# Patient Record
Sex: Male | Born: 1960 | Race: White | Hispanic: No | Marital: Married | Smoking: Current every day smoker
Health system: Southern US, Community
[De-identification: ages and names within clinical notes are randomized; demographics above are authoritative.]

## PROBLEM LIST (undated history)

## (undated) DIAGNOSIS — R51 Headache: Secondary | ICD-10-CM

## (undated) DIAGNOSIS — M720 Palmar fascial fibromatosis [Dupuytren]: Secondary | ICD-10-CM

## (undated) DIAGNOSIS — M47812 Spondylosis without myelopathy or radiculopathy, cervical region: Secondary | ICD-10-CM

## (undated) DIAGNOSIS — I739 Peripheral vascular disease, unspecified: Secondary | ICD-10-CM

## (undated) DIAGNOSIS — E1169 Type 2 diabetes mellitus with other specified complication: Secondary | ICD-10-CM

## (undated) DIAGNOSIS — E440 Moderate protein-calorie malnutrition: Secondary | ICD-10-CM

## (undated) DIAGNOSIS — I6529 Occlusion and stenosis of unspecified carotid artery: Secondary | ICD-10-CM

## (undated) DIAGNOSIS — M797 Fibromyalgia: Secondary | ICD-10-CM

## (undated) DIAGNOSIS — M1811 Unilateral primary osteoarthritis of first carpometacarpal joint, right hand: Secondary | ICD-10-CM

## (undated) DIAGNOSIS — F4310 Post-traumatic stress disorder, unspecified: Secondary | ICD-10-CM

## (undated) DIAGNOSIS — G894 Chronic pain syndrome: Secondary | ICD-10-CM

## (undated) DIAGNOSIS — R413 Other amnesia: Secondary | ICD-10-CM

## (undated) DIAGNOSIS — Z7189 Other specified counseling: Secondary | ICD-10-CM

## (undated) DIAGNOSIS — J189 Pneumonia, unspecified organism: Secondary | ICD-10-CM

## (undated) DIAGNOSIS — S069X9A Unspecified intracranial injury with loss of consciousness of unspecified duration, initial encounter: Secondary | ICD-10-CM

## (undated) DIAGNOSIS — H269 Unspecified cataract: Secondary | ICD-10-CM

## (undated) DIAGNOSIS — C3490 Malignant neoplasm of unspecified part of unspecified bronchus or lung: Secondary | ICD-10-CM

## (undated) DIAGNOSIS — F323 Major depressive disorder, single episode, severe with psychotic features: Secondary | ICD-10-CM

## (undated) DIAGNOSIS — C171 Malignant neoplasm of jejunum: Secondary | ICD-10-CM

## (undated) DIAGNOSIS — Z5111 Encounter for antineoplastic chemotherapy: Secondary | ICD-10-CM

## (undated) DIAGNOSIS — F09 Unspecified mental disorder due to known physiological condition: Secondary | ICD-10-CM

## (undated) DIAGNOSIS — E1159 Type 2 diabetes mellitus with other circulatory complications: Secondary | ICD-10-CM

## (undated) DIAGNOSIS — K2270 Barrett's esophagus without dysplasia: Secondary | ICD-10-CM

## (undated) DIAGNOSIS — E785 Hyperlipidemia, unspecified: Secondary | ICD-10-CM

## (undated) DIAGNOSIS — Z720 Tobacco use: Secondary | ICD-10-CM

## (undated) DIAGNOSIS — N521 Erectile dysfunction due to diseases classified elsewhere: Secondary | ICD-10-CM

## (undated) DIAGNOSIS — R066 Hiccough: Secondary | ICD-10-CM

## (undated) DIAGNOSIS — R519 Headache, unspecified: Secondary | ICD-10-CM

## (undated) DIAGNOSIS — Z5112 Encounter for antineoplastic immunotherapy: Secondary | ICD-10-CM

## (undated) DIAGNOSIS — Z9289 Personal history of other medical treatment: Secondary | ICD-10-CM

## (undated) HISTORY — PX: FEMORAL ARTERY STENT: SHX1583

## (undated) HISTORY — PX: FRACTURE SURGERY: SHX138

## (undated) HISTORY — DX: Chronic pain syndrome: G89.4

## (undated) HISTORY — DX: Moderate protein-calorie malnutrition: E44.0

## (undated) HISTORY — DX: Type 2 diabetes mellitus with other circulatory complications: E11.59

## (undated) HISTORY — DX: Malignant neoplasm of unspecified part of unspecified bronchus or lung: C34.90

## (undated) HISTORY — DX: Unspecified intracranial injury with loss of consciousness of unspecified duration, initial encounter: S06.9X9A

## (undated) HISTORY — DX: Other specified counseling: Z71.89

## (undated) HISTORY — DX: Fibromyalgia: M79.7

## (undated) HISTORY — DX: Post-traumatic stress disorder, unspecified: F43.10

## (undated) HISTORY — DX: Malignant neoplasm of jejunum: C17.1

## (undated) HISTORY — DX: Unspecified cataract: H26.9

## (undated) HISTORY — DX: Type 2 diabetes mellitus with other specified complication: E11.69

## (undated) HISTORY — DX: Hiccough: R06.6

## (undated) HISTORY — DX: Spondylosis without myelopathy or radiculopathy, cervical region: M47.812

## (undated) HISTORY — DX: Palmar fascial fibromatosis (dupuytren): M72.0

## (undated) HISTORY — DX: Unilateral primary osteoarthritis of first carpometacarpal joint, right hand: M18.11

## (undated) HISTORY — DX: Unspecified mental disorder due to known physiological condition: F09

## (undated) HISTORY — DX: Occlusion and stenosis of unspecified carotid artery: I65.29

## (undated) HISTORY — PX: CAROTID STENT: SHX1301

## (undated) HISTORY — DX: Erectile dysfunction due to diseases classified elsewhere: N52.1

## (undated) HISTORY — DX: Encounter for antineoplastic chemotherapy: Z51.11

## (undated) HISTORY — DX: Peripheral vascular disease, unspecified: I73.9

## (undated) HISTORY — DX: Tobacco use: Z72.0

## (undated) HISTORY — DX: Encounter for antineoplastic immunotherapy: Z51.12

## (undated) HISTORY — DX: Hyperlipidemia, unspecified: E78.5

## (undated) HISTORY — DX: Major depressive disorder, single episode, severe with psychotic features: F32.3

## (undated) SURGERY — BRONCHOSCOPY, WITH FLUOROSCOPY
Anesthesia: Moderate Sedation

---

## 1994-01-21 HISTORY — PX: HERNIA REPAIR: SHX51

## 2004-11-20 ENCOUNTER — Encounter: Admission: RE | Admit: 2004-11-20 | Discharge: 2004-11-20 | Payer: Self-pay | Admitting: Rheumatology

## 2010-07-22 DIAGNOSIS — S069X9A Unspecified intracranial injury with loss of consciousness of unspecified duration, initial encounter: Secondary | ICD-10-CM

## 2010-07-22 DIAGNOSIS — S069X1A Unspecified intracranial injury with loss of consciousness of 30 minutes or less, initial encounter: Secondary | ICD-10-CM

## 2010-07-22 HISTORY — DX: Unspecified intracranial injury with loss of consciousness of unspecified duration, initial encounter: S06.9X9A

## 2010-07-22 HISTORY — DX: Unspecified intracranial injury with loss of consciousness of 30 minutes or less, initial encounter: S06.9X1A

## 2010-08-10 DIAGNOSIS — S069X9A Unspecified intracranial injury with loss of consciousness of unspecified duration, initial encounter: Secondary | ICD-10-CM | POA: Insufficient documentation

## 2010-08-15 HISTORY — PX: ORIF ORBITAL FRACTURE: SHX5312

## 2011-04-15 DIAGNOSIS — F09 Unspecified mental disorder due to known physiological condition: Secondary | ICD-10-CM

## 2011-04-15 DIAGNOSIS — F323 Major depressive disorder, single episode, severe with psychotic features: Secondary | ICD-10-CM

## 2011-04-15 HISTORY — DX: Major depressive disorder, single episode, severe with psychotic features: F32.3

## 2011-04-15 HISTORY — DX: Unspecified mental disorder due to known physiological condition: F09

## 2011-07-05 ENCOUNTER — Other Ambulatory Visit: Payer: Self-pay | Admitting: Neurology

## 2011-07-05 DIAGNOSIS — S0990XA Unspecified injury of head, initial encounter: Secondary | ICD-10-CM

## 2011-07-05 DIAGNOSIS — R51 Headache: Secondary | ICD-10-CM

## 2011-10-10 ENCOUNTER — Other Ambulatory Visit: Payer: Self-pay | Admitting: Neurology

## 2011-10-10 DIAGNOSIS — M542 Cervicalgia: Secondary | ICD-10-CM

## 2011-10-10 DIAGNOSIS — IMO0002 Reserved for concepts with insufficient information to code with codable children: Secondary | ICD-10-CM

## 2011-10-15 ENCOUNTER — Ambulatory Visit
Admission: RE | Admit: 2011-10-15 | Discharge: 2011-10-15 | Disposition: A | Discharge status: Home / Self Care | Payer: Worker's Compensation | Source: Ambulatory Visit | Attending: Neurology | Admitting: Neurology

## 2011-10-15 DIAGNOSIS — M542 Cervicalgia: Secondary | ICD-10-CM

## 2011-10-15 DIAGNOSIS — IMO0002 Reserved for concepts with insufficient information to code with codable children: Secondary | ICD-10-CM

## 2011-10-15 MED ORDER — IOHEXOL 300 MG/ML  SOLN
1.0000 mL | Freq: Once | INTRAMUSCULAR | Status: AC | PRN
  Administered 2011-10-15: 1 mL via EPIDURAL

## 2011-10-15 MED ORDER — TRIAMCINOLONE ACETONIDE 40 MG/ML IJ SUSP (RADIOLOGY)
60.0000 mg | Freq: Once | INTRAMUSCULAR | Status: AC
  Administered 2011-10-15: 60 mg via EPIDURAL

## 2011-11-15 HISTORY — PX: HARDWARE REMOVAL: SHX979

## 2013-05-26 ENCOUNTER — Emergency Department (HOSPITAL_COMMUNITY)
Admission: EM | Admit: 2013-05-26 | Discharge: 2013-05-26 | Disposition: A | Discharge status: Home / Self Care | Payer: Medicaid Other | Attending: Emergency Medicine | Admitting: Emergency Medicine

## 2013-05-26 ENCOUNTER — Encounter (HOSPITAL_COMMUNITY): Payer: Self-pay | Admitting: Emergency Medicine

## 2013-05-26 DIAGNOSIS — M545 Low back pain, unspecified: Secondary | ICD-10-CM | POA: Insufficient documentation

## 2013-05-26 DIAGNOSIS — Z8679 Personal history of other diseases of the circulatory system: Secondary | ICD-10-CM | POA: Insufficient documentation

## 2013-05-26 DIAGNOSIS — M79609 Pain in unspecified limb: Secondary | ICD-10-CM | POA: Insufficient documentation

## 2013-05-26 DIAGNOSIS — M549 Dorsalgia, unspecified: Secondary | ICD-10-CM

## 2013-05-26 DIAGNOSIS — Z8719 Personal history of other diseases of the digestive system: Secondary | ICD-10-CM | POA: Insufficient documentation

## 2013-05-26 DIAGNOSIS — F17200 Nicotine dependence, unspecified, uncomplicated: Secondary | ICD-10-CM | POA: Insufficient documentation

## 2013-05-26 DIAGNOSIS — Z8739 Personal history of other diseases of the musculoskeletal system and connective tissue: Secondary | ICD-10-CM | POA: Insufficient documentation

## 2013-05-26 DIAGNOSIS — G8929 Other chronic pain: Secondary | ICD-10-CM | POA: Insufficient documentation

## 2013-05-26 HISTORY — DX: Barrett's esophagus without dysplasia: K22.70

## 2013-05-26 LAB — CBC
HCT: 41.8 % (ref 39.0–52.0)
Hemoglobin: 15 g/dL (ref 13.0–17.0)
MCH: 31.8 pg (ref 26.0–34.0)
MCHC: 35.9 g/dL (ref 30.0–36.0)
MCV: 88.7 fL (ref 78.0–100.0)
Platelets: 256 10*3/uL (ref 150–400)
RBC: 4.71 MIL/uL (ref 4.22–5.81)
RDW: 12.5 % (ref 11.5–15.5)
WBC: 6.7 10*3/uL (ref 4.0–10.5)

## 2013-05-26 LAB — SEDIMENTATION RATE: Sed Rate: 52 mm/hr — ABNORMAL HIGH (ref 0–16)

## 2013-05-26 LAB — CK: Total CK: 40 U/L (ref 7–232)

## 2013-05-26 LAB — I-STAT CG4 LACTIC ACID, ED: Lactic Acid, Venous: 1.36 mmol/L (ref 0.5–2.2)

## 2013-05-26 LAB — COMPREHENSIVE METABOLIC PANEL
ALT: 16 U/L (ref 0–53)
AST: 15 U/L (ref 0–37)
Albumin: 3.7 g/dL (ref 3.5–5.2)
Alkaline Phosphatase: 115 U/L (ref 39–117)
BUN: 20 mg/dL (ref 6–23)
CO2: 23 mEq/L (ref 19–32)
Calcium: 9.4 mg/dL (ref 8.4–10.5)
Chloride: 99 mEq/L (ref 96–112)
Creatinine, Ser: 0.6 mg/dL (ref 0.50–1.35)
GFR calc Af Amer: >90 mL/min (ref >90)
GFR calc non Af Amer: >90 mL/min (ref >90)
Glucose, Bld: 304 mg/dL — ABNORMAL HIGH (ref 70–99)
Potassium: 4.3 mEq/L (ref 3.7–5.3)
Sodium: 135 mEq/L — ABNORMAL LOW (ref 137–147)
Total Bilirubin: 0.5 mg/dL (ref 0.3–1.2)
Total Protein: 7.6 g/dL (ref 6.0–8.3)

## 2013-05-26 MED ORDER — MORPHINE SULFATE 4 MG/ML IJ SOLN
INTRAMUSCULAR | Status: AC
  Filled 2013-05-26: qty 1

## 2013-05-26 MED ORDER — MORPHINE SULFATE 4 MG/ML IJ SOLN
4.0000 mg | Freq: Once | INTRAMUSCULAR | Status: AC
  Administered 2013-05-26: 4 mg via INTRAVENOUS
  Filled 2013-05-26: qty 1

## 2013-05-26 MED ORDER — HYDROCODONE-ACETAMINOPHEN 5-325 MG PO TABS: 1.0000 | ORAL_TABLET | Freq: Four times a day (QID) | ORAL | Status: DC | PRN

## 2013-05-26 NOTE — ED Provider Notes (Signed)
CSN: [id number]     Arrival date & time 05/26/13  1039 History   First MD Initiated Contact with Patient 05/26/13 1130     Chief Complaint  Patient presents with  . Back Pain  . Leg Pain     (Consider location/radiation/quality/duration/timing/severity/associated sxs/prior Treatment) Patient is a 53 y.o. male presenting with back pain. The history is provided by the patient.  Back Pain Location:  Lumbar spine Quality:  Aching and stabbing Radiates to:  L posterior upper leg, R posterior upper leg, L knee, R knee, R thigh and L thigh Pain severity:  Moderate Pain is:  Same all the time Onset quality:  Gradual Timing:  Constant Progression:  Worsening Chronicity:  Chronic Context: not falling and not recent injury   Relieved by:  Nothing Worsened by:  Nothing tried Associated symptoms: no abdominal pain and no fever     Past Medical History  Diagnosis Date  . Barrett's esophagus   . PAD (peripheral artery disease)   . Back pain, chronic   . Arthritis    Past Surgical History  Procedure Laterality Date  . Hernia repair     History reviewed. No pertinent family history. History  Substance Use Topics  . Smoking status: Current Every Day Smoker -- 0.80 packs/day for 35 years    Types: Cigarettes  . Smokeless tobacco: Never Used  . Alcohol Use: No    Review of Systems  Constitutional: Negative for fever.  Respiratory: Negative for cough and shortness of breath.   Gastrointestinal: Negative for vomiting, abdominal pain and diarrhea.  Musculoskeletal: Positive for back pain and myalgias.  All other systems reviewed and are negative.     Allergies  Review of patient's allergies indicates no known allergies.  Home Medications   Prior to Admission medications   Not on File   BP 146/113  Pulse 91  Temp(Src) 97.9 F (36.6 C) (Oral)  Resp 20  SpO2 92% Physical Exam  Nursing note and vitals reviewed. Constitutional: He is oriented to person, place, and time.  He appears well-developed and well-nourished. No distress.  HENT:  Head: Normocephalic and atraumatic.  Mouth/Throat: No oropharyngeal exudate.  Eyes: EOM are normal. Pupils are equal, round, and reactive to light.  Neck: Normal range of motion. Neck supple.  Cardiovascular: Normal rate and regular rhythm.  Exam reveals no friction rub.   No murmur heard. Pulmonary/Chest: Effort normal and breath sounds normal. No respiratory distress. He has no wheezes. He has no rales.  Abdominal: He exhibits no distension. There is no tenderness. There is no rebound.  Musculoskeletal: Normal range of motion. He exhibits tenderness. He exhibits no edema (mild muscular tenderness in muscles of arms, legs).  Neurological: He is alert and oriented to person, place, and time. No cranial nerve deficit. He exhibits normal muscle tone. Coordination normal.  Strength intact  Skin: He is not diaphoretic.    ED Course  Procedures (including critical care time) Labs Review Labs Reviewed  CBC  COMPREHENSIVE METABOLIC PANEL  CK  SEDIMENTATION RATE  I-STAT CG4 LACTIC ACID, ED    Imaging Review No results found.   EKG Interpretation None      MDM   Final diagnoses:  None    53 year old male with chronic back pain presents with worsening back pain. Also presenting with myalgias. Is not on any narcotics at this time. Wife is wanting to know what is causing the back pain, she is not interested in pain meds. The pain has been worsening  over the past 2-3 weeks with inability to move around and get around like he used to. He has seen multiple pain specialists he also has seen providers for claudication in his lower extremities. He had an old C-spine injury that did not require surgery. Here vitals are stable. He is denying any fevers, chest pain or shortness breath, vomiting, diarrhea. He also is denying any urinary incontinence or retention, fecal incontinence retention, perianal numbness or tingling. He does  have some tingling in his bilateral lower extremities, left worse than right. Also some weakness in bilateral lower charities, left worse than right. On exam today he has normal perianal sensation. He has no reflexes in either lower extremity. He has strength but his strength is limited due to pain in bilateral lower extremity.  Sensation is intact. He has decreased strength in bilateral upper extremity. No sensory deficits in any of his extremities whatsoever. He has mild mid lower back pain on exam without any bony deformities. Of note, he also has muscular pain throughout his entire body. He is on a statin. Concern for possible myalgias due to statin use, other systemic problem. Will give her morphine to see if this helps his chronic pain, however his multiple muscular sites of pain and diffuse weakness or concerning for process other than chronic pain. We'll check labs including CK, sedimentation rate Sed rate elevated, otherwise labs ok, CK ok, patient eating, feeling much better. Patient instructed to f/u with his PCP and with the pain clinic. Stable for discharge, given pain medications.  Osvaldo Shipper, MD 05/26/13 [phone]

## 2013-05-26 NOTE — Discharge Instructions (Signed)
Chronic Back Pain   When back pain lasts longer than 3 months, it is called chronic back pain.People with chronic back pain often go through certain periods that are more intense (flare-ups).   CAUSES  Chronic back pain can be caused by wear and tear (degeneration) on different structures in your back. These structures include:   The bones of your spine (vertebrae) and the joints surrounding your spinal cord and nerve roots (facets).   The strong, fibrous tissues that connect your vertebrae (ligaments).  Degeneration of these structures may result in pressure on your nerves. This can lead to constant pain.  HOME CARE INSTRUCTIONS   Avoid bending, heavy lifting, prolonged sitting, and activities which make the problem worse.   Take brief periods of rest throughout the day to reduce your pain. Lying down or standing usually is better than sitting while you are resting.   Take over-the-counter or prescription medicines only as directed by your caregiver.  SEEK IMMEDIATE MEDICAL CARE IF:    You have weakness or numbness in one of your legs or feet.   You have trouble controlling your bladder or bowels.   You have nausea, vomiting, abdominal pain, shortness of breath, or fainting.  Document Released: 02/15/2004 Document Revised: 04/01/2011 Document Reviewed: 12/22/2010  [employer] Patient Information 2014 [employer].

## 2013-05-26 NOTE — ED Notes (Addendum)
Per pt and family pt having back pain since last week and trouble standing. sts legs are weak and painful. And arms and hands hurt. Pt has been started on and given multiple meds from PCP without relief.

## 2013-07-01 ENCOUNTER — Ambulatory Visit (INDEPENDENT_AMBULATORY_CARE_PROVIDER_SITE_OTHER): Payer: Medicaid Other | Admitting: Internal Medicine

## 2013-07-01 ENCOUNTER — Encounter: Payer: Self-pay | Admitting: Internal Medicine

## 2013-07-01 VITALS — BP 128/78 | HR 73 | Temp 97.9°F | Ht 67.0 in | Wt 135.0 lb

## 2013-07-01 DIAGNOSIS — F4310 Post-traumatic stress disorder, unspecified: Secondary | ICD-10-CM

## 2013-07-01 DIAGNOSIS — I739 Peripheral vascular disease, unspecified: Secondary | ICD-10-CM

## 2013-07-01 DIAGNOSIS — E785 Hyperlipidemia, unspecified: Secondary | ICD-10-CM

## 2013-07-01 DIAGNOSIS — N521 Erectile dysfunction due to diseases classified elsewhere: Secondary | ICD-10-CM | POA: Insufficient documentation

## 2013-07-01 DIAGNOSIS — IMO0001 Reserved for inherently not codable concepts without codable children: Secondary | ICD-10-CM

## 2013-07-01 DIAGNOSIS — I6521 Occlusion and stenosis of right carotid artery: Secondary | ICD-10-CM | POA: Insufficient documentation

## 2013-07-01 DIAGNOSIS — M797 Fibromyalgia: Secondary | ICD-10-CM | POA: Insufficient documentation

## 2013-07-01 DIAGNOSIS — I999 Unspecified disorder of circulatory system: Secondary | ICD-10-CM

## 2013-07-01 DIAGNOSIS — F17200 Nicotine dependence, unspecified, uncomplicated: Secondary | ICD-10-CM

## 2013-07-01 DIAGNOSIS — N529 Male erectile dysfunction, unspecified: Secondary | ICD-10-CM

## 2013-07-01 DIAGNOSIS — M47812 Spondylosis without myelopathy or radiculopathy, cervical region: Secondary | ICD-10-CM

## 2013-07-01 DIAGNOSIS — K2270 Barrett's esophagus without dysplasia: Secondary | ICD-10-CM

## 2013-07-01 DIAGNOSIS — I6529 Occlusion and stenosis of unspecified carotid artery: Secondary | ICD-10-CM

## 2013-07-01 DIAGNOSIS — E1169 Type 2 diabetes mellitus with other specified complication: Secondary | ICD-10-CM | POA: Insufficient documentation

## 2013-07-01 DIAGNOSIS — I798 Other disorders of arteries, arterioles and capillaries in diseases classified elsewhere: Secondary | ICD-10-CM

## 2013-07-01 DIAGNOSIS — S069X1A Unspecified intracranial injury with loss of consciousness of 30 minutes or less, initial encounter: Secondary | ICD-10-CM

## 2013-07-01 DIAGNOSIS — Z720 Tobacco use: Secondary | ICD-10-CM

## 2013-07-01 DIAGNOSIS — M542 Cervicalgia: Secondary | ICD-10-CM | POA: Insufficient documentation

## 2013-07-01 DIAGNOSIS — G894 Chronic pain syndrome: Secondary | ICD-10-CM

## 2013-07-01 DIAGNOSIS — IMO0002 Reserved for concepts with insufficient information to code with codable children: Secondary | ICD-10-CM

## 2013-07-01 DIAGNOSIS — E1159 Type 2 diabetes mellitus with other circulatory complications: Secondary | ICD-10-CM | POA: Insufficient documentation

## 2013-07-01 DIAGNOSIS — M792 Neuralgia and neuritis, unspecified: Secondary | ICD-10-CM

## 2013-07-01 DIAGNOSIS — F323 Major depressive disorder, single episode, severe with psychotic features: Secondary | ICD-10-CM

## 2013-07-01 DIAGNOSIS — S069X9A Unspecified intracranial injury with loss of consciousness of unspecified duration, initial encounter: Secondary | ICD-10-CM

## 2013-07-01 HISTORY — DX: Post-traumatic stress disorder, unspecified: F43.10

## 2013-07-01 HISTORY — DX: Type 2 diabetes mellitus with other specified complication: E11.69

## 2013-07-01 HISTORY — DX: Hyperlipidemia, unspecified: E78.5

## 2013-07-01 HISTORY — DX: Occlusion and stenosis of unspecified carotid artery: I65.29

## 2013-07-01 HISTORY — DX: Barrett's esophagus without dysplasia: K22.70

## 2013-07-01 HISTORY — DX: Fibromyalgia: M79.7

## 2013-07-01 HISTORY — DX: Chronic pain syndrome: G89.4

## 2013-07-01 HISTORY — DX: Peripheral vascular disease, unspecified: I73.9

## 2013-07-01 HISTORY — DX: Tobacco use: Z72.0

## 2013-07-01 HISTORY — DX: Type 2 diabetes mellitus with other circulatory complications: E11.59

## 2013-07-01 HISTORY — DX: Erectile dysfunction due to diseases classified elsewhere: N52.1

## 2013-07-01 HISTORY — DX: Spondylosis without myelopathy or radiculopathy, cervical region: M47.812

## 2013-07-01 LAB — LIPID PANEL
Cholesterol: 226 mg/dL — ABNORMAL HIGH (ref 0–200)
HDL: 42 mg/dL (ref >39)
LDL Cholesterol: 129 mg/dL — ABNORMAL HIGH (ref 0–99)
Total CHOL/HDL Ratio: 5.4 Ratio
Triglycerides: 273 mg/dL — ABNORMAL HIGH (ref <150)
VLDL: 55 mg/dL — ABNORMAL HIGH (ref 0–40)

## 2013-07-01 LAB — VITAMIN B12: Vitamin B-12: 318 pg/mL (ref 211–911)

## 2013-07-01 LAB — POCT GLYCOSYLATED HEMOGLOBIN (HGB A1C): Hemoglobin A1C: 11.3

## 2013-07-01 LAB — TSH: TSH: 1.989 u[IU]/mL (ref 0.350–4.500)

## 2013-07-01 MED ORDER — METFORMIN HCL 500 MG PO TABS: 500.0000 mg | ORAL_TABLET | Freq: Two times a day (BID) | ORAL | Status: DC

## 2013-07-01 MED ORDER — CILOSTAZOL 50 MG PO TABS: 50.0000 mg | ORAL_TABLET | Freq: Two times a day (BID) | ORAL | Status: DC

## 2013-07-01 MED ORDER — AMITRIPTYLINE HCL 25 MG PO TABS: 25.0000 mg | ORAL_TABLET | Freq: Every day | ORAL | Status: DC

## 2013-07-01 MED ORDER — GLIPIZIDE 10 MG PO TABS: 10.0000 mg | ORAL_TABLET | Freq: Two times a day (BID) | ORAL | Status: DC

## 2013-07-01 NOTE — Assessment & Plan Note (Signed)
We both decided, given all of the other issues that are more pressing at this time, we will defer his erectile dysfunction management until the return visit. I encourage smoking cessation and control of his lipids. A TSH was obtained today.

## 2013-07-01 NOTE — Assessment & Plan Note (Signed)
His hemoglobin A1c today was 11.3 which is above the target of 7.0. He is on metformin 500 mg by mouth daily and glipizide 5 mg by mouth twice daily. He apparently has been tried on insulin in the past but this resulted in hypoglycemia. I increased his metformin to 500 mg by mouth twice daily and glipizide to 10 mg by mouth twice daily. I also referred him to a diabetic educator. A lipid panel was checked today. A urine albumin was also checked today and is pending at the time of this dictation. We will perform a formal diabetic foot exam at the next visit and inquire if he is up-to-date on his diabetic eye exam. We will check the control of his diabetes with another hemoglobin A1c in 3 months with the above changes.

## 2013-07-01 NOTE — Assessment & Plan Note (Signed)
He is tolerating the simvastatin 40 mg by mouth well. It should be noted that his myalgias and other chronic pain predated the start of simvastatin. Therefore, these symptoms are unlikely to be related to the statin therapy. A lipid panel was checked today and is pending at the time of this dictation. We will make appropriate changes in his statin therapy pending the results of this test.

## 2013-07-01 NOTE — Assessment & Plan Note (Signed)
We will try to obtain the records of the Barrett's esophagus diagnosis. In the meantime, we will continue the omeprazole.

## 2013-07-01 NOTE — Patient Instructions (Addendum)
It was nice to meet you.  I look forward to trying to put your health together.  1) Stop the meloxicam.  2) Start tylenol 1000 mg by mouth 4 times a day.  Take this everyday for 1 month.  If no help, call the clinic and I will try another medication.  3) I started amitriptyline 25 mg by mouth every night.  This is for nerve pain and may make you sleepy.  That is why you should take it at night.  4) Increase the glipizide to 10 mg by mouth twice a day for your diabetes.  5) Increase your metformin to 500 mg by mouth twice a day for your diabetes.  6) I will set up an appointment with the nutritionist to teach you about a diabetic diet.  7) I will refer you for a nerve conduction study to try to sort out your pain.  8) Please get me the records you have from the physicians you have seen or the x-rays/scans you have had done elsewhere.   9) I took some blood and urine tests today.  I will call you if there are any concerning results.  10) Keep taking your other medications as you have.  11) Consider stopping smoking as this is something that you can control that would really help your health.  12) I sent a referral for a diabetic educator to discuss a diabetic diet with you.  I will see you back at my next available appointment but you can call in and be seen by my partners earlier if needed.

## 2013-07-01 NOTE — Assessment & Plan Note (Addendum)
It seems that most of his medical problems began at the point of his closed head injury. His difficulty with concentration, sleep, PTSD, depression, mood swings, loss of smell and taste, and chronic pain syndromes (post traumatic headaches) all can be traced to this event. His wife will try to get some more information about all of the medications, tests, diagnoses and therapies that he received soon after his traumatic brain injury. This should help elucidate what further evaluation is necessary and what therapeutic measures have been tried, or are yet to be tried. Once this information is available for review I will have a clear plan as to what direction in which to go.  What is seen in [place] is that he has been tried on Seroquel, sumatriptan, tizanidine, Topamax, prednisone, Depakote, Remeron, mirtazapine, nuedexta, L-methylfolate, Midrin, frova, and Botox cervical injections as well as the pain medications listed below.

## 2013-07-01 NOTE — Assessment & Plan Note (Signed)
He continues to have what sounds like left lower extremity chronic pain that is not typical of claudication. This pain has not responded to the stenting, for which he remains on the Plavix and 3 weeks of platel. We will continue the platel, Plavix and aspirin. A lipid panel was pending at the time of this dictation, but we will continue the simvastatin 40 mg by mouth daily and adjust as appropriate. Smoking cessation was encouraged. Better diabetic control is imperative and the plan is outlined as above. He has an appointment with his vascular surgeon to check the status of the stents within the next week.

## 2013-07-01 NOTE — Assessment & Plan Note (Addendum)
He qualifies for the diagnosis of fibromyalgia given the number of trigger points that elicited pain. We are starting amitriptyline 25 mg by mouth at night since he has previously failed gabapentin and Lyrica. If this is ineffective we will review other possible therapies for that fibromyalgia although he has already failed the following: Seroquel, sumatriptan, tizanidine, Topamax, prednisone, Depakote, mirtazapine, and nuedexta.

## 2013-07-01 NOTE — Progress Notes (Signed)
Subjective:    Patient ID: Brett [name], male    DOB: 03/25/1960, 53 y.o.   MRN: [id number]  HPI  Mr. Sitter is a 53 year old man who presents as a new patient. 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 This resulted in orbital fractures on the right side of the skull. He subsequently has suffered from PTSD, depression, mood swings, sleep disturbance, cognitive disorder, and generalized pain. He notes pain throughout the body but it is particularly bad in the left leg in a stocking distribution and in the bilateral upper extremities with radiation in the ulnar nerve distribution. He has difficulty characterizing the pain, but notes a throbbing characteristic to it. He has had an extensive workup and post injury medical care, but those records are not available to me at this time. For the pain he has failed ibuprofen, meloxicam, tramadol, gabapentin, Lyrica, 5 mg hydrocodone, and 5 mg oxycodone. He has not tried Tylenol, Naprosyn, salsalate, sulindac, or amitriptyline.  Other aspects of his past medical history include diabetes which is poorly controlled, Barrett's esophagus, peripheral vascular occlusive disease requiring 2 arterial stents above the left knee, carotid artery stenting on the right, and erectile dysfunction.  He has been on multiple medications for his depression and anxiety but these are not currently available. He essentially is looking for a second opinion on his pain issues as well as a general internist to manage all of his medical problems. He is without acute complaints at this time.  Review of Systems  Constitutional: Positive for activity change and fatigue. Negative for fever, chills, diaphoresis, appetite change and unexpected weight change.  Respiratory: Negative for cough, choking, chest tightness, shortness of breath, wheezing and stridor.     Cardiovascular: Negative for chest pain, palpitations and leg swelling.  Gastrointestinal: Positive for constipation. Negative for nausea, vomiting, abdominal pain, diarrhea and abdominal distention.  Musculoskeletal: Positive for arthralgias, back pain, gait problem, myalgias, neck pain and neck stiffness. Negative for joint swelling.  Skin: Negative for color change, pallor, rash and wound.  Allergic/Immunologic: Negative for environmental allergies, food allergies and immunocompromised state.  Neurological: Positive for facial asymmetry and headaches. Negative for dizziness, tremors, seizures, syncope and speech difficulty.  Psychiatric/Behavioral: Positive for confusion, sleep disturbance, dysphoric mood, decreased concentration and agitation. The patient is nervous/anxious.       Objective:   Physical Exam  Nursing note and vitals reviewed. Constitutional: He is oriented to person, place, and time. He appears well-developed and well-nourished. No distress.  HENT:  Head: Normocephalic and atraumatic.  Eyes: Conjunctivae are normal. Right eye exhibits no discharge. Left eye exhibits no discharge. No scleral icterus.  Neck: Normal range of motion. Neck supple.  Cardiovascular: Normal rate, regular rhythm, normal heart sounds and intact distal pulses.  Exam reveals no gallop and no friction rub.   No murmur heard. Pulmonary/Chest: Effort normal and breath sounds normal. No respiratory distress. He has no wheezes. He has no rales. He exhibits no tenderness.  Abdominal: Soft. Bowel sounds are normal. He exhibits no distension. There is no tenderness. There is no rebound and no guarding.  Musculoskeletal: Normal range of motion. He exhibits tenderness. He exhibits no edema.  Neurological: He is alert and oriented to person, place, and time. He exhibits normal muscle tone.  Skin: Skin is warm and dry. No rash noted. He is not diaphoretic. No erythema. No pallor.  Psychiatric: His speech is  normal. Judgment and thought content normal. His mood appears anxious. His affect is blunt. His affect is not angry, not labile and not inappropriate. He is slowed. Cognition and memory are impaired. He exhibits a depressed mood. He exhibits abnormal recent memory and abnormal remote memory.      Assessment & Plan:   Please see problem oriented charting.

## 2013-07-01 NOTE — Assessment & Plan Note (Addendum)
His bilateral upper extremity pain seems neuropathic in nature, specifically in the ulnar nerve. I'm concerned the lesion is central in the cervical spine. His wife will try to get me the imaging records of the cervical spine. In the meantime, we will try amitriptyline 25 mg by mouth at night for this neuropathic pain. His left lower extremity pain is stocking in distribution and suggests it may be secondary to his diabetes, although the fact that it is unilateral is extremely unusual. We will also be using amitriptyline for this process. He was instructed to take Tylenol 1000 mg by mouth 4 times a day, making sure to take no other Tylenol products. If after one month of this therapy his pain is not improved he is to call the clinic for a change in therapy. We will also obtain nerve conduction studies of the left lower extremity and bilateral upper extremities to more specifically address the possibility of a neuropathy. Finally, we will check a TSH and vitamin B12 level to make sure that we do not have an endocrinopathy or vitamin deficiency as the cause of his symptoms.  For the record, he has failed ibuprofen, sulindac, meloxicam, tramadol, gabapentin, Lyrica, oxycodone 5 mg, and hydrocodone 10 mg. He has also been tried on numerous other medications as listed in the closed head injury section above.  Other medications that he has not tried include Naprosyn and salsalate. It is from this list that we will change the Tylenol to should he be unresponsive.

## 2013-07-01 NOTE — Assessment & Plan Note (Signed)
His multiple peripheral vascular processes were linked to smoking. He was encouraged to quit smoking and was offered pharmacologic therapy to assist. He is not quite ready from a mental standpoint at this time. This will therefore be addressed at each followup visit.

## 2013-07-01 NOTE — Assessment & Plan Note (Addendum)
His wife will get me a list of the medications that he has tried in the past. Some of the medications he has used in the past include Topamax, Depakote, mirtazapine, and nuedexta. I suspect this is further exacerbating his chronic pain syndrome.

## 2013-07-02 ENCOUNTER — Encounter: Payer: Self-pay | Admitting: Internal Medicine

## 2013-07-02 DIAGNOSIS — F4310 Post-traumatic stress disorder, unspecified: Secondary | ICD-10-CM | POA: Insufficient documentation

## 2013-07-02 LAB — MICROALBUMIN / CREATININE URINE RATIO
Creatinine, Urine: 83.5 mg/dL
Microalb Creat Ratio: 11.9 mg/g (ref 0.0–30.0)
Microalb, Ur: 0.99 mg/dL (ref 0.00–1.89)

## 2013-07-02 NOTE — Assessment & Plan Note (Signed)
Will start high dose tylenol.  He has failed sumatriptan, tizanidine, Topamax, prednisone, Botox cervical injections as well as the pain medications listed below.

## 2013-07-15 ENCOUNTER — Telehealth: Payer: Self-pay | Admitting: *Deleted

## 2013-07-15 MED ORDER — NAPROXEN 500 MG PO TABS: 500.0000 mg | ORAL_TABLET | Freq: Two times a day (BID) | ORAL | Status: DC

## 2013-07-15 NOTE — Addendum Note (Signed)
Addended by: [name] D on: 07/15/2013 11:11 AM   Modules accepted: Orders

## 2013-07-15 NOTE — Telephone Encounter (Signed)
Wife of pt called after talking to Dr Eppie Gibson  - wanted to check on two things. On the simvastatin pt is taking 20mg  not 40 mg. The second   question - pt sees no change with taking Pletal twice a day. Please call wife on Friday 07/16/13 in AM if possible - she wants to be at home when you call. Hilda Blades Roi Jafari RN 07/15/13 2PM

## 2013-07-15 NOTE — Progress Notes (Addendum)
I received faxes of the MRI results.  They will be scanned into our system, but here are brief summaries:  C-Spine MRI (07/05/11): Mild DJD of C-spine  L-Spine MRI (02/10/12): Mild DJD of L-spine  Brain MRI (07/05/11): Mild left maxillary sinus disease, otherwise unremarkable  Brain MRI (08/16/11): Resolved left maxillary disease, otherwise unremarkable  Microalbumin 0.99 Creatinine 83.5 Microalbumin/Creatinine ratio 11.9  Total cholesterol 226 Triglycerides 273 HDL 42 LDL 129  LDL not at target.  He admits to some inconsistent compliance with the simvastatin.  We will therefore stress the importance of compliance with the simvastatin 40 mg daily and recheck the lipid panel at the follow-up visit.  TSH 1.989  Vitamin B12 318  The tylenol has been ineffective so we will try naprosyn 500 mg twice daily for 2 weeks, to be taken everyday.  If it is ineffective, we will give salsalate a try.  I informed him those are the only medications left in my armamentarium for his pain.  The amitriptyline 25 mg at night made him too sedated.  He is willing to try 12.5 mg at night to see if this helps with the chronic pain, post-traumatic headaches, and insomnia.

## 2013-07-16 NOTE — Telephone Encounter (Signed)
Returned call.  Wife clarified that he was in fact taking 20 mg of simvastatin daily (1/2 tab).  We will continue with this dose and check a lipid panel at the follow-up.  He has received no relief on the pletal after 1-2 months.  I do not believe he has claudication at this time and I was comfortable with this medication being discontinued since he received no symptomatic relief.  Finally, he is nervous about the NCS that was ordered.  That referral was placed before I had access to his MRI results.  With the previously unremarkable LS spine MRI I do not believe the NCS is necessary and I asked that they call to cancel the appointment.  They said that they would.

## 2013-07-28 ENCOUNTER — Encounter: Payer: Self-pay | Admitting: Neurology

## 2013-07-28 ENCOUNTER — Ambulatory Visit: Payer: Self-pay | Admitting: Dietician

## 2013-07-28 NOTE — Addendum Note (Signed)
Addended by: Hulan Fray on: 07/28/2013 06:45 PM   Modules accepted: Orders

## 2013-08-09 ENCOUNTER — Other Ambulatory Visit: Payer: Self-pay | Admitting: Internal Medicine

## 2013-08-09 DIAGNOSIS — E1159 Type 2 diabetes mellitus with other circulatory complications: Secondary | ICD-10-CM

## 2013-10-08 ENCOUNTER — Ambulatory Visit (INDEPENDENT_AMBULATORY_CARE_PROVIDER_SITE_OTHER): Payer: Medicare Other | Admitting: Internal Medicine

## 2013-10-08 ENCOUNTER — Encounter: Payer: Self-pay | Admitting: Internal Medicine

## 2013-10-08 VITALS — BP 117/75 | HR 65 | Temp 98.0°F | Wt 138.4 lb

## 2013-10-08 DIAGNOSIS — K2270 Barrett's esophagus without dysplasia: Secondary | ICD-10-CM

## 2013-10-08 DIAGNOSIS — E785 Hyperlipidemia, unspecified: Secondary | ICD-10-CM | POA: Diagnosis not present

## 2013-10-08 DIAGNOSIS — F17200 Nicotine dependence, unspecified, uncomplicated: Secondary | ICD-10-CM | POA: Diagnosis not present

## 2013-10-08 DIAGNOSIS — M797 Fibromyalgia: Secondary | ICD-10-CM

## 2013-10-08 DIAGNOSIS — I798 Other disorders of arteries, arterioles and capillaries in diseases classified elsewhere: Secondary | ICD-10-CM

## 2013-10-08 DIAGNOSIS — G894 Chronic pain syndrome: Secondary | ICD-10-CM

## 2013-10-08 DIAGNOSIS — S069X1A Unspecified intracranial injury with loss of consciousness of 30 minutes or less, initial encounter: Secondary | ICD-10-CM

## 2013-10-08 DIAGNOSIS — Z720 Tobacco use: Secondary | ICD-10-CM

## 2013-10-08 DIAGNOSIS — I6521 Occlusion and stenosis of right carotid artery: Secondary | ICD-10-CM

## 2013-10-08 DIAGNOSIS — Z0000 Encounter for general adult medical examination without abnormal findings: Secondary | ICD-10-CM

## 2013-10-08 DIAGNOSIS — E1159 Type 2 diabetes mellitus with other circulatory complications: Secondary | ICD-10-CM

## 2013-10-08 DIAGNOSIS — I6529 Occlusion and stenosis of unspecified carotid artery: Secondary | ICD-10-CM

## 2013-10-08 DIAGNOSIS — S069X9A Unspecified intracranial injury with loss of consciousness of unspecified duration, initial encounter: Secondary | ICD-10-CM

## 2013-10-08 DIAGNOSIS — Z23 Encounter for immunization: Secondary | ICD-10-CM

## 2013-10-08 DIAGNOSIS — F333 Major depressive disorder, recurrent, severe with psychotic symptoms: Secondary | ICD-10-CM

## 2013-10-08 DIAGNOSIS — IMO0001 Reserved for inherently not codable concepts without codable children: Secondary | ICD-10-CM

## 2013-10-08 DIAGNOSIS — I739 Peripheral vascular disease, unspecified: Secondary | ICD-10-CM

## 2013-10-08 LAB — POCT GLYCOSYLATED HEMOGLOBIN (HGB A1C): Hemoglobin A1C: 8.3

## 2013-10-08 LAB — GLUCOSE, CAPILLARY: Glucose-Capillary: 159 mg/dL — ABNORMAL HIGH (ref 70–99)

## 2013-10-08 MED ORDER — ATORVASTATIN CALCIUM 40 MG PO TABS: 40.0000 mg | ORAL_TABLET | Freq: Every day | ORAL | Status: DC

## 2013-10-08 MED ORDER — METFORMIN HCL 1000 MG PO TABS: 1000.0000 mg | ORAL_TABLET | Freq: Two times a day (BID) | ORAL | Status: DC

## 2013-10-08 MED ORDER — HYDROCODONE-ACETAMINOPHEN 10-325 MG PO TABS: 1.0000 | ORAL_TABLET | Freq: Four times a day (QID) | ORAL | Status: DC | PRN

## 2013-10-08 NOTE — Assessment & Plan Note (Signed)
He continues to have chronic headaches as well as diffuse chronic pain likely related to his closed head injury. He's been seen by specialists in the [facility] medical system and they've tried numerous medications and therapies. All of these have been unsuccessful in controlling his symptoms. We discussed the fact that all current available therapies have been tried and have been unsuccessful and no other therapies are currently available to help treat the symptoms related to his post injury. He and his wife understand this. We will continue to monitor symptomatically.

## 2013-10-08 NOTE — Assessment & Plan Note (Signed)
He was given a flu vaccination and Tdap today. At the followup visit he will be due for a Pneumovax. We also placed a referral for a screening colonoscopy since this has yet to be done and is due.

## 2013-10-08 NOTE — Assessment & Plan Note (Signed)
He states his depression is slightly improved and would like to hold off on any additional medication at this time. This will be reassessed at the followup visit. If he is interested in any therapy at that time we could consider the addition of an SSRI.

## 2013-10-08 NOTE — Assessment & Plan Note (Signed)
He has had no symptoms suggestive of a right hemispheric stroke. He was switched to a high potency statin today given his cardiovascular risk factors. He is to continue the aspirin 81 mg daily and Plavix daily. We will followup on any symptoms at the return visit.

## 2013-10-08 NOTE — Assessment & Plan Note (Signed)
When his 10 year cardiovascular event risk was calculated using the [facility] population calculator he was found to be 21%. This was reviewed with him and puts him in the category requiring a high potency statin. He was willing to change his simvastatin to atorvastatin given this information. His simvastatin was therefore discontinued and the atorvastatin was started at 40 mg by mouth daily.

## 2013-10-08 NOTE — Assessment & Plan Note (Signed)
The cause of his continuous tearing chest pain is unclear at this time. I believe it is more related to his chronic pain syndrome than to Barrett's esophagus or any complication thereof. His wife will try to get me the EGD records when Barrett's esophagus was diagnosed so that we can arrange appropriate followup. Appropriate release of information forms were provided to the patient's wife and patient. In the meantime, we will continue the omeprazole at 40 mg by mouth daily.

## 2013-10-08 NOTE — Patient Instructions (Signed)
Great to see you again.  You have done a wonderful job with your diabetes!  1) Keep taking the medications as you are with the following changes:  2) Stop the simvastatin.  We are starting atorvastatin 40 mg by mouth daily.  3)  We started the Vicodin 10-325 mg 1 tablet by mouth up to every 8 hours as needed for severe pain.  4) We gave you the tetanus booster and flu shots today.  At the next visit we will consider the Pneumovax.  5) I made a referral for a colonoscopy.  6) Please try to get me the records from the endoscopy that you had for Barrett's esophagus.  7) I drew a blood test to see if the vitamin B12 may actually be low for you.  I will call you if we need to make any changes in your regimen.  8) We made a referral for an eye examination given your diabetes.  9) At the next visit we will talk about stopping smoking again.  I will see you back in 3 months, sooner if necessary.

## 2013-10-08 NOTE — Assessment & Plan Note (Signed)
He continues to have numbness in the left leg. Although this may seem neuropathic in nature this is the leg that he has the peripheral vascular occlusive disease. On exam today his dorsalis pedis pulse was nonpalpable although he did have a palpable posterior tibialis pulse. He was encouraged to quit smoking although is not mentally ready at this time. We have also changed his cholesterol therapy to a high potency statin. His diabetes is much better controlled. We will followup on the control of his vascular risk factors at the return visit.

## 2013-10-08 NOTE — Assessment & Plan Note (Signed)
His hemoglobin A1c today is much improved at 8.3 down from 11.3 3 months ago. This is with an increase in his metformin to 500 mg twice daily and glipizide to 10 mg twice daily. He's had no hypoglycemic episodes although he states he can feel somewhat fatigued when his sugars are in the 110-120 range. I assured him this would improve with time as his body got used to a more normal blood sugar. We will continue the glipizide at 10 mg by mouth twice daily and the metformin will be increased to 1000 mg twice daily. A referral was placed for a diabetic eye exam. A diabetic foot exam was completed today and his microalbuminuria is up-to-date based on last visit's values. He is otherwise up-to-date on his diabetic health care maintenance. A repeat Hgb A1C will be measured at the follow-up visit.

## 2013-10-08 NOTE — Assessment & Plan Note (Signed)
Given his vascular disease his continued tobacco use is concerning. The risks of continued smoking were discussed with him. He is not yet ready to quit mentally. He will continue to think about this over the next 3 months and it will be readdressed at the return visit. We calculated his 10 year cardiovascular event risk with and without smoking and there is a 9% difference. He did find this to be quite enlightening and I hope this helps put his tobacco abuse in perspective with regards to his future health.

## 2013-10-08 NOTE — Assessment & Plan Note (Signed)
There was not enough time in the clinic visit to address this issue with his other active medical problems. We will discuss the erectile dysfunction at the followup visit.

## 2013-10-08 NOTE — Assessment & Plan Note (Signed)
He states the amitriptyline was not effective for his insomnia or fibromyalgia. He therefore self discontinued this medication. With the concern that some of his symptoms may be related to a neuropathy and a borderline low vitamin B12 level at the last visit we decided to obtain an MMA level today to see if, in fact, he did have a B12 deficiency for him that may help explain some of his symptoms and give [place] a path for therapy. We will followup on this result when available and make any additional adjustments in his regimen as appropriate.

## 2013-10-08 NOTE — Assessment & Plan Note (Signed)
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 He's been tried on numerous medications without any relief. The only therapy he has yet to try is salsalate. He did mention that for his tearing chest pain that he had been experiencing he tried hydrocodone and it improved his pain somewhat. Rather than giving salsalate a try, we decided to retry hydrocodone 10 mg-325 mg one tablet every 8 hours as needed for pain, dispense #90 per month. If this is effective in improving his pain we will continue the hydrocodone therapy and obtain a narcotic pain contract at the followup visit.

## 2013-10-08 NOTE — Progress Notes (Signed)
   Subjective:    Patient ID: Brett [name], male    DOB: 17-Jan-1961, 53 y.o.   MRN: [id number]  HPI  Please see the A&P for the status of the pt's chronic medical problems.  Review of Systems  Constitutional: Negative for activity change, appetite change and unexpected weight change.  Respiratory: Negative for cough, chest tightness, shortness of breath, wheezing and stridor.   Cardiovascular: Positive for chest pain. Negative for palpitations and leg swelling.       Chest pain is continuous and tearing in sensation.  It is present when he goes to sleep and still present when he awakes in the morning.  This symptom has been continuing for 2 weeks.  Gastrointestinal: Negative for abdominal pain, diarrhea, constipation and abdominal distention.  Musculoskeletal: Positive for arthralgias, back pain and myalgias. Negative for gait problem and joint swelling.  Skin: Negative for color change, pallor, rash and wound.  Neurological: Positive for headaches.  Psychiatric/Behavioral: Positive for sleep disturbance, dysphoric mood and decreased concentration.       Insomnia is improved.      Objective:   Physical Exam  Nursing note and vitals reviewed. Constitutional: He is oriented to person, place, and time. He appears well-developed and well-nourished. No distress.  HENT:  Head: Normocephalic and atraumatic.  Eyes: Conjunctivae are normal. Right eye exhibits no discharge. Left eye exhibits no discharge. No scleral icterus.  Musculoskeletal: Normal range of motion. He exhibits no edema and no tenderness.  Neurological: He is alert and oriented to person, place, and time. He exhibits normal muscle tone.  Skin: Skin is warm and dry. No rash noted. He is not diaphoretic. No erythema. No pallor.  Psychiatric: His behavior is normal. Judgment and thought content normal. His affect is blunt. His speech is delayed. He exhibits abnormal recent memory.      Assessment & Plan:   Please see problem  oriented charting.

## 2013-10-10 LAB — METHYLMALONIC ACID, SERUM: Methylmalonic Acid, Quant: 103 nmol/L (ref 87–318)

## 2013-10-11 NOTE — Progress Notes (Signed)
MMA level is normal, suggesting the low normal B12 level does not represent a vitamin B 12 deficiency.  I called the patient with the good news.  We will continue care as outlined in the note.

## 2013-11-04 ENCOUNTER — Encounter: Payer: Self-pay | Admitting: Internal Medicine

## 2013-11-12 ENCOUNTER — Encounter: Payer: Self-pay | Admitting: Internal Medicine

## 2013-11-15 ENCOUNTER — Other Ambulatory Visit: Payer: Self-pay | Admitting: *Deleted

## 2013-11-15 DIAGNOSIS — G894 Chronic pain syndrome: Secondary | ICD-10-CM

## 2013-11-15 MED ORDER — HYDROCODONE-ACETAMINOPHEN 10-325 MG PO TABS: 1.0000 | ORAL_TABLET | Freq: Four times a day (QID) | ORAL | Status: DC | PRN

## 2013-11-22 ENCOUNTER — Other Ambulatory Visit: Payer: Self-pay | Admitting: Internal Medicine

## 2013-11-22 DIAGNOSIS — E1159 Type 2 diabetes mellitus with other circulatory complications: Secondary | ICD-10-CM

## 2014-01-07 ENCOUNTER — Ambulatory Visit (INDEPENDENT_AMBULATORY_CARE_PROVIDER_SITE_OTHER): Payer: Medicare Other | Admitting: Internal Medicine

## 2014-01-07 ENCOUNTER — Encounter: Payer: Self-pay | Admitting: Internal Medicine

## 2014-01-07 VITALS — BP 119/76 | HR 59 | Temp 97.7°F | Wt 141.5 lb

## 2014-01-07 DIAGNOSIS — E785 Hyperlipidemia, unspecified: Secondary | ICD-10-CM

## 2014-01-07 DIAGNOSIS — Z23 Encounter for immunization: Secondary | ICD-10-CM

## 2014-01-07 DIAGNOSIS — F333 Major depressive disorder, recurrent, severe with psychotic symptoms: Secondary | ICD-10-CM

## 2014-01-07 DIAGNOSIS — G894 Chronic pain syndrome: Secondary | ICD-10-CM

## 2014-01-07 DIAGNOSIS — E1169 Type 2 diabetes mellitus with other specified complication: Secondary | ICD-10-CM

## 2014-01-07 DIAGNOSIS — N521 Erectile dysfunction due to diseases classified elsewhere: Secondary | ICD-10-CM

## 2014-01-07 DIAGNOSIS — Z720 Tobacco use: Secondary | ICD-10-CM

## 2014-01-07 DIAGNOSIS — E1159 Type 2 diabetes mellitus with other circulatory complications: Secondary | ICD-10-CM

## 2014-01-07 LAB — POCT GLYCOSYLATED HEMOGLOBIN (HGB A1C): Hemoglobin A1C: 7.7

## 2014-01-07 LAB — GLUCOSE, CAPILLARY: Glucose-Capillary: 213 mg/dL — ABNORMAL HIGH (ref 70–99)

## 2014-01-07 MED ORDER — TADALAFIL 20 MG PO TABS: 20.0000 mg | ORAL_TABLET | Freq: Every day | ORAL | Status: DC | PRN

## 2014-01-07 MED ORDER — SALSALATE 750 MG PO TABS: 1500.0000 mg | ORAL_TABLET | Freq: Two times a day (BID) | ORAL | Status: DC

## 2014-01-07 NOTE — Assessment & Plan Note (Signed)
We again discussed the importance of tobacco cessation given his underlying peripheral vascular occlusive disease. He states he is not mentally ready to quit at this time as he enjoys smoking. I offered him pharmacotherapy to assist was tobacco cessation in the future when he is medically ready to quit. We will reassess his readiness at the follow-up visit.

## 2014-01-07 NOTE — Patient Instructions (Addendum)
It was good to see you again.  Good job with your diabetes, it is improving.  1) Today we gave you the pneumonia shot.  2) Keep taking your medications as you are.  3) We will make any changes to your medications depending upon your Hgb A1C test we get this AM.  4) We started Cialis 20 mg daily.  5) We started salsalate 1500 mg twice daily as needed.  6) Think about if you want me to adjust up your diabetes medications and give me a call, otherwise we will discuss this in 3 months.  I will see you back in 3 months, sooner if necessary.

## 2014-01-07 NOTE — Assessment & Plan Note (Signed)
He continues to note feelings of depression. He was again offered an SSRI to pharmacologically address this issue and continues to want to defer pharmacotherapy at this point. We will reassess his depression at the follow-up visit and again offer SSRI therapy if needed.

## 2014-01-07 NOTE — Progress Notes (Signed)
   Subjective:    Patient ID: Brett [name], male    DOB: September 15, 1960, 53 y.o.   MRN: [id number]  HPI  Please see the A&P for the status of the pt's chronic medical problems.  Review of Systems  Constitutional: Negative for activity change, appetite change, fatigue and unexpected weight change.  Respiratory: Negative for shortness of breath and wheezing.   Cardiovascular: Negative for chest pain, palpitations and leg swelling.  Gastrointestinal: Positive for nausea. Negative for vomiting, abdominal pain, diarrhea and constipation.  Musculoskeletal: Positive for myalgias, back pain, arthralgias, neck pain and neck stiffness. Negative for joint swelling and gait problem.  Skin: Negative for color change, rash and wound.  Neurological: Positive for weakness, numbness and headaches. Negative for seizures.       Headaches worsened after taking an alalgesic.  Numbness and weakness of right thumb area.  Psychiatric/Behavioral: Positive for dysphoric mood.      Objective:   Physical Exam  Constitutional: He is oriented to person, place, and time. He appears well-developed and well-nourished. No distress.  HENT:  Head: Normocephalic and atraumatic.  Eyes: Conjunctivae are normal. Right eye exhibits no discharge. Left eye exhibits no discharge. No scleral icterus.  Cardiovascular: Normal rate, regular rhythm and normal heart sounds.  Exam reveals no gallop and no friction rub.   No murmur heard. Pulmonary/Chest: Effort normal and breath sounds normal. No respiratory distress. He has no wheezes. He has no rales.  Abdominal: Soft. Bowel sounds are normal. He exhibits no distension. There is no tenderness. There is no rebound and no guarding.  Musculoskeletal: Normal range of motion. He exhibits tenderness. He exhibits no edema.  Neurological: He is alert and oriented to person, place, and time. He exhibits normal muscle tone.  Skin: Skin is warm and dry. No rash noted. He is not diaphoretic. No  erythema.  Psychiatric: He has a normal mood and affect. His behavior is normal. Judgment and thought content normal.  Nursing note and vitals reviewed.     Assessment & Plan:   Please see problem oriented charting.

## 2014-01-07 NOTE — Assessment & Plan Note (Signed)
We discussed his erectile dysfunction at this visit. In the past he is responded well to Cialis pharmacotherapy. We therefore decided to restart the Cialis at 20 mg by mouth as needed. We will reassess his response to this therapy at the follow-up visit.

## 2014-01-07 NOTE — Assessment & Plan Note (Signed)
His hemoglobin A1c today was 7.7. This is an improvement over 8.3 at the last visit when the metformin was increased to 1000 mg twice daily. He currently is on metformin 1000 mg twice daily and glipizide 10 mg by mouth twice daily. He is tolerating this regimen well although does note some mild stomach upset since increasing the dose of the metformin. We discussed the importance of a little bit better control of his diabetes and I recommended increasing the glipizide to 15 mg by mouth twice daily while maintaining the metformin dose at 1000 mg by mouth twice daily. He would like to think about it and if he decides to increase the glipizide to 15 mg by mouth twice daily he will let me know via the phone. Otherwise will we will reassess his diabetic control with a repeat hemoglobin A1c at the return visit in 3 months. He missed his recent eye examination and will reschedule this. I performed a diabetic foot exam today. Otherwise he is up-to-date on his diabetic health care maintenance.

## 2014-01-07 NOTE — Assessment & Plan Note (Signed)
He will schedule a follow-up colonoscopy with his gastroenterologist, Dr. Alice Reichert. He also received the Prevnar 13 pneumococcal vaccine today. He is otherwise up-to-date on his health care maintenance.

## 2014-01-07 NOTE — Assessment & Plan Note (Signed)
He is tolerating the atorvastatin 40 mg by mouth daily well. Given his cardiovascular risk he requires high intensity therapy and this will be continued given his tolerance.

## 2014-01-07 NOTE — Assessment & Plan Note (Addendum)
The hydrocodone has been somewhat helpful for his pain although has resulted in some analgesic headaches. He therefore takes it sparingly. He will be continued. The only other medication we have yet to try for his pain is salsalate. He was willing to start this today and was given a prescription for 1500 mg by mouth twice daily as needed. We'll reassess his symptomatic response to this addition at the follow-up visit.

## 2014-01-31 ENCOUNTER — Telehealth: Payer: Self-pay | Admitting: *Deleted

## 2014-01-31 NOTE — Telephone Encounter (Signed)
PA request from pt's pharmacy-salsalate 750mg  tabs- request submitted to [facility].  Received the following fax "Medication denied because requested medication is not a Part D eligible medication as defined by the Medicare Part D benefit and is not covered under pt's Part D prescription drug plan"  ([id number])   Request for cialis was also submitted and request was sent for "clinical review"-may take up to 48 hours for a decision.  ([id number])     Member ID# [id number] [phone]  .[name], [name]1/11/20169:22 AM

## 2014-02-01 NOTE — Telephone Encounter (Signed)
Received fax stating cialis was also denied.  Will inform pcp of denial.  Pt has a visit with pcp in March.Despina Hidden Cassady1/12/201611:51 AM

## 2014-02-08 ENCOUNTER — Other Ambulatory Visit: Payer: Self-pay | Admitting: Internal Medicine

## 2014-02-08 DIAGNOSIS — G894 Chronic pain syndrome: Secondary | ICD-10-CM

## 2014-02-08 MED ORDER — HYDROCODONE-ACETAMINOPHEN 10-325 MG PO TABS: 1.0000 | ORAL_TABLET | Freq: Four times a day (QID) | ORAL | Status: DC | PRN

## 2014-03-15 NOTE — Addendum Note (Signed)
Addended by: Hulan Fray on: 03/15/2014 07:37 AM   Modules accepted: Orders

## 2014-04-01 DIAGNOSIS — I6522 Occlusion and stenosis of left carotid artery: Secondary | ICD-10-CM | POA: Diagnosis not present

## 2014-04-01 DIAGNOSIS — I70203 Unspecified atherosclerosis of native arteries of extremities, bilateral legs: Secondary | ICD-10-CM | POA: Diagnosis not present

## 2014-04-01 DIAGNOSIS — Z95820 Peripheral vascular angioplasty status with implants and grafts: Secondary | ICD-10-CM | POA: Diagnosis not present

## 2014-04-07 ENCOUNTER — Telehealth: Payer: Self-pay | Admitting: Internal Medicine

## 2014-04-07 NOTE — Telephone Encounter (Signed)
Call to patient to confirm appointment for 04/08/14 at 10:45 lmtcb

## 2014-04-08 ENCOUNTER — Encounter: Payer: Self-pay | Admitting: Internal Medicine

## 2014-04-08 ENCOUNTER — Ambulatory Visit (INDEPENDENT_AMBULATORY_CARE_PROVIDER_SITE_OTHER): Payer: Medicare Other | Admitting: Internal Medicine

## 2014-04-08 VITALS — BP 117/87 | HR 65 | Temp 97.8°F | Wt 143.0 lb

## 2014-04-08 DIAGNOSIS — E1169 Type 2 diabetes mellitus with other specified complication: Secondary | ICD-10-CM

## 2014-04-08 DIAGNOSIS — M720 Palmar fascial fibromatosis [Dupuytren]: Secondary | ICD-10-CM

## 2014-04-08 DIAGNOSIS — Z0000 Encounter for general adult medical examination without abnormal findings: Secondary | ICD-10-CM

## 2014-04-08 DIAGNOSIS — K2270 Barrett's esophagus without dysplasia: Secondary | ICD-10-CM

## 2014-04-08 DIAGNOSIS — N521 Erectile dysfunction due to diseases classified elsewhere: Secondary | ICD-10-CM

## 2014-04-08 DIAGNOSIS — M47812 Spondylosis without myelopathy or radiculopathy, cervical region: Secondary | ICD-10-CM

## 2014-04-08 DIAGNOSIS — F333 Major depressive disorder, recurrent, severe with psychotic symptoms: Secondary | ICD-10-CM

## 2014-04-08 DIAGNOSIS — E1159 Type 2 diabetes mellitus with other circulatory complications: Secondary | ICD-10-CM

## 2014-04-08 DIAGNOSIS — S069X9A Unspecified intracranial injury with loss of consciousness of unspecified duration, initial encounter: Secondary | ICD-10-CM | POA: Diagnosis not present

## 2014-04-08 DIAGNOSIS — S069X1A Unspecified intracranial injury with loss of consciousness of 30 minutes or less, initial encounter: Secondary | ICD-10-CM

## 2014-04-08 DIAGNOSIS — E1151 Type 2 diabetes mellitus with diabetic peripheral angiopathy without gangrene: Secondary | ICD-10-CM | POA: Diagnosis not present

## 2014-04-08 DIAGNOSIS — Z720 Tobacco use: Secondary | ICD-10-CM

## 2014-04-08 DIAGNOSIS — E785 Hyperlipidemia, unspecified: Secondary | ICD-10-CM

## 2014-04-08 HISTORY — DX: Palmar fascial fibromatosis (dupuytren): M72.0

## 2014-04-08 LAB — GLUCOSE, CAPILLARY: Glucose-Capillary: 197 mg/dL — ABNORMAL HIGH (ref 70–99)

## 2014-04-08 LAB — POCT GLYCOSYLATED HEMOGLOBIN (HGB A1C): Hemoglobin A1C: 8.1

## 2014-04-08 MED ORDER — GLIPIZIDE 10 MG PO TABS: 20.0000 mg | ORAL_TABLET | Freq: Two times a day (BID) | ORAL | Status: DC

## 2014-04-08 MED ORDER — L-METHYLFOLATE-B6-B12 3-35-2 MG PO TABS: 1.0000 | ORAL_TABLET | Freq: Every day | ORAL | Status: DC

## 2014-04-08 NOTE — Assessment & Plan Note (Signed)
He continues to have chronic headaches and pain after his closed head injury. He has been tried on multiple losses of medications without any relief. We will therefore continue to follow symptomatically.

## 2014-04-08 NOTE — Patient Instructions (Addendum)
It was great to see you again.  I am sorry you are not feeling better.  1) L methylfolate-B6-B12 take tablet daily.  2) Increase the glipizide to 20 mg (2 tablets) twice daily.  3) Stop the metformin to see if it is causing your stomach upset.  4) Consider getting the penil pump like we discussed.  5) Keep the other medications the same.  6) Please schedule the eye appointment and colonoscopy.  I will see you back in 3 months, sooner if necessary.

## 2014-04-08 NOTE — Progress Notes (Signed)
   Subjective:    Patient ID: Brett [name], male    DOB: 1960/05/02, 54 y.o.   MRN: [id number]  HPI  Please see the A&P for the status of the pt's chronic medical problems.  Review of Systems  Constitutional: Negative for activity change, appetite change and unexpected weight change.  Respiratory: Negative for chest tightness, shortness of breath and wheezing.   Cardiovascular: Negative for chest pain, palpitations and leg swelling.  Gastrointestinal: Positive for abdominal pain and diarrhea.       Exacerbated with increase of metformin dose from 500 mg twice daily to 1000 mg twice daily.  Has not improved over the last 3 months at this dose.  Musculoskeletal: Positive for back pain and arthralgias. Negative for joint swelling.  Skin: Negative for color change, pallor, rash and wound.  Neurological: Positive for headaches.  Psychiatric/Behavioral: Positive for dysphoric mood and decreased concentration.      Objective:   Physical Exam  Constitutional: He is oriented to person, place, and time. He appears well-developed and well-nourished. No distress.  HENT:  Head: Normocephalic and atraumatic.  Eyes: Conjunctivae are normal. Right eye exhibits no discharge. Left eye exhibits no discharge. No scleral icterus.  Musculoskeletal: Normal range of motion. He exhibits no edema.  Neurological: He is alert and oriented to person, place, and time. He exhibits normal muscle tone.  Skin: He is not diaphoretic.  Psychiatric: He has a normal mood and affect. His behavior is normal. Judgment and thought content normal.  Nursing note and vitals reviewed.     Assessment & Plan:   Please see Problem Oriented Charting.

## 2014-04-08 NOTE — Assessment & Plan Note (Signed)
He is tolerating the atorvastatin 40 mg by mouth daily well without myalgias. Given his vascular disease we will continue with this high intensity statin therapy.

## 2014-04-08 NOTE — Assessment & Plan Note (Signed)
He has not been taking the omeprazole as prescribed. He was encouraged to restart the omeprazole as an acid suppressant given his history of Barrett's esophagus.

## 2014-04-08 NOTE — Assessment & Plan Note (Signed)
He notes bilateral Dupuytren's contractures of the ring fingers. We reviewed available treatment which included a steroid injection and surgery. Given the asymptomatic nature of this we decided to defer such invasive procedures at this time. We will continue to follow his Duputren's contractures longitudinally.

## 2014-04-08 NOTE — Assessment & Plan Note (Signed)
His hemoglobin A1c was 8.1 today. This is up from 7.7 three months ago. He brought his glucose meter in with 14 test results. The average sugar was 175 with the highest being 274 and the lowest being 144. 30% of the readings were above range, 70% were within range, and none were below range. When his metformin dose was increased to 1000 mg twice daily he noted an increase in diarrhea and abdominal discomfort that included bloating. This has continued despite being on the 1000 milligrams of metformin twice daily for more than 3 months. We decided to increase the glipizide from 10 mg twice daily to 20 mg twice daily. At the same time we have discontinued the metformin so we may assess if his diarrhea and abdominal bloating are secondary to this medication. At the follow-up visit we will recheck a hemoglobin A1c to see if it is improved on the increased glipizide dose. We will also determine if the metformin can be restarted. If his symptoms resolve, he has previously tolerated the 500 mg twice daily dose and this will be restarted if necessary. If his symptoms do not resolve despite stopping the metformin than the metformin 1000 mg twice daily can be used in the future in order to help assist glycemic control. He missed his eye examination secondary to the weather recently and will reschedule it. He is otherwise up-to-date on his diabetic health care maintenance.

## 2014-04-08 NOTE — Assessment & Plan Note (Signed)
He was unable to afford the Cialis. He is therefore considering a penile pump. We got online together and found that a model was available through mail order via the [facility] website for $15 + $6 shipping. He and his wife are considering this therapy. If unsuccessful there may be options in the near future for Viagra now that it has recently been approved for generic manufacture.

## 2014-04-08 NOTE — Assessment & Plan Note (Signed)
He continues to smoke and is not interested in quitting at this time. I reminded him that I would be more than happy to provide him with pharmacologic assistance when he is ready. This will be reassessed at the follow-up visit.

## 2014-04-08 NOTE — Assessment & Plan Note (Addendum)
We did not plan on drawing any blood today. Therefore we did not offer the HIV screen. We will offer the HIV screening when he is due for other blood to be drawn. He also did not schedule the colonoscopy with Dr. Alice Reichert. He was encouraged to do so. He is otherwise up-to-date on his preventative health care maintenance.

## 2014-04-08 NOTE — Assessment & Plan Note (Signed)
He has some relief with the Percocet 10-325 mg 1 tablet every 6 hours as needed for pain. We will continue with this therapy.

## 2014-04-08 NOTE — Assessment & Plan Note (Signed)
He continues to have depression but is hesitant to start SSRI therapy. In the past he was on L-methylfolate adjuvant therapy and felt this may have been helpful. He is therefore agreeable to starting the L methylfolate-B6-B12 1 tablet by mouth daily. We will reassess if this has had any effect on his depression at the follow-up visit.

## 2014-04-14 DIAGNOSIS — I739 Peripheral vascular disease, unspecified: Secondary | ICD-10-CM | POA: Diagnosis not present

## 2014-04-14 DIAGNOSIS — I6523 Occlusion and stenosis of bilateral carotid arteries: Secondary | ICD-10-CM | POA: Diagnosis not present

## 2014-05-13 ENCOUNTER — Other Ambulatory Visit: Payer: Self-pay | Admitting: Internal Medicine

## 2014-05-13 DIAGNOSIS — G894 Chronic pain syndrome: Secondary | ICD-10-CM

## 2014-05-13 MED ORDER — HYDROCODONE-ACETAMINOPHEN 10-325 MG PO TABS: 1.0000 | ORAL_TABLET | Freq: Four times a day (QID) | ORAL | Status: DC | PRN

## 2014-07-14 ENCOUNTER — Telehealth: Payer: Self-pay | Admitting: Internal Medicine

## 2014-07-14 NOTE — Telephone Encounter (Signed)
Call to patient to confirm appointment for 07/15/14 at 10:45 lmtcb

## 2014-07-15 ENCOUNTER — Ambulatory Visit (INDEPENDENT_AMBULATORY_CARE_PROVIDER_SITE_OTHER): Payer: Medicare Other | Admitting: Internal Medicine

## 2014-07-15 ENCOUNTER — Encounter: Payer: Self-pay | Admitting: Internal Medicine

## 2014-07-15 VITALS — BP 115/72 | HR 58 | Temp 97.7°F | Ht 67.0 in | Wt 136.3 lb

## 2014-07-15 DIAGNOSIS — E1151 Type 2 diabetes mellitus with diabetic peripheral angiopathy without gangrene: Secondary | ICD-10-CM

## 2014-07-15 DIAGNOSIS — E785 Hyperlipidemia, unspecified: Secondary | ICD-10-CM

## 2014-07-15 DIAGNOSIS — E1159 Type 2 diabetes mellitus with other circulatory complications: Secondary | ICD-10-CM

## 2014-07-15 DIAGNOSIS — N521 Erectile dysfunction due to diseases classified elsewhere: Secondary | ICD-10-CM | POA: Diagnosis not present

## 2014-07-15 DIAGNOSIS — E1169 Type 2 diabetes mellitus with other specified complication: Secondary | ICD-10-CM | POA: Diagnosis not present

## 2014-07-15 DIAGNOSIS — G894 Chronic pain syndrome: Secondary | ICD-10-CM

## 2014-07-15 DIAGNOSIS — F323 Major depressive disorder, single episode, severe with psychotic features: Secondary | ICD-10-CM

## 2014-07-15 DIAGNOSIS — Z0000 Encounter for general adult medical examination without abnormal findings: Secondary | ICD-10-CM

## 2014-07-15 DIAGNOSIS — F333 Major depressive disorder, recurrent, severe with psychotic symptoms: Secondary | ICD-10-CM

## 2014-07-15 DIAGNOSIS — E1165 Type 2 diabetes mellitus with hyperglycemia: Secondary | ICD-10-CM | POA: Diagnosis not present

## 2014-07-15 DIAGNOSIS — K2270 Barrett's esophagus without dysplasia: Secondary | ICD-10-CM

## 2014-07-15 DIAGNOSIS — F17200 Nicotine dependence, unspecified, uncomplicated: Secondary | ICD-10-CM

## 2014-07-15 DIAGNOSIS — Z720 Tobacco use: Secondary | ICD-10-CM

## 2014-07-15 LAB — POCT GLYCOSYLATED HEMOGLOBIN (HGB A1C): Hemoglobin A1C: >14

## 2014-07-15 LAB — GLUCOSE, CAPILLARY: Glucose-Capillary: 390 mg/dL — ABNORMAL HIGH (ref 65–99)

## 2014-07-15 MED ORDER — METFORMIN HCL 500 MG PO TABS: 500.0000 mg | ORAL_TABLET | Freq: Two times a day (BID) | ORAL | Status: DC

## 2014-07-15 MED ORDER — HYDROCODONE-ACETAMINOPHEN 10-325 MG PO TABS: 1.0000 | ORAL_TABLET | Freq: Four times a day (QID) | ORAL | Status: DC | PRN

## 2014-07-15 NOTE — Progress Notes (Signed)
   Subjective:    Patient ID: Brett [name], male    DOB: [birth year], 54 y.o.   MRN: [id number]  HPI  Brett [name] is here for follow-up of diabetes, chronic pain syndrome, and major depression. Please see the A&P for the status of the pt's chronic medical problems.  Review of Systems  Constitutional: Positive for fatigue and unexpected weight change. Negative for activity change and appetite change.  Eyes: Positive for visual disturbance.       Blurry vision.  Respiratory: Negative for cough, chest tightness, shortness of breath and wheezing.   Cardiovascular: Negative for chest pain, palpitations and leg swelling.  Gastrointestinal: Negative for abdominal pain and abdominal distention.  Endocrine: Positive for polydipsia and polyuria.  Genitourinary: Positive for frequency. Negative for hematuria.  Musculoskeletal: Positive for myalgias, back pain and arthralgias. Negative for joint swelling.  Skin: Negative for color change, rash and wound.  Neurological: Positive for headaches. Negative for light-headedness.      Objective:   Physical Exam  Constitutional: He is oriented to person, place, and time. He appears well-developed and well-nourished. No distress.  HENT:  Head: Normocephalic and atraumatic.  Eyes: Conjunctivae are normal. Right eye exhibits no discharge. Left eye exhibits no discharge. No scleral icterus.  Musculoskeletal: Normal range of motion. He exhibits no edema.  Neurological: He is alert and oriented to person, place, and time.  Skin: Skin is warm and dry. No rash noted. He is not diaphoretic. No erythema.  Psychiatric: He has a normal mood and affect. His behavior is normal. Judgment and thought content normal.  Nursing note and vitals reviewed.     Assessment & Plan:   Please see problem oriented charting.

## 2014-07-15 NOTE — Assessment & Plan Note (Signed)
At the last visit we had to stop the metformin because of bloating and loose stools when we did increase the dose to 1000 mg twice daily from 500 mg twice daily. Since stopping the metformin his abdominal bloating and loose stools have resolved. Unfortunately, his blood sugars have been elevated despite increasing the glipizide to 20 mg by mouth twice daily. His hemoglobin A1c in clinic today was greater than 14. He also has had polyuria, polydipsia, blurry vision, and unexplained weight loss likely related to the poorly controlled diabetes. I'm surprised by the marked increase in his hemoglobin A1c with the discontinuance of the metformin, especially in light of the doubling of the glipizide dose at that time. Although I do not expect his hemoglobin A1c to achieve a goal of less than 7 with the reinstitution of metformin I am impressed with his higher response and it is worthy a try to see where his hemoglobin A1c levels out. We will therefore continue the glipizide at 20 mg by mouth twice daily and restart the metformin at the dose he previously tolerated which was 500 mg by mouth twice daily. We will reassess his hemoglobin A1c at the follow-up visit. Depending upon the level we may add an oral agent such as Januvia, or need to switch to insulin therapy. We will also consider diabetic education, in particular diet given his normal BMI, in hopes of getting better control of his diabetes. We will also try to find an eye physician for a diabetic eye examination closer to his home town of [place]. A urine for microalbumin was obtained today. He is otherwise up-to-date on his diabetic health care maintenance.

## 2014-07-15 NOTE — Assessment & Plan Note (Signed)
In the past he has been unable to afford medication such as Viagra or Cialis. At the last clinic visit we discussed purchasing a penile pump through [facility] and found a model that was within his price range. I printed out this information, but they had not gotten around to ordering the penile pump. I encouraged him to consider this option as it was within their budget. We will reassess whether or not they obtain the penile pump at the follow-up visit. In the meantime, we will try to obtain better control of his diabetes.

## 2014-07-15 NOTE — Assessment & Plan Note (Signed)
He continues to smoke and is currently not interested in any help with smoking cessation. We will again discuss the importance of smoking cessation given his peripheral vascular occlusive disease at the follow-up visit and continue to encourage smoking cessation with offers for pharmacologic help.

## 2014-07-15 NOTE — Assessment & Plan Note (Signed)
He is tolerating the atorvastatin 40 mg by mouth daily without any change in his chronic myalgias. We will therefore continue this high intensity statin given his underlying diabetes and peripheral vascular occlusive disease in the setting of continued tobacco abuse.

## 2014-07-15 NOTE — Patient Instructions (Signed)
It was good to see you again.  1) Keep taking the glipizide as you are and add back the metformin at 500 mg twice daily.  2) Keep taking the other medications as you are doing.  3) We gave you some stool cards for screening.  4) Take your Prilosec with your other medications, this may help you remember taking more often.  I will see you in 3 months, sooner if necessary.

## 2014-07-15 NOTE — Assessment & Plan Note (Signed)
He has been forgetting to take his omeprazole on a daily basis as the instructions suggest he should take it 30 minutes before food. This is where he forgets to take it. Rather than strictly meeting this criteria I encouraged him to take it with other medication so at least I could be assured he was getting the PPI therapy in the setting of his underlying Barrett's esophagus. We will reassess his compliance at the follow-up visit.

## 2014-07-15 NOTE — Assessment & Plan Note (Signed)
Although the health maintenance button suggests he is due for a pneumococcal vaccination he received the PCV 13 within the last few months. At the next visit we will offer her the pneumococcal 23 vaccine to complete the series until he is 9. Colonoscopy has not been scheduled and we decided to provide stool cards to assess for blood instead. He and his wife state he will complete this. HIV screening will take place the next time he requires a blood draw. He is otherwise up-to-date on his health care maintenance short of his diabetic eye exam.

## 2014-07-15 NOTE — Assessment & Plan Note (Signed)
He takes the Narco on an as-needed basis with some relief in his pain and an improvement in his function. We therefore will continue the hydrocodone acetaminophen 10-325 mg 1 tablet every 6 hours as needed dispense #90 per month. A urine drug screen was obtained during this visit as he has last taken the hydrocodone last evening. He was given 2 printed prescriptions as it is difficult for him to travel here to pick them up on a monthly basis.

## 2014-07-15 NOTE — Assessment & Plan Note (Signed)
His PHQ-2 today was 0 which is the best it's ever been. He was unable to obtain the L-methylfolate as his insurance company would not pay for it. I encouraged he and his wife to contact the insurance company, and if they wanted to send me paperwork to fill out I be happy to do so, especially since he's not interested in SSRI therapy at this time.

## 2014-07-16 LAB — PRESCRIPTION ABUSE MONITORING 15P, URINE
Amphetamine/Meth: NEGATIVE ng/mL
Barbiturate Screen, Urine: NEGATIVE ng/mL
Benzodiazepine Screen, Urine: NEGATIVE ng/mL
Buprenorphine, Urine: NEGATIVE ng/mL
Cannabinoid Scrn, Ur: NEGATIVE ng/mL
Carisoprodol, Urine: NEGATIVE ng/mL
Cocaine Metabolites: NEGATIVE ng/mL
Creatinine, Urine: 54.75 mg/dL (ref >20.0)
Fentanyl, Ur: NEGATIVE ng/mL
Meperidine, Ur: NEGATIVE ng/mL
Methadone Screen, Urine: NEGATIVE ng/mL
Opiate Screen, Urine: NEGATIVE ng/mL
Oxycodone Screen, Ur: NEGATIVE ng/mL
Propoxyphene: NEGATIVE ng/mL
Tramadol Scrn, Ur: NEGATIVE ng/mL
Zolpidem, Urine: NEGATIVE ng/mL

## 2014-07-16 LAB — MICROALBUMIN / CREATININE URINE RATIO
Creatinine, Urine: 53.3 mg/dL
Microalb Creat Ratio: 11.3 mg/g (ref 0.0–30.0)
Microalb, Ur: 0.6 mg/dL (ref <2.0)

## 2014-07-19 NOTE — Progress Notes (Signed)
Urine microalbumin: 11.3  UDS negative for opiates despite reporting last dose was about 12 hours prior to urine sample collection.  Will reassess UDS at follow-up visit in context of last percocet dose.

## 2014-08-13 ENCOUNTER — Other Ambulatory Visit: Payer: Self-pay | Admitting: Internal Medicine

## 2014-08-13 DIAGNOSIS — E1159 Type 2 diabetes mellitus with other circulatory complications: Secondary | ICD-10-CM

## 2014-10-21 ENCOUNTER — Encounter: Payer: Self-pay | Admitting: Internal Medicine

## 2014-10-21 ENCOUNTER — Ambulatory Visit (HOSPITAL_COMMUNITY)
Admission: RE | Admit: 2014-10-21 | Discharge: 2014-10-21 | Disposition: A | Discharge status: Home / Self Care | Payer: Medicare Other | Source: Ambulatory Visit | Attending: Internal Medicine | Admitting: Internal Medicine

## 2014-10-21 ENCOUNTER — Ambulatory Visit (INDEPENDENT_AMBULATORY_CARE_PROVIDER_SITE_OTHER): Payer: Medicare Other | Admitting: Internal Medicine

## 2014-10-21 VITALS — BP 122/71 | HR 61 | Temp 97.1°F | Wt 139.3 lb

## 2014-10-21 DIAGNOSIS — M1811 Unilateral primary osteoarthritis of first carpometacarpal joint, right hand: Secondary | ICD-10-CM

## 2014-10-21 DIAGNOSIS — M19041 Primary osteoarthritis, right hand: Secondary | ICD-10-CM

## 2014-10-21 DIAGNOSIS — G894 Chronic pain syndrome: Secondary | ICD-10-CM

## 2014-10-21 DIAGNOSIS — E1151 Type 2 diabetes mellitus with diabetic peripheral angiopathy without gangrene: Secondary | ICD-10-CM | POA: Diagnosis not present

## 2014-10-21 DIAGNOSIS — M19042 Primary osteoarthritis, left hand: Secondary | ICD-10-CM | POA: Diagnosis not present

## 2014-10-21 DIAGNOSIS — M47812 Spondylosis without myelopathy or radiculopathy, cervical region: Secondary | ICD-10-CM

## 2014-10-21 DIAGNOSIS — Z720 Tobacco use: Secondary | ICD-10-CM

## 2014-10-21 DIAGNOSIS — Z0000 Encounter for general adult medical examination without abnormal findings: Secondary | ICD-10-CM | POA: Diagnosis not present

## 2014-10-21 DIAGNOSIS — F09 Unspecified mental disorder due to known physiological condition: Secondary | ICD-10-CM

## 2014-10-21 DIAGNOSIS — E1159 Type 2 diabetes mellitus with other circulatory complications: Secondary | ICD-10-CM

## 2014-10-21 DIAGNOSIS — F333 Major depressive disorder, recurrent, severe with psychotic symptoms: Secondary | ICD-10-CM

## 2014-10-21 DIAGNOSIS — Z79899 Other long term (current) drug therapy: Secondary | ICD-10-CM

## 2014-10-21 DIAGNOSIS — K2270 Barrett's esophagus without dysplasia: Secondary | ICD-10-CM

## 2014-10-21 DIAGNOSIS — E785 Hyperlipidemia, unspecified: Secondary | ICD-10-CM

## 2014-10-21 DIAGNOSIS — N521 Erectile dysfunction due to diseases classified elsewhere: Secondary | ICD-10-CM

## 2014-10-21 DIAGNOSIS — E1169 Type 2 diabetes mellitus with other specified complication: Secondary | ICD-10-CM | POA: Diagnosis not present

## 2014-10-21 DIAGNOSIS — M79645 Pain in left finger(s): Secondary | ICD-10-CM | POA: Insufficient documentation

## 2014-10-21 DIAGNOSIS — F17200 Nicotine dependence, unspecified, uncomplicated: Secondary | ICD-10-CM

## 2014-10-21 DIAGNOSIS — I739 Peripheral vascular disease, unspecified: Secondary | ICD-10-CM

## 2014-10-21 DIAGNOSIS — Z7982 Long term (current) use of aspirin: Secondary | ICD-10-CM

## 2014-10-21 HISTORY — DX: Unilateral primary osteoarthritis of first carpometacarpal joint, right hand: M18.11

## 2014-10-21 LAB — HM DIABETES EYE EXAM

## 2014-10-21 LAB — GLUCOSE, CAPILLARY: Glucose-Capillary: 227 mg/dL — ABNORMAL HIGH (ref 65–99)

## 2014-10-21 LAB — POCT GLYCOSYLATED HEMOGLOBIN (HGB A1C): Hemoglobin A1C: 9.5

## 2014-10-21 MED ORDER — HYDROCODONE-ACETAMINOPHEN 10-325 MG PO TABS: 1.0000 | ORAL_TABLET | Freq: Four times a day (QID) | ORAL | Status: DC | PRN

## 2014-10-21 MED ORDER — METFORMIN HCL 500 MG PO TABS: ORAL_TABLET | ORAL | Status: DC

## 2014-10-21 MED ORDER — SILDENAFIL CITRATE 100 MG PO TABS: 100.0000 mg | ORAL_TABLET | ORAL | Status: DC | PRN

## 2014-10-21 MED ORDER — ATORVASTATIN CALCIUM 40 MG PO TABS: 40.0000 mg | ORAL_TABLET | Freq: Every day | ORAL | Status: DC

## 2014-10-21 NOTE — Assessment & Plan Note (Signed)
As noted above the importance of tobacco cessation was again discussed. He is not mentally ready to stop at this time but realizes he can contact me should he change his mind for any pharmacologic aids we have available. We spent greater than 3 minutes counseling him on the importance of tobacco cessation given his underlying chronic medical problems.

## 2014-10-21 NOTE — Assessment & Plan Note (Signed)
He is tolerating the atorvastatin 40 mg by mouth daily without myalgias. We will continue with this therapy. Given recent guidelines that do not to necessitate periodic lipid panels this was not drawn today.

## 2014-10-21 NOTE — Assessment & Plan Note (Signed)
He was unable to send in the stool cards at the last visit. Repeat cards were given to him today. He was also given the annual flu vaccination today. Hepatitis C and HIV were screen for today. Finally, he was scheduled to undergo a diabetic retinal photographic examination today to screen for evidence of a diabetic retinopathy. The results are pending at the time of this dictation.

## 2014-10-21 NOTE — Assessment & Plan Note (Signed)
Mr. Brostrom was much more interactive and engaged at the visit than I've ever seen before. He actually asked questions, made requests outside of his wife's requests, and smiled and joked. At this point I believe his depression is improving and he does not require pharmacologic therapy. We will continue to address his underlying pain which I believe may have contributed to some of his depression symptoms.

## 2014-10-21 NOTE — Assessment & Plan Note (Signed)
He has not been taking his PPI therapy despite [place] giving him permission to take it at any time rather than 30 minutes before meals in hopes of maintaining compliance. Time during this visit was limited and the amount of counseling that could be done on this front was minimal. He was encouraged to take his PPI therapy. This will be readdressed at the follow-up visit.

## 2014-10-21 NOTE — Assessment & Plan Note (Signed)
He noted some point tenderness at the base of his left thumb. There was minimal swelling. 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 He will continue to take the Narco and we will reassess his pain control at the follow-up visit.

## 2014-10-21 NOTE — Assessment & Plan Note (Signed)
His claudication symptoms are unchanged on the aspirin 81 mg by mouth daily and atorvastatin 40 mg by mouth daily. He was once again encouraged to quit smoking given the adverse effects it has on his vascular disease. He is not ready to quit at this time. I asked him to let me know if he were to change his mind as we may be able to treat him pharmacologically to assist in tobacco cessation. Of note, he is quit at least twice before cold [place] but unfortunately restarted.

## 2014-10-21 NOTE — Assessment & Plan Note (Signed)
His chronic pain related to his previous occupational injury is well controlled on the hydrocodone-acetaminophen 10-325 mg 1 tablet every 6 hours as needed for pain, dispense #120 per month. This therapy controls his pain most of the time although occasionally he will have breakthrough. On this therapy he is able to move about the house and address his daily chores which he was unable to do without the Narco. We will therefore continue with his therapy. His last dose was yesterday evening and a UDS is pending at this time. We will reassess his pain control and functionality on this medication at this dose at the follow-up visit.

## 2014-10-21 NOTE — Assessment & Plan Note (Signed)
His diabetic control was much improved today compared to the last visit 3 months ago. His hemoglobin A1c was 9.5 and his meter download suggested an average blood sugar of 190 with the highest being 246 and the lowest being 152. He spent 53% of the readings within the target range. This was while taking glipizide 20 mg by mouth twice daily and metformin 500 mg by mouth twice daily. He's had no further episodes of bloating or loose stool since restarting the metformin. Of note, it was stopping the metformin for the symptoms that resulted in the marked decrease in control of his diabetes. Given his sensitivity to the metformin we decided to increase it to 500 mg by mouth in the morning and 1000 mg by mouth in the evening. We will continue the glipizide 20 mg by mouth twice daily and recheck the hemoglobin A1c at the follow-up visit. If he is tolerating the increased dose of metformin at the follow-up visit we will maximize the dose once again in hopes of getting close to a hemoglobin A1c of 8 which is where we were before. If after maximizing the metformin and the glipizide he remains above target of 7.0 we will consider adding additional agents such as Januvia or insulin.  From a vascular standpoint his symptoms are unchanged with regards to his chronic claudication and pain. We will continue to follow these symptomatically. He will be maintained on lisinopril 5 mg by mouth daily, atorvastatin 40 mg by mouth daily, and aspirin 81 mg by mouth daily. He was counseled regarding smoking cessation.

## 2014-10-21 NOTE — Assessment & Plan Note (Signed)
He was interested in trying Viagra 100 mg for his erectile dysfunction. He understands the costs but felt he could afford 2 tablets to see if this would be effective. We will reassess his response to the high-dose Viagra at the follow-up.

## 2014-10-21 NOTE — Patient Instructions (Addendum)
It was good to see you again.  You are doing better with your diabetes.  Keep up the good work.  1) Increase the metformin to 1 tablet in the morning and 2 tablets in the evening.  2) Keep taking the other medications as you are.  3) We gave you the flu shot today.  4) I will call you next week if there are any concerns with your blood or urine studies.  5) We checked your hand X-ray today to look for arthritis.  I will also call you if it shows anything other than arthritis.  6) We gave you stool cards today.  7) We did your eye photography today.  8) We started viagra 100 mg as needed  I will see you back in 3 months, sooner if necessary.

## 2014-10-21 NOTE — Progress Notes (Signed)
   Subjective:    Patient ID: Brett [name], male    DOB: 1960-05-14, 54 y.o.   MRN: [id number]  HPI  Brett [name] is here for follow-up of his diabetic control and management of his chronic pain. Please see the A&P for the status of the pt's chronic medical problems.  Review of Systems  Constitutional: Negative for activity change and unexpected weight change.  Cardiovascular: Negative for chest pain, palpitations and leg swelling.  Gastrointestinal: Negative for nausea, vomiting and abdominal pain.  Musculoskeletal: Positive for myalgias, back pain, arthralgias and neck pain. Negative for joint swelling.       Diffuse arthralgias.  New left 1st MCP pain at base of thumb.  Diffuse myalgias likely associated more with fibromyalgia.  + neck pain after occupational accident.  Skin: Negative for color change, rash and wound.  Neurological: Positive for numbness and headaches. Negative for seizures, syncope and weakness.       Chronic headaches unchanged after accident.  Left hand numbness is somewhat vague when pressed for details.  Psychiatric/Behavioral: Positive for confusion, dysphoric mood and decreased concentration.       S/p closed head injury with acquired cognitive impairment.  Concentration remains difficult.      Objective:   Physical Exam  Constitutional: He is oriented to person, place, and time. He appears well-developed and well-nourished. No distress.  HENT:  Head: Normocephalic and atraumatic.  Eyes: Conjunctivae are normal. Right eye exhibits no discharge. Left eye exhibits no discharge. No scleral icterus.  Musculoskeletal: Normal range of motion. He exhibits tenderness. He exhibits no edema.  Tenderness to palpation at the base of the left thumb  Neurological: He is alert and oriented to person, place, and time. He exhibits normal muscle tone.  Skin: Skin is warm and dry. No rash noted. He is not diaphoretic. No erythema.  Psychiatric: He has a normal mood and affect.  His behavior is normal. Judgment and thought content normal.  Appeared happier and was more engaged and interactive than I had ever seen him before.  Nursing note and vitals reviewed.     Assessment & Plan:   Please see problem oriented charting.

## 2014-10-22 LAB — BMP8+ANION GAP
Anion Gap: 17 mmol/L (ref 10.0–18.0)
BUN/Creatinine Ratio: 17 (ref 9–20)
BUN: 11 mg/dL (ref 6–24)
CO2: 24 mmol/L (ref 18–29)
Calcium: 8.4 mg/dL — ABNORMAL LOW (ref 8.7–10.2)
Chloride: 99 mmol/L (ref 97–108)
Creatinine, Ser: 0.65 mg/dL — ABNORMAL LOW (ref 0.76–1.27)
GFR calc Af Amer: 126 mL/min/{1.73_m2} (ref >59)
GFR calc non Af Amer: 109 mL/min/{1.73_m2} (ref >59)
Glucose: 207 mg/dL — ABNORMAL HIGH (ref 65–99)
Potassium: 4.4 mmol/L (ref 3.5–5.2)
Sodium: 140 mmol/L (ref 134–144)

## 2014-10-22 LAB — HIV ANTIBODY (ROUTINE TESTING W REFLEX): HIV Screen 4th Generation wRfx: NONREACTIVE

## 2014-10-22 LAB — HCV COMMENT:

## 2014-10-22 LAB — HEPATITIS C ANTIBODY (REFLEX): HCV Ab: <0.1 s/co ratio (ref 0.0–0.9)

## 2014-10-23 LAB — PRESCRIPTION ABUSE MONITORING 17P, URINE
6-Acetylmorphine, Urine: NEGATIVE ng/mL
Amphetamine Scrn, Ur: NEGATIVE ng/mL
BARBITURATE SCREEN URINE: NEGATIVE ng/mL
BENZODIAZEPINE SCREEN, URINE: NEGATIVE ng/mL
Buprenorphine, Urine: NEGATIVE ng/mL
CANNABINOIDS UR QL SCN: NEGATIVE ng/mL
Carisoprodol/Meprobamate, Ur: NEGATIVE ng/mL
Cocaine (Metab) Scrn, Ur: NEGATIVE ng/mL
Creatinine(Crt), U: 53.3 mg/dL (ref 20.0–300.0)
EDDP, Urine: NEGATIVE ng/mL
Fentanyl, Urine: NEGATIVE pg/mL
MDMA Screen, Urine: NEGATIVE ng/mL
Meperidine Screen, Urine: NEGATIVE ng/mL
Methadone Screen, Urine: NEGATIVE ng/mL
Nitrite Urine, Quantitative: NEGATIVE ug/mL
OXYCODONE+OXYMORPHONE UR QL SCN: NEGATIVE ng/mL
Opiate Scrn, Ur: NEGATIVE ng/mL
Ph of Urine: 6.5 (ref 4.5–8.9)
Phencyclidine Qn, Ur: NEGATIVE ng/mL
Propoxyphene Scrn, Ur: NEGATIVE ng/mL
SPECIFIC GRAVITY: 1.008
Tapentadol, Urine: NEGATIVE ng/mL
Tramadol Screen, Urine: NEGATIVE ng/mL

## 2014-10-25 NOTE — Progress Notes (Signed)
UDS was negative, although last dose was the night prior to the sample being provided.  This is the second time this happened.  I will discuss this issue with Brett [name] at the follow-up visit.  Please note that the progress note from the day of the visit lists the #120/month for the hydrocodone.  This is an error, it is #90/month and has always been so.

## 2014-10-28 ENCOUNTER — Encounter: Payer: Self-pay | Admitting: Dietician

## 2014-11-03 NOTE — Addendum Note (Signed)
Addended by: Truddie Crumble on: 11/03/2014 04:17 PM   Modules accepted: Orders

## 2014-12-21 ENCOUNTER — Other Ambulatory Visit: Payer: Self-pay | Admitting: Internal Medicine

## 2014-12-21 DIAGNOSIS — E1159 Type 2 diabetes mellitus with other circulatory complications: Secondary | ICD-10-CM

## 2015-01-06 ENCOUNTER — Ambulatory Visit (INDEPENDENT_AMBULATORY_CARE_PROVIDER_SITE_OTHER): Payer: Medicare Other | Admitting: Internal Medicine

## 2015-01-06 ENCOUNTER — Encounter: Payer: Self-pay | Admitting: Internal Medicine

## 2015-01-06 VITALS — BP 121/90 | HR 63 | Temp 98.1°F | Wt 142.9 lb

## 2015-01-06 DIAGNOSIS — H6990 Unspecified Eustachian tube disorder, unspecified ear: Secondary | ICD-10-CM | POA: Insufficient documentation

## 2015-01-06 DIAGNOSIS — F333 Major depressive disorder, recurrent, severe with psychotic symptoms: Secondary | ICD-10-CM

## 2015-01-06 DIAGNOSIS — Z720 Tobacco use: Secondary | ICD-10-CM

## 2015-01-06 DIAGNOSIS — Z7984 Long term (current) use of oral hypoglycemic drugs: Secondary | ICD-10-CM | POA: Diagnosis not present

## 2015-01-06 DIAGNOSIS — H6980 Other specified disorders of Eustachian tube, unspecified ear: Secondary | ICD-10-CM | POA: Insufficient documentation

## 2015-01-06 DIAGNOSIS — Z0000 Encounter for general adult medical examination without abnormal findings: Secondary | ICD-10-CM

## 2015-01-06 DIAGNOSIS — H6982 Other specified disorders of Eustachian tube, left ear: Secondary | ICD-10-CM

## 2015-01-06 DIAGNOSIS — E1151 Type 2 diabetes mellitus with diabetic peripheral angiopathy without gangrene: Secondary | ICD-10-CM | POA: Diagnosis not present

## 2015-01-06 DIAGNOSIS — E1169 Type 2 diabetes mellitus with other specified complication: Secondary | ICD-10-CM | POA: Diagnosis not present

## 2015-01-06 DIAGNOSIS — F323 Major depressive disorder, single episode, severe with psychotic features: Secondary | ICD-10-CM

## 2015-01-06 DIAGNOSIS — E785 Hyperlipidemia, unspecified: Secondary | ICD-10-CM

## 2015-01-06 DIAGNOSIS — E1159 Type 2 diabetes mellitus with other circulatory complications: Secondary | ICD-10-CM

## 2015-01-06 DIAGNOSIS — K2270 Barrett's esophagus without dysplasia: Secondary | ICD-10-CM

## 2015-01-06 DIAGNOSIS — F17200 Nicotine dependence, unspecified, uncomplicated: Secondary | ICD-10-CM

## 2015-01-06 DIAGNOSIS — H9202 Otalgia, left ear: Secondary | ICD-10-CM

## 2015-01-06 DIAGNOSIS — Z79899 Other long term (current) drug therapy: Secondary | ICD-10-CM

## 2015-01-06 DIAGNOSIS — N521 Erectile dysfunction due to diseases classified elsewhere: Secondary | ICD-10-CM

## 2015-01-06 DIAGNOSIS — I739 Peripheral vascular disease, unspecified: Secondary | ICD-10-CM

## 2015-01-06 DIAGNOSIS — G894 Chronic pain syndrome: Secondary | ICD-10-CM

## 2015-01-06 LAB — POCT GLYCOSYLATED HEMOGLOBIN (HGB A1C): Hemoglobin A1C: 9.6

## 2015-01-06 LAB — GLUCOSE, CAPILLARY: Glucose-Capillary: 228 mg/dL — ABNORMAL HIGH (ref 65–99)

## 2015-01-06 MED ORDER — PANTOPRAZOLE SODIUM 40 MG PO TBEC: 40.0000 mg | DELAYED_RELEASE_TABLET | Freq: Every day | ORAL | Status: DC

## 2015-01-06 MED ORDER — SITAGLIPTIN PHOSPHATE 100 MG PO TABS
100.0000 mg | ORAL_TABLET | Freq: Every day | ORAL | Status: DC
Start: 2015-01-06 — End: 2016-03-12

## 2015-01-06 MED ORDER — HYDROCODONE-ACETAMINOPHEN 10-325 MG PO TABS: 1.0000 | ORAL_TABLET | Freq: Four times a day (QID) | ORAL | Status: DC | PRN

## 2015-01-06 MED ORDER — SILDENAFIL CITRATE 20 MG PO TABS: 100.0000 mg | ORAL_TABLET | Freq: Every day | ORAL | Status: DC | PRN

## 2015-01-06 MED ORDER — METFORMIN HCL 500 MG PO TABS: 500.0000 mg | ORAL_TABLET | Freq: Two times a day (BID) | ORAL | Status: DC

## 2015-01-06 NOTE — Assessment & Plan Note (Signed)
Assessment  Hemoglobin A1c today is 9.6 which is above target and essentially unchanged from 3 months ago. This is despite glipizide 20 mg by mouth twice daily and metformin 1000 mg in the morning and 500 mg in the evening. At the last clinic visit the metformin was increased from 500 twice a day to an extra 500 mg in the morning. With this change Brett [name] developed an upset stomach which Brett [name] had had with higher doses of metformin in the past. It therefore appears that Brett [name] is unable to tolerate more than 500 mg twice daily of the metformin as Brett [name] is failed escalation from this dose twice now. Thus, on maximal glipizide and maximally tolerated metformin Brett [name] is a candidate for an additional medication. Although Brett [name] likely will eventually need insulin Brett [name] has had difficulties with lows in the past when on insulin. Therefore, it is likely Brett [name]'ll be safer with another oral agent.  His peripheral vascular occlusive disease symptoms are stable with occasional exertional claudication and pain.  Plan  We will continue the metformin at 500 mg by mouth twice daily, glipizide 20 mg by mouth twice daily, and add Januvia 100 mg by mouth daily. The importance of compliance was stressed. We will reassess the control of his diabetes on this escalated oral regimen at the follow-up visit.  For his vascular disease we will continue with the aspirin 81 mg by mouth daily, atorvastatin 40 mg by mouth daily and continued aggressive risk factor modification of both his diabetic control as well as encouraging continued contemplation about tobacco cessation.

## 2015-01-06 NOTE — Patient Instructions (Signed)
It was good to see you again.  1) Decrease the metformin to 500 mg twice daily.  2) Start Januvia 100 mg daily for the diabetes.  3) I stopped the omeprazole and started pantoprazole 40 mg daily for your Barrett's esophagus.  4) I prescribed revatio for the erectile dysfunction.  Give the medication a try.  Start with three tablets.  At once.  If that does not work, the next time try 4.  If that does not work the next time try 5.  Do not take more than 5 in a day.  5) We gave you some more stool cards.  Send these in when you can.  6) Let me know if you want any medication help with quitting smoking.  I will see you back in 3 months, sooner if necessary.

## 2015-01-06 NOTE — Progress Notes (Signed)
   Subjective:    Patient ID: Brett [name], male    DOB: [birth year], 54 y.o.   MRN: [id number]  HPI  Brett [name] is here for follow-up of diabetes, depression, and chronic pain. Please see the A&P for the status of the pt's chronic medical problems.  Review of Systems  Constitutional: Negative for activity change, appetite change and unexpected weight change.  HENT: Positive for ear pain and sinus pressure. Negative for hearing loss.        Left ear pain on and off for 2-3 weeks.  No drainage or fevers.  No trauma.  Respiratory: Negative for chest tightness, shortness of breath and wheezing.   Cardiovascular: Negative for chest pain, palpitations and leg swelling.  Gastrointestinal: Negative for nausea, vomiting, abdominal pain, diarrhea and constipation.  Genitourinary:       Erectile dysfunction continues  Musculoskeletal: Positive for myalgias, back pain and arthralgias. Negative for joint swelling.  Skin: Negative for rash and wound.  Neurological: Negative for syncope and weakness.  Psychiatric/Behavioral: Positive for dysphoric mood and decreased concentration. Negative for suicidal ideas and self-injury.      Objective:   Physical Exam  Constitutional: He is oriented to person, place, and time. He appears well-developed and well-nourished. No distress.  HENT:  Head: Normocephalic and atraumatic.  Left Ear: Tympanic membrane, external ear and ear canal normal. No lacerations. No drainage, swelling or tenderness. No foreign bodies. No mastoid tenderness. Tympanic membrane is not injected, not scarred, not perforated, not erythematous, not retracted and not bulging.  No middle ear effusion. No hemotympanum.  Eyes: Conjunctivae are normal. Right eye exhibits no discharge. Left eye exhibits no discharge. No scleral icterus.  Cardiovascular: Normal rate, regular rhythm and normal heart sounds.  Exam reveals no gallop and no friction rub.   No murmur heard. Pulmonary/Chest: Effort  normal and breath sounds normal. No respiratory distress. He has no wheezes. He has no rales.  Abdominal: Soft. Bowel sounds are normal. He exhibits no distension. There is no tenderness. There is no rebound and no guarding.  Musculoskeletal: Normal range of motion. He exhibits no edema or tenderness.  Neurological: He is alert and oriented to person, place, and time. He exhibits normal muscle tone.  Skin: Skin is warm and dry. No rash noted. He is not diaphoretic. No erythema. No pallor.  Psychiatric: Judgment and thought content normal. His affect is blunt. His speech is delayed. He is slowed. Cognition and memory are impaired.  Nursing note and vitals reviewed.     Assessment & Plan:   Please see problem oriented charting.

## 2015-01-06 NOTE — Assessment & Plan Note (Signed)
Assessment  His Barrett's esophagus remains asymptomatic. Unfortunately he has not been taking his PPI therapy. We discussed the importance of PPI therapy and Barrett's esophagus to help prevent progression of the disease to dysplasia or even malignancy.  Plan  We switched the omeprazole to pantoprazole 40 mg by mouth daily. The reason for this change was the fact that he is also on clopidigrel. We will reassess his compliance with this therapy at the follow-up visit.

## 2015-01-06 NOTE — Assessment & Plan Note (Signed)
Assessment  His recent left ear pain is likely related to eustachian tube dysfunction. It is intermittent at this point and I do not believe therapy is required.  Plan  We will reassess for left ear pain and eustachian tube dysfunction at the follow-up visit.

## 2015-01-06 NOTE — Assessment & Plan Note (Signed)
He was unable to provide the fecal occult blood test cards providedat  the last visit. Another set were provided today and he was asked to return them for colon cancer screening. We will consider the second shot in the Pneumovax series once I researched the issue a bit more between now and the next appointment. We will also try to get the hepatitis C test that was obtained at the last visit to be captured in the health maintenance package of [employer].

## 2015-01-06 NOTE — Assessment & Plan Note (Signed)
Assessment  His erectile dysfunction secondary to his diabetes continues. He was unable to afford to Viagra to 100 mg and this we are unable to assess its efficacy.  Plan  We will prescribe sildenafil 100 mg by mouth daily as needed using the revatio brand and which has a coupon available. We will assess the response to this therapy at the follow-up visit.

## 2015-01-06 NOTE — Assessment & Plan Note (Signed)
Assessment  He remains in the pre-contemplative stage and is not interested in smoking cessation at this time.  Plan  We spent more than 3 minutes discussing the benefits of smoking cessation given his underlying diseases. I encouraged him to contact me should he change his mind about smoking cessation and wanted to have pharmacologic assistance. We will reassess where he is with regards to tobacco cessation at the follow-up visit.

## 2015-01-06 NOTE — Assessment & Plan Note (Signed)
Assessment  His chronic pain syndrome remains stable. The hydrocodone is effective in allowing him to participate in some of the daily activities required.  Plan  We will not check a UDS today as his last dose of the hydrocodone was more than 24 hours ago. We wrote 3 prescriptions for the hydrocodone #90 per month and provided them to him to spare him the long drive into the clinic to pick them up on a monthly basis. It is possible that at the next visit we will consider obtaining a UDS. We will reassess his functional status and response to the hydrocodone at the follow-up visit.

## 2015-01-06 NOTE — Assessment & Plan Note (Signed)
Assessment  He continues to have severe depression with a PHQ 9 score of 18 and is not interested in pharmacologic therapy. He also is not interested in seeing a behavioral health specialist. He denies suicidal ideation at this time.  Plan  We will continue to follow clinically. I stressed to him the importance of considering pharmacologic therapy or seeing a behavioral health specialists. I asked he and his wife to contact me should they change their mind and wish to have assessment and therapy for his depression.

## 2015-01-06 NOTE — Assessment & Plan Note (Signed)
Assessment  He is tolerating the atorvastatin well without myalgias.  Plan  We will continue the atorvastatin 40 mg by mouth daily and reassess for intolerances at the follow-up visit.

## 2015-01-06 NOTE — Assessment & Plan Note (Signed)
Assessment  Stable symptomatic claudication with chronic lower extremity pain.  Plan  Continue the aspirin 81 mg by mouth daily, atorvastatin 40 mg by mouth daily, aggressive diabetic control, and encourage smoking cessation. We will reassess his symptomatic peripheral arterial disease at the follow-up visit.

## 2015-04-27 ENCOUNTER — Telehealth: Payer: Self-pay | Admitting: Internal Medicine

## 2015-04-27 NOTE — Telephone Encounter (Signed)
APPT. REMINDER CALL, LMTCB °

## 2015-04-28 ENCOUNTER — Encounter: Payer: Self-pay | Admitting: Internal Medicine

## 2015-04-28 ENCOUNTER — Ambulatory Visit (INDEPENDENT_AMBULATORY_CARE_PROVIDER_SITE_OTHER): Payer: Medicare Other | Admitting: Internal Medicine

## 2015-04-28 VITALS — BP 127/60 | HR 77 | Temp 98.5°F | Wt 140.9 lb

## 2015-04-28 DIAGNOSIS — Z7984 Long term (current) use of oral hypoglycemic drugs: Secondary | ICD-10-CM | POA: Diagnosis not present

## 2015-04-28 DIAGNOSIS — E1169 Type 2 diabetes mellitus with other specified complication: Secondary | ICD-10-CM | POA: Diagnosis not present

## 2015-04-28 DIAGNOSIS — N521 Erectile dysfunction due to diseases classified elsewhere: Secondary | ICD-10-CM

## 2015-04-28 DIAGNOSIS — E1151 Type 2 diabetes mellitus with diabetic peripheral angiopathy without gangrene: Secondary | ICD-10-CM

## 2015-04-28 DIAGNOSIS — Z720 Tobacco use: Secondary | ICD-10-CM

## 2015-04-28 DIAGNOSIS — F17200 Nicotine dependence, unspecified, uncomplicated: Secondary | ICD-10-CM

## 2015-04-28 DIAGNOSIS — I739 Peripheral vascular disease, unspecified: Secondary | ICD-10-CM

## 2015-04-28 DIAGNOSIS — G44329 Chronic post-traumatic headache, not intractable: Secondary | ICD-10-CM

## 2015-04-28 DIAGNOSIS — F323 Major depressive disorder, single episode, severe with psychotic features: Secondary | ICD-10-CM

## 2015-04-28 DIAGNOSIS — G894 Chronic pain syndrome: Secondary | ICD-10-CM

## 2015-04-28 DIAGNOSIS — Z79899 Other long term (current) drug therapy: Secondary | ICD-10-CM

## 2015-04-28 DIAGNOSIS — E1165 Type 2 diabetes mellitus with hyperglycemia: Secondary | ICD-10-CM | POA: Diagnosis not present

## 2015-04-28 DIAGNOSIS — G8929 Other chronic pain: Secondary | ICD-10-CM

## 2015-04-28 DIAGNOSIS — E785 Hyperlipidemia, unspecified: Secondary | ICD-10-CM

## 2015-04-28 DIAGNOSIS — E1159 Type 2 diabetes mellitus with other circulatory complications: Secondary | ICD-10-CM

## 2015-04-28 LAB — GLUCOSE, CAPILLARY: Glucose-Capillary: 163 mg/dL — ABNORMAL HIGH (ref 65–99)

## 2015-04-28 LAB — POCT GLYCOSYLATED HEMOGLOBIN (HGB A1C): Hemoglobin A1C: 8.5

## 2015-04-28 MED ORDER — HYDROCODONE-ACETAMINOPHEN 10-325 MG PO TABS: 1.0000 | ORAL_TABLET | Freq: Four times a day (QID) | ORAL | Status: DC | PRN

## 2015-04-28 NOTE — Patient Instructions (Signed)
It was good to see you today.  I am sorry you are having a very bad headache today.  1) Keep taking your medications as you are.  2) Try to get out and stay active now that the weather is better.  3) Consider cutting back on the [facility].  This is probably one of the bigger problems for your diabetes.  I will see you back in 3 months, sooner if necessary.

## 2015-04-30 NOTE — Assessment & Plan Note (Signed)
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  Plan  A urine tox screen was sent since he took a hydrocodone on the morning of the visit.  It is pending at the time of this dictation.  He was given 3 written prescriptions for the next three months so that he does not need to make the long drive to pick up the written prescriptions on a monthly basis.  If the urine tox screen remains appropriate we will continue with this therapy (#90/month) and reassess his pain and functionality at the follow-up visit.

## 2015-04-30 NOTE — Progress Notes (Signed)
   Subjective:    Patient ID: Brett [name], male    DOB: 1960-08-11, 55 y.o.   MRN: [id number]  HPI  Brett [name] is here for follow-up of his diabetic control, chronic pain, chronic headaches, tobacco abuse, and depression. Please see the A&P for the status of the pt's chronic medical problems.  Review of Systems  Constitutional: Negative for activity change, appetite change and unexpected weight change.  Cardiovascular: Negative for leg swelling.  Genitourinary:       Chronic erectile dysfunction continues.  Musculoskeletal: Positive for myalgias, back pain and arthralgias. Negative for joint swelling and gait problem.  Skin: Negative for color change, rash and wound.  Neurological: Positive for headaches. Negative for dizziness, tremors, seizures, syncope, facial asymmetry, speech difficulty, weakness, light-headedness and numbness.  Psychiatric/Behavioral: Positive for confusion, dysphoric mood and decreased concentration.       Chronic depressed mood and difficulty concentration.      Objective:   Physical Exam  Constitutional: He is oriented to person, place, and time. He appears well-developed and well-nourished. No distress.  HENT:  Head: Normocephalic and atraumatic.  Eyes: Conjunctivae are normal. Right eye exhibits no discharge. Left eye exhibits no discharge. No scleral icterus.  Musculoskeletal: Normal range of motion. He exhibits no edema or tenderness.  Neurological: He is alert and oriented to person, place, and time. He exhibits normal muscle tone. Coordination normal.  Skin: Skin is warm and dry. No rash noted. He is not diaphoretic. No erythema. No pallor.  Psychiatric: He has a normal mood and affect. His behavior is normal. Judgment and thought content normal.  Nursing note and vitals reviewed.     Assessment & Plan:   Please see problem oriented charting.

## 2015-04-30 NOTE — Assessment & Plan Note (Signed)
At the follow-up visit we will offer the pneumovax given he is a diabetic and again discuss the option of stool cards as a method of screening for colon cancer as he has yet to turn in the stool cards provided to him.  The hepatitis C screen has been completed, EPIC simply is not picking this up and a message was sent to the appropriate individual to get this glitch corrected so the inappropriate alert does not continue to appear.

## 2015-04-30 NOTE — Assessment & Plan Note (Signed)
Assessment  He is tolerating the high intensity statin without myalgias.  Plan  We will continue the atorvastatin at 40 mg daily and reassess for intolerances at the follow-up visit.

## 2015-04-30 NOTE — Assessment & Plan Note (Signed)
Assessment  He remains with evidence of depression on the PHQ-2 and also remains uninterested in pharmacologic therapy for his depression.  Plan  He was asked to contact me if he changes his mind about pharmacologic intervention for his depression.  It is my opinion he would benefit from an SSRI.  We will reassess for depression at the follow-up visit.

## 2015-04-30 NOTE — Assessment & Plan Note (Signed)
Assessment  Diabetic control is not yet at target.  He is tolerating the metformin 500 mg twice daily, glipizide 20 mg twice daily, and Januvia 100 mg daily but his Hgb A1C is 8.5, down from 9.6 four months ago.  He is more active with the improvement of the weather but continues to eat a lot of starches (potatoes and rice) and drink a lot of [facility].  His wife is aware of the issue with the starches and we may need to ask her to decrease making potatoes and rice if he is unable to improve his Hgb A1C and control his portion sizes.  His associated vascular disease is symptomatically stable on his current regimen.  Plan  Continue the glipizide, metformin, and Januvia at the current doses.  He was counseled on the importance of cutting back on the [facility] as this one step can have a considerable impact on the control of his diabetes.  He was resistant to water, flavored water, and diet mountain dew so I am concerned this change will be difficult and likely not pursued.  With regards to his vascular disease we will continue the ASA,. Plavix, atorvastatin 40 mg and work on risk factor modification and assess for progression of claudication at the follow-up visit.

## 2015-04-30 NOTE — Assessment & Plan Note (Signed)
Assessment  Erectile dysfunction continues and the revatio was ineffective.  He remains uninterested in alternatives to include alprostadil penile suppositories secondary to cost or penile pumps.  Plan  Encouraged him to consider a penile pump if the issue remains important to he and his wife.  We will reassess if he tried this option at the follow-up visit.

## 2015-04-30 NOTE — Assessment & Plan Note (Signed)
Assessment  Stable on his current regimen of ASA, Plavix, and atorvastatin 40 mg daily.  Diabetic control remains an issue as does the continued tobacco abuse.  Plan  Continue the ASA, Plavix, high intensity statin and attempts at risk factor modification.  We will reassess symptoms at the follow-up.

## 2015-04-30 NOTE — Assessment & Plan Note (Signed)
Assessment  He continues to smoke and remains in the pre contemplative phase of tobacco cessation.    Plan  More than 3 minutes was spent counseling him on the benefits of tobacco cessation given his underlying medical problems.  He was asked to contact me if he changes his mind and would like pharmacologic therapy to assist with attempts at tobacco cessation.

## 2015-05-01 DIAGNOSIS — I6523 Occlusion and stenosis of bilateral carotid arteries: Secondary | ICD-10-CM | POA: Diagnosis not present

## 2015-05-01 DIAGNOSIS — I70203 Unspecified atherosclerosis of native arteries of extremities, bilateral legs: Secondary | ICD-10-CM | POA: Diagnosis not present

## 2015-05-01 DIAGNOSIS — Z95820 Peripheral vascular angioplasty status with implants and grafts: Secondary | ICD-10-CM | POA: Diagnosis not present

## 2015-05-04 LAB — TOXASSURE SELECT,+ANTIDEPR,UR: PDF: 0

## 2015-05-15 NOTE — Progress Notes (Signed)
UDS is positive for the appropriate opiates.  No red flags at this time.  Will continue periodic random UDS to assure they remain appropriate.

## 2015-06-16 ENCOUNTER — Other Ambulatory Visit: Payer: Self-pay | Admitting: Internal Medicine

## 2015-06-20 ENCOUNTER — Other Ambulatory Visit: Payer: Self-pay | Admitting: Internal Medicine

## 2015-06-20 DIAGNOSIS — E1159 Type 2 diabetes mellitus with other circulatory complications: Secondary | ICD-10-CM

## 2015-06-27 DIAGNOSIS — M5432 Sciatica, left side: Secondary | ICD-10-CM | POA: Diagnosis not present

## 2015-06-27 DIAGNOSIS — I6523 Occlusion and stenosis of bilateral carotid arteries: Secondary | ICD-10-CM | POA: Diagnosis not present

## 2015-06-27 DIAGNOSIS — I739 Peripheral vascular disease, unspecified: Secondary | ICD-10-CM | POA: Diagnosis not present

## 2015-06-27 DIAGNOSIS — E1142 Type 2 diabetes mellitus with diabetic polyneuropathy: Secondary | ICD-10-CM | POA: Diagnosis not present

## 2015-07-19 ENCOUNTER — Other Ambulatory Visit: Payer: Self-pay | Admitting: *Deleted

## 2015-07-19 DIAGNOSIS — G894 Chronic pain syndrome: Secondary | ICD-10-CM

## 2015-07-20 MED ORDER — HYDROCODONE-ACETAMINOPHEN 10-325 MG PO TABS: 1.0000 | ORAL_TABLET | Freq: Four times a day (QID) | ORAL | Status: DC | PRN

## 2015-08-04 DIAGNOSIS — I700 Atherosclerosis of aorta: Secondary | ICD-10-CM | POA: Diagnosis not present

## 2015-08-15 ENCOUNTER — Other Ambulatory Visit: Payer: Self-pay | Admitting: Internal Medicine

## 2015-08-15 DIAGNOSIS — E1159 Type 2 diabetes mellitus with other circulatory complications: Secondary | ICD-10-CM

## 2015-08-25 ENCOUNTER — Encounter: Payer: Self-pay | Admitting: Internal Medicine

## 2015-08-25 ENCOUNTER — Other Ambulatory Visit: Payer: Self-pay | Admitting: Dietician

## 2015-08-25 ENCOUNTER — Ambulatory Visit (INDEPENDENT_AMBULATORY_CARE_PROVIDER_SITE_OTHER): Payer: Medicare Other | Admitting: Internal Medicine

## 2015-08-25 ENCOUNTER — Encounter (INDEPENDENT_AMBULATORY_CARE_PROVIDER_SITE_OTHER): Payer: Self-pay

## 2015-08-25 ENCOUNTER — Ambulatory Visit (HOSPITAL_COMMUNITY)
Admission: RE | Admit: 2015-08-25 | Discharge: 2015-08-25 | Disposition: A | Discharge status: Home / Self Care | Payer: Medicare Other | Source: Ambulatory Visit | Attending: Internal Medicine | Admitting: Internal Medicine

## 2015-08-25 VITALS — BP 120/86 | HR 63 | Temp 98.2°F | Wt 137.4 lb

## 2015-08-25 DIAGNOSIS — Z720 Tobacco use: Secondary | ICD-10-CM

## 2015-08-25 DIAGNOSIS — E1151 Type 2 diabetes mellitus with diabetic peripheral angiopathy without gangrene: Secondary | ICD-10-CM | POA: Diagnosis not present

## 2015-08-25 DIAGNOSIS — M50322 Other cervical disc degeneration at C5-C6 level: Secondary | ICD-10-CM | POA: Diagnosis not present

## 2015-08-25 DIAGNOSIS — R918 Other nonspecific abnormal finding of lung field: Secondary | ICD-10-CM | POA: Diagnosis not present

## 2015-08-25 DIAGNOSIS — R911 Solitary pulmonary nodule: Secondary | ICD-10-CM | POA: Diagnosis not present

## 2015-08-25 DIAGNOSIS — E1159 Type 2 diabetes mellitus with other circulatory complications: Secondary | ICD-10-CM

## 2015-08-25 DIAGNOSIS — M79602 Pain in left arm: Secondary | ICD-10-CM | POA: Insufficient documentation

## 2015-08-25 DIAGNOSIS — F17210 Nicotine dependence, cigarettes, uncomplicated: Secondary | ICD-10-CM | POA: Diagnosis not present

## 2015-08-25 DIAGNOSIS — G894 Chronic pain syndrome: Secondary | ICD-10-CM | POA: Insufficient documentation

## 2015-08-25 DIAGNOSIS — M47812 Spondylosis without myelopathy or radiculopathy, cervical region: Secondary | ICD-10-CM | POA: Diagnosis not present

## 2015-08-25 DIAGNOSIS — Z7984 Long term (current) use of oral hypoglycemic drugs: Secondary | ICD-10-CM | POA: Diagnosis not present

## 2015-08-25 DIAGNOSIS — Z87820 Personal history of traumatic brain injury: Secondary | ICD-10-CM

## 2015-08-25 DIAGNOSIS — Z79891 Long term (current) use of opiate analgesic: Secondary | ICD-10-CM

## 2015-08-25 DIAGNOSIS — C3491 Malignant neoplasm of unspecified part of right bronchus or lung: Secondary | ICD-10-CM | POA: Insufficient documentation

## 2015-08-25 LAB — GLUCOSE, CAPILLARY: Glucose-Capillary: 251 mg/dL — ABNORMAL HIGH (ref 65–99)

## 2015-08-25 LAB — POCT URINALYSIS DIPSTICK
Blood, UA: NEGATIVE
Leukocytes, UA: NEGATIVE
Nitrite, UA: NEGATIVE
Protein, UA: NEGATIVE
Spec Grav, UA: 1.025
Urobilinogen, UA: 1
pH, UA: 5

## 2015-08-25 LAB — POCT GLYCOSYLATED HEMOGLOBIN (HGB A1C): Hemoglobin A1C: 7.9

## 2015-08-25 MED ORDER — PREGABALIN 50 MG PO CAPS: 50.0000 mg | ORAL_CAPSULE | Freq: Three times a day (TID) | ORAL | 11 refills | Status: DC

## 2015-08-25 MED ORDER — HYDROCODONE-ACETAMINOPHEN 10-325 MG PO TABS: 1.0000 | ORAL_TABLET | Freq: Four times a day (QID) | ORAL | 0 refills | Status: DC | PRN

## 2015-08-25 NOTE — Assessment & Plan Note (Signed)
Assessment  As noted above Brett [name] came in today complaining of new onset left shoulder pain. Physical examination was unremarkable although the symptoms were concerning for some neuropathic pain. A chest x-ray was obtained given his tobacco abuse but no left apical mass was found. Instead, he was found to have a right upper lobe nodule. This requires further assessment with a CT scan to get a better idea of the nature of the nodule, the border characteristics, and any associated parenchymal or mediastinal abnormalities.  Plan  A CT scan of the chest with contrast has been ordered. We are awaiting pre-authorization from his insurance company to workup this right upper lobe nodule seen on chest x-ray. He has been informed of this finding and is aware that he will receive a phone call to schedule an appointment for the CT scan of the chest once his insurance company approves it. The results of this study will be followed up and decisions for further evaluation or therapy will be decided once reviewed.

## 2015-08-25 NOTE — Assessment & Plan Note (Signed)
Assessment  Brett [name] remains in the pre-contemplative stage and is not interested in any medications for smoking cessation. He states when he is ready he will quit cold [place]. The health benefits of smoking cessation, particularly given his diabetes and perform vascular occlusive disease were again reviewed.  Plan  At the follow-up visit we will see if he is mentally ready for attempt at smoking cessation and interested in any pharmacotherapy help. Now that he has a new right upper lobe nodule he may have further motivation to consider making a run at smoking cessation.

## 2015-08-25 NOTE — Telephone Encounter (Signed)
Called patient's pharmacy as part of project trying to help patient's with a1C between 8-9% lower their blood sugars to target.  Per the [facility] pharmacist, he has not gotten consistent refills of his diabetes medicines (metformin,glipizide or Januvia) in the past year, picking up a 90 day refill about every 4-6 months.

## 2015-08-25 NOTE — Assessment & Plan Note (Signed)
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 In the interim, he is developed the acute onset of left anterior chest pain shoulder pain arm pain and neck pain. The description his neuropathic in etiology. He has had suicidal ideations with gabapentin but is willing to give Lyrica a try. X-rays of the shoulder and cervical spine were unremarkable. He has underlying cervical spine osteoarthritis and degenerative disc disease that this was unchanged from the prior study. A chest x-ray failed to reveal a left apical mass but did reveal a right upper lobe nodule that is being addressed as outlined above.  Plan  We will start Lyrica at 50 mg by mouth daily. He will increase it by 50 mg a day per week until he reaches 50 mg 3 times a day. If he does not tolerate a particular level he was asked to decrease back to the level of Lyrica which he did tolerate. We will reassess the efficacy of this therapy on his neuropathic symptoms at the return visit.

## 2015-08-25 NOTE — Assessment & Plan Note (Signed)
Assessment  Although less than ideal his hemoglobin A1c suggests his diabetes is under better control. It was 7.9 today and prior to that it was 8.5. Before that it was 9.6. The pharmacy was called and it appears that he is getting 3 month supplies every 5 months and this suggests that he's missing nearly half of the doses of his medication. 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 The importance of getting a pillbox and having family help him was stressed. I discussed this with his wife as well. I fully believe that he can get excellent control of his diabetes if he were compliant with his medications as prescribed.  Plan  His family will try to assist him with filling out a pillbox to help him remember when he needs to take the afternoon doses of his oral anti-hyperglycemics. We will reassess his diabetic control with a repeat hemoglobin A1c at the follow-up visit. His current oral hyperglycemics include glipizide 20 mg by mouth twice daily, metformin 500 mg by mouth twice daily, and Januvia 100 mg by mouth daily.

## 2015-08-25 NOTE — Progress Notes (Signed)
   Subjective:    Patient ID: Brett [name], male    DOB: April 29, 1960, 55 y.o.   MRN: [id number]  HPI  Brett [name] is here for follow-up of his diabetes, chronic pain, and tobacco abuse. Please see the A&P for the status of the pt's chronic medical problems.  He presents today with a new complaint of left chest shoulder and arm pain. The pain began approximately 1 month ago and there was no noticeable precipitating event. It was a sharp pain that started just under the anterior aspect of the left axilla extended up to the left shoulder and down the left arm to the elbow. Occasionally the pain would shoot a sharp stinging numbing sensation down the left arm to the fingertips. There was no particular activity or position that would precipitate this pain. The pain is described as constant. It is not relieved by his narcotic pain medication or Flexeril that he takes from his wife. He denies any visual changes or lack of sweating on one side of the face, particularly the left side. There is no weakness and no other skin changes.  Review of Systems  Constitutional: Negative for activity change, appetite change, fever and unexpected weight change.  HENT: Negative for trouble swallowing.   Respiratory: Negative for chest tightness, shortness of breath, wheezing and stridor.   Cardiovascular: Positive for chest pain. Negative for palpitations and leg swelling.  Gastrointestinal: Negative for abdominal distention and abdominal pain.  Genitourinary: Positive for difficulty urinating, frequency and urgency. Negative for enuresis, flank pain and hematuria.  Musculoskeletal: Positive for arthralgias, back pain, myalgias, neck pain and neck stiffness. Negative for gait problem and joint swelling.  Skin: Negative for color change, pallor, rash and wound.  Neurological: Positive for numbness and headaches. Negative for tremors, seizures, syncope, facial asymmetry, speech difficulty and weakness.  Hematological:  Negative for adenopathy. Does not bruise/bleed easily.  Psychiatric/Behavioral: Positive for confusion, decreased concentration and dysphoric mood.      Objective:   Physical Exam  Constitutional: He is oriented to person, place, and time. He appears well-developed and well-nourished. No distress.  HENT:  Head: Normocephalic and atraumatic.  Eyes: Conjunctivae are normal. Pupils are equal, round, and reactive to light. Right eye exhibits no discharge. Left eye exhibits no discharge. No scleral icterus.  Neck: Normal range of motion. Neck supple.  Cardiovascular: Normal rate, regular rhythm and normal heart sounds.  Exam reveals no gallop and no friction rub.   No murmur heard. Pulmonary/Chest: Effort normal and breath sounds normal. No stridor. No respiratory distress. He has no wheezes. He has no rales. He exhibits no tenderness.  Abdominal: Soft. Bowel sounds are normal. He exhibits no distension. There is no tenderness. There is no rebound and no guarding.  Musculoskeletal: Normal range of motion. He exhibits no edema, tenderness or deformity.  Lymphadenopathy:    He has no cervical adenopathy.  Neurological: He is alert and oriented to person, place, and time. He exhibits normal muscle tone.  Skin: Skin is warm and dry. No rash noted. He is not diaphoretic. No erythema.  Psychiatric: He has a normal mood and affect. His behavior is normal. Judgment and thought content normal.  Nursing note and vitals reviewed.     Assessment & Plan:   Please see problem based charting.

## 2015-08-25 NOTE — Patient Instructions (Signed)
It was great to see you again.  Your diabetes is better controlled!  1) Try to quit smoking.  2) I will let you know about the results of the X-rays.  3) We will start the Lyrica for the nerve pain down the left arm.  We will start very low doses.  Take it as follows:  Lyrica 50 mg by mouth once daily  Lyrica 50 mg by mouth twice daily  Lyrica 50 mg by mouth three times daily  4) Try to take your medications as prescribed.  I think your diabetes will be better controlled with this.  I will see you in 3 months, sooner if necessary.

## 2015-09-01 ENCOUNTER — Ambulatory Visit (HOSPITAL_COMMUNITY)
Admission: RE | Admit: 2015-09-01 | Discharge: 2015-09-01 | Disposition: A | Discharge status: Home / Self Care | Payer: Medicare Other | Source: Ambulatory Visit | Attending: Internal Medicine | Admitting: Internal Medicine

## 2015-09-01 ENCOUNTER — Encounter (HOSPITAL_COMMUNITY): Payer: Self-pay

## 2015-09-01 ENCOUNTER — Telehealth: Payer: Self-pay | Admitting: Internal Medicine

## 2015-09-01 DIAGNOSIS — R918 Other nonspecific abnormal finding of lung field: Secondary | ICD-10-CM | POA: Diagnosis not present

## 2015-09-01 DIAGNOSIS — R911 Solitary pulmonary nodule: Secondary | ICD-10-CM | POA: Diagnosis not present

## 2015-09-01 LAB — POCT I-STAT CREATININE: Creatinine, Ser: 0.7 mg/dL (ref 0.61–1.24)

## 2015-09-01 MED ORDER — IOPAMIDOL (ISOVUE-300) INJECTION 61%
INTRAVENOUS | Status: AC
  Administered 2015-09-01: 75 mL
  Filled 2015-09-01: qty 75

## 2015-09-01 NOTE — Telephone Encounter (Signed)
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 Also there is right hilar lymph node suspicious for metastatic nodal disease. Recommend thoracic surgery consultation and PET-CT.   Will forward the result to Dr. Eppie Gibson.

## 2015-09-05 NOTE — Telephone Encounter (Signed)
I was able to reach Brett [name] and I spoke with them about the results.  He states he had a pneumonia in about 2005 but is unsure what side.  I was able to pull a film from 11/20/2004 which did not reveal the RUL lesion at that time.  They are interested in referral to pulmonary for consideration of a bronchoscopy with TBBx. This referral will be placed.

## 2015-09-05 NOTE — Telephone Encounter (Signed)
I have called Brett [name] multiple times over the last 2 days and received his wife's voice mail.  I have left several messages to call the clinic when they get this message so I could speak with Brett [name] about the results of his CT scan.  I will continue to try to reach him.  I personally reviewed the CT scan.  He has an unusually shaped lesion in the posterior subsegment of the RUL that distally has some spiculation.  It is not superficial and would require passage of a needle through normal lung should a transthoracic needle biopsy be attempted.  It looks like the RUL posterior subsegmental bronchus leads right to the lesion and this may be an option for biopsy of the lesion.  I will therefore likely refer Brett [name] to Pulmonary for consideration of a fiberoptic bronchoscopy with TBBx and brushing of the RUL posterior subsegmental lesion after I discuss this with him, assuming he agrees with the plan.

## 2015-09-05 NOTE — Addendum Note (Signed)
Addended by: [name] D on: 09/05/2015 02:41 PM   Modules accepted: Orders

## 2015-09-12 ENCOUNTER — Telehealth: Payer: Self-pay | Admitting: Emergency Medicine

## 2015-09-12 NOTE — Telephone Encounter (Signed)
RB the hospital placed this order for this pt and wanted to see if he would be a candidate for bronchoscopy due to the results of ct scan.  You have no openings until Oct.  Please advise. thanks

## 2015-09-12 NOTE — Telephone Encounter (Signed)
I can see on 8/24

## 2015-09-12 NOTE — Telephone Encounter (Signed)
Dr. Lake Bells, you have the soonest opening (15 min slot) on 8/24 can we work patient in on your schedule for this?

## 2015-09-12 NOTE — Telephone Encounter (Signed)
54 y.o. Smoker with incidentally found RUL lesion on CXR for another indication.  CT scan showed an elongated lesion in the RUL posterior subsegment with what appears to be a RUL posterior subsegmental branch leading to the lesion.  There is a 0.9 cm right hilar node.  At the distal end of the lesion are spiculations.  No other lesions/adenopathy are seen.  Is this patient a candidate for a bronchoscopy for TBBx and brushing to assess for possible malignancy?  Thank You.

## 2015-09-12 NOTE — Telephone Encounter (Signed)
Please set the patient up with any of the providers who does bronchoscopy (not CY or SN) to see the patient sooner if possible.

## 2015-09-13 NOTE — Telephone Encounter (Signed)
lmtcb x1 for pt. I have held this appointment date on BQ's schedule for this pt.

## 2015-09-13 NOTE — Telephone Encounter (Signed)
Pt called back and Chantel made appt for patient. Nothing more needed at this time.

## 2015-09-14 ENCOUNTER — Encounter: Payer: Self-pay | Admitting: Pulmonary Disease

## 2015-09-14 ENCOUNTER — Ambulatory Visit (INDEPENDENT_AMBULATORY_CARE_PROVIDER_SITE_OTHER): Payer: Medicare Other | Admitting: Pulmonary Disease

## 2015-09-14 VITALS — BP 112/60 | HR 76 | Ht 67.0 in | Wt 136.0 lb

## 2015-09-14 DIAGNOSIS — Z720 Tobacco use: Secondary | ICD-10-CM | POA: Diagnosis not present

## 2015-09-14 DIAGNOSIS — R911 Solitary pulmonary nodule: Secondary | ICD-10-CM

## 2015-09-14 NOTE — Patient Instructions (Signed)
We will arrange for a bronchoscopy on Thursday, 09/21/2015 at [facility]. Do not take aspirin or Plavix until the procedure Do not eat after midnight on 09/20/2015 We will see you back in 3-4 weeks

## 2015-09-14 NOTE — Progress Notes (Signed)
Subjective:    Patient ID: Brett [name], male    DOB: 1960-02-25, 55 y.o.   MRN: [id number]    HPI Chief Complaint  Patient presents with  . Advice Only    pt has RUL lesion, possible bronch per RB.     Brett [name] was refferred by Dr. Eppie Gibson in the settting of an abnormal CXR>  He said that he had been having some arm pain (left arm) for a few months so he had a CXR which showed an abnormal pulmonary nodule.  He has been having soreness in his shoulder, armpit on the left side after moving a heavy object in May, this has improved.  He notes mild dyspnea lately, but not much. He denies cough.  He notes that he has been a "little lazier" in the last few months, which he describes as malaise. He denies trouble sleeping, muscle weakness, or excessive sleepness.  His wife tells me that at baseline he had a significant head injury in 2012 and since then he has had a lot of neck, arm and back pain and some cognitive changes as well.  He has a history of cerebral vascular disease and diabetes.  He is on disability for the cognitive changes.  He has daily headaches, anxiety and depression.   He is a regular cigarette smoker.  He smokes less than 1 pack per day and has smoked since age 66.  He has quit off an on for a total of 2 years he thinks.  He is currently smoking.  No personal history of cancer.  He has a history of barrett's esophagus.   He had pneumonia in 2006 or 2007.   Past Medical History:  Diagnosis Date  . Barrett's esophagus 07/01/2013   Without dysplasia on biopsy 09/03/2012. Repeat EGD recommended 08/2015  . Carotid artery stenosis 07/01/2013   Requiring right sided stent   . Chronic pain syndrome 07/01/2013  . Closed head injury with brief loss of consciousness ([place]) 07/22/2010   Head trapped in a hydraulic device at work.  Fracture of orbital bones on right and brief loss of consciousness per report.  . Cognitive disorder 04/15/2011   Neuropsychological evaluation (03/2010):   Identified a number of problem areas including cognitive and psychiatric symptoms following a TBI in July 2012. There was likely a strong psycho-social overlay in regard to the cognitive deficits in the form of mood disorder with psychotic features and mixed anxiety symptomatology. His primary tested cognitive deficits are in the areas of attention, executi  . Degenerative joint disease of cervical spine 07/01/2013  . Dupuytren's contracture of both hands 04/08/2014  . Erectile dysfunction associated with type 2 diabetes mellitus ([place]) 07/01/2013  . Fibromyalgia 07/01/2013  . Hyperlipidemia LDL goal < 100 07/01/2013  . Osteoarthritis of right thumb 10/21/2014  . Peripheral vascular occlusive disease ([place]) 07/01/2013   Requiring 2 arterial stents above the left knee per report  . Post traumatic stress disorder 07/01/2013  . Severe major depression with psychotic features ([place]) 04/15/2011  . Tobacco abuse 07/01/2013  . Type 2 diabetes mellitus with vascular disease ([place]) 07/01/2013   Left lower extremity and right carotid stenting     Family History  Problem Relation Age of Onset  . Heart disease Mother   . Diabetes Mother   . Diabetes Father   . Heart attack Father   . Diabetes Sister   . Coronary artery disease Sister     s/p CABG  . Diabetes Brother   .  Healthy Daughter   . Healthy Son   . Diabetes Sister   . Healthy Sister   . Healthy Sister   . Diabetes Brother   . Coronary artery disease Brother     s/p CABG  . Diabetes Brother   . Healthy Brother   . Healthy Brother   . Healthy Daughter      Social History   Social History  . Marital status: Married    Spouse name: N/A  . Number of children: N/A  . Years of education: N/A   Occupational History  . Not on file.   Social History Main Topics  . Smoking status: Current Every Day Smoker    Packs/day: 1.00    Years: 35.00    Types: Cigarettes  . Smokeless tobacco: Never Used  . Alcohol use No  . Drug use: No  . Sexual  activity: Yes    Birth control/ protection: None     Comment: Wife had tubes tied   Other Topics Concern  . 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  Currently unemployed.  Finished 9th grade.  Lives with wife, 2 daughters, and mother-in-law in [place].     Allergies  Allergen Reactions  . Celebrex [Celecoxib]     Nightmares  . Gabapentin Other (See Comments)    suicidal thoughts     Outpatient Medications Prior to Visit  Medication Sig Dispense Refill  . aspirin EC 81 MG tablet Take 81 mg by mouth daily.    Marland Kitchen atorvastatin (LIPITOR) 40 MG tablet Take 1 tablet (40 mg total) by mouth daily. 90 tablet 3  . clopidogrel (PLAVIX) 75 MG tablet Take 75 mg by mouth daily with breakfast.    . glipiZIDE (GLUCOTROL) 10 MG tablet Take 2 tablets (20 mg total) by mouth 2 (two) times daily before a meal. 360 tablet 3  . glucose blood (ACCU-CHEK AVIVA PLUS) test strip 1 each by Other route as needed for other (twice daily). 100 each 5  . HYDROcodone-acetaminophen (NORCO) 10-325 MG tablet Take 1 tablet by mouth every 6 (six) hours as needed for severe pain. 90 tablet 0  . lisinopril (PRINIVIL,ZESTRIL) 5 MG tablet Take 1 tablet (5 mg total) by mouth daily. 90 tablet 3  . metFORMIN (GLUCOPHAGE) 500 MG tablet Take 1 tablet (500 mg total) by mouth 2 (two) times daily with a meal. 180 tablet 3  . pantoprazole (PROTONIX) 40 MG tablet Take 1 tablet (40 mg total) by mouth daily. 90 tablet 3  . pregabalin (LYRICA) 50 MG capsule Take 1 capsule (50 mg total) by mouth 3 (three) times daily. 90 capsule 11  . sitaGLIPtin (JANUVIA) 100 MG tablet Take 1 tablet (100 mg total) by mouth daily. 90 tablet 3   No facility-administered medications prior to visit.       Review of Systems  Constitutional: Negative for fever and unexpected weight change.  HENT: Negative for  congestion, dental problem, ear pain, nosebleeds, postnasal drip, rhinorrhea, sinus pressure, sneezing, sore throat and trouble swallowing.   Eyes: Negative for redness and itching.  Respiratory: Positive for shortness of breath. Negative for cough, chest tightness and wheezing.   Cardiovascular: Negative for palpitations and leg swelling.  Gastrointestinal: Negative for nausea and vomiting.  Genitourinary: Negative for dysuria.  Musculoskeletal: Negative for joint swelling.  Skin: Negative for rash.  Neurological:  Negative for headaches.  Hematological: Does not bruise/bleed easily.  Psychiatric/Behavioral: Negative for dysphoric mood. The patient is not nervous/anxious.        Objective:   Physical Exam Vitals:   09/14/15 1606  BP: 112/60  Pulse: 76  SpO2: 98%  Weight: 136 lb (61.7 kg)  Height: '5\' 7"'$  (1.702 m)   RA  Gen: well appearing, no acute distress HENT: NCAT, OP clear, neck supple without masses Eyes: PERRL, EOMi Lymph: no cervical lymphadenopathy PULM: CTA B CV: RRR, no mgr, no JVD GI: BS+, soft, nontender, no hsm Derm: no rash or skin breakdown MSK: normal bulk and tone Neuro: A&Ox4, CN II-XII intact, strength 5/5 in all 4 extremities Psyche: normal mood and affect   CT chest images personally reviewed showing nodular opacities in the posterior segment of the right upper lobe and a small hilar lymph node on that side  Records from primary care physician reviewed where he was evaluated for some left-sided shoulder pain and then had a chest x-ray done.     Assessment & Plan:  Mass of lung parenchyma I have personally reviewed the images from his CT chest from a week ago which shows multiple nodules in the right upper lobe posterior segment. Considering his smoking history this is very worrisome for malignancy. He also has a small hilar lymph node on that side but no mediastinal or pretracheal lymphadenopathy can appreciate.  Overall, very worried that this  represents primary bronchogenic carcinoma. It appears to be accessible from standard bronchoscopy based on the imaging findings.  Because he is on aspirin and Plavix will need to hold this for a few days prior to performing a bronchoscopy.  Plan: CBC Hold aspirin and Plavix Plan bronchoscopy 09/21/2015 at [facility] at 7:30 in the morning Follow-up after bronchoscopy  Tobacco abuse Counseled at length to quit smoking today.    Current Outpatient Prescriptions:  .  aspirin EC 81 MG tablet, Take 81 mg by mouth daily., Disp: , Rfl:  .  atorvastatin (LIPITOR) 40 MG tablet, Take 1 tablet (40 mg total) by mouth daily., Disp: 90 tablet, Rfl: 3 .  clopidogrel (PLAVIX) 75 MG tablet, Take 75 mg by mouth daily with breakfast., Disp: , Rfl:  .  glipiZIDE (GLUCOTROL) 10 MG tablet, Take 2 tablets (20 mg total) by mouth 2 (two) times daily before a meal., Disp: 360 tablet, Rfl: 3 .  glucose blood (ACCU-CHEK AVIVA PLUS) test strip, 1 each by Other route as needed for other (twice daily)., Disp: 100 each, Rfl: 5 .  HYDROcodone-acetaminophen (NORCO) 10-325 MG tablet, Take 1 tablet by mouth every 6 (six) hours as needed for severe pain., Disp: 90 tablet, Rfl: 0 .  lisinopril (PRINIVIL,ZESTRIL) 5 MG tablet, Take 1 tablet (5 mg total) by mouth daily., Disp: 90 tablet, Rfl: 3 .  metFORMIN (GLUCOPHAGE) 500 MG tablet, Take 1 tablet (500 mg total) by mouth 2 (two) times daily with a meal., Disp: 180 tablet, Rfl: 3 .  pantoprazole (PROTONIX) 40 MG tablet, Take 1 tablet (40 mg total) by mouth daily., Disp: 90 tablet, Rfl: 3 .  pregabalin (LYRICA) 50 MG capsule, Take 1 capsule (50 mg total) by mouth 3 (three) times daily., Disp: 90 capsule, Rfl: 11 .  sitaGLIPtin (JANUVIA) 100 MG tablet, Take 1 tablet (100 mg total) by mouth daily., Disp: 90 tablet, Rfl: 3

## 2015-09-14 NOTE — Assessment & Plan Note (Signed)
Counseled at length to quit smoking today. 

## 2015-09-14 NOTE — Assessment & Plan Note (Signed)
I have personally reviewed the images from his CT chest from a week ago which shows multiple nodules in the right upper lobe posterior segment. Considering his smoking history this is very worrisome for malignancy. He also has a small hilar lymph node on that side but no mediastinal or pretracheal lymphadenopathy can appreciate.  Overall, very worried that this represents primary bronchogenic carcinoma. It appears to be accessible from standard bronchoscopy based on the imaging findings.  Because he is on aspirin and Plavix will need to hold this for a few days prior to performing a bronchoscopy.  Plan: CBC Hold aspirin and Plavix Plan bronchoscopy 09/21/2015 at [facility] hospital at 7:30 in the morning Follow-up after bronchoscopy

## 2015-09-15 ENCOUNTER — Telehealth: Payer: Self-pay

## 2015-09-15 NOTE — Telephone Encounter (Signed)
[phone] Lora-Wife calling Caryl Pina Back

## 2015-09-15 NOTE — Telephone Encounter (Signed)
Bronch moved to 7:30.   Forwarding to BQ as FYI.

## 2015-09-15 NOTE — Telephone Encounter (Signed)
Pt and wife aware of appt time change.

## 2015-09-15 NOTE — Telephone Encounter (Signed)
Can we do it at 7:30 on the same day?

## 2015-09-15 NOTE — Telephone Encounter (Signed)
Called and spoke with Baxter Flattery at respiratory to schedule bronch for pt as discussed in office visit yesterday.  This patient was seen after bronch suite had closed and could not be scheduled at office visit yesterday.   Pt is scheduled for bronch at [facility] on 09/21/2015 at 8:00 a.m.  Pt was made aware of date yesterday at [place] but not time.  lmtcb X1 for pt to make aware of bronch time.  Pt only has 1 number on file.  Wcb.    Also forwarding to BQ as FYI.

## 2015-09-15 NOTE — Telephone Encounter (Signed)
Called spoke with pt's wife. Informed her of bronch date and time. She voiced understanding and had no further questions.

## 2015-09-21 ENCOUNTER — Ambulatory Visit (HOSPITAL_COMMUNITY)
Admission: RE | Admit: 2015-09-21 | Discharge: 2015-09-21 | Disposition: A | Discharge status: Home / Self Care | Payer: Medicare Other | Source: Ambulatory Visit | Attending: Pulmonary Disease | Admitting: Pulmonary Disease

## 2015-09-21 ENCOUNTER — Encounter (HOSPITAL_COMMUNITY): Payer: Self-pay | Admitting: Respiratory Therapy

## 2015-09-21 ENCOUNTER — Ambulatory Visit (HOSPITAL_COMMUNITY): Payer: Medicare Other

## 2015-09-21 ENCOUNTER — Encounter (HOSPITAL_COMMUNITY)
Admission: RE | Disposition: A | Discharge status: Home / Self Care | Payer: Self-pay | Source: Ambulatory Visit | Attending: Pulmonary Disease

## 2015-09-21 DIAGNOSIS — Z7984 Long term (current) use of oral hypoglycemic drugs: Secondary | ICD-10-CM | POA: Diagnosis not present

## 2015-09-21 DIAGNOSIS — Z79899 Other long term (current) drug therapy: Secondary | ICD-10-CM | POA: Insufficient documentation

## 2015-09-21 DIAGNOSIS — Z9889 Other specified postprocedural states: Secondary | ICD-10-CM

## 2015-09-21 DIAGNOSIS — C3411 Malignant neoplasm of upper lobe, right bronchus or lung: Secondary | ICD-10-CM | POA: Diagnosis not present

## 2015-09-21 DIAGNOSIS — G894 Chronic pain syndrome: Secondary | ICD-10-CM | POA: Insufficient documentation

## 2015-09-21 DIAGNOSIS — R918 Other nonspecific abnormal finding of lung field: Secondary | ICD-10-CM

## 2015-09-21 DIAGNOSIS — E1151 Type 2 diabetes mellitus with diabetic peripheral angiopathy without gangrene: Secondary | ICD-10-CM | POA: Insufficient documentation

## 2015-09-21 DIAGNOSIS — Z7982 Long term (current) use of aspirin: Secondary | ICD-10-CM | POA: Diagnosis not present

## 2015-09-21 DIAGNOSIS — E785 Hyperlipidemia, unspecified: Secondary | ICD-10-CM | POA: Insufficient documentation

## 2015-09-21 DIAGNOSIS — F17210 Nicotine dependence, cigarettes, uncomplicated: Secondary | ICD-10-CM | POA: Insufficient documentation

## 2015-09-21 DIAGNOSIS — M797 Fibromyalgia: Secondary | ICD-10-CM | POA: Insufficient documentation

## 2015-09-21 DIAGNOSIS — R846 Abnormal cytological findings in specimens from respiratory organs and thorax: Secondary | ICD-10-CM | POA: Diagnosis not present

## 2015-09-21 DIAGNOSIS — Z7902 Long term (current) use of antithrombotics/antiplatelets: Secondary | ICD-10-CM | POA: Insufficient documentation

## 2015-09-21 HISTORY — PX: VIDEO BRONCHOSCOPY: SHX5072

## 2015-09-21 LAB — GLUCOSE, CAPILLARY: Glucose-Capillary: 172 mg/dL — ABNORMAL HIGH (ref 65–99)

## 2015-09-21 SURGERY — BRONCHOSCOPY, WITH FLUOROSCOPY
Anesthesia: Moderate Sedation | Laterality: Bilateral

## 2015-09-21 MED ORDER — LIDOCAINE HCL 1 % IJ SOLN
INTRAMUSCULAR | Status: DC | PRN
  Administered 2015-09-21: 6 mL via RESPIRATORY_TRACT

## 2015-09-21 MED ORDER — FENTANYL CITRATE (PF) 100 MCG/2ML IJ SOLN
INTRAMUSCULAR | Status: AC
  Filled 2015-09-21: qty 4

## 2015-09-21 MED ORDER — PHENYLEPHRINE HCL 0.25 % NA SOLN
NASAL | Status: DC | PRN
  Administered 2015-09-21: 2 via NASAL

## 2015-09-21 MED ORDER — FENTANYL CITRATE (PF) 100 MCG/2ML IJ SOLN
INTRAMUSCULAR | Status: DC | PRN
Start: 2015-09-21 — End: 2015-09-21
  Administered 2015-09-21: 25 ug via INTRAVENOUS
  Administered 2015-09-21: 50 ug via INTRAVENOUS

## 2015-09-21 MED ORDER — SODIUM CHLORIDE 0.9 % IV SOLN
INTRAVENOUS | Status: DC
  Administered 2015-09-21: 07:00:00 via INTRAVENOUS

## 2015-09-21 MED ORDER — MIDAZOLAM HCL 10 MG/2ML IJ SOLN
INTRAMUSCULAR | Status: DC | PRN
  Administered 2015-09-21: 2 mg via INTRAVENOUS
  Administered 2015-09-21: 1 mg via INTRAVENOUS

## 2015-09-21 MED ORDER — PHENYLEPHRINE HCL 0.25 % NA SOLN: 1.0000 | Freq: Four times a day (QID) | NASAL | Status: DC | PRN

## 2015-09-21 MED ORDER — LIDOCAINE HCL 2 % EX GEL
CUTANEOUS | Status: DC | PRN
  Administered 2015-09-21: 1

## 2015-09-21 MED ORDER — LIDOCAINE HCL 2 % EX GEL: 1.0000 "application " | Freq: Once | CUTANEOUS | Status: DC

## 2015-09-21 MED ORDER — MIDAZOLAM HCL 5 MG/ML IJ SOLN
INTRAMUSCULAR | Status: AC
  Filled 2015-09-21: qty 2

## 2015-09-21 MED ORDER — BUTAMBEN-TETRACAINE-BENZOCAINE 2-2-14 % EX AERO: 1.0000 | INHALATION_SPRAY | Freq: Once | CUTANEOUS | Status: DC

## 2015-09-21 NOTE — Op Note (Signed)
[facility]iop[facility] Patient Name: Brett [name] Procedure Date: 09/21/2015 MRN: [id number] Attending MD: Juanito Doom , MD Date of Birth: 06-16-1960 CSN: [id number] Age: 55 Admit Type: Outpatient Ethnicity: Not Hispanic or Latino Procedure:            Bronchoscopy Indications:          Right upper lobe mass Providers:            Nathaneil Canary B. Lake Bells, MD, Andre Lefort RRT,RCP,                        Ashley Mariner RRT,RCP Referring MD:          Medicines:            Midazolam 3 mg mg IV, Fentanyl 75 mcg IV, Lidocaine                        applied to nares and subglottic space, Lidocaine 1%                        applied to cords 3 mL, Lidocaine 1% applied to the                        tracheobronchial tree 6 mL Complications:        No immediate complications Estimated Blood Loss: Estimated blood loss was minimal. Procedure:      Pre-Anesthesia Assessment:      - A History and Physical has been performed. Patient meds and allergies       have been reviewed. The risks and benefits of the procedure and the       sedation options and risks were discussed with the patient. All       questions were answered and informed consent was obtained. Patient       identification and proposed procedure were verified prior to the       procedure by the physician and the technician in the procedure room.       Mental Status Examination: alert and oriented. Airway Examination:       normal oropharyngeal airway. Respiratory Examination: clear to       auscultation. CV Examination: RRR, no murmurs, no S3 or S4. ASA Grade       Assessment: II - A patient with mild systemic disease. After reviewing       the risks and benefits, the patient was deemed in satisfactory condition       to undergo the procedure. The anesthesia plan was to use moderate       sedation / analgesia (conscious sedation). Immediately prior to       administration of medications, the patient was  re-assessed for adequacy       to receive sedatives. The heart rate, respiratory rate, oxygen       saturations, blood pressure, adequacy of pulmonary ventilation, and       response to care were monitored throughout the procedure. The physical       status of the patient was re-assessed after the procedure.      After obtaining informed consent, the bronchoscope was passed under       direct vision. Throughout the procedure, the patient's blood pressure,       pulse, and oxygen saturations were monitored continuously. the [id number]       [id number] scope was introduced through the  right nostril and advanced to       the tracheobronchial tree. The procedure was accomplished without       difficulty. The patient tolerated the procedure well. The total duration       of the procedure was 16 minutes. Findings:      The nasopharynx/oropharynx appears normal. The larynx appears normal.       The vocal cords appear normal. The subglottic space is normal. The       trachea is of normal caliber. The carina is sharp. The tracheobronchial       tree was examined to at least the first subsegmental level. Bronchial       mucosa and anatomy are normal; there are no endobronchial lesions, and       no secretions.      Transbronchial biopsies of a mass were performed in the posterior       segment of the right upper lobe using forceps and sent for       histopathology examination. The procedure was guided by fluoroscopy.       Transbronchial biopsy technique was selected because the sampling site       was not visible endoscopically. Ten biopsy passes were performed. Nine       biopsy samples were obtained.      Transbronchial brushings of a mass were obtained in the posterior       segment of the right upper lobe with a cytology brush and sent for       routine cytology. Four samples were obtained.      Bronchoalveolar lavage was performed in the RUL posterior segment (B2)       of the lung and sent for  routine cytology. 90 mL of fluid were       instilled. 10 mL were returned. The return was blood-tinged. There were       no mucoid plugs in the return fluid. Impression:      - Right upper lobe mass      - The examination was normal.      - Transbronchial lung biopsies were performed.      - Transbronchial brushings were obtained.      - Bronchoalveolar lavage was performed. Moderate Sedation:      Moderate (conscious) sedation was personally administered by the       endoscopist. The following parameters were monitored: oxygen saturation,       heart rate, blood pressure, and response to care. Total physician       intraservice time was 22 minutes. Recommendation:      - Await BAL, biopsy, brushing and cytology results. Procedure Code(s):      --- Professional ---      [phone], Bronchoscopy, rigid or flexible, including fluoroscopic guidance,       when performed; with transbronchial lung biopsy(s), single lobe      31497, Bronchoscopy, rigid or flexible, including fluoroscopic guidance,       when performed; with bronchial alveolar lavage      [phone], Bronchoscopy, rigid or flexible, including fluoroscopic guidance,       when performed; with brushing or protected brushings      99152, Moderate sedation services provided by the same physician or       other qualified health care professional performing the diagnostic or       therapeutic service that the sedation supports, requiring the presence       of an independent trained  observer to assist in the monitoring of the       patient's level of consciousness and physiological status; initial 15       minutes of intraservice time, patient age 36 years or older Diagnosis Code(s):      --- Professional ---      R91.8, Other nonspecific abnormal finding of lung field CPT copyright 2016 American Medical Association. All rights reserved. The codes documented in this report are preliminary and upon coder review may  be revised to meet  current compliance requirements. Norlene Campbell, MD Juanito Doom, MD 09/21/2015 8:27:25 AM This report has been signed electronically. Number of Addenda: 0 Scope In: 7:56:40 AM Scope Out: 8:12:52 AM

## 2015-09-21 NOTE — Progress Notes (Signed)
Video Bronchoscopy done  Intervention Bronchial washing done Intervention Bronchial biopsy done Intervention Bronchial brushing done   

## 2015-09-21 NOTE — Discharge Instructions (Signed)
Flexible Bronchoscopy, Care After Refer to this sheet in the next few weeks. These instructions provide you with information on caring for yourself after your procedure. Your health care provider may also give you more specific instructions. Your treatment has been planned according to current medical practices, but problems sometimes occur. Call your health care provider if you have any problems or questions after your procedure.  WHAT TO EXPECT AFTER THE PROCEDURE It is normal to have the following symptoms for 24-48 hours after the procedure:   Increased cough.  Low-grade fever.  Sore throat or hoarse voice.  Small streaks of blood in your thick spit (sputum) if tissue samples were taken (biopsy). HOME CARE INSTRUCTIONS   Do not eat or drink anything for 2 hours after your procedure. Your nose and throat were numbed by medicine. If you try to eat or drink before the medicine wears off, food or drink could go into your lungs or you could burn yourself. After the numbness is gone and your cough and gag reflexes have returned, you may eat soft food and drink liquids slowly.   The day after the procedure, you can go back to your normal diet.   You may resume normal activities.   Keep all follow-up visits as directed by your health care provider. It is important to keep all your appointments, especially if tissue samples were taken for testing (biopsy). SEEK IMMEDIATE MEDICAL CARE IF:   You have increasing shortness of breath.   You become light-headed or faint.   You have chest pain.   You have any new concerning symptoms.  You cough up more than a small amount of blood.  The amount of blood you cough up increases. MAKE SURE YOU:  Understand these instructions.  Will watch your condition.  Will get help right away if you are not doing well or get worse.   This information is not intended to replace advice given to you by your health care provider. Make sure you discuss  any questions you have with your health care provider.  Nothing to eat or drink until   10:15 am today  09/21/2015   Document Released: 07/27/2004 Document Revised: 01/28/2014 Document Reviewed: 09/11/2012 Elsevier Interactive Patient Education 2016 [employer].

## 2015-09-21 NOTE — H&P (Signed)
Brett [name] is an 55 y.o. male.   Chief Complaint: Lung nodules HPI: Brett [name] presented to the [facility] after a CXR and CT scan showed pulmonary nodules.  He is a smoker, asymptomatic in terms of respiratory complaints.  Past Medical History:  Diagnosis Date  . Barrett's esophagus 07/01/2013   Without dysplasia on biopsy 09/03/2012. Repeat EGD recommended 08/2015  . Carotid artery stenosis 07/01/2013   Requiring right sided stent   . Chronic pain syndrome 07/01/2013  . Closed head injury with brief loss of consciousness ([place]) 07/22/2010   Head trapped in a hydraulic device at work.  Fracture of orbital bones on right and brief loss of consciousness per report.  . 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 There was likely a strong psycho-social overlay in regard to the cognitive deficits in the form of mood disorder with psychotic features and mixed anxiety symptomatology. His primary tested cognitive deficits are in the areas of attention, executi  . Degenerative joint disease of cervical spine 07/01/2013  . Dupuytren's contracture of both hands 04/08/2014  . Erectile dysfunction associated with type 2 diabetes mellitus ([place]) 07/01/2013  . Fibromyalgia 07/01/2013  . Hyperlipidemia LDL goal < 100 07/01/2013  . Osteoarthritis of right thumb 10/21/2014  . Peripheral vascular occlusive disease ([place]) 07/01/2013   Requiring 2 arterial stents above the left knee per report  . Post traumatic stress disorder 07/01/2013  . Severe major depression with psychotic features ([place]) 04/15/2011  . Tobacco abuse 07/01/2013  . Type 2 diabetes mellitus with vascular disease ([place]) 07/01/2013   Left lower extremity and right carotid stenting    Past Surgical History:  Procedure Laterality Date  . FRACTURE SURGERY Right July 2012   Right orbital bones  . HERNIA  REPAIR  [id number]   Umbilical    Family History  Problem Relation Age of Onset  . Heart disease Mother   . Diabetes Mother   . Diabetes Father   . Heart attack Father   . Diabetes Sister   . Coronary artery disease Sister     s/p CABG  . Diabetes Brother   . Healthy Daughter   . Healthy Son   . Diabetes Sister   . Healthy Sister   . Healthy Sister   . Diabetes Brother   . Coronary artery disease Brother     s/p CABG  . Diabetes Brother   . Healthy Brother   . Healthy Brother   . Healthy Daughter    Social History:  reports that he has been smoking Cigarettes.  He has a 35.00 pack-year smoking history. He has never used smokeless tobacco. He reports that he does not drink alcohol or use drugs.  Allergies:  Allergies  Allergen Reactions  . Celebrex [Celecoxib]     Nightmares  . Gabapentin Other (See Comments)    suicidal thoughts    Medications Prior to Admission  Medication Sig Dispense Refill  . atorvastatin (LIPITOR) 40 MG tablet Take 1 tablet (40 mg total) by mouth daily. 90 tablet 3  . clopidogrel (PLAVIX) 75 MG tablet Take 75 mg by mouth daily with breakfast.    . glipiZIDE (GLUCOTROL) 10 MG tablet Take 2 tablets (20 mg total) by mouth 2 (two) times daily before a meal. 360 tablet 3  . glucose blood (ACCU-CHEK AVIVA PLUS) test strip 1 each by Other route as needed for  other (twice daily). 100 each 5  . HYDROcodone-acetaminophen (NORCO) 10-325 MG tablet Take 1 tablet by mouth every 6 (six) hours as needed for severe pain. 90 tablet 0  . lisinopril (PRINIVIL,ZESTRIL) 5 MG tablet Take 1 tablet (5 mg total) by mouth daily. 90 tablet 3  . metFORMIN (GLUCOPHAGE) 500 MG tablet Take 1 tablet (500 mg total) by mouth 2 (two) times daily with a meal. 180 tablet 3  . pantoprazole (PROTONIX) 40 MG tablet Take 1 tablet (40 mg total) by mouth daily. 90 tablet 3  . pregabalin (LYRICA) 50 MG capsule Take 1 capsule (50 mg total) by mouth 3 (three) times daily. 90 capsule 11  .  sitaGLIPtin (JANUVIA) 100 MG tablet Take 1 tablet (100 mg total) by mouth daily. 90 tablet 3  . aspirin EC 81 MG tablet Take 81 mg by mouth daily.      Results for orders placed or performed during the hospital encounter of 09/21/15 (from the past 48 hour(s))  Glucose, capillary     Status: Abnormal   Collection Time: 09/21/15  7:10 AM  Result Value Ref Range   Glucose-Capillary 172 (H) 65 - 99 mg/dL   No results found.  ROS  Blood pressure (!) 149/72, temperature 97.5 F (36.4 C), temperature source Oral, resp. rate (!) 23, SpO2 99 %. Physical Exam   Gen: well appearing HENT: OP clear, TM's clear, neck supple PULM: CTA B, normal percussion CV: RRR, no mgr, trace edema GI: BS+, soft, nontender Derm: no cyanosis or rash Psyche: normal mood and affect   CBC    Component Value Date/Time   WBC 6.7 05/26/2013 1216   RBC 4.71 05/26/2013 1216   HGB 15.0 05/26/2013 1216   HCT 41.8 05/26/2013 1216   PLT 256 05/26/2013 1216   MCV 88.7 05/26/2013 1216   MCH 31.8 05/26/2013 1216   MCHC 35.9 05/26/2013 1216   RDW 12.5 05/26/2013 1216   08/2015 CT chest > RUL posterior segment pulmonary nodules, multiple, one hilar lymph node  Assessment/Plan 55 y/o smoker with multiple pulmonary nodules, worrisome for lung cancer.  Plan: Bronchoscopy with biopsy Moderate sedation  Simonne Maffucci, MD 09/21/2015, 7:26 AM

## 2015-09-22 ENCOUNTER — Telehealth: Payer: Self-pay | Admitting: Pulmonary Disease

## 2015-09-22 ENCOUNTER — Encounter (HOSPITAL_COMMUNITY): Payer: Self-pay | Admitting: Pulmonary Disease

## 2015-09-22 DIAGNOSIS — C3491 Malignant neoplasm of unspecified part of right bronchus or lung: Secondary | ICD-10-CM

## 2015-09-22 NOTE — Telephone Encounter (Signed)
I tried calling the Carsey family to go over the results of his biopsy but unfortunately I was unable to reach them.  I left a message stating that I would like to talk to them today.  Will cc triage so they can contact me if he calls back today.  As I am working until 9:00 tonight triage can text me when she calls back.

## 2015-09-22 NOTE — Telephone Encounter (Signed)
I was able to reach Brett [name]'s wife this evening to let them know that the biopsy showed Non-small-cell lung cancer, favor adenocarcinoma.    I explained that I will order a PET scan and a referral to medical oncology, orders placed.  His wife Brett [name] requests that she be called at: [phone]

## 2015-09-27 ENCOUNTER — Telehealth: Payer: Self-pay | Admitting: *Deleted

## 2015-09-27 DIAGNOSIS — R918 Other nonspecific abnormal finding of lung field: Secondary | ICD-10-CM

## 2015-09-27 NOTE — Telephone Encounter (Signed)
I called the wife per Dr. Anastasia Pall note.  She and patient answered the phone.  I updated them on appt for Rachel next week on 10/05/15 arrive at 2:30.

## 2015-09-27 NOTE — Telephone Encounter (Signed)
Oncology Nurse Navigator Documentation  Oncology Nurse Navigator Flowsheets 09/27/2015  Navigator Encounter Type Telephone/I received a referral on Mr. Cueto.  I called but was unable to reach.  I left my name and phone number to call.   Telephone Outgoing Call  Treatment Phase Pre-Tx/Tx Discussion  Barriers/Navigation Needs Coordination of Care  Interventions Coordination of Care  Coordination of Care Appts  Acuity Level 2  Acuity Level 2 Assistance expediting appointments  Time Spent with Patient 30

## 2015-09-29 ENCOUNTER — Encounter: Payer: Self-pay | Admitting: Pulmonary Disease

## 2015-09-29 ENCOUNTER — Ambulatory Visit (INDEPENDENT_AMBULATORY_CARE_PROVIDER_SITE_OTHER): Payer: Medicare Other | Admitting: Pulmonary Disease

## 2015-09-29 ENCOUNTER — Telehealth: Payer: Self-pay | Admitting: *Deleted

## 2015-09-29 VITALS — BP 108/60 | HR 83 | Ht 67.0 in | Wt 135.6 lb

## 2015-09-29 DIAGNOSIS — R0600 Dyspnea, unspecified: Secondary | ICD-10-CM | POA: Diagnosis not present

## 2015-09-29 DIAGNOSIS — Z23 Encounter for immunization: Secondary | ICD-10-CM | POA: Diagnosis not present

## 2015-09-29 DIAGNOSIS — C3491 Malignant neoplasm of unspecified part of right bronchus or lung: Secondary | ICD-10-CM

## 2015-09-29 NOTE — Assessment & Plan Note (Addendum)
  Non-Small Cell carcinoma Plan: We will do spirometry today to evaluate your lung function. Lung function did not indicate COPD. Flu Shot today  PET Scan as scheduled 10/03/2015 [facility] as scheduled 10/05/2015 Try to stop smoking , it is the single most powerful action you can take to decrease present and future health problems. Follow Up as needed with Dr. Lake Bells. Please contact office for sooner follow up if symptoms do not improve or worsen or seek emergency care

## 2015-09-29 NOTE — Telephone Encounter (Signed)
Mailed clinic letter to pt.  

## 2015-09-29 NOTE — Patient Instructions (Addendum)
It is nice to meet you today. We will do spirometry today to evaluate your lung function. Flu Shot today  PET Scan as scheduled 10/03/2015 [facility] as scheduled 10/05/2015 Try to stop smoking , it is the single most powerful action you can take to decrease present and future health problems. Follow Up as needed with Dr. Lake Bells. Please contact office for sooner follow up if symptoms do not improve or worsen or seek emergency care

## 2015-09-29 NOTE — Progress Notes (Signed)
History of Present Illness Brett [name] is a 55 y.o. male current smoker with new diagnosis of lung cancer followed by Dr. Lake Bells.Additional history includes head injury with chronic headaches, and DM.   09/29/2015 Follow Up Office Visit for Bronchoscopy on 09/21/2015 Pt. States he has been doing well since the bronchoscopy. He denies and bloody secretions. He states he has some dyspnea on exertion, but states it is only a little.He has new swelling to his groin area.He is scheduled for PET scan on 10/03/2015. He is scheduled for the [facility] 10/05/2015. He states his cough is more frequent since bronchoscopy/ biopsy. He states he has clear secretions. He denies chest pain, fever, orthopnea or hemoptysis.He gives history of weight loss of about  7- 10  pounds over the last 2 years.    Tests  CT Chest with contrast 09/01/2015  1. Spiculated and somewhat elongated right upper lobe lesion (2-4 cm) corresponding to the recent radiographic finding should be considered Bronchogenic Carcinoma unless proven otherwise. 2. Asymmetric right hilar lymph node is suspicious for metastatic nodal disease.  Bronch and Lung Biopsy 09/21/2015 Non-Small Cell Carcinona  Spirometry>> 09/29/2015 Mild Restriction FVC: 3.21 ( 73%) FEV1: 2.53 ( 76%) F/F/ Ratio: 79% VC: 4.37  Past medical hx Past Medical History:  Diagnosis Date  . Barrett's esophagus 07/01/2013   Without dysplasia on biopsy 09/03/2012. Repeat EGD recommended 08/2015  . Carotid artery stenosis 07/01/2013   Requiring right sided stent   . Chronic pain syndrome 07/01/2013  . Closed head injury with brief loss of consciousness ([place]) 07/22/2010   Head trapped in a hydraulic device at work.  Fracture of orbital bones on right and brief loss of consciousness per report.  . Cognitive disorder 04/15/2011   Neuropsychological evaluation (03/2010):  Identified a number of problem areas including cognitive and psychiatric symptoms following a TBI in July  2012. There was likely a strong psycho-social overlay in regard to the cognitive deficits in the form of mood disorder with psychotic features and mixed anxiety symptomatology. His primary tested cognitive deficits are in the areas of attention, executi  . Degenerative joint disease of cervical spine 07/01/2013  . Dupuytren's contracture of both hands 04/08/2014  . Erectile dysfunction associated with type 2 diabetes mellitus ([place]) 07/01/2013  . Fibromyalgia 07/01/2013  . Hyperlipidemia LDL goal < 100 07/01/2013  . Osteoarthritis of right thumb 10/21/2014  . Peripheral vascular occlusive disease ([place]) 07/01/2013   Requiring 2 arterial stents above the left knee per report  . Post traumatic stress disorder 07/01/2013  . Severe major depression with psychotic features ([place]) 04/15/2011  . Tobacco abuse 07/01/2013  . Type 2 diabetes mellitus with vascular disease ([place]) 07/01/2013   Left lower extremity and right carotid stenting     Past surgical hx, Family hx, Social hx all reviewed.  Current Outpatient Prescriptions on File Prior to Visit  Medication Sig  . aspirin EC 81 MG tablet Take 81 mg by mouth daily.  Marland Kitchen atorvastatin (LIPITOR) 40 MG tablet Take 1 tablet (40 mg total) by mouth daily.  . clopidogrel (PLAVIX) 75 MG tablet Take 75 mg by mouth daily with breakfast.  . glipiZIDE (GLUCOTROL) 10 MG tablet Take 2 tablets (20 mg total) by mouth 2 (two) times daily before a meal.  . glucose blood (ACCU-CHEK AVIVA PLUS) test strip 1 each by Other route as needed for other (twice daily).  Marland Kitchen HYDROcodone-acetaminophen (NORCO) 10-325 MG tablet Take 1 tablet by mouth every 6 (six) hours as needed  for severe pain.  Marland Kitchen lisinopril (PRINIVIL,ZESTRIL) 5 MG tablet Take 1 tablet (5 mg total) by mouth daily.  . metFORMIN (GLUCOPHAGE) 500 MG tablet Take 1 tablet (500 mg total) by mouth 2 (two) times daily with a meal.  . pantoprazole (PROTONIX) 40 MG tablet Take 1 tablet (40 mg total) by mouth daily.  . pregabalin  (LYRICA) 50 MG capsule Take 1 capsule (50 mg total) by mouth 3 (three) times daily.  . sitaGLIPtin (JANUVIA) 100 MG tablet Take 1 tablet (100 mg total) by mouth daily.   No current facility-administered medications on file prior to visit.      Allergies  Allergen Reactions  . Celebrex [Celecoxib]     Nightmares  . Gabapentin Other (See Comments)    suicidal thoughts    Review Of Systems:  Constitutional:   No  weight loss, night sweats,  Fevers, chills, fatigue, or  lassitude.  HEENT:   + headaches,  Difficulty swallowing,  Tooth/dental problems, or  Sore throat,                No sneezing, itching, ear ache, nasal congestion, post nasal drip,   CV:  No chest pain,  Orthopnea, PND, swelling in lower extremities, anasarca, dizziness, palpitations, syncope.   GI  No heartburn, indigestion, abdominal pain, nausea, vomiting, diarrhea, change in bowel habits, loss of appetite, bloody stools.   Resp: + shortness of breath with exertion not  at rest.  No excess mucus, no productive cough,  No non-productive cough,  No coughing up of blood.  No change in color of mucus.  No wheezing.  No chest wall deformity  Skin: no rash or lesions., some swelling noted in groin area.  GU: no dysuria, change in color of urine, no urgency or frequency.  No flank pain, no hematuria   MS:  No joint pain or swelling.  No decreased range of motion.  No back pain.  Psych:  No change in mood or affect. No depression or anxiety.  No memory loss.   Vital Signs BP 108/60 (BP Location: Right Arm, Cuff Size: Normal)   Pulse 83   Ht '5\' 7"'$  (1.702 m)   Wt 135 lb 9.6 oz (61.5 kg)   SpO2 97%   BMI 21.24 kg/m    Physical Exam:  General- No distress,  A&Ox3, anxious, thin male ENT: No sinus tenderness, TM clear, pale nasal mucosa, no oral exudate,no post nasal drip, no LAN Cardiac: S1, S2, regular rate and rhythm, no murmur Chest: No wheeze/ rales/ dullness; no accessory muscle use, no nasal flaring, no  sternal retractions Abd.: Soft Non-tender Ext: No clubbing cyanosis, edema Neuro:  normal strength Skin: No rashes, warm and dry Psych: anxious  mood and behavior   Assessment/Plan  Lung mass  Non-Small Cell carcinoma Plan: We will do spirometry today to evaluate your lung function. Lung function did not indicate COPD. Flu Shot today  PET Scan as scheduled 10/03/2015 [facility] as scheduled 10/05/2015 Try to stop smoking , it is the single most powerful action you can take to decrease present and future health problems. Follow Up as needed with Dr. Lake Bells. Please contact office for sooner follow up if symptoms do not improve or worsen or seek emergency care       Magdalen Spatz, NP 09/29/2015  5:00 PM

## 2015-09-29 NOTE — Progress Notes (Signed)
Attending:  I have seen and examined the patient with Eric Form and I agree with the findings from her note  Some cough after bronchoscopy, no pain, has noted some mild groin swelling  On exam: Lungs clear to auscultation normal effort Cardiovascular regular rate and rhythm no murmurs gallops rubs  Supple spirometry today: Completely normal, no evidence of COPD  Pathologic diagnosis from bronchoscopy last week showed adenocarcinoma  Adenocarcinoma of the lung: Hopefully this will just be limited to the right upper lobe but we need to have a PET scan, he will follow-up with oncology afterwards  Tobacco abuse: Advised at length to quit smoking today  I'm encouraged by the fact that today spirometry showed no evidence of COPD  Greater than 50% of today's 35 minute visit spent face-to-face with the patient and his wife  Follow-up with [place] on a when necessary basis  Roselie Awkward, MD [facility] Pager: [phone] Cell: [phone] After 3pm or if no response, call [phone]

## 2015-10-02 ENCOUNTER — Telehealth: Payer: Self-pay | Admitting: *Deleted

## 2015-10-02 NOTE — Telephone Encounter (Signed)
Oncology Nurse Navigator Documentation  Oncology Nurse Navigator Flowsheets 10/02/2015  Navigator Encounter Type Telephone/called left vm message to call with my name and phone number   Telephone Outgoing Call  Treatment Phase Pre-Tx/Tx Discussion  Barriers/Navigation Needs Coordination of Care  Interventions Coordination of Care  Coordination of Care Appts  Acuity Level 2  Acuity Level 2 Assistance expediting appointments  Time Spent with Patient 15

## 2015-10-03 ENCOUNTER — Ambulatory Visit (HOSPITAL_COMMUNITY)
Admission: RE | Admit: 2015-10-03 | Discharge: 2015-10-03 | Disposition: A | Discharge status: Home / Self Care | Payer: Medicare Other | Source: Ambulatory Visit | Attending: Pulmonary Disease | Admitting: Pulmonary Disease

## 2015-10-03 DIAGNOSIS — I2510 Atherosclerotic heart disease of native coronary artery without angina pectoris: Secondary | ICD-10-CM | POA: Diagnosis not present

## 2015-10-03 DIAGNOSIS — N400 Enlarged prostate without lower urinary tract symptoms: Secondary | ICD-10-CM | POA: Diagnosis not present

## 2015-10-03 DIAGNOSIS — C3491 Malignant neoplasm of unspecified part of right bronchus or lung: Secondary | ICD-10-CM | POA: Diagnosis present

## 2015-10-03 DIAGNOSIS — C771 Secondary and unspecified malignant neoplasm of intrathoracic lymph nodes: Secondary | ICD-10-CM | POA: Diagnosis not present

## 2015-10-03 DIAGNOSIS — C3411 Malignant neoplasm of upper lobe, right bronchus or lung: Secondary | ICD-10-CM | POA: Diagnosis not present

## 2015-10-03 DIAGNOSIS — I700 Atherosclerosis of aorta: Secondary | ICD-10-CM | POA: Insufficient documentation

## 2015-10-03 DIAGNOSIS — R590 Localized enlarged lymph nodes: Secondary | ICD-10-CM | POA: Diagnosis not present

## 2015-10-03 LAB — GLUCOSE, CAPILLARY: Glucose-Capillary: 159 mg/dL — ABNORMAL HIGH (ref 65–99)

## 2015-10-03 MED ORDER — FLUDEOXYGLUCOSE F - 18 (FDG) INJECTION
6.7000 | Freq: Once | INTRAVENOUS | Status: AC | PRN
  Administered 2015-10-03: 6.7 via INTRAVENOUS

## 2015-10-05 ENCOUNTER — Ambulatory Visit: Payer: Medicare Other | Attending: Internal Medicine | Admitting: Physical Therapy

## 2015-10-05 ENCOUNTER — Encounter: Payer: Self-pay | Admitting: *Deleted

## 2015-10-05 ENCOUNTER — Other Ambulatory Visit (HOSPITAL_BASED_OUTPATIENT_CLINIC_OR_DEPARTMENT_OTHER): Payer: Medicare Other

## 2015-10-05 ENCOUNTER — Institutional Professional Consult (permissible substitution) (INDEPENDENT_AMBULATORY_CARE_PROVIDER_SITE_OTHER): Payer: Medicare Other | Admitting: Thoracic Surgery (Cardiothoracic Vascular Surgery)

## 2015-10-05 ENCOUNTER — Ambulatory Visit
Admission: RE | Admit: 2015-10-05 | Discharge: 2015-10-05 | Disposition: A | Discharge status: Home / Self Care | Payer: Medicare Other | Source: Ambulatory Visit | Attending: Radiation Oncology | Admitting: Radiation Oncology

## 2015-10-05 ENCOUNTER — Encounter: Payer: Self-pay | Admitting: Thoracic Surgery (Cardiothoracic Vascular Surgery)

## 2015-10-05 ENCOUNTER — Ambulatory Visit (HOSPITAL_BASED_OUTPATIENT_CLINIC_OR_DEPARTMENT_OTHER): Payer: Medicare Other | Admitting: Internal Medicine

## 2015-10-05 ENCOUNTER — Other Ambulatory Visit: Payer: Self-pay

## 2015-10-05 ENCOUNTER — Encounter: Payer: Self-pay | Admitting: Internal Medicine

## 2015-10-05 ENCOUNTER — Telehealth: Payer: Self-pay | Admitting: *Deleted

## 2015-10-05 VITALS — BP 120/66 | HR 68 | Temp 98.1°F | Resp 17 | Ht 67.0 in | Wt 137.7 lb

## 2015-10-05 DIAGNOSIS — Z5111 Encounter for antineoplastic chemotherapy: Secondary | ICD-10-CM | POA: Insufficient documentation

## 2015-10-05 DIAGNOSIS — R2681 Unsteadiness on feet: Secondary | ICD-10-CM | POA: Insufficient documentation

## 2015-10-05 DIAGNOSIS — C3491 Malignant neoplasm of unspecified part of right bronchus or lung: Secondary | ICD-10-CM

## 2015-10-05 DIAGNOSIS — R51 Headache: Secondary | ICD-10-CM | POA: Diagnosis not present

## 2015-10-05 DIAGNOSIS — E119 Type 2 diabetes mellitus without complications: Secondary | ICD-10-CM | POA: Diagnosis not present

## 2015-10-05 DIAGNOSIS — R918 Other nonspecific abnormal finding of lung field: Secondary | ICD-10-CM

## 2015-10-05 DIAGNOSIS — Z720 Tobacco use: Secondary | ICD-10-CM

## 2015-10-05 DIAGNOSIS — M79605 Pain in left leg: Secondary | ICD-10-CM | POA: Diagnosis not present

## 2015-10-05 DIAGNOSIS — C3411 Malignant neoplasm of upper lobe, right bronchus or lung: Secondary | ICD-10-CM | POA: Diagnosis not present

## 2015-10-05 DIAGNOSIS — G622 Polyneuropathy due to other toxic agents: Secondary | ICD-10-CM | POA: Diagnosis not present

## 2015-10-05 HISTORY — DX: Encounter for antineoplastic chemotherapy: Z51.11

## 2015-10-05 LAB — COMPREHENSIVE METABOLIC PANEL
ALT: 9 U/L (ref 0–55)
AST: 9 U/L (ref 5–34)
Albumin: 3.2 g/dL — ABNORMAL LOW (ref 3.5–5.0)
Alkaline Phosphatase: 112 U/L (ref 40–150)
Anion Gap: 7 mEq/L (ref 3–11)
BUN: 8.8 mg/dL (ref 7.0–26.0)
CO2: 28 mEq/L (ref 22–29)
Calcium: 9.1 mg/dL (ref 8.4–10.4)
Chloride: 105 mEq/L (ref 98–109)
Creatinine: 0.8 mg/dL (ref 0.7–1.3)
EGFR: >90 mL/min/{1.73_m2} (ref >90)
Glucose: 256 mg/dl — ABNORMAL HIGH (ref 70–140)
Potassium: 3.9 mEq/L (ref 3.5–5.1)
Sodium: 140 mEq/L (ref 136–145)
Total Bilirubin: <0.3 mg/dL (ref 0.20–1.20)
Total Protein: 6.9 g/dL (ref 6.4–8.3)

## 2015-10-05 LAB — CBC WITH DIFFERENTIAL/PLATELET
BASO%: 0.5 % (ref 0.0–2.0)
Basophils Absolute: 0 10*3/uL (ref 0.0–0.1)
EOS%: 2.8 % (ref 0.0–7.0)
Eosinophils Absolute: 0.2 10*3/uL (ref 0.0–0.5)
HCT: 37.5 % — ABNORMAL LOW (ref 38.4–49.9)
HGB: 12.5 g/dL — ABNORMAL LOW (ref 13.0–17.1)
LYMPH%: 27.4 % (ref 14.0–49.0)
MCH: 30.6 pg (ref 27.2–33.4)
MCHC: 33.3 g/dL (ref 32.0–36.0)
MCV: 91.7 fL (ref 79.3–98.0)
MONO#: 0.6 10*3/uL (ref 0.1–0.9)
MONO%: 7.3 % (ref 0.0–14.0)
NEUT#: 4.7 10*3/uL (ref 1.5–6.5)
NEUT%: 62 % (ref 39.0–75.0)
Platelets: 292 10*3/uL (ref 140–400)
RBC: 4.09 10*6/uL — ABNORMAL LOW (ref 4.20–5.82)
RDW: 13.1 % (ref 11.0–14.6)
WBC: 7.5 10*3/uL (ref 4.0–10.3)
lymph#: 2.1 10*3/uL (ref 0.9–3.3)

## 2015-10-05 MED ORDER — PROCHLORPERAZINE MALEATE 10 MG PO TABS: 10.0000 mg | ORAL_TABLET | Freq: Four times a day (QID) | ORAL | 0 refills | Status: DC | PRN

## 2015-10-05 NOTE — Progress Notes (Signed)
PCP is [name], MD Referring Provider is Curt Bears, MD  No chief complaint on file.   HPI: 55 yo man sent to [facility] for evaluation of right upper lobe carcinoma.  Brett [name] is a 55 yo man with a multitude of medical issues including tobacco abuse, Barrett's esophagus, PAD (carotid and femoral stents), history of CHI with cognitive impairment, fibromaylgia, chronic pain, major depression and type II diabetes without complication.  He recently injured his left shoulder trying to move something. Xrays were done which showed an abnormality in the right lung. A CT showed a right upper lobe mass. PET showed the mass was hypermetabolic. There was also a mildly hypermetabolic hilar node. Dr. Lake Bells did a bronchoscopy and biopsies showed a non-small cell carcinoma.  He is still smoking, now down to less than 1/2 ppd. He gets short of breath with heavy exertion but not routine activities. He has not had significant weight loss. No recent change in HA or vision. He has a frequent cough but denies hemoptysis. Denies wheezing.  Zubrod Score: At the time of surgery this patient's most appropriate activity status/level should be described as: '[]'$     0    Normal activity, no symptoms '[x]'$     1    Restricted in physical strenuous activity but ambulatory, able to do out light work '[]'$     2    Ambulatory and capable of self care, unable to do work activities, up and about >50 % of waking hours                              '[]'$     3    Only limited self care, in bed greater than 50% of waking hours '[]'$     4    Completely disabled, no self care, confined to bed or chair '[]'$     5    Moribund  Past Medical History:  Diagnosis Date  . Barrett's esophagus 07/01/2013   Without dysplasia on biopsy 09/03/2012. Repeat EGD recommended 08/2015  . Carotid artery stenosis 07/01/2013   Requiring right sided stent   . Chronic pain syndrome 07/01/2013  . Closed head injury with brief loss of consciousness ([place])  07/22/2010   Head trapped in a hydraulic device at work.  Fracture of orbital bones on right and brief loss of consciousness per report.  . 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 There was likely a strong psycho-social overlay in regard to the cognitive deficits in the form of mood disorder with psychotic features and mixed anxiety symptomatology. His primary tested cognitive deficits are in the areas of attention, executi  . Degenerative joint disease of cervical spine 07/01/2013  . Dupuytren's contracture of both hands 04/08/2014  . Encounter for antineoplastic chemotherapy 10/05/2015  . Erectile dysfunction associated with type 2 diabetes mellitus ([place]) 07/01/2013  . Fibromyalgia 07/01/2013  . Hyperlipidemia LDL goal < 100 07/01/2013  . Osteoarthritis of right thumb 10/21/2014  . Peripheral vascular occlusive disease ([place]) 07/01/2013   Requiring 2 arterial stents above the left knee per report  . Post traumatic stress disorder 07/01/2013  . Primary lung adenocarcinoma ([place])   . Severe major depression with psychotic features ([place]) 04/15/2011  . Tobacco abuse 07/01/2013  . Type 2 diabetes mellitus with vascular disease ([place]) 07/01/2013   Left lower extremity and right  carotid stenting    Past Surgical History:  Procedure Laterality Date  . FRACTURE SURGERY Right July 2012   Right orbital bones  . HERNIA REPAIR  [id number]   Umbilical  . VIDEO BRONCHOSCOPY Bilateral 09/21/2015   Procedure: VIDEO BRONCHOSCOPY WITH FLUORO;  Surgeon: Juanito Doom, MD;  Location: [facility];  Service: Cardiopulmonary;  Laterality: Bilateral;    Family History  Problem Relation Age of Onset  . Heart disease Mother   . Diabetes Mother   . Diabetes Father   . Heart attack Father   . Diabetes Sister   . Coronary artery disease Sister     s/p CABG  . Diabetes Brother   .  Healthy Daughter   . Healthy Son   . Diabetes Sister   . Healthy Sister   . Healthy Sister   . Diabetes Brother   . Coronary artery disease Brother     s/p CABG  . Diabetes Brother   . Healthy Brother   . Healthy Brother   . Healthy Daughter     Social History Social History  Substance Use Topics  . Smoking status: Current Every Day Smoker    Packs/day: 1.00    Years: 35.00    Types: Cigarettes  . Smokeless tobacco: Never Used     Comment: down to .5ppd 09/29/15  . Alcohol use No    Current Outpatient Prescriptions  Medication Sig Dispense Refill  . aspirin EC 81 MG tablet Take 81 mg by mouth daily.    Marland Kitchen atorvastatin (LIPITOR) 40 MG tablet Take 1 tablet (40 mg total) by mouth daily. 90 tablet 3  . clopidogrel (PLAVIX) 75 MG tablet Take 75 mg by mouth daily with breakfast.    . glipiZIDE (GLUCOTROL) 10 MG tablet Take 2 tablets (20 mg total) by mouth 2 (two) times daily before a meal. 360 tablet 3  . glucose blood (ACCU-CHEK AVIVA PLUS) test strip 1 each by Other route as needed for other (twice daily). 100 each 5  . HYDROcodone-acetaminophen (NORCO) 10-325 MG tablet Take 1 tablet by mouth every 6 (six) hours as needed for severe pain. 90 tablet 0  . lisinopril (PRINIVIL,ZESTRIL) 5 MG tablet Take 1 tablet (5 mg total) by mouth daily. 90 tablet 3  . metFORMIN (GLUCOPHAGE) 500 MG tablet Take 1 tablet (500 mg total) by mouth 2 (two) times daily with a meal. 180 tablet 3  . pantoprazole (PROTONIX) 40 MG tablet Take 1 tablet (40 mg total) by mouth daily. 90 tablet 3  . pregabalin (LYRICA) 50 MG capsule Take 1 capsule (50 mg total) by mouth 3 (three) times daily. 90 capsule 11  . prochlorperazine (COMPAZINE) 10 MG tablet Take 1 tablet (10 mg total) by mouth every 6 (six) hours as needed for nausea or vomiting. 30 tablet 0  . sitaGLIPtin (JANUVIA) 100 MG tablet Take 1 tablet (100 mg total) by mouth daily. 90 tablet 3   No current facility-administered medications for this visit.      Allergies  Allergen Reactions  . Celebrex [Celecoxib]     Nightmares  . Gabapentin Other (See Comments)    suicidal thoughts    Review of Systems  Constitutional: Negative for activity change, appetite change and unexpected weight change.  HENT: Negative for trouble swallowing and voice change.   Respiratory: Positive for cough. Negative for shortness of breath and wheezing.   Cardiovascular: Negative for chest pain, palpitations and leg swelling.  Gastrointestinal: Negative for abdominal pain and blood in stool.  Endocrine: Negative for polydipsia and polyphagia.  Genitourinary: Negative for difficulty urinating and dysuria.  Musculoskeletal: Positive for arthralgias (left shoulder), back pain, neck pain and neck stiffness. Negative for joint swelling.       Chronic pain, left leg  Neurological: Positive for headaches.  Hematological: Negative for adenopathy. Does not bruise/bleed easily.  Psychiatric/Behavioral:       PTSD    There were no vitals taken for this visit. Physical Exam  Constitutional: He is oriented to person, place, and time. He appears well-developed and well-nourished. No distress.  HENT:  Head: Normocephalic and atraumatic.  Mouth/Throat: No oropharyngeal exudate.  Eyes: Conjunctivae and EOM are normal. No scleral icterus.  Neck: Neck supple. No tracheal deviation present.  Cardiovascular: Normal rate and regular rhythm.  Exam reveals no gallop and no friction rub.   No murmur heard. Pulmonary/Chest: Effort normal and breath sounds normal. No respiratory distress. He has no wheezes. He has no rales.  Abdominal: Soft. He exhibits no distension. There is no tenderness.  Musculoskeletal: He exhibits no edema.  Lymphadenopathy:    He has no cervical adenopathy.  Neurological: He is alert and oriented to person, place, and time. A cranial nerve deficit is present.  No motor deficit  Skin: Skin is warm and dry.  Psychiatric: He has a normal mood and  affect.     Diagnostic Tests: CT CHEST WITH CONTRAST  TECHNIQUE: Multidetector CT imaging of the chest was performed during intravenous contrast administration.  CONTRAST:  74m ISOVUE-300 IOPAMIDOL (ISOVUE-300) INJECTION 61%  COMPARISON:  Chest radiographs 08/25/2015. [facility] CT Abdomen and Pelvis report 08/25/2012 (no images available).  FINDINGS: Spiculated peri-bronchovascular lesion in the right upper lobe corresponding to the recent plain radiographic finding by largest dimension measures nearly 4 cm (as seen on series 205, image 51), with the dominant complement of the lesion measuring 2 cm (coronal image 71). Although somewhat linearly arranged, the lesion does not appear to correspond to an opacified airway. Right hilar lymph node tissue appears asymmetrically increased, up to 9 mm short axis (series 21, image 58).  Major airways are patent bilaterally. No pleural effusion. No pericardial effusion. Minor scarring in the left lateral costophrenic angle.  Negative thoracic inlet. No axillary lymphadenopathy. Soft atherosclerotic plaque of the thoracic aorta. Calcified coronary artery atherosclerosis. Mediastinal and left hilar lymph nodes remain normal other major mediastinal vascular structures are within normal limits.  Negative visible liver, gallbladder, spleen, pancreas, adrenal glands, renal upper poles, and bowel in the upper abdomen.  No acute or suspicious osseous lesion identified.  IMPRESSION: 1. Spiculated and somewhat elongated right upper lobe lesion (2-4 cm) corresponding to the recent radiographic finding should be considered Bronchogenic Carcinoma unless proven otherwise. 2. Asymmetric right hilar lymph node is suspicious for metastatic nodal disease. 3. Thoracic Surgery consultation and/or PET-CT would be most appropriate at this time.  Electronically Signed: By: HGenevie AnnM.D. On: 09/01/2015 16:50  NUCLEAR MEDICINE  PET SKULL BASE TO THIGH  TECHNIQUE: 6.7 mCi F-18 FDG was injected intravenously. Full-ring PET imaging was performed from the skull base to thigh after the radiotracer. CT data was obtained and used for attenuation correction and anatomic localization.  FASTING BLOOD GLUCOSE:  Value: 159 mg/dl  COMPARISON:  09/01/2015 chest CT.  09/21/2004 CT abdomen/ pelvis.  FINDINGS: NECK  No hypermetabolic lymph nodes in the neck. Right carotid artery stent is noted.  CHEST  Hypermetabolic irregular 2.3 x 1.3 cm posterior right upper lobe pulmonary nodule with max SUV 8.7 (  series 8/image 27).  No acute consolidative airspace disease or additional significant pulmonary nodules.  Mildly enlarged hypermetabolic 1.1 cm right hilar node with max SUV 4.1 (series 4/image 73).  There are a few clustered nonenlarged hypermetabolic right axillary nodes, for example a 0.7 cm right axillary node with max SUV 4.3 (series 4/image 53).  No hypermetabolic mediastinal, left hilar or left axillary nodes.  Left anterior descending, left circumflex and right coronary atherosclerosis. Atherosclerotic nonaneurysmal thoracic aorta. No pleural effusions.  ABDOMEN/PELVIS  No abnormal hypermetabolic activity within the liver, pancreas, adrenal glands, or spleen. No hypermetabolic lymph nodes in the abdomen or pelvis. Mildly enlarged prostate with coarse nonspecific internal prostatic calcifications. Atherosclerotic nonaneurysmal abdominal aorta.  SKELETON  No focal hypermetabolic activity to suggest skeletal metastasis.  IMPRESSION: 1. Hypermetabolic irregular 2.3 cm posterior right upper lobe pulmonary nodule, consistent with primary bronchogenic carcinoma. 2. Hypermetabolic right hilar nodal metastasis. 3. Clustered non-enlarged hypermetabolic right axillary lymph nodes, nonspecific, favor benign injection related uptake (radiotracer was injected into a right antecubital vein).  Consider targeted ultrasound evaluation of the right axillary nodes. 4. No extrathoracic hypermetabolic metastatic disease. 5. Additional findings include aortic atherosclerosis, three-vessel coronary atherosclerosis and mildly enlarged prostate.   Electronically Signed   By: Ilona Sorrel M.D.   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 This is either stage I or IIA based on PET. There is a right hilar node but it may just be reactive. Regardless of whether the node is positive or not, I believe the disease is resectable with a right upper lobectomy. Whether adjuvant therapy is needed will depend on the final pathology.  I recommended we proceed with Right VATS for right upper lobectomy. I discussed the general nature of the procedure, the need for general anesthesia, and the incisions to be used with the patient and his family. We discussed the expected hospital stay, overall recovery and short and long term outcomes. I informed them of the indications, risks, benefits, and alternatives. They understand the risks include, but are not limited to death, stroke, MI, DVT/PE, bleeding, possible need for transfusion, infections, prolonged air leaks, cardiac arrhythmias, as well as the possibility of other unforeseeable complications.  He accepts the risks and agrees to proceed.  Tobacco abuse- Smoking cessation instruction/counseling given:  counseled patient on the dangers of tobacco use, advised patient to stop smoking, and reviewed strategies to maximize success  PVD with carotid and femoral stents- on plavix. He will need to hold plavix for 5 days prior to surgery.  CAD- calcification noted on CT and PET. No symptoms, no need for additional workup  preop, increased risk but not prohibitive  Plan:  Right VATS, right upper lobectomy when schedule allows, likely week of 9/25 DC plavix 5 days preop   Melrose Nakayama, MD [facility] [phone]

## 2015-10-05 NOTE — Telephone Encounter (Signed)
Oncology Nurse Navigator Documentation  Oncology Nurse Navigator Flowsheets 10/05/2015  Navigator Encounter Type Telephone/I called to update patient regarding cancer conference discussion.  I was unable to reach.   Telephone Outgoing Call  Treatment Phase Pre-Tx/Tx Discussion  Barriers/Navigation Needs Education  Education Other  Interventions Education Method  Acuity Level 1  Time Spent with Patient 15

## 2015-10-05 NOTE — Therapy (Signed)
[place], [place], [address] Phone: [phone]   Fax:  [phone]  Physical Therapy Evaluation  Patient Details  Name: Brett [name] MRN: [id number] Date of Birth: 1960/04/20 Referring Provider: Dr. Curt Bears  Encounter Date: 10/05/2015      PT End of Session - 10/05/15 1733    Visit Number 1   Number of Visits 1   PT Start Time 1701   PT Stop Time 1721   PT Time Calculation (min) 20 min   Activity Tolerance Patient tolerated treatment well   Behavior During Therapy [facility] for tasks assessed/performed      Past Medical History:  Diagnosis Date  . Barrett's esophagus 07/01/2013   Without dysplasia on biopsy 09/03/2012. Repeat EGD recommended 08/2015  . Carotid artery stenosis 07/01/2013   Requiring right sided stent   . Chronic pain syndrome 07/01/2013  . Closed head injury with brief loss of consciousness ([place]) 07/22/2010   Head trapped in a hydraulic device at work.  Fracture of orbital bones on right and brief loss of consciousness per report.  . 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 There was likely a strong psycho-social overlay in regard to the cognitive deficits in the form of mood disorder with psychotic features and mixed anxiety symptomatology. His primary tested cognitive deficits are in the areas of attention, executi  . Degenerative joint disease of cervical spine 07/01/2013  . Dupuytren's contracture of both hands 04/08/2014  . Encounter for antineoplastic chemotherapy 10/05/2015  . Erectile dysfunction associated with type 2 diabetes mellitus ([place]) 07/01/2013  . Fibromyalgia 07/01/2013  . Hyperlipidemia LDL goal < 100 07/01/2013  . Osteoarthritis of right thumb 10/21/2014  . Peripheral vascular occlusive disease ([place]) 07/01/2013   Requiring 2  arterial stents above the left knee per report  . Post traumatic stress disorder 07/01/2013  . Primary lung adenocarcinoma ([place])   . Severe major depression with psychotic features ([place]) 04/15/2011  . Tobacco abuse 07/01/2013  . Type 2 diabetes mellitus with vascular disease ([place]) 07/01/2013   Left lower extremity and right carotid stenting    Past Surgical History:  Procedure Laterality Date  . FRACTURE SURGERY Right July 2012   Right orbital bones  . HERNIA REPAIR  7371   Umbilical  . VIDEO BRONCHOSCOPY Bilateral 09/21/2015   Procedure: VIDEO BRONCHOSCOPY WITH FLUORO;  Surgeon: Juanito Doom, MD;  Location: [facility];  Service: Cardiopulmonary;  Laterality: Bilateral;    There were no vitals filed for this visit.       Subjective Assessment - 10/05/15 1722    Subjective Patient unable to say much.   Patient is accompained by: Family member  wife and 2 daughters   Pertinent History Patient diagnosed with 4 cm right upper lobe non-small cell carcinoma nodule with reactive right anterior hilar node. Current plan is resection. Patient is a smoker with history of closed head injury in 2012 with cognitive and psychiatric impairments, carotid artery stent, DJD of cervical spine, fibromyalgia, hyperlipidemia, OA, PTSD, diabetes type II, and peripheral vascual occlusive disease.    Patient Stated Goals to obtain information from lung multidisciplinary clinic providers   Currently in Pain? Yes   Pain Score 7    Pain Location Head   Pain Descriptors / Indicators Constant   Pain Type Chronic pain   Pain Onset More than a month ago  Pain Frequency Constant   Aggravating Factors  nothing   Pain Relieving Factors medicine   Multiple Pain Sites Yes   Pain Score 7   Pain Location Leg   Pain Orientation Left   Pain Descriptors / Indicators Constant   Pain Type Chronic pain   Pain Onset More than a month ago   Pain Frequency Constant   Aggravating Factors  nothing   Pain Relieving  Factors medicine            [facility] PT Assessment - 10/05/15 0001      Assessment   Medical Diagnosis RUL non small cell carcinoma   Referring Provider Dr. Curt Bears     Precautions   Precaution Comments cancer precautions     Restrictions   Weight Bearing Restrictions No     Balance Screen   Has the patient fallen in the past 6 months No   Has the patient had a decrease in activity level because of a fear of falling?  No   Is the patient reluctant to leave their home because of a fear of falling?  No     Home Social worker Private residence   Living Arrangements Spouse/significant other;Children   Available Help at Discharge Family   Type of Box to enter   Entrance Stairs-Number of Steps 3   [place] One level     Prior Function   Level of Independence Independent   Leisure no regular exercise     Cognition   Overall Cognitive Status History of cognitive impairments - at baseline     Functional Tests   Functional tests Sit to Stand     Sit to Stand   Comments able to perform 7 repetitions during 30 second sit to stand, below average for age     Posture/Postural Control   Posture Comments mildly rounded shoulders     ROM / Strength   AROM / PROM / Strength AROM     AROM   Overall AROM Comments standing trunk AROM WFL for flexion, extension, and bilateral rotation, 25% limited for bilateral sidebending     Balance   Balance Assessed Yes     Dynamic Standing Balance   Dynamic Standing - Comments able to reach 10.5 inches during standing Functional Reach, below average for age                           PT Education - 10/05/15 1732    Education provided Yes   Education Details energy conservation, walking program, Cure article, posture, deep breathing, cough splinting, physical therapy information   Person(s) Educated Patient;Spouse   Methods Explanation;Demonstration;Handout    Comprehension Verbalized understanding               Lung Clinic Goals - 10/05/15 1742      Patient will be able to verbalize understanding of the benefit of exercise to decrease fatigue.   Status Achieved     Patient will be able to verbalize the importance of posture.   Status Achieved     Patient will be able to demonstrate diaphragmatic breathing for improved lung function.   Status Achieved     Patient will be able to verbalize understanding of the role of physical therapy to prevent functional decline and who to contact if physical therapy is needed.   Status Achieved  Plan - 10/05/15 1734    Clinical Impression Statement Patient is a 55 year old male diagnosed with non small cell carcinoma of the right upper lobe with a reactive right anterior hilar node. Current plan is resection. Patient has a history of a closed head injury with remaining cognitive and psychiatric impairments. Patient's wife was present and able to answer many questions for the patient. Patient demonstrated decreased trunk sidebending AROM, decreased standing balance with standing Functional Reach, and fewer than average repetitions during 30 second sit to stand. He reports he is independent with all mobility. Patient and his wife were instructed to inform his doctor should he have any needs for physical therapy.   PT Treatment/Interventions ADLs/Self Care Home Management;Patient/family education   PT Next Visit Plan Patient has no additional needs from therapy at this time   Consulted and Agree with Plan of Care Patient;Family member/caregiver   Family Member Consulted patient's wife      Patient will benefit from skilled therapeutic intervention in order to improve the following deficits and impairments:  Decreased balance, Decreased cognition, Decreased range of motion, Pain  Visit Diagnosis: Unsteadiness on feet  Pain In Left Leg     Problem List Patient Active Problem List    Diagnosis Date Noted  . Encounter for antineoplastic chemotherapy 10/05/2015  . Primary lung adenocarcinoma ([place]) 08/25/2015  . Osteoarthritis of right thumb 10/21/2014  . Dupuytren's contracture of both hands 04/08/2014  . Healthcare maintenance 10/08/2013  . Post traumatic stress disorder 07/02/2013  . Type 2 diabetes mellitus with vascular disease ([place]) 07/01/2013  . Peripheral vascular occlusive disease ([place]) 07/01/2013  . 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 Erectile dysfunction associated with type 2 diabetes mellitus ([place]) 07/01/2013  . Hyperlipidemia 07/01/2013  . Tobacco abuse 07/01/2013  . Fibromyalgia 07/01/2013  . Degenerative joint disease of cervical spine 07/01/2013  . Barrett's esophagus 07/01/2013  . Chronic pain syndrome 07/01/2013  . Severe major depression with psychotic features ([place]) 04/15/2011  . Cognitive disorder 04/15/2011  . Closed head injury with brief loss of consciousness [facility]) 08/10/2010    Mellody Life, SPT 10/05/2015, 5:43 PM  [place], [place], [address] Phone: [phone]   Fax:  [phone]   Name: Brett [name] MRN: [id number] Date of Birth: [birth year]  This entire session was guided, instructed, and directly supervised by Serafina Royals, PT.  Read, reviewed, edited and agree with student's findings and recommendations.   Serafina Royals, PT 10/05/15 5:53 PM
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
## 2015-10-05 NOTE — Progress Notes (Signed)
[place] Clinical Social Work  Clinical Social Work met with patient/family at [employer] appointment to offer support and assess for psychosocial needs.  Patient was accompanied by his spouse and two daughters.  Mr. Lipford shared he is unsure what his concerns are at this time.  Patient's spouse shared her main concern was "holding the family together".  Patient's spouse shared she feels the patient needs to quit smoking, but knows it is the only thing that relieves his anxiety.  CSW explored other coping skills to deal with anxiety.  CSW and patient/family briefly discussed financial resources during treatment.  ONCBCN DISTRESS SCREENING 10/05/2015  Screening Type Initial Screening  Distress experienced in past week (1-10) 7  Emotional problem type Depression;Nervousness/Anxiety;Adjusting to illness;Feeling hopeless;Boredom  Physical Problem type Pain;Sleep/insomnia;Loss of appetitie  Referral to clinical social work Yes    Clinical Social Work briefly discussed Clinical Social Work role and [employer] support programs/services.  Clinical Social Work encouraged patient to call with any additional questions or concerns.   Polo Riley, MSW, LCSW, OSW-C Clinical Social Worker [facility] [phone]

## 2015-10-05 NOTE — Progress Notes (Signed)
Radiation Oncology         [phone] ________________________________  Initial Outpatient Consultation  Name: Brett [name] MRN: [id number]  Date: 10/05/2015  DOB: [birth year]  IE:PPIRJ, Damita Lack, MD  [name], MD   REFERRING PHYSICIAN: [name], MD  DIAGNOSIS: The encounter diagnosis was Primary lung adenocarcinoma, right [facility]).   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 male who presented to his PCP on 08/25/15 regarding left chest, shoulder, and arm pain. Chest X-ray at that time revealed a density in the right upper lobe and the left lung was clear. CT scan of the chest on 09/01/15 revealed a 2-4 cm spiculated and somewhat elongated RUL lesion and an asymmetric right hilar lymph node measuring 0.9 cm suspicious for metastatic nodal disease.  The patient was subsequently referred to Dr. Lake Bells on 09/14/15 who recommended a bronchoscopy. Biopsy of the RUL on 09/21/15 reveal poorly differentiated non-small cell carcinoma and adenocarcinoma was favored. A RUL brushing at the time revealed atypical cells and bronchial lavage of the RUL revealed no malignant cells.  PET scan on 10/03/15 showed an hypermetabolic nodule in the RUL measuring 2.3 x 1.3 cm with SV max of 8.7, a hypermetabolic right hilar node measuring 1.1 cm with SUV max 4.1, and a few clustered nonenlarged hypermetabolic right axillary nodes.  The patient, his wife, and two children present today in multidisciplinary thoracic clinic to discuss treatment options for the management of his disease.  PREVIOUS RADIATION THERAPY: No  PAST MEDICAL HISTORY:  has a past medical history of Barrett's esophagus (07/01/2013); Carotid artery stenosis (07/01/2013); Chronic pain syndrome (07/01/2013); Closed head injury with brief loss of consciousness ([place]) (07/22/2010); Cognitive disorder (04/15/2011); Degenerative joint disease of cervical spine  (07/01/2013); Dupuytren's contracture of both hands (04/08/2014); Encounter for antineoplastic chemotherapy (10/05/2015); Erectile dysfunction associated with type 2 diabetes mellitus ([place]) (07/01/2013); Fibromyalgia (07/01/2013); Hyperlipidemia LDL goal < 100 (07/01/2013); Osteoarthritis of right thumb (10/21/2014); Peripheral vascular occlusive disease ([place]) (07/01/2013); Post traumatic stress disorder (07/01/2013); Primary lung adenocarcinoma ([place]); Severe major depression with psychotic features ([place]) (04/15/2011); Tobacco abuse (07/01/2013); and Type 2 diabetes mellitus with vascular disease ([place]) (07/01/2013).    PAST SURGICAL HISTORY: Past Surgical History:  Procedure Laterality Date  . FRACTURE SURGERY Right July 2012   Right orbital bones  . HERNIA REPAIR  1884   Umbilical  . VIDEO BRONCHOSCOPY Bilateral 09/21/2015   Procedure: VIDEO BRONCHOSCOPY WITH FLUORO;  Surgeon: Juanito Doom, MD;  Location: [facility];  Service: Cardiopulmonary;  Laterality: Bilateral;    FAMILY HISTORY: family history includes Coronary artery disease in his brother and sister; Diabetes in his brother, brother, brother, father, mother, sister, and sister; Healthy in his brother, brother, daughter, daughter, sister, sister, and son; Heart attack in his father; Heart disease in his mother.  SOCIAL HISTORY:  reports that he has been smoking Cigarettes.  He has a 35.00 pack-year smoking history. He has never used smokeless tobacco. He reports that he does not drink alcohol or use drugs.  ALLERGIES: Celebrex [celecoxib] and Gabapentin  MEDICATIONS:  Current Outpatient Prescriptions  Medication Sig Dispense Refill  . aspirin EC 81 MG tablet Take 81 mg by mouth daily.    Marland Kitchen atorvastatin (LIPITOR) 40 MG tablet Take 1 tablet (40 mg total) by mouth daily. 90 tablet 3  . clopidogrel (PLAVIX) 75 MG tablet Take 75 mg by mouth daily with breakfast.    . glipiZIDE (  GLUCOTROL) 10 MG tablet Take 2 tablets (20 mg total) by mouth 2  (two) times daily before a meal. 360 tablet 3  . glucose blood (ACCU-CHEK AVIVA PLUS) test strip 1 each by Other route as needed for other (twice daily). 100 each 5  . HYDROcodone-acetaminophen (NORCO) 10-325 MG tablet Take 1 tablet by mouth every 6 (six) hours as needed for severe pain. 90 tablet 0  . lisinopril (PRINIVIL,ZESTRIL) 5 MG tablet Take 1 tablet (5 mg total) by mouth daily. 90 tablet 3  . metFORMIN (GLUCOPHAGE) 500 MG tablet Take 1 tablet (500 mg total) by mouth 2 (two) times daily with a meal. 180 tablet 3  . pantoprazole (PROTONIX) 40 MG tablet Take 1 tablet (40 mg total) by mouth daily. 90 tablet 3  . pregabalin (LYRICA) 50 MG capsule Take 1 capsule (50 mg total) by mouth 3 (three) times daily. 90 capsule 11  . prochlorperazine (COMPAZINE) 10 MG tablet Take 1 tablet (10 mg total) by mouth every 6 (six) hours as needed for nausea or vomiting. 30 tablet 0  . sitaGLIPtin (JANUVIA) 100 MG tablet Take 1 tablet (100 mg total) by mouth daily. 90 tablet 3   No current facility-administered medications for this encounter.     REVIEW OF SYSTEMS:  A 15 point review of systems is documented in the electronic medical record. This was obtained by the nursing staff. However, I reviewed this with the patient to discuss relevant findings and make appropriate changes.  Pertinent items noted in HPI and remainder of comprehensive ROS otherwise negative.   The patient denies hemoptysis.   PHYSICAL EXAM:  Vitals with BMI 10/05/2015  Height '5\' 7"'$   Weight 137 lbs 11 oz  BMI 98.9  Systolic 211  Diastolic 66  [name] 68  Respirations 17  General: Alert and oriented, in no acute distress HEENT: Head is normocephalic. Extraocular movements are intact. Oropharynx is clear. The patient has top dentures, but does not wear any on the bottom. Neck: Neck is supple, no palpable cervical or supraclavicular lymphadenopathy. Heart: Regular in rate and rhythm with no murmurs, rubs, or gallops. Chest: Clear to  auscultation bilaterally, with no rhonchi, wheezes, or rales. Abdomen: Soft, nontender, nondistended, with no rigidity or guarding. Extremities: No cyanosis or edema. Lymphatics: see Neck Exam Skin: No concerning lesions. Musculoskeletal: symmetric strength and muscle tone throughout. Neurologic: Cranial nerves II through XII are grossly intact. No obvious focalities. Speech is fluent. Coordination is intact. Psychiatric: Judgment and insight are intact. Affect is appropriate.  ECOG = 1  LABORATORY DATA:  Lab Results  Component Value Date   WBC 7.5 10/05/2015   HGB 12.5 (L) 10/05/2015   HCT 37.5 (L) 10/05/2015   MCV 91.7 10/05/2015   PLT 292 10/05/2015   NEUTROABS 4.7 10/05/2015   Lab Results  Component Value Date   NA 140 10/05/2015   K 3.9 10/05/2015   CL 99 10/21/2014   CO2 28 10/05/2015   GLUCOSE 256 (H) 10/05/2015   CREATININE 0.8 10/05/2015   CALCIUM 9.1 10/05/2015      RADIOGRAPHY: Nm Pet Image Initial (pi) Skull Base To Thigh  Result Date: 10/03/2015 CLINICAL DATA:  Initial treatment strategy for newly diagnosed lung adenocarcinoma of the right upper lobe. EXAM: NUCLEAR MEDICINE PET SKULL BASE TO THIGH TECHNIQUE: 6.7 mCi F-18 FDG was injected intravenously. Full-ring PET imaging was performed from the skull base to thigh after the radiotracer. CT data was obtained and used for attenuation correction and anatomic localization. FASTING BLOOD GLUCOSE:  Value: 159 mg/dl COMPARISON:  09/01/2015 chest CT.  09/21/2004 CT abdomen/ pelvis. FINDINGS: NECK No hypermetabolic lymph nodes in the neck. Right carotid artery stent is noted. CHEST Hypermetabolic irregular 2.3 x 1.3 cm posterior right upper lobe pulmonary nodule with max SUV 8.7 (series 8/image 27). No acute consolidative airspace disease or additional significant pulmonary nodules. Mildly enlarged hypermetabolic 1.1 cm right hilar node with max SUV 4.1 (series 4/image 73). There are a few clustered nonenlarged hypermetabolic  right axillary nodes, for example a 0.7 cm right axillary node with max SUV 4.3 (series 4/image 53). No hypermetabolic mediastinal, left hilar or left axillary nodes. Left anterior descending, left circumflex and right coronary atherosclerosis. Atherosclerotic nonaneurysmal thoracic aorta. No pleural effusions. ABDOMEN/PELVIS No abnormal hypermetabolic activity within the liver, pancreas, adrenal glands, or spleen. No hypermetabolic lymph nodes in the abdomen or pelvis. Mildly enlarged prostate with coarse nonspecific internal prostatic calcifications. Atherosclerotic nonaneurysmal abdominal aorta. SKELETON No focal hypermetabolic activity to suggest skeletal metastasis. IMPRESSION: 1. Hypermetabolic irregular 2.3 cm posterior right upper lobe pulmonary nodule, consistent with primary bronchogenic carcinoma. 2. Hypermetabolic right hilar nodal metastasis. 3. Clustered non-enlarged hypermetabolic right axillary lymph nodes, nonspecific, favor benign injection related uptake (radiotracer was injected into a right antecubital vein). Consider targeted ultrasound evaluation of the right axillary nodes. 4. No extrathoracic hypermetabolic metastatic disease. 5. Additional findings include aortic atherosclerosis, three-vessel coronary atherosclerosis and mildly enlarged prostate. Electronically Signed   By: Ilona Sorrel M.D.   On: 10/03/2015 10:27   Dg Chest Port 1 View  Result Date: 09/21/2015 CLINICAL DATA:  Status post bronchoscopy with right upper lobe biopsy EXAM: PORTABLE CHEST 1 VIEW COMPARISON:  Chest radiograph August 25, 2015 and chest CT September 01, 2015 FINDINGS: No pneumothorax. The nodular opacity in the right upper lobe is again noted without appreciable change in size and contour. No new opacity evident. Heart size and pulmonary vascularity are normal. No adenopathy. There is focal calcification in the aortic arch region. IMPRESSION: No pneumothorax. Persistent nodular opacity right upper lobe. No new  opacity. Stable cardiac silhouette. There is aortic atherosclerosis. Electronically Signed   By: Lowella Grip III M.D.   On: 09/21/2015 08:39   Dg C-arm Bronchoscopy  Result Date: 09/21/2015 CLINICAL DATA:  C-ARM BRONCHOSCOPY Fluoroscopy was utilized by the requesting physician.  No radiographic interpretation.      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 We discussed pre-op chemoradiation in the management of his disease. We discussed 5 weeks of treatment as an outpatient. We discussed the process of CT simulation and the placement of tattoos. We discussed dysphagia, weight loss and fatigue as the acute side effects of radiation. We discussed damage to critical normal structures in the chest as well as the spinal cord as possible but improbably.  PLAN: The patient signed a consent form and this was placed in his medical chart. CT simulation is scheduled 10/10/15 at 10AM. The patient will be scheduled for a brain MRI in the future for staging purposes.   ------------------------------------------------  Blair Promise, PhD, MD  This document serves as a record of services personally performed by Gery Pray, MD. It was created on his behalf by Darcus Austin, a trained medical scribe. The creation of this record is based on the scribe's personal observations and the provider's statements to them. This document has been checked and approved by the attending provider.  ADDENDUM: After seeing the patient and reviewing his  images again, Dr. Roxan Hockey determined the patient would be a good candidate for surgery without pre-operative treatment. Therefore, we will cancel his CT simulation.

## 2015-10-06 ENCOUNTER — Telehealth: Payer: Self-pay | Admitting: *Deleted

## 2015-10-06 NOTE — Progress Notes (Signed)
DISCONTINUE ON PATHWAY REGIMEN - Non-Small Cell Lung  ERX540: Carboplatin AUC=2 + Paclitaxel 45 mg/m2 Weekly During Radiation   Administer weekly:     Paclitaxel (Taxol(R)) 45 mg/m2 in 250 mL NS IV over 1 hour followed by Dose Mod: None     Carboplatin (Paraplatin(R)) AUC=2 in 100 mL NS IV over 30 minutes Dose Mod: None Additional Orders: * All AUC calculations intended to be used in [employer] formula  **Always confirm dose/schedule in your pharmacy ordering system**    REASON: Other Reason PRIOR TREATMENT: [id number]: Carboplatin AUC=2 + Paclitaxel 45 mg/m2 Weekly During Radiation TREATMENT RESPONSE: Unable to Evaluate  Non-Small Cell Lung - No Medical Intervention - Off Treatment.  Patient Characteristics: Stage II - Unresectable AJCC M Stage: 0 AJCC N Stage: 1 AJCC T Stage: 1a Current Disease Status: No Distant Metastases or Local Recurrence AJCC Stage Grouping: IIA

## 2015-10-06 NOTE — Telephone Encounter (Signed)
Oncology Nurse Navigator Documentation  Oncology Nurse Navigator Flowsheets 10/06/2015  Navigator Encounter Type Telephone/I followed up with precert regarding Brett [name] MRI Brain.  It was pre aurthorized.  I called central scheduling.  I then called Brett [name] to give her the appt and location of MRI Brain.  She verbalized understanding of appt time and place.   Telephone Outgoing Call  Treatment Phase Pre-Tx/Tx Discussion  Barriers/Navigation Needs Coordination of Care  Interventions Coordination of Care  Coordination of Care Radiology  Acuity Level 2  Acuity Level 2 Assistance expediting appointments  Time Spent with Patient 30

## 2015-10-10 ENCOUNTER — Other Ambulatory Visit: Payer: Self-pay | Admitting: *Deleted

## 2015-10-10 ENCOUNTER — Ambulatory Visit
Admission: RE | Admit: 2015-10-10 | Discharge: 2015-10-10 | Disposition: A | Discharge status: Home / Self Care | Payer: Medicare Other | Source: Ambulatory Visit | Attending: Radiation Oncology | Admitting: Radiation Oncology

## 2015-10-10 DIAGNOSIS — C3411 Malignant neoplasm of upper lobe, right bronchus or lung: Secondary | ICD-10-CM

## 2015-10-13 ENCOUNTER — Ambulatory Visit (HOSPITAL_COMMUNITY): Admission: RE | Admit: 2015-10-13 | Payer: Medicare Other | Source: Ambulatory Visit

## 2015-10-16 NOTE — Pre-Procedure Instructions (Signed)
Brett [name]  10/16/2015      [facility] - [place], [address] [place] AT [facility] [address] [place] [address] Phone: [phone] Fax: [phone]    Your procedure is scheduled on Wednesday September 27.  Report to [facility] Admitting at 11:30 A.M.  Call this number if you have problems the morning of surgery:  [phone]   Remember:  Do not eat food or drink liquids after midnight.  Take these medicines the morning of surgery with A SIP OF WATER: hydrocodone (Norco) if needed, pantoprazole (protonix), pregabalin (Lyrica)  7 days prior to surgery STOP taking any Aspirin, Aleve, Naproxen, Ibuprofen, Motrin, Advil, Goody's, BC's, all herbal medications, fish oil, and all vitamins  WHAT DO I DO ABOUT MY DIABETES MEDICATION?   Marland Kitchen Do not take oral diabetes medicines (pills) the morning of surgery. DO NOT TAKE metformin (Glucophage), sitagliptin (Januvia), or glipizide (Glucotrol) the day of surgery.   . The day before surgery: Only take morning and/or lunch dose of Glipizide (Glucotrol)    How to Manage Your Diabetes Before and After Surgery  Why is it important to control my blood sugar before and after surgery? . Improving blood sugar levels before and after surgery helps healing and can limit problems. . A way of improving blood sugar control is eating a healthy diet by: o  Eating less sugar and carbohydrates o  Increasing activity/exercise o  Talking with your doctor about reaching your blood sugar goals . High blood sugars (greater than 180 mg/dL) can raise your risk of infections and slow your recovery, so you will need to focus on controlling your diabetes during the weeks before surgery. . Make sure that the doctor who takes care of your diabetes knows about your planned surgery including the date and location.  How do I manage my blood sugar before surgery? . Check your blood sugar at least 4  times a day, starting 2 days before surgery, to make sure that the level is not too high or low. o Check your blood sugar the morning of your surgery when you wake up and every 2 hours until you get to the Short Stay unit. . If your blood sugar is less than 70 mg/dL, you will need to treat for low blood sugar: o Do not take insulin. o Treat a low blood sugar (less than 70 mg/dL) with  cup of clear juice (cranberry or apple), 4 glucose tablets, OR glucose gel. o Recheck blood sugar in 15 minutes after treatment (to make sure it is greater than 70 mg/dL). If your blood sugar is not greater than 70 mg/dL on recheck, call [phone] for further instructions. . Report your blood sugar to the short stay nurse when you get to Short Stay.  . If you are admitted to the hospital after surgery: o Your blood sugar will be checked by the staff and you will probably be given insulin after surgery (instead of oral diabetes medicines) to make sure you have good blood sugar levels. o The goal for blood sugar control after surgery is 80-180 mg/dL.              Do not wear jewelry  Do not wear lotions, powders, or perfumes, or deoderant.  Men may shave face and neck.  Do not bring valuables to the hospital.  [facility] is not responsible for any belongings or valuables.  Contacts, dentures or bridgework may  not be worn into surgery.  Leave your suitcase in the car.  After surgery it may be brought to your room.  For patients admitted to the hospital, discharge time will be determined by your treatment team.  Patients discharged the day of surgery will not be allowed to drive home.    Special instructions:     [place]- Preparing For Surgery  Before surgery, you can play an important role. Because skin is not sterile, your skin needs to be as free of germs as possible. You can reduce the number of germs on your skin by washing with CHG (chlorahexidine gluconate) Soap before surgery.  CHG  is an antiseptic cleaner which kills germs and bonds with the skin to continue killing germs even after washing.  Please do not use if you have an allergy to CHG or antibacterial soaps. If your skin becomes reddened/irritated stop using the CHG.  Do not shave (including legs and underarms) for at least 48 hours prior to first CHG shower. It is OK to shave your face.  Please follow these instructions carefully.   1. Shower the NIGHT BEFORE SURGERY and the MORNING OF SURGERY with CHG.   2. If you chose to wash your hair, wash your hair first as usual with your normal shampoo.  3. After you shampoo, rinse your hair and body thoroughly to remove the shampoo.  4. Use CHG as you would any other liquid soap. You can apply CHG directly to the skin and wash gently with a scrungie or a clean washcloth.   5. Apply the CHG Soap to your body ONLY FROM THE NECK DOWN.  Do not use on open wounds or open sores. Avoid contact with your eyes, ears, mouth and genitals (private parts). Wash genitals (private parts) with your normal soap.  6. Wash thoroughly, paying special attention to the area where your surgery will be performed.  7. Thoroughly rinse your body with warm water from the neck down.  8. DO NOT shower/wash with your normal soap after using and rinsing off the CHG Soap.  9. Pat yourself dry with a CLEAN TOWEL.   10. Wear CLEAN PAJAMAS   11. Place CLEAN SHEETS on your bed the night of your first shower and DO NOT SLEEP WITH PETS.    Day of Surgery: Do not apply any deodorants/lotions. Please wear clean clothes to the hospital/surgery center.      Please read over the following fact sheets that you were given. MRSA Information

## 2015-10-17 ENCOUNTER — Encounter (HOSPITAL_COMMUNITY)
Admission: RE | Admit: 2015-10-17 | Discharge: 2015-10-17 | Disposition: A | Discharge status: Home / Self Care | Payer: Medicare Other | Source: Ambulatory Visit | Attending: Thoracic Surgery (Cardiothoracic Vascular Surgery) | Admitting: Thoracic Surgery (Cardiothoracic Vascular Surgery)

## 2015-10-17 ENCOUNTER — Other Ambulatory Visit: Payer: Self-pay | Admitting: *Deleted

## 2015-10-17 ENCOUNTER — Encounter (HOSPITAL_COMMUNITY): Payer: Self-pay

## 2015-10-17 ENCOUNTER — Other Ambulatory Visit: Payer: Self-pay

## 2015-10-17 ENCOUNTER — Ambulatory Visit (HOSPITAL_COMMUNITY)
Admission: RE | Admit: 2015-10-17 | Discharge: 2015-10-17 | Disposition: A | Discharge status: Home / Self Care | Payer: Medicare Other | Source: Ambulatory Visit | Attending: Internal Medicine | Admitting: Internal Medicine

## 2015-10-17 ENCOUNTER — Ambulatory Visit (HOSPITAL_COMMUNITY)
Admission: RE | Admit: 2015-10-17 | Discharge: 2015-10-17 | Disposition: A | Discharge status: Home / Self Care | Payer: Medicare Other | Source: Ambulatory Visit | Attending: Thoracic Surgery (Cardiothoracic Vascular Surgery) | Admitting: Thoracic Surgery (Cardiothoracic Vascular Surgery)

## 2015-10-17 DIAGNOSIS — M797 Fibromyalgia: Secondary | ICD-10-CM | POA: Diagnosis not present

## 2015-10-17 DIAGNOSIS — Z7984 Long term (current) use of oral hypoglycemic drugs: Secondary | ICD-10-CM | POA: Diagnosis not present

## 2015-10-17 DIAGNOSIS — Z7902 Long term (current) use of antithrombotics/antiplatelets: Secondary | ICD-10-CM | POA: Diagnosis not present

## 2015-10-17 DIAGNOSIS — J9382 Other air leak: Secondary | ICD-10-CM | POA: Diagnosis not present

## 2015-10-17 DIAGNOSIS — E1165 Type 2 diabetes mellitus with hyperglycemia: Secondary | ICD-10-CM | POA: Diagnosis not present

## 2015-10-17 DIAGNOSIS — K2270 Barrett's esophagus without dysplasia: Secondary | ICD-10-CM | POA: Diagnosis not present

## 2015-10-17 DIAGNOSIS — G894 Chronic pain syndrome: Secondary | ICD-10-CM | POA: Diagnosis not present

## 2015-10-17 DIAGNOSIS — Z7982 Long term (current) use of aspirin: Secondary | ICD-10-CM | POA: Diagnosis not present

## 2015-10-17 DIAGNOSIS — Z01818 Encounter for other preprocedural examination: Secondary | ICD-10-CM

## 2015-10-17 DIAGNOSIS — R918 Other nonspecific abnormal finding of lung field: Secondary | ICD-10-CM | POA: Diagnosis not present

## 2015-10-17 DIAGNOSIS — E785 Hyperlipidemia, unspecified: Secondary | ICD-10-CM | POA: Diagnosis not present

## 2015-10-17 DIAGNOSIS — C3490 Malignant neoplasm of unspecified part of unspecified bronchus or lung: Secondary | ICD-10-CM | POA: Diagnosis not present

## 2015-10-17 DIAGNOSIS — Z79899 Other long term (current) drug therapy: Secondary | ICD-10-CM | POA: Diagnosis not present

## 2015-10-17 DIAGNOSIS — C3411 Malignant neoplasm of upper lobe, right bronchus or lung: Secondary | ICD-10-CM

## 2015-10-17 DIAGNOSIS — J939 Pneumothorax, unspecified: Secondary | ICD-10-CM | POA: Diagnosis not present

## 2015-10-17 DIAGNOSIS — J449 Chronic obstructive pulmonary disease, unspecified: Secondary | ICD-10-CM | POA: Diagnosis not present

## 2015-10-17 DIAGNOSIS — C3491 Malignant neoplasm of unspecified part of right bronchus or lung: Secondary | ICD-10-CM

## 2015-10-17 DIAGNOSIS — E1151 Type 2 diabetes mellitus with diabetic peripheral angiopathy without gangrene: Secondary | ICD-10-CM | POA: Diagnosis not present

## 2015-10-17 HISTORY — DX: Other amnesia: R41.3

## 2015-10-17 LAB — BLOOD GAS, ARTERIAL
Acid-Base Excess: 0.1 mmol/L (ref 0.0–2.0)
Bicarbonate: 24.2 mmol/L (ref 20.0–28.0)
Drawn by: 206361
FIO2: 21
O2 Saturation: 97.8 %
Patient temperature: 98.6
pCO2 arterial: 39.1 mmHg (ref 32.0–48.0)
pH, Arterial: 7.408 (ref 7.350–7.450)
pO2, Arterial: 99.4 mmHg (ref 83.0–108.0)

## 2015-10-17 LAB — URINALYSIS, ROUTINE W REFLEX MICROSCOPIC
Bilirubin Urine: NEGATIVE
Glucose, UA: >1000 mg/dL — AB
Hgb urine dipstick: NEGATIVE
Ketones, ur: NEGATIVE mg/dL
Leukocytes, UA: NEGATIVE
Nitrite: NEGATIVE
Protein, ur: NEGATIVE mg/dL
Specific Gravity, Urine: 1.014 (ref 1.005–1.030)
pH: 5.5 (ref 5.0–8.0)

## 2015-10-17 LAB — COMPREHENSIVE METABOLIC PANEL
ALT: 14 U/L — ABNORMAL LOW (ref 17–63)
AST: 13 U/L — ABNORMAL LOW (ref 15–41)
Albumin: 3.7 g/dL (ref 3.5–5.0)
Alkaline Phosphatase: 96 U/L (ref 38–126)
Anion gap: 7 (ref 5–15)
BUN: 8 mg/dL (ref 6–20)
CO2: 22 mmol/L (ref 22–32)
Calcium: 9.3 mg/dL (ref 8.9–10.3)
Chloride: 106 mmol/L (ref 101–111)
Creatinine, Ser: 0.62 mg/dL (ref 0.61–1.24)
GFR calc Af Amer: >60 mL/min (ref >60)
GFR calc non Af Amer: >60 mL/min (ref >60)
Glucose, Bld: 224 mg/dL — ABNORMAL HIGH (ref 65–99)
Potassium: 4 mmol/L (ref 3.5–5.1)
Sodium: 135 mmol/L (ref 135–145)
Total Bilirubin: 0.3 mg/dL (ref 0.3–1.2)
Total Protein: 7 g/dL (ref 6.5–8.1)

## 2015-10-17 LAB — TYPE AND SCREEN
ABO/RH(D): A POS
Antibody Screen: NEGATIVE

## 2015-10-17 LAB — PROTIME-INR
INR: 0.95
Prothrombin Time: 12.6 seconds (ref 11.4–15.2)

## 2015-10-17 LAB — APTT: aPTT: 29 seconds (ref 24–36)

## 2015-10-17 LAB — URINE MICROSCOPIC-ADD ON: RBC / HPF: NONE SEEN RBC/hpf (ref 0–5)

## 2015-10-17 LAB — CBC
HCT: 36.6 % — ABNORMAL LOW (ref 39.0–52.0)
Hemoglobin: 12 g/dL — ABNORMAL LOW (ref 13.0–17.0)
MCH: 30 pg (ref 26.0–34.0)
MCHC: 32.8 g/dL (ref 30.0–36.0)
MCV: 91.5 fL (ref 78.0–100.0)
Platelets: 329 10*3/uL (ref 150–400)
RBC: 4 MIL/uL — ABNORMAL LOW (ref 4.22–5.81)
RDW: 13 % (ref 11.5–15.5)
WBC: 8.3 10*3/uL (ref 4.0–10.5)

## 2015-10-17 LAB — GLUCOSE, CAPILLARY: Glucose-Capillary: 282 mg/dL — ABNORMAL HIGH (ref 65–99)

## 2015-10-17 LAB — ABO/RH: ABO/RH(D): A POS

## 2015-10-17 LAB — SURGICAL PCR SCREEN
MRSA, PCR: NEGATIVE
Staphylococcus aureus: POSITIVE — AB

## 2015-10-17 MED ORDER — GADOBENATE DIMEGLUMINE 529 MG/ML IV SOLN
10.0000 mL | Freq: Once | INTRAVENOUS | Status: AC | PRN
  Administered 2015-10-17: 10 mL via INTRAVENOUS

## 2015-10-17 NOTE — Progress Notes (Addendum)
PCP: [name] No cardiologist or cardiac workup per pt.   EKG: 10/17/15 CXR: 10/17/15  No complaints of chest pain, shortness of breath or cough. Pt with some memory issues per wife, pt forgettful due to head injury.   Pt has not been able to check blood sugars due to broken CBG machine. Last A1c 7.9, CBG today 282Wife to get new machine tonight if possible.   Pt last dose of plavix and aspirin on Friday 10/13/15.

## 2015-10-17 NOTE — Telephone Encounter (Signed)
Call from pt's wife-states pt is scheduled to have surgery tomorrow, but during his preop visit today his glucose was 282.  The office has asked that pt checked is blood glucose a couple more times tonight, but pt's lancing device is broken. New rx for lancing device and lancets called into pharmacy.Despina Hidden Cassady9/26/20173:47 PM

## 2015-10-17 NOTE — Progress Notes (Signed)
Patient called and notified of his results of the PCR, prescription called in at [facility] [phone]

## 2015-10-18 ENCOUNTER — Inpatient Hospital Stay (HOSPITAL_COMMUNITY): Payer: Medicare Other | Admitting: Emergency Medicine

## 2015-10-18 ENCOUNTER — Encounter (HOSPITAL_COMMUNITY): Payer: Self-pay | Admitting: Surgery

## 2015-10-18 ENCOUNTER — Inpatient Hospital Stay (HOSPITAL_COMMUNITY)
Admission: RE | Admit: 2015-10-18 | Discharge: 2015-10-31 | DRG: 164 | Disposition: A | Discharge status: Home / Self Care | Payer: Medicare Other | Source: Ambulatory Visit | Attending: Thoracic Surgery (Cardiothoracic Vascular Surgery) | Admitting: Thoracic Surgery (Cardiothoracic Vascular Surgery)

## 2015-10-18 ENCOUNTER — Inpatient Hospital Stay (HOSPITAL_COMMUNITY): Payer: Medicare Other | Admitting: Anesthesiology

## 2015-10-18 ENCOUNTER — Inpatient Hospital Stay (HOSPITAL_COMMUNITY): Payer: Medicare Other

## 2015-10-18 ENCOUNTER — Encounter (HOSPITAL_COMMUNITY)
Admission: RE | Disposition: A | Discharge status: Home / Self Care | Payer: Self-pay | Source: Ambulatory Visit | Attending: Thoracic Surgery (Cardiothoracic Vascular Surgery)

## 2015-10-18 DIAGNOSIS — J449 Chronic obstructive pulmonary disease, unspecified: Secondary | ICD-10-CM | POA: Diagnosis not present

## 2015-10-18 DIAGNOSIS — Z7982 Long term (current) use of aspirin: Secondary | ICD-10-CM

## 2015-10-18 DIAGNOSIS — J939 Pneumothorax, unspecified: Secondary | ICD-10-CM | POA: Diagnosis not present

## 2015-10-18 DIAGNOSIS — J9382 Other air leak: Secondary | ICD-10-CM | POA: Diagnosis not present

## 2015-10-18 DIAGNOSIS — R918 Other nonspecific abnormal finding of lung field: Secondary | ICD-10-CM | POA: Diagnosis not present

## 2015-10-18 DIAGNOSIS — F17210 Nicotine dependence, cigarettes, uncomplicated: Secondary | ICD-10-CM | POA: Diagnosis present

## 2015-10-18 DIAGNOSIS — Z79899 Other long term (current) drug therapy: Secondary | ICD-10-CM

## 2015-10-18 DIAGNOSIS — E1151 Type 2 diabetes mellitus with diabetic peripheral angiopathy without gangrene: Secondary | ICD-10-CM | POA: Diagnosis not present

## 2015-10-18 DIAGNOSIS — E785 Hyperlipidemia, unspecified: Secondary | ICD-10-CM | POA: Diagnosis present

## 2015-10-18 DIAGNOSIS — Z9689 Presence of other specified functional implants: Secondary | ICD-10-CM

## 2015-10-18 DIAGNOSIS — R0600 Dyspnea, unspecified: Secondary | ICD-10-CM

## 2015-10-18 DIAGNOSIS — M797 Fibromyalgia: Secondary | ICD-10-CM | POA: Diagnosis present

## 2015-10-18 DIAGNOSIS — Z09 Encounter for follow-up examination after completed treatment for conditions other than malignant neoplasm: Secondary | ICD-10-CM

## 2015-10-18 DIAGNOSIS — K2270 Barrett's esophagus without dysplasia: Secondary | ICD-10-CM | POA: Diagnosis present

## 2015-10-18 DIAGNOSIS — Z4682 Encounter for fitting and adjustment of non-vascular catheter: Secondary | ICD-10-CM

## 2015-10-18 DIAGNOSIS — Z7902 Long term (current) use of antithrombotics/antiplatelets: Secondary | ICD-10-CM | POA: Diagnosis not present

## 2015-10-18 DIAGNOSIS — E1165 Type 2 diabetes mellitus with hyperglycemia: Secondary | ICD-10-CM | POA: Diagnosis present

## 2015-10-18 DIAGNOSIS — F329 Major depressive disorder, single episode, unspecified: Secondary | ICD-10-CM | POA: Diagnosis present

## 2015-10-18 DIAGNOSIS — F4310 Post-traumatic stress disorder, unspecified: Secondary | ICD-10-CM | POA: Diagnosis present

## 2015-10-18 DIAGNOSIS — Z7984 Long term (current) use of oral hypoglycemic drugs: Secondary | ICD-10-CM

## 2015-10-18 DIAGNOSIS — Z902 Acquired absence of lung [part of]: Secondary | ICD-10-CM

## 2015-10-18 DIAGNOSIS — J948 Other specified pleural conditions: Secondary | ICD-10-CM | POA: Diagnosis not present

## 2015-10-18 DIAGNOSIS — I2510 Atherosclerotic heart disease of native coronary artery without angina pectoris: Secondary | ICD-10-CM | POA: Diagnosis not present

## 2015-10-18 DIAGNOSIS — J9811 Atelectasis: Secondary | ICD-10-CM | POA: Diagnosis not present

## 2015-10-18 DIAGNOSIS — G894 Chronic pain syndrome: Secondary | ICD-10-CM | POA: Diagnosis present

## 2015-10-18 DIAGNOSIS — C3411 Malignant neoplasm of upper lobe, right bronchus or lung: Secondary | ICD-10-CM

## 2015-10-18 HISTORY — PX: VIDEO ASSISTED THORACOSCOPY (VATS)/ LOBECTOMY: SHX6169

## 2015-10-18 LAB — GLUCOSE, CAPILLARY
Glucose-Capillary: 109 mg/dL — ABNORMAL HIGH (ref 65–99)
Glucose-Capillary: 132 mg/dL — ABNORMAL HIGH (ref 65–99)
Glucose-Capillary: 207 mg/dL — ABNORMAL HIGH (ref 65–99)
Glucose-Capillary: 255 mg/dL — ABNORMAL HIGH (ref 65–99)

## 2015-10-18 SURGERY — VIDEO ASSISTED THORACOSCOPY (VATS)/ LOBECTOMY
Anesthesia: General | Site: Chest | Laterality: Right

## 2015-10-18 MED ORDER — PROPOFOL 10 MG/ML IV BOLUS
INTRAVENOUS | Status: AC
  Filled 2015-10-18: qty 40

## 2015-10-18 MED ORDER — CEFUROXIME SODIUM 1.5 G IJ SOLR
INTRAMUSCULAR | Status: AC
  Filled 2015-10-18: qty 1.5

## 2015-10-18 MED ORDER — FENTANYL 40 MCG/ML IV SOLN
INTRAVENOUS | Status: DC
  Administered 2015-10-18: 15 ug via INTRAVENOUS
  Administered 2015-10-18: 90 ug via INTRAVENOUS
  Administered 2015-10-19: 75 ug via INTRAVENOUS
  Administered 2015-10-19: 30 ug via INTRAVENOUS
  Administered 2015-10-19: 150 ug via INTRAVENOUS
  Administered 2015-10-19: 105 ug via INTRAVENOUS
  Administered 2015-10-19: 150 ug via INTRAVENOUS
  Administered 2015-10-19: 30 ug via INTRAVENOUS
  Administered 2015-10-20: 95 ug via INTRAVENOUS
  Administered 2015-10-20 (×2): 30 ug via INTRAVENOUS
  Administered 2015-10-20: 45 ug via INTRAVENOUS
  Administered 2015-10-20: 10 ug via INTRAVENOUS
  Administered 2015-10-20: 15 ug via INTRAVENOUS
  Administered 2015-10-20: 40 ug via INTRAVENOUS
  Administered 2015-10-21: 75 ug via INTRAVENOUS
  Administered 2015-10-21: 15 ug via INTRAVENOUS
  Administered 2015-10-21: 30 ug via INTRAVENOUS
  Administered 2015-10-21: 60 ug via INTRAVENOUS
  Administered 2015-10-21: 75 ug via INTRAVENOUS
  Administered 2015-10-22: 60 ug via INTRAVENOUS
  Administered 2015-10-22: 30 ug via INTRAVENOUS
  Administered 2015-10-22: 90 ug via INTRAVENOUS
  Administered 2015-10-22 – 2015-10-23 (×2): 30 ug via INTRAVENOUS
  Administered 2015-10-23: 0 via INTRAVENOUS
  Administered 2015-10-23 (×3): 30 ug via INTRAVENOUS
  Administered 2015-10-24: 0 ug via INTRAVENOUS
  Filled 2015-10-18: qty 25

## 2015-10-18 MED ORDER — FENTANYL CITRATE (PF) 100 MCG/2ML IJ SOLN
INTRAMUSCULAR | Status: DC | PRN
  Administered 2015-10-18 (×6): 50 ug via INTRAVENOUS
  Administered 2015-10-18: 100 ug via INTRAVENOUS
  Administered 2015-10-18 (×2): 50 ug via INTRAVENOUS

## 2015-10-18 MED ORDER — FENTANYL CITRATE (PF) 250 MCG/5ML IJ SOLN
INTRAMUSCULAR | Status: AC
  Filled 2015-10-18: qty 5

## 2015-10-18 MED ORDER — NEOSTIGMINE METHYLSULFATE 5 MG/5ML IV SOSY
PREFILLED_SYRINGE | INTRAVENOUS | Status: AC
  Filled 2015-10-18: qty 5

## 2015-10-18 MED ORDER — BUPIVACAINE 0.5 % ON-Q PUMP SINGLE CATH 400 ML
INJECTION | Status: DC | PRN
  Administered 2015-10-18: 400 mL

## 2015-10-18 MED ORDER — LIDOCAINE 2% (20 MG/ML) 5 ML SYRINGE
INTRAMUSCULAR | Status: AC
  Filled 2015-10-18: qty 5

## 2015-10-18 MED ORDER — BISACODYL 5 MG PO TBEC
10.0000 mg | DELAYED_RELEASE_TABLET | Freq: Every day | ORAL | Status: DC
  Administered 2015-10-18 – 2015-10-25 (×7): 10 mg via ORAL
  Filled 2015-10-18 (×12): qty 2

## 2015-10-18 MED ORDER — PHENYLEPHRINE HCL 10 MG/ML IJ SOLN
INTRAVENOUS | Status: DC | PRN
  Administered 2015-10-18: 20 ug/min via INTRAVENOUS

## 2015-10-18 MED ORDER — DEXAMETHASONE SODIUM PHOSPHATE 10 MG/ML IJ SOLN
INTRAMUSCULAR | Status: AC
  Filled 2015-10-18: qty 1

## 2015-10-18 MED ORDER — SODIUM CHLORIDE 0.9% FLUSH: 9.0000 mL | INTRAVENOUS | Status: DC | PRN

## 2015-10-18 MED ORDER — ONDANSETRON HCL 4 MG/2ML IJ SOLN: 4.0000 mg | Freq: Four times a day (QID) | INTRAMUSCULAR | Status: DC | PRN

## 2015-10-18 MED ORDER — INSULIN ASPART 100 UNIT/ML ~~LOC~~ SOLN
0.0000 [IU] | SUBCUTANEOUS | Status: DC
Start: 2015-10-18 — End: 2015-10-19
  Administered 2015-10-18: 8 [IU] via SUBCUTANEOUS
  Administered 2015-10-19: 4 [IU] via SUBCUTANEOUS
  Administered 2015-10-19: 12 [IU] via SUBCUTANEOUS

## 2015-10-18 MED ORDER — KETOROLAC TROMETHAMINE 30 MG/ML IJ SOLN
30.0000 mg | Freq: Four times a day (QID) | INTRAMUSCULAR | Status: AC | PRN
  Administered 2015-10-18 – 2015-10-19 (×3): 30 mg via INTRAVENOUS
  Filled 2015-10-18 (×2): qty 1

## 2015-10-18 MED ORDER — NEOSTIGMINE METHYLSULFATE 10 MG/10ML IV SOLN
INTRAVENOUS | Status: DC | PRN
  Administered 2015-10-18: 3 mg via INTRAVENOUS

## 2015-10-18 MED ORDER — DEXTROSE 5 % IV SOLN
1.5000 g | Freq: Two times a day (BID) | INTRAVENOUS | Status: AC
  Administered 2015-10-19 (×2): 1.5 g via INTRAVENOUS
  Filled 2015-10-18 (×2): qty 1.5

## 2015-10-18 MED ORDER — HYDROMORPHONE HCL 1 MG/ML IJ SOLN
INTRAMUSCULAR | Status: AC
  Filled 2015-10-18: qty 1

## 2015-10-18 MED ORDER — HYDROCODONE-ACETAMINOPHEN 10-325 MG PO TABS
1.0000 | ORAL_TABLET | Freq: Four times a day (QID) | ORAL | Status: DC | PRN
  Administered 2015-10-19 – 2015-10-29 (×14): 1 via ORAL
  Filled 2015-10-18 (×14): qty 1

## 2015-10-18 MED ORDER — POTASSIUM CHLORIDE 10 MEQ/50ML IV SOLN: 10.0000 meq | Freq: Every day | INTRAVENOUS | Status: DC | PRN

## 2015-10-18 MED ORDER — ONDANSETRON HCL 4 MG/2ML IJ SOLN
INTRAMUSCULAR | Status: AC
  Filled 2015-10-18: qty 2

## 2015-10-18 MED ORDER — GLYCOPYRROLATE 0.2 MG/ML IV SOSY
PREFILLED_SYRINGE | INTRAVENOUS | Status: AC
  Filled 2015-10-18: qty 3

## 2015-10-18 MED ORDER — HYDROMORPHONE HCL 1 MG/ML IJ SOLN
0.2500 mg | INTRAMUSCULAR | Status: DC | PRN
  Administered 2015-10-18 (×2): 0.5 mg via INTRAVENOUS

## 2015-10-18 MED ORDER — BUPIVACAINE HCL (PF) 0.5 % IJ SOLN
INTRAMUSCULAR | Status: AC
  Filled 2015-10-18: qty 30

## 2015-10-18 MED ORDER — ONDANSETRON HCL 4 MG/2ML IJ SOLN
4.0000 mg | Freq: Four times a day (QID) | INTRAMUSCULAR | Status: DC | PRN
  Filled 2015-10-18: qty 2

## 2015-10-18 MED ORDER — BUPIVACAINE ON-Q PAIN PUMP (FOR ORDER SET NO CHG)
INJECTION | Status: AC
  Filled 2015-10-18: qty 1

## 2015-10-18 MED ORDER — DIPHENHYDRAMINE HCL 50 MG/ML IJ SOLN
12.5000 mg | Freq: Four times a day (QID) | INTRAMUSCULAR | Status: DC | PRN
  Filled 2015-10-18: qty 0.25

## 2015-10-18 MED ORDER — GLYCOPYRROLATE 0.2 MG/ML IJ SOLN
INTRAMUSCULAR | Status: DC | PRN
  Administered 2015-10-18: 0.4 mg via INTRAVENOUS

## 2015-10-18 MED ORDER — LACTATED RINGERS IV SOLN
INTRAVENOUS | Status: DC
  Administered 2015-10-18 (×3): via INTRAVENOUS

## 2015-10-18 MED ORDER — ACETAMINOPHEN 500 MG PO TABS
1000.0000 mg | ORAL_TABLET | Freq: Four times a day (QID) | ORAL | Status: AC
  Administered 2015-10-18 – 2015-10-23 (×15): 1000 mg via ORAL
  Filled 2015-10-18 (×17): qty 2

## 2015-10-18 MED ORDER — PHENYLEPHRINE HCL 10 MG/ML IJ SOLN
INTRAMUSCULAR | Status: DC | PRN
  Administered 2015-10-18: 80 ug via INTRAVENOUS
  Administered 2015-10-18: 120 ug via INTRAVENOUS

## 2015-10-18 MED ORDER — DEXAMETHASONE SODIUM PHOSPHATE 4 MG/ML IJ SOLN
INTRAMUSCULAR | Status: DC | PRN
  Administered 2015-10-18: 4 mg via INTRAVENOUS

## 2015-10-18 MED ORDER — PROMETHAZINE HCL 25 MG/ML IJ SOLN: 6.2500 mg | INTRAMUSCULAR | Status: DC | PRN

## 2015-10-18 MED ORDER — KETOROLAC TROMETHAMINE 30 MG/ML IJ SOLN
INTRAMUSCULAR | Status: AC
  Filled 2015-10-18: qty 1

## 2015-10-18 MED ORDER — LEVALBUTEROL HCL 0.63 MG/3ML IN NEBU
0.6300 mg | INHALATION_SOLUTION | Freq: Four times a day (QID) | RESPIRATORY_TRACT | Status: DC
  Administered 2015-10-18 – 2015-10-20 (×7): 0.63 mg via RESPIRATORY_TRACT
  Filled 2015-10-18 (×7): qty 3

## 2015-10-18 MED ORDER — SENNOSIDES-DOCUSATE SODIUM 8.6-50 MG PO TABS
1.0000 | ORAL_TABLET | Freq: Every day | ORAL | Status: DC
  Administered 2015-10-18 – 2015-10-26 (×5): 1 via ORAL
  Filled 2015-10-18 (×10): qty 1

## 2015-10-18 MED ORDER — ROCURONIUM BROMIDE 10 MG/ML (PF) SYRINGE
PREFILLED_SYRINGE | INTRAVENOUS | Status: AC
  Filled 2015-10-18: qty 10

## 2015-10-18 MED ORDER — METOCLOPRAMIDE HCL 5 MG/ML IJ SOLN
10.0000 mg | Freq: Four times a day (QID) | INTRAMUSCULAR | Status: AC
  Administered 2015-10-18 – 2015-10-23 (×20): 10 mg via INTRAVENOUS
  Filled 2015-10-18 (×19): qty 2

## 2015-10-18 MED ORDER — FENTANYL 40 MCG/ML IV SOLN
INTRAVENOUS | Status: AC
  Filled 2015-10-18: qty 25

## 2015-10-18 MED ORDER — PROPOFOL 10 MG/ML IV BOLUS
INTRAVENOUS | Status: DC | PRN
  Administered 2015-10-18: 160 mg via INTRAVENOUS

## 2015-10-18 MED ORDER — DIPHENHYDRAMINE HCL 12.5 MG/5ML PO ELIX
12.5000 mg | ORAL_SOLUTION | Freq: Four times a day (QID) | ORAL | Status: DC | PRN
  Filled 2015-10-18: qty 5

## 2015-10-18 MED ORDER — ARTIFICIAL TEARS OP OINT
TOPICAL_OINTMENT | OPHTHALMIC | Status: AC
  Filled 2015-10-18: qty 3.5

## 2015-10-18 MED ORDER — ATORVASTATIN CALCIUM 40 MG PO TABS
40.0000 mg | ORAL_TABLET | Freq: Every day | ORAL | Status: DC
  Administered 2015-10-18 – 2015-10-30 (×13): 40 mg via ORAL
  Filled 2015-10-18 (×13): qty 1

## 2015-10-18 MED ORDER — LIDOCAINE HCL (CARDIAC) 20 MG/ML IV SOLN
INTRAVENOUS | Status: DC | PRN
  Administered 2015-10-18: 40 mg via INTRAVENOUS

## 2015-10-18 MED ORDER — SODIUM CHLORIDE 0.9 % IV SOLN
INTRAVENOUS | Status: DC
  Administered 2015-10-18: 100 mL/h via INTRAVENOUS
  Administered 2015-10-19: 15:00:00 via INTRAVENOUS

## 2015-10-18 MED ORDER — DEXTROSE 5 % IV SOLN
1.5000 g | INTRAVENOUS | Status: AC
  Administered 2015-10-18: 1.5 g via INTRAVENOUS

## 2015-10-18 MED ORDER — LACTATED RINGERS IV SOLN
INTRAVENOUS | Status: DC | PRN
  Administered 2015-10-18: 13:00:00 via INTRAVENOUS

## 2015-10-18 MED ORDER — ONDANSETRON HCL 4 MG/2ML IJ SOLN
INTRAMUSCULAR | Status: AC
  Filled 2015-10-18: qty 4

## 2015-10-18 MED ORDER — LISINOPRIL 5 MG PO TABS
5.0000 mg | ORAL_TABLET | Freq: Every day | ORAL | Status: DC
  Administered 2015-10-20 – 2015-10-30 (×11): 5 mg via ORAL
  Filled 2015-10-18 (×11): qty 1

## 2015-10-18 MED ORDER — BUPIVACAINE HCL (PF) 0.5 % IJ SOLN
INTRAMUSCULAR | Status: DC | PRN
  Administered 2015-10-18: 5 mL

## 2015-10-18 MED ORDER — MIDAZOLAM HCL 5 MG/5ML IJ SOLN
INTRAMUSCULAR | Status: DC | PRN
  Administered 2015-10-18: 2 mg via INTRAVENOUS

## 2015-10-18 MED ORDER — SUCCINYLCHOLINE CHLORIDE 200 MG/10ML IV SOSY
PREFILLED_SYRINGE | INTRAVENOUS | Status: AC
  Filled 2015-10-18: qty 10

## 2015-10-18 MED ORDER — ASPIRIN EC 81 MG PO TBEC
81.0000 mg | DELAYED_RELEASE_TABLET | Freq: Every day | ORAL | Status: DC
  Administered 2015-10-19 – 2015-10-30 (×12): 81 mg via ORAL
  Filled 2015-10-18 (×12): qty 1

## 2015-10-18 MED ORDER — PREGABALIN 50 MG PO CAPS
50.0000 mg | ORAL_CAPSULE | Freq: Two times a day (BID) | ORAL | Status: DC
  Administered 2015-10-18 – 2015-10-30 (×25): 50 mg via ORAL
  Filled 2015-10-18 (×25): qty 1

## 2015-10-18 MED ORDER — ONDANSETRON HCL 4 MG/2ML IJ SOLN
INTRAMUSCULAR | Status: DC | PRN
  Administered 2015-10-18: 4 mg via INTRAVENOUS

## 2015-10-18 MED ORDER — ACETAMINOPHEN 160 MG/5ML PO SOLN
1000.0000 mg | Freq: Four times a day (QID) | ORAL | Status: AC
  Filled 2015-10-18: qty 40.6

## 2015-10-18 MED ORDER — BUPIVACAINE 0.5 % ON-Q PUMP SINGLE CATH 400 ML
400.0000 mL | INJECTION | Status: DC
  Filled 2015-10-18: qty 400

## 2015-10-18 MED ORDER — ROCURONIUM BROMIDE 100 MG/10ML IV SOLN
INTRAVENOUS | Status: DC | PRN
  Administered 2015-10-18: 50 mg via INTRAVENOUS
  Administered 2015-10-18: 30 mg via INTRAVENOUS

## 2015-10-18 MED ORDER — PROCHLORPERAZINE MALEATE 10 MG PO TABS
10.0000 mg | ORAL_TABLET | Freq: Four times a day (QID) | ORAL | Status: DC | PRN
  Filled 2015-10-18: qty 1

## 2015-10-18 MED ORDER — MIDAZOLAM HCL 2 MG/2ML IJ SOLN
INTRAMUSCULAR | Status: AC
  Filled 2015-10-18: qty 2

## 2015-10-18 MED ORDER — PANTOPRAZOLE SODIUM 40 MG PO TBEC
40.0000 mg | DELAYED_RELEASE_TABLET | Freq: Every day | ORAL | Status: DC
  Administered 2015-10-18 – 2015-10-30 (×13): 40 mg via ORAL
  Filled 2015-10-18 (×13): qty 1

## 2015-10-18 MED ORDER — NALOXONE HCL 0.4 MG/ML IJ SOLN
0.4000 mg | INTRAMUSCULAR | Status: DC | PRN
  Filled 2015-10-18: qty 1

## 2015-10-18 SURGICAL SUPPLY — 97 items
ADH SKN CLS APL DERMABOND .7 (GAUZE/BANDAGES/DRESSINGS) ×1
APPLIER CLIP ROT 10 11.4 M/L (STAPLE)
APR CLP MED LRG 11.4X10 (STAPLE)
BAG SPEC RTRVL LRG 6X4 10 (ENDOMECHANICALS)
CANISTER SUCTION 2500CC (MISCELLANEOUS) ×2 IMPLANT
CATH KIT ON Q 5IN SLV (PAIN MANAGEMENT) ×1 IMPLANT
CATH THORACIC 28FR (CATHETERS) ×1 IMPLANT
CATH THORACIC 28FR RT ANG (CATHETERS) ×1 IMPLANT
CATH THORACIC 36FR (CATHETERS) IMPLANT
CATH THORACIC 36FR RT ANG (CATHETERS) IMPLANT
CLIP APPLIE ROT 10 11.4 M/L (STAPLE) IMPLANT
CLIP TI MEDIUM 6 (CLIP) ×2 IMPLANT
CONN ST 1/4X3/8  BEN (MISCELLANEOUS)
CONN ST 1/4X3/8 BEN (MISCELLANEOUS) IMPLANT
CONN Y 3/8X3/8X3/8  BEN (MISCELLANEOUS)
CONN Y 3/8X3/8X3/8 BEN (MISCELLANEOUS) IMPLANT
CONT SPEC 4OZ CLIKSEAL STRL BL (MISCELLANEOUS) ×4 IMPLANT
COVER SURGICAL LIGHT HANDLE (MISCELLANEOUS) IMPLANT
DERMABOND ADVANCED (GAUZE/BANDAGES/DRESSINGS) ×1
DERMABOND ADVANCED .7 DNX12 (GAUZE/BANDAGES/DRESSINGS) IMPLANT
DRAIN CHANNEL 28F RND 3/8 FF (WOUND CARE) ×1 IMPLANT
DRAIN CHANNEL 32F RND 10.7 FF (WOUND CARE) IMPLANT
DRAPE LAPAROSCOPIC ABDOMINAL (DRAPES) ×2 IMPLANT
DRAPE WARM FLUID 44X44 (DRAPE) ×2 IMPLANT
ELECT BLADE 6.5 EXT (BLADE) ×2 IMPLANT
ELECT REM PT RETURN 9FT ADLT (ELECTROSURGICAL) ×2
ELECTRODE REM PT RTRN 9FT ADLT (ELECTROSURGICAL) ×1 IMPLANT
GAUZE SPONGE 4X4 12PLY STRL (GAUZE/BANDAGES/DRESSINGS) ×2 IMPLANT
GLOVE SURG SIGNA 7.5 PF LTX (GLOVE) ×4 IMPLANT
GOWN STRL REUS W/ TWL LRG LVL3 (GOWN DISPOSABLE) ×2 IMPLANT
GOWN STRL REUS W/ TWL XL LVL3 (GOWN DISPOSABLE) ×1 IMPLANT
GOWN STRL REUS W/TWL LRG LVL3 (GOWN DISPOSABLE) ×4
GOWN STRL REUS W/TWL XL LVL3 (GOWN DISPOSABLE) ×2
HEMOSTAT SURGICEL 2X14 (HEMOSTASIS) IMPLANT
KIT BASIN OR (CUSTOM PROCEDURE TRAY) ×2 IMPLANT
KIT ROOM TURNOVER OR (KITS) ×2 IMPLANT
KIT SUCTION CATH 14FR (SUCTIONS) IMPLANT
NS IRRIG 1000ML POUR BTL (IV SOLUTION) ×6 IMPLANT
PACK CHEST (CUSTOM PROCEDURE TRAY) ×2 IMPLANT
PAD ARMBOARD 7.5X6 YLW CONV (MISCELLANEOUS) ×4 IMPLANT
POUCH ENDO CATCH II 15MM (MISCELLANEOUS) ×1 IMPLANT
POUCH SPECIMEN RETRIEVAL 10MM (ENDOMECHANICALS) IMPLANT
RELOAD 45 THICK GREEN (ENDOMECHANICALS) ×2 IMPLANT
RELOAD STAPLE 35X2.5 WHT THIN (STAPLE) IMPLANT
RELOAD STAPLE 45 GRN THCK ETS (ENDOMECHANICALS) IMPLANT
RELOAD STAPLE 60 3.8 GOLD REG (STAPLE) IMPLANT
RELOAD STAPLE 60 4.1 GRN THCK (STAPLE) IMPLANT
RELOAD STAPLER GOLD 60MM (STAPLE) ×3 IMPLANT
RELOAD STAPLER GREEN 60MM (STAPLE) ×1 IMPLANT
SCISSORS ENDO CVD 5DCS (MISCELLANEOUS) IMPLANT
SEALANT PROGEL (MISCELLANEOUS) IMPLANT
SEALANT SURG COSEAL 4ML (VASCULAR PRODUCTS) IMPLANT
SEALANT SURG COSEAL 8ML (VASCULAR PRODUCTS) IMPLANT
SHEARS HARMONIC HDI 20CM (ELECTROSURGICAL) ×1 IMPLANT
SOLUTION ANTI FOG 6CC (MISCELLANEOUS) ×2 IMPLANT
SPECIMEN JAR MEDIUM (MISCELLANEOUS) IMPLANT
SPONGE GAUZE 4X4 12PLY STER LF (GAUZE/BANDAGES/DRESSINGS) ×1 IMPLANT
SPONGE INTESTINAL PEANUT (DISPOSABLE) ×4 IMPLANT
SPONGE TONSIL 1 RF SGL (DISPOSABLE) ×2 IMPLANT
STAPLE ECHEON FLEX 60 POW ENDO (STAPLE) ×1 IMPLANT
STAPLE RELOAD 2.5MM WHITE (STAPLE) ×6 IMPLANT
STAPLER ENDO NO KNIFE (STAPLE) ×1 IMPLANT
STAPLER RELOAD GOLD 60MM (STAPLE) ×6
STAPLER RELOAD GREEN 60MM (STAPLE) ×2
STAPLER VASCULAR ECHELON 35 (CUTTER) ×1 IMPLANT
SUT PROLENE 4 0 RB 1 (SUTURE)
SUT PROLENE 4-0 RB1 .5 CRCL 36 (SUTURE) IMPLANT
SUT SILK  1 MH (SUTURE) ×2
SUT SILK 1 MH (SUTURE) ×2 IMPLANT
SUT SILK 1 TIES 10X30 (SUTURE) ×2 IMPLANT
SUT SILK 2 0 SH (SUTURE) IMPLANT
SUT SILK 2 0SH CR/8 30 (SUTURE) IMPLANT
SUT SILK 3 0 SH 30 (SUTURE) IMPLANT
SUT SILK 3 0SH CR/8 30 (SUTURE) ×1 IMPLANT
SUT VIC AB 1 CTX 36 (SUTURE) ×2
SUT VIC AB 1 CTX36XBRD ANBCTR (SUTURE) ×1 IMPLANT
SUT VIC AB 2-0 CTX 36 (SUTURE) ×2 IMPLANT
SUT VIC AB 2-0 UR6 27 (SUTURE) IMPLANT
SUT VIC AB 3-0 MH 27 (SUTURE) IMPLANT
SUT VIC AB 3-0 SH 27 (SUTURE) ×2
SUT VIC AB 3-0 SH 27X BRD (SUTURE) IMPLANT
SUT VIC AB 3-0 X1 27 (SUTURE) ×2 IMPLANT
SUT VICRYL 2 TP 1 (SUTURE) IMPLANT
SWAB COLLECTION DEVICE MRSA (MISCELLANEOUS) IMPLANT
SYRINGE 10CC LL (SYRINGE) ×2 IMPLANT
SYSTEM SAHARA CHEST DRAIN ATS (WOUND CARE) ×2 IMPLANT
TAPE CLOTH 4X10 WHT NS (GAUZE/BANDAGES/DRESSINGS) ×2 IMPLANT
TAPE CLOTH SURG 4X10 WHT LF (GAUZE/BANDAGES/DRESSINGS) ×1 IMPLANT
TIP APPLICATOR SPRAY EXTEND 16 (VASCULAR PRODUCTS) IMPLANT
TOWEL OR 17X24 6PK STRL BLUE (TOWEL DISPOSABLE) ×2 IMPLANT
TOWEL OR 17X26 10 PK STRL BLUE (TOWEL DISPOSABLE) ×2 IMPLANT
TRAP SPECIMEN MUCOUS 40CC (MISCELLANEOUS) IMPLANT
TRAY FOLEY CATH 16FRSI W/METER (SET/KITS/TRAYS/PACK) ×2 IMPLANT
TROCAR XCEL BLADELESS 5X75MML (TROCAR) ×2 IMPLANT
TUBE ANAEROBIC SPECIMEN COL (MISCELLANEOUS) IMPLANT
TUNNELER SHEATH ON-Q 11GX8 DSP (PAIN MANAGEMENT) IMPLANT
WATER STERILE IRR 1000ML POUR (IV SOLUTION) ×2 IMPLANT

## 2015-10-18 NOTE — Anesthesia Preprocedure Evaluation (Addendum)
Anesthesia Evaluation  Patient identified by MRN, date of birth, ID band Patient awake    Reviewed: Allergy & Precautions, NPO status , Patient's Chart, lab work & pertinent test results  Airway Mallampati: II  TM Distance: >3 FB Neck ROM: Full    Dental  (+) Edentulous Upper, Edentulous Lower   Pulmonary COPD, Current Smoker,  Lung ca   breath sounds clear to auscultation       Cardiovascular + Peripheral Vascular Disease   Rhythm:Regular Rate:Normal     Neuro/Psych  Headaches, Brain injury  Neuromuscular disease    GI/Hepatic   Endo/Other  diabetes  Renal/GU      Musculoskeletal  (+) Arthritis , Fibromyalgia -  Abdominal   Peds  Hematology   Anesthesia Other Findings   Reproductive/Obstetrics                           Anesthesia Physical Anesthesia Plan  ASA: III  Anesthesia Plan: General   Post-op Pain Management:    Induction: Intravenous  Airway Management Planned: Double Lumen EBT  Additional Equipment: Arterial line and CVP  Intra-op Plan:   Post-operative Plan: Extubation in OR and Possible Post-op intubation/ventilation  Informed Consent: I have reviewed the patients History and Physical, chart, labs and discussed the procedure including the risks, benefits and alternatives for the proposed anesthesia with the patient or authorized representative who has indicated his/her understanding and acceptance.   Dental advisory given  Plan Discussed with:   Anesthesia Plan Comments:         Anesthesia Quick Evaluation

## 2015-10-18 NOTE — H&P (View-Only) (Signed)
PCP is [name], MD Referring Provider is Curt Bears, MD  No chief complaint on file.   HPI: 55 yo man sent to [facility] for evaluation of right upper lobe carcinoma.  [name] [name] is a 55 yo man with a multitude of medical issues including tobacco abuse, Barrett's esophagus, PAD (carotid and femoral stents), history of CHI with cognitive impairment, fibromaylgia, chronic pain, major depression and type II diabetes without complication.  He recently injured his left shoulder trying to move something. Xrays were done which showed an abnormality in the right lung. A CT showed a right upper lobe mass. PET showed the mass was hypermetabolic. There was also a mildly hypermetabolic hilar node. Dr. Lake Bells did a bronchoscopy and biopsies showed a non-small cell carcinoma.  He is still smoking, now down to less than 1/2 ppd. He gets short of breath with heavy exertion but not routine activities. He has not had significant weight loss. No recent change in HA or vision. He has a frequent cough but denies hemoptysis. Denies wheezing.  Zubrod Score: At the time of surgery this patient's most appropriate activity status/level should be described as: '[]'$     0    Normal activity, no symptoms '[x]'$     1    Restricted in physical strenuous activity but ambulatory, able to do out light work '[]'$     2    Ambulatory and capable of self care, unable to do work activities, up and about >50 % of waking hours                              '[]'$     3    Only limited self care, in bed greater than 50% of waking hours '[]'$     4    Completely disabled, no self care, confined to bed or chair '[]'$     5    Moribund  Past Medical History:  Diagnosis Date  . Barrett's esophagus 07/01/2013   Without dysplasia on biopsy 09/03/2012. Repeat EGD recommended 08/2015  . Carotid artery stenosis 07/01/2013   Requiring right sided stent   . Chronic pain syndrome 07/01/2013  . Closed head injury with brief loss of consciousness (Brett [name])  07/22/2010   Head trapped in a hydraulic device at work.  Fracture of orbital bones on right and brief loss of consciousness per report.  . 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 There was likely a strong psycho-social overlay in regard to the cognitive deficits in the form of mood disorder with psychotic features and mixed anxiety symptomatology. His primary tested cognitive deficits are in the areas of attention, executi  . Degenerative joint disease of cervical spine 07/01/2013  . Dupuytren's contracture of both hands 04/08/2014  . Encounter for antineoplastic chemotherapy 10/05/2015  . Erectile dysfunction associated with type 2 diabetes mellitus (Brett [name]) 07/01/2013  . Fibromyalgia 07/01/2013  . Hyperlipidemia LDL goal < 100 07/01/2013  . Osteoarthritis of right thumb 10/21/2014  . Peripheral vascular occlusive disease (Brett [name]) 07/01/2013   Requiring 2 arterial stents above the left knee per report  . Post traumatic stress disorder 07/01/2013  . Primary lung adenocarcinoma (Brett [name])   . Severe major depression with psychotic features (Brett [name]) 04/15/2011  . Tobacco abuse 07/01/2013  . Type 2 diabetes mellitus with vascular disease (Brett [name]) 07/01/2013   Left lower extremity and right  carotid stenting    Past Surgical History:  Procedure Laterality Date  . FRACTURE SURGERY Right July 2012   Right orbital bones  . HERNIA REPAIR  [id number]   Umbilical  . VIDEO BRONCHOSCOPY Bilateral 09/21/2015   Procedure: VIDEO BRONCHOSCOPY WITH FLUORO;  Surgeon: Juanito Doom, MD;  Location: [facility];  Service: Cardiopulmonary;  Laterality: Bilateral;    Family History  Problem Relation Age of Onset  . Heart disease Mother   . Diabetes Mother   . Diabetes Father   . Heart attack Father   . Diabetes Sister   . Coronary artery disease Sister     s/p CABG  . Diabetes Brother   .  Healthy Daughter   . Healthy Son   . Diabetes Sister   . Healthy Sister   . Healthy Sister   . Diabetes Brother   . Coronary artery disease Brother     s/p CABG  . Diabetes Brother   . Healthy Brother   . Healthy Brother   . Healthy Daughter     Social History Social History  Substance Use Topics  . Smoking status: Current Every Day Smoker    Packs/day: 1.00    Years: 35.00    Types: Cigarettes  . Smokeless tobacco: Never Used     Comment: down to .5ppd 09/29/15  . Alcohol use No    Current Outpatient Prescriptions  Medication Sig Dispense Refill  . aspirin EC 81 MG tablet Take 81 mg by mouth daily.    Marland Kitchen atorvastatin (LIPITOR) 40 MG tablet Take 1 tablet (40 mg total) by mouth daily. 90 tablet 3  . clopidogrel (PLAVIX) 75 MG tablet Take 75 mg by mouth daily with breakfast.    . glipiZIDE (GLUCOTROL) 10 MG tablet Take 2 tablets (20 mg total) by mouth 2 (two) times daily before a meal. 360 tablet 3  . glucose blood (ACCU-CHEK AVIVA PLUS) test strip 1 each by Other route as needed for other (twice daily). 100 each 5  . HYDROcodone-acetaminophen (NORCO) 10-325 MG tablet Take 1 tablet by mouth every 6 (six) hours as needed for severe pain. 90 tablet 0  . lisinopril (PRINIVIL,ZESTRIL) 5 MG tablet Take 1 tablet (5 mg total) by mouth daily. 90 tablet 3  . metFORMIN (GLUCOPHAGE) 500 MG tablet Take 1 tablet (500 mg total) by mouth 2 (two) times daily with a meal. 180 tablet 3  . pantoprazole (PROTONIX) 40 MG tablet Take 1 tablet (40 mg total) by mouth daily. 90 tablet 3  . pregabalin (LYRICA) 50 MG capsule Take 1 capsule (50 mg total) by mouth 3 (three) times daily. 90 capsule 11  . prochlorperazine (COMPAZINE) 10 MG tablet Take 1 tablet (10 mg total) by mouth every 6 (six) hours as needed for nausea or vomiting. 30 tablet 0  . sitaGLIPtin (JANUVIA) 100 MG tablet Take 1 tablet (100 mg total) by mouth daily. 90 tablet 3   No current facility-administered medications for this visit.      Allergies  Allergen Reactions  . Celebrex [Celecoxib]     Nightmares  . Gabapentin Other (See Comments)    suicidal thoughts    Review of Systems  Constitutional: Negative for activity change, appetite change and unexpected weight change.  HENT: Negative for trouble swallowing and voice change.   Respiratory: Positive for cough. Negative for shortness of breath and wheezing.   Cardiovascular: Negative for chest pain, palpitations and leg swelling.  Gastrointestinal: Negative for abdominal pain and blood in stool.  Endocrine: Negative for polydipsia and polyphagia.  Genitourinary: Negative for difficulty urinating and dysuria.  Musculoskeletal: Positive for arthralgias (left shoulder), back pain, neck pain and neck stiffness. Negative for joint swelling.       Chronic pain, left leg  Neurological: Positive for headaches.  Hematological: Negative for adenopathy. Does not bruise/bleed easily.  Psychiatric/Behavioral:       PTSD    There were no vitals taken for this visit. Physical Exam  Constitutional: He is oriented to person, place, and time. He appears well-developed and well-nourished. No distress.  HENT:  Head: Normocephalic and atraumatic.  Mouth/Throat: No oropharyngeal exudate.  Eyes: Conjunctivae and EOM are normal. No scleral icterus.  Neck: Neck supple. No tracheal deviation present.  Cardiovascular: Normal rate and regular rhythm.  Exam reveals no gallop and no friction rub.   No murmur heard. Pulmonary/Chest: Effort normal and breath sounds normal. No respiratory distress. He has no wheezes. He has no rales.  Abdominal: Soft. He exhibits no distension. There is no tenderness.  Musculoskeletal: He exhibits no edema.  Lymphadenopathy:    He has no cervical adenopathy.  Neurological: He is alert and oriented to person, place, and time. A cranial nerve deficit is present.  No motor deficit  Skin: Skin is warm and dry.  Psychiatric: He has a normal mood and  affect.     Diagnostic Tests: CT CHEST WITH CONTRAST  TECHNIQUE: Multidetector CT imaging of the chest was performed during intravenous contrast administration.  CONTRAST:  32m ISOVUE-300 IOPAMIDOL (ISOVUE-300) INJECTION 61%  COMPARISON:  Chest radiographs 08/25/2015. [facility] CT Abdomen and Pelvis report 08/25/2012 (no images available).  FINDINGS: Spiculated peri-bronchovascular lesion in the right upper lobe corresponding to the recent plain radiographic finding by largest dimension measures nearly 4 cm (as seen on series 205, image 51), with the dominant complement of the lesion measuring 2 cm (coronal image 71). Although somewhat linearly arranged, the lesion does not appear to correspond to an opacified airway. Right hilar lymph node tissue appears asymmetrically increased, up to 9 mm short axis (series 21, image 58).  Major airways are patent bilaterally. No pleural effusion. No pericardial effusion. Minor scarring in the left lateral costophrenic angle.  Negative thoracic inlet. No axillary lymphadenopathy. Soft atherosclerotic plaque of the thoracic aorta. Calcified coronary artery atherosclerosis. Mediastinal and left hilar lymph nodes remain normal other major mediastinal vascular structures are within normal limits.  Negative visible liver, gallbladder, spleen, pancreas, adrenal glands, renal upper poles, and bowel in the upper abdomen.  No acute or suspicious osseous lesion identified.  IMPRESSION: 1. Spiculated and somewhat elongated right upper lobe lesion (2-4 cm) corresponding to the recent radiographic finding should be considered Bronchogenic Carcinoma unless proven otherwise. 2. Asymmetric right hilar lymph node is suspicious for metastatic nodal disease. 3. Thoracic Surgery consultation and/or PET-CT would be most appropriate at this time.  Electronically Signed: By: HGenevie AnnM.D. On: 09/01/2015 16:50  NUCLEAR MEDICINE  PET SKULL BASE TO THIGH  TECHNIQUE: 6.7 mCi F-18 FDG was injected intravenously. Full-ring PET imaging was performed from the skull base to thigh after the radiotracer. CT data was obtained and used for attenuation correction and anatomic localization.  FASTING BLOOD GLUCOSE:  Value: 159 mg/dl  COMPARISON:  09/01/2015 chest CT.  09/21/2004 CT abdomen/ pelvis.  FINDINGS: NECK  No hypermetabolic lymph nodes in the neck. Right carotid artery stent is noted.  CHEST  Hypermetabolic irregular 2.3 x 1.3 cm posterior right upper lobe pulmonary nodule with max SUV 8.7 (  series 8/image 27).  No acute consolidative airspace disease or additional significant pulmonary nodules.  Mildly enlarged hypermetabolic 1.1 cm right hilar node with max SUV 4.1 (series 4/image 73).  There are a few clustered nonenlarged hypermetabolic right axillary nodes, for example a 0.7 cm right axillary node with max SUV 4.3 (series 4/image 53).  No hypermetabolic mediastinal, left hilar or left axillary nodes.  Left anterior descending, left circumflex and right coronary atherosclerosis. Atherosclerotic nonaneurysmal thoracic aorta. No pleural effusions.  ABDOMEN/PELVIS  No abnormal hypermetabolic activity within the liver, pancreas, adrenal glands, or spleen. No hypermetabolic lymph nodes in the abdomen or pelvis. Mildly enlarged prostate with coarse nonspecific internal prostatic calcifications. Atherosclerotic nonaneurysmal abdominal aorta.  SKELETON  No focal hypermetabolic activity to suggest skeletal metastasis.  IMPRESSION: 1. Hypermetabolic irregular 2.3 cm posterior right upper lobe pulmonary nodule, consistent with primary bronchogenic carcinoma. 2. Hypermetabolic right hilar nodal metastasis. 3. Clustered non-enlarged hypermetabolic right axillary lymph nodes, nonspecific, favor benign injection related uptake (radiotracer was injected into a right antecubital vein).  Consider targeted ultrasound evaluation of the right axillary nodes. 4. No extrathoracic hypermetabolic metastatic disease. 5. Additional findings include aortic atherosclerosis, three-vessel coronary atherosclerosis and mildly enlarged prostate.   Electronically Signed   By: Ilona Sorrel M.D.   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 This is either stage I or IIA based on PET. There is a right hilar node but it may just be reactive. Regardless of whether the node is positive or not, I believe the disease is resectable with a right upper lobectomy. Whether adjuvant therapy is needed will depend on the final pathology.  I recommended we proceed with Right VATS for right upper lobectomy. I discussed the general nature of the procedure, the need for general anesthesia, and the incisions to be used with the patient and his family. We discussed the expected hospital stay, overall recovery and short and long term outcomes. I informed them of the indications, risks, benefits, and alternatives. They understand the risks include, but are not limited to death, stroke, MI, DVT/PE, bleeding, possible need for transfusion, infections, prolonged air leaks, cardiac arrhythmias, as well as the possibility of other unforeseeable complications.  He accepts the risks and agrees to proceed.  Tobacco abuse- Smoking cessation instruction/counseling given:  counseled patient on the dangers of tobacco use, advised patient to stop smoking, and reviewed strategies to maximize success  PVD with carotid and femoral stents- on plavix. He will need to hold plavix for 5 days prior to surgery.  CAD- calcification noted on CT and PET. No symptoms, no need for additional workup  preop, increased risk but not prohibitive  Plan:  Right VATS, right upper lobectomy when schedule allows, likely week of 9/25 DC plavix 5 days preop   Melrose Nakayama, MD [facility] [phone]

## 2015-10-18 NOTE — Transfer of Care (Signed)
Immediate Anesthesia Transfer of Care Note  Patient: Brett [name]  Procedure(s) Performed: Procedure(s): VIDEO ASSISTED THORACOSCOPY (VATS)/ LOBECTOMY (Right)  Patient Location: PACU  Anesthesia Type:General  Level of Consciousness: awake, alert , oriented and patient cooperative  Airway & Oxygen Therapy: Patient Spontanous Breathing and Patient connected to nasal cannula oxygen  Post-op Assessment: Report given to RN and Post -op Vital signs reviewed and stable  Post vital signs: Reviewed and stable  Last Vitals:  Vitals:   10/18/15 1153 10/18/15 1716  BP: 117/68   Pulse: 75   Resp: 18   Temp: 36.7 C 36.2 C    Last Pain:  Vitals:   10/18/15 1220  TempSrc:   PainSc: 7       Patients Stated Pain Goal: 6 (50/41/36 4383)  Complications: No apparent anesthesia complications

## 2015-10-18 NOTE — Interval H&P Note (Signed)
History and Physical Interval Note:  10/18/2015 11:43 AM  Brett [name]  has presented today for surgery, with the diagnosis of RIGHT UPPER LOBE LUNG CA  The various methods of treatment have been discussed with the patient and family. After consideration of risks, benefits and other options for treatment, the patient has consented to  Procedure(s): VIDEO ASSISTED THORACOSCOPY (VATS)/ LOBECTOMY (Right) as a surgical intervention .  The patient's history has been reviewed, patient examined, no change in status, stable for surgery.  I have reviewed the patient's chart and labs.  Questions were answered to the patient's satisfaction.     Melrose Nakayama

## 2015-10-18 NOTE — Brief Op Note (Addendum)
10/18/2015  4:47 PM  PATIENT:  Johnella Moloney Hocutt  55 y.o. male  PRE-OPERATIVE DIAGNOSIS:  RIGHT UPPER LOBE LUNG CA  POST-OPERATIVE DIAGNOSIS:  RIGHT UPPER LOBE LUNG CA  PROCEDURE:  Procedure(s):  RIGHT VIDEO ASSISTED THORACOSCOPY (VATS) -Right Upper Lobectomy -Mediastinal Lymph Node Dissection -Insertion of On-Q Catheter  SURGEON:  Surgeon(s) and Role:    * Melrose Nakayama, MD - Primary  PHYSICIAN ASSISTANT: Ellwood Handler PA-C  ANESTHESIA:   general  EBL:  Total I/O In: 900 [I.V.:900] Out: 180 [Urine:130; Blood:50]  BLOOD ADMINISTERED:none  DRAINS: 28 Blake, 28 Straight Chest Tube Right Chest   LOCAL MEDICATIONS USED:  MARCAINE     SPECIMEN:  Source of Specimen:  Right Upper Lobe, Lymph Node Dissection  DISPOSITION OF SPECIMEN:  PATHOLOGY  COUNTS:  YES  PLAN OF CARE: Admit to inpatient   PATIENT DISPOSITION:  PACU - hemodynamically stable.   Delay start of Pharmacological VTE agent (>24hrs) due to surgical blood loss or risk of bleeding: yes  Margins negative for tumor

## 2015-10-19 ENCOUNTER — Inpatient Hospital Stay (HOSPITAL_COMMUNITY): Payer: Medicare Other

## 2015-10-19 ENCOUNTER — Telehealth: Payer: Self-pay | Admitting: Internal Medicine

## 2015-10-19 ENCOUNTER — Encounter (HOSPITAL_COMMUNITY): Payer: Self-pay | Admitting: Thoracic Surgery (Cardiothoracic Vascular Surgery)

## 2015-10-19 LAB — GLUCOSE, CAPILLARY
Glucose-Capillary: 187 mg/dL — ABNORMAL HIGH (ref 65–99)
Glucose-Capillary: 107 mg/dL — ABNORMAL HIGH (ref 65–99)
Glucose-Capillary: 193 mg/dL — ABNORMAL HIGH (ref 65–99)
Glucose-Capillary: 174 mg/dL — ABNORMAL HIGH (ref 65–99)
Glucose-Capillary: 126 mg/dL — ABNORMAL HIGH (ref 65–99)

## 2015-10-19 LAB — CBC
HCT: 32.4 % — ABNORMAL LOW (ref 39.0–52.0)
Hemoglobin: 10.4 g/dL — ABNORMAL LOW (ref 13.0–17.0)
MCH: 29.3 pg (ref 26.0–34.0)
MCHC: 32.1 g/dL (ref 30.0–36.0)
MCV: 91.3 fL (ref 78.0–100.0)
Platelets: 279 10*3/uL (ref 150–400)
RBC: 3.55 MIL/uL — ABNORMAL LOW (ref 4.22–5.81)
RDW: 13 % (ref 11.5–15.5)
WBC: 11.8 10*3/uL — ABNORMAL HIGH (ref 4.0–10.5)

## 2015-10-19 LAB — BASIC METABOLIC PANEL
Anion gap: 4 — ABNORMAL LOW (ref 5–15)
BUN: 10 mg/dL (ref 6–20)
CO2: 25 mmol/L (ref 22–32)
Calcium: 8.1 mg/dL — ABNORMAL LOW (ref 8.9–10.3)
Chloride: 107 mmol/L (ref 101–111)
Creatinine, Ser: 0.64 mg/dL (ref 0.61–1.24)
GFR calc Af Amer: >60 mL/min (ref >60)
GFR calc non Af Amer: >60 mL/min (ref >60)
Glucose, Bld: 105 mg/dL — ABNORMAL HIGH (ref 65–99)
Potassium: 3.9 mmol/L (ref 3.5–5.1)
Sodium: 136 mmol/L (ref 135–145)

## 2015-10-19 LAB — BLOOD GAS, ARTERIAL
Acid-base deficit: 0.9 mmol/L (ref 0.0–2.0)
Bicarbonate: 23.7 mmol/L (ref 20.0–28.0)
Drawn by: 460981
FIO2: 21
O2 Saturation: 97.6 %
Patient temperature: 98.6
pCO2 arterial: 42.8 mmHg (ref 32.0–48.0)
pH, Arterial: 7.362 (ref 7.350–7.450)
pO2, Arterial: 105 mmHg (ref 83.0–108.0)

## 2015-10-19 MED ORDER — LINAGLIPTIN 5 MG PO TABS
5.0000 mg | ORAL_TABLET | Freq: Every day | ORAL | Status: DC
  Administered 2015-10-19 – 2015-10-30 (×12): 5 mg via ORAL
  Filled 2015-10-19 (×12): qty 1

## 2015-10-19 MED ORDER — INSULIN ASPART 100 UNIT/ML ~~LOC~~ SOLN
0.0000 [IU] | Freq: Three times a day (TID) | SUBCUTANEOUS | Status: DC
  Administered 2015-10-19 (×2): 3 [IU] via SUBCUTANEOUS
  Administered 2015-10-20 (×2): 2 [IU] via SUBCUTANEOUS
  Administered 2015-10-21: 8 [IU] via SUBCUTANEOUS
  Administered 2015-10-23: 2 [IU] via SUBCUTANEOUS
  Administered 2015-10-23 – 2015-10-24 (×2): 3 [IU] via SUBCUTANEOUS
  Administered 2015-10-24: 2 [IU] via SUBCUTANEOUS
  Administered 2015-10-25: 3 [IU] via SUBCUTANEOUS
  Administered 2015-10-25: 2 [IU] via SUBCUTANEOUS
  Administered 2015-10-25 – 2015-10-26 (×2): 3 [IU] via SUBCUTANEOUS
  Administered 2015-10-26 – 2015-10-27 (×2): 2 [IU] via SUBCUTANEOUS
  Administered 2015-10-27 – 2015-10-28 (×2): 3 [IU] via SUBCUTANEOUS
  Administered 2015-10-28: 2 [IU] via SUBCUTANEOUS
  Administered 2015-10-29: 3 [IU] via SUBCUTANEOUS
  Administered 2015-10-30: 2 [IU] via SUBCUTANEOUS
  Administered 2015-10-31: 3 [IU] via SUBCUTANEOUS

## 2015-10-19 MED ORDER — CLOPIDOGREL BISULFATE 75 MG PO TABS
75.0000 mg | ORAL_TABLET | Freq: Every day | ORAL | Status: DC
  Administered 2015-10-20 – 2015-10-31 (×12): 75 mg via ORAL
  Filled 2015-10-19 (×13): qty 1

## 2015-10-19 MED ORDER — METFORMIN HCL 500 MG PO TABS
500.0000 mg | ORAL_TABLET | Freq: Two times a day (BID) | ORAL | Status: DC
  Administered 2015-10-19 – 2015-10-31 (×25): 500 mg via ORAL
  Filled 2015-10-19 (×26): qty 1

## 2015-10-19 MED ORDER — ENOXAPARIN SODIUM 40 MG/0.4ML ~~LOC~~ SOLN
40.0000 mg | SUBCUTANEOUS | Status: DC
  Administered 2015-10-19 – 2015-10-31 (×13): 40 mg via SUBCUTANEOUS
  Filled 2015-10-19 (×13): qty 0.4

## 2015-10-19 MED ORDER — INSULIN ASPART 100 UNIT/ML ~~LOC~~ SOLN: 0.0000 [IU] | Freq: Every day | SUBCUTANEOUS | Status: DC

## 2015-10-19 NOTE — Anesthesia Postprocedure Evaluation (Signed)
Anesthesia Post Note  Patient: Brett [name]  Procedure(s) Performed: Procedure(s) (LRB): VIDEO ASSISTED THORACOSCOPY (VATS)/ LOBECTOMY (Right)  Patient location during evaluation: PACU Anesthesia Type: General Level of consciousness: awake, sedated and oriented Pain management: pain level controlled Vital Signs Assessment: post-procedure vital signs reviewed and stable Respiratory status: spontaneous breathing, nonlabored ventilation, respiratory function stable and patient connected to nasal cannula oxygen Cardiovascular status: blood pressure returned to baseline and stable Postop Assessment: no signs of nausea or vomiting Anesthetic complications: no    Last Vitals:  Vitals:   10/19/15 0400 10/19/15 0800  BP: (!) 96/59 93/62  Pulse: 76 76  Resp: 14 20  Temp:  36.7 C    Last Pain:  Vitals:   10/19/15 0800  TempSrc: Oral  PainSc: 2                  Ly Wass,JAMES TERRILL

## 2015-10-19 NOTE — Care Management Note (Signed)
Case Management Note  Patient Details  Name: Brett [name] MRN: [id number] Date of Birth: [birth year]  Subjective/Objective:   S/P Vats , lobectomy, has chest tubes x 2, air leak, pca, He lives with wife at home, pta indep.  He has medication coverage, he has a PCP, and he has transportation at dc.  NCM will cont to follow for dc needs.                  Action/Plan:   Expected Discharge Date:                  Expected Discharge Plan:  Home/Self Care  In-House Referral:     Discharge planning Services  CM Consult  Post Acute Care Choice:    Choice offered to:     DME Arranged:    DME Agency:     HH Arranged:    HH Agency:     Status of Service:  In process, will continue to follow  If discussed at Long Length of Stay Meetings, dates discussed:    Additional Comments:  [name], RN 10/19/2015, 12:13 PM

## 2015-10-19 NOTE — Progress Notes (Signed)
1 Day Post-Op Procedure(s) (LRB): VIDEO ASSISTED THORACOSCOPY (VATS)/ LOBECTOMY (Right) Subjective: Some incisional pain- "not too much" Denies nausea  Objective: Vital signs in last 24 hours: Temp:  [97.1 F (36.2 C)-98.6 F (37 C)] 98.1 F (36.7 C) (09/28 0800) Pulse Rate:  [75-93] 76 (09/28 0800) Cardiac Rhythm: Normal sinus rhythm (09/28 0800) Resp:  [12-22] 20 (09/28 0800) BP: (93-131)/(59-81) 93/62 (09/28 0800) SpO2:  [96 %-100 %] 100 % (09/28 0800) Arterial Line BP: (111-143)/(58-67) 111/58 (09/28 0800) Weight:  [135 lb 7 oz (61.4 kg)-138 lb 0.1 oz (62.6 kg)] 138 lb 0.1 oz (62.6 kg) (09/27 1910)  Hemodynamic parameters for last 24 hours:    Intake/Output from previous day: 09/27 0701 - 09/28 0700 In: 3180 [P.O.:480; I.V.:2600; IV Piggyback:100] Out: 790 [Urine:590; Blood:50; Chest Tube:150] Intake/Output this shift: Total I/O In: 540 [P.O.:240; I.V.:300] Out: 975 [Urine:775; Chest Tube:200]  General appearance: alert, cooperative and no distress Neurologic: no change from baseline Heart: regular rate and rhythm Lungs: diminished breath sounds right base Abdomen: mildly distended, good BS + air leak  Lab Results:  Recent Labs  10/17/15 1304 10/19/15 0552  WBC 8.3 11.8*  HGB 12.0* 10.4*  HCT 36.6* 32.4*  PLT 329 279   BMET:  Recent Labs  10/17/15 1304 10/19/15 0552  NA 135 136  K 4.0 3.9  CL 106 107  CO2 22 25  GLUCOSE 224* 105*  BUN 8 10  CREATININE 0.62 0.64  CALCIUM 9.3 8.1*    PT/INR:  Recent Labs  10/17/15 1304  LABPROT 12.6  INR 0.95   ABG    Component Value Date/Time   PHART 7.362 10/19/2015 0430   HCO3 23.7 10/19/2015 0430   ACIDBASEDEF 0.9 10/19/2015 0430   O2SAT 97.6 10/19/2015 0430   CBG (last 3)   Recent Labs  10/18/15 2336 10/19/15 0305 10/19/15 0757  GLUCAP 255* 187* 107*    Assessment/Plan: S/P Procedure(s) (LRB): VIDEO ASSISTED THORACOSCOPY (VATS)/ LOBECTOMY (Right) -POD # 1 CV- in SR, BP a little on low  side, follow  RESP_ air leak- keep CT to suction today  IS, nebs  RENAL- creatinine and lytes OK  ENDO- CBG highly variable- restart metformin and januvia today. Resume glipizide tomorrow  CBG and SSI QAC and  HS  Pain control- on home Lyrica dose  PCA + On-Q + toradol for 24 hours  SCD + enoxaparin for DVT prophylaxis  Ambulate   LOS: 1 day    Melrose Nakayama 10/19/2015

## 2015-10-19 NOTE — Op Note (Signed)
Brett [name], Brett [name] NO.:  [id number]  MEDICAL RECORD NO.:  [id number]  LOCATION:  3S06C                        FACILITY:  [facility]  PHYSICIAN:  Revonda Standard. Roxan Hockey, M.D.DATE OF BIRTH:  1960-10-17  DATE OF PROCEDURE:  10/18/2015 DATE OF DISCHARGE:                              OPERATIVE REPORT   PREOPERATIVE DIAGNOSIS:  Non-small cell carcinoma right upper lobe.  POSTOPERATIVE DIAGNOSIS:  Non-small cell carcinoma right upper lobe.  PROCEDURE:   Right video-assisted thoracoscopy, Thoracoscopic right upper lobectomy, Mediastinal lymph node dissection, Insertion of On-Q local anesthetic catheter.  SURGEON: Revonda Standard. Roxan Hockey, MD  ASSISTANT:  Ellwood Handler, PA-C.  ANESTHESIA:  General.  FINDINGS:   Severe inflammatory reaction Multiple normal-appearing lymph nodes Bronchial margin negative for tumor.  CLINICAL NOTE:  Brett [name] is a 55 year old man with a history of tobacco abuse who recently was diagnosed with a non-small cell lung cancer.  He was referred to the Multidisciplinary [facility]cology Clinic for consultation.  It was felt that he was a candidate for surgical resection.  The indications, risks, benefits, and alternatives were discussed in detail with the patient.  He understood and accepted the risks and agreed to proceed.  OPERATIVE NOTE:  Brett [name] was brought to the preoperative holding area on October 18, 2015.  Anesthesia placed a central line and an arterial blood pressure monitoring line.  He was taken to the operating room, anesthetized, and intubated.  A Foley catheter was placed.  Intravenous antibiotics were administered.  Sequential compression devices were placed on the calves for DVT prophylaxis.  He was placed in a left lateral decubitus position, and the right chest was prepped and draped in the usual sterile fashion.  Single lung ventilation of the left lung was initiated and was tolerated well throughout the  procedure.  An incision made in the seventh intercostal space in the midaxillary line.  A 5 mm port was inserted into the chest.  A thoracoscope was advanced into the chest.  There was good isolation of the right lung. The fissures were well-developed.  There was no pleural effusion or abnormality of the parietal pleura.  There was some invagination of the inferior aspect of the visceral pleura on the right upper lobe.  There were no adhesions to the lower lobe.  A working incision was made anterolaterally in the fourth interspace.  The inferior ligament was divided with the Harmonic Scalpel.  A small level 9 lymph node was removed.  All lymph nodes that were encountered during the dissection were sent as separate specimens for permanent pathology.  The pleural reflection was divided at the hilum anteriorly and posteriorly.  The superior pulmonary vein was identified.  The middle lobe branch was identified.  The upper lobe branches were identified and dissected out, encircled and divided with the endoscopic vascular stapler.  The main pulmonary artery was identified.  There was significant thickened tissue overlying the upper lobe branches.  This was carefully dissected off. This was tedious and took a long time.  There was no significant lymph node tissue encountered.  The dissection was carried more superiorly around the apical branch of the PA, and a node was encountered  consistent with the node seen on CT.  There were actually 2 nodes in this area.  They were sent in to pathology as nodes 10R and 10R #2.  The anterior and apical branches then were encircled and they were likewise divided with the endoscopic vascular stapler.  The major fissure was relatively complete.  The minor fissure was completed with sequential firings of an endoscopic stapler using gold cartridges.  The dissection was carried into the major fissure along the pulmonary artery.  There was a small vein across the  posterior ascending branch, which was divided with the Harmonic Scalpel.  The posterior ascending branch then was encircled and divided with the endoscopic vascular stapler.  A stapler with a green cartridge then was placed across the right upper lobe bronchus and closed.  A test inflation showed good aeration of the lower and middle lobes.  The stapler was fired transecting the bronchus.  The specimen was placed into an endoscopic retrieval bag and sent for frozen section on the bronchial margin.  The middle lobe was tacked to the lower lobe with a noncutting stapler after ensuring correct anatomic orientation.  The subcarinal space was opened, but no significant nodal tissue was encountered. There were more enlarged nodes in the right paratracheal region.  These were dissected out with the Harmonic Scalpel and sent as level 4R lymph nodes.  The chest was copiously irrigated with warm saline.  A test inflation to 30 cm of water revealed no leakage from the bronchial stump.  An On-Q local anesthetic catheter was placed.  The patient was very thin, and a subpleural position could not be obtained due to repeated tears in the pleura; therefore, the catheter was placed in an extrapleural plane outside the rib cage.  A 28-F Blake drain and a 3F chest tube were placed through the original port incision and a second port incision.  The right lung was reinflated.  There was good aeration of the lower and middle lobe.  The working incision was closed with a running #1 Vicryl fascial suture.  The subcutaneous tissue and skin were closed in standard fashion.  The chest tubes were placed to suction. The patient was placed back in a supine position.  He was extubated in the operating room and taken to the postanesthetic care unit in good condition.     Revonda Standard Roxan Hockey, M.D.     SCH/MEDQ  D:  10/18/2015  T:  10/19/2015  Job:  [id number]

## 2015-10-19 NOTE — Telephone Encounter (Signed)
Left message re 11/3. Schedule mailed

## 2015-10-20 ENCOUNTER — Inpatient Hospital Stay (HOSPITAL_COMMUNITY): Payer: Medicare Other

## 2015-10-20 DIAGNOSIS — Z902 Acquired absence of lung [part of]: Secondary | ICD-10-CM

## 2015-10-20 LAB — GLUCOSE, CAPILLARY
Glucose-Capillary: 134 mg/dL — ABNORMAL HIGH (ref 65–99)
Glucose-Capillary: 136 mg/dL — ABNORMAL HIGH (ref 65–99)
Glucose-Capillary: 157 mg/dL — ABNORMAL HIGH (ref 65–99)
Glucose-Capillary: 173 mg/dL — ABNORMAL HIGH (ref 65–99)

## 2015-10-20 LAB — CBC
HCT: 32.8 % — ABNORMAL LOW (ref 39.0–52.0)
Hemoglobin: 10.3 g/dL — ABNORMAL LOW (ref 13.0–17.0)
MCH: 29.3 pg (ref 26.0–34.0)
MCHC: 31.4 g/dL (ref 30.0–36.0)
MCV: 93.4 fL (ref 78.0–100.0)
Platelets: 267 10*3/uL (ref 150–400)
RBC: 3.51 MIL/uL — ABNORMAL LOW (ref 4.22–5.81)
RDW: 13.4 % (ref 11.5–15.5)
WBC: 9.9 10*3/uL (ref 4.0–10.5)

## 2015-10-20 LAB — COMPREHENSIVE METABOLIC PANEL
ALT: 12 U/L — ABNORMAL LOW (ref 17–63)
AST: 17 U/L (ref 15–41)
Albumin: 2.9 g/dL — ABNORMAL LOW (ref 3.5–5.0)
Alkaline Phosphatase: 74 U/L (ref 38–126)
Anion gap: 4 — ABNORMAL LOW (ref 5–15)
BUN: 11 mg/dL (ref 6–20)
CO2: 25 mmol/L (ref 22–32)
Calcium: 8.2 mg/dL — ABNORMAL LOW (ref 8.9–10.3)
Chloride: 107 mmol/L (ref 101–111)
Creatinine, Ser: 0.75 mg/dL (ref 0.61–1.24)
GFR calc Af Amer: >60 mL/min (ref >60)
GFR calc non Af Amer: >60 mL/min (ref >60)
Glucose, Bld: 134 mg/dL — ABNORMAL HIGH (ref 65–99)
Potassium: 3.8 mmol/L (ref 3.5–5.1)
Sodium: 136 mmol/L (ref 135–145)
Total Bilirubin: 0.6 mg/dL (ref 0.3–1.2)
Total Protein: 5.9 g/dL — ABNORMAL LOW (ref 6.5–8.1)

## 2015-10-20 MED ORDER — LEVALBUTEROL HCL 0.63 MG/3ML IN NEBU
0.6300 mg | INHALATION_SOLUTION | Freq: Two times a day (BID) | RESPIRATORY_TRACT | Status: DC
  Administered 2015-10-20: 0.63 mg via RESPIRATORY_TRACT
  Filled 2015-10-20: qty 3

## 2015-10-20 NOTE — Progress Notes (Signed)
2 Days Post-Op Procedure(s) (LRB): VIDEO ASSISTED THORACOSCOPY (VATS)/ LOBECTOMY (Right) Subjective: Some incisional pain, denies nausea  Objective: Vital signs in last 24 hours: Temp:  [97.8 F (36.6 C)-99.3 F (37.4 C)] 98.5 F (36.9 C) (09/29 0700) Pulse Rate:  [78-91] 90 (09/29 0700) Cardiac Rhythm: Normal sinus rhythm (09/29 0400) Resp:  [15-28] 18 (09/29 0700) BP: (95-116)/(67-83) 116/83 (09/29 0700) SpO2:  [94 %-98 %] 97 % (09/29 0700)  Hemodynamic parameters for last 24 hours:    Intake/Output from previous day: 09/28 0701 - 09/29 0700 In: 2592.5 [P.O.:720; I.V.:1522.5] Out: 2475 [Urine:2035; Chest Tube:440] Intake/Output this shift: No intake/output data recorded.  General appearance: alert, cooperative and no distress Neurologic: intact Heart: regular rate and rhythm Lungs: diminished breath sounds right base Abdomen: +BS, tympanitic, nontender Wound: clean and dry + air leak  Lab Results:  Recent Labs  10/19/15 0552 10/20/15 0456  WBC 11.8* 9.9  HGB 10.4* 10.3*  HCT 32.4* 32.8*  PLT 279 267   BMET:  Recent Labs  10/19/15 0552 10/20/15 0456  NA 136 136  K 3.9 3.8  CL 107 107  CO2 25 25  GLUCOSE 105* 134*  BUN 10 11  CREATININE 0.64 0.75  CALCIUM 8.1* 8.2*    PT/INR:  Recent Labs  10/17/15 1304  LABPROT 12.6  INR 0.95   ABG    Component Value Date/Time   PHART 7.362 10/19/2015 0430   HCO3 23.7 10/19/2015 0430   ACIDBASEDEF 0.9 10/19/2015 0430   O2SAT 97.6 10/19/2015 0430   CBG (last 3)   Recent Labs  10/19/15 1214 10/19/15 1628 10/19/15 2106  GLUCAP 193* 174* 126*    Assessment/Plan: S/P Procedure(s) (LRB): VIDEO ASSISTED THORACOSCOPY (VATS)/ LOBECTOMY (Right) -  Doing well overall Minimal drainage from CT overnight- dc posterior CT Still has an air leak- keep anterior CT- try on water seal Tolerating PO SCD + enoxaparin for DVT prophylaxis Continue ambulation   LOS: 2 days    Melrose Nakayama 10/20/2015

## 2015-10-20 NOTE — Progress Notes (Signed)
[facility]  Came by for courtesy visit.  He is doing well.  Appreciate TCTS excellent care.  Will f/u pathology report.  Please let us know if pulmonary can be of assistance.  Roselie Awkward, MD [place] [facility] Pager: [phone] Cell: [phone] After 3pm or if no response, call [phone]

## 2015-10-20 NOTE — Care Management Note (Signed)
Case Management Note  Patient Details  Name: Brett [name] MRN: [id number] Date of Birth: 1960-05-01  Subjective/Objective:   S/P Vats , lobectomy, has chest tubes x 2, air leak, pca, He lives with wife at home, pta indep.  He has medication coverage, he has a PCP, and he has transportation at dc.  NCM will cont to follow for dc needs.                  Action/Plan:   Expected Discharge Date:                  Expected Discharge Plan:  Home/Self Care  In-House Referral:     Discharge planning Services  CM Consult  Post Acute Care Choice:    Choice offered to:     DME Arranged:    DME Agency:     HH Arranged:    HH Agency:     Status of Service:  In process, will continue to follow  If discussed at Long Length of Stay Meetings, dates discussed:    Additional Comments:  [name], RN 10/20/2015, 2:26 PM

## 2015-10-21 ENCOUNTER — Inpatient Hospital Stay (HOSPITAL_COMMUNITY): Payer: Medicare Other

## 2015-10-21 LAB — GLUCOSE, CAPILLARY
Glucose-Capillary: 284 mg/dL — ABNORMAL HIGH (ref 65–99)
Glucose-Capillary: 94 mg/dL (ref 65–99)
Glucose-Capillary: 118 mg/dL — ABNORMAL HIGH (ref 65–99)
Glucose-Capillary: 60 mg/dL — ABNORMAL LOW (ref 65–99)
Glucose-Capillary: 74 mg/dL (ref 65–99)

## 2015-10-21 LAB — CBC
HCT: 35.3 % — ABNORMAL LOW (ref 39.0–52.0)
Hemoglobin: 11.2 g/dL — ABNORMAL LOW (ref 13.0–17.0)
MCH: 29.3 pg (ref 26.0–34.0)
MCHC: 31.7 g/dL (ref 30.0–36.0)
MCV: 92.4 fL (ref 78.0–100.0)
Platelets: 280 10*3/uL (ref 150–400)
RBC: 3.82 MIL/uL — ABNORMAL LOW (ref 4.22–5.81)
RDW: 13.2 % (ref 11.5–15.5)
WBC: 8.8 10*3/uL (ref 4.0–10.5)

## 2015-10-21 LAB — BASIC METABOLIC PANEL
Anion gap: 7 (ref 5–15)
BUN: 7 mg/dL (ref 6–20)
CO2: 26 mmol/L (ref 22–32)
Calcium: 8.7 mg/dL — ABNORMAL LOW (ref 8.9–10.3)
Chloride: 105 mmol/L (ref 101–111)
Creatinine, Ser: 0.64 mg/dL (ref 0.61–1.24)
GFR calc Af Amer: >60 mL/min (ref >60)
GFR calc non Af Amer: >60 mL/min (ref >60)
Glucose, Bld: 144 mg/dL — ABNORMAL HIGH (ref 65–99)
Potassium: 3.8 mmol/L (ref 3.5–5.1)
Sodium: 138 mmol/L (ref 135–145)

## 2015-10-21 MED ORDER — POLYETHYLENE GLYCOL 3350 17 G PO PACK
17.0000 g | PACK | Freq: Every day | ORAL | Status: DC | PRN
  Administered 2015-10-21: 17 g via ORAL
  Filled 2015-10-21: qty 1

## 2015-10-21 MED ORDER — GLUCOSE 40 % PO GEL
ORAL | Status: AC
  Administered 2015-10-21: 22:00:00
  Filled 2015-10-21: qty 1

## 2015-10-21 MED ORDER — LEVALBUTEROL HCL 0.63 MG/3ML IN NEBU: 0.6300 mg | INHALATION_SOLUTION | Freq: Four times a day (QID) | RESPIRATORY_TRACT | Status: DC | PRN

## 2015-10-21 MED ORDER — BISACODYL 10 MG RE SUPP
10.0000 mg | Freq: Once | RECTAL | Status: AC
  Administered 2015-10-21: 10 mg via RECTAL
  Filled 2015-10-21: qty 1

## 2015-10-21 MED ORDER — GLIPIZIDE 5 MG PO TABS
20.0000 mg | ORAL_TABLET | Freq: Two times a day (BID) | ORAL | Status: DC
  Administered 2015-10-21 – 2015-10-31 (×20): 20 mg via ORAL
  Filled 2015-10-21 (×20): qty 4

## 2015-10-21 NOTE — Progress Notes (Signed)
3 Days Post-Op Procedure(s) (LRB): VIDEO ASSISTED THORACOSCOPY (VATS)/ LOBECTOMY (Right) Subjective: C/o incisional pain  Objective: Vital signs in last 24 hours: Temp:  [98.2 F (36.8 C)-98.6 F (37 C)] 98.5 F (36.9 C) (09/30 0700) Pulse Rate:  [79-91] 79 (09/30 0700) Cardiac Rhythm: Normal sinus rhythm (09/30 0700) Resp:  [17-24] 17 (09/30 0700) BP: (102-147)/(70-100) 147/100 (09/30 0700) SpO2:  [98 %-100 %] 100 % (09/30 0700)  Hemodynamic parameters for last 24 hours:    Intake/Output from previous day: 09/29 0701 - 09/30 0700 In: 1057 [P.O.:240; I.V.:817] Out: 1865 [SQZYT:4621; Chest Tube:90] Intake/Output this shift: No intake/output data recorded.  General appearance: alert, cooperative and mild distress Neurologic: intact Heart: regular rate and rhythm Lungs: diminished breath sounds right base Wound: clean and dry + air leak with cough  Lab Results:  Recent Labs  10/20/15 0456 10/21/15 0221  WBC 9.9 8.8  HGB 10.3* 11.2*  HCT 32.8* 35.3*  PLT 267 280   BMET:  Recent Labs  10/20/15 0456 10/21/15 0221  NA 136 138  K 3.8 3.8  CL 107 105  CO2 25 26  GLUCOSE 134* 144*  BUN 11 7  CREATININE 0.75 0.64  CALCIUM 8.2* 8.7*    PT/INR: No results for input(s): LABPROT, INR in the last 72 hours. ABG    Component Value Date/Time   PHART 7.362 10/19/2015 0430   HCO3 23.7 10/19/2015 0430   ACIDBASEDEF 0.9 10/19/2015 0430   O2SAT 97.6 10/19/2015 0430   CBG (last 3)   Recent Labs  10/20/15 1724 10/20/15 2121 10/21/15 1006  GLUCAP 157* 173* 284*    Assessment/Plan: S/P Procedure(s) (LRB): VIDEO ASSISTED THORACOSCOPY (VATS)/ LOBECTOMY (Right) -  POD # 3 lobectomy Path still pending CBG elevated this AM- restart glucotrol, continue metformin, tradjenta, SSI Air leak- keep CT in place. CXR pending ambulation   LOS: 3 days    Melrose Nakayama 10/21/2015

## 2015-10-22 ENCOUNTER — Inpatient Hospital Stay (HOSPITAL_COMMUNITY): Payer: Medicare Other

## 2015-10-22 LAB — GLUCOSE, CAPILLARY
Glucose-Capillary: 103 mg/dL — ABNORMAL HIGH (ref 65–99)
Glucose-Capillary: 117 mg/dL — ABNORMAL HIGH (ref 65–99)
Glucose-Capillary: 72 mg/dL (ref 65–99)
Glucose-Capillary: 94 mg/dL (ref 65–99)
Glucose-Capillary: 97 mg/dL (ref 65–99)

## 2015-10-22 NOTE — Progress Notes (Signed)
4 Days Post-Op Procedure(s) (LRB): VIDEO ASSISTED THORACOSCOPY (VATS)/ LOBECTOMY (Right) Subjective: + flatus. Pain much better   Objective: Vital signs in last 24 hours: Temp:  [97.6 F (36.4 C)-98.4 F (36.9 C)] 98.4 F (36.9 C) (10/01 0650) Pulse Rate:  [68-101] 71 (10/01 0701) Cardiac Rhythm: Normal sinus rhythm (10/01 0738) Resp:  [14-27] 18 (10/01 0701) BP: (116-125)/(78-92) 118/86 (10/01 0701) SpO2:  [97 %-100 %] 98 % (10/01 0701)  Hemodynamic parameters for last 24 hours:    Intake/Output from previous day: 09/30 0701 - 10/01 0700 In: 1140 [P.O.:840; I.V.:300] Out: 1465 [Urine:1425; Chest Tube:40] Intake/Output this shift: Total I/O In: 250.2 [P.O.:240; I.V.:10.2] Out: 0   General appearance: alert, cooperative and no distress Neurologic: intact Heart: regular rate and rhythm Lungs: diminished breath sounds right base Abdomen: normal findings: soft, non-tender no air leak with respirations, tiny leak with cough  Lab Results:  Recent Labs  10/20/15 0456 10/21/15 0221  WBC 9.9 8.8  HGB 10.3* 11.2*  HCT 32.8* 35.3*  PLT 267 280   BMET:  Recent Labs  10/20/15 0456 10/21/15 0221  NA 136 138  K 3.8 3.8  CL 107 105  CO2 25 26  GLUCOSE 134* 144*  BUN 11 7  CREATININE 0.75 0.64  CALCIUM 8.2* 8.7*    PT/INR: No results for input(s): LABPROT, INR in the last 72 hours. ABG    Component Value Date/Time   PHART 7.362 10/19/2015 0430   HCO3 23.7 10/19/2015 0430   ACIDBASEDEF 0.9 10/19/2015 0430   O2SAT 97.6 10/19/2015 0430   CBG (last 3)   Recent Labs  10/21/15 2222 10/22/15 0012 10/22/15 0717  GLUCAP 74 97 94    Assessment/Plan: S/P Procedure(s) (LRB): VIDEO ASSISTED THORACOSCOPY (VATS)/ LOBECTOMY (Right)  POD # 4 Doing well Will leave tube on water seal today Hopefully can dc tube in AM Increase ambulation   LOS: 4 days    Melrose Nakayama 10/22/2015

## 2015-10-23 ENCOUNTER — Inpatient Hospital Stay (HOSPITAL_COMMUNITY): Payer: Medicare Other

## 2015-10-23 LAB — GLUCOSE, CAPILLARY
Glucose-Capillary: 80 mg/dL (ref 65–99)
Glucose-Capillary: 138 mg/dL — ABNORMAL HIGH (ref 65–99)
Glucose-Capillary: 128 mg/dL — ABNORMAL HIGH (ref 65–99)
Glucose-Capillary: 113 mg/dL — ABNORMAL HIGH (ref 65–99)

## 2015-10-23 NOTE — Care Management Important Message (Signed)
Important Message  Patient Details  Name: Brett [name] MRN: [id number] Date of Birth: 1960-04-22   Medicare Important Message Given:  Yes    Nathen May 10/23/2015, 12:48 PM

## 2015-10-23 NOTE — Progress Notes (Signed)
5 Days Post-Op Procedure(s) (LRB): VIDEO ASSISTED THORACOSCOPY (VATS)/ LOBECTOMY (Right) Subjective: Feel better today. Wants to go home + BM  Objective: Vital signs in last 24 hours: Temp:  [97.6 F (36.4 C)-98.6 F (37 C)] 97.9 F (36.6 C) (10/02 0326) Pulse Rate:  [72-87] 72 (10/02 0326) Cardiac Rhythm: Normal sinus rhythm (10/02 0326) Resp:  [18-27] 27 (10/02 0326) BP: (112-127)/(76-80) 127/78 (10/02 0326) SpO2:  [98 %-100 %] 100 % (10/02 0326)  Hemodynamic parameters for last 24 hours:    Intake/Output from previous day: 10/01 0701 - 10/02 0700 In: 1860 [P.O.:1200; I.V.:660] Out: 1240 [Urine:1200; Chest Tube:40] Intake/Output this shift: No intake/output data recorded.  General appearance: alert, cooperative and no distress Neurologic: intact Heart: regular rate and rhythm Lungs: diminished breath sounds right base Abdomen: normal findings: soft, non-tender Wound: clean and dry extreme tidal variation but no air leak from CT  Lab Results:  Recent Labs  10/21/15 0221  WBC 8.8  HGB 11.2*  HCT 35.3*  PLT 280   BMET:  Recent Labs  10/21/15 0221  NA 138  K 3.8  CL 105  CO2 26  GLUCOSE 144*  BUN 7  CREATININE 0.64  CALCIUM 8.7*    PT/INR: No results for input(s): LABPROT, INR in the last 72 hours. ABG    Component Value Date/Time   PHART 7.362 10/19/2015 0430   HCO3 23.7 10/19/2015 0430   ACIDBASEDEF 0.9 10/19/2015 0430   O2SAT 97.6 10/19/2015 0430   CBG (last 3)   Recent Labs  10/22/15 1129 10/22/15 1636 10/22/15 2108  GLUCAP 117* 72 103*    Assessment/Plan: S/P Procedure(s) (LRB): VIDEO ASSISTED THORACOSCOPY (VATS)/ LOBECTOMY (Right) -Doing well No air leak- dc CT Increase ambulation Possibly home tomorrow Path still pending   LOS: 5 days    Melrose Nakayama 10/23/2015

## 2015-10-23 NOTE — Discharge Summary (Signed)
Physician Discharge Summary  Patient ID: Brett [name] MRN: [id number] DOB/AGE: 06/21/1960 55 y.o.  Admit date: 10/18/2015 Discharge date: 10/31/2015  Admission Diagnoses:  Patient Active Problem List   Diagnosis Date Noted  . Encounter for antineoplastic chemotherapy 10/05/2015  . Primary lung adenocarcinoma ([place]) 08/25/2015  . Osteoarthritis of right thumb 10/21/2014  . Dupuytren's contracture of both hands 04/08/2014  . Healthcare maintenance 10/08/2013  . Post traumatic stress disorder 07/02/2013  . Type 2 diabetes mellitus with vascular disease ([place]) 07/01/2013  . Peripheral vascular occlusive disease ([place]) 07/01/2013  . 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 Erectile dysfunction associated with type 2 diabetes mellitus ([place]) 07/01/2013  . Hyperlipidemia 07/01/2013  . Tobacco abuse 07/01/2013  . Fibromyalgia 07/01/2013  . Degenerative joint disease of cervical spine 07/01/2013  . Barrett's esophagus 07/01/2013  . Chronic pain syndrome 07/01/2013  . Severe major depression with psychotic features ([place]) 04/15/2011  . Cognitive disorder 04/15/2011  . Closed head injury with brief loss of consciousness ([place]) 08/10/2010   Discharge Diagnoses:   Patient Active Problem List   Diagnosis Date Noted  . S/P lobectomy of lung 10/18/2015  . Encounter for antineoplastic chemotherapy 10/05/2015  . Primary lung adenocarcinoma ([place]) 08/25/2015  . Osteoarthritis of right thumb 10/21/2014  . Dupuytren's contracture of both hands 04/08/2014  . Healthcare maintenance 10/08/2013  . Post traumatic stress disorder 07/02/2013  . Type 2 diabetes mellitus with vascular disease ([place]) 07/01/2013  . Peripheral vascular occlusive disease ([place]) 07/01/2013  . 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 Erectile dysfunction associated with type 2 diabetes mellitus ([place]) 07/01/2013  . Hyperlipidemia 07/01/2013  . Tobacco abuse  07/01/2013  . Fibromyalgia 07/01/2013  . Degenerative joint disease of cervical spine 07/01/2013  . Barrett's esophagus 07/01/2013  . Chronic pain syndrome 07/01/2013  . Severe major depression with psychotic features ([place]) 04/15/2011  . Cognitive disorder 04/15/2011  . Closed head injury with brief loss of consciousness ([place]) 08/10/2010   Discharged Condition: good  History of Present Illness:  Brett [name] is a 55 yo male with known medical history of tobacco abuse, Barrett's esophagus, PAD (carotid and femoral stents), history of CHI with cognitive impairment, fibromaylgia, chronic pain, major depression and type II diabetes without complication.  He recently injured his left shoulder trying to move something. Xrays were done which showed an abnormality in the right lung. A CT showed a right upper lobe mass. PET showed the mass was hypermetabolic. There was also a mildly hypermetabolic hilar node. Dr. [place] Bells did a bronchoscopy and biopsies showed a non-small cell carcinoma. He was referred to Dr. Roxan Hockey for surgical evaluation.  He continued to smoke, but is down to less than 1/2 ppd. He gets short of breath with heavy exertion but not routine activities. He has not had significant weight loss. No recent change in HA or vision. He has a frequent cough but denies hemoptysis and wheezing.  It was recommended the patient undergo lobectomy with a VATS procedure.  The risks and benefits of the procedure were explained to the patient and he was agreeable to proceed.  Hospital Course:   Brett [name] presented to [facility].  He was taken to the operating room and underwent Right VATS, right upper lobectomy, mediastinal lymph node dissection, and insertion of ON- Q pain catheter.  He tolerated the procedure without difficulty, was extubated, and taken to the PACU in stable condition.  The patients post operative  course progressed without much difficulty.  The patients chest tube  had an air leak initially and was kept on suction.  He has diabetes, his sugars were elevated during hospitalization and was restarted on home medications as sugars allow.  The patient's chest tube output remained low.  His posterior chest tube was removed on POD #2.  His chest tube continued to have an air leak, but his CXR was stable.  Therefore, he was transitioned to water seal until air leak resolved.  This occurred on POD #5 and his final chest tube was removed. Follow up chest x ray showed a larger pneumothorax. Dr. Roxan Hockey recommended a pigtail catheter be placed, but the patient did not want to do this. Ultimately, he required a chest tube and this was placed on 10/03 by Dr. Roxan Hockey. Follow up chest x ray remained stable and there was no pneumothorax. Chest tube did not have an air leak. It was placed to water seal on 10/04.  Chest x ray later that afternoon showed a small right pneumatothorax. Chest tube remained to water seal for a few days. He developed a small air leak. This did resolve and the chest tube was removed on 10/08. Follow up chest x ray showed re-accumulation of a 15-20% right-sided pneumothorax following removal of the small caliber chest tube. Chest x ray obtained 10/31/2015 showed stable appearance of pneumothorax. He was ambulating independently.  He was tolerating a diet with good blood sugar control on home medications.  His pain was well controlled.  He is felt medically stable for discharge home today.          Significant Diagnostic Studies:  EXAM: CHEST  2 VIEW  COMPARISON:  PA and lateral chest x-ray of October 29 2015.  FINDINGS: There has been interval reaccumulation of a 15-20% right-sided pneumothorax. The pleural line extends laterally and inferiorly and lies adjacent to suture material visible over the right lateral hemithorax. There is no significant pleural effusion. The left lung is clear. The heart and pulmonary vascularity are normal. There  is calcification in the wall of the aortic arch. The observed bony thorax exhibits no acute abnormality.  IMPRESSION: Re-accumulation of a 15-20% right-sided pneumothorax following removal of the small caliber chest tube. The remainder of the examination is stable. These results were called by telephone at the time of interpretation on 10/30/2015 at 7:51 am to Drema Dallas, RN,, who verbally acknowledged these results.  Aortic atherosclerosis.   Electronically Signed   By: David  Martinique M.D.   On: 10/30/2015 07:54  Treatments: surgery:    Right video-assisted thoracoscopy, thoracoscopic right upper lobectomy, mediastinal lymph node dissection, insertion of On-Q local anesthetic catheter.  Pathology: 1. Lung, resection (segmental or lobe), Right upper lobe - ADENOCARCINOMA, 2.1 CM. - MARGINS NOT INVOLVED. - ONE BENIGN LYMPH NODE (0/1). 2. Lymph node, biopsy, L 9 - ONE BENIGN LYMPH NODE (0/1). 3. Lymph node, biopsy, 10 R - ONE BENIGN LYMPH NODE (0/1). 4. Lymph node, biopsy, 10 R #2 - ONE BENIGN LYMPH NODE (0/1) 5. Lymph node, biopsy, 4 R - ONE BENIGN LYMPH NODE (0/1).  Disposition: 01-Home or Self Care   Discharge Medications:       Medication List    TAKE these medications   aspirin EC 81 MG tablet Take 81 mg by mouth daily.   atorvastatin 40 MG tablet Commonly known as:  LIPITOR Take 1 tablet (40 mg total) by mouth daily.   clopidogrel 75 MG tablet Commonly known as:  PLAVIX  Take 75 mg by mouth daily with breakfast.   glipiZIDE 10 MG tablet Commonly known as:  GLUCOTROL Take 2 tablets (20 mg total) by mouth 2 (two) times daily before a meal.   HYDROcodone-acetaminophen 10-325 MG tablet Commonly known as:  NORCO Take 1 tablet by mouth every 6 (six) hours as needed for severe pain.   lisinopril 5 MG tablet Commonly known as:  PRINIVIL,ZESTRIL Take 1 tablet (5 mg total) by mouth daily.   metFORMIN 500 MG tablet Commonly known as:   GLUCOPHAGE Take 1 tablet (500 mg total) by mouth 2 (two) times daily with a meal.   pantoprazole 40 MG tablet Commonly known as:  PROTONIX Take 1 tablet (40 mg total) by mouth daily.   pregabalin 50 MG capsule Commonly known as:  LYRICA Take 1 capsule (50 mg total) by mouth 3 (three) times daily. What changed:  when to take this   prochlorperazine 10 MG tablet Commonly known as:  COMPAZINE Take 1 tablet (10 mg total) by mouth every 6 (six) hours as needed for nausea or vomiting.   sitaGLIPtin 100 MG tablet Commonly known as:  JANUVIA Take 1 tablet (100 mg total) by mouth daily.      Follow-up Information    Melrose Nakayama, MD Follow up on 11/07/2015.   Specialty:  Cardiothoracic Surgery Why:  Appointment is at 1:00 pm. Please get CXR at 12:30 at [facility] located on first floor of our office building Contact information: [address] [place] [address] [phone]           Signed: Caryn Bee 10/31/2015, 8:32 AM

## 2015-10-23 NOTE — Discharge Instructions (Signed)
Thoracoscopic Surgery, Care After Refer to this sheet in the next few weeks. These instructions provide you with information on caring for yourself after your procedure. Your caregiver may also give you more specific instructions. Your procedure has been planned according to current medical practices, but problems sometimes occur. Call your caregiver if you have any problems or questions after your procedure. HOME CARE INSTRUCTIONS   Only take over-the-counter or prescription medications as directed.  Only take pain medications (narcotics) as directed.  Do not drive until your caregiver approves. Driving while taking narcotics or soon after surgery can be dangerous, so discuss the specific timing with your caregiver.  Avoid activities that use your chest muscles, such as lifting heavy objects, for at least 3-4 weeks.   Take deep breaths to expand the lungs and to protect against pneumonia.  Do breathing exercises as directed by your caregiver. If you were given an incentive spirometer to help with breathing, use it as directed.  You may resume a normal diet and activities when you feel you are able to or as directed.  Do not take a bath until your caregiver says it is OK. Use the shower instead.   Keep the bandage (dressing) covering the area where the chest tube was inserted (incision site) dry for 48 hours. After 48 hours, remove the dressing unless there is new drainage.  Remove dressings as directed by your caregiver.  Change dressings if necessary or as directed.  Keep all follow-up appointments. It is important for you to see your caregiver after surgery to discuss appropriate follow-up care and surveillance, if it is necessary. SEEK MEDICAL CARE:  You feel excessive or increasing pain at an incision site.  You notice bleeding, skin irritation, drainage, swelling, or redness at an incision site.  There is a bad smell coming from an incision or dressing.  It feels like your  heart is fluttering or beating rapidly.  Your pain medication does not relieve your pain. SEEK IMMEDIATE MEDICAL CARE IF:   You have a fever.   You have chest pain.  You have a rash.  You have shortness of breath.  You have trouble breathing.   You feel weak, lightheaded, dizzy, or faint.  MAKE SURE YOU:   Understand these instructions.   Will watch your condition.   Will get help right away if you are not doing well or get worse.   This information is not intended to replace advice given to you by your health care provider. Make sure you discuss any questions you have with your health care provider.   Document Released: 05/04/2012 Document Revised: 01/28/2014 Document Reviewed: 05/04/2012 Elsevier Interactive Patient Education [employer].

## 2015-10-23 NOTE — Progress Notes (Signed)
      [address]       [place] [address]             [phone]      CXR afeter CT removed shows a larger pneumo/ space He is asymptomatic and has good sats on RA Will observe and recheck a CXR in AM  Path - T1, N0- patient informed  Revonda Standard. Roxan Hockey, MD [facility] [phone]

## 2015-10-24 ENCOUNTER — Encounter (HOSPITAL_COMMUNITY): Payer: Self-pay | Admitting: Radiology

## 2015-10-24 ENCOUNTER — Inpatient Hospital Stay (HOSPITAL_COMMUNITY): Payer: Medicare Other

## 2015-10-24 LAB — GLUCOSE, CAPILLARY
Glucose-Capillary: 183 mg/dL — ABNORMAL HIGH (ref 65–99)
Glucose-Capillary: 84 mg/dL (ref 65–99)
Glucose-Capillary: 141 mg/dL — ABNORMAL HIGH (ref 65–99)
Glucose-Capillary: 77 mg/dL (ref 65–99)

## 2015-10-24 MED ORDER — LIDOCAINE HCL (PF) 1 % IJ SOLN
30.0000 mL | Freq: Once | INTRAMUSCULAR | Status: DC
  Filled 2015-10-24: qty 30

## 2015-10-24 NOTE — Progress Notes (Signed)
10/24/15  0735  Paged Dr Roxan Hockey at [phone]. Left message for MD regarding message received from Dr Martinique of patient increase pneumothorax.  Waiting for response.

## 2015-10-24 NOTE — Progress Notes (Signed)
6 Days Post-Op Procedure(s) (LRB): VIDEO ASSISTED THORACOSCOPY (VATS)/ LOBECTOMY (Right) Subjective: No complaints, denies SOB  Objective: Vital signs in last 24 hours: Temp:  [97.8 F (36.6 C)-98.5 F (36.9 C)] 97.8 F (36.6 C) (10/03 0315) Pulse Rate:  [54-93] 79 (10/03 0315) Cardiac Rhythm: Normal sinus rhythm (10/03 0700) Resp:  [16-24] 20 (10/03 0400) BP: (107-120)/(70-89) 111/76 (10/03 0315) SpO2:  [96 %-100 %] 98 % (10/03 0400)  Hemodynamic parameters for last 24 hours:    Intake/Output from previous day: 10/02 0701 - 10/03 0700 In: 960 [P.O.:960] Out: 400 [Urine:400] Intake/Output this shift: No intake/output data recorded.  General appearance: alert, cooperative and no distress Neurologic: intact Heart: regular rate and rhythm Lungs: diminished breath sounds on right Wound: clean and dry  Lab Results: No results for input(s): WBC, HGB, HCT, PLT in the last 72 hours. BMET: No results for input(s): NA, K, CL, CO2, GLUCOSE, BUN, CREATININE, CALCIUM in the last 72 hours.  PT/INR: No results for input(s): LABPROT, INR in the last 72 hours. ABG    Component Value Date/Time   PHART 7.362 10/19/2015 0430   HCO3 23.7 10/19/2015 0430   ACIDBASEDEF 0.9 10/19/2015 0430   O2SAT 97.6 10/19/2015 0430   CBG (last 3)   Recent Labs  10/23/15 1144 10/23/15 1627 10/23/15 2122  GLUCAP 138* 128* 113*    Assessment/Plan: S/P Procedure(s) (LRB): VIDEO ASSISTED THORACOSCOPY (VATS)/ LOBECTOMY (Right) -  S/p Right upper lobectomy CXR this AM shows a larger right pneumothorax. He is not symptomatic. I recommended placement of a pigtail catheter but he does not want to do that right now. He understands there is risk associated with the pneumo enlarging.  Will repeat a CXR this afternoon and place tube if any larger.   Re: Nursing note- I was not paged. The office was called and notified the MD on call  LOS: 6 days    Brett [name] 10/24/2015

## 2015-10-24 NOTE — Progress Notes (Signed)
      [address]       [place] [address]             [phone]      Mr. Hun still feels well.  BP (!) 105/92 (BP Location: Right Arm)   Pulse 76   Temp 97.3 F (36.3 C) (Oral)   Resp 18   Ht '5\' 7"'$  (1.702 m)   Wt 138 lb 0.1 oz (62.6 kg)   SpO2 100%   BMI 21.62 kg/m   sats are good on room air  I reviewed his CXR from this afternoon and in my opinion the pneumothorax is larger. He is not in any trouble now but is at risk if this progresses. I think the safest option is to go ahead and place a pigtail catheter. I informed him of the indications, risks, benefits, and alternatives.  He accepts the risks and agrees to proceed.  Revonda Standard Roxan Hockey, MD [facility] [phone]

## 2015-10-24 NOTE — Procedures (Signed)
Informed consent obtained Correct patient, procedure and side (right) confirmed Sterile technique Local anesthetic with 10 ml 1% lidocaine 14 F pigtail catheter placed right pleural space over a wire Some discomfort with wire in chest, otherwise tolerated well + air leak initially but minimal leak once placed to suction  Remo Lipps C. Roxan Hockey, MD [facility] [phone]

## 2015-10-24 NOTE — Progress Notes (Signed)
Called to pt room, pt stated "I'm bleeding", pt OBS to have frank red blood under CT dsg, on bed and skin, dsg reinforced, pt VSS without any distress, Rapid Response Nurse at [facility] to assess pt, stat portable CXR obtained  MD/Owen called and updated, instructed to hold pressure for 30 mis and cont to monitor pt  SWOT RN assisted with holding pressure for 30 mins  Nursing will cont to monitor

## 2015-10-25 ENCOUNTER — Inpatient Hospital Stay (HOSPITAL_COMMUNITY): Payer: Medicare Other

## 2015-10-25 LAB — GLUCOSE, CAPILLARY
Glucose-Capillary: 155 mg/dL — ABNORMAL HIGH (ref 65–99)
Glucose-Capillary: 123 mg/dL — ABNORMAL HIGH (ref 65–99)
Glucose-Capillary: 165 mg/dL — ABNORMAL HIGH (ref 65–99)
Glucose-Capillary: 198 mg/dL — ABNORMAL HIGH (ref 65–99)

## 2015-10-25 NOTE — Progress Notes (Signed)
Contacted via nursing that patient complained of sudden onset right sided shoulder pain and shortness of breath.  He was also tachycardic.  STAT was ordered and showed a re-developed 10% apical pneumothorax.  On Exam:  Patient is in no apparent distress CV- RRR, rate down in the 80s Resp; CTA  CXR: reviewed, minimal apical pneumothorax  A/P:  1. New 10% pneumothorax on right which is different than film from 12.  Patient is in no apparent distress... Will leave chest tube to water seal... Repeat CXR in AM

## 2015-10-25 NOTE — Progress Notes (Signed)
Pt called out for RN. RN found patient to have grimace, ST 9100-110's, and c/o SOB and pain at site of apical CT, O2 on RA 96%, RR 17. Pt noted to have a pleural rub and difficulty taking deep breaths. Cough showed no air leak at this time. Notified Erin, [place], new orders given.   Will continue to monitor for changes.

## 2015-10-25 NOTE — Progress Notes (Signed)
During rounds RN requested me to lok at pt due to new onset of frank red blood coming from chest tube site. RN had enforced area with a pack of 4x4 gauze, ABD pad, and cloth tape. Dressing intact, ABD clean and dry, blood noted to gauze. Pt alert and oriented, no signs of distress, VSS. CXR ordered by bedside RN.

## 2015-10-25 NOTE — Care Management Important Message (Signed)
Important Message  Patient Details  Name: Brett [name] MRN: [id number] Date of Birth: 23-Aug-1960   Medicare Important Message Given:  Yes    [name] 10/25/2015, 10:59 AM

## 2015-10-25 NOTE — Progress Notes (Signed)
      [address]       [place] [address]             [phone]      7 Days Post-Op Procedure(s) (LRB): VIDEO ASSISTED THORACOSCOPY (VATS)/ LOBECTOMY (Right)   Subjective:  Pain at chest tube site.  Objective: Vital signs in last 24 hours: Temp:  [97.3 F (36.3 C)-98.3 F (36.8 C)] 98.1 F (36.7 C) (10/04 0700) Pulse Rate:  [71-97] 97 (10/04 0716) Cardiac Rhythm: Normal sinus rhythm (10/04 0735) Resp:  [9-19] 9 (10/04 0716) BP: (103-128)/(74-92) 103/78 (10/04 0716) SpO2:  [96 %-100 %] 97 % (10/04 0716)  Intake/Output from previous day: 10/03 0701 - 10/04 0700 In: 480 [P.O.:480] Out: 900 [Urine:900]  General appearance: alert, cooperative and no distress Heart: regular rate and rhythm Lungs: clear to auscultation bilaterally Abdomen: soft, non-tender; bowel sounds normal; no masses,  no organomegaly Extremities: extremities normal, atraumatic, no cyanosis or edema Wound: clean and dry  Lab Results: No results for input(s): WBC, HGB, HCT, PLT in the last 72 hours. BMET: No results for input(s): NA, K, CL, CO2, GLUCOSE, BUN, CREATININE, CALCIUM in the last 72 hours.  PT/INR: No results for input(s): LABPROT, INR in the last 72 hours. ABG    Component Value Date/Time   PHART 7.362 10/19/2015 0430   HCO3 23.7 10/19/2015 0430   ACIDBASEDEF 0.9 10/19/2015 0430   O2SAT 97.6 10/19/2015 0430   CBG (last 3)   Recent Labs  10/24/15 1620 10/24/15 2108 10/25/15 0758  GLUCAP 141* 77 155*    Assessment/Plan: S/P Procedure(s) (LRB): VIDEO ASSISTED THORACOSCOPY (VATS)/ LOBECTOMY (Right)  1. S/P Pigtail placement yesterday for Right sided pneumothorax- CXR is free from pneumothorax this morning... No air leak.... Will place to water seal and repeat CXR at 12:00 2. Dispo- trial chest tube on water seal, repeat CXR at 12   LOS: 7 days    BARRETT, ERIN 10/25/2015

## 2015-10-26 ENCOUNTER — Inpatient Hospital Stay (HOSPITAL_COMMUNITY): Payer: Medicare Other

## 2015-10-26 LAB — GLUCOSE, CAPILLARY
Glucose-Capillary: 178 mg/dL — ABNORMAL HIGH (ref 65–99)
Glucose-Capillary: 140 mg/dL — ABNORMAL HIGH (ref 65–99)
Glucose-Capillary: 97 mg/dL (ref 65–99)
Glucose-Capillary: 196 mg/dL — ABNORMAL HIGH (ref 65–99)

## 2015-10-26 NOTE — Progress Notes (Addendum)
      [address]       [place] [address]             [phone]      8 Days Post-Op Procedure(s) (LRB): VIDEO ASSISTED THORACOSCOPY (VATS)/ LOBECTOMY (Right)   Subjective:  Mr. Nichols has no new complaints.  He wants to go home.    Objective: Vital signs in last 24 hours: Temp:  [97.5 F (36.4 C)-98.7 F (37.1 C)] 98 F (36.7 C) (10/05 0627) Pulse Rate:  [70-117] 70 (10/05 0627) Cardiac Rhythm: Normal sinus rhythm (10/05 0051) Resp:  [14-24] 14 (10/05 0627) BP: (94-105)/(65-73) 101/65 (10/05 0627) SpO2:  [96 %-100 %] 96 % (10/05 0627)  Intake/Output from previous day: 10/04 0701 - 10/05 0700 In: 1320 [P.O.:1320] Out: 1675 [Urine:1625; Chest Tube:50]  General appearance: alert, cooperative and no distress Heart: regular rate and rhythm Lungs: clear to auscultation bilaterally Wound: clean and dry  Lab Results: No results for input(s): WBC, HGB, HCT, PLT in the last 72 hours. BMET: No results for input(s): NA, K, CL, CO2, GLUCOSE, BUN, CREATININE, CALCIUM in the last 72 hours.  PT/INR: No results for input(s): LABPROT, INR in the last 72 hours. ABG    Component Value Date/Time   PHART 7.362 10/19/2015 0430   HCO3 23.7 10/19/2015 0430   ACIDBASEDEF 0.9 10/19/2015 0430   O2SAT 97.6 10/19/2015 0430   CBG (last 3)   Recent Labs  10/25/15 1211 10/25/15 1711 10/25/15 2128  GLUCAP 123* 165* 198*    Assessment/Plan: S/P Procedure(s) (LRB): VIDEO ASSISTED THORACOSCOPY (VATS)/ LOBECTOMY (Right)  1. Chest tube- on water seal... CXR with worsening pneumothorax at 10-20%. 2. Dispo- will plan to repeat CXR in AM... Initially was going to leave chest tube to water seal... However while typing note patient had strong cough, when I went into check on patient and air leak was present and I placed chest tube to suction.   LOS: 8 days    BARRETT, ERIN 10/26/2015 Patient seen and examined, agree with above He had a slightly larger pneumo on his AM  CXR. Had an air leak. After tube placed back on suction air leak resolved. He does not have one at present Will leave on suction overnight  Remo Lipps C. Roxan Hockey, MD [facility] [phone]

## 2015-10-27 ENCOUNTER — Inpatient Hospital Stay (HOSPITAL_COMMUNITY): Payer: Medicare Other

## 2015-10-27 LAB — GLUCOSE, CAPILLARY
Glucose-Capillary: 166 mg/dL — ABNORMAL HIGH (ref 65–99)
Glucose-Capillary: 98 mg/dL (ref 65–99)
Glucose-Capillary: 145 mg/dL — ABNORMAL HIGH (ref 65–99)
Glucose-Capillary: 105 mg/dL — ABNORMAL HIGH (ref 65–99)

## 2015-10-27 NOTE — Care Management Important Message (Signed)
Important Message  Patient Details  Name: Brett [name] MRN: [id number] Date of Birth: [birth year]   Medicare Important Message Given:  Yes    Merrilee Ancona Abena 10/27/2015, 1:13 PM

## 2015-10-27 NOTE — Progress Notes (Signed)
9 Days Post-Op Procedure(s) (LRB): VIDEO ASSISTED THORACOSCOPY (VATS)/ LOBECTOMY (Right) Subjective: Anxious to go home  Objective: Vital signs in last 24 hours: Temp:  [97.5 F (36.4 C)-98.1 F (36.7 C)] 97.7 F (36.5 C) (10/06 0700) Pulse Rate:  [67-90] 77 (10/06 0700) Cardiac Rhythm: Normal sinus rhythm (10/06 0729) Resp:  [13-28] 16 (10/06 0700) BP: (99-112)/(68-77) 99/70 (10/06 0700) SpO2:  [95 %-100 %] 95 % (10/06 0700)  Hemodynamic parameters for last 24 hours:    Intake/Output from previous day: 10/05 0701 - 10/06 0700 In: 2040 [P.O.:2040] Out: 2664 [Urine:2600; Chest Tube:64] Intake/Output this shift: Total I/O In: -  Out: 20 [Chest Tube:20]  General appearance: alert, cooperative and no distress Neurologic: intact Heart: regular rate and rhythm Lungs: clear to auscultation bilaterally Wound: clean and dry tiny air leak with about every other cough  Lab Results: No results for input(s): WBC, HGB, HCT, PLT in the last 72 hours. BMET: No results for input(s): NA, K, CL, CO2, GLUCOSE, BUN, CREATININE, CALCIUM in the last 72 hours.  PT/INR: No results for input(s): LABPROT, INR in the last 72 hours. ABG    Component Value Date/Time   PHART 7.362 10/19/2015 0430   HCO3 23.7 10/19/2015 0430   ACIDBASEDEF 0.9 10/19/2015 0430   O2SAT 97.6 10/19/2015 0430   CBG (last 3)   Recent Labs  10/26/15 1642 10/26/15 2112 10/27/15 0735  GLUCAP 97 196* 166*    Assessment/Plan: S/P Procedure(s) (LRB): VIDEO ASSISTED THORACOSCOPY (VATS)/ LOBECTOMY (Right) -Doing well overall Tiny air leak- will keep CT to suction today Continue ambulation   LOS: 9 days    Melrose Nakayama 10/27/2015

## 2015-10-28 LAB — GLUCOSE, CAPILLARY
Glucose-Capillary: 150 mg/dL — ABNORMAL HIGH (ref 65–99)
Glucose-Capillary: 88 mg/dL (ref 65–99)
Glucose-Capillary: 169 mg/dL — ABNORMAL HIGH (ref 65–99)
Glucose-Capillary: 100 mg/dL — ABNORMAL HIGH (ref 65–99)

## 2015-10-28 NOTE — Progress Notes (Signed)
      [address]       [place] [address]             [phone]      10 Days Post-Op Procedure(s) (LRB): VIDEO ASSISTED THORACOSCOPY (VATS)/ LOBECTOMY (Right) Subjective: Feels good and asking to go home.   Objective: Vital signs in last 24 hours: Temp:  [97.4 F (36.3 C)-98.5 F (36.9 C)] 97.4 F (36.3 C) (10/07 1223) Pulse Rate:  [71-91] 83 (10/07 1223) Cardiac Rhythm: Normal sinus rhythm (10/07 0820) Resp:  [15-21] 21 (10/07 1223) BP: (101-114)/(60-84) 102/74 (10/07 1223) SpO2:  [93 %-100 %] 100 % (10/07 1223)     Intake/Output from previous day: 10/06 0701 - 10/07 0700 In: 1040 [P.O.:1040] Out: 2465 [Urine:2425; Chest Tube:40] Intake/Output this shift: Total I/O In: 360 [P.O.:360] Out: 750 [Urine:750]  General appearance: alert, cooperative and no distress Heart: regular rate and rhythm, S1, S2 normal, no murmur, click, rub or gallop Lungs: clear to auscultation bilaterally, crackles in RLL.  Abdomen: soft, non-tender; bowel sounds normal; no masses,  no organomegaly Extremities: extremities normal, atraumatic, no cyanosis or edema Wound: clean and dry  Lab Results: No results for input(s): WBC, HGB, HCT, PLT in the last 72 hours. BMET: No results for input(s): NA, K, CL, CO2, GLUCOSE, BUN, CREATININE, CALCIUM in the last 72 hours.  PT/INR: No results for input(s): LABPROT, INR in the last 72 hours. ABG    Component Value Date/Time   PHART 7.362 10/19/2015 0430   HCO3 23.7 10/19/2015 0430   ACIDBASEDEF 0.9 10/19/2015 0430   O2SAT 97.6 10/19/2015 0430   CBG (last 3)   Recent Labs  10/27/15 2145 10/28/15 0815 10/28/15 1221  GLUCAP 105* 150* 88    Assessment/Plan: S/P Procedure(s) (LRB): VIDEO ASSISTED THORACOSCOPY (VATS)/ LOBECTOMY (Right)  Last CXR reviewed with no pneumo. No air leak today. Can change to waterseal and will obtain a chest xray. Continue ambulation. Encouraged IS hourly use.    LOS: 10 days    Elgie Collard 10/28/2015   No air leak today , ct to water seal  I have seen and examined Lynnae Prude and agree with the above assessment  and plan.  Grace Isaac MD Beeper [phone] Office [phone] 10/28/2015 2:21 PM

## 2015-10-29 ENCOUNTER — Inpatient Hospital Stay (HOSPITAL_COMMUNITY): Payer: Medicare Other

## 2015-10-29 LAB — GLUCOSE, CAPILLARY
Glucose-Capillary: 110 mg/dL — ABNORMAL HIGH (ref 65–99)
Glucose-Capillary: 116 mg/dL — ABNORMAL HIGH (ref 65–99)
Glucose-Capillary: 153 mg/dL — ABNORMAL HIGH (ref 65–99)
Glucose-Capillary: 94 mg/dL (ref 65–99)

## 2015-10-29 NOTE — Progress Notes (Signed)
      [address]       [place] [address]             [phone]      11 Days Post-Op Procedure(s) (LRB): VIDEO ASSISTED THORACOSCOPY (VATS)/ LOBECTOMY (Right) Subjective: Feels good and asking to go home  Objective: Vital signs in last 24 hours: Temp:  [97.4 F (36.3 C)-98 F (36.7 C)] 97.5 F (36.4 C) (10/08 0715) Pulse Rate:  [82-101] 83 (10/08 0715) Cardiac Rhythm: Normal sinus rhythm (10/08 0954) Resp:  [16-25] 16 (10/08 0715) BP: (97-111)/(69-74) 111/72 (10/08 0715) SpO2:  [94 %-100 %] 100 % (10/08 0715)     Intake/Output from previous day: 10/07 0701 - 10/08 0700 In: 1890 [P.O.:480; I.V.:1410] Out: 2427 [Urine:2426; Stool:1] Intake/Output this shift: No intake/output data recorded.  General appearance: alert, cooperative and no distress Heart: regular rate and rhythm, S1, S2 normal, no murmur, click, rub or gallop Lungs: clear to auscultation bilaterally, crackles in RLL Abdomen: soft, non-tender; bowel sounds normal; no masses,  no organomegaly Extremities: extremities normal, atraumatic, no cyanosis or edema Wound: clean and dry  Lab Results: No results for input(s): WBC, HGB, HCT, PLT in the last 72 hours. BMET: No results for input(s): NA, K, CL, CO2, GLUCOSE, BUN, CREATININE, CALCIUM in the last 72 hours.  PT/INR: No results for input(s): LABPROT, INR in the last 72 hours. ABG    Component Value Date/Time   PHART 7.362 10/19/2015 0430   HCO3 23.7 10/19/2015 0430   ACIDBASEDEF 0.9 10/19/2015 0430   O2SAT 97.6 10/19/2015 0430   CBG (last 3)   Recent Labs  10/28/15 1648 10/28/15 2208 10/29/15 0815  GLUCAP 169* 100* 110*    Assessment/Plan: S/P Procedure(s) (LRB): VIDEO ASSISTED THORACOSCOPY (VATS)/ LOBECTOMY (Right)  This mornings CXR with no pneumothorax with chest tube on waterseal.  Positive fluid wave but no air leak. Will discontinue chest tube today if okay with attending. Continue ambulation. Encouraged IS hourly  use.    LOS: 11 days    Brett [name] 10/29/2015   Chest tube out today , follow up xray in am I have seen and examined Brett [name] and agree with the above assessment  and plan.  Grace Isaac MD Beeper [phone] Office [phone] 10/29/2015 12:32 PM

## 2015-10-30 ENCOUNTER — Inpatient Hospital Stay (HOSPITAL_COMMUNITY): Payer: Medicare Other

## 2015-10-30 LAB — GLUCOSE, CAPILLARY
Glucose-Capillary: 151 mg/dL — ABNORMAL HIGH (ref 65–99)
Glucose-Capillary: 135 mg/dL — ABNORMAL HIGH (ref 65–99)
Glucose-Capillary: 150 mg/dL — ABNORMAL HIGH (ref 65–99)
Glucose-Capillary: 128 mg/dL — ABNORMAL HIGH (ref 65–99)

## 2015-10-30 NOTE — Progress Notes (Addendum)
      [address]       [place] [address]             [phone]       12 Days Post-Op Procedure(s) (LRB): VIDEO ASSISTED THORACOSCOPY (VATS)/ LOBECTOMY (Right)  Subjective: Patient states no change in his breathing. He was hoping to go home today.  Objective: Vital signs in last 24 hours: Temp:  [97.9 F (36.6 C)-98.6 F (37 C)] 98.6 F (37 C) (10/09 0428) Pulse Rate:  [74-99] 76 (10/09 0428) Cardiac Rhythm: Normal sinus rhythm (10/08 2001) Resp:  [12-21] 18 (10/09 0428) BP: (83-108)/(65-71) 98/70 (10/09 0428) SpO2:  [97 %-100 %] 98 % (10/09 0428)      Intake/Output from previous day: 10/08 0701 - 10/09 0700 In: -  Out: 1450 [Urine:1450]   Physical Exam:  Cardiovascular: RRR Pulmonary: Clear to auscultation on left and diminished apex on right Extremities: No lower extremity edema. Wounds: Clean and dry.  No erythema or signs of infection.  Lab Results: CBC:No results for input(s): WBC, HGB, HCT, PLT in the last 72 hours. BMET: No results for input(s): NA, K, CL, CO2, GLUCOSE, BUN, CREATININE, CALCIUM in the last 72 hours.  PT/INR: No results for input(s): LABPROT, INR in the last 72 hours. ABG:  INR: Will add last result for INR, ABG once components are confirmed Will add last 4 CBG results once components are confirmed  Assessment/Plan:  1. CV - SR in the 70-80's. 2.  Pulmonary - On room air. CXR this am shows re accumulation of right pneumothorax 15-20%. Is clinically stable at this time. As discussed with surgeon, no chest tube required at this time. Will monitor for now. Check CXR in am.   ZIMMERMAN,DONIELLE MPA-C 10/30/2015,8:01 AM  Chart reviewed, patient examined, agree with above. His CXR shows a small recurrent ptx but I would not do anything about it at this time. He is asymptomatic and breath sounds are good. Will repeat CXR in the am and if unchanged he can go home.

## 2015-10-31 ENCOUNTER — Inpatient Hospital Stay (HOSPITAL_COMMUNITY): Payer: Medicare Other

## 2015-10-31 LAB — GLUCOSE, CAPILLARY: Glucose-Capillary: 165 mg/dL — ABNORMAL HIGH (ref 65–99)

## 2015-10-31 NOTE — Care Management Important Message (Signed)
Important Message  Patient Details  Name: Brett [name] MRN: [id number] Date of Birth: 11-20-1960   Medicare Important Message Given:  Yes    [name] 10/31/2015, 9:05 AM

## 2015-10-31 NOTE — Care Management Note (Signed)
Case Management Note  Patient Details  Name: Brett [name] MRN: [id number] Date of Birth: 16-Aug-1960  Subjective/Objective:      S/P Vats , lobectomy, has chest tubes x 2, air leak, pca, He lives with wife at home, pta indep. He has medication coverage, he has a PCP, and he has transportation at dc. Patient is for dc today.              Action/Plan:   Expected Discharge Date:                  Expected Discharge Plan:  Home/Self Care  In-House Referral:     Discharge planning Services  CM Consult  Post Acute Care Choice:    Choice offered to:     DME Arranged:    DME Agency:     HH Arranged:    HH Agency:     Status of Service:  Completed, signed off  If discussed at [employer] of Stay Meetings, dates discussed:    Additional Comments:  [name], RN 10/31/2015, 9:13 AM

## 2015-10-31 NOTE — Progress Notes (Addendum)
      [address]       [place] [address]             [phone]      13 Days Post-Op Procedure(s) (LRB): VIDEO ASSISTED THORACOSCOPY (VATS)/ LOBECTOMY (Right)   Subjective:  Mr. Nickless has no complaints.  He feels pretty good and wants to go home.  Objective: Vital signs in last 24 hours: Temp:  [98 F (36.7 C)-98.7 F (37.1 C)] 98 F (36.7 C) (10/10 0324) Pulse Rate:  [80-113] 80 (10/10 0324) Cardiac Rhythm: Normal sinus rhythm (10/10 0701) Resp:  [13-22] 19 (10/10 0324) BP: (91-102)/(65-69) 101/65 (10/10 0324) SpO2:  [97 %-100 %] 99 % (10/10 0324)  Intake/Output from previous day: 10/09 0701 - 10/10 0700 In: 360 [P.O.:360] Out: 650 [Urine:650]  General appearance: alert, cooperative and no distress Heart: regular rate and rhythm Lungs: clear to auscultation bilaterally Abdomen: soft, non-tender; bowel sounds normal; no masses,  no organomegaly Wound: clean and dry  Lab Results: No results for input(s): WBC, HGB, HCT, PLT in the last 72 hours. BMET: No results for input(s): NA, K, CL, CO2, GLUCOSE, BUN, CREATININE, CALCIUM in the last 72 hours.  PT/INR: No results for input(s): LABPROT, INR in the last 72 hours. ABG    Component Value Date/Time   PHART 7.362 10/19/2015 0430   HCO3 23.7 10/19/2015 0430   ACIDBASEDEF 0.9 10/19/2015 0430   O2SAT 97.6 10/19/2015 0430   CBG (last 3)   Recent Labs  10/30/15 1713 10/30/15 2116 10/31/15 0820  GLUCAP 150* 128* 165*    Assessment/Plan: S/P Procedure(s) (LRB): VIDEO ASSISTED THORACOSCOPY (VATS)/ LOBECTOMY (Right)  1. Pneumothorax- 15-20% on CXR yesterday- repeat film appears unchanged to maybe a little improved.  No chest tube indicated at this time 2. Dispo- patient stable, will plan to discharge home today if okay with Dr. Roxan Hockey   LOS: 13 days    Ellwood Handler 10/31/2015 Patient seen and examined, agree with above CXR is stable to slightly better  Dc home today  Remo Lipps C.  Roxan Hockey, MD [facility] [phone]

## 2015-10-31 NOTE — Progress Notes (Signed)
Received call from Radiology with chest x-ray results.  Called results to Dr. Roxan Hockey who said ok to discharge patient.

## 2015-10-31 NOTE — Care Management Note (Signed)
Case Management Note  Patient Details  Name: Brett [name] MRN: [id number] Date of Birth: 1960/04/03  Subjective/Objective:          S/P Vats , lobectomy, has chest tubes x 2, air leak, pca, He lives with wife at home, pta indep. He has medication coverage, he has a PCP, and he has transportation at dc, Patient is for dc today. He has no needs.          Action/Plan:   Expected Discharge Date:                  Expected Discharge Plan:  Home/Self Care  In-House Referral:     Discharge planning Services  CM Consult  Post Acute Care Choice:    Choice offered to:     DME Arranged:    DME Agency:     HH Arranged:    HH Agency:     Status of Service:  Completed, signed off  If discussed at [employer] of Stay Meetings, dates discussed:    Additional Comments:  [name], RN 10/31/2015, 10:21 AM

## 2015-11-03 ENCOUNTER — Encounter: Payer: Self-pay | Admitting: *Deleted

## 2015-11-06 ENCOUNTER — Other Ambulatory Visit: Payer: Self-pay | Admitting: Thoracic Surgery (Cardiothoracic Vascular Surgery)

## 2015-11-06 DIAGNOSIS — C3490 Malignant neoplasm of unspecified part of unspecified bronchus or lung: Secondary | ICD-10-CM

## 2015-11-07 ENCOUNTER — Encounter: Payer: Self-pay | Admitting: Thoracic Surgery (Cardiothoracic Vascular Surgery)

## 2015-11-07 ENCOUNTER — Ambulatory Visit (INDEPENDENT_AMBULATORY_CARE_PROVIDER_SITE_OTHER): Payer: Self-pay | Admitting: Thoracic Surgery (Cardiothoracic Vascular Surgery)

## 2015-11-07 ENCOUNTER — Ambulatory Visit
Admission: RE | Admit: 2015-11-07 | Discharge: 2015-11-07 | Disposition: A | Discharge status: Home / Self Care | Payer: Medicare Other | Source: Ambulatory Visit | Attending: Thoracic Surgery (Cardiothoracic Vascular Surgery) | Admitting: Thoracic Surgery (Cardiothoracic Vascular Surgery)

## 2015-11-07 VITALS — BP 104/74 | HR 96 | Resp 16 | Ht 67.0 in | Wt 133.0 lb

## 2015-11-07 DIAGNOSIS — R0602 Shortness of breath: Secondary | ICD-10-CM | POA: Diagnosis not present

## 2015-11-07 DIAGNOSIS — C3411 Malignant neoplasm of upper lobe, right bronchus or lung: Secondary | ICD-10-CM

## 2015-11-07 DIAGNOSIS — C3490 Malignant neoplasm of unspecified part of unspecified bronchus or lung: Secondary | ICD-10-CM

## 2015-11-07 DIAGNOSIS — Z902 Acquired absence of lung [part of]: Secondary | ICD-10-CM

## 2015-11-07 NOTE — Progress Notes (Signed)
[facility] [address]       [name],Brett [name]             [phone]       HPI: Brett [name] returns today for scheduled follow-up visit.  He is a 55 year old man with history of tobacco abuse and COPD who had a thoracoscopic right upper lobectomy for a T1b, N0, stage IA non-small cell carcinoma on 10/18/2015. Postoperative course was complicated by a tiny bit persistent air leak. He finally went home on postop day 13.  Since discharge she's been doing well. He says that he'll have occasional episodes refill short of breath for a couple of seconds and then or resolve spontaneously. He denies any wheezing. He does have some incisional pain. He is on hydrocodone chronically and has been using that a couple of times a day.  Past Medical History:  Diagnosis Date  . Barrett's esophagus 07/01/2013   Without dysplasia on biopsy 09/03/2012. Repeat EGD recommended 08/2015  . Carotid artery stenosis 07/01/2013   Requiring right sided stent   . Chronic pain syndrome 07/01/2013  . Closed head injury with brief loss of consciousness ([place]) 07/22/2010   Head trapped in a hydraulic device at work.  Fracture of orbital bones on right and brief loss of consciousness per report.  . 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 There was likely a strong psycho-social overlay in regard to the cognitive deficits in the form of mood disorder with psychotic features and mixed anxiety symptomatology. His primary tested cognitive deficits are in the areas of attention, executi  . Degenerative joint disease of cervical spine 07/01/2013  . Dupuytren's contracture of both hands 04/08/2014  . Encounter for antineoplastic chemotherapy 10/05/2015  . Erectile dysfunction associated with type 2 diabetes mellitus ([place]) 07/01/2013  . Fibromyalgia 07/01/2013  . Headache    constantly    . Hyperlipidemia LDL goal < 100 07/01/2013  . Memory changes    "memory issues" from head injury  . Osteoarthritis of right thumb 10/21/2014  . Peripheral vascular occlusive disease ([place]) 07/01/2013   Requiring 2 arterial stents above the left knee per report  . Post traumatic stress disorder 07/01/2013  . Primary lung adenocarcinoma ([place])   . Severe major depression with psychotic features ([place]) 04/15/2011  . Tobacco abuse 07/01/2013  . Tobacco abuse   . Type 2 diabetes mellitus with vascular disease ([place]) 07/01/2013   Left lower extremity and right carotid stenting     Current Outpatient Prescriptions  Medication Sig Dispense Refill  . aspirin EC 81 MG tablet Take 81 mg by mouth daily.    Marland Kitchen atorvastatin (LIPITOR) 40 MG tablet Take 1 tablet (40 mg total) by mouth daily. 90 tablet 3  . clopidogrel (PLAVIX) 75 MG tablet Take 75 mg by mouth daily with breakfast.    . glipiZIDE (GLUCOTROL) 10 MG tablet Take 2 tablets (20 mg total) by mouth 2 (two) times daily before a meal. 360 tablet 3  . HYDROcodone-acetaminophen (NORCO) 10-325 MG tablet Take 1 tablet by mouth every 6 (six) hours as needed for severe pain. 90 tablet 0  . lisinopril (PRINIVIL,ZESTRIL) 5 MG tablet Take 1 tablet (5 mg total) by mouth daily. 90 tablet 3  . metFORMIN (GLUCOPHAGE) 500 MG tablet Take 1 tablet (500 mg total) by mouth 2 (two) times daily with a meal. 180 tablet 3  .  pantoprazole (PROTONIX) 40 MG tablet Take 1 tablet (40 mg total) by mouth daily. 90 tablet 3  . pregabalin (LYRICA) 50 MG capsule Take 1 capsule (50 mg total) by mouth 3 (three) times daily. (Patient taking differently: Take 50 mg by mouth 2 (two) times daily. ) 90 capsule 11  . sitaGLIPtin (JANUVIA) 100 MG tablet Take 1 tablet (100 mg total) by mouth daily. 90 tablet 3  . prochlorperazine (COMPAZINE) 10 MG tablet Take 1 tablet (10 mg total) by mouth every 6 (six) hours as needed for nausea or vomiting. (Patient not taking: Reported on 11/07/2015) 30 tablet  0   No current facility-administered medications for this visit.     Physical Exam BP 104/74   Pulse 96   Resp 16   Ht '5\' 7"'$  (1.702 m)   Wt 133 lb (60.3 kg)   SpO2 98% Comment: ON RA  BMI 20.50 kg/m  55 year old man in no acute distress, but anxious Alert and oriented 3 with no focal deficits Lungs with diminished breath sounds right base, otherwise clear Incisions healing well Cardiac regular rate and rhythm No peripheral edema  Diagnostic Tests: CHEST  2 VIEW  COMPARISON:  October 31, 2015  FINDINGS: Small right apical pneumothorax is again noted without apparent change. No tension component. There is postoperative change on the right, stable. No edema or consolidation is evident. No well-defined mass seen. No adenopathy. No bone lesions.  IMPRESSION: Stable small pneumothorax on the right. Postoperative change on the right with areas of mild volume loss. No edema or consolidation. Stable cardiac silhouette. No adenopathy or mass demonstrable.   Electronically Signed   By: Lowella Grip III M.D.   On: 11/07/2015 12:52 I personally reviewed the chest x-ray and concur with the findings noted above  Impression: 55 year old man is now about 3 weeks out from a thoracoscopic right upper lobectomy. He turned out to have stage IA non-small cell carcinoma (pathologic downstage from clinical stage IIA based on PET). He had a prolonged air leak postoperatively but eventually was discharged and has been doing well since then.  He has an appointment to see Dr. Julien [name] in November. He should not need any adjuvant treatment. He will need continued follow-up.  Tobacco abuse- he says he is no longer smoking and his wife concurs. I emphasized the importance of continued abstinence from tobacco for both short and long-term health reasons.  Plan: Follow-up is scheduled with Dr. Julien [name]  I will see him back in 2 months with a chest x-ray to check on his progress  Brett Nakayama, MD [facility] [phone]

## 2015-11-14 ENCOUNTER — Encounter (HOSPITAL_COMMUNITY): Payer: Self-pay

## 2015-11-23 ENCOUNTER — Telehealth: Payer: Self-pay | Admitting: Internal Medicine

## 2015-11-23 NOTE — Telephone Encounter (Signed)
APT. REMINDER CALL, LMTCB °

## 2015-11-24 ENCOUNTER — Ambulatory Visit (INDEPENDENT_AMBULATORY_CARE_PROVIDER_SITE_OTHER): Payer: Medicare Other | Admitting: Internal Medicine

## 2015-11-24 ENCOUNTER — Ambulatory Visit (HOSPITAL_COMMUNITY)
Admission: RE | Admit: 2015-11-24 | Discharge: 2015-11-24 | Disposition: A | Discharge status: Home / Self Care | Payer: Medicare Other | Source: Ambulatory Visit | Attending: Internal Medicine | Admitting: Internal Medicine

## 2015-11-24 ENCOUNTER — Encounter: Payer: Self-pay | Admitting: Internal Medicine

## 2015-11-24 ENCOUNTER — Encounter (HOSPITAL_COMMUNITY): Payer: Self-pay

## 2015-11-24 VITALS — BP 94/68 | HR 104 | Temp 97.7°F | Wt 132.3 lb

## 2015-11-24 DIAGNOSIS — C3491 Malignant neoplasm of unspecified part of right bronchus or lung: Secondary | ICD-10-CM

## 2015-11-24 DIAGNOSIS — C3411 Malignant neoplasm of upper lobe, right bronchus or lung: Secondary | ICD-10-CM

## 2015-11-24 DIAGNOSIS — F410 Panic disorder [episodic paroxysmal anxiety] without agoraphobia: Secondary | ICD-10-CM

## 2015-11-24 DIAGNOSIS — R110 Nausea: Secondary | ICD-10-CM | POA: Diagnosis not present

## 2015-11-24 DIAGNOSIS — E1159 Type 2 diabetes mellitus with other circulatory complications: Secondary | ICD-10-CM

## 2015-11-24 DIAGNOSIS — G894 Chronic pain syndrome: Secondary | ICD-10-CM

## 2015-11-24 DIAGNOSIS — Z7984 Long term (current) use of oral hypoglycemic drugs: Secondary | ICD-10-CM | POA: Diagnosis not present

## 2015-11-24 DIAGNOSIS — R05 Cough: Secondary | ICD-10-CM | POA: Diagnosis not present

## 2015-11-24 DIAGNOSIS — R0602 Shortness of breath: Secondary | ICD-10-CM | POA: Diagnosis not present

## 2015-11-24 DIAGNOSIS — Z902 Acquired absence of lung [part of]: Secondary | ICD-10-CM

## 2015-11-24 DIAGNOSIS — Z79891 Long term (current) use of opiate analgesic: Secondary | ICD-10-CM

## 2015-11-24 DIAGNOSIS — J90 Pleural effusion, not elsewhere classified: Secondary | ICD-10-CM | POA: Insufficient documentation

## 2015-11-24 DIAGNOSIS — F17211 Nicotine dependence, cigarettes, in remission: Secondary | ICD-10-CM

## 2015-11-24 DIAGNOSIS — Z0000 Encounter for general adult medical examination without abnormal findings: Secondary | ICD-10-CM

## 2015-11-24 DIAGNOSIS — Z7982 Long term (current) use of aspirin: Secondary | ICD-10-CM

## 2015-11-24 DIAGNOSIS — Z79899 Other long term (current) drug therapy: Secondary | ICD-10-CM

## 2015-11-24 LAB — BASIC METABOLIC PANEL
Anion gap: 10 (ref 5–15)
BUN: 13 mg/dL (ref 6–20)
CO2: 23 mmol/L (ref 22–32)
Calcium: 9.2 mg/dL (ref 8.9–10.3)
Chloride: 102 mmol/L (ref 101–111)
Creatinine, Ser: 0.83 mg/dL (ref 0.61–1.24)
GFR calc Af Amer: >60 mL/min (ref >60)
GFR calc non Af Amer: >60 mL/min (ref >60)
Glucose, Bld: 220 mg/dL — ABNORMAL HIGH (ref 65–99)
Potassium: 4.7 mmol/L (ref 3.5–5.1)
Sodium: 135 mmol/L (ref 135–145)

## 2015-11-24 LAB — POCT GLYCOSYLATED HEMOGLOBIN (HGB A1C): Hemoglobin A1C: 6.4

## 2015-11-24 LAB — GLUCOSE, CAPILLARY: Glucose-Capillary: 179 mg/dL — ABNORMAL HIGH (ref 65–99)

## 2015-11-24 MED ORDER — ATORVASTATIN CALCIUM 40 MG PO TABS: 40.0000 mg | ORAL_TABLET | Freq: Every day | ORAL | 3 refills | Status: DC

## 2015-11-24 MED ORDER — HYDROCODONE-ACETAMINOPHEN 10-325 MG PO TABS: 1.0000 | ORAL_TABLET | Freq: Four times a day (QID) | ORAL | 0 refills | Status: DC | PRN

## 2015-11-24 MED ORDER — IOPAMIDOL (ISOVUE-370) INJECTION 76%
INTRAVENOUS | Status: AC
  Administered 2015-11-24: 100 mL
  Filled 2015-11-24: qty 100

## 2015-11-24 MED ORDER — SERTRALINE HCL 25 MG PO TABS: 25.0000 mg | ORAL_TABLET | Freq: Every day | ORAL | 1 refills | Status: DC

## 2015-11-24 NOTE — Assessment & Plan Note (Signed)
Assessment  He has continued with tobacco cessation since his right upper lobe lobectomy for an adenocarcinoma. He was praised for remaining off of cigarettes and encouraged to continue so lifelong given his recent cancer diagnosis.  Plan  We will reassess his ability to remain off of the cigarettes at the follow-up visit.

## 2015-11-24 NOTE — Assessment & Plan Note (Signed)
Assessment  His chronic pain syndrome that includes headaches, back pain, and leg pain are relatively unchanged from baseline. He did not notice any improvement in his left lower leg pain with the Lyrica. He therefore stop this abruptly. He does get some relief with the hydrocodone-acetaminophen 10-325 mg 1 tablet every 6 hours as needed for pain dispense #90.  Plan  We will continue with the hydrocodone-acetaminophen 10-325 mg 1 tablet every 6 hours as needed for pain dispense #90 and reassess his pain control and functional status at the follow-up visit.

## 2015-11-24 NOTE — Assessment & Plan Note (Addendum)
Assessment  Mr. Norenberg presented with a one and a half week history of progressive and worsening episodes of dyspnea there were acute in onset and offset and lasted only minutes. These began after stopping his Lyrica abruptly. Abrupt Lyrica withdrawal can cause anxiety. That being said, given his recent surgery for lung cancer, his prolonged hospitalization and decreased mobility, acute dyspnea, and tachycardia on initial presentation there were several items in the differential that required immediate ruling out. A stat PA and lateral chest x-ray revealed no evidence of infiltrate or right-sided pneumothorax. In fact, the mild pneumothorax that was seen at follow-up in thoracic surgery had resolved. Given his high pretest probability for a pulmonary embolism he underwent a CT angiogram that was negative for evidence of pulmonary thromboembolism or other explanation for his acute dyspnea. History was not consistent with a cardiac cause such as a tachycardia, bradycardia, or acute ischemia. He was on no new medications that could cause shortness of breath. While getting his blood drawn he had an acute episode of dyspnea, diaphoresis, and became acutely pale. He was assured that everything was okay and within 2-3 minutes the symptoms resolved and he was calmly sitting in a recliner. Given that the serious items on the differential diagnosis have been ruled out and his history as well as the demonstrated episode in the phlebotomy lab were reviewed, these episodes are consistent with a panic disorder that may have been exacerbated by abrupt Lyrica withdrawal. Note, that this is a diagnosis of exclusion in the subacute operative setting that was complicated by a persistent air leak after a right upper lobectomy for adenocarcinoma.  Plan  I discussed my concerns about the etiology of these symptoms with the patient and his wife. We all agreed to give sertraline 25 mg by mouth every morning a try in hopes of blunting  these symptoms. He generally is not interested in taking medications, but I reassured him that this would be a short course to get him over these acute symptoms. The dose of the sertraline is very low and I suspect if he were to suddenly stop it he would not experience withdrawal symptoms but I instructed him how to taper the sertraline were he to decide to come off of it. Specifically, I asked him to take 25 mg every other day for 1 week before stopping. He was asked to call the clinic for a return appointment should the symptoms continue so he could be reevaluated. Otherwise, he will be followed up on 03/15/2016 with me and we can begin the sertraline taper at that time if these attacks have resolved.

## 2015-11-24 NOTE — Assessment & Plan Note (Signed)
Once he recovers from his acute postoperative status we will offer him a Prevnar 13 to begin the pneumococcal sequence given his underlying diabetes.

## 2015-11-24 NOTE — Assessment & Plan Note (Signed)
Assessment  Despite recently undergoing a right upper lobe lobectomy with a complicated perioperative course his diabetic control was the best ever today with a hemoglobin A1c of 6.4. This is on glipizide 20 mg by mouth twice daily, metformin 500 mg by mouth twice daily, and Januvia 100 mg by mouth daily. He has had no signs or symptoms worrisome for acute vascular compromise while on the high intensity statin, aspirin 81 mg by mouth daily, and lisinopril 5 mg by mouth daily.  Plan  He was praised for his outstanding glucose control. This demonstrates that his diabetes can be controlled if he is compliant with his medication. I am sure his inpatient stay helped assure compliance that he generally is unable to achieve at home. We will reassess his diabetic control at the follow-up visit and if it is still excellent will consider tapering the glipizide dose. At the follow-up visit we will discuss the need for a repeat ophthalmologic examination.

## 2015-11-24 NOTE — Progress Notes (Signed)
   Subjective:    Patient ID: Brett [name], male    DOB: [birth year], 55 y.o.   MRN: [id number]  HPI  [name] is here for acute dyspnea. Please see the A&P for the status of the pt's chronic medical problems.  Mr. Stingley recently underwent a right upper lobe lobectomy for a 2.1 cm adenocarcinoma. The pathologic staging was T1b N0 M0 for a stage IA lung adenocarcinoma. His postoperative course was complicated by prolonged airleak that eventually resolved. He was discharged home and over the last 1-1/2 weeks he's had progressive episodes of acute dyspnea that would last only a few minutes and then resolve spontaneously. In the interim of these episodes he felt near his postoperative baseline. His wife noted a fever to 100.4 yesterday, but he has had no fever since or before. He's had a nonproductive cough but denies significant chest pain other than around the incision sites on his right chest. He denies any palpitations. Of note, he abruptly stopped Lyrica approximately 1-1/2 weeks ago after which these symptoms began. This morning, the episodes of dyspnea were the worst yet. Fortunately he had made an appointment to be seen in the [place]. He admits to being nervous about his long given the prolonged postoperative airleak. Otherwise, he is without acute complaints.  Review of Systems  Constitutional: Positive for fever. Negative for activity change, appetite change and unexpected weight change.  HENT: Positive for congestion and rhinorrhea.   Respiratory: Positive for cough, chest tightness and shortness of breath. Negative for wheezing and stridor.   Cardiovascular: Positive for chest pain. Negative for palpitations and leg swelling.  Gastrointestinal: Positive for nausea. Negative for abdominal pain and vomiting.  Endocrine: Positive for cold intolerance.  Musculoskeletal: Positive for arthralgias, back pain, myalgias and neck pain. Negative for joint swelling.   Musculoskeletal symptoms are chronic.  Skin: Positive for pallor. Negative for color change, rash and wound.  Psychiatric/Behavioral: Positive for dysphoric mood and sleep disturbance. The patient is nervous/anxious.       Objective:   Physical Exam  Constitutional: He is oriented to person, place, and time. He appears well-developed and well-nourished.  HENT:  Head: Normocephalic and atraumatic.  Eyes: Conjunctivae are normal. Right eye exhibits no discharge. Left eye exhibits no discharge. No scleral icterus.  Neck: Normal range of motion. Neck supple. No JVD present. No tracheal deviation present.  Cardiovascular: Regular rhythm and normal heart sounds.  Exam reveals no gallop and no friction rub.   No murmur heard. Pulmonary/Chest: Breath sounds normal. No stridor. He is in respiratory distress. He has no wheezes. He has no rales. He exhibits no tenderness.  Abdominal: Soft. Bowel sounds are normal. He exhibits no distension. There is no tenderness. There is no rebound and no guarding.  Musculoskeletal: Normal range of motion. He exhibits no edema, tenderness or deformity.  Neurological: He is alert and oriented to person, place, and time. He exhibits normal muscle tone.  Skin: Skin is warm and dry. No rash noted. No erythema. There is pallor.  Became acutely pale, diaphoretic, and dyspneic while getting his blood drawn  Nursing note and vitals reviewed.     Assessment & Plan:   Please see problem oriented charting.

## 2015-11-24 NOTE — Assessment & Plan Note (Signed)
Assessment  He recently underwent a right upper lobe lobectomy for a stage IA TIb N0 M0 adenocarcinoma of the lung. He is to follow-up with Dr. Earlie Server in the [facility] and this is scheduled for November 13. At that appointment adjuvant chemotherapy will likely be discussed. Given the stage IA on pathology it is likely he will not require adjuvant therapy.  Plan  He was encouraged to follow-up with Dr. Earlie Server on November 13 as scheduled.

## 2015-11-24 NOTE — Patient Instructions (Signed)
It was good to see you again.  You have done a great job getting through the last month.  1) Keep taking the medications as you are.  2) Start sertraline 25 mg daily for the episodes of shortness of breath.  If you decide to stop this medication take 1 tablet every other day for 1 week and then stop.  3) Follow-up with Dr. Inda Merlin and Roxan Hockey.  4) I will see you on March 15, 2016, sooner if necessary.

## 2015-11-27 NOTE — Progress Notes (Signed)
Patient ID: Brett [name], male   DOB: 1960/04/17, 55 y.o.   MRN: [id number]   Received a call from Ms. Linderman.  Mr. Aleman had a few more episodes this weekend.  He now apparently is having nausea more than on Friday and therefore has taken compazine.  I asked that he take an over the counter antinausea medication, stop his glipizide in case this is hypoglycemia, take his sugars every day off of the glipizide, check a sugar if he has another spell, think really hard about his symptoms to give [place] some potential direction (? heart), and we will make an appointment for him in the clinic [facility]) to be re-evaluated.  He may require an Echo if he is dyspneic at all times.  With his previous closed head injury a good history has been challenging to obtain so we have been doing the best that we can with the information we can get.

## 2015-11-28 ENCOUNTER — Other Ambulatory Visit: Payer: Self-pay

## 2015-11-28 ENCOUNTER — Telehealth: Payer: Self-pay | Admitting: Internal Medicine

## 2015-11-28 ENCOUNTER — Ambulatory Visit (INDEPENDENT_AMBULATORY_CARE_PROVIDER_SITE_OTHER): Payer: Medicare Other | Admitting: Internal Medicine

## 2015-11-28 ENCOUNTER — Encounter (HOSPITAL_COMMUNITY): Payer: Self-pay | Admitting: Internal Medicine

## 2015-11-28 ENCOUNTER — Encounter: Payer: Self-pay | Admitting: Internal Medicine

## 2015-11-28 ENCOUNTER — Observation Stay (HOSPITAL_COMMUNITY)
Admission: AD | Admit: 2015-11-28 | Discharge: 2015-11-30 | Disposition: A | Discharge status: Home / Self Care | Payer: Medicare Other | Source: Ambulatory Visit | Attending: Infectious Diseases | Admitting: Infectious Diseases

## 2015-11-28 ENCOUNTER — Ambulatory Visit (HOSPITAL_COMMUNITY)
Admission: RE | Admit: 2015-11-28 | Discharge: 2015-11-28 | Disposition: A | Discharge status: Home / Self Care | Payer: Medicare Other | Source: Ambulatory Visit | Attending: Internal Medicine | Admitting: Internal Medicine

## 2015-11-28 VITALS — BP 93/64 | HR 107 | Temp 97.5°F | Wt 131.1 lb

## 2015-11-28 DIAGNOSIS — D509 Iron deficiency anemia, unspecified: Principal | ICD-10-CM | POA: Insufficient documentation

## 2015-11-28 DIAGNOSIS — E1151 Type 2 diabetes mellitus with diabetic peripheral angiopathy without gangrene: Secondary | ICD-10-CM | POA: Insufficient documentation

## 2015-11-28 DIAGNOSIS — I739 Peripheral vascular disease, unspecified: Secondary | ICD-10-CM | POA: Insufficient documentation

## 2015-11-28 DIAGNOSIS — Z902 Acquired absence of lung [part of]: Secondary | ICD-10-CM | POA: Diagnosis not present

## 2015-11-28 DIAGNOSIS — Z79899 Other long term (current) drug therapy: Secondary | ICD-10-CM | POA: Diagnosis not present

## 2015-11-28 DIAGNOSIS — Z7902 Long term (current) use of antithrombotics/antiplatelets: Secondary | ICD-10-CM | POA: Insufficient documentation

## 2015-11-28 DIAGNOSIS — Z9289 Personal history of other medical treatment: Secondary | ICD-10-CM

## 2015-11-28 DIAGNOSIS — Z87891 Personal history of nicotine dependence: Secondary | ICD-10-CM | POA: Diagnosis not present

## 2015-11-28 DIAGNOSIS — Z9221 Personal history of antineoplastic chemotherapy: Secondary | ICD-10-CM | POA: Insufficient documentation

## 2015-11-28 DIAGNOSIS — E1159 Type 2 diabetes mellitus with other circulatory complications: Secondary | ICD-10-CM | POA: Diagnosis present

## 2015-11-28 DIAGNOSIS — G894 Chronic pain syndrome: Secondary | ICD-10-CM | POA: Diagnosis present

## 2015-11-28 DIAGNOSIS — C3491 Malignant neoplasm of unspecified part of right bronchus or lung: Secondary | ICD-10-CM | POA: Diagnosis present

## 2015-11-28 DIAGNOSIS — R0602 Shortness of breath: Secondary | ICD-10-CM

## 2015-11-28 DIAGNOSIS — D62 Acute posthemorrhagic anemia: Secondary | ICD-10-CM | POA: Diagnosis not present

## 2015-11-28 DIAGNOSIS — Z85118 Personal history of other malignant neoplasm of bronchus and lung: Secondary | ICD-10-CM | POA: Insufficient documentation

## 2015-11-28 DIAGNOSIS — E785 Hyperlipidemia, unspecified: Secondary | ICD-10-CM | POA: Insufficient documentation

## 2015-11-28 DIAGNOSIS — C3411 Malignant neoplasm of upper lobe, right bronchus or lung: Secondary | ICD-10-CM

## 2015-11-28 DIAGNOSIS — F17211 Nicotine dependence, cigarettes, in remission: Secondary | ICD-10-CM | POA: Diagnosis not present

## 2015-11-28 DIAGNOSIS — Z95820 Peripheral vascular angioplasty status with implants and grafts: Secondary | ICD-10-CM | POA: Diagnosis not present

## 2015-11-28 DIAGNOSIS — Z888 Allergy status to other drugs, medicaments and biological substances status: Secondary | ICD-10-CM

## 2015-11-28 DIAGNOSIS — F418 Other specified anxiety disorders: Secondary | ICD-10-CM | POA: Diagnosis not present

## 2015-11-28 DIAGNOSIS — Z8719 Personal history of other diseases of the digestive system: Secondary | ICD-10-CM

## 2015-11-28 DIAGNOSIS — Z7984 Long term (current) use of oral hypoglycemic drugs: Secondary | ICD-10-CM | POA: Insufficient documentation

## 2015-11-28 DIAGNOSIS — Z87820 Personal history of traumatic brain injury: Secondary | ICD-10-CM | POA: Insufficient documentation

## 2015-11-28 DIAGNOSIS — J90 Pleural effusion, not elsewhere classified: Secondary | ICD-10-CM | POA: Diagnosis not present

## 2015-11-28 DIAGNOSIS — Z955 Presence of coronary angioplasty implant and graft: Secondary | ICD-10-CM

## 2015-11-28 DIAGNOSIS — Z833 Family history of diabetes mellitus: Secondary | ICD-10-CM

## 2015-11-28 DIAGNOSIS — D649 Anemia, unspecified: Secondary | ICD-10-CM

## 2015-11-28 DIAGNOSIS — E440 Moderate protein-calorie malnutrition: Secondary | ICD-10-CM | POA: Insufficient documentation

## 2015-11-28 HISTORY — DX: Personal history of other medical treatment: Z92.89

## 2015-11-28 HISTORY — DX: Headache, unspecified: R51.9

## 2015-11-28 HISTORY — DX: Headache: R51

## 2015-11-28 HISTORY — DX: Pneumonia, unspecified organism: J18.9

## 2015-11-28 LAB — CBC
HCT: 21 % — ABNORMAL LOW (ref 39.0–52.0)
Hemoglobin: 6.1 g/dL — CL (ref 13.0–17.0)
MCH: 23.1 pg — ABNORMAL LOW (ref 26.0–34.0)
MCHC: 29 g/dL — ABNORMAL LOW (ref 30.0–36.0)
MCV: 79.5 fL (ref 78.0–100.0)
Platelets: 561 10*3/uL — ABNORMAL HIGH (ref 150–400)
RBC: 2.64 MIL/uL — ABNORMAL LOW (ref 4.22–5.81)
RDW: 15.4 % (ref 11.5–15.5)
WBC: 11 10*3/uL — ABNORMAL HIGH (ref 4.0–10.5)

## 2015-11-28 LAB — TROPONIN I: Troponin I: <0.03 ng/mL (ref <0.03)

## 2015-11-28 LAB — GLUCOSE, CAPILLARY
Glucose-Capillary: 113 mg/dL — ABNORMAL HIGH (ref 65–99)
Glucose-Capillary: 146 mg/dL — ABNORMAL HIGH (ref 65–99)

## 2015-11-28 LAB — PREPARE RBC (CROSSMATCH)

## 2015-11-28 MED ORDER — ONDANSETRON HCL 4 MG PO TABS: 4.0000 mg | ORAL_TABLET | Freq: Four times a day (QID) | ORAL | Status: DC | PRN

## 2015-11-28 MED ORDER — SODIUM CHLORIDE 0.9 % IV BOLUS (SEPSIS)
1000.0000 mL | INTRAVENOUS | Status: AC
Start: 2015-11-28 — End: 2015-11-28
  Administered 2015-11-28: 1000 mL via INTRAVENOUS

## 2015-11-28 MED ORDER — SODIUM CHLORIDE 0.9 % IV SOLN
8.0000 mg/h | INTRAVENOUS | Status: DC
  Administered 2015-11-28 – 2015-11-30 (×4): 8 mg/h via INTRAVENOUS
  Filled 2015-11-28 (×9): qty 80

## 2015-11-28 MED ORDER — INSULIN ASPART 100 UNIT/ML ~~LOC~~ SOLN
0.0000 [IU] | SUBCUTANEOUS | Status: DC
  Administered 2015-11-29 (×2): 1 [IU] via SUBCUTANEOUS
  Administered 2015-11-29 (×2): 2 [IU] via SUBCUTANEOUS
  Administered 2015-11-30: 5 [IU] via SUBCUTANEOUS
  Administered 2015-11-30: 1 [IU] via SUBCUTANEOUS

## 2015-11-28 MED ORDER — SODIUM CHLORIDE 0.9 % IV SOLN
80.0000 mg | Freq: Once | INTRAVENOUS | Status: AC
  Administered 2015-11-28: 80 mg via INTRAVENOUS
  Filled 2015-11-28: qty 80

## 2015-11-28 MED ORDER — ONDANSETRON HCL 4 MG/2ML IJ SOLN: 4.0000 mg | Freq: Four times a day (QID) | INTRAMUSCULAR | Status: DC | PRN

## 2015-11-28 MED ORDER — SODIUM CHLORIDE 0.9 % IV SOLN: Freq: Once | INTRAVENOUS | Status: DC

## 2015-11-28 MED ORDER — SODIUM CHLORIDE 0.9 % IV SOLN
Freq: Once | INTRAVENOUS | Status: AC
  Administered 2015-11-29: via INTRAVENOUS

## 2015-11-28 MED ORDER — PANTOPRAZOLE SODIUM 40 MG IV SOLR: 40.0000 mg | Freq: Two times a day (BID) | INTRAVENOUS | Status: DC

## 2015-11-28 NOTE — Telephone Encounter (Signed)
[name] [name] was seen in the clinic today for shortness of breath. CBC was obtained and hemoglobin critical value of 6.1 was reported. I called the patient and spoke with the wife about the hemoglobin level. I advised her to bring her husband back to clinic for direct admit for work up of acute anemia and blood transfusion.  She expressed understanding and said she would return home as she was out running errands and would bring her husband back to clinic.

## 2015-11-28 NOTE — Patient Instructions (Signed)
Brett [name],  It was a pleasure meeting you and your wife today Someone will call you to make an appointment for an ECHO and to pick up your Cardiac monitor If your episodes of shortness of breath become more frequent please go to the Emergency Department

## 2015-11-28 NOTE — Progress Notes (Signed)
CC: shortness of breath  HPI:  [name] is a 55 y.o. gentleman with history noted below that presents to the internal medicine clinic for shortness of breath that started on 11/2 and has progressively gotten worse. He was recently seen in [facility] on 11/3 for episodes of shortness of breath.  He states that then he could not walk to the bathroom or kitchen without difficulty breathing. Today he states that he can have episodes of shortness of breath without exerting himself. Patient states he had a presyncopal episode over the weekend. He states he felt nauseous Sunday night and took a nausea pill before dinner. He was able to eat his meal but while at the dinner table had an episode of shortness of breath, diaphoresis and felt warm. He stated that this episode lasted a few minutes and passed. He denies any loss of consciousness during that episode. He denies chest pain, myalgias, or orthopnea.     Past Medical History:  Diagnosis Date  . Barrett's esophagus 07/01/2013   Without dysplasia on biopsy 09/03/2012. Repeat EGD recommended 08/2015  . Carotid artery stenosis 07/01/2013   Requiring right sided stent   . Chronic pain syndrome 07/01/2013  . Closed head injury with brief loss of consciousness ([place]) 07/22/2010   Head trapped in a hydraulic device at work.  Fracture of orbital bones on right and brief loss of consciousness per report.  . 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 There was likely a strong psycho-social overlay in regard to the cognitive deficits in the form of mood disorder with psychotic features and mixed anxiety symptomatology. His primary tested cognitive deficits are in the areas of attention, executi  . Degenerative joint disease of cervical spine 07/01/2013  . Dupuytren's contracture of both hands 04/08/2014  . Encounter for  antineoplastic chemotherapy 10/05/2015  . Erectile dysfunction associated with type 2 diabetes mellitus ([place]) 07/01/2013  . Fibromyalgia 07/01/2013  . Headache    constantly  . Hyperlipidemia LDL goal < 100 07/01/2013  . Memory changes    "memory issues" from head injury  . Osteoarthritis of right thumb 10/21/2014  . Peripheral vascular occlusive disease ([place]) 07/01/2013   Requiring 2 arterial stents above the left knee per report  . Post traumatic stress disorder 07/01/2013  . Primary lung adenocarcinoma ([place])   . Severe major depression with psychotic features ([place]) 04/15/2011  . Tobacco abuse 07/01/2013  . Tobacco abuse   . Type 2 diabetes mellitus with vascular disease ([place]) 07/01/2013   Left lower extremity and right carotid stenting    Review of Systems:  Review of Systems  Constitutional: Positive for malaise/fatigue. Negative for chills and fever.  Eyes: Negative for blurred vision.  Respiratory: Positive for shortness of breath. Negative for cough and wheezing.   Cardiovascular: Negative for chest pain and palpitations.  Gastrointestinal: Positive for abdominal pain and nausea.  Musculoskeletal: Negative for myalgias.  Neurological: Positive for headaches.     Physical Exam:  Vitals:   11/28/15 1100  BP: 93/64  Pulse: (!) 107  Temp: 97.5 F (36.4 C)  TempSrc: Oral  SpO2: 100%  Weight: 131 lb 1.6 oz (59.5 kg)   Physical Exam  Constitutional:  Frail in appearance with pallor  Cardiovascular: Normal rate, regular rhythm and normal heart sounds.  Exam reveals no gallop and no friction rub.   No murmur heard. Pulmonary/Chest: Effort normal.  No wheezes or rales noted. Rhonchi on the right side  Abdominal: Soft. He exhibits no distension.  Epigastric tenderness  Musculoskeletal: He exhibits no edema.    Assessment & Plan:   See encounters tab for problem based medical decision making.   Patient seen with Dr. Daryll Drown

## 2015-11-28 NOTE — H&P (Signed)
Date: 11/28/2015               Patient Name:  Brett [name] MRN: [id number]  DOB: Dec 01, 1960 Age / Sex: 55 y.o., male   PCP: [name], MD         Medical Service: Internal Medicine Teaching Service         Attending Physician: Dr. Campbell Riches, MD    First Contact: Dr. Reesa Chew Pager: [phone]  Second Contact: Dr. Benjamine Mola Pager: [phone]       After Hours (After 5p/  First Contact Pager: [phone]  weekends / holidays): Second Contact Pager: [phone]   Chief Complaint: Shortness of breath.  History of Present Illness: [name], [name], with past medical history significant for diabetes, adenocarcinoma of right upper lobe, had lobectomy on 10/18/2015, Barrett esophagus, traumatic brain injury causing cognitive impairment and peripheral vascular disease, requiring stent placement in left lower extremity and right carotid, came to internal medicine clinic today with complaint of worsening shortness of breath and found to have hemoglobin of 6.1, was advised to come to Hospital for further evaluation and management. Patient complaint of worsening shortness of breath, nausea and fatigued for about 2 weeks. He states that he has this shortness of breath since his surgery at the end of September, really get worse for the last few days to the point that he is unable to walk to bathroom without being dizzy and short of breath. He was recently seen on Friday because of shortness of breath, his CTA and chest x-ray was negative for any pulmonary embolism, only a small right pleural effusion was identified. He had a presyncopal episode on Sunday, during that time he is nauseating feeling dizzy and by described him as very pale. Patient complaint of mild epigastric discomfort and early satiety since his surgery. He do endorses having some dark-colored stools over the last week. Denies any hematochezia or hematuria. Denies any hematemesis. He denies any headache, nasal congestion, chest pain,  palpitations, orthopnea or PND. He did had a chronic cough, denies any recent change.    Meds:  No outpatient prescriptions have been marked as taking for the 11/28/15 encounter [facility] Encounter).     Allergies: Allergies as of 11/28/2015 - Review Complete 11/24/2015  Allergen Reaction Noted  . Celebrex [celecoxib]  07/01/2013  . Gabapentin Other (See Comments) 05/26/2013   Past Medical History:  Diagnosis Date  . Anemia 11/28/2015  . Barrett's esophagus 07/01/2013   Without dysplasia on biopsy 09/03/2012. Repeat EGD recommended 08/2015  . Carotid artery stenosis 07/01/2013   Requiring right sided stent   . Chronic pain syndrome 07/01/2013  . Closed head injury with brief loss of consciousness ([place]) 07/22/2010   Head trapped in a hydraulic device at work.  Fracture of orbital bones on right and brief loss of consciousness per report.  . 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 There was likely a strong psycho-social overlay in regard to the cognitive deficits in the form of mood disorder with psychotic features and mixed anxiety symptomatology. His primary tested cognitive deficits are in the areas of attention, executi  . Degenerative joint disease of cervical spine 07/01/2013  . Dupuytren's contracture of both hands 04/08/2014  . Encounter for antineoplastic chemotherapy 10/05/2015  . Erectile dysfunction associated with type 2 diabetes mellitus ([place]) 07/01/2013  . Fibromyalgia 07/01/2013  . Headache  constantly  . Hyperlipidemia LDL goal < 100 07/01/2013  . Memory changes    "memory issues" from head injury  . Osteoarthritis of right thumb 10/21/2014  . Peripheral vascular occlusive disease ([place]) 07/01/2013   Requiring 2 arterial stents above the left knee per report  . Post traumatic stress disorder 07/01/2013  . Primary lung adenocarcinoma  ([place])   . Severe major depression with psychotic features ([place]) 04/15/2011  . Tobacco abuse 07/01/2013  . Tobacco abuse   . Type 2 diabetes mellitus with vascular disease ([place]) 07/01/2013   Left lower extremity and right carotid stenting    Family History: Both parents had diabetes.  Social History: Recently quit smoking on 10/18/2015. Before that smoking 1 pack per day. Very occasionally drink alcohol. Denies any illicit drug use.  Review of Systems: A complete ROS was negative except as per HPI.   Physical Exam: Blood pressure (!) 92/57, pulse 82, temperature 98.1 F (36.7 C), temperature source Oral, resp. rate 18, SpO2 100 %.  Vitals:   11/28/15 1734 11/28/15 1800  BP: (!) 92/57   Pulse: 82   Resp: 18   Temp: 98.1 F (36.7 C)   TempSrc: Oral   SpO2: 100%   Weight:  127 lb 12.8 oz (58 kg)  Height:  '5\' 7"'$  (1.702 m)   General: Vital signs reviewed.  Patient is well-developed and lean man, in no acute distress and cooperative with exam.  Head: Normocephalic and atraumatic. Eyes: EOMI, conjunctival pallor, no scleral icterus.  Neck: Supple, trachea midline, normal ROM, no JVD, masses, thyromegaly, or carotid bruit present.  Cardiovascular: RRR, S1 normal, S2 normal, no murmurs, gallops, or rubs. Pulmonary/Chest: Clear to auscultation bilaterally, no wheezes, rales, or rhonchi. Abdominal: Soft, mild epigastric tenderness, non-distended, BS +, no masses, organomegaly, or guarding present.  Musculoskeletal: No joint deformities, erythema, or stiffness, ROM full and nontender. Extremities: No lower extremity edema bilaterally,  pulses symmetric and intact bilaterally. No cyanosis or clubbing. Neurological: A&O x3, Strength is normal and symmetric bilaterally, cranial nerve II-XII are grossly intact, no focal motor deficit, sensory intact to light touch bilaterally.  Skin: Warm, dry and intact. No rashes or erythema.  Labs. CBC Latest Ref Rng & Units 11/28/2015 10/21/2015 10/20/2015   WBC 4.0 - 10.5 K/uL 11.0(H) 8.8 9.9  Hemoglobin 13.0 - 17.0 g/dL 6.1(LL) 11.2(L) 10.3(L)  Hematocrit 39.0 - 52.0 % 21.0(L) 35.3(L) 32.8(L)  Platelets 150 - 400 K/uL 561(H) 280 267   BMP Latest Ref Rng & Units 11/24/2015 10/21/2015 10/20/2015  Glucose 65 - 99 mg/dL 220(H) 144(H) 134(H)  BUN 6 - 20 mg/dL '13 7 11  '$ Creatinine 0.61 - 1.24 mg/dL 0.83 0.64 0.75  BUN/Creat Ratio 9 - 20 - - -  Sodium 135 - 145 mmol/L 135 138 136  Potassium 3.5 - 5.1 mmol/L 4.7 3.8 3.8  Chloride 101 - 111 mmol/L 102 105 107  CO2 22 - 32 mmol/L '23 26 25  '$ Calcium 8.9 - 10.3 mg/dL 9.2 8.7(L) 8.2(L)   A1c. 6.4  EKG: Shows sinus tachycardia, heart rate 101.  CXR: FINDINGS: Heart size is normal. Changes of previous lobectomy on the right. No evidence of residual pleural air. Evidence of pulmonary resection in the lateral aspect of the remaining right lung. Left chest is clear. No infiltrate, collapse or effusion.  IMPRESSION: Good appearance following right lobectomy. No active disease presently. No residual pneumothorax.  CTA.  FINDINGS: Cardiovascular: Satisfactory opacification of the pulmonary arteries to the segmental level. No evidence of pulmonary embolism.  Normal heart size. No pericardial effusion.  Mediastinum/Nodes: No enlarged mediastinal, hilar, or axillary lymph nodes. Thyroid gland, trachea, and esophagus demonstrate no significant findings.  Lungs/Pleura: Small right pleural effusion. Postoperative change from right upper lobectomy identified. No complicating features identified.  Upper Abdomen: No acute abnormality.  Musculoskeletal: No chest wall abnormality. No acute or significant osseous findings.  Review of the MIP images confirms the above findings.  IMPRESSION: 1. No evidence for acute pulmonary embolus. 2. Postoperative appearance of the right lung compatible with recent right upper lobectomy. No complicating features identified. 3. Small right pleural  effusion.  Assessment & Plan by Problem:  Mr. Obrien, Huskins y.o man, with past medical history significant for diabetes, small cell lung carcinoma, had lobectomy on 10/18/2015, Barrett esophagus, traumatic brain injury causing cognitive impairment and peripheral vascular disease, requiring stent placement in left lower extremity and right carotid, came to internal medicine clinic today with complaint of worsening shortness of breath and found to have hemoglobin of 6.1, was advised to come to Hospital for further evaluation and management.  Symptomatic anemia. His hemoglobin today was 6.1 dropped from 11.2 on 10/21/15. With his recent history of surgery, Barrett esophagitis, epigastric discomfort, early satiety and dark color bowel movement, he might  have upper GI bleed. He was supposed to get his follow-up EGD in August, he never had his because of his diagnosis of lung cancer and its management. -FOBT -GI consult -Transfuse 2 unit of packed RBCs -Repeat CBC after transfusion. -Protonix 40 mg twice daily  Diabetes. He has well-controlled diabetes with recent A1c done on November 3 was 6.4. He is on glipizide 20 mg twice daily, metformin 500 mg twice daily and Januvia 100 mg daily at home. -SSI  Adenocarcinoma of the lung.S/P right upper lobe lobectomy on 10/18/2015. His recent chest x-ray and CTA done on November 3 was normal except mild right-sided pleural effusion. He has stage 1A adenocarcinoma of lung. He is following with oncology.  Code. Full Diet. NPO   Dispo: Admit patient to Observation with expected length of stay less than 2 midnights.  Signed: Lorella Nimrod, MD 11/28/2015, 6:18 PM  Pager: [phone]

## 2015-11-29 DIAGNOSIS — Z7984 Long term (current) use of oral hypoglycemic drugs: Secondary | ICD-10-CM | POA: Diagnosis not present

## 2015-11-29 DIAGNOSIS — E440 Moderate protein-calorie malnutrition: Secondary | ICD-10-CM

## 2015-11-29 DIAGNOSIS — I739 Peripheral vascular disease, unspecified: Secondary | ICD-10-CM | POA: Diagnosis not present

## 2015-11-29 DIAGNOSIS — Z79899 Other long term (current) drug therapy: Secondary | ICD-10-CM | POA: Diagnosis not present

## 2015-11-29 DIAGNOSIS — Z7902 Long term (current) use of antithrombotics/antiplatelets: Secondary | ICD-10-CM | POA: Diagnosis not present

## 2015-11-29 DIAGNOSIS — D509 Iron deficiency anemia, unspecified: Secondary | ICD-10-CM | POA: Diagnosis not present

## 2015-11-29 DIAGNOSIS — Z902 Acquired absence of lung [part of]: Secondary | ICD-10-CM | POA: Diagnosis not present

## 2015-11-29 DIAGNOSIS — Z9889 Other specified postprocedural states: Secondary | ICD-10-CM

## 2015-11-29 DIAGNOSIS — Z95820 Peripheral vascular angioplasty status with implants and grafts: Secondary | ICD-10-CM | POA: Diagnosis not present

## 2015-11-29 DIAGNOSIS — D62 Acute posthemorrhagic anemia: Secondary | ICD-10-CM | POA: Diagnosis not present

## 2015-11-29 DIAGNOSIS — J90 Pleural effusion, not elsewhere classified: Secondary | ICD-10-CM | POA: Diagnosis not present

## 2015-11-29 DIAGNOSIS — C3411 Malignant neoplasm of upper lobe, right bronchus or lung: Secondary | ICD-10-CM | POA: Diagnosis not present

## 2015-11-29 DIAGNOSIS — F17211 Nicotine dependence, cigarettes, in remission: Secondary | ICD-10-CM | POA: Diagnosis not present

## 2015-11-29 DIAGNOSIS — Z9221 Personal history of antineoplastic chemotherapy: Secondary | ICD-10-CM | POA: Diagnosis not present

## 2015-11-29 DIAGNOSIS — Z87820 Personal history of traumatic brain injury: Secondary | ICD-10-CM | POA: Diagnosis not present

## 2015-11-29 DIAGNOSIS — E785 Hyperlipidemia, unspecified: Secondary | ICD-10-CM | POA: Diagnosis not present

## 2015-11-29 DIAGNOSIS — D649 Anemia, unspecified: Secondary | ICD-10-CM | POA: Diagnosis present

## 2015-11-29 DIAGNOSIS — Z85118 Personal history of other malignant neoplasm of bronchus and lung: Secondary | ICD-10-CM | POA: Diagnosis not present

## 2015-11-29 DIAGNOSIS — Z87891 Personal history of nicotine dependence: Secondary | ICD-10-CM | POA: Diagnosis not present

## 2015-11-29 DIAGNOSIS — E1151 Type 2 diabetes mellitus with diabetic peripheral angiopathy without gangrene: Secondary | ICD-10-CM | POA: Diagnosis not present

## 2015-11-29 HISTORY — DX: Moderate protein-calorie malnutrition: E44.0

## 2015-11-29 LAB — GLUCOSE, CAPILLARY
Glucose-Capillary: 136 mg/dL — ABNORMAL HIGH (ref 65–99)
Glucose-Capillary: 137 mg/dL — ABNORMAL HIGH (ref 65–99)
Glucose-Capillary: 174 mg/dL — ABNORMAL HIGH (ref 65–99)

## 2015-11-29 LAB — BASIC METABOLIC PANEL
Anion gap: 8 (ref 5–15)
BUN: 10 mg/dL (ref 6–20)
CO2: 22 mmol/L (ref 22–32)
Calcium: 8.3 mg/dL — ABNORMAL LOW (ref 8.9–10.3)
Chloride: 109 mmol/L (ref 101–111)
Creatinine, Ser: 0.67 mg/dL (ref 0.61–1.24)
GFR calc Af Amer: >60 mL/min (ref >60)
GFR calc non Af Amer: >60 mL/min (ref >60)
Glucose, Bld: 120 mg/dL — ABNORMAL HIGH (ref 65–99)
Potassium: 3.8 mmol/L (ref 3.5–5.1)
Sodium: 139 mmol/L (ref 135–145)

## 2015-11-29 LAB — HEMOGLOBIN AND HEMATOCRIT, BLOOD
HCT: 26.9 % — ABNORMAL LOW (ref 39.0–52.0)
Hemoglobin: 8.4 g/dL — ABNORMAL LOW (ref 13.0–17.0)

## 2015-11-29 LAB — IRON AND TIBC
Iron Saturation: 3 % — CL (ref 15–55)
Iron: 12 ug/dL — ABNORMAL LOW (ref 38–169)
Total Iron Binding Capacity: 391 ug/dL (ref 250–450)
UIBC: 379 ug/dL — ABNORMAL HIGH (ref 111–343)

## 2015-11-29 LAB — CBC
HCT: 22.9 % — ABNORMAL LOW (ref 39.0–52.0)
Hemoglobin: 7 g/dL — ABNORMAL LOW (ref 13.0–17.0)
MCH: 24.6 pg — ABNORMAL LOW (ref 26.0–34.0)
MCHC: 30.6 g/dL (ref 30.0–36.0)
MCV: 80.4 fL (ref 78.0–100.0)
Platelets: 457 10*3/uL — ABNORMAL HIGH (ref 150–400)
RBC: 2.85 MIL/uL — ABNORMAL LOW (ref 4.22–5.81)
RDW: 15.7 % — ABNORMAL HIGH (ref 11.5–15.5)
WBC: 7.9 10*3/uL (ref 4.0–10.5)

## 2015-11-29 LAB — TROPONIN I
Troponin I: <0.03 ng/mL (ref <0.03)
Troponin I: <0.03 ng/mL (ref <0.03)

## 2015-11-29 LAB — FERRITIN: Ferritin: 15 ng/mL — ABNORMAL LOW (ref 30–400)

## 2015-11-29 MED ORDER — SODIUM CHLORIDE 0.9 % IV SOLN
INTRAVENOUS | Status: DC
  Administered 2015-11-29: 12:00:00 via INTRAVENOUS

## 2015-11-29 MED ORDER — PEG 3350-KCL-NA BICARB-NACL 420 G PO SOLR
4000.0000 mL | Freq: Once | ORAL | Status: AC
  Administered 2015-11-29: 4000 mL via ORAL
  Filled 2015-11-29: qty 4000

## 2015-11-29 NOTE — Care Management Note (Signed)
Case Management Note  Patient Details  Name: Brett [name] MRN: [id number] Date of Birth: 08/21/1960  Subjective/Objective:                 Patient from home with spouse. Has cane available for use at home, pt states he does not use it much. Pt drives, denies difficulties obtaining meds. Will have EGD 11/9, Hgb improving s/p transfusion. PCP Eppie Gibson  Action/Plan:  Anticipate DC to home w/o needs if EGD clear and symptoms resolved.   Expected Discharge Date:                  Expected Discharge Plan:  Home/Self Care  In-House Referral:  NA  Discharge planning Services  CM Consult  Post Acute Care Choice:    Choice offered to:     DME Arranged:    DME Agency:     HH Arranged:    HH Agency:     Status of Service:  In process, will continue to follow  If discussed at Long Length of Stay Meetings, dates discussed:    Additional Comments:  Carles Collet, RN 11/29/2015, 12:06 PM

## 2015-11-29 NOTE — Anesthesia Preprocedure Evaluation (Addendum)
Anesthesia Evaluation  Patient identified by MRN, date of birth, ID band Patient awake    Reviewed: Allergy & Precautions, H&P , NPO status , Patient's Chart, lab work & pertinent test results  Airway Mallampati: I  TM Distance: >3 FB Neck ROM: Full    Dental no notable dental hx. (+) Edentulous Upper, Edentulous Lower, Dental Advisory Given   Pulmonary neg pulmonary ROS, former smoker,    Pulmonary exam normal breath sounds clear to auscultation       Cardiovascular + Peripheral Vascular Disease  negative cardio ROS   Rhythm:Regular Rate:Normal     Neuro/Psych  Headaches, Anxiety Depression negative psych ROS   GI/Hepatic negative GI ROS, Neg liver ROS,   Endo/Other  diabetes, Type 2, Oral Hypoglycemic Agents  Renal/GU negative Renal ROS  negative genitourinary   Musculoskeletal  (+) Arthritis , Osteoarthritis,  Fibromyalgia -  Abdominal   Peds  Hematology negative hematology ROS (+) anemia ,   Anesthesia Other Findings   Reproductive/Obstetrics negative OB ROS                            Anesthesia Physical Anesthesia Plan  ASA: III  Anesthesia Plan: MAC   Post-op Pain Management:    Induction: Intravenous  Airway Management Planned: Nasal Cannula  Additional Equipment:   Intra-op Plan:   Post-operative Plan:   Informed Consent: I have reviewed the patients History and Physical, chart, labs and discussed the procedure including the risks, benefits and alternatives for the proposed anesthesia with the patient or authorized representative who has indicated his/her understanding and acceptance.   Dental advisory given  Plan Discussed with: CRNA  Anesthesia Plan Comments:         Anesthesia Quick Evaluation

## 2015-11-29 NOTE — Consult Note (Signed)
[facility] Consult Note  Referring Provider: No ref. provider found Primary Care Physician:  [name], MD Primary Gastroenterologist:  Dr.  Laurel Dimmer Complaint: Dyspnea HPI: AUBURN HERT is an 55 y.o. white male  who presents with worsening dyspnea on exertion and fatigue and had a hemoglobin of 6.1 down from 9.72 months ago. He noticed a couple of dark stools in the last week. He denies any abdominal pain. He has never had a colonoscopy. He had an EGD in 2014 which showed short segment Barrett's esophagus. He denies use of nonsteroidal anti-inflammatory drugs.  Past Medical History:  Diagnosis Date  . Anemia 11/28/2015  . Anxiety   . Arthritis    "hands, back" (11/28/2015)  . Barrett's esophagus 07/01/2013   Without dysplasia on biopsy 09/03/2012. Repeat EGD recommended 08/2015  . Carotid artery stenosis 07/01/2013   Requiring right sided stent   . Chronic pain syndrome 07/01/2013  . Closed head injury with brief loss of consciousness ([place]) 07/22/2010   Head trapped in a hydraulic device at work.  Fracture of orbital bones on right and brief loss of consciousness per report.  . 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 There was likely a strong psycho-social overlay in regard to the cognitive deficits in the form of mood disorder with psychotic features and mixed anxiety symptomatology. His primary tested cognitive deficits are in the areas of attention, executi  . Daily headache "since 07/2010"   constantly  . Degenerative joint disease of cervical spine 07/01/2013  . Dupuytren's contracture of both hands 04/08/2014  . Encounter for antineoplastic chemotherapy 10/05/2015  . Erectile dysfunction associated with type 2 diabetes mellitus ([place]) 07/01/2013  . Fibromyalgia 07/01/2013  . History of blood transfusion 11/28/2015   "suppose to  get his first today" (11/28/2015)  . Hyperlipidemia LDL goal < 100 07/01/2013  . Memory changes    "memory issues" from head injury  . Osteoarthritis of right thumb 10/21/2014  . Peripheral vascular occlusive disease ([place]) 07/01/2013   Requiring 2 arterial stents above the left knee per report  . Pneumonia ~ 2006/2007  . Post traumatic stress disorder 07/01/2013  . Primary lung adenocarcinoma ([place]) dx'd 08/2015   "right lung"  . Severe major depression with psychotic features ([place]) 04/15/2011  . Tobacco abuse 07/01/2013  . Tobacco abuse   . Type 2 diabetes mellitus with vascular disease ([place]) 07/01/2013   Left lower extremity and right carotid stenting    Past Surgical History:  Procedure Laterality Date  . CAROTID STENT Right ?2014  . FEMORAL ARTERY STENT Left 05/2012; ~ 2015   Archie Endo 06/04/2012; [facility]hel [place] report  . FRACTURE SURGERY    . HARDWARE REMOVAL Right 11/15/2011   Removal of deep frontozygomatic orbital hardware/notes 11/15/2011  . HERNIA REPAIR  4580   Umbilical  . ORIF ORBITAL FRACTURE Right 08/15/2010    caught in a hydraulic machine; open reduction internal fixation of orbital rim fracture and open reduction of zygomatic arch fracture  Archie Endo 10/13/2009  . VIDEO ASSISTED THORACOSCOPY (VATS)/ LOBECTOMY Right 10/18/2015   Procedure: VIDEO ASSISTED THORACOSCOPY (VATS)/ LOBECTOMY;  Surgeon: Melrose Nakayama, MD;  Location: [facility];  Service: Thoracic;  Laterality: Right;  Marland Kitchen VIDEO BRONCHOSCOPY Bilateral 09/21/2015   Procedure: VIDEO BRONCHOSCOPY WITH FLUORO;  Surgeon: Juanito Doom, MD;  Location: [facility];  Service: Cardiopulmonary;  Laterality: Bilateral;    Medications Prior to Admission  Medication Sig Dispense Refill  . aspirin EC 81 MG tablet Take 81 mg by mouth daily.    Marland Kitchen atorvastatin (LIPITOR) 40 MG tablet Take 1 tablet (40 mg total) by mouth daily. 90 tablet 3  . clopidogrel (PLAVIX) 75 MG tablet Take 75 mg by mouth daily with breakfast.    . glipiZIDE  (GLUCOTROL) 10 MG tablet Take 2 tablets (20 mg total) by mouth 2 (two) times daily before a meal. 360 tablet 3  . HYDROcodone-acetaminophen (NORCO) 10-325 MG tablet Take 1 tablet by mouth every 6 (six) hours as needed for severe pain. 90 tablet 0  . lisinopril (PRINIVIL,ZESTRIL) 5 MG tablet Take 1 tablet (5 mg total) by mouth daily. 90 tablet 3  . metFORMIN (GLUCOPHAGE) 500 MG tablet Take 1 tablet (500 mg total) by mouth 2 (two) times daily with a meal. 180 tablet 3  . pantoprazole (PROTONIX) 40 MG tablet Take 1 tablet (40 mg total) by mouth daily. 90 tablet 3  . prochlorperazine (COMPAZINE) 10 MG tablet Take 1 tablet (10 mg total) by mouth every 6 (six) hours as needed for nausea or vomiting. 30 tablet 0  . sertraline (ZOLOFT) 25 MG tablet Take 1 tablet (25 mg total) by mouth daily. 90 tablet 1  . sitaGLIPtin (JANUVIA) 100 MG tablet Take 1 tablet (100 mg total) by mouth daily. 90 tablet 3    Allergies:  Allergies  Allergen Reactions  . Celebrex [Celecoxib]     Nightmares  . Gabapentin Other (See Comments)    suicidal thoughts    Family History  Problem Relation Age of Onset  . Heart disease Mother   . Diabetes Mother   . Diabetes Father   . Heart attack Father   . Diabetes Sister   . Coronary artery disease Sister     s/p CABG  . Diabetes Brother   . Healthy Daughter   . Healthy Son   . Diabetes Sister   . Healthy Sister   . Healthy Sister   . Diabetes Brother   . Coronary artery disease Brother     s/p CABG  . Diabetes Brother   . Healthy Brother   . Healthy Brother   . Healthy Daughter     Social History:  reports that he quit smoking about 6 weeks ago. His smoking use included Cigarettes. He has a 35.00 pack-year smoking history. He has never used smokeless tobacco. He reports that he drinks alcohol. He reports that he does not use drugs.  Review of Systems: negative except As above   Blood pressure (!) 110/56, pulse 76, temperature 98.4 F (36.9 C), temperature  source Oral, resp. rate 18, height _0  (1.702 m), weight 58 kg (127 lb 12.8 oz), SpO2 100 %. Head: Normocephalic, without obvious abnormality, atraumatic Neck: no adenopathy, no carotid bruit, no JVD, supple, symmetrical, trachea midline and thyroid not enlarged, symmetric, no tenderness/mass/nodules Resp: clear to auscultation bilaterally Cardio: regular rate and rhythm, S1, S2 normal, no murmur, click, rub or gallop GI: Abdomen soft nondistended with normoactive bowel sounds. No hepatosplenomegaly mass or guarding. Extremities: extremities normal, atraumatic, no cyanosis or edema  Results for orders placed or performed during the hospital encounter of 11/28/15 (from the past 48 hour(s))  Type and screen [place]     Status: None (Preliminary result)   Collection Time: 11/28/15  7:10 PM  Result Value Ref Range   ABO/RH(D) A POS    Antibody Screen NEG    Sample Expiration 12/01/2015  Unit Number [id number]    Blood Component Type RED CELLS,LR    Unit division 00    Status of Unit ISSUED    Transfusion Status OK TO TRANSFUSE    Crossmatch Result Compatible    Unit Number [id number]    Blood Component Type RED CELLS,LR    Unit division 00    Status of Unit ISSUED    Transfusion Status OK TO TRANSFUSE    Crossmatch Result Compatible   Prepare RBC     Status: None   Collection Time: 11/28/15  7:10 PM  Result Value Ref Range   Order Confirmation ORDER PROCESSED BY BLOOD BANK   Troponin I (q 6hr x 3)     Status: None   Collection Time: 11/28/15  7:29 PM  Result Value Ref Range   Troponin I <0.03 <0.03 ng/mL  Glucose, capillary     Status: Abnormal   Collection Time: 11/28/15  7:49 PM  Result Value Ref Range   Glucose-Capillary 146 (H) 65 - 99 mg/dL  Glucose, capillary     Status: Abnormal   Collection Time: 11/29/15 12:02 AM  Result Value Ref Range   Glucose-Capillary 113 (H) 65 - 99 mg/dL  Basic metabolic panel     Status: Abnormal   Collection  Time: 11/29/15  2:07 AM  Result Value Ref Range   Sodium 139 135 - 145 mmol/L   Potassium 3.8 3.5 - 5.1 mmol/L   Chloride 109 101 - 111 mmol/L   CO2 22 22 - 32 mmol/L   Glucose, Bld 120 (H) 65 - 99 mg/dL   BUN 10 6 - 20 mg/dL   Creatinine, Ser 0.67 0.61 - 1.24 mg/dL   Calcium 8.3 (L) 8.9 - 10.3 mg/dL   GFR calc non Af Amer >60 >60 mL/min   GFR calc Af Amer >60 >60 mL/min    Comment: (NOTE) The eGFR has been calculated using the CKD EPI equation. This calculation has not been validated in all clinical situations. eGFR's persistently <60 mL/min signify possible Chronic Kidney Disease.    Anion gap 8 5 - 15  CBC     Status: Abnormal   Collection Time: 11/29/15  2:07 AM  Result Value Ref Range   WBC 7.9 4.0 - 10.5 K/uL   RBC 2.85 (L) 4.22 - 5.81 MIL/uL   Hemoglobin 7.0 (L) 13.0 - 17.0 g/dL   HCT 22.9 (L) 39.0 - 52.0 %   MCV 80.4 78.0 - 100.0 fL   MCH 24.6 (L) 26.0 - 34.0 pg   MCHC 30.6 30.0 - 36.0 g/dL   RDW 15.7 (H) 11.5 - 15.5 %   Platelets 457 (H) 150 - 400 K/uL  Troponin I (q 6hr x 3)     Status: None   Collection Time: 11/29/15  2:07 AM  Result Value Ref Range   Troponin I <0.03 <0.03 ng/mL  Glucose, capillary     Status: Abnormal   Collection Time: 11/29/15  4:49 AM  Result Value Ref Range   Glucose-Capillary 136 (H) 65 - 99 mg/dL   No results found.  Assessment: Anemia, unclear whether secondary to GI blood loss Plan:  Will proceed with EGD and colonoscopy tomorrow. Zylpha Poynor C 11/29/2015, 7:43 AM  Pager [phone] If no answer or after 5 PM call [phone]

## 2015-11-29 NOTE — Progress Notes (Signed)
   Subjective: Patient was feeling much better this morning. He had his 2 units of packed RBCs transfusion completed. Denies any chest pain or palpitations. He did not had any bowel movements since his admission. Objective:  Vital signs in last 24 hours: Vitals:   11/29/15 0130 11/29/15 0202 11/29/15 0319 11/29/15 0520  BP: 96/67 100/62 (!) 90/55 (!) 110/56  Pulse: 80 81 67 76  Resp: '18 18 18 18  '$ Temp: 97.6 F (36.4 C) 98.7 F (37.1 C) 98.5 F (36.9 C) 98.4 F (36.9 C)  TempSrc: Oral Oral Oral Oral  SpO2: 100% 100% 100% 100%  Weight:      Height:       Gen. well-developed, lean man, lying comfortably in bed, in no acute distress. Eyes. Conjunctival pallor improved as compared to yesterday after transfusion. Lungs. Clear bilaterally CV. Regular rate and rhythm. Abdomen. Soft, nontender, nondistended, bowel sounds positive. Extremities. No edema, no cyanosis, pulses 2+ bilaterally.  Labs. CBC Latest Ref Rng & Units 11/29/2015 11/29/2015 11/28/2015  WBC 4.0 - 10.5 K/uL - 7.9 11.0(H)  Hemoglobin 13.0 - 17.0 g/dL 8.4(L) 7.0(L) 6.1(LL)  Hematocrit 39.0 - 52.0 % 26.9(L) 22.9(L) 21.0(L)  Platelets 150 - 400 K/uL - 457(H) 561(H)   Iron/TIBC/Ferritin/ %Sat    Component Value Date/Time   IRON 12 (L) 11/28/2015 1206   TIBC 391 11/28/2015 1206   FERRITIN 15 (L) 11/28/2015 1206   IRONPCTSAT 3 (LL) 11/28/2015 1206   CBG. [phone]  Assessment/Plan:  [name], [name], with past medical history significant for diabetes, small cell lung carcinoma, had lobectomy on 10/18/2015, Barrett esophagus, traumatic brain injury causing cognitive impairment and peripheral vascular disease, requiring stent placement in left lower extremity and right carotid, came to internal medicine clinic today with complaint of worsening shortness of breath and found to have hemoglobin of 6.1, was advised to come to Hospital for further evaluation and management.  Symptomatic anemia. His hemoglobin improved to  8.4 after 2 units of packed RBCs. Patient was feeling better. His iron and ferritin studies shows iron deficiency anemia. Dr. Amedeo Plenty from GI saw him today, we will do EGD and colonoscopy tomorrow. -Monitor CBG. -Continue protonic 40 mg twice daily.  Diabetes.He has well-controlled diabetes with recent A1c done on November 3 was 6.4. His CBGs stays between 113- 146 over last 24 hours. -Continue sliding scale.  Adenocarcinoma of the lung.S/P right upper lobe lobectomy on 10/18/2015. His recent chest x-ray and CTA done on November 3 was normal except mild right-sided pleural effusion. He has stage 1A adenocarcinoma of lung. He is following with oncology.  Dispo: Anticipated discharge in approximately 1-2 day(s).   Lorella Nimrod, MD 11/29/2015, 9:59 AM Pager: [phone]

## 2015-11-29 NOTE — Care Management Obs Status (Signed)
[facility] NOTIFICATION   Patient Details  Name: Brett [name] MRN: [id number] Date of Birth: 09/24/1960   Medicare Observation Status Notification Given:  Yes    Carles Collet, RN 11/29/2015, 12:05 PM

## 2015-11-29 NOTE — Progress Notes (Signed)
Initial Nutrition Assessment  DOCUMENTATION CODES:   Severe malnutrition in context of acute illness/injury  INTERVENTION:   -Boost Breeze po TID, each supplement provides 250 kcal and 9 grams of protein  NUTRITION DIAGNOSIS:   Malnutrition related to acute illness as evidenced by percent weight loss, moderate depletion of body fat, moderate depletions of muscle mass.  GOAL:   Patient will meet greater than or equal to 90% of their needs  MONITOR:   PO intake, Supplement acceptance, Diet advancement, Labs, Weight trends, Skin, I & O's  REASON FOR ASSESSMENT:   Malnutrition Screening Tool    ASSESSMENT:   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  Pt admitted with symptomatic anemia.   Spoke with pt at bedside, who reports a general decline in health over the past month or so. He shares that he has been feeling much weaker and became very concerned yesterday when he needed wheelchair assistance.   Pt reports that he has had a very poor appetite over the past month. Pt shares that she typically eats 2 times per day and he has been eating significantly less. He reveals UBW is around 135#. Per wt hx, pt has experienced a 5.9% wt loss over the past month, which is significant for time frame.   Nutrition-Focused physical exam completed. Findings are mild to moderate fat depletion, mild to moderate muscle depletion, and no edema.   Pt is very concerned over his muscle loss. Discussed ways pt could incorporate additional calories  and protein in diet. Pt reports he has tried Ensure supplements intermittently in the past and is willing to try during hospitalization.   Labs reviewed: CBGS: 113-146.   Diet Order:  Diet NPO time specified Diet clear liquid Room service appropriate? Yes; Fluid consistency: Thin  Skin:  Reviewed, no issues  Last BM:  11/28/15  Height:   Ht Readings from Last 1 Encounters:  11/28/15 '5\' 7"'$  (1.702 m)    Weight:   Wt Readings from Last 1 Encounters:  11/28/15 127 lb 12.8 oz (58 kg)    Ideal Body Weight:  67.8 kg  BMI:  Body mass index is 20.02 kg/m.  Estimated Nutritional Needs:   Kcal:  1550-1750  Protein:  75-90 grams  Fluid:  1.5-1.7 L  EDUCATION NEEDS:   Education needs addressed  Brett Brett [name] A. Jimmye Norman, RD, LDN, CDE Pager: [phone] After hours Pager: [phone]

## 2015-11-29 NOTE — H&P (Signed)
  Date: 11/29/2015  Patient name: Brett [name]  Medical record number: [id number]  Date of birth: 21-Mar-1960   I have seen and evaluated Lynnae Prude and discussed their care with the Residency Team.   55 yo M with hx of adenoCa of lung, RUL lobectomy 10-18-15 as well as a hx of Barrett's Esophagitis.Marland Kitchen He returns to hospital with 2 weeks of worsening SOB. He had CTA prior to adm that did not show PE but did show a small R pleural effusion.  He also noted 1 week of dark stool pta. He was found to have BP 92/57 and Hgb 6.1.  He was admitted and has since received 2u PRBC and feels much better. His Hgb is now 8.4.   PMHx, Fam Hx, and/or Soc Hx : reviewed per H/O's note.   Vitals:   11/29/15 0319 11/29/15 0520  BP: (!) 90/55 (!) 110/56  Pulse: 67 76  Resp: 18 18  Temp: 98.5 F (36.9 C) 98.4 F (36.9 C)   No icterus Mildly pale Eyes: EOMI, PERRL Mouth: without lesions. Dentures CV: RRR Chest: CTA Abd: Bs+, soft, nontender.  Extr: no edema  labs reviewed.   Assessment and Plan: I have seen and evaluated the patient as outlined above. I agree with the formulated Assessment and Plan as detailed in the residents' note, with the following changes:   1. Suspected GI bleed He is to have upper and lower endoscopy on 11-9.  His Hgb has stabilized overnight with transfusions.  Await H pylori testing.  Will replete his iron stores.  Holding his previous anti-plt therapy for his PVD (with stents).    Campbell Riches, MD 11/8/201712:16 PM

## 2015-11-30 ENCOUNTER — Observation Stay (HOSPITAL_COMMUNITY): Payer: Medicare Other | Admitting: Anesthesiology

## 2015-11-30 ENCOUNTER — Encounter (HOSPITAL_COMMUNITY): Payer: Self-pay

## 2015-11-30 ENCOUNTER — Encounter (HOSPITAL_COMMUNITY)
Admission: AD | Disposition: A | Discharge status: Home / Self Care | Payer: Self-pay | Source: Ambulatory Visit | Attending: Infectious Diseases

## 2015-11-30 DIAGNOSIS — Z79899 Other long term (current) drug therapy: Secondary | ICD-10-CM | POA: Diagnosis not present

## 2015-11-30 DIAGNOSIS — Z902 Acquired absence of lung [part of]: Secondary | ICD-10-CM | POA: Diagnosis not present

## 2015-11-30 DIAGNOSIS — M797 Fibromyalgia: Secondary | ICD-10-CM | POA: Diagnosis not present

## 2015-11-30 DIAGNOSIS — Z9221 Personal history of antineoplastic chemotherapy: Secondary | ICD-10-CM | POA: Diagnosis not present

## 2015-11-30 DIAGNOSIS — I739 Peripheral vascular disease, unspecified: Secondary | ICD-10-CM | POA: Diagnosis not present

## 2015-11-30 DIAGNOSIS — D62 Acute posthemorrhagic anemia: Secondary | ICD-10-CM | POA: Diagnosis not present

## 2015-11-30 DIAGNOSIS — Z7984 Long term (current) use of oral hypoglycemic drugs: Secondary | ICD-10-CM | POA: Diagnosis not present

## 2015-11-30 DIAGNOSIS — Z87820 Personal history of traumatic brain injury: Secondary | ICD-10-CM | POA: Diagnosis not present

## 2015-11-30 DIAGNOSIS — D509 Iron deficiency anemia, unspecified: Secondary | ICD-10-CM | POA: Diagnosis not present

## 2015-11-30 DIAGNOSIS — Z7902 Long term (current) use of antithrombotics/antiplatelets: Secondary | ICD-10-CM | POA: Diagnosis not present

## 2015-11-30 DIAGNOSIS — E785 Hyperlipidemia, unspecified: Secondary | ICD-10-CM | POA: Diagnosis not present

## 2015-11-30 DIAGNOSIS — J90 Pleural effusion, not elsewhere classified: Secondary | ICD-10-CM | POA: Diagnosis not present

## 2015-11-30 DIAGNOSIS — Z85118 Personal history of other malignant neoplasm of bronchus and lung: Secondary | ICD-10-CM | POA: Diagnosis not present

## 2015-11-30 DIAGNOSIS — Z95820 Peripheral vascular angioplasty status with implants and grafts: Secondary | ICD-10-CM | POA: Diagnosis not present

## 2015-11-30 DIAGNOSIS — C3411 Malignant neoplasm of upper lobe, right bronchus or lung: Secondary | ICD-10-CM | POA: Diagnosis not present

## 2015-11-30 DIAGNOSIS — E1151 Type 2 diabetes mellitus with diabetic peripheral angiopathy without gangrene: Secondary | ICD-10-CM | POA: Diagnosis not present

## 2015-11-30 DIAGNOSIS — K2270 Barrett's esophagus without dysplasia: Secondary | ICD-10-CM | POA: Diagnosis not present

## 2015-11-30 DIAGNOSIS — Z87891 Personal history of nicotine dependence: Secondary | ICD-10-CM | POA: Diagnosis not present

## 2015-11-30 HISTORY — PX: ESOPHAGOGASTRODUODENOSCOPY: SHX5428

## 2015-11-30 HISTORY — PX: COLONOSCOPY: SHX5424

## 2015-11-30 LAB — CBC
HCT: 26.1 % — ABNORMAL LOW (ref 39.0–52.0)
Hemoglobin: 8 g/dL — ABNORMAL LOW (ref 13.0–17.0)
MCH: 24.7 pg — ABNORMAL LOW (ref 26.0–34.0)
MCHC: 30.7 g/dL (ref 30.0–36.0)
MCV: 80.6 fL (ref 78.0–100.0)
Platelets: 434 10*3/uL — ABNORMAL HIGH (ref 150–400)
RBC: 3.24 MIL/uL — ABNORMAL LOW (ref 4.22–5.81)
RDW: 15.5 % (ref 11.5–15.5)
WBC: 6.1 10*3/uL (ref 4.0–10.5)

## 2015-11-30 LAB — TYPE AND SCREEN
ABO/RH(D): A POS
Antibody Screen: NEGATIVE
Unit division: 0
Unit division: 0

## 2015-11-30 LAB — GLUCOSE, CAPILLARY
Glucose-Capillary: 134 mg/dL — ABNORMAL HIGH (ref 65–99)
Glucose-Capillary: 116 mg/dL — ABNORMAL HIGH (ref 65–99)
Glucose-Capillary: 129 mg/dL — ABNORMAL HIGH (ref 65–99)
Glucose-Capillary: 130 mg/dL — ABNORMAL HIGH (ref 65–99)
Glucose-Capillary: 178 mg/dL — ABNORMAL HIGH (ref 65–99)
Glucose-Capillary: 264 mg/dL — ABNORMAL HIGH (ref 65–99)

## 2015-11-30 SURGERY — COLONOSCOPY
Anesthesia: Monitor Anesthesia Care

## 2015-11-30 SURGERY — EGD (ESOPHAGOGASTRODUODENOSCOPY)
Anesthesia: Monitor Anesthesia Care

## 2015-11-30 MED ORDER — PANTOPRAZOLE SODIUM 40 MG PO TBEC
40.0000 mg | DELAYED_RELEASE_TABLET | Freq: Every day | ORAL | Status: DC
  Administered 2015-11-30: 40 mg via ORAL
  Filled 2015-11-30: qty 1

## 2015-11-30 MED ORDER — BUTAMBEN-TETRACAINE-BENZOCAINE 2-2-14 % EX AERO
INHALATION_SPRAY | CUTANEOUS | Status: DC | PRN
  Administered 2015-11-30: 2 via TOPICAL

## 2015-11-30 MED ORDER — PHENYLEPHRINE HCL 10 MG/ML IJ SOLN: INTRAMUSCULAR | Status: DC | PRN

## 2015-11-30 MED ORDER — PHENYLEPHRINE HCL 10 MG/ML IJ SOLN
INTRAMUSCULAR | Status: DC | PRN
  Administered 2015-11-30: 80 ug via INTRAVENOUS
  Administered 2015-11-30: 120 ug via INTRAVENOUS
  Administered 2015-11-30 (×2): 40 ug via INTRAVENOUS

## 2015-11-30 MED ORDER — MIDAZOLAM HCL 5 MG/5ML IJ SOLN
INTRAMUSCULAR | Status: DC | PRN
  Administered 2015-11-30: 2 mg via INTRAVENOUS

## 2015-11-30 MED ORDER — EPHEDRINE SULFATE-NACL 50-0.9 MG/10ML-% IV SOSY
PREFILLED_SYRINGE | INTRAVENOUS | Status: DC | PRN
  Administered 2015-11-30: 5 mg via INTRAVENOUS
  Administered 2015-11-30: 10 mg via INTRAVENOUS
  Administered 2015-11-30 (×2): 5 mg via INTRAVENOUS

## 2015-11-30 MED ORDER — FERROUS SULFATE 325 (65 FE) MG PO TABS: 325.0000 mg | ORAL_TABLET | Freq: Two times a day (BID) | ORAL | 3 refills | Status: DC

## 2015-11-30 MED ORDER — FERROUS SULFATE 325 (65 FE) MG PO TABS: 325.0000 mg | ORAL_TABLET | Freq: Two times a day (BID) | ORAL | Status: DC

## 2015-11-30 MED ORDER — LIDOCAINE HCL (CARDIAC) 20 MG/ML IV SOLN
INTRAVENOUS | Status: DC | PRN
  Administered 2015-11-30: 40 mg via INTRATRACHEAL

## 2015-11-30 MED ORDER — PROPOFOL 10 MG/ML IV BOLUS
INTRAVENOUS | Status: DC | PRN
  Administered 2015-11-30: 20 mg via INTRAVENOUS
  Administered 2015-11-30 (×2): 10 mg via INTRAVENOUS
  Administered 2015-11-30: 30 mg via INTRAVENOUS
  Administered 2015-11-30: 40 mg via INTRAVENOUS
  Administered 2015-11-30: 10 mg via INTRAVENOUS
  Administered 2015-11-30: 20 mg via INTRAVENOUS

## 2015-11-30 NOTE — Op Note (Signed)
[facility] Patient Name: Brett [name] Procedure Date : 11/30/2015 MRN: [id number] Attending MD: Missy Sabins , MD Date of Birth: 29-Sep-1960 CSN: [id number] Age: 55 Admit Type: Inpatient Procedure:                Colonoscopy Indications:              Iron deficiency anemia Providers:                Elyse Jarvis. Amedeo Plenty, MD, [name], RN Referring MD:              Medicines:                Propofol per Anesthesia Complications:            No immediate complications. Estimated Blood Loss:     Estimated blood loss: none. Procedure:                Pre-Anesthesia Assessment:                           - Prior to the procedure, a History and Physical                            was performed, and patient medications and                            allergies were reviewed. The patient's tolerance of                            previous anesthesia was also reviewed. The risks                            and benefits of the procedure and the sedation                            options and risks were discussed with the patient.                            All questions were answered, and informed consent                            was obtained. Prior Anticoagulants: The patient has                            taken no previous anticoagulant or antiplatelet                            agents. ASA Grade Assessment: II - A patient with                            mild systemic disease. After reviewing the risks                            and benefits, the patient was deemed in  satisfactory condition to undergo the procedure.                           After obtaining informed consent, the colonoscope                            was passed under direct vision. Throughout the                            procedure, the patient's blood pressure, pulse, and                            oxygen saturations were monitored continuously. The                            Colonoscope was  introduced through the anus and                            advanced to the the cecum, identified by                            appendiceal orifice and ileocecal valve. The                            colonoscopy was performed without difficulty. The                            patient tolerated the procedure well. The quality                            of the bowel preparation was fair. The ileocecal                            valve, appendiceal orifice, and rectum were                            photographed. Scope In: 9:16:34 AM Scope Out: 9:29:47 AM Scope Withdrawal Time: 0 hours 10 minutes 5 seconds  Total Procedure Duration: 0 hours 13 minutes 13 seconds  Findings:      The entire examined colon appeared normal on direct and retroflexion       views. Impression:               - Preparation of the colon was fair.                           - The entire examined colon is normal on direct and                            retroflexion views.                           - No specimens collected. Moderate Sedation:      no moderate sedation Recommendation:           - Patient has a contact number available  for                            emergencies. The signs and symptoms of potential                            delayed complications were discussed with the                            patient. Return to normal activities tomorrow.                            Written discharge instructions were provided to the                            patient.                           - Resume previous diet.                           - Continue present medications.                           - Repeat colonoscopy in 10 years for screening                            purposes. Procedure Code(s):        --- Professional ---                           [phone], Colonoscopy, flexible; diagnostic, including                            collection of specimen(s) by brushing or washing,                            when  performed (separate procedure) Diagnosis Code(s):        --- Professional ---                           D50.9, Iron deficiency anemia, unspecified CPT copyright 2016 American Medical Association. All rights reserved. The codes documented in this report are preliminary and upon coder review may  be revised to meet current compliance requirements. Missy Sabins, MD 11/30/2015 9:36:22 AM This report has been signed electronically. Number of Addenda: 0

## 2015-11-30 NOTE — Progress Notes (Signed)
Johnella Moloney Dubose to be D/C'd home per MD order.  Discussed with the patient and all questions fully answered.  VSS, Skin clean, dry and intact without evidence of skin break down, no evidence of skin tears noted. IV catheter discontinued intact. Site without signs and symptoms of complications. Dressing and pressure applied.  An After Visit Summary was printed and given to the patient. Patient received prescription.  D/c education completed with patient/family including follow up instructions, medication list, d/c activities limitations if indicated, with other d/c instructions as indicated by MD - patient able to verbalize understanding, all questions fully answered.   Patient instructed to return to ED, call 911, or call MD for any changes in condition.   Patient escorted via [employer], and D/C home via private auto.  Morley Kos Price 11/30/2015 2:44 PM

## 2015-11-30 NOTE — Progress Notes (Signed)
   Subjective: Patient was feeling much better this morning denies any shortness of breath,. Had his EGD and colonoscopy this morning which is normal. He tolerated the procedure very well.  Objective:  Vital signs in last 24 hours: Vitals:   11/30/15 0818 11/30/15 0936 11/30/15 0945 11/30/15 0958  BP: 113/79 106/63 (!) 113/58 (!) 111/53  Pulse: 68 80 75 76  Resp: '18 14 19 13  '$ Temp: 97.7 F (36.5 C)  97.7 F (36.5 C)   TempSrc: Oral  Oral   SpO2: 100% 100% 99% 99%  Weight:      Height:       Gen. well-developed, lean man, lying comfortably in bed, in no acute distress. Eyes. Conjunctival pallor improved as compared to yesterday after transfusion. Lungs. Clear bilaterally CV. Regular rate and rhythm. Abdomen. Soft, nontender, nondistended, bowel sounds positive. Extremities. No edema, no cyanosis, pulses 2+ bilaterally.  Labs. CBC Latest Ref Rng & Units 11/30/2015 11/29/2015 11/29/2015  WBC 4.0 - 10.5 K/uL 6.1 - 7.9  Hemoglobin 13.0 - 17.0 g/dL 8.0(L) 8.4(L) 7.0(L)  Hematocrit 39.0 - 52.0 % 26.1(L) 26.9(L) 22.9(L)  Platelets 150 - 400 K/uL 434(H) - 457(H)    Assessment/Plan:  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  Symptomatic anemia.he had his EGD and colonoscopy today, which were completely normal. GI will consider capsule endoscopy if needed in the future. Repeat CBC today have Hgb of 8.0. He will be discharged home on iron supplement, and F/U in clinic next week.  Diabetes.He has well-controlled diabetes with recent A1c  done on November 3 was 6.4. His CBGs stays between 113- 146 over last 24 hours. -Continue sliding scale.  Adenocarcinoma of the lung.S/P right upper lobe lobectomy on 10/18/2015.His recent chest x-ray and CTA done on November 3 was normal except mild right-sided pleural effusion. He has stage 1A adenocarcinoma of lung. He is following with oncology.  Dispo: Being discharged today.  Lorella Nimrod, MD 11/30/2015, 11:55 AM Pager: [phone]

## 2015-11-30 NOTE — Anesthesia Postprocedure Evaluation (Signed)
Anesthesia Post Note  Patient: Brett [name]  Procedure(s) Performed: Procedure(s) (LRB): ESOPHAGOGASTRODUODENOSCOPY (EGD) (N/A) COLONOSCOPY (N/A)  Patient location during evaluation: PACU Anesthesia Type: MAC Level of consciousness: awake and alert Pain management: pain level controlled Vital Signs Assessment: post-procedure vital signs reviewed and stable Respiratory status: spontaneous breathing, nonlabored ventilation and respiratory function stable Cardiovascular status: stable and blood pressure returned to baseline Anesthetic complications: no    Last Vitals:  Vitals:   11/30/15 0945 11/30/15 0958  BP: (!) 113/58 (!) 111/53  Pulse: 75 76  Resp: 19 13  Temp: 36.5 C     Last Pain:  Vitals:   11/30/15 0945  TempSrc: Oral  PainSc:                  Izic Stfort,W. EDMOND

## 2015-11-30 NOTE — Op Note (Signed)
[facility] Patient Name: Brett [name] Procedure Date : 11/30/2015 MRN: [id number] Attending MD: Missy Sabins , MD Date of Birth: 03-May-1960 CSN: [id number] Age: 55 Admit Type: Inpatient Procedure:                Upper GI endoscopy Indications:              Iron deficiency anemia Providers:                Elyse Jarvis. Amedeo Plenty, MD, [name], RN, Ralene Bathe,                            Technician, [place] Beckner, CRNA Referring MD:              Medicines:                Propofol per Anesthesia Complications:            No immediate complications. Estimated Blood Loss:     Estimated blood loss: none. Procedure:                Pre-Anesthesia Assessment:                           - Prior to the procedure, a History and Physical                            was performed, and patient medications and                            allergies were reviewed. The patient's tolerance of                            previous anesthesia was also reviewed. The risks                            and benefits of the procedure and the sedation                            options and risks were discussed with the patient.                            All questions were answered, and informed consent                            was obtained. Prior Anticoagulants: The patient has                            taken no previous anticoagulant or antiplatelet                            agents. ASA Grade Assessment: II - A patient with                            mild systemic disease. After reviewing the risks  and benefits, the patient was deemed in                            satisfactory condition to undergo the procedure.                           After obtaining informed consent, the endoscope was                            passed under direct vision. Throughout the                            procedure, the patient's blood pressure, pulse, and                            oxygen  saturations were monitored continuously. The                            [id number] ([id number]) scope was introduced through the                            mouth, and advanced to the second part of duodenum.                            The upper GI endoscopy was accomplished without                            difficulty. The patient tolerated the procedure                            well. Scope In: Scope Out: Findings:      The esophagus was normal.      The stomach was normal.      The examined duodenum was normal. Impression:               - Normal esophagus.                           - Normal stomach.                           - Normal examined duodenum.                           - No specimens collected. Moderate Sedation:      no moderate sedation Recommendation:           - Perform a colonoscopy today.                           - Resume previous diet.                           - Continue present medications. Procedure Code(s):        --- Professional ---  20813, Esophagogastroduodenoscopy, flexible,                            transoral; diagnostic, including collection of                            specimen(s) by brushing or washing, when performed                            (separate procedure) Diagnosis Code(s):        --- Professional ---                           D50.9, Iron deficiency anemia, unspecified CPT copyright 2016 American Medical Association. All rights reserved. The codes documented in this report are preliminary and upon coder review may  be revised to meet current compliance requirements. Missy Sabins, MD 11/30/2015 9:13:58 AM This report has been signed electronically. Number of Addenda: 0

## 2015-11-30 NOTE — Progress Notes (Addendum)
  Date: 11/30/2015  Patient name: Brett [name]  Medical record number: [id number]  Date of birth: 1960-03-28   This patient's plan of care was discussed with the house staff. Please see their note for complete details. I concur with their findings. Without complaints.  Vitals:   11/30/15 0945 11/30/15 0958  BP: (!) 113/58 (!) 111/53  Pulse: 75 76  Resp: 19 13  Temp: 97.7 F (36.5 C)    CV- rrr Chest- cta abd- BS+, soft, nontender.   Lab:  Hgb pending  A/P Acute Anemia His endoscopy is negative.  His hgb has been stable, will recheck today.  Start iron hopefully home soon.   Lung Ca Will f/u with Dr Earlie Server this month. Prev stage 1a (T1b, N0) Campbell Riches, MD 11/30/2015, 11:25 AM

## 2015-11-30 NOTE — Progress Notes (Signed)
[facility] Progress Note  Subjective: Tolerated bowel prep with no new complaints  Objective: Vital signs in last 24 hours: Temp:  [97.7 F (36.5 C)-98.4 F (36.9 C)] 97.7 F (36.5 C) (11/09 0818) Pulse Rate:  [59-72] 68 (11/09 0818) Resp:  [16-18] 18 (11/09 0818) BP: (107-118)/(62-79) 113/79 (11/09 0818) SpO2:  [100 %] 100 % (11/09 0818) Weight change:    PE: Unchanged  Lab Results: Results for orders placed or performed during the hospital encounter of 11/28/15 (from the past 24 hour(s))  Glucose, capillary     Status: Abnormal   Collection Time: 11/29/15 11:41 AM  Result Value Ref Range   Glucose-Capillary 174 (H) 65 - 99 mg/dL  Glucose, capillary     Status: Abnormal   Collection Time: 11/29/15  4:25 PM  Result Value Ref Range   Glucose-Capillary 130 (H) 65 - 99 mg/dL  Glucose, capillary     Status: Abnormal   Collection Time: 11/29/15  9:32 PM  Result Value Ref Range   Glucose-Capillary 178 (H) 65 - 99 mg/dL  Glucose, capillary     Status: Abnormal   Collection Time: 11/30/15 12:55 AM  Result Value Ref Range   Glucose-Capillary 116 (H) 65 - 99 mg/dL  Glucose, capillary     Status: Abnormal   Collection Time: 11/30/15  4:43 AM  Result Value Ref Range   Glucose-Capillary 129 (H) 65 - 99 mg/dL  Glucose, capillary     Status: Abnormal   Collection Time: 11/30/15  8:20 AM  Result Value Ref Range   Glucose-Capillary 134 (H) 65 - 99 mg/dL    Studies/Results: No results found.    Assessment: Anemia, heme positive stools. EGD and colonoscopy completely normal.  Plan: Advance diet, discharge when stable. Would reserve capsule endoscopy for ongoing future drop in hemoglobin associated with heme positive stool. A course of PPI to cover any missed peptic ulcer disease for 2 months would be appropriate. We'll sign off for now.    Braniya Farrugia C 11/30/2015, 9:39 AM  Pager [phone] If no answer or after 5 PM call [phone]

## 2015-11-30 NOTE — Assessment & Plan Note (Addendum)
Assessment:  Shortness of breath Patient had a clinic visit on 11/3 for shortness of breath.  A stat PA and lateral chest x-ray revealed no evidence of infiltrate or right-sided pneumothorax.  A CT angiogram was negative for evidence of PE or other causes for his acute dyspnea.    Today patient continues to have shortness of breath that continues to worsen.  We will get a CBC assessing for anemia.  We will also order an Echo and cardiac monitor to assess for cardiac causes for his episodes of shortness of breath and diaphoresis.  In office EKG was preformed and showed sinus tachycardia with no ST elevations or T wave depression.   Plan - CBC - ECHO - Cardiac monitor - EKG  Addendum Critical value returned with Hgb 6.1.  Patient's wife was contacted and given the results.  I let her know that her husband needed to be admitted for blood transfusion.  She stated understanding and said she would return with her husband to the hospital.   I added an iron study and ferritin to the previously collected blood.

## 2015-11-30 NOTE — Transfer of Care (Signed)
Immediate Anesthesia Transfer of Care Note  Patient: Brett [name]  Procedure(s) Performed: Procedure(s): ESOPHAGOGASTRODUODENOSCOPY (EGD) (N/A) COLONOSCOPY (N/A)  Patient Location: Endoscopy Unit  Anesthesia Type:MAC  Level of Consciousness: awake, alert  and oriented  Airway & Oxygen Therapy: Patient Spontanous Breathing and Patient connected to nasal cannula oxygen  Post-op Assessment: Report given to RN, Post -op Vital signs reviewed and stable and Patient moving all extremities X 4  Post vital signs: Reviewed and stable  Last Vitals:  Vitals:   11/30/15 0446 11/30/15 0818  BP: 107/64 113/79  Pulse: 70 68  Resp: 16 18  Temp: 36.7 C 36.5 C    Last Pain:  Vitals:   11/30/15 0818  TempSrc: Oral  PainSc: 3          Complications: No apparent anesthesia complications

## 2015-11-30 NOTE — Progress Notes (Signed)
CBG completed, but not transferring to Epic. CBG 178, 2 units of insulin given.  At 0055 CBG 116, no insulin given. At 0443 CBG 129, 1 unit of insulin given.

## 2015-11-30 NOTE — Anesthesia Procedure Notes (Signed)
Procedure Name: MAC Date/Time: 11/30/2015 8:59 AM Performed by: Mariea Clonts Pre-anesthesia Checklist: Patient identified, Emergency Drugs available, Suction available, Patient being monitored and Timeout performed Patient Re-evaluated:Patient Re-evaluated prior to inductionOxygen Delivery Method: Nasal cannula

## 2015-11-30 NOTE — Discharge Summary (Signed)
Name: BRAIDYN SCORSONE MRN: [id number] DOB: 02/09/1960 55 y.o. PCP: [name], MD  Date of Admission: 11/28/2015  4:35 PM Date of Discharge: 11/30/2015 Attending Physician: No att. providers found  Discharge Diagnosis: 1. Symptomatic anemia. 2. Type 2 diabetes. 3. Primary lung adenocarcinoma    Discharge Medications:   Medication List    TAKE these medications   aspirin EC 81 MG tablet Take 81 mg by mouth daily.   atorvastatin 40 MG tablet Commonly known as:  LIPITOR Take 1 tablet (40 mg total) by mouth daily.   clopidogrel 75 MG tablet Commonly known as:  PLAVIX Take 75 mg by mouth daily with breakfast.   ferrous sulfate 325 (65 FE) MG tablet Take 1 tablet (325 mg total) by mouth 2 (two) times daily with a meal.   glipiZIDE 10 MG tablet Commonly known as:  GLUCOTROL Take 2 tablets (20 mg total) by mouth 2 (two) times daily before a meal.   HYDROcodone-acetaminophen 10-325 MG tablet Commonly known as:  NORCO Take 1 tablet by mouth every 6 (six) hours as needed for severe pain.   lisinopril 5 MG tablet Commonly known as:  PRINIVIL,ZESTRIL Take 1 tablet (5 mg total) by mouth daily.   metFORMIN 500 MG tablet Commonly known as:  GLUCOPHAGE Take 1 tablet (500 mg total) by mouth 2 (two) times daily with a meal.   pantoprazole 40 MG tablet Commonly known as:  PROTONIX Take 1 tablet (40 mg total) by mouth daily.   prochlorperazine 10 MG tablet Commonly known as:  COMPAZINE Take 1 tablet (10 mg total) by mouth every 6 (six) hours as needed for nausea or vomiting.   sertraline 25 MG tablet Commonly known as:  ZOLOFT Take 1 tablet (25 mg total) by mouth daily.   sitaGLIPtin 100 MG tablet Commonly known as:  JANUVIA Take 1 tablet (100 mg total) by mouth daily.       Disposition and follow-up:   Mr.Jabir W Dault was discharged from [facility] in Good condition.  At the hospital follow up visit please address:  1.  His shortness of breath,  any reoccurrence of dark stool or hematochezia. If you continue to have a drop in his hemoglobin, he might need further evaluation by a  Hematologist.  2.  Labs / imaging needed at time of follow-up: CBC  3.  Pending labs/ test needing follow-up: None  Follow-up Appointments: Follow-up Information    [name], MD Follow up on 12/07/2015.   Specialty:  Internal Medicine Why:  At 9:45 AM Contact information: [address] [place] [address] [phone]           Hospital Course by problem list:  Mr. Burleigh, [age] y.o man,with past medical history significant for diabetes, small cell lung carcinoma,had lobectomy on 10/18/2015,Barrett esophagus,traumatic brain injury causing cognitive impairment and peripheralvascular disease,requiring stent placement in left lower extremity and right carotid,came to internal medicine clinic on tuesday with complaint of worsening shortness of breath and found to have hemoglobin of 6.1,was advised to come to Hospital for further evaluation and management.  Patient complaint of worsening shortness of breath, nausea and fatigued for about 2 weeks. He states that he has this shortness of breath since his surgery at the end of September, really get worse for the last few days to the point that he is unable to walk to bathroom without being dizzy and short of breath. He was recently seen on Friday because of shortness of breath, his  CTA and chest x-ray was negative for any pulmonary embolism, only a small right pleural effusion was identified.  Patient complaint of mild epigastric discomfort and early satiety since his surgery. He do endorses having some dark-colored stools over the last week. Denies any hematochezia or hematuria. Denies any hematemesis.  1. Symptomatic anemia. He was given 2 units of packed RBCs, which improved his hemoglobin to 8.4. His shortness of breath improved and he was feeling much better overall. On further  investigation was found to have iron deficiency anemia,  had his EGD and colonoscopy on October 9. Both were normal. GI commented that he might consider capsule endoscopy in the future if he ever had stools positive FOBT. He told us that he never had a big appetite, and occasionally eats vegetables and fruits. He was discharged home on iron supplement, advised to increase intake of green leafy vegetables, apples and beats.  2.Diabetes. He has well-controlled diabetes with recent A1c done on November 3 was 6.4. He is on glipizide 20 mg twice daily, metformin 500 mg twice daily and Januvia 100 mg daily at home. He was discharge on his home regimen.  3.Adenocarcinoma of the lung.S/P right upper lobe lobectomy on 10/18/2015. His recent chest x-ray and CTA done on November 3 was normal except mild right-sided pleural effusion. He has stage 1A adenocarcinoma of lung. He was advised to follow up with his oncologist.  Discharge Vitals:   BP (!) 111/53   Pulse 76   Temp 97.7 F (36.5 C) (Oral)   Resp 13   Ht '5\' 7"'$  (1.702 m)   Wt 127 lb 12.8 oz (58 kg)   SpO2 99%   BMI 20.02 kg/m   Gen. well-developed, lean man, lying comfortably in bed, in no acute distress. Eyes. Conjunctival pallor improved as compared to yesterday after transfusion. Lungs.Clear bilaterally CV. Regular rate and rhythm. Abdomen.Soft, nontender, nondistended, bowel sounds positive. Extremities.No edema, no cyanosis, pulses 2+ bilaterally.  Pertinent Labs, Studies, and Procedures:  CBC Latest Ref Rng & Units 11/30/2015 11/29/2015 11/29/2015  WBC 4.0 - 10.5 K/uL 6.1 - 7.9  Hemoglobin 13.0 - 17.0 g/dL 8.0(L) 8.4(L) 7.0(L)  Hematocrit 39.0 - 52.0 % 26.1(L) 26.9(L) 22.9(L)  Platelets 150 - 400 K/uL 434(H) - 457(H)   BMP Latest Ref Rng & Units 11/29/2015 11/24/2015 10/21/2015  Glucose 65 - 99 mg/dL 120(H) 220(H) 144(H)  BUN 6 - 20 mg/dL '10 13 7  '$ Creatinine 0.61 - 1.24 mg/dL 0.67 0.83 0.64  BUN/Creat Ratio 9 - 20 - - -  Sodium  135 - 145 mmol/L 139 135 138  Potassium 3.5 - 5.1 mmol/L 3.8 4.7 3.8  Chloride 101 - 111 mmol/L 109 102 105  CO2 22 - 32 mmol/L '22 23 26  '$ Calcium 8.9 - 10.3 mg/dL 8.3(L) 9.2 8.7(L)   Iron/TIBC/Ferritin/ %Sat    Component Value Date/Time   IRON 12 (L) 11/28/2015 1206   TIBC 391 11/28/2015 1206   FERRITIN 15 (L) 11/28/2015 1206   IRONPCTSAT 3 (LL) 11/28/2015 1206   Trop.  <0.03 X 3  A1c. 6.4  EKG: Shows sinus tachycardia, heart rate 101.  CXR: FINDINGS: Heart size is normal. Changes of previous lobectomy on the right. No evidence of residual pleural air. Evidence of pulmonary resection in the lateral aspect of the remaining right lung. Left chest is clear. No infiltrate, collapse or effusion.  IMPRESSION: Good appearance following right lobectomy. No active disease presently. No residual pneumothorax.  CTA.  FINDINGS: Cardiovascular: Satisfactory opacification of the pulmonary  arteries to the segmental level. No evidence of pulmonary embolism. Normal heart size. No pericardial effusion.  Mediastinum/Nodes: No enlarged mediastinal, hilar, or axillary lymph nodes. Thyroid gland, trachea, and esophagus demonstrate no significant findings.  Lungs/Pleura: Small right pleural effusion. Postoperative change from right upper lobectomy identified. No complicating features identified.  Upper Abdomen: No acute abnormality.  Musculoskeletal: No chest wall abnormality. No acute or significant osseous findings.  Review of the MIP images confirms the above findings.  IMPRESSION: 1. No evidence for acute pulmonary embolus. 2. Postoperative appearance of the right lung compatible with recent right upper lobectomy. No complicating features identified. 3. Small right pleural effusion.  EGD. Findings: The Esophagus was normal. The stomach was normal. The examined duodenum was normal. Impression: - Normal esophagus. - Normal stomach. - Normal examined duodenum. - No  specimens collected.  Colonoscopy. Impression: - Preparation of the colon was fair. - The entire examined colon is normal on direct and retroflexion views. - No specimens collected.  Discharge Instructions: Discharge Instructions    Diet - low sodium heart healthy    Complete by:  As directed    Discharge instructions    Complete by:  As directed    It was pleasure taking care of you. Please go to internal medicine Center at [facility] on November 16 at 9:45 AM for your follow-up. You need to have your hemoglobin checked at that time. Please take your iron supplement as directed, occasionally it can cause upset stomach or constipation, take it with food, you can try some Colace or MiraLAX if become constipated. If you ever noticed any dark stools seek immediate medical attention.  Try to add some green leafy vegetables for example spinach and broccoli, apple and beats in your regular diet, as these foods are rich in iron. Please follow-up with your oncologist as directed.   Increase activity slowly    Complete by:  As directed       Signed: Lorella Nimrod, MD 11/30/2015, 3:38 PM   Pager: [phone]

## 2015-12-01 DIAGNOSIS — D509 Iron deficiency anemia, unspecified: Secondary | ICD-10-CM | POA: Diagnosis not present

## 2015-12-01 NOTE — Progress Notes (Signed)
Internal Medicine Clinic Attending  I saw and evaluated the patient.  I personally confirmed the key portions of the history and exam documented by Dr. Hoffman and I reviewed pertinent patient test results.  The assessment, diagnosis, and plan were formulated together and I agree with the documentation in the resident's note.      

## 2015-12-04 ENCOUNTER — Ambulatory Visit: Payer: Self-pay | Admitting: Internal Medicine

## 2015-12-04 ENCOUNTER — Telehealth: Payer: Self-pay | Admitting: Internal Medicine

## 2015-12-04 ENCOUNTER — Telehealth: Payer: Self-pay | Admitting: *Deleted

## 2015-12-04 DIAGNOSIS — C3491 Malignant neoplasm of unspecified part of right bronchus or lung: Secondary | ICD-10-CM

## 2015-12-04 NOTE — Telephone Encounter (Addendum)
Does not feel good to come in - sent message to r/s to thursday

## 2015-12-04 NOTE — Telephone Encounter (Signed)
lvm to inform pt of r/s lab/ov appt to 11/16 per LOS. Gave new appt date/time

## 2015-12-04 NOTE — Telephone Encounter (Signed)
Call received from patient's wife stating that patient has a "cold" and is not feeling up to coming in for appt with Dr. Julien Nordmann this afternoon.  Patient's wife requests appointment this Thursday if possible around his current [place] appointment.  Message forwarded to Dr. Worthy Flank desk nurse.

## 2015-12-07 ENCOUNTER — Other Ambulatory Visit: Payer: Self-pay

## 2015-12-07 ENCOUNTER — Ambulatory Visit: Payer: Self-pay | Admitting: Internal Medicine

## 2015-12-07 ENCOUNTER — Encounter: Payer: Self-pay | Admitting: Internal Medicine

## 2015-12-07 ENCOUNTER — Other Ambulatory Visit: Payer: Self-pay | Admitting: Internal Medicine

## 2015-12-07 ENCOUNTER — Ambulatory Visit (INDEPENDENT_AMBULATORY_CARE_PROVIDER_SITE_OTHER): Payer: Medicare Other | Admitting: Internal Medicine

## 2015-12-07 ENCOUNTER — Ambulatory Visit (HOSPITAL_COMMUNITY)
Admission: RE | Admit: 2015-12-07 | Discharge: 2015-12-07 | Disposition: A | Discharge status: Home / Self Care | Payer: Medicare Other | Source: Ambulatory Visit | Attending: Internal Medicine | Admitting: Internal Medicine

## 2015-12-07 ENCOUNTER — Telehealth: Payer: Self-pay | Admitting: Internal Medicine

## 2015-12-07 ENCOUNTER — Observation Stay (HOSPITAL_COMMUNITY)
Admission: AD | Admit: 2015-12-07 | Discharge: 2015-12-08 | Disposition: A | Discharge status: Home / Self Care | Payer: Medicare Other | Source: Ambulatory Visit | Attending: Oncology | Admitting: Oncology

## 2015-12-07 ENCOUNTER — Encounter (HOSPITAL_COMMUNITY): Payer: Self-pay | Admitting: General Practice

## 2015-12-07 VITALS — BP 86/58 | HR 107 | Temp 97.5°F | Ht 67.0 in | Wt 130.2 lb

## 2015-12-07 DIAGNOSIS — Z87820 Personal history of traumatic brain injury: Secondary | ICD-10-CM | POA: Insufficient documentation

## 2015-12-07 DIAGNOSIS — E1151 Type 2 diabetes mellitus with diabetic peripheral angiopathy without gangrene: Secondary | ICD-10-CM | POA: Insufficient documentation

## 2015-12-07 DIAGNOSIS — R0602 Shortness of breath: Secondary | ICD-10-CM

## 2015-12-07 DIAGNOSIS — F329 Major depressive disorder, single episode, unspecified: Secondary | ICD-10-CM | POA: Insufficient documentation

## 2015-12-07 DIAGNOSIS — E785 Hyperlipidemia, unspecified: Secondary | ICD-10-CM | POA: Diagnosis not present

## 2015-12-07 DIAGNOSIS — D72829 Elevated white blood cell count, unspecified: Secondary | ICD-10-CM

## 2015-12-07 DIAGNOSIS — D500 Iron deficiency anemia secondary to blood loss (chronic): Principal | ICD-10-CM | POA: Insufficient documentation

## 2015-12-07 DIAGNOSIS — Z87891 Personal history of nicotine dependence: Secondary | ICD-10-CM | POA: Diagnosis not present

## 2015-12-07 DIAGNOSIS — Z7902 Long term (current) use of antithrombotics/antiplatelets: Secondary | ICD-10-CM | POA: Diagnosis not present

## 2015-12-07 DIAGNOSIS — Z95820 Peripheral vascular angioplasty status with implants and grafts: Secondary | ICD-10-CM | POA: Diagnosis not present

## 2015-12-07 DIAGNOSIS — Z79899 Other long term (current) drug therapy: Secondary | ICD-10-CM | POA: Insufficient documentation

## 2015-12-07 DIAGNOSIS — Z7984 Long term (current) use of oral hypoglycemic drugs: Secondary | ICD-10-CM | POA: Diagnosis not present

## 2015-12-07 DIAGNOSIS — Z902 Acquired absence of lung [part of]: Secondary | ICD-10-CM | POA: Insufficient documentation

## 2015-12-07 DIAGNOSIS — Z7982 Long term (current) use of aspirin: Secondary | ICD-10-CM | POA: Diagnosis not present

## 2015-12-07 DIAGNOSIS — J069 Acute upper respiratory infection, unspecified: Secondary | ICD-10-CM | POA: Diagnosis not present

## 2015-12-07 DIAGNOSIS — E1159 Type 2 diabetes mellitus with other circulatory complications: Secondary | ICD-10-CM

## 2015-12-07 DIAGNOSIS — M797 Fibromyalgia: Secondary | ICD-10-CM | POA: Insufficient documentation

## 2015-12-07 DIAGNOSIS — Z85118 Personal history of other malignant neoplasm of bronchus and lung: Secondary | ICD-10-CM | POA: Insufficient documentation

## 2015-12-07 DIAGNOSIS — I959 Hypotension, unspecified: Secondary | ICD-10-CM

## 2015-12-07 DIAGNOSIS — R000 Tachycardia, unspecified: Secondary | ICD-10-CM

## 2015-12-07 DIAGNOSIS — I951 Orthostatic hypotension: Secondary | ICD-10-CM | POA: Insufficient documentation

## 2015-12-07 DIAGNOSIS — R05 Cough: Secondary | ICD-10-CM | POA: Diagnosis not present

## 2015-12-07 LAB — COMPREHENSIVE METABOLIC PANEL
ALT: 11 U/L — ABNORMAL LOW (ref 17–63)
AST: 16 U/L (ref 15–41)
Albumin: 3.3 g/dL — ABNORMAL LOW (ref 3.5–5.0)
Alkaline Phosphatase: 79 U/L (ref 38–126)
Anion gap: 9 (ref 5–15)
BUN: 14 mg/dL (ref 6–20)
CO2: 23 mmol/L (ref 22–32)
Calcium: 9.1 mg/dL (ref 8.9–10.3)
Chloride: 101 mmol/L (ref 101–111)
Creatinine, Ser: 0.85 mg/dL (ref 0.61–1.24)
GFR calc Af Amer: >60 mL/min (ref >60)
GFR calc non Af Amer: >60 mL/min (ref >60)
Glucose, Bld: 275 mg/dL — ABNORMAL HIGH (ref 65–99)
Potassium: 4.6 mmol/L (ref 3.5–5.1)
Sodium: 133 mmol/L — ABNORMAL LOW (ref 135–145)
Total Bilirubin: 0.3 mg/dL (ref 0.3–1.2)
Total Protein: 6.6 g/dL (ref 6.5–8.1)

## 2015-12-07 LAB — CBC WITH DIFFERENTIAL/PLATELET
Basophils Absolute: 0 10*3/uL (ref 0.0–0.1)
Basophils Relative: 0 %
Eosinophils Absolute: 0.2 10*3/uL (ref 0.0–0.7)
Eosinophils Relative: 2 %
HCT: 26.8 % — ABNORMAL LOW (ref 39.0–52.0)
Hemoglobin: 7.9 g/dL — ABNORMAL LOW (ref 13.0–17.0)
Lymphocytes Relative: 11 %
Lymphs Abs: 1.3 10*3/uL (ref 0.7–4.0)
MCH: 23.5 pg — ABNORMAL LOW (ref 26.0–34.0)
MCHC: 29.5 g/dL — ABNORMAL LOW (ref 30.0–36.0)
MCV: 79.8 fL (ref 78.0–100.0)
Monocytes Absolute: 0.7 10*3/uL (ref 0.1–1.0)
Monocytes Relative: 6 %
Neutro Abs: 9.2 10*3/uL — ABNORMAL HIGH (ref 1.7–7.7)
Neutrophils Relative %: 81 %
Platelets: 574 10*3/uL — ABNORMAL HIGH (ref 150–400)
RBC: 3.36 MIL/uL — ABNORMAL LOW (ref 4.22–5.81)
RDW: 16.1 % — ABNORMAL HIGH (ref 11.5–15.5)
WBC: 11.4 10*3/uL — ABNORMAL HIGH (ref 4.0–10.5)

## 2015-12-07 LAB — TROPONIN I
Troponin I: <0.03 ng/mL (ref <0.03)
Troponin I: <0.03 ng/mL (ref <0.03)

## 2015-12-07 MED ORDER — SERTRALINE HCL 50 MG PO TABS
25.0000 mg | ORAL_TABLET | Freq: Every day | ORAL | Status: DC
  Administered 2015-12-07 – 2015-12-08 (×2): 25 mg via ORAL
  Filled 2015-12-07 (×2): qty 1

## 2015-12-07 MED ORDER — SODIUM CHLORIDE 0.9% FLUSH
3.0000 mL | Freq: Two times a day (BID) | INTRAVENOUS | Status: DC
  Administered 2015-12-07: 3 mL via INTRAVENOUS

## 2015-12-07 MED ORDER — CLOPIDOGREL BISULFATE 75 MG PO TABS
75.0000 mg | ORAL_TABLET | Freq: Every day | ORAL | Status: DC
  Administered 2015-12-08: 75 mg via ORAL
  Filled 2015-12-07: qty 1

## 2015-12-07 MED ORDER — SODIUM CHLORIDE 0.9 % IV BOLUS (SEPSIS)
1000.0000 mL | Freq: Once | INTRAVENOUS | Status: AC
  Administered 2015-12-07: 1000 mL via INTRAVENOUS

## 2015-12-07 MED ORDER — ENOXAPARIN SODIUM 40 MG/0.4ML ~~LOC~~ SOLN: 40.0000 mg | SUBCUTANEOUS | Status: DC

## 2015-12-07 MED ORDER — ONDANSETRON HCL 4 MG PO TABS: 4.0000 mg | ORAL_TABLET | Freq: Four times a day (QID) | ORAL | Status: DC | PRN

## 2015-12-07 MED ORDER — ONDANSETRON HCL 4 MG/2ML IJ SOLN: 4.0000 mg | Freq: Four times a day (QID) | INTRAMUSCULAR | Status: DC | PRN

## 2015-12-07 MED ORDER — ENSURE ENLIVE PO LIQD
237.0000 mL | Freq: Two times a day (BID) | ORAL | Status: DC
  Administered 2015-12-07 – 2015-12-08 (×3): 237 mL via ORAL

## 2015-12-07 MED ORDER — ACETAMINOPHEN 650 MG RE SUPP: 650.0000 mg | Freq: Four times a day (QID) | RECTAL | Status: DC | PRN

## 2015-12-07 MED ORDER — ATORVASTATIN CALCIUM 40 MG PO TABS
40.0000 mg | ORAL_TABLET | Freq: Every day | ORAL | Status: DC
  Administered 2015-12-07 – 2015-12-08 (×2): 40 mg via ORAL
  Filled 2015-12-07 (×2): qty 1

## 2015-12-07 MED ORDER — DEXTROSE-NACL 5-0.45 % IV SOLN
INTRAVENOUS | Status: AC
  Administered 2015-12-07: 18:00:00 via INTRAVENOUS

## 2015-12-07 MED ORDER — ACETAMINOPHEN 325 MG PO TABS: 650.0000 mg | ORAL_TABLET | Freq: Four times a day (QID) | ORAL | Status: DC | PRN

## 2015-12-07 MED ORDER — SENNA 8.6 MG PO TABS
1.0000 | ORAL_TABLET | Freq: Two times a day (BID) | ORAL | Status: DC
  Administered 2015-12-08: 8.6 mg via ORAL
  Filled 2015-12-07 (×2): qty 1

## 2015-12-07 MED ORDER — PANTOPRAZOLE SODIUM 40 MG PO TBEC
40.0000 mg | DELAYED_RELEASE_TABLET | Freq: Every day | ORAL | Status: DC
  Administered 2015-12-07 – 2015-12-08 (×2): 40 mg via ORAL
  Filled 2015-12-07 (×2): qty 1

## 2015-12-07 MED ORDER — ASPIRIN EC 81 MG PO TBEC
81.0000 mg | DELAYED_RELEASE_TABLET | Freq: Every day | ORAL | Status: DC
  Administered 2015-12-07 – 2015-12-08 (×2): 81 mg via ORAL
  Filled 2015-12-07 (×2): qty 1

## 2015-12-07 NOTE — Progress Notes (Signed)
CC: Hospital follow up for symptomatic anemia  HPI:  [name] is a 55 y.o. man with PMHx as noted below who presents today for hospital follow up of his symptomatic anemia.  He was initially seen in clinic on 11/3 when he complained of shortness of breath. CXR and CTA chest were performed at that time which were negative for pneumonia, pneumothorax, PE, or any other acute process. He was subsequently seen in clinic on 11/7 for worsening shortness of breath and a presyncopal episode. He was found to have a Hgb of 6.1 and then admitted to the hospital for symptomatic anemia. He received 2 units PRBCs. He underwent EGD and colonoscopy which were negative for any GI bleeding. Work up revealed iron deficiency anemia and he was started on oral iron therapy. His Hgb improved to 8.0 by time of discharge and his SOB had improved.   Today, he reports worsening shortness of breath that started a few days ago. He describes he took a shower this morning and felt exhausted. He is having difficulty ambulating due to shortness of breath and could not walk to the clinic appointment this morning. He notes a new dry cough and mild epigastric abdominal pain. Denies fevers, chills, chest pain, dysuria, hematuria, melena or hematochezia. He reports dizziness with standing. He has been taking his oral iron since hospital discharge.  Past Medical History:  Diagnosis Date  . Anemia 11/28/2015  . Anxiety   . Arthritis    "hands, back" (11/28/2015)  . Barrett's esophagus 07/01/2013   Without dysplasia on biopsy 09/03/2012. Repeat EGD recommended 08/2015  . Carotid artery stenosis 07/01/2013   Requiring right sided stent   . Chronic pain syndrome 07/01/2013  . Closed head injury with brief loss of consciousness ([place]) 07/22/2010   Head trapped in a hydraulic device at work.  Fracture of orbital bones on right and brief loss of consciousness per report.  . Cognitive disorder 04/15/2011   Neuropsychological evaluation  (03/2010):  Identified a number of problem areas including cognitive and psychiatric symptoms following a TBI in July 2012. There was likely a strong psycho-social overlay in regard to the cognitive deficits in the form of mood disorder with psychotic features and mixed anxiety symptomatology. His primary tested cognitive deficits are in the areas of attention, executi  . Daily headache "since 07/2010"   constantly  . Degenerative joint disease of cervical spine 07/01/2013  . Dupuytren's contracture of both hands 04/08/2014  . Encounter for antineoplastic chemotherapy 10/05/2015  . Erectile dysfunction associated with type 2 diabetes mellitus ([place]) 07/01/2013  . Fibromyalgia 07/01/2013  . History of blood transfusion 11/28/2015   "suppose to get his first today" (11/28/2015)  . Hyperlipidemia LDL goal < 100 07/01/2013  . Memory changes    "memory issues" from head injury  . Osteoarthritis of right thumb 10/21/2014  . Peripheral vascular occlusive disease ([place]) 07/01/2013   Requiring 2 arterial stents above the left knee per report  . Pneumonia ~ 2006/2007  . Post traumatic stress disorder 07/01/2013  . Primary lung adenocarcinoma ([place]) dx'd 08/2015   "right lung"  . Severe major depression with psychotic features ([place]) 04/15/2011  . Tobacco abuse 07/01/2013  . Tobacco abuse   . Type 2 diabetes mellitus with vascular disease ([place]) 07/01/2013   Left lower extremity and right carotid stenting    Review of Systems:  All negative except per HPI  Physical Exam:  Vitals:   12/07/15 1011  BP: (!) 87/60  Pulse: Marland Kitchen)  103  Temp: 97.5 F (36.4 C)  TempSrc: Oral  SpO2: 100%  Weight: 130 lb 3.2 oz (59.1 kg)  Height: '5\' 7"'$  (1.702 m)   General: thin man sitting up in chair, in mild respiratory distress HEENT: [place]/AT, EOMI, sclera anicteric, conjunctiva pale, mucus membranes moist CV: tachycardic in 100s, no m/g/r Pulm: CTA bilaterally, breaths mildly labored on room air Abd: BS+, soft, non-distended,  some tenderness in the epigastrium Ext: warm, no peripheral edema  Neuro: alert and oriented x 3  Assessment & Plan:   See Encounters Tab for problem based charting.  Patient discussed with Dr. Daryll Drown

## 2015-12-07 NOTE — Progress Notes (Signed)
I saw and evaluated the patient.  I personally confirmed the key portions of Dr. Shela Commons history and exam and reviewed pertinent patient test results.  The assessment, diagnosis, and plan were formulated together and I agree with the documentation in the resident's note.  I agree with admission to work up these acute events.  I suspect with the volume resuscitation he received in clinic he will meet transfusion criteria again (Hgb < 7.0).  I am unsure what the cause of his anemia is.  Iron deficiency suggests blood loss but it has not been clinically obvious, upper and lower endoscopies have been unrevealing, and I would doubt he had much blood loss associated with his VATS RUL lobectomy (EBL was recorded as 50 ml).  He may have been iron deficient before the surgery and this was exacerbated in the hospital with the surgery and phlebotomy, although the cause of the iron deficiency still remains unclear.  This than raises the possibility of hemolysis or inadequate RBC production.  The WBC count is slightly elevated today as is the plt count, the latter of which is expected in iron deficiency.  Finally, these episodes of hypotension and flushing are unusual and other etiologies would need to be considered if the cause of the anemia can not be determined.

## 2015-12-07 NOTE — H&P (Signed)
Date: 12/07/2015               Patient Name:  Brett [name] MRN: [id number]  DOB: [birth year] Age / Sex: 55 y.o., male   PCP: [name], MD         Medical Service: Internal Medicine Teaching Service         Attending Physician: Dr. Algis Greenhouse    First Contact: Dr. Asencion Partridge Pager: [phone]  Second Contact: Dr. Burgess Estelle Pager: [phone]       After Hours (After 5p/  First Contact Pager: [phone]  weekends / holidays): Second Contact Pager: [phone]   Chief Complaint: Shortness of breath  History of Present Illness: Brett [name] is a 55 y.o. gentleman with PMH T2DM, tobacco abuse, MDD, TBI, and lung adenocarcinoma (Stage IA T1bN0M0 s/p RULx in 09/2015) who presents from clinic with anemia, shortness of breath, and hypotension.  Brett [name] was initially seen in clinic on 11/3 when he complained of shortness of breath. CXR and CTA chest were performed at that time which were negative for pneumonia, pneumothorax, PE, or any other acute process. He was subsequently seen in clinic on 11/7 for worsening shortness of breath and a presyncopal episode, found to have a Hgb of 6.1 and then admitted to the hospital for symptomatic anemia, and transfused 2 units PRBCs. He underwent EGD and colonoscopy which were negative for any GI bleeding. Work up revealed iron deficiency anemia and he was started on oral iron therapy - which he has been taking since. His Hgb improved to 8.0 by time of discharge (11/9), SOB resolved. He remained asymptomatic for 2-3 days but then began to notice progressive shortness of breath on exertion at home. Walking across two rooms in his house leaves him winded and he needs to sit for 5 minutes to catch his breath. He reports extreme fatigue and dyspnea even standing to take a shower this morning. He also reports having a cold 4 days ago with congestion, myalgias, dry cough that has since improved except for residual dry cough. Denies fevers, chills, chest pain,  palpitations, anxiety, dysuria, hematuria, melena or hematochezia. He reports dizziness with standing.    In clinic he was tachypneic in mild distress, SpO2 100% on RA. Blood pressure 80s/60s with mild tachycardic. CBC showed stable Hb 7.9. CXR with no acute abnormalities. He received 2x 1L IVF boluses and reported symptom improvement, BP 90s/60s and ambulatory. IMTS contacted for admission for SOB, hypotension, and anemia.  Meds:  Current Facility-Administered Medications for the 12/07/15 encounter [facility] Encounter)  Medication  . sodium chloride 0.9 % bolus 1,000 mL   No outpatient prescriptions have been marked as taking for the 12/07/15 encounter [facility] Encounter).    Allergies: Allergies as of 12/07/2015 - Review Complete 12/07/2015  Allergen Reaction Noted  . Celebrex [celecoxib]  07/01/2013  . Gabapentin Other (See Comments) 05/26/2013   Past Medical History:  Diagnosis Date  . Anemia 11/28/2015  . Anxiety   . Arthritis    "hands, back" (11/28/2015)  . Barrett's esophagus 07/01/2013   Without dysplasia on biopsy 09/03/2012. Repeat EGD recommended 08/2015  . Carotid artery stenosis 07/01/2013   Requiring right sided stent   . Chronic pain syndrome 07/01/2013  . Closed head injury with brief loss of consciousness ([place]) 07/22/2010   Head trapped in a hydraulic device at work.  Fracture of orbital bones on right and brief loss of consciousness per report.  . Cognitive disorder 04/15/2011  Neuropsychological evaluation (03/2010):  Identified a number of problem areas including cognitive and psychiatric symptoms following a TBI in July 2012. There was likely a strong psycho-social overlay in regard to the cognitive deficits in the form of mood disorder with psychotic features and mixed anxiety symptomatology. His primary tested cognitive deficits are in the areas of attention, executi  . Daily headache "since 07/2010"   constantly  . Degenerative joint disease of cervical spine  07/01/2013  . Dupuytren's contracture of both hands 04/08/2014  . Encounter for antineoplastic chemotherapy 10/05/2015  . Erectile dysfunction associated with type 2 diabetes mellitus ([place]) 07/01/2013  . Fibromyalgia 07/01/2013  . History of blood transfusion 11/28/2015   "suppose to get his first today" (11/28/2015)  . Hyperlipidemia LDL goal < 100 07/01/2013  . Memory changes    "memory issues" from head injury  . Osteoarthritis of right thumb 10/21/2014  . Peripheral vascular occlusive disease ([place]) 07/01/2013   Requiring 2 arterial stents above the left knee per report  . Pneumonia ~ 2006/2007  . Post traumatic stress disorder 07/01/2013  . Primary lung adenocarcinoma ([place]) dx'd 08/2015   "right lung"  . Severe major depression with psychotic features ([place]) 04/15/2011  . Tobacco abuse 07/01/2013  . Tobacco abuse   . Type 2 diabetes mellitus with vascular disease ([place]) 07/01/2013   Left lower extremity and right carotid stenting    Family History:  Family History  Problem Relation Age of Onset  . Heart disease Mother   . Diabetes Mother   . Diabetes Father   . Heart attack Father   . Diabetes Sister   . Coronary artery disease Sister     s/p CABG  . Diabetes Brother   . Healthy Daughter   . Healthy Son   . Diabetes Sister   . Healthy Sister   . Healthy Sister   . Diabetes Brother   . Coronary artery disease Brother     s/p CABG  . Diabetes Brother   . Healthy Brother   . Healthy Brother   . Healthy Daughter    Social History:  Social History   Social History  . Marital status: Married    Spouse name: N/A  . Number of children: N/A  . Years of education: N/A   Occupational History  . Not on file.   Social History Main Topics  . Smoking status: Former Smoker    Packs/day: 1.00    Years: 35.00    Types: Cigarettes    Quit date: 10/18/2015  . Smokeless tobacco: Never Used     Comment: Quit at time of RUL lobectomy for adenocarcinoma  . Alcohol use 0.0 oz/week      Comment: 11/28/2015 "might drink 1, 6 pack of beer/year"  . Drug use: No  . Sexual activity: Not Currently    Birth control/ protection: None     Comment: Wife had tubes tied   Other Topics Concern  . 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  Currently unemployed.  Finished 9th grade.  Lives with wife, 2 daughters, and mother-in-law in [place].    Review of Systems: A complete ROS was negative except as per HPI.   Physical Exam: Blood pressure 99/67, pulse 87, temperature 98.1 F (36.7 C), temperature source Oral, resp. rate 19, weight 60.8 kg (134 lb 0.6 oz), SpO2 100 %.  General appearance: Thin, tired-appearing man, sitting in exam room chair, in no distress HENT: Normocephalic, atraumatic, moist mucous membranes, neck supple Eyes: PERRL, EOM inact, non-icteric Cardiovascular: Regular rate and rhythm, no murmurs, rubs, gallops Respiratory: Clear to auscultation bilaterally, normal work of breathing Abdomen: BS+, soft, non-tender, non-distended Extremities: Normal bulk and range of motion, no edema, 2+ peripheral pulses Skin: Warm, dry, intact Neuro: Alert and oriented Psych: Appropriate affect, halting speech, thoughts linear and goal-directed  CBC Latest Ref Rng & Units 12/07/2015 11/30/2015 11/29/2015  WBC 4.0 - 10.5 K/uL 11.4(H) 6.1 -  Hemoglobin 13.0 - 17.0 g/dL 7.9(L) 8.0(L) 8.4(L)  Hematocrit 39.0 - 52.0 % 26.8(L) 26.1(L) 26.9(L)  Platelets 150 - 400 K/uL 574(H) 434(H) -  MCV 79.8  Dg Chest 2 View  Result Date: 12/07/2015 CLINICAL DATA:  New cough and shortness of breath. Concern for sepsis. Lobectomy for lung cancer in 09/2015. EXAM: CHEST  2 VIEW COMPARISON:  11/24/2015 chest radiographs and CTA FINDINGS: The cardiomediastinal silhouette is unchanged. The heart is normal in size. Sequelae of right upper lobectomy are again  identified. No airspace consolidation, edema, pleural effusion, or pneumothorax is identified. No acute osseous abnormality is seen. IMPRESSION: No active cardiopulmonary disease. Electronically Signed   By: Logan Bores M.D.   On: 12/07/2015 12:47   Assessment & Plan by Problem:  Anemia, borderline microcytic MCV 79, iron-deficiency as Ferritin low at 15 this month. HgB baseline 12 in September. EGD and colonoscopy neg but could have small-bowel AVM or may be production defect.  - Consider GI consult for capsule endoscopy - Consider IV iron - HIV - ANA - Reticulocytes - Peripheral bood smear  Dyspnea, unclear etiology, SpO2 100% on room air, CXR clear, may be related to anemia or cardiac etiology - Trend troponins - EKG - TTE - Measure ambulatory SpO2  Hypotension, improved s/p 2L IVF - D5 1/2 NS mIVF - AM Cortisol Hypotension: improved after 2 L fluids. On D5  NS. Working up for adrenal insufficiency.   T2DM, takes Glipizide 20 BID, Metformin 500 BID, Januvia 100 QD - Follow AM CBGs - Likely add SSI tomorrow  Stage IA primary lung adeno, T1bN0M0 s/p lobectomy on 10/18/2015 - f/up with Dr. Earlie Server  Hx Depression - home Zoloft HLD - home Lipitor  Hx peripheral vascular occlusive disease, s/p carotid stent - continue home Asa, Plavix TBI w/ daily headaches (?) - prn Tylenol  FEN/GI: HHCM diet, replete electrolytes as needed  DVT ppx: Lovenox  Code status: Full Code  Dispo: Admit patient to Observation with expected length of stay less than 2 midnights.  Signed: Asencion Partridge, MD 12/07/2015, 2:20 PM  Pager: [phone]

## 2015-12-07 NOTE — Progress Notes (Incomplete)
I saw and evaluated the patient.  I personally confirmed the key portions of Dr. Shela Commons history and exam and reviewed pertinent patient test results.  The assessment, diagnosis, and plan were formulated together and I agree with the documentation in the resident's note.  I agree with admission to work up these acute events.  I suspect with the volume resuscitation he received in clinic he will meet transfusion criteria again.  I am unsure what the cause of his anemia is.  Iron deficiency suggests blood loss but it has not been clinically obvious, upper and lower endoscopies have been unrevealing, and I would doubt he had much blood loss associated with his VATS RUL lobectomy.  This than raises the possibility of hemolysis or inadequate RBC production.  The WBC count is slightly elevated today as is the plt count, the latter of which is expected in iron deficiency.  Finally, these episodes of hypotension and flushing are unusual and ot

## 2015-12-07 NOTE — Addendum Note (Signed)
Addended by: Burgess Estelle A on: 12/07/2015 03:22 PM   Modules accepted: Orders, SmartSet

## 2015-12-07 NOTE — Assessment & Plan Note (Addendum)
Patient recently admitted for symptomatic anemia and received 2 units PRBCs on 11/7 now returning for follow up with worsening shortness of breath again. He is tachypnic on exam and appears in mild distress, but satting 100% on room air. STAT CBC showed a stable Hgb of 7.9 (was 8.0 on hospital discharge), but his normal range is 11-12. He is hypotensive in the 07P systolic, has an elevated WBC count of 11.4, and mildly tachycardic. I am concerned for the possibility of sepsis vs a delayed transfusion reaction. Can also consider carcinoid syndrome or even adrenal insufficiency for his hypotension. STAT CXR was obtained and was negative for pneumonia or any acute process. For his continued anemia, since EGD and colonoscopy were unrevealing could consider evaluation for a bone marrow etiology. He could have bleeding in the small intestine (AVMs) but has not had any melena or hematochezia.  I have called the inpatient team for admission for further work up of his SOB, hypotension, and anemia.

## 2015-12-07 NOTE — Progress Notes (Signed)
Patient arrived on unit via wheel chair. No family at bedside.  Telemetry placed per MD order and CMT notified.

## 2015-12-07 NOTE — Progress Notes (Signed)
Report called to Nurse on [address].  patient was transported via Wheelchair to [facility].  Saline lock in right arm. Clean dry and intact. Patient remains alert and oriented. Sander Nephew, RN 12/07/2015 3:47 PM

## 2015-12-07 NOTE — Telephone Encounter (Signed)
Received a call from a nurse at Dr. Shela Commons office stating that the pt will not be making his appt w/Mohamed today due to shortness of breathe. States the pt will be admitted to the hospital. Pt's appt was cancelled.

## 2015-12-08 ENCOUNTER — Inpatient Hospital Stay (HOSPITAL_COMMUNITY): Payer: Medicare Other

## 2015-12-08 DIAGNOSIS — I951 Orthostatic hypotension: Secondary | ICD-10-CM | POA: Diagnosis not present

## 2015-12-08 DIAGNOSIS — E785 Hyperlipidemia, unspecified: Secondary | ICD-10-CM

## 2015-12-08 DIAGNOSIS — Z79899 Other long term (current) drug therapy: Secondary | ICD-10-CM | POA: Diagnosis not present

## 2015-12-08 DIAGNOSIS — I959 Hypotension, unspecified: Secondary | ICD-10-CM | POA: Diagnosis not present

## 2015-12-08 DIAGNOSIS — Z95828 Presence of other vascular implants and grafts: Secondary | ICD-10-CM

## 2015-12-08 DIAGNOSIS — R0600 Dyspnea, unspecified: Secondary | ICD-10-CM | POA: Diagnosis not present

## 2015-12-08 DIAGNOSIS — Z7984 Long term (current) use of oral hypoglycemic drugs: Secondary | ICD-10-CM | POA: Diagnosis not present

## 2015-12-08 DIAGNOSIS — Z87820 Personal history of traumatic brain injury: Secondary | ICD-10-CM

## 2015-12-08 DIAGNOSIS — Z7982 Long term (current) use of aspirin: Secondary | ICD-10-CM | POA: Diagnosis not present

## 2015-12-08 DIAGNOSIS — E1151 Type 2 diabetes mellitus with diabetic peripheral angiopathy without gangrene: Secondary | ICD-10-CM

## 2015-12-08 DIAGNOSIS — Z7902 Long term (current) use of antithrombotics/antiplatelets: Secondary | ICD-10-CM | POA: Diagnosis not present

## 2015-12-08 DIAGNOSIS — J069 Acute upper respiratory infection, unspecified: Secondary | ICD-10-CM | POA: Diagnosis not present

## 2015-12-08 DIAGNOSIS — Z902 Acquired absence of lung [part of]: Secondary | ICD-10-CM

## 2015-12-08 DIAGNOSIS — Z8249 Family history of ischemic heart disease and other diseases of the circulatory system: Secondary | ICD-10-CM

## 2015-12-08 DIAGNOSIS — Z85118 Personal history of other malignant neoplasm of bronchus and lung: Secondary | ICD-10-CM | POA: Diagnosis not present

## 2015-12-08 DIAGNOSIS — D509 Iron deficiency anemia, unspecified: Secondary | ICD-10-CM

## 2015-12-08 DIAGNOSIS — C3411 Malignant neoplasm of upper lobe, right bronchus or lung: Secondary | ICD-10-CM | POA: Diagnosis not present

## 2015-12-08 DIAGNOSIS — Z888 Allergy status to other drugs, medicaments and biological substances status: Secondary | ICD-10-CM

## 2015-12-08 DIAGNOSIS — R0609 Other forms of dyspnea: Secondary | ICD-10-CM | POA: Diagnosis not present

## 2015-12-08 DIAGNOSIS — M797 Fibromyalgia: Secondary | ICD-10-CM | POA: Diagnosis not present

## 2015-12-08 DIAGNOSIS — F17211 Nicotine dependence, cigarettes, in remission: Secondary | ICD-10-CM

## 2015-12-08 DIAGNOSIS — D500 Iron deficiency anemia secondary to blood loss (chronic): Secondary | ICD-10-CM | POA: Diagnosis not present

## 2015-12-08 DIAGNOSIS — Z87891 Personal history of nicotine dependence: Secondary | ICD-10-CM | POA: Diagnosis not present

## 2015-12-08 DIAGNOSIS — Z833 Family history of diabetes mellitus: Secondary | ICD-10-CM

## 2015-12-08 DIAGNOSIS — R0602 Shortness of breath: Secondary | ICD-10-CM | POA: Diagnosis not present

## 2015-12-08 DIAGNOSIS — Z95820 Peripheral vascular angioplasty status with implants and grafts: Secondary | ICD-10-CM | POA: Diagnosis not present

## 2015-12-08 DIAGNOSIS — R51 Headache: Secondary | ICD-10-CM

## 2015-12-08 LAB — URINALYSIS, ROUTINE W REFLEX MICROSCOPIC
Bilirubin Urine: NEGATIVE
Glucose, UA: 500 mg/dL — AB
Hgb urine dipstick: NEGATIVE
Ketones, ur: NEGATIVE mg/dL
Leukocytes, UA: NEGATIVE
Nitrite: NEGATIVE
Protein, ur: NEGATIVE mg/dL
Specific Gravity, Urine: 1.006 (ref 1.005–1.030)
pH: 6 (ref 5.0–8.0)

## 2015-12-08 LAB — COMPREHENSIVE METABOLIC PANEL
ALT: 9 U/L — ABNORMAL LOW (ref 17–63)
AST: 12 U/L — ABNORMAL LOW (ref 15–41)
Albumin: 2.7 g/dL — ABNORMAL LOW (ref 3.5–5.0)
Alkaline Phosphatase: 62 U/L (ref 38–126)
Anion gap: 6 (ref 5–15)
BUN: 10 mg/dL (ref 6–20)
CO2: 27 mmol/L (ref 22–32)
Calcium: 8.7 mg/dL — ABNORMAL LOW (ref 8.9–10.3)
Chloride: 104 mmol/L (ref 101–111)
Creatinine, Ser: 0.72 mg/dL (ref 0.61–1.24)
GFR calc Af Amer: >60 mL/min (ref >60)
GFR calc non Af Amer: >60 mL/min (ref >60)
Glucose, Bld: 262 mg/dL — ABNORMAL HIGH (ref 65–99)
Potassium: 4.4 mmol/L (ref 3.5–5.1)
Sodium: 137 mmol/L (ref 135–145)
Total Bilirubin: 0.4 mg/dL (ref 0.3–1.2)
Total Protein: 5.6 g/dL — ABNORMAL LOW (ref 6.5–8.1)

## 2015-12-08 LAB — SAVE SMEAR

## 2015-12-08 LAB — HEMOGLOBIN AND HEMATOCRIT, BLOOD
HCT: 28 % — ABNORMAL LOW (ref 39.0–52.0)
Hemoglobin: 8.4 g/dL — ABNORMAL LOW (ref 13.0–17.0)

## 2015-12-08 LAB — DIRECT ANTIGLOBULIN TEST (NOT AT ARMC)
DAT, IgG: NEGATIVE
DAT, complement: NEGATIVE

## 2015-12-08 LAB — ECHOCARDIOGRAM COMPLETE
Height: 67 in
Weight: 2144.63 oz

## 2015-12-08 LAB — HIV ANTIBODY (ROUTINE TESTING W REFLEX): HIV Screen 4th Generation wRfx: NONREACTIVE

## 2015-12-08 LAB — CBC
HCT: 21.7 % — ABNORMAL LOW (ref 39.0–52.0)
Hemoglobin: 6.6 g/dL — CL (ref 13.0–17.0)
MCH: 24.2 pg — ABNORMAL LOW (ref 26.0–34.0)
MCHC: 30.4 g/dL (ref 30.0–36.0)
MCV: 79.5 fL (ref 78.0–100.0)
Platelets: 427 10*3/uL — ABNORMAL HIGH (ref 150–400)
RBC: 2.73 MIL/uL — ABNORMAL LOW (ref 4.22–5.81)
RDW: 16.7 % — ABNORMAL HIGH (ref 11.5–15.5)
WBC: 6 10*3/uL (ref 4.0–10.5)

## 2015-12-08 LAB — LACTATE DEHYDROGENASE: LDH: 106 U/L (ref 98–192)

## 2015-12-08 LAB — CORTISOL: Cortisol, Plasma: 10.9 ug/dL

## 2015-12-08 LAB — RETICULOCYTES
RBC.: 2.73 MIL/uL — ABNORMAL LOW (ref 4.22–5.81)
Retic Count, Absolute: 117.4 10*3/uL (ref 19.0–186.0)
Retic Ct Pct: 4.3 % — ABNORMAL HIGH (ref 0.4–3.1)

## 2015-12-08 LAB — OCCULT BLOOD X 1 CARD TO LAB, STOOL: Fecal Occult Bld: POSITIVE — AB

## 2015-12-08 LAB — TROPONIN I: Troponin I: <0.03 ng/mL (ref <0.03)

## 2015-12-08 LAB — PREPARE RBC (CROSSMATCH)

## 2015-12-08 LAB — GLUCOSE, CAPILLARY: Glucose-Capillary: 252 mg/dL — ABNORMAL HIGH (ref 65–99)

## 2015-12-08 MED ORDER — LISINOPRIL 5 MG PO TABS: 2.5000 mg | ORAL_TABLET | Freq: Every day | ORAL | 3 refills | Status: DC

## 2015-12-08 MED ORDER — SODIUM CHLORIDE 0.9 % IV SOLN
510.0000 mg | Freq: Once | INTRAVENOUS | Status: AC
  Administered 2015-12-08: 510 mg via INTRAVENOUS
  Filled 2015-12-08: qty 17

## 2015-12-08 MED ORDER — SODIUM CHLORIDE 0.9 % IV SOLN: Freq: Once | INTRAVENOUS | Status: DC

## 2015-12-08 MED ORDER — INSULIN ASPART 100 UNIT/ML ~~LOC~~ SOLN
0.0000 [IU] | SUBCUTANEOUS | Status: DC
  Administered 2015-12-08: 5 [IU] via SUBCUTANEOUS
  Administered 2015-12-08: 8 [IU] via SUBCUTANEOUS

## 2015-12-08 NOTE — Progress Notes (Signed)
SATURATION QUALIFICATIONS: (This note is used to comply with regulatory documentation for home oxygen)  Patient Saturations on Room Air at Rest = 100%  Patient Saturations on Room Air while Ambulating = 100%  Patient Saturations on 0 Liters of oxygen while Ambulating = 100%  Please briefly explain why patient needs home oxygen: N/A

## 2015-12-08 NOTE — Progress Notes (Signed)
Inpatient Diabetes Program Recommendations  AACE/ADA: New Consensus Statement on Inpatient Glycemic Control (2015)  Target Ranges:  Prepandial:   less than 140 mg/dL      Peak postprandial:   less than 180 mg/dL (1-2 hours)      Critically ill patients:  140 - 180 mg/dL  Results for Brett, [name] (MRN [id number]) as of 12/08/2015 12:52  Ref. Range 12/07/2015 10:35 12/08/2015 04:07  Glucose Latest Ref Range: 65 - 99 mg/dL 275 (H) 262 (H)   Results for Brett, [name] (MRN [id number]) as of 12/08/2015 12:52  Ref. Range 12/08/2015 08:00  Glucose-Capillary Latest Ref Range: 65 - 99 mg/dL 252 (H)   Review of Glycemic Control  Diabetes history: DM2 Outpatient Diabetes medications: Glipizide 20 mg BID, Metformin 500 mg BID, Januvia 100 mg daily Current orders for Inpatient glycemic control: Novolog 0-15 units Q4H  Inpatient Diabetes Program Recommendations: Insulin - Basal: If glucose remains consistently elevated greater than 180 mg/dl with Novolog correction insulin, please consider ordering low dose Lantus. Would recommend starting on Lantus 6 units Q24H (based on 60 kg x 0.1 units).  Thanks, Barnie Alderman, RN, MSN, CDE Diabetes Coordinator Inpatient Diabetes Program [phone] (Team Pager from 8am to 5pm)

## 2015-12-08 NOTE — Care Management Note (Signed)
Case Management Note  Patient Details  Name: KEYSEAN SAVINO MRN: [id number] Date of Birth: 18-Oct-1960  Subjective/Objective:   Iron deficiency anemia                 Action/Plan: Discharge Planning: AVS reviewed: NCM spoke to pt and lives at home with wife. No NCM needs identified.   PCP Dr [name]  Expected Discharge Date:  11/172017           Expected Discharge Plan:  Home/Self Care  In-House Referral:  NA  Discharge planning Services  CM Consult  Post Acute Care Choice:  NA Choice offered to:  NA  DME Arranged:  N/A DME Agency:  NA  HH Arranged:  NA HH Agency:  NA  Status of Service:  Completed, signed off  If discussed at [place] of Stay Meetings, dates discussed:    Additional Comments:  Erenest Rasher, RN 12/08/2015, 4:24 PM

## 2015-12-08 NOTE — Discharge Summary (Signed)
Name: Brett [name] MRN: [id number] DOB: [birth year] 55 y.o. PCP: [name], MD  Date of Admission: 12/07/2015  3:58 PM Date of Discharge: 12/08/2015 Attending Physician: Annia Belt, MD  Discharge Diagnosis: 1. Iron deficiency anemia 2. Orthostatic hypotension 3. Viral URI  Active Problems:   Hypotension   Iron deficiency anemia due to chronic blood loss   Discharge Medications:   Medication List    STOP taking these medications   HYDROcodone-acetaminophen 10-325 MG tablet Commonly known as:  NORCO     TAKE these medications   aspirin EC 81 MG tablet Take 81 mg by mouth daily.   atorvastatin 40 MG tablet Commonly known as:  LIPITOR Take 1 tablet (40 mg total) by mouth daily.   clopidogrel 75 MG tablet Commonly known as:  PLAVIX Take 75 mg by mouth daily with breakfast.   ferrous sulfate 325 (65 FE) MG tablet Take 1 tablet (325 mg total) by mouth 2 (two) times daily with a meal.   glipiZIDE 10 MG tablet Commonly known as:  GLUCOTROL Take 2 tablets (20 mg total) by mouth 2 (two) times daily before a meal.   lisinopril 5 MG tablet Commonly known as:  PRINIVIL,ZESTRIL Take 0.5 tablets (2.5 mg total) by mouth daily. What changed:  how much to take   metFORMIN 500 MG tablet Commonly known as:  GLUCOPHAGE Take 1 tablet (500 mg total) by mouth 2 (two) times daily with a meal.   pantoprazole 40 MG tablet Commonly known as:  PROTONIX Take 1 tablet (40 mg total) by mouth daily.   prochlorperazine 10 MG tablet Commonly known as:  COMPAZINE Take 1 tablet (10 mg total) by mouth every 6 (six) hours as needed for nausea or vomiting.   sertraline 25 MG tablet Commonly known as:  ZOLOFT Take 1 tablet (25 mg total) by mouth daily.   sitaGLIPtin 100 MG tablet Commonly known as:  JANUVIA Take 1 tablet (100 mg total) by mouth daily.       Disposition and follow-up:   Brett [name] was discharged from [facility] in Stable  condition.  At the hospital follow up visit please address:  1.  Anemia - acute presentation in early 11/2015, recovering s/p blood transfusions, oral/IV iron and Hb stable/recovering. Occult GI bleeding source as FOBT+ and EGD/colonoscopy negative at previous admission. Assess for symptoms of anemia and melena/hematochezia  Orthostatic hypotension - assess for hypotension, po intake, and dizziness/syncope  Viral URI - recent illness with myalgias, congestion, cough - assess for persistent symptoms  T2DM, HbA1c 6.4 on 11/24/15, - assess glycemic control   2.  Labs / imaging needed at time of follow-up: CBC, CBG  3.  Pending labs/ test needing follow-up: None  Follow-up Appointments: Follow-up Information    [facility] on 12/21/2015.   Why:  At 10:45 for hosptial follow up appointment, please arrive 15 minutes early to check in. Contact information: [address] [place] [facility] Course by problem list: Active Problems:   Hypotension   Iron deficiency anemia due to chronic blood loss   1. Iron deficiency anemia Brett [name] is a 55 y.o. gentleman with PMH T2DM, tobacco abuse, MDD, TBI, and lung adenocarcinoma (Stage IA T1bN0M0 s/p RULx in 09/2015) who presented from clinic on 11/16 with anemia, shortness of breath, and hypotension. Hemoglobin on presentation was 7.9, compared to 8.0 on discharge from  previous hospitalization. He had been compliant with oral iron supplementation. Labs revealed no signs of hemolysis or delayed hemolytic transfusion reaction. Peripheral blood smear was reviewed and showed mixed population of hypochromic microcytic RBCs with spherocytes, no schistocytes, and increased platelet count.  Reticulocytes were appropriately elevated at 4.3%, DAT negative, LDH normal. Following IVF resuscitation his Hb dropped to 6.6 and he was transfused with 1 unit pRBCs to bring his Hb to 8.4 prior  to discharge. He also received a dose of IV iron. GI workup at previous hospital stay showed no obvious source of bleeding on EGD or colonoscopy but FOBT was positive this admission. Patient will require outpatient GI follow up to undergo capsule endoscopy to find occult source of GI bleeding.   2. Orthostatic hypotension - presented to clinic with blood pressures in 80s/60s with orthostatic symptoms, received 1L IV fluid boluses x2 with improvement in subjective symptoms. Unclear etiology, possibly poor po intake in setting of recent URI vs adrenal insufficiency. Received IVF overnight following admission and blood pressures improved to 100-110s/60-70s on 11/17. Patient was ambulatory in room and on hospital unit with no dizziness. Cardiac workup was completely negative with negative troponins, unchanged EKG, and unremarkable TTE on 11/17 showing LVEF 55-60%. AM cortisol was 10.9 (Ref 6.7-22.6) which may be considered low-normal in hospital setting. Considered ACTH stim test but did not feel was necessary to prolong hospital stay for this given his improvement in symptoms with IVF.   3. Viral URI - Ms. Kuehl reported persistent dry cough following 4 days of congestion, myalgia, and malaise prior to admission. He remained afebrile during his hospital stay and cough remained non-productive. CXR on 11/16 revealed no acute findings. This recent illness may have been responsible for his fatigue and shortness of breath on presentation, as Brett [name] has decreased pulmonary reserve at baseline (recent RUL lobectomy and mild COPD from 35 pack-year smoking history). Ambulatory SpO2 remained 100% on room air on 11/17 and the patient was deemed stable for discharge home with PCP follow up.  Discharge Vitals:   BP 106/60   Pulse 72   Temp 98.1 F (36.7 C) (Oral)   Resp 18   Wt 60.8 kg (134 lb 0.6 oz)   SpO2 100%   BMI 20.99 kg/m   Pertinent Labs, Studies, and Procedures:  CBC Latest Ref Rng & Units 12/08/2015  12/08/2015 12/07/2015  WBC 4.0 - 10.5 K/uL - 6.0 11.4(H)  Hemoglobin 13.0 - 17.0 g/dL 8.4(L) 6.6(LL) 7.9(L)  Hematocrit 39.0 - 52.0 % 28.0(L) 21.7(L) 26.8(L)  Platelets 150 - 400 K/uL - 427(H) 574(H)    Ref Range & Units 2d ago  Fecal Occult Bld NEGATIVE POSITIVE      2d ago  DAT, complement NEG   DAT, IgG NEG     Ref Range & Units 2d ago  Retic Ct Pct 0.4 - 3.1 % 4.3    RBC. 4.22 - 5.81 MIL/uL 2.73    Retic Count, Manual 19.0 - 186.0 K/uL 117.4     Ref Range & Units 2d ago  Cortisol, Plasma ug/dL 10.9   Comments: (NOTE)  AM  6.7 - 22.6 ug/dL  PM  <10.0    ug/dL    Dg Chest 2 View  Result Date: 12/07/2015 CLINICAL DATA:  New cough and shortness of breath. Concern for sepsis. Lobectomy for lung cancer in 09/2015. EXAM: CHEST  2 VIEW COMPARISON:  11/24/2015 chest radiographs and CTA FINDINGS: The cardiomediastinal silhouette is unchanged. The heart is normal in size.  Sequelae of right upper lobectomy are again identified. No airspace consolidation, edema, pleural effusion, or pneumothorax is identified. No acute osseous abnormality is seen. IMPRESSION: No active cardiopulmonary disease. Electronically Signed   By: Logan Bores M.D.   On: 12/07/2015 12:47   Transthoracic Echocardiography - 12/08/2015  Left ventricle:  The cavity size was normal. Wall thickness was normal. Systolic function was normal. The estimated ejection fraction was in the range of 55% to 60%. Wall motion was normal; there were no regional wall motion abnormalities. Left ventricular diastolic function parameters were normal for the patient&'s age.   ------------------------------------------------------------------- Aortic valve:   Mildly thickened leaflets.  Doppler:   There was no stenosis.   There was no significant regurgitation.  ------------------------------------------------------------------- Aorta:  Aortic root: The aortic root was normal in size. Ascending aorta: The ascending aorta was  normal in size.  ------------------------------------------------------------------- Mitral valve:   Structurally normal valve.   Leaflet separation was normal.  Doppler:  Transvalvular velocity was within the normal range. There was no evidence for stenosis. There was trivial regurgitation.    Peak gradient (D): 5 mm Hg.  ------------------------------------------------------------------- Left atrium:  The atrium was normal in size.  ------------------------------------------------------------------- Right ventricle:  The cavity size was normal. Systolic function was normal.  ------------------------------------------------------------------- Pulmonic valve:    The valve appears to be grossly normal. Doppler:  There was no significant regurgitation.  ------------------------------------------------------------------- Tricuspid valve:   The valve appears to be grossly normal. Doppler:  There was trivial regurgitation.  ------------------------------------------------------------------- Right atrium:  The atrium was normal in size.  ------------------------------------------------------------------- Pericardium:  There was no pericardial effusion.  Discharge Instructions: Discharge Instructions    Call MD for:  difficulty breathing, headache or visual disturbances    Complete by:  As directed    Call MD for:  extreme fatigue    Complete by:  As directed    Call MD for:  persistant dizziness or light-headedness    Complete by:  As directed    Call MD for:  persistant nausea and vomiting    Complete by:  As directed    Call MD for:  redness, tenderness, or signs of infection (pain, swelling, redness, odor or green/yellow discharge around incision site)    Complete by:  As directed    Call MD for:  temperature >100.4    Complete by:  As directed    Diet - low sodium heart healthy    Complete by:  As directed    Discharge instructions    Complete by:  As directed     Please continue to take your medications as prescribed and remain well hydrated. We have adjusted one of your medications (Lisinopril), please take a 1/2 tablet from now on. As this medication can make your blood pressure too low.   If you develop worsening symptoms of dizziness, shortness of breath, chest pain, fevers, or other concerning symptoms please call our clinic to schedule an acute visit, or visit the nearest ER.   Increase activity slowly    Complete by:  As directed       Signed: Asencion Partridge, MD 12/10/2015, 10:40 AM   Pager: [phone]

## 2015-12-08 NOTE — Progress Notes (Signed)
Initial Nutrition Assessment  DOCUMENTATION CODES:   Non-severe (moderate) malnutrition in context of chronic illness  INTERVENTION:  Continue Ensure Enlive po BID, each supplement provides 350 kcal and 20 grams of protein.  Encourage adequate PO intake.   NUTRITION DIAGNOSIS:   Malnutrition related to chronic illness as evidenced by moderate depletions of muscle mass, moderate depletion of body fat.  GOAL:   Patient will meet greater than or equal to 90% of their needs  MONITOR:   PO intake, Supplement acceptance, Labs, Weight trends, Skin, I & O's  REASON FOR ASSESSMENT:   Malnutrition Screening Tool    ASSESSMENT:   55 y.o. gentleman with PMH T2DM, tobacco abuse, MDD, TBI, and lung adenocarcinoma (Stage IA T1bN0M0 s/p RULx in 09/2015) who presents from clinic with anemia, shortness of breath, and hypotension.  Pt reports appetite has improved. No recent meal completion recorded, however pt reports consuming most of his food at meals. Pt reports usually eating well at home with consumption of at least 3 meals a day. Pt does report poor po intake the 2-3 days PTA. Weight has been stable. Pt currently has Ensure ordered and has been consuming them. RD to continue with current orders.   Nutrition-Focused physical exam completed. Findings are moderate fat depletion, moderate muscle depletion, and no edema.   Labs and mediations reviewed.   Diet Order:  Diet heart healthy/carb modified Room service appropriate? Yes; Fluid consistency: Thin Diet - low sodium heart healthy  Skin:  Reviewed, no issues  Last BM:  11/16  Height:   Ht Readings from Last 1 Encounters:  12/07/15 '5\' 7"'$  (1.702 m)    Weight:   Wt Readings from Last 1 Encounters:  12/07/15 134 lb 0.6 oz (60.8 kg)    Ideal Body Weight:  67.27 kg  BMI:  Body mass index is 20.99 kg/m.  Estimated Nutritional Needs:   Kcal:  1800-2000  Protein:  75-90 grams  Fluid:  1.8 -2 L/day  EDUCATION NEEDS:    No education needs identified at this time  Corrin Parker, MS, RD, LDN Pager # [phone] After hours/ weekend pager # [phone]

## 2015-12-08 NOTE — Care Management CC44 (Signed)
Condition Code 44 Documentation Completed  Patient Details  Name: CHAYANNE SPEIR MRN: [id number] Date of Birth: [birth year]   Condition Code 44 given:  Yes Patient signature on Condition Code 44 notice:  Yes Documentation of 2 MD's agreement:  Yes Code 44 added to claim:  Yes    Erenest Rasher, RN 12/08/2015, 4:20 PM

## 2015-12-08 NOTE — Progress Notes (Signed)
Brett [name] to be D/C'd Home per MD order.  Discussed prescriptions and follow up appointments with the patient. Prescriptions given to patient, medication list explained in detail. Pt verbalized understanding.    Medication List    STOP taking these medications   HYDROcodone-acetaminophen 10-325 MG tablet Commonly known as:  NORCO     TAKE these medications   aspirin EC 81 MG tablet Take 81 mg by mouth daily.   atorvastatin 40 MG tablet Commonly known as:  LIPITOR Take 1 tablet (40 mg total) by mouth daily.   clopidogrel 75 MG tablet Commonly known as:  PLAVIX Take 75 mg by mouth daily with breakfast.   ferrous sulfate 325 (65 FE) MG tablet Take 1 tablet (325 mg total) by mouth 2 (two) times daily with a meal.   glipiZIDE 10 MG tablet Commonly known as:  GLUCOTROL Take 2 tablets (20 mg total) by mouth 2 (two) times daily before a meal.   lisinopril 5 MG tablet Commonly known as:  PRINIVIL,ZESTRIL Take 0.5 tablets (2.5 mg total) by mouth daily. What changed:  how much to take   metFORMIN 500 MG tablet Commonly known as:  GLUCOPHAGE Take 1 tablet (500 mg total) by mouth 2 (two) times daily with a meal.   pantoprazole 40 MG tablet Commonly known as:  PROTONIX Take 1 tablet (40 mg total) by mouth daily.   prochlorperazine 10 MG tablet Commonly known as:  COMPAZINE Take 1 tablet (10 mg total) by mouth every 6 (six) hours as needed for nausea or vomiting.   sertraline 25 MG tablet Commonly known as:  ZOLOFT Take 1 tablet (25 mg total) by mouth daily.   sitaGLIPtin 100 MG tablet Commonly known as:  JANUVIA Take 1 tablet (100 mg total) by mouth daily.       Vitals:   12/08/15 1045 12/08/15 1241  BP: 114/71 106/60  Pulse: 74 72  Resp: 18 18  Temp: 98.1 F (36.7 C) 98.1 F (36.7 C)    Skin clean, dry and intact without evidence of skin break down, no evidence of skin tears noted. IV catheter discontinued intact. Site without signs and symptoms of  complications. Dressing and pressure applied. Pt denies pain at this time. No complaints noted.  An After Visit Summary was printed and given to the patient. Patient escorted via [place], and D/C home via private auto.  Retta Mac BSN, RN

## 2015-12-08 NOTE — Progress Notes (Signed)
  Echocardiogram 2D Echocardiogram has been performed.  Brett [name] 12/08/2015, 9:54 AM

## 2015-12-08 NOTE — Care Management Obs Status (Signed)
[place] NOTIFICATION   Patient Details  Name: Brett [name] MRN: [id number] Date of Birth: 07-22-1960   Medicare Observation Status Notification Given:       Erenest Rasher, RN 12/08/2015, 4:19 PM

## 2015-12-08 NOTE — Consult Note (Signed)
   [facility] CM Inpatient Consult   12/08/2015  Brett [name] 1960/01/22 [id number]    Patient screened for [place] Management services. Went to bedside to offer and explain [facility] Care Management program with patient. Patient declined [place] Management follow up. Wife states [name] has good follow up with his doctors. Accepted [facility] Care Management brochure with contact information to call in future if changes mind. Will make inpatient RNCM aware that patient declined [place] Management program services.  Marthenia Rolling, MSN-Ed, RN,BSN [facility] Liaison [phone]

## 2015-12-08 NOTE — Progress Notes (Signed)
Subjective: Asymptomatic today after receiving plenty of fluids overnight, ambulatory and breathing well. Denies any significant dizziness upon standing. No shortness of breath.   Objective: Vital signs in last 24 hours: Vitals:   12/07/15 2121 12/08/15 0510 12/08/15 0934 12/08/15 1045  BP: 99/67 (!) 106/59 114/64 114/71  Pulse: 87 81 76 74  Resp: '19 18 18 18  '$ Temp: 98.1 F (36.7 C) 97.7 F (36.5 C) 97.4 F (36.3 C) 98.1 F (36.7 C)  TempSrc: Oral Oral Oral Oral  SpO2: 100% 100% 100% 100%  Weight: 60.8 kg (134 lb 0.6 oz)       Intake/Output Summary (Last 24 hours) at 12/08/15 1154 Last data filed at 12/08/15 1045  Gross per 24 hour  Intake           934.25 ml  Output             4160 ml  Net         -3225.75 ml    Physical Exam General appearance: Thin, tired-appearing man, sitting in bed HENT: Normocephalic, moist mucous membranes Cardiovascular: Regular rate and rhythm, no murmurs, rubs, gallops Respiratory: Clear to auscultation bilaterally, normal work of breathing Abdomen: BS+, soft, non-tender, non-distended Extremities: Normal bulk and range of motion, no edema, 2+ peripheral pulses Skin: Warm, dry, intact Psych: Dazed affect, halting speech, thoughts linear and goal-directed  Labs / Imaging / Procedures: CBC Latest Ref Rng & Units 12/08/2015 12/07/2015 11/30/2015  WBC 4.0 - 10.5 K/uL 6.0 11.4(H) 6.1  Hemoglobin 13.0 - 17.0 g/dL 6.6(LL) 7.9(L) 8.0(L)  Hematocrit 39.0 - 52.0 % 21.7(L) 26.8(L) 26.1(L)  Platelets 150 - 400 K/uL 427(H) 574(H) 434(H)   BMP Latest Ref Rng & Units 12/08/2015 12/07/2015 11/29/2015  Glucose 65 - 99 mg/dL 262(H) 275(H) 120(H)  BUN 6 - 20 mg/dL '10 14 10  '$ Creatinine 0.61 - 1.24 mg/dL 0.72 0.85 0.67  BUN/Creat Ratio 9 - 20 - - -  Sodium 135 - 145 mmol/L 137 133(L) 139  Potassium 3.5 - 5.1 mmol/L 4.4 4.6 3.8  Chloride 101 - 111 mmol/L 104 101 109  CO2 22 - 32 mmol/L '27 23 22  '$ Calcium 8.9 - 10.3 mg/dL 8.7(L) 9.1 8.3(L)    06:40    DAT, complement NEG   DAT, IgG NEG     Ref Range & Units 08:43  LDH 98 - 192 U/L 106     Ref Range & Units 04:07  Retic Ct Pct 0.4 - 3.1 % 4.3    RBC. 4.22 - 5.81 MIL/uL 2.73    Retic Count, Manual 19.0 - 186.0 K/uL 117.4    HIV negative   Ref Range & Units 04:07  Cortisol, Plasma ug/dL 10.9   Comments: (NOTE)  AM  6.7 - 22.6 ug/dL  PM  <10.0    ug/dL    Transthoracic Echocardiogram - 12/08/15 Study Conclusions  - Left ventricle: The cavity size was normal. Wall thickness was   normal. Systolic function was normal. The estimated ejection   fraction was in the range of 55% to 60%. Wall motion was normal; there were no regional wall motion abnormalities. Left   ventricular diastolic function parameters were normal for the   patient&'s age.  Assessment/Plan: Brett [name] is a 55 y.o. gentleman with PMH T2DM, tobacco abuse, MDD, TBI, and lung adenocarcinoma (Stage IA T1bN0M0 s/p RULx in 09/2015) admitted for anemia, shortness of breath, and hypotension.  Anemia, borderline microcytic MCV 79, iron-deficiency as Ferritin low at 15 this month. HgB  baseline 12 in September. EGD and colonoscopy negative but may have some small bowel process, DAT negative. Hb 6.6 and received additional unit pRBCs today, post transfusion Hb 8.4. - GI outpatient follow up for capsule endoscopy - Feraheme 510 mg x1 today - Peripheral bood smear - some microcytic hypochromic RBC but increased retics and spherocytes present, overall consistent with recovering iron deficiency anemia  Dyspnea, unclear etiology, SpO2 100% on room air, CXR clear, likely secondary to COPD (significant smoking history), recent RUL lobectomy giving him decreased pulm reserve, and recent viral URI this past weakend - Trend troponins - negative - EKG - unremarkable - TTE normal - Ambulatory SpO2 - 100% on RA while walking today - Supportive care  Hypotension, improved s/p 2L IVF bolus and fluids overnight, pressures  consistently in 100-110s/60s today, ambulatory without orthostatic symptoms - D5 1/2 NS mIVF - Encourage hydration and po intake - AM Cortisol - 10, equivocal, consider ACTH stim test in the future before attempting steroids  T2DM, takes Glipizide 20 BID, Metformin 500 BID, Januvia 100 QD - Follow AM CBGs - SSI   Stage IA primary lung adeno, T1bN0M0 s/p lobectomy on 10/18/2015 - f/up with Dr. Earlie Server  Hx Depression - home Zoloft HLD - home Lipitor  Hx peripheral vascular occlusive disease, s/p carotid stent - continue home [place], Plavix TBI w/ daily headaches (?) - prn Tylenol  FEN/GI: HHCM diet, replete electrolytes as needed  DVT ppx: Lovenox  Dispo: Anticipated discharge today.  LOS: 1 day   Asencion Partridge, MD 12/08/2015, 11:54 AM Pager: [phone]

## 2015-12-09 LAB — TYPE AND SCREEN
ABO/RH(D): A POS
Antibody Screen: NEGATIVE
Unit division: 0

## 2015-12-11 LAB — ANTINUCLEAR ANTIBODIES, IFA: ANA Ab, IFA: NEGATIVE

## 2015-12-11 LAB — GLUCOSE, CAPILLARY: Glucose-Capillary: 240 mg/dL — ABNORMAL HIGH (ref 65–99)

## 2015-12-21 ENCOUNTER — Inpatient Hospital Stay (HOSPITAL_COMMUNITY)
Admission: AD | Admit: 2015-12-21 | Discharge: 2015-12-25 | DRG: 812 | Disposition: A | Discharge status: Home / Self Care | Payer: Medicare Other | Source: Ambulatory Visit | Attending: Internal Medicine | Admitting: Internal Medicine

## 2015-12-21 ENCOUNTER — Encounter (HOSPITAL_COMMUNITY): Payer: Self-pay | Admitting: General Practice

## 2015-12-21 ENCOUNTER — Ambulatory Visit (INDEPENDENT_AMBULATORY_CARE_PROVIDER_SITE_OTHER): Payer: Medicare Other | Admitting: Internal Medicine

## 2015-12-21 VITALS — BP 96/63 | HR 100 | Temp 97.7°F | Resp 24 | Ht 67.0 in | Wt 126.6 lb

## 2015-12-21 DIAGNOSIS — Z7902 Long term (current) use of antithrombotics/antiplatelets: Secondary | ICD-10-CM | POA: Diagnosis not present

## 2015-12-21 DIAGNOSIS — Z7982 Long term (current) use of aspirin: Secondary | ICD-10-CM

## 2015-12-21 DIAGNOSIS — D500 Iron deficiency anemia secondary to blood loss (chronic): Principal | ICD-10-CM | POA: Diagnosis present

## 2015-12-21 DIAGNOSIS — Z833 Family history of diabetes mellitus: Secondary | ICD-10-CM

## 2015-12-21 DIAGNOSIS — R1033 Periumbilical pain: Secondary | ICD-10-CM | POA: Diagnosis not present

## 2015-12-21 DIAGNOSIS — G894 Chronic pain syndrome: Secondary | ICD-10-CM | POA: Diagnosis not present

## 2015-12-21 DIAGNOSIS — Z902 Acquired absence of lung [part of]: Secondary | ICD-10-CM | POA: Diagnosis not present

## 2015-12-21 DIAGNOSIS — Z8701 Personal history of pneumonia (recurrent): Secondary | ICD-10-CM

## 2015-12-21 DIAGNOSIS — Z7984 Long term (current) use of oral hypoglycemic drugs: Secondary | ICD-10-CM

## 2015-12-21 DIAGNOSIS — D649 Anemia, unspecified: Secondary | ICD-10-CM

## 2015-12-21 DIAGNOSIS — R112 Nausea with vomiting, unspecified: Secondary | ICD-10-CM | POA: Diagnosis not present

## 2015-12-21 DIAGNOSIS — F329 Major depressive disorder, single episode, unspecified: Secondary | ICD-10-CM | POA: Diagnosis present

## 2015-12-21 DIAGNOSIS — E440 Moderate protein-calorie malnutrition: Secondary | ICD-10-CM | POA: Diagnosis present

## 2015-12-21 DIAGNOSIS — I951 Orthostatic hypotension: Secondary | ICD-10-CM | POA: Diagnosis not present

## 2015-12-21 DIAGNOSIS — M1990 Unspecified osteoarthritis, unspecified site: Secondary | ICD-10-CM | POA: Diagnosis not present

## 2015-12-21 DIAGNOSIS — Z85118 Personal history of other malignant neoplasm of bronchus and lung: Secondary | ICD-10-CM | POA: Diagnosis not present

## 2015-12-21 DIAGNOSIS — K921 Melena: Secondary | ICD-10-CM | POA: Diagnosis not present

## 2015-12-21 DIAGNOSIS — E785 Hyperlipidemia, unspecified: Secondary | ICD-10-CM | POA: Diagnosis present

## 2015-12-21 DIAGNOSIS — D62 Acute posthemorrhagic anemia: Secondary | ICD-10-CM | POA: Diagnosis not present

## 2015-12-21 DIAGNOSIS — Z888 Allergy status to other drugs, medicaments and biological substances status: Secondary | ICD-10-CM

## 2015-12-21 DIAGNOSIS — A084 Viral intestinal infection, unspecified: Secondary | ICD-10-CM | POA: Diagnosis not present

## 2015-12-21 DIAGNOSIS — E1151 Type 2 diabetes mellitus with diabetic peripheral angiopathy without gangrene: Secondary | ICD-10-CM | POA: Diagnosis not present

## 2015-12-21 DIAGNOSIS — Z9889 Other specified postprocedural states: Secondary | ICD-10-CM

## 2015-12-21 DIAGNOSIS — Z79899 Other long term (current) drug therapy: Secondary | ICD-10-CM | POA: Diagnosis not present

## 2015-12-21 DIAGNOSIS — Z681 Body mass index (BMI) 19 or less, adult: Secondary | ICD-10-CM | POA: Diagnosis not present

## 2015-12-21 DIAGNOSIS — D509 Iron deficiency anemia, unspecified: Secondary | ICD-10-CM | POA: Diagnosis not present

## 2015-12-21 DIAGNOSIS — Z87891 Personal history of nicotine dependence: Secondary | ICD-10-CM | POA: Diagnosis not present

## 2015-12-21 DIAGNOSIS — Z8249 Family history of ischemic heart disease and other diseases of the circulatory system: Secondary | ICD-10-CM

## 2015-12-21 DIAGNOSIS — Z87820 Personal history of traumatic brain injury: Secondary | ICD-10-CM

## 2015-12-21 DIAGNOSIS — C3411 Malignant neoplasm of upper lobe, right bronchus or lung: Secondary | ICD-10-CM | POA: Diagnosis not present

## 2015-12-21 DIAGNOSIS — R195 Other fecal abnormalities: Secondary | ICD-10-CM | POA: Diagnosis not present

## 2015-12-21 DIAGNOSIS — F410 Panic disorder [episodic paroxysmal anxiety] without agoraphobia: Secondary | ICD-10-CM

## 2015-12-21 LAB — COMPREHENSIVE METABOLIC PANEL
ALT: 11 U/L — ABNORMAL LOW (ref 17–63)
AST: 15 U/L (ref 15–41)
Albumin: 2.9 g/dL — ABNORMAL LOW (ref 3.5–5.0)
Alkaline Phosphatase: 69 U/L (ref 38–126)
Anion gap: 10 (ref 5–15)
BUN: 13 mg/dL (ref 6–20)
CO2: 18 mmol/L — ABNORMAL LOW (ref 22–32)
Calcium: 8.2 mg/dL — ABNORMAL LOW (ref 8.9–10.3)
Chloride: 106 mmol/L (ref 101–111)
Creatinine, Ser: 0.88 mg/dL (ref 0.61–1.24)
GFR calc Af Amer: >60 mL/min (ref >60)
GFR calc non Af Amer: >60 mL/min (ref >60)
Glucose, Bld: 222 mg/dL — ABNORMAL HIGH (ref 65–99)
Potassium: 3.8 mmol/L (ref 3.5–5.1)
Sodium: 134 mmol/L — ABNORMAL LOW (ref 135–145)
Total Bilirubin: 0.3 mg/dL (ref 0.3–1.2)
Total Protein: 5.4 g/dL — ABNORMAL LOW (ref 6.5–8.1)

## 2015-12-21 LAB — CBC
HCT: 19.9 % — ABNORMAL LOW (ref 39.0–52.0)
Hemoglobin: 5.9 g/dL — CL (ref 13.0–17.0)
MCH: 24.6 pg — ABNORMAL LOW (ref 26.0–34.0)
MCHC: 29.6 g/dL — ABNORMAL LOW (ref 30.0–36.0)
MCV: 82.9 fL (ref 78.0–100.0)
Platelets: 587 10*3/uL — ABNORMAL HIGH (ref 150–400)
RBC: 2.4 MIL/uL — ABNORMAL LOW (ref 4.22–5.81)
RDW: 19.3 % — ABNORMAL HIGH (ref 11.5–15.5)
WBC: 8.3 10*3/uL (ref 4.0–10.5)

## 2015-12-21 LAB — GLUCOSE, CAPILLARY
Glucose-Capillary: 162 mg/dL — ABNORMAL HIGH (ref 65–99)
Glucose-Capillary: 189 mg/dL — ABNORMAL HIGH (ref 65–99)
Glucose-Capillary: 179 mg/dL — ABNORMAL HIGH (ref 65–99)
Glucose-Capillary: 99 mg/dL (ref 65–99)

## 2015-12-21 LAB — PREPARE RBC (CROSSMATCH)

## 2015-12-21 MED ORDER — SODIUM CHLORIDE 0.9 % IV SOLN
INTRAVENOUS | Status: DC
  Administered 2015-12-21 – 2015-12-22 (×2): via INTRAVENOUS

## 2015-12-21 MED ORDER — ATORVASTATIN CALCIUM 40 MG PO TABS
40.0000 mg | ORAL_TABLET | Freq: Every day | ORAL | Status: DC
  Administered 2015-12-21 – 2015-12-25 (×4): 40 mg via ORAL
  Filled 2015-12-21 (×5): qty 1

## 2015-12-21 MED ORDER — PANTOPRAZOLE SODIUM 40 MG IV SOLR
40.0000 mg | Freq: Two times a day (BID) | INTRAVENOUS | Status: DC
  Administered 2015-12-22 – 2015-12-24 (×6): 40 mg via INTRAVENOUS
  Filled 2015-12-21 (×6): qty 40

## 2015-12-21 MED ORDER — SODIUM CHLORIDE 0.9% FLUSH
3.0000 mL | Freq: Two times a day (BID) | INTRAVENOUS | Status: DC
  Administered 2015-12-21 – 2015-12-24 (×5): 3 mL via INTRAVENOUS

## 2015-12-21 MED ORDER — SERTRALINE HCL 25 MG PO TABS
25.0000 mg | ORAL_TABLET | Freq: Every day | ORAL | Status: DC
  Administered 2015-12-21 – 2015-12-25 (×4): 25 mg via ORAL
  Filled 2015-12-21 (×6): qty 1

## 2015-12-21 MED ORDER — PROCHLORPERAZINE MALEATE 10 MG PO TABS
10.0000 mg | ORAL_TABLET | Freq: Four times a day (QID) | ORAL | Status: DC | PRN
  Administered 2015-12-23 – 2015-12-24 (×2): 10 mg via ORAL
  Filled 2015-12-21 (×3): qty 1

## 2015-12-21 MED ORDER — SODIUM CHLORIDE 0.9 % IV BOLUS (SEPSIS)
1000.0000 mL | Freq: Once | INTRAVENOUS | Status: AC
  Administered 2015-12-21: 1000 mL via INTRAVENOUS

## 2015-12-21 MED ORDER — SODIUM CHLORIDE 0.9 % IV SOLN
80.0000 mg | Freq: Once | INTRAVENOUS | Status: AC
  Administered 2015-12-21: 80 mg via INTRAVENOUS
  Filled 2015-12-21: qty 80

## 2015-12-21 MED ORDER — SODIUM CHLORIDE 0.9 % IV SOLN: Freq: Once | INTRAVENOUS | Status: DC

## 2015-12-21 MED ORDER — INSULIN ASPART 100 UNIT/ML ~~LOC~~ SOLN
0.0000 [IU] | Freq: Three times a day (TID) | SUBCUTANEOUS | Status: DC
  Administered 2015-12-21: 2 [IU] via SUBCUTANEOUS
  Administered 2015-12-22: 3 [IU] via SUBCUTANEOUS
  Administered 2015-12-22: 1 [IU] via SUBCUTANEOUS
  Administered 2015-12-23: 5 [IU] via SUBCUTANEOUS
  Administered 2015-12-23: 3 [IU] via SUBCUTANEOUS

## 2015-12-21 MED ORDER — ENSURE ENLIVE PO LIQD
237.0000 mL | Freq: Two times a day (BID) | ORAL | Status: DC
  Administered 2015-12-22 – 2015-12-25 (×2): 237 mL via ORAL

## 2015-12-21 MED ORDER — ACETAMINOPHEN 650 MG RE SUPP: 650.0000 mg | Freq: Four times a day (QID) | RECTAL | Status: DC | PRN

## 2015-12-21 MED ORDER — FERROUS SULFATE 325 (65 FE) MG PO TABS
325.0000 mg | ORAL_TABLET | Freq: Two times a day (BID) | ORAL | Status: DC
  Administered 2015-12-22 – 2015-12-25 (×4): 325 mg via ORAL
  Filled 2015-12-21 (×5): qty 1

## 2015-12-21 MED ORDER — ACETAMINOPHEN 325 MG PO TABS: 650.0000 mg | ORAL_TABLET | Freq: Four times a day (QID) | ORAL | Status: DC | PRN

## 2015-12-21 NOTE — Progress Notes (Signed)
Pt c/o shortness of breath and weakness still. O2 sats = 100%. CBG= 189 at 1:53P- B/P sitting 86/61 and pulse- 94- Dr. Magdalene River given V/S and orders received to start 1000 CC NS bolus (999cc/hr), which was done via his existing saline lock. Pt and wife verbalize understanding of need for fluids due to low b/p. Pt eating modified carb lunch of [place] and drinking water and coffee. Denies chest pain. Support measures provided, pt resting in lounge chair and prefers sitting rather than reclining due to his shortness of breath. Yvonna Alanis, RN,12/21/15, 2:27P

## 2015-12-21 NOTE — Progress Notes (Signed)
   CC: Shortness of breath  HPI:  [name] is a 55 y.o. gentleman with history noted below that presents to the Internal medicine clinic for continued shortness of breath.  Patient states that he was discharged from the hospital earlier this month and a few days after discharge he felt weak and short of breath that has progressively gotten worse for the past 10 days. Exertion makes his symptoms worse.  However, at rest he continues to have shortness of breath. He denies loss of consciousness, fever/chills nausea or vomiting.    Past Medical History:  Diagnosis Date  . Anemia 11/28/2015  . Anxiety   . Arthritis    "hands, back" (11/28/2015)  . Barrett's esophagus 07/01/2013   Without dysplasia on biopsy 09/03/2012. Repeat EGD recommended 08/2015  . Carotid artery stenosis 07/01/2013   Requiring right sided stent   . Chronic pain syndrome 07/01/2013  . Closed head injury with brief loss of consciousness ([place]) 07/22/2010   Head trapped in a hydraulic device at work.  Fracture of orbital bones on right and brief loss of consciousness per report.  . 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 There was likely a strong psycho-social overlay in regard to the cognitive deficits in the form of mood disorder with psychotic features and mixed anxiety symptomatology. His primary tested cognitive deficits are in the areas of attention, executi  . Daily headache "since 07/2010"   constantly  . Degenerative joint disease of cervical spine 07/01/2013  . Dupuytren's contracture of both hands 04/08/2014  . Encounter for antineoplastic chemotherapy 10/05/2015  . Erectile dysfunction associated with type 2 diabetes mellitus ([place]) 07/01/2013  . Fibromyalgia 07/01/2013  . History of blood transfusion 11/28/2015   "suppose to get his first today" (11/28/2015)  . Hyperlipidemia  LDL goal < 100 07/01/2013  . Memory changes    "memory issues" from head injury  . Osteoarthritis of right thumb 10/21/2014  . Peripheral vascular occlusive disease ([place]) 07/01/2013   Requiring 2 arterial stents above the left knee per report  . Pneumonia ~ 2006/2007  . Post traumatic stress disorder 07/01/2013  . Primary lung adenocarcinoma ([place]) dx'd 08/2015   "right lung"  . Severe major depression with psychotic features ([place]) 04/15/2011  . Tobacco abuse 07/01/2013  . Tobacco abuse   . Type 2 diabetes mellitus with vascular disease ([place]) 07/01/2013   Left lower extremity and right carotid stenting    Review of Systems:  As noted per HPI  Physical Exam:  Vitals:   12/21/15 1057  BP: 98/63  Pulse: 99  Temp: 97.7 F (36.5 C)  TempSrc: Oral  SpO2: 100%  Weight: 126 lb 9.6 oz (57.4 kg)  Height: '5\' 7"'$  (1.702 m)   Physical Exam  Constitutional:  Pallor appearing  Eyes:  Pale conjunctiva   Cardiovascular: Normal rate and regular rhythm.  Exam reveals no gallop and no friction rub.   No murmur heard. Pulmonary/Chest: He has no wheezes. He has no rales.  Tachypnea in mild distress   Abdominal:  Tender in epigastric region  Musculoskeletal: He exhibits no edema.    Assessment & Plan:   See encounters tab for problem based medical decision making.   Patient seen with Dr. Dareen Piano

## 2015-12-21 NOTE — Progress Notes (Signed)
Report called to Nurse on [address].  Patient transported via wheelchair .  Alert and oriented.  Iv continues in right arm.  Sander Nephew, RN 12/21/2015 3:15 PM

## 2015-12-21 NOTE — Progress Notes (Signed)
Brett [name] [id number] Admission Data: 12/21/2015 3:53 PM Attending Provider: Aldine Contes, MD  NTZ:GYFVC, Damita Lack, MD Consults/ Treatment Team:   Brett [name] is a 55 y.o. male patient admitted from ED awake, alert  & orientated  X 3,  Full Code, VSS - There were no vitals taken for this visit., O2   \[id number]\. Tele # 1 placed and pt is currently running:normal sinus rhythm.   IV site WDL:  forearm left, condition patent and no redness with a transparent dsg that's clean dry and intact.  Allergies:   Allergies  Allergen Reactions  . Gabapentin Other (See Comments)    suicidal thoughts  . Celebrex [Celecoxib] Other (See Comments)    Nightmares     Past Medical History:  Diagnosis Date  . Anemia 11/28/2015  . Anxiety   . Arthritis    "hands, back" (11/28/2015)  . Barrett's esophagus 07/01/2013   Without dysplasia on biopsy 09/03/2012. Repeat EGD recommended 08/2015  . Carotid artery stenosis 07/01/2013   Requiring right sided stent   . Chronic pain syndrome 07/01/2013  . Closed head injury with brief loss of consciousness ([place]) 07/22/2010   Head trapped in a hydraulic device at work.  Fracture of orbital bones on right and brief loss of consciousness per report.  . 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 There was likely a strong psycho-social overlay in regard to the cognitive deficits in the form of mood disorder with psychotic features and mixed anxiety symptomatology. His primary tested cognitive deficits are in the areas of attention, executi  . Daily headache "since 07/2010"   constantly  . Degenerative joint disease of cervical spine 07/01/2013  . Dupuytren's contracture of both hands 04/08/2014  . Encounter for antineoplastic chemotherapy 10/05/2015  . Erectile dysfunction associated with  type 2 diabetes mellitus ([place]) 07/01/2013  . Fibromyalgia 07/01/2013  . History of blood transfusion 11/28/2015   "suppose to get his first today" (11/28/2015)  . Hyperlipidemia LDL goal < 100 07/01/2013  . Memory changes    "memory issues" from head injury  . Osteoarthritis of right thumb 10/21/2014  . Peripheral vascular occlusive disease ([place]) 07/01/2013   Requiring 2 arterial stents above the left knee per report  . Pneumonia ~ 2006/2007  . Post traumatic stress disorder 07/01/2013  . Primary lung adenocarcinoma ([place]) dx'd 08/2015   "right lung"  . Severe major depression with psychotic features ([place]) 04/15/2011  . Tobacco abuse 07/01/2013  . Tobacco abuse   . Type 2 diabetes mellitus with vascular disease ([place]) 07/01/2013   Left lower extremity and right carotid stenting    History:  obtained from the patient. Tobacco/alcohol: Former smoker  Pt orientation to unit, room and routine. Information packet given to patient/family and safety video watched.  Admission INP armband ID verified with patient/family, and in place. SR up x 2, fall risk assessment complete with Patient and family verbalizing understanding of risks associated with falls. Pt verbalizes an understanding of how to use the call bell and to call for help before getting out of bed.  Skin, clean-dry- intact without evidence of bruising, or skin tears.   No evidence of skin break down noted on exam. no rashes, no ecchymoses    Will cont to monitor and assist as needed.  Brett Deleo Margaretha Sheffield, RN 12/21/2015 3:53 PM

## 2015-12-21 NOTE — H&P (Signed)
Date: 12/21/2015               Patient Name:  Brett [name] MRN: [id number]  DOB: [birth year] Age / Sex: 55 y.o., male   PCP: [name], MD         Medical Service: Internal Medicine Teaching Service         Attending Physician: Dr. Aldine Contes, MD    First Contact: Dr. Jari Favre Pager: [phone]  Second Contact: Dr. Charlynn Grimes Pager: [phone]       After Hours (After 5p/  First Contact Pager: [phone]  weekends / holidays): Second Contact Pager: [phone]   Chief Complaint: Shortness of breath and weakness  History of Present Illness: [name] [name] is a 55yo male with PMH of T2DM, MDD, TBI, lung adenocarcinoma stage 1A T1bN0M0 s/p RULx in 09/2015, and symptomatic anemia presenting to the clinic with increasing shortness of breath, weakness and dizziness since recent discharge 2 weeks prior.   Patient was recently discharge with symptomatic anemia on 11/09 and 11/17 with presumed slow GI bleed but no localization on EGD or colonoscopy. Before first admission he had endorsed darker stools. During those admissions, he required transfusions after which his hemoglobin remained stable until follow up in clinic. He has also received one dose of IV iron at last admission and was started on oral iron replacement at discharge; FOBT was positive but was obtained after iron was started. Patient denies NSAID use (last being months ago). He is on aspirin and plavix following carotid and femoral stents in 2015. He has a history of barrett's esophagus, but again this EGD on 11/07 was unremarkable.    Patient endorses that about 2 days after he was discharge last, he began having symptoms of progressive shortness of breath, weakness, dizziness on changing position from lying to sitting and sitting to standing, and chest discomfort. He also has some localized epigastric pain that radiates to his chest. He still endorses dark stools since starting iron, denies BRBP rectum, or other signs of bleeding. He  denies fevers, chills, nausea, vomiting, diarrhea, or constipation; he denies hematuria, epistaxis, gum bleeding or bruising.   On 11/17 discharge, patient hemoglobin was 8.4.  In the clinic, patient was orthostatic and received 1L NS bolus.   Meds:  No outpatient prescriptions have been marked as taking for the 12/21/15 encounter [facility] Encounter).   Aspirin 68m daily Atorvastatin 416mdaily Clopidogrel 7562maily Ferrous sulfate 325m11mD Glipizide 20mg55m Lisinopril 2.5mg d62my Metformin 500mg B73mantoprazole 40mg da75mProchlorperazine 10mg q6h58mN for nausea/vomiting Sertraline 25mg dail47mtagliptin 100mg daily51mlergies: Allergies as of 12/21/2015 - Review Complete 12/21/2015  Allergen Reaction Noted  . Gabapentin Other (See Comments) 05/26/2013  . Celebrex [celecoxib] Other (See Comments) 07/01/2013   Past Medical History:  Diagnosis Date  . Anemia 11/28/2015  . Anxiety   . Arthritis    "hands, back" (11/28/2015)  . Barrett's esophagus 07/01/2013   Without dysplasia on biopsy 09/03/2012. Repeat EGD recommended 08/2015  . Carotid artery stenosis 07/01/2013   Requiring right sided stent   . Chronic pain syndrome 07/01/2013  . Closed head injury with brief loss of consciousness ([facility]) 07/01/[place]   Head trapped in a hydraulic device at work.  Fracture of orbital bones on right and brief loss of consciousness per report.  . 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 There was  likely a strong psycho-social overlay in regard to the cognitive deficits in the form of mood disorder with psychotic features and mixed anxiety symptomatology. His primary tested cognitive deficits are in the areas of attention, executi  . Daily headache "since 07/2010"   constantly  . Degenerative joint disease of cervical spine 07/01/2013  . Dupuytren's  contracture of both hands 04/08/2014  . Encounter for antineoplastic chemotherapy 10/05/2015  . Erectile dysfunction associated with type 2 diabetes mellitus ([place]) 07/01/2013  . Fibromyalgia 07/01/2013  . History of blood transfusion 11/28/2015   "suppose to get his first today" (11/28/2015)  . Hyperlipidemia LDL goal < 100 07/01/2013  . Memory changes    "memory issues" from head injury  . Osteoarthritis of right thumb 10/21/2014  . Peripheral vascular occlusive disease ([place]) 07/01/2013   Requiring 2 arterial stents above the left knee per report  . Pneumonia ~ 2006/2007  . Post traumatic stress disorder 07/01/2013  . Primary lung adenocarcinoma ([place]) dx'd 08/2015   "right lung"  . Severe major depression with psychotic features ([place]) 04/15/2011  . Tobacco abuse 07/01/2013  . Tobacco abuse   . Type 2 diabetes mellitus with vascular disease ([place]) 07/01/2013   Left lower extremity and right carotid stenting    Family History: Mother with heart disease and DM, father with MI, siblings with heart disease  Social History: Former smoker, recently quit 08/2015 before lobectomy, 35 pack year history, denies alcohol use, denies illicit drug use.  Review of Systems: A complete ROS was negative except as per HPI.   Physical Exam: Blood pressure (!) 99/56, pulse 90, temperature 97.9 F (36.6 C), resp. rate 18. Constitutional: NAD, pleasant, cachectic appearing Eyes: conjunctival pallor, no scleral icterus ENT: moist mucous membranes, no oropharyngeal erythema or exudates, no mucosal bleeding appreciated CV: RRR, no murmurs, rubs or gallops appreciated, pulses intact, no LE edema Resp: CTAB, mild to mod increase work of breathing, no wheezing or crackles appreciated, bilateral chest rise and breath sounds appreciated throughout Abd: epigastric and RUQ tenderness, +BS, nondistended MSK: mild sternal tenderness Neuro: CN 2-12 intact Psych: A&O x3  Labs: Na 134, K 3.8, Cl 106, CO2 18, Glu 222, BUN 13,  Cr 0.88, Ca 8.2, Prt 5.4, Alb 2.9, AST 15, ALT 11, Alk phos 69, Anion gap 10 WBC 8.3, RBC 2.4, Hgb 5.9, Hct 19.9, MCV 82.9, RDW 19.3, Plt 587.   EGD 11/30/2015:  - Normal esophagus. - Normal stomach. - Normal examined duodenum. - No specimens collected  Colonoscopy 11/30/2015: - The entire examined colon is normal on direct and retroflexion views  ECHO 12/08/2015: Left ventricle: The cavity size was normal. Wall thickness was   normal. Systolic function was normal. The estimated ejection   fraction was in the range of 55% to 60%. Wall motion was normal;   there were no regional wall motion abnormalities. Left   ventricular diastolic function parameters were normal for the   patient's age.  Assessment & Plan by Problem: Active Problems:   GI bleed  Symptomatic anemia: Patient with recurrent symptomatic anemia presumed from slow GI bleed that was unable to be localized on EGD and colonoscopy on 11/09. Hgb at last discharge was 8.4; today it was 5.9. During last hospitalization labs revealed no signs of hemolysis or delayed hemolytic transfusion reaction. Peripheral blood smear was reviewed and showed mixed population of hypochromic microcytic RBCs with spherocytes, no schistocytes, and increased platelet count. Reticulocytes were appropriately elevated at 4.3%, DAT negative, LDH normal. Patient continues to have reactive  thrombocytosis.  --Type and cross --transfuse 2U pRBC --f/u post transfusion CBC, transfuse if Hgb <7 --IV protonix 42m q12hrs --GI consult for possible capsule endoscopy inpatient - appreciate their recommendations --d/c ASA and plavix as patient is recurrently anemic despite transfusion and presumed GI bleed. No evidence for continued anti-platelet therapy for carotid stents (his was placed in 2015) --f/u AM CBC  Hypotension: Patient with BP in 80's/60's in clinic symptomatic on standing with dizziness presumed from anemia. --1L NS bolus in clinic --IVF  1012mhr  T2DM: Patient with T2DM on home regimen on glipizide 2046mID, metformin 500m81mD and sitagliptin 100mg20mly.  --SSI-s TID wc --f/u AM BMP  PVD: Patient is s/p carotid and femoral stents in 2015 and was on aspirin 81mg 45my and plavix 75mg d25m. No clear data on continued anti-platelets, and in setting of bleeding and acute anemia we will discontinue these.  --d/c aspirin and plavix  HLD: Patient with HLD on home regimen of atorvastatin 40mg da70m --continue atorvastatin 40mg dai92mMDD: Patient on home regimen of zoloft 25mg dail62m-continue zoloft 25mg daily33mVF: 100ml/hr NS 73m: HH/CM; NPO past midnight DVT ppx: SCD's Code: FULL  Dispo: Admit patient to Inpatient with expected length of stay greater than 2 midnights.  Signed: Maythe Deramo SvaliAlphonzo [email or web address], 6:38 PM  Pager [phone]

## 2015-12-21 NOTE — H&P (Signed)
Date: 12/21/2015               Patient Name:  Brett [name] MRN: [id number]  DOB: 1960-03-14 Age / Sex: 55 y.o., male   PCP: [name], MD              Medical Service: Internal Medicine Teaching Service              Attending Physician: Dr. Aldine Contes, MD    First Contact: Karma Lew, [place] 3 Pager: [phone]  Second Contact: Dr. Jari Favre Pager: [phone]  Third Contact Dr. Charlynn Grimes Pager: [phone]       After Hours (After 5p/  First Contact Pager: [phone]  weekends / holidays): Second Contact Pager: [phone]   Chief Complaint: Dyspnea  History of Present Illness: Mr Jacon, Whetzel is a 55 yo male with a PMH of T2DM, tobacco abuse, MDD, TBI, and lung adenocarcinoma (Stage IA T1bN0M0 s/p RULx in 09/2015)who presented from clinic with shortness of breath. The patient was recently discharged from our unit on 11/17 after presenting with SOB, hypotension and anemia. His anemia significantly worsened after fluid resuscitation, which required one unit pRBCs. It was determined that his anemia was secondary to iron deficiency, and after discovering occult blood in his stool, a colonoscopy and EGD was performed. These studies were unrevealing. He was discharged with oral iron and a outpatient capsule endoscopy. Two days after discharge, the patient developed shortness of breath to the point where he would have difficulty going from a lying to a sitting position or sitting to standing. When sitting up or standing, he becomes weak and dizzy. He admits to losing vision on some occasions while standing. He reports that on Tuesday he almost fell due to the weakness. He denies any LOC. He says he has now developed difficulty breathing and chest pain while sitting and prefers to sit forward to aid in his breathing. He thinks his chest pain is due to his breathing but when he describes it, he refers it as crushing and diffusely over his chest. He does not have radiation to his jaw or arm. Leaning forward  alleviates this as well. He came into the clinic today because he could not preform any of his daily activities and had to focus solely on breathing.  He denies any recent fevers, chills, cough congestion or abdominal pain. He does admit to pain on his sternum that is reproducible to palpitation. He tolerates a normal diet and his bowels have not changed and hasn't noticed any blood in his stool or urine. He says he uses ibuprofen occasionally for arthritis but hasn't used it in a while. He denies any new trauma, rashes, or wounds.   In the clinic the patient a BP of 86/56, HR of 90, was afebrile at 97.7 and had a RR of 24. He was had a CBC that was significant for a Hgb 5.9 (Hgb of  8.4 at discharge). He was started on IVF and admitted to the floor.   Meds: Current Facility-Administered Medications  Medication Dose Route Frequency Provider Last Rate Last Dose  . 0.9 %  sodium chloride infusion   Intravenous Once Maryellen Pile, MD      . 0.9 %  sodium chloride infusion   Intravenous Continuous Maryellen Pile, MD 100 mL/hr at 12/21/15 1608    . insulin aspart (novoLOG) injection 0-9 Units  0-9 Units Subcutaneous TID WC Maryellen Pile, MD      . pantoprazole (PROTONIX) 80 mg in sodium chloride  0.9 % 100 mL IVPB  80 mg Intravenous Once Maryellen Pile, MD      . Derrill Memo ON 12/22/2015] pantoprazole (PROTONIX) injection 40 mg  40 mg Intravenous Q12H Maryellen Pile, MD      . sodium chloride flush (NS) 0.9 % injection 3 mL  3 mL Intravenous Q12H Maryellen Pile, MD        Allergies: Allergies as of 12/21/2015 - Review Complete 12/21/2015  Allergen Reaction Noted  . Gabapentin Other (See Comments) 05/26/2013  . Celebrex [celecoxib] Other (See Comments) 07/01/2013   Past Medical History:  Diagnosis Date  . Anemia 11/28/2015  . Anxiety   . Arthritis    "hands, back" (11/28/2015)  . Barrett's esophagus 07/01/2013   Without dysplasia on biopsy 09/03/2012. Repeat EGD recommended 08/2015  . Carotid artery  stenosis 07/01/2013   Requiring right sided stent   . Chronic pain syndrome 07/01/2013  . Closed head injury with brief loss of consciousness ([place]) 07/22/2010   Head trapped in a hydraulic device at work.  Fracture of orbital bones on right and brief loss of consciousness per report.  . 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 There was likely a strong psycho-social overlay in regard to the cognitive deficits in the form of mood disorder with psychotic features and mixed anxiety symptomatology. His primary tested cognitive deficits are in the areas of attention, executi  . Daily headache "since 07/2010"   constantly  . Degenerative joint disease of cervical spine 07/01/2013  . Dupuytren's contracture of both hands 04/08/2014  . Encounter for antineoplastic chemotherapy 10/05/2015  . Erectile dysfunction associated with type 2 diabetes mellitus ([place]) 07/01/2013  . Fibromyalgia 07/01/2013  . History of blood transfusion 11/28/2015   "suppose to get his first today" (11/28/2015)  . Hyperlipidemia LDL goal < 100 07/01/2013  . Memory changes    "memory issues" from head injury  . Osteoarthritis of right thumb 10/21/2014  . Peripheral vascular occlusive disease ([place]) 07/01/2013   Requiring 2 arterial stents above the left knee per report  . Pneumonia ~ 2006/2007  . Post traumatic stress disorder 07/01/2013  . Primary lung adenocarcinoma ([place]) dx'd 08/2015   "right lung"  . Severe major depression with psychotic features ([place]) 04/15/2011  . Tobacco abuse 07/01/2013  . Tobacco abuse   . Type 2 diabetes mellitus with vascular disease ([place]) 07/01/2013   Left lower extremity and right carotid stenting   Past Surgical History:  Procedure Laterality Date  . CAROTID STENT Right ?2014  . COLONOSCOPY N/A 11/30/2015   Procedure: COLONOSCOPY;  Surgeon: Teena Irani, MD;   [facility];  Service: Endoscopy;  Laterality: N/A;  . ESOPHAGOGASTRODUODENOSCOPY N/A 11/30/2015   Procedure: ESOPHAGOGASTRODUODENOSCOPY (EGD);  Surgeon: Teena Irani, MD;  Location: [facility] ENDOSCOPY;  Service: Endoscopy;  Laterality: N/A;  . FEMORAL ARTERY STENT Left 05/2012; ~ 2015   Archie Endo 06/04/2012; Raechel Chute report  . FRACTURE SURGERY    . HARDWARE REMOVAL Right 11/15/2011   Removal of deep frontozygomatic orbital hardware/notes 11/15/2011  . HERNIA REPAIR  4193   Umbilical  . ORIF ORBITAL FRACTURE Right 08/15/2010    caught in a hydraulic machine; open reduction internal fixation of orbital rim fracture and open reduction of zygomatic arch fracture  Archie Endo 10/13/2009  . VIDEO ASSISTED THORACOSCOPY (VATS)/ LOBECTOMY Right 10/18/2015   Procedure: VIDEO ASSISTED THORACOSCOPY (VATS)/ LOBECTOMY;  Surgeon: Melrose Nakayama, MD;  Location:  [facility] OR;  Service: Thoracic;  Laterality: Right;  Marland Kitchen VIDEO BRONCHOSCOPY Bilateral 09/21/2015   Procedure: VIDEO BRONCHOSCOPY WITH FLUORO;  Surgeon: Juanito Doom, MD;  Location: [facility] ENDOSCOPY;  Service: Cardiopulmonary;  Laterality: Bilateral;   Family History  Problem Relation Age of Onset  . Heart disease Mother   . Diabetes Mother   . Diabetes Father   . Heart attack Father   . Diabetes Sister   . Coronary artery disease Sister     s/p CABG  . Diabetes Brother   . Healthy Daughter   . Healthy Son   . Diabetes Sister   . Healthy Sister   . Healthy Sister   . Diabetes Brother   . Coronary artery disease Brother     s/p CABG  . Diabetes Brother   . Healthy Brother   . Healthy Brother   . Healthy Daughter    Social History   Social History  . Marital status: Married    Spouse name: N/A  . Number of children: N/A  . Years of education: N/A   Occupational History  . Not on file.   Social History Main Topics  . Smoking status: Former Smoker    Packs/day: 1.00    Years: 35.00    Types: Cigarettes    Quit date: 10/18/2015  .  Smokeless tobacco: Never Used     Comment: Quit at time of RUL lobectomy for adenocarcinoma  . Alcohol use 0.0 oz/week     Comment: 11/28/2015 "might drink 1, 6 pack of beer/year"  . Drug use: No  . Sexual activity: Not Currently    Birth control/ protection: None     Comment: Wife had tubes tied   Other Topics Concern  . 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  Currently unemployed.  Finished 9th grade.  Lives with wife, 2 daughters, and mother-in-law in [place].    Review of Systems: Constitutional: positive for weakness. Negative for fever chills and weight loss  Ears, nose, mouth, throat, and face: Positive for occasional changes in vision with standing. Negative for congestion cough or sore throat Respiratory: negative for SOB or cough Cardiovascular: Positive for chest pain. Negative for palpitations Gastrointestinal: No nausea vomiting or diarrhea.  Genitourinary: Negative for dysuria Hematologic/lymphatic: Denies easy bruising Musculoskeletal: Positive for joint pain throughout. Neurological: Positive for headaches that has been occurring since his TBI Endocrine: positive for weight change Allergic/Immunologic: Negative for allergies or autoimmune diseases  Physical Exam: Blood pressure (!) 99/56, pulse 90, temperature 97.9 F (36.6 C), resp. rate 18. Gen: Patient is sitting up in the chair, tripoding, working hard to breathe. Pale HEENT: normocephalic and atraumatic. Pupils are equally round and reactive to light. Dry mucus membranes. Anicteric. No oral lesions present. Tongue is normal size and dry.   Cv: Regular Rate and rhythm. NL s1 s2 with no heaves rubs or murmurs. Tender to palpation on his sternum.  Vascular:. Radial, dorsalis pedis and posterior tibial pulses 2+.  Resp: Normal work of breathing. Clear to auscultation  bilaterally. Tympanic percussion throughout.  Abdomen: No abdominal scars present. Non distended. Normal bowel sounds. Not tender to palpitation. Symmetric percussion throughout. No organomegaly or masses.  Neuro: Oriented x3. Normal visual acuity. EOM intact. Facial motor symmetry. Facial sensation is symmetric. Uvula and tongue are midline. Grossly normal strength throughout. Normal tibial reflexes  and appropriate dexterity.  Psych: normal mood and affect.  Ext: Cool extremities. No LE edema. No cyanosis or clubbing. No rashes present. Cap refill>3secs. Pale digits MSK: No obvious deformed joints.  Lab results: CBC was notable for an HH of 5.9/19.9. Platelets are elevated at 587. CMP was notable for a albumin and total protein of 2.9 and 5.4. Sodium was 134.   Assessment & Plan by Problem: Active Problems:   GI bleed  Mr Matis, Monnier is a 55 yo male with a PMH of T2DM, tobacco abuse, MDD, TBI, and lung adenocarcinoma (Stage IA T1bN0M0 s/p RULx in 09/2015)who presented from clinic with anemia, shortness of breath, and hypotension.   GI Bleed with Anemia- The patient's recent hospitalization for a GI bleed with anemia without a determined source, and use of ASA and plavix,  most likely points to a recurrent GI Bleed. Given the undetermined source from last admission, the patient most likely is suffering from the same bleed. GI will be consulted to determine a plan to prevent further bleeds. Given the patient is symptomatic, the patient will receive a blood transfusion.   - GI consult to perform capsule endoscopy  - 2 units pRBCs  - fecal occult   - CBC qd  - BMP qd  - ferritin   - oral iron  - Pantoprazole 40 mg q12 Orthostatic Hypotension - Most lilkely 2/2 to GI bleed with acute anemia. Will reassess as fluid resuscitation and blood transfusions.   - 100 ml/hr NS   - vitals q4  Dyspnea and chest pain - Most likely 2/2 to anemia and lower lung reserve. Sating 100% on Ra. Will  reassess as anemia improves  T2DM, takes Glipizide 20 BID, Metformin 500 BID, Januvia 100 QD  - SSI   Stage IA primary lung adeno, T1bN0M0 s/p lobectomy on 10/18/2015 - f/up with Dr. Earlie Server  Depression  - home Zoloft  HLD  - home Lipitor   Peripheral vascular occlusive disease s/p carotid stent - Stop Asa, Plavix  TBI w/ daily headaches - prn Tylenol   FENGI   - HH diet  - colace '100mg'$  qd PRN  - strict I/Os  - daily weights  Dispo  - Per GI endoscopy and patient anemia.   This is a Careers information officer Note.  The care of the patient was discussed with Dr. Jari Favre and the assessment and plan was formulated with their assistance.  Please see their note for official documentation of the patient encounter.   Signed: Benn Moulder, Medical Student 12/21/2015, 5:08 PM

## 2015-12-22 ENCOUNTER — Encounter (HOSPITAL_COMMUNITY)
Admission: AD | Disposition: A | Discharge status: Home / Self Care | Payer: Self-pay | Source: Ambulatory Visit | Attending: Internal Medicine

## 2015-12-22 ENCOUNTER — Encounter (HOSPITAL_COMMUNITY): Payer: Self-pay | Admitting: *Deleted

## 2015-12-22 DIAGNOSIS — Z79899 Other long term (current) drug therapy: Secondary | ICD-10-CM

## 2015-12-22 DIAGNOSIS — Z9889 Other specified postprocedural states: Secondary | ICD-10-CM

## 2015-12-22 DIAGNOSIS — D62 Acute posthemorrhagic anemia: Secondary | ICD-10-CM

## 2015-12-22 DIAGNOSIS — E785 Hyperlipidemia, unspecified: Secondary | ICD-10-CM

## 2015-12-22 DIAGNOSIS — E1151 Type 2 diabetes mellitus with diabetic peripheral angiopathy without gangrene: Secondary | ICD-10-CM

## 2015-12-22 DIAGNOSIS — Z902 Acquired absence of lung [part of]: Secondary | ICD-10-CM

## 2015-12-22 DIAGNOSIS — F329 Major depressive disorder, single episode, unspecified: Secondary | ICD-10-CM

## 2015-12-22 DIAGNOSIS — Z7984 Long term (current) use of oral hypoglycemic drugs: Secondary | ICD-10-CM

## 2015-12-22 DIAGNOSIS — C3411 Malignant neoplasm of upper lobe, right bronchus or lung: Secondary | ICD-10-CM

## 2015-12-22 DIAGNOSIS — Z95828 Presence of other vascular implants and grafts: Secondary | ICD-10-CM

## 2015-12-22 HISTORY — PX: GIVENS CAPSULE STUDY: SHX5432

## 2015-12-22 LAB — BASIC METABOLIC PANEL
Anion gap: 8 (ref 5–15)
BUN: 9 mg/dL (ref 6–20)
CO2: 22 mmol/L (ref 22–32)
Calcium: 8.2 mg/dL — ABNORMAL LOW (ref 8.9–10.3)
Chloride: 107 mmol/L (ref 101–111)
Creatinine, Ser: 0.66 mg/dL (ref 0.61–1.24)
GFR calc Af Amer: >60 mL/min (ref >60)
GFR calc non Af Amer: >60 mL/min (ref >60)
Glucose, Bld: 118 mg/dL — ABNORMAL HIGH (ref 65–99)
Potassium: 3.9 mmol/L (ref 3.5–5.1)
Sodium: 137 mmol/L (ref 135–145)

## 2015-12-22 LAB — GLUCOSE, CAPILLARY
Glucose-Capillary: 126 mg/dL — ABNORMAL HIGH (ref 65–99)
Glucose-Capillary: 126 mg/dL — ABNORMAL HIGH (ref 65–99)
Glucose-Capillary: 103 mg/dL — ABNORMAL HIGH (ref 65–99)
Glucose-Capillary: 108 mg/dL — ABNORMAL HIGH (ref 65–99)
Glucose-Capillary: 250 mg/dL — ABNORMAL HIGH (ref 65–99)
Glucose-Capillary: 261 mg/dL — ABNORMAL HIGH (ref 65–99)

## 2015-12-22 LAB — CBC
HCT: 26.9 % — ABNORMAL LOW (ref 39.0–52.0)
Hemoglobin: 8.5 g/dL — ABNORMAL LOW (ref 13.0–17.0)
MCH: 26.9 pg (ref 26.0–34.0)
MCHC: 31.6 g/dL (ref 30.0–36.0)
MCV: 85.1 fL (ref 78.0–100.0)
Platelets: 489 10*3/uL — ABNORMAL HIGH (ref 150–400)
RBC: 3.16 MIL/uL — ABNORMAL LOW (ref 4.22–5.81)
RDW: 16.5 % — ABNORMAL HIGH (ref 11.5–15.5)
WBC: 8 10*3/uL (ref 4.0–10.5)

## 2015-12-22 LAB — TYPE AND SCREEN
ABO/RH(D): A POS
Antibody Screen: NEGATIVE
Unit division: 0
Unit division: 0

## 2015-12-22 SURGERY — IMAGING PROCEDURE, GI TRACT, INTRALUMINAL, VIA CAPSULE
Anesthesia: LOCAL

## 2015-12-22 SURGICAL SUPPLY — 1 items: TOWEL COTTON PACK 4EA (MISCELLANEOUS) ×4 IMPLANT

## 2015-12-22 NOTE — Evaluation (Signed)
Occupational Therapy Evaluation and Discharge Patient Details Name: Brett [name] MRN: [id number] DOB: [birth year] Today's Date: 12/22/2015    History of Present Illness 55yo male with PMH of T2DM, MDD, TBI, lung adenocarcinoma stage 1A T1bN0M0 s/p RULx in 09/2015, and symptomatic anemia presenting to the clinic with increasing shortness of breath, weakness and dizziness since recent discharge 2 weeks prior.    Clinical Impression   Pt reports he was independent with BADL PTA; wife does household management. Currently pt overall supervision for ADL and functional mobility. Pt reports no dizziness with positional changes and no SOB noted with functional activities. Educated pt on home safety and energy conservation strategies. Pt planning to d/c home with 24/7 supervision from family. No further acute OT needs identified; signing off at this time. Please re-consult if needs change. Thank you for this referral.    Follow Up Recommendations  No OT follow up;Supervision - Intermittent    Equipment Recommendations  None recommended by OT    Recommendations for Other Services       Precautions / Restrictions Precautions Precautions: None Restrictions Weight Bearing Restrictions: No      Mobility Bed Mobility Overal bed mobility: Modified Independent             General bed mobility comments: HOB elevated, no use of bed rails. Increased time required.  Transfers Overall transfer level: Needs assistance Equipment used: None Transfers: Sit to/from Stand Sit to Stand: Supervision         General transfer comment: Supervision for safety. No dizziness or unsteadiness.    Balance Overall balance assessment: No apparent balance deficits (not formally assessed)                                          ADL Overall ADL's : Needs assistance/impaired Eating/Feeding: NPO   Grooming: Supervision/safety;Standing   Upper Body Bathing: Set up;Sitting   Lower  Body Bathing: Supervison/ safety;Sit to/from stand   Upper Body Dressing : Set up;Sitting   Lower Body Dressing: Supervision/safety;Sit to/from stand   Toilet Transfer: Supervision/safety;Ambulation;Regular Glass blower/designer Details (indicate cue type and reason): Simulated by sit to stand from EOB with functional mobility in room.         Functional mobility during ADLs: Supervision/safety General ADL Comments: Educated pt on home safety and fall prevention strategies. Encouraged frequent mobility thrhoughout the day; pt verbalized understanding. No dizziness or SOB with positional changes or functional mobility.      Vision Vision Assessment?: No apparent visual deficits   Perception     Praxis      Pertinent Vitals/Pain Pain Assessment: Faces Faces Pain Scale: Hurts a little bit     Hand Dominance     Extremity/Trunk Assessment Upper Extremity Assessment Upper Extremity Assessment: Overall WFL for tasks assessed   Lower Extremity Assessment Lower Extremity Assessment: Defer to PT evaluation   Cervical / Trunk Assessment Cervical / Trunk Assessment: Normal   Communication Communication Communication: Other (comment) (occasional word finding difficulties)   Cognition Arousal/Alertness: Awake/alert Behavior During Therapy: WFL for tasks assessed/performed Overall Cognitive Status: Within Functional Limits for tasks assessed                     General Comments       Exercises       Shoulder Instructions      Home Living Family/patient expects to  be discharged to:: Private residence Living Arrangements: Spouse/significant other;Children Available Help at Discharge: Family;Available 24 hours/day Type of Home: House Home Access: Stairs to enter [employer] of Steps: 2 Entrance Stairs-Rails: Right;Left Home Layout: One level     Bathroom Shower/Tub: Occupational psychologist: Standard     Home Equipment: Cane - single  point;Shower seat - built in;Grab bars - tub/shower          Prior Functioning/Environment Level of Independence: Independent                 OT Problem List:     OT Treatment/Interventions:      OT Goals(Current goals can be found in the care plan section) Acute Rehab OT Goals Patient Stated Goal: get better OT Goal Formulation: All assessment and education complete, DC therapy  OT Frequency:     Barriers to D/C:            Co-evaluation              End of Session Nurse Communication: Mobility status  Activity Tolerance: Patient tolerated treatment well Patient left: in chair;with call bell/phone within reach   Time: 4827-0786 OT Time Calculation (min): 21 min Charges:  OT General Charges $OT Visit: 1 Procedure OT Evaluation $OT Eval Moderate Complexity: 1 Procedure G-Codes:     Binnie Kand M.S., OTR/L Pager: [phone]  12/22/2015, 9:32 AM

## 2015-12-22 NOTE — Progress Notes (Signed)
Physical Therapy Evaluation Patient Details Name: Brett [name] MRN: [id number] DOB: [birth year] Today's Date: 12/22/2015   History of Present Illness  55yo male with PMH of T2DM, MDD, TBI, lung adenocarcinoma stage 1A T1bN0M0 s/p RULx in 09/2015, and symptomatic anemia presenting to the clinic with increasing shortness of breath, weakness and dizziness since recent discharge 2 weeks prior.   Clinical Impression  Patient ambulated 200' w/o loss of balance or shortness of breath. He appears to be at baseline mobility. D/C to HEP.     Follow Up Recommendations No PT follow up    Equipment Recommendations       Recommendations for Other Services       Precautions / Restrictions Precautions Precautions: None Restrictions Weight Bearing Restrictions: No      Mobility  Bed Mobility Overal bed mobility: Modified Independent             General bed mobility comments: HOB elevated, no use of bed rails. Increased time required.  Transfers Overall transfer level: Needs assistance Equipment used: None   Sit to Stand: Independent         General transfer comment: . No dizziness or unsteadiness.  Ambulation/Gait Ambulation/Gait assistance: Independent Ambulation Distance (Feet): 200 Feet Assistive device: None       General Gait Details: No loss of balance with gait. No shortness of breath   Stairs            Wheelchair Mobility    Modified Rankin (Stroke Patients Only)       Balance Overall balance assessment: No apparent balance deficits (not formally assessed)                                           Pertinent Vitals/Pain Pain Assessment: 0-10 Faces Pain Scale: No hurt    Home [name] Family/patient expects to be discharged to:: Private residence [name] Arrangements: Spouse/significant other;Children Available Help at Discharge: Family;Available 24 hours/day Type of Home: House Home Access: Stairs to enter Entrance  Stairs-Rails: Psychiatric nurse of Steps: 2 Home Layout: One level Home Equipment: Cane - single point;Shower seat - built in;Grab bars - tub/shower      Prior Pension scheme manager        Extremity/Trunk Assessment   Upper Extremity Assessment: Defer to OT evaluation           Lower Extremity Assessment: Overall WFL for tasks assessed      Cervical / Trunk Assessment: Normal  Communication      Cognition Arousal/Alertness: Awake/alert Behavior During Therapy: WFL for tasks assessed/performed Overall Cognitive Status: Within Functional Limits for tasks assessed                      General Comments      Exercises     Assessment/Plan    PT Assessment Patent does not need any further PT services  PT Problem List            PT Treatment Interventions      PT Goals (Current goals can be found in the Care Plan section)  Acute Rehab PT Goals Patient Stated Goal: get better PT Goal Formulation: With patient Time For Goal Achievement: 01/05/16 Potential to Achieve Goals: Good    Frequency     Barriers to  discharge        Co-evaluation               End of Session Equipment Utilized During Treatment: Gait belt Activity Tolerance: Patient tolerated treatment well Patient left: in chair;with family/visitor present Nurse Communication: Mobility status         Time: 1340-1355 PT Time Calculation (min) (ACUTE ONLY): 15 min   Charges:   PT Evaluation $PT Eval Low Complexity: 1 Procedure     PT G Codes:        Brett [name] December 27, 2015, 4:08 PM

## 2015-12-22 NOTE — Progress Notes (Signed)
Pt swallowed capsule for endoscopy '@0855'$ .  Pt and bedside RN given instructions.    Vista Lawman, RN

## 2015-12-22 NOTE — Progress Notes (Signed)
  Date: 12/22/2015  Patient name: Brett [name]  Medical record number: [id number]  Date of birth: 03/26/1960   I have seen and evaluated Lynnae Prude and discussed their care with the Residency Team. In brief, patient is a 55 year old male with a past medical history of type 2 diabetes, traumatic brain injury, lung adenocarcinoma stage IA status post right upper lobectomy in September 2017 and recurrent symptomatic anemia over the past month. Patient was seen in internal medicine clinic yesterday for follow-up from a recent hospital admission for symptomatic anemia. Patient states that since his discharge he has noted progressively worsening shortness of breath and weakness similar to his previous episodes of anemia. He also complains of lightheadedness on standing and difficulty walking secondary to his weakness. She denies any melena or hematochezia. No hematemesis, no nausea or vomiting, no chest pain, no headache, no palpitations, no syncope. Patient does complain of 2-3 episodes of near syncope on ambulation especially over the last week. No fevers or chills, no diarrhea, no constipation, no epistaxis, no gum bleeding, no easy bruising. Patient has had 2 recent admissions for similar complaints and has had a recent endoscopy and colonoscopy which did not reveal an etiology for his anemia. In the internal medicine clinic he was found to have a hemoglobin of 5.9. His last hemoglobin on discharge from the hospital was 8.4 on 11/17. Patient was admitted for further workup of his anemia. Since admission, he has received 2 units packed red blood cells and his hemoglobin has improved to 8.5. He is symptomatically much improved. No shortness of breath currently. He is able to ambulate now without any difficulty and denies lightheadedness.  PMHx, Fam Hx, and/or Soc Hx : As per resident admit note  Vitals:   12/22/15 0607 12/22/15 1235  BP: 99/80 100/67  Pulse: 86 70  Resp: 18 18  Temp: 98.6 F (37 C)  98.4 F (36.9 C)   Gen: Wake alert oriented 3, NAD CVS: Regular rate and rhythm, normal heart sounds Lungs: Clear to auscultation bilaterally Abd: Soft, non tender, non distended, bowel sounds + Ext: No lower extremity edema  Assessment and Plan: I have seen and evaluated the patient as outlined above. I agree with the formulated Assessment and Plan as detailed in the residents' note, with the following changes:   1. Acute symptomatic anemia secondary to blood loss: - Patient presents with worsening shortness of breath and weakness over the last 2 weeks since being discharged from the hospital similar to his prior episodes of anemia. He was noted to have borderline hypotension in the clinic and was lightheaded on standing. He was found to have a hemoglobin of 5.9 and was admitted for further management. He has had a recent endoscopy and colonoscopy with no clear etiology for his anemia. - Patient is now status post 2 units packed red blood cell transfusion and his hemoglobin is improved to 8.5 - GI consult appreciated. Patient obtaining capsule study today. We'll follow-up results. - We'll continue with PPI for now and hold the aspirin and Plavix - We'll need to discuss with outpatient vascular surgeon as to the need for dual antiplatelet therapy. - Repeat CBC in a.m. - Continue with oral iron tablets - No further workup for now  Aldine Contes, MD 12/1/201712:50 PM

## 2015-12-22 NOTE — Consult Note (Signed)
Subjective:   HPI  The patient is a 55 year old male who was recently discharged from this hospital after being here with symptomatic anemia. He underwent EGD and colonoscopy by Dr. Amedeo Plenty both of which were negative and upon discharge the plan was to do a capsule endoscopy as an outpatient. He presents again to the hospital with complaints of weakness and shortness of breath. His hemoglobin on this admission was 5.9 whereas on recent discharge it was 8.4. The patient denies any GI bleeding to his knowledge.  Review of Systems No chest pain  Past Medical History:  Diagnosis Date  . Anemia 11/28/2015  . Anxiety   . Arthritis    "hands, back" (11/28/2015)  . Barrett's esophagus 07/01/2013   Without dysplasia on biopsy 09/03/2012. Repeat EGD recommended 08/2015  . Carotid artery stenosis 07/01/2013   Requiring right sided stent   . Chronic pain syndrome 07/01/2013  . Closed head injury with brief loss of consciousness ([place]) 07/22/2010   Head trapped in a hydraulic device at work.  Fracture of orbital bones on right and brief loss of consciousness per report.  . 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 There was likely a strong psycho-social overlay in regard to the cognitive deficits in the form of mood disorder with psychotic features and mixed anxiety symptomatology. His primary tested cognitive deficits are in the areas of attention, executi  . Daily headache "since 07/2010"   constantly  . Degenerative joint disease of cervical spine 07/01/2013  . Dupuytren's contracture of both hands 04/08/2014  . Encounter for antineoplastic chemotherapy 10/05/2015  . Erectile dysfunction associated with type 2 diabetes mellitus ([place]) 07/01/2013  . Fibromyalgia 07/01/2013  . History of blood transfusion 11/28/2015   "suppose to get his first today" (11/28/2015)  .  Hyperlipidemia LDL goal < 100 07/01/2013  . Memory changes    "memory issues" from head injury  . Osteoarthritis of right thumb 10/21/2014  . Peripheral vascular occlusive disease ([place]) 07/01/2013   Requiring 2 arterial stents above the left knee per report  . Pneumonia ~ 2006/2007  . Post traumatic stress disorder 07/01/2013  . Primary lung adenocarcinoma ([place]) dx'd 08/2015   "right lung"  . Severe major depression with psychotic features ([place]) 04/15/2011  . Tobacco abuse 07/01/2013  . Tobacco abuse   . Type 2 diabetes mellitus with vascular disease ([place]) 07/01/2013   Left lower extremity and right carotid stenting   Past Surgical History:  Procedure Laterality Date  . CAROTID STENT Right ?2014  . COLONOSCOPY N/A 11/30/2015   Procedure: COLONOSCOPY;  Surgeon: Teena Irani, MD;  Location: [facility] ENDOSCOPY;  Service: Endoscopy;  Laterality: N/A;  . ESOPHAGOGASTRODUODENOSCOPY N/A 11/30/2015   Procedure: ESOPHAGOGASTRODUODENOSCOPY (EGD);  Surgeon: Teena Irani, MD;  Location: [facility] ENDOSCOPY;  Service: Endoscopy;  Laterality: N/A;  . FEMORAL ARTERY STENT Left 05/2012; ~ 2015   Archie Endo 06/04/2012; Raechel Chute report  . FRACTURE SURGERY    . HARDWARE REMOVAL Right 11/15/2011   Removal of deep frontozygomatic orbital hardware/notes 11/15/2011  . HERNIA REPAIR  [id number]   Umbilical  . ORIF ORBITAL FRACTURE Right 08/15/2010    caught in a hydraulic machine; open reduction internal fixation of orbital rim fracture and open reduction of zygomatic arch fracture  Archie Endo 10/13/2009  . VIDEO ASSISTED THORACOSCOPY (VATS)/ LOBECTOMY Right 10/18/2015   Procedure: VIDEO ASSISTED THORACOSCOPY (VATS)/ LOBECTOMY;  Surgeon: Melrose Nakayama, MD;  Location:  [facility] OR;  Service: Thoracic;  Laterality: Right;  Marland Kitchen VIDEO BRONCHOSCOPY Bilateral 09/21/2015   Procedure: VIDEO BRONCHOSCOPY WITH FLUORO;  Surgeon: Juanito Doom, MD;  Location: [facility] ENDOSCOPY;  Service: Cardiopulmonary;  Laterality: Bilateral;   Social History   Social History   . Marital status: Married    Spouse name: N/A  . Number of children: N/A  . Years of education: N/A   Occupational History  . Not on file.   Social History Main Topics  . Smoking status: Former Smoker    Packs/day: 1.00    Years: 35.00    Types: Cigarettes    Quit date: 10/18/2015  . Smokeless tobacco: Never Used     Comment: Quit at time of RUL lobectomy for adenocarcinoma  . Alcohol use 0.0 oz/week     Comment: 11/28/2015 "might drink 1, 6 pack of beer/year"  . Drug use: No  . Sexual activity: Not Currently    Birth control/ protection: None     Comment: Wife had tubes tied   Other Topics Concern  . 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  Currently unemployed.  Finished 9th grade.  Lives with wife, 2 daughters, and mother-in-law in [place].   family history includes Coronary artery disease in his brother and sister; Diabetes in his brother, brother, brother, father, mother, sister, and sister; Healthy in his brother, brother, daughter, daughter, sister, sister, and son; Heart attack in his father; Heart disease in his mother.  Current Facility-Administered Medications:  .  0.9 %  sodium chloride infusion, , Intravenous, Once, Maryellen Pile, MD .  0.9 %  sodium chloride infusion, , Intravenous, Continuous, Maryellen Pile, MD, Last Rate: 100 mL/hr at 12/21/15 1608 .  acetaminophen (TYLENOL) tablet 650 mg, 650 mg, Oral, Q6H PRN **OR** acetaminophen (TYLENOL) suppository 650 mg, 650 mg, Rectal, Q6H PRN, Alphonzo Grieve, MD .  atorvastatin (LIPITOR) tablet 40 mg, 40 mg, Oral, Daily, Maryellen Pile, MD, 40 mg at 12/21/15 1831 .  feeding supplement (ENSURE ENLIVE) (ENSURE ENLIVE) liquid 237 mL, 237 mL, Oral, BID BM, Nischal Narendra, MD .  ferrous sulfate tablet 325 mg, 325 mg, Oral, BID WC, Maryellen Pile, MD .  insulin aspart (novoLOG)  injection 0-9 Units, 0-9 Units, Subcutaneous, TID WC, Maryellen Pile, MD, 2 Units at 12/21/15 1830 .  pantoprazole (PROTONIX) injection 40 mg, 40 mg, Intravenous, Q12H, Maryellen Pile, MD .  prochlorperazine (COMPAZINE) tablet 10 mg, 10 mg, Oral, Q6H PRN, Maryellen Pile, MD .  sertraline (ZOLOFT) tablet 25 mg, 25 mg, Oral, Daily, Maryellen Pile, MD, 25 mg at 12/21/15 1831 .  sodium chloride flush (NS) 0.9 % injection 3 mL, 3 mL, Intravenous, Q12H, Maryellen Pile, MD, 3 mL at 12/21/15 2151 Allergies  Allergen Reactions  . Gabapentin Other (See Comments)    suicidal thoughts  . Celebrex [Celecoxib] Other (See Comments)    Nightmares     Objective:     BP 99/80 (BP Location: Right Arm)   Pulse 86   Temp 98.6 F (37 C) (Oral)   Resp 18   Ht '5\' 7"'$  (1.702 m)   Wt 57.4 kg (126 lb 9.6 oz)   SpO2 100%   BMI 19.83 kg/m   He is in no distress  Nonicteric  Heart regular rhythm no murmurs  Lungs clear  Abdomen: Bowel sounds present, soft, nontender  Laboratory  No components found for: D1    Assessment:     Symptomatic anemia secondary to blood loss etiology unclear. Recent negative EGD and colonoscopy      Plan:     We will set him up for a capsule endoscopy. Lab Results  Component Value Date   HGB 8.5 (L) 12/22/2015   HGB 5.9 (LL) 12/21/2015   HGB 8.4 (L) 12/08/2015   HGB 12.5 (L) 10/05/2015   HCT 26.9 (L) 12/22/2015   HCT 19.9 (L) 12/21/2015   HCT 28.0 (L) 12/08/2015   HCT 37.5 (L) 10/05/2015   ALKPHOS 69 12/21/2015   ALKPHOS 62 12/08/2015   ALKPHOS 79 12/07/2015   ALKPHOS 112 10/05/2015   AST 15 12/21/2015   AST 12 (L) 12/08/2015   AST 16 12/07/2015   AST 9 10/05/2015   ALT 11 (L) 12/21/2015   ALT 9 (L) 12/08/2015   ALT 11 (L) 12/07/2015   ALT 9 10/05/2015

## 2015-12-22 NOTE — Progress Notes (Signed)
Subjective: Spoke with patient and family today. Patient explains he is feeling better today and not having any shortness of breath or chest pain. The family looks forward to finding the etiology of this anemia.   Objective: Vital signs in last 24 hours: Vitals:   12/22/15 0310 12/22/15 0607 12/22/15 0847 12/22/15 1235  BP: (!) 85/59 99/80  100/67  Pulse: 85 86  70  Resp: '18 18  18  '$ Temp: 98.4 F (36.9 C) 98.6 F (37 C)  98.4 F (36.9 C)  TempSrc: Oral Oral  Oral  SpO2: 100% 100%  97%  Weight:   57.4 kg (126 lb 9.6 oz)   Height:   '5\' 7"'$  (1.702 m)    Weight change:   Intake/Output Summary (Last 24 hours) at 12/22/15 1449 Last data filed at 12/22/15 1248  Gross per 24 hour  Intake          2419.67 ml  Output             2125 ml  Net           294.67 ml   Gen: Patient is sitting up in the chair, tripoding, working hard to breathe. More color today HEENT: normocephalic and atraumatic. Pupils are equally round and reactive to light. Moist mucus membranes. Anicteric. No oral lesions present. Tongue is normal size and moist. conjunctiva pallor imporved from yesterday Cv: Regular Rate and rhythm. NL s1 s2 with no heaves rubs or murmurs.  Vascular:. Radial, dorsalis pedis and posterior tibial pulses 2+.  Resp: Normal work of breathing. Clear to auscultation bilaterally. Tympanic percussion throughout.  Abdomen: No abdominal scars present.Non distended. Normal bowel sounds. Not tender to palpitation. Neuro: Oriented x3. Normal visual acuity. EOM intact. Facial motor symmetry. Facial sensation is symmetric. Uvula and tongue are midline. Grossly normal strength throughout. Normal tibial reflexes and appropriate dexterity.  Psych: normal mood and affect.  Ext: Cool extremities. No LE edema. No cyanosis or clubbing. No rashes present. Cap refill<2secs. Digits more red today MSK: No obvious deformed joints.  Lab Results: CBC was notable for [facility] of 8.5/26.9 and platelets of 487. BMP notable  for improved bicarb of 22.  Micro Results: No results found for this or any previous visit (from the past 240 hour(s)). Studies/Results: EGD 11/30/15 - Normal esophagus, stomach and duodenum Colonoscopy 11/30/15 - The entire examined colon is normal on direct and retroflexion views ECHO 12/08/15 Left ventricle: The cavity size was normal. Wall thickness was normal. Systolic function was normal. The estimated ejection fraction was in the range of 55% to 60%. Wall motion was normal; there were no regional wall motion abnormalities. Left ventricular diastolic function parameters were normal for the patient's age. Medications: I have reviewed the patient's current medications. Scheduled Meds: . sodium chloride   Intravenous Once  . atorvastatin  40 mg Oral Daily  . feeding supplement (ENSURE ENLIVE)  237 mL Oral BID BM  . ferrous sulfate  325 mg Oral BID WC  . insulin aspart  0-9 Units Subcutaneous TID WC  . pantoprazole (PROTONIX) IV  40 mg Intravenous Q12H  . sertraline  25 mg Oral Daily  . sodium chloride flush  3 mL Intravenous Q12H   Continuous Infusions: . sodium chloride 100 mL/hr at 12/22/15 0901   PRN Meds:.acetaminophen **OR** acetaminophen, prochlorperazine Assessment/Plan: Active Problems:   GI bleed   Malnutrition of moderate degree  Brett [name], Brett [name] is a 55 yo male with a PMH of T2DM, tobacco abuse, MDD, TBI, and lung  adenocarcinoma (Stage IA T1bN0M0 s/p RULx in 09/2015)who presented from clinic withanemia,shortness of breath, and hypotension.   GI Bleed with Anemia- The patient's recent hospitalization for a GI bleed with anemia without a determined source, and use of ASA and plavix,  most likely points to a recurrent GI Bleed. Given the undetermined source from last admission, the patient most likely is suffering from the same bleed. GI will be consulted to determine a plan to prevent further bleeds. Given the patient is symptomatic, the patient will receive a  blood transfusion. Dc'd ASA and plavix.    - s/p 2 units pRBCs. HH improved from 5.9/19.9 to 8.5/26.9  - capsule endoscopy in process  - Pantoprazole 40 mg q12  - CBC qd  - BMP qd  - ferrous sulfate 325 mcg PO qd  Orthostatic Hypotension - Most lilkely 2/2 to GI bleed with acute anemia. Will reassess as fluid resuscitation and blood transfusions. Improved orthostatics.  - 100 ml/hr NS until after capsule endoscopy  Dyspnea and chest pain - Most likely 2/2 to anemia and lower lung reserve. Sating 100% on Ra.   - No dyspnea on exam today  T2DM, takes Glipizide 20 BID, Metformin 500 BID, Januvia 100 QD - SSI   Stage IAprimary lung adeno, T1bN0M0 s/p lobectomy on 10/18/2015 - f/up with Dr. Earlie Server  Depression - home Zoloft  HLD  - home Lipitor   Peripheral vascular occlusive disease s/p carotid and femoral stent most recently in 2015. Called Dr. Maryjean Morn, vascular surgeon for information on ASA and plavix. Office closed.  - Stop Asa, Plavix  TBI w/ daily headaches - prn Tylenol  FENGI   - NPO  - colace '100mg'$  qd PRN  - strict I/Os  - daily weights  Dispo  - Per GI endoscopy and patient anemia.  This is a Careers information officer Note.  The care of the patient was discussed with Dr. Philipp Ovens and the assessment and plan formulated with their assistance.  Please see their attached note for official documentation of the daily encounter.   LOS: 1 day   Benn Moulder, Medical Student 12/22/2015, 2:49 PM

## 2015-12-22 NOTE — Progress Notes (Signed)
   Subjective: Patient is feeling much better today after 2 unit pRBC transfusion yesterday. He denies chest pain and SOB. He is currently undergoing capsule endoscopy without issue.   Objective:  Vital signs in last 24 hours: Vitals:   12/22/15 0310 12/22/15 0607 12/22/15 0847 12/22/15 1235  BP: (!) 85/59 99/80  100/67  Pulse: 85 86  70  Resp: '18 18  18  '$ Temp: 98.4 F (36.9 C) 98.6 F (37 C)  98.4 F (36.9 C)  TempSrc: Oral Oral  Oral  SpO2: 100% 100%  97%  Weight:   126 lb 9.6 oz (57.4 kg)   Height:   '5\' 7"'$  (1.702 m)    Physical Exam Constitutional: Pale, thin, and elderly appearing gentleman in NAD  HEENT: Conjunctival rim pallor  Cardiovascular: RRR, no murmurs, rubs, or gallops.  Pulmonary/Chest: CTAB, no wheezes, rales, or rhonchi.  Abdominal: Soft, non tender, non distended. +BS. Wearing capsule endoscopy equipment  Extremities: Warm and well perfused. Distal pulses intact. No edema.  Neurological: A&Ox3, CN II - XII grossly intact.  Skin: No rashes or erythema  Psychiatric: Normal mood and affect  Assessment/Plan:  Symptomatic Anemia: S/p 2 units pRBC transfusion yesterday with adequate response in hemoglobin (5.9 -> 8.5). Patient is feeling much better today and denies CP/SOB. This is his third admission for a presumed GI bleed. Labs are consistent with iron deficiency anemia and FOBT was positive on prior admission. Previous work up includes a blood smear remarkable for mixed population hypochromic microcytic RBCs with spherocytes, no schistocytes, and increased platelet count. LDH was normal and direct coombs was negative. He underwent EGD and Colonoscopy on 11/09 during his first admission that were unable to localize the bleed. Patient was planning for outpatient capsule endoscopy with GI however was readmitted yesterday.  -- GI following, appreciate recs -- Capsul endoscopy today  -- Continue IV protonix 40 mg q12 -- Daily CBC -- Transfuse prn to maintain hgb > 7.0   -- Holding ASA and Plavix. Patient has been on dual antiplatelet therapy for carotid and femoral stents placed in 2015. Attempted to reach Dr. Annice Needy however office is closed today. Will have patient follow up as an outpatient.   DM II: On glipizide '20mg'$  BID, metformin '500mg'$  BID and sitagliptin '100mg'$  daily at home. -- SSI  PVD: Patient is s/p carotid and femoral stents in 2015 and was on aspirin '81mg'$  daily and plavix '75mg'$  daily. No clear data on continued anti-platelets for these stents, and in setting of bleeding and acute anemia we will discontinue these.  -- Hold ASA and Plavix   HLD: -- Continue home atorvastatin 40 mg daily   Depression: -- Continue home Zoloft 25 mg daily   FEN: No fluids, replete lytes prn, carb mod diet VTE ppx: SCDs  Code Status: FULL   Dispo: Anticipated discharge pending capsule endoscopy results.   Velna Ochs, MD 12/22/2015, 1:05 PM Pager: [phone]

## 2015-12-22 NOTE — Progress Notes (Signed)
Initial Nutrition Assessment  DOCUMENTATION CODES:   Non-severe (moderate) malnutrition in context of chronic illness  INTERVENTION:    Ensure Enlive po BID, each supplement provides 350 kcal and 20 grams of protein  NUTRITION DIAGNOSIS:   Malnutrition related to chronic illness as evidenced by moderate depletion of body fat, moderate depletions of muscle mass  GOAL:   Patient will meet greater than or equal to 90% of their needs  MONITOR:   PO intake, Supplement acceptance, Labs, Weight trends, Skin, I & O's  REASON FOR ASSESSMENT:   Malnutrition Screening Tool  ASSESSMENT:   55 yo Male with PMH of T2DM, TBI, lung adenocarcinoma stage 1A T1bN0M0 s/p RULx in 09/2015, and symptomatic anemia presenting to the clinic with increasing shortness of breath, weakness and dizziness since recent discharge 2 weeks prior.   Pt reports he's been eating "very little" since his surgery in 09/2015. Has been experiencing some nausea when thinking about food. Sometimes would try and drink an oral nutrition supplement PTA. Has lost approximately 10 lbs since beginning of September. He is amenable to receiving Ensure Enlive.  Nutrition-Focused physical exam findings are moderate fat depletion, moderate muscle depletion, and moderate edema.   Diet Order:  Diet regular Room service appropriate? Yes; Fluid consistency: Thin  Skin:  Reviewed, no issues  Last BM:  11/29  Height:   Ht Readings from Last 1 Encounters:  12/22/15 '5\' 7"'$  (1.702 m)    Weight:   Wt Readings from Last 1 Encounters:  12/22/15 126 lb 9.6 oz (57.4 kg)   Wt Readings from Last 20 Encounters:  12/22/15 126 lb 9.6 oz (57.4 kg)  12/21/15 126 lb 9.6 oz (57.4 kg)  12/07/15 134 lb 0.6 oz (60.8 kg)  12/07/15 130 lb 3.2 oz (59.1 kg)  11/28/15 127 lb 12.8 oz (58 kg)  11/28/15 131 lb 1.6 oz (59.5 kg)  11/24/15 132 lb 4.8 oz (60 kg)  11/07/15 133 lb (60.3 kg)  10/18/15 138 lb 0.1 oz (62.6 kg)  10/17/15 135 lb 7 oz  (61.4 kg)  10/05/15 137 lb 11.2 oz (62.5 kg)  09/29/15 135 lb 9.6 oz (61.5 kg)  09/14/15 136 lb (61.7 kg)  08/25/15 137 lb 6.4 oz (62.3 kg)  04/28/15 140 lb 14.4 oz (63.9 kg)  01/06/15 142 lb 14.4 oz (64.8 kg)  10/21/14 139 lb 4.8 oz (63.2 kg)  07/15/14 136 lb 4.8 oz (61.8 kg)  04/08/14 143 lb (64.9 kg)  01/07/14 141 lb 8 oz (64.2 kg)    Ideal Body Weight:  67.2 kg  BMI:  Body mass index is 19.83 kg/m.  Estimated Nutritional Needs:   Kcal:  1800-2000  Protein:  80-90 gm  Fluid:  1.8-2.0 L  EDUCATION NEEDS:   No education needs identified at this time  Arthur Holms, RD, LDN Pager #: [phone] After-Hours Pager #: [phone]

## 2015-12-23 DIAGNOSIS — R112 Nausea with vomiting, unspecified: Secondary | ICD-10-CM

## 2015-12-23 DIAGNOSIS — R1033 Periumbilical pain: Secondary | ICD-10-CM

## 2015-12-23 LAB — COMPREHENSIVE METABOLIC PANEL
ALT: 11 U/L — ABNORMAL LOW (ref 17–63)
AST: 11 U/L — ABNORMAL LOW (ref 15–41)
Albumin: 3.3 g/dL — ABNORMAL LOW (ref 3.5–5.0)
Alkaline Phosphatase: 75 U/L (ref 38–126)
Anion gap: 9 (ref 5–15)
BUN: 12 mg/dL (ref 6–20)
CO2: 25 mmol/L (ref 22–32)
Calcium: 9.2 mg/dL (ref 8.9–10.3)
Chloride: 99 mmol/L — ABNORMAL LOW (ref 101–111)
Creatinine, Ser: 0.74 mg/dL (ref 0.61–1.24)
GFR calc Af Amer: >60 mL/min (ref >60)
GFR calc non Af Amer: >60 mL/min (ref >60)
Glucose, Bld: 282 mg/dL — ABNORMAL HIGH (ref 65–99)
Potassium: 4 mmol/L (ref 3.5–5.1)
Sodium: 133 mmol/L — ABNORMAL LOW (ref 135–145)
Total Bilirubin: 0.7 mg/dL (ref 0.3–1.2)
Total Protein: 6.2 g/dL — ABNORMAL LOW (ref 6.5–8.1)

## 2015-12-23 LAB — CBC
HCT: 29.3 % — ABNORMAL LOW (ref 39.0–52.0)
Hemoglobin: 9.2 g/dL — ABNORMAL LOW (ref 13.0–17.0)
MCH: 26.4 pg (ref 26.0–34.0)
MCHC: 31.4 g/dL (ref 30.0–36.0)
MCV: 84 fL (ref 78.0–100.0)
Platelets: 516 10*3/uL — ABNORMAL HIGH (ref 150–400)
RBC: 3.49 MIL/uL — ABNORMAL LOW (ref 4.22–5.81)
RDW: 16.6 % — ABNORMAL HIGH (ref 11.5–15.5)
WBC: 16.7 10*3/uL — ABNORMAL HIGH (ref 4.0–10.5)

## 2015-12-23 LAB — GLUCOSE, CAPILLARY
Glucose-Capillary: 147 mg/dL — ABNORMAL HIGH (ref 65–99)
Glucose-Capillary: 148 mg/dL — ABNORMAL HIGH (ref 65–99)
Glucose-Capillary: 202 mg/dL — ABNORMAL HIGH (ref 65–99)
Glucose-Capillary: 229 mg/dL — ABNORMAL HIGH (ref 65–99)
Glucose-Capillary: 249 mg/dL — ABNORMAL HIGH (ref 65–99)
Glucose-Capillary: 266 mg/dL — ABNORMAL HIGH (ref 65–99)
Glucose-Capillary: 293 mg/dL — ABNORMAL HIGH (ref 65–99)

## 2015-12-23 LAB — LIPASE, BLOOD: Lipase: 24 U/L (ref 11–51)

## 2015-12-23 MED ORDER — SODIUM CHLORIDE 0.9 % IV SOLN
INTRAVENOUS | Status: DC
  Administered 2015-12-23 – 2015-12-25 (×5): via INTRAVENOUS

## 2015-12-23 MED ORDER — ONDANSETRON HCL 4 MG/2ML IJ SOLN
4.0000 mg | Freq: Once | INTRAMUSCULAR | Status: AC
  Administered 2015-12-23: 4 mg via INTRAVENOUS
  Filled 2015-12-23: qty 2

## 2015-12-23 MED ORDER — INSULIN ASPART 100 UNIT/ML ~~LOC~~ SOLN
0.0000 [IU] | Freq: Three times a day (TID) | SUBCUTANEOUS | Status: DC
  Administered 2015-12-23 – 2015-12-24 (×4): 2 [IU] via SUBCUTANEOUS
  Administered 2015-12-25: 5 [IU] via SUBCUTANEOUS
  Administered 2015-12-25: 2 [IU] via SUBCUTANEOUS

## 2015-12-23 MED ORDER — ALUM & MAG HYDROXIDE-SIMETH 200-200-20 MG/5ML PO SUSP
30.0000 mL | Freq: Once | ORAL | Status: AC
  Administered 2015-12-23: 30 mL via ORAL

## 2015-12-23 MED ORDER — PROMETHAZINE HCL 25 MG/ML IJ SOLN
INTRAMUSCULAR | Status: AC
  Administered 2015-12-23: 25 mg via INTRAVENOUS
  Filled 2015-12-23: qty 1

## 2015-12-23 MED ORDER — ONDANSETRON HCL 4 MG/2ML IJ SOLN
4.0000 mg | Freq: Once | INTRAMUSCULAR | Status: AC
  Administered 2015-12-23: 4 mg via INTRAVENOUS

## 2015-12-23 MED ORDER — PROMETHAZINE HCL 25 MG/ML IJ SOLN
25.0000 mg | Freq: Once | INTRAMUSCULAR | Status: AC
  Administered 2015-12-23: 25 mg via INTRAVENOUS

## 2015-12-23 MED ORDER — ONDANSETRON HCL 4 MG/2ML IJ SOLN
INTRAMUSCULAR | Status: AC
  Administered 2015-12-23: 4 mg via INTRAVENOUS
  Filled 2015-12-23: qty 2

## 2015-12-23 MED ORDER — MORPHINE SULFATE (PF) 2 MG/ML IV SOLN
1.0000 mg | INTRAVENOUS | Status: DC | PRN
  Administered 2015-12-23: 1 mg via INTRAVENOUS
  Filled 2015-12-23: qty 1

## 2015-12-23 MED ORDER — ALUM & MAG HYDROXIDE-SIMETH 200-200-20 MG/5ML PO SUSP
ORAL | Status: AC
  Administered 2015-12-23: 30 mL via ORAL
  Filled 2015-12-23: qty 30

## 2015-12-23 MED ORDER — ALUM & MAG HYDROXIDE-SIMETH 200-200-20 MG/5ML PO SUSP
ORAL | Status: AC
  Filled 2015-12-23: qty 30

## 2015-12-23 NOTE — Progress Notes (Signed)
[facility] PROGRESS NOTE Subjective patient had small bowel capsule endoscopy yesterday. Results have been downloaded will be read on Monday. He's not had any gross G.I. bleeding. He awoke from sleep with epigastric abdominal pain and green vomitus last night. His temperature was 97.6 and vital signs were okay. He was given some Zofran. The patient notes that his pain is much better this morning but he still feels that something is wrong.  Objective: Vital signs in last 24 hours: Temp:  [97.6 F (36.4 C)-98.6 F (37 C)] 97.6 F (36.4 C) (12/02 0542) Pulse Rate:  [45-89] 84 (12/02 0542) Resp:  [18-20] 20 (12/02 0542) BP: (91-109)/(59-67) 104/59 (12/02 0542) SpO2:  [83 %-100 %] 99 % (12/02 0542) Last BM Date: 12/21/15  Intake/Output from previous day: 12/01 0701 - 12/02 0700 In: 1454.7 [P.O.:360; I.V.:1094.7] Out: 1576 [Urine:725; Emesis/NG output:850; Stool:1] Intake/Output this shift: No intake/output data recorded.  PE: General-- alert him white male talking on the telephone. No distress Heart-- regular rate and rhythm Lungs-- clear Abdomen-- nondistended bowel sounds are present. There is mild tenderness in the epigastric.  Lab Results:  Recent Labs  12/21/15 1129 12/22/15 0401 12/23/15 0148  WBC 8.3 8.0 16.7*  HGB 5.9* 8.5* 9.2*  HCT 19.9* 26.9* 29.3*  PLT 587* 489* 516*   BMET  Recent Labs  12/21/15 1626 12/22/15 0401 12/23/15 0148  NA 134* 137 133*  K 3.8 3.9 4.0  CL 106 107 99*  CO2 18* 22 25  CREATININE 0.88 0.66 0.74   LFT  Recent Labs  12/21/15 1626 12/23/15 0148  PROT 5.4* 6.2*  AST 15 11*  ALT 11* 11*  ALKPHOS 69 75  BILITOT 0.3 0.7   PT/INR No results for input(s): LABPROT, INR in the last 72 hours. PANCREAS  Recent Labs  12/23/15 0148  LIPASE 24         Studies/Results: No results found.  Medications: I have reviewed the patient's current medications.  Assessment/Plan: 1. Abdominal pain/nausea and  vomiting. The patient's physical exam is fairly unremarkable. It appears that his upper abdomen is look dictation of the pain. This concerning that is white count is elevated but he is afebrile. If his pain does not improve he may need a CT scan of his abdomen and chest. Would definitely keep mom IV Protonix for now. 2. Anemia secondary low-grade G.I. bleeding. Has had negative EGD/ colonoscopy within the last month. Small bowel capsule is pending.   Azizi Bally JR,Quantay Zaremba L 12/23/2015, 10:59 AM  This note was created using voice recognition software. Minor errors may Have occurred unintentionally.  Pager: [phone] If no answer or after hours call [phone]

## 2015-12-23 NOTE — Progress Notes (Signed)
Internal Medicine Attending  Date: 12/23/2015  Patient name: Brett [name] Medical record number: [id number] Date of birth: September 17, 1960 Age: 55 y.o. Gender: male  I saw and evaluated the patient. I reviewed the resident's note by Dr. Charlynn Grimes and I agree with the resident's findings and plans as documented in his progress note.  Brett [name] had a difficult night last night. Although he is feeling better at this time he still has occasional abdominal pain that he is unable to further characterize. The capsule endoscopy study was completed and will be read on Monday. I appreciate GI's evaluation and recommendation for a CT of the abdomen should he worsen or the abdominal symptoms not improve. I also appreciate the primary team's research on the issue of Plavix and stenting and at this point we will discontinue the Plavix altogether.  At some point as an outpatient we will restart the aspirin therapy. Hopefully by that time, a source of the bleeding is identified and addressed. Further evaluation and therapy are pending the results of the capsule endoscopy.

## 2015-12-23 NOTE — Progress Notes (Signed)
   Subjective:  Has complaints of abdominal pain this morning. Had episode of nausea and vomiting overnight. Reports green/yellow vomit last night. No further emesis. Nausea improved with medications. Still having some nausea. Abdominal pain improved but still present. Had a bowel movement overnight. Well formed. Denies any melena or hematochezia. No hematemesis. Not eating or drinking much since episode. No CP or SOB.  Objective:  Vital signs in last 24 hours: Vitals:   12/23/15 0042 12/23/15 0117 12/23/15 0343 12/23/15 0542  BP: 91/61 109/63 105/64 (!) 104/59  Pulse: 77 81 89 84  Resp:    20  Temp: 98.6 F (37 C)  98.1 F (36.7 C) 97.6 F (36.4 C)  TempSrc:   Oral Oral  SpO2: 100%  95% 99%  Weight:      Height:       Physical Exam Constitutional: Pale, thin, and elderly appearing gentleman in NAD  HEENT: Conjunctival rim pallor  Cardiovascular: RRR, no murmurs, rubs, or gallops.  Pulmonary/Chest: CTAB, no wheezes, rales, or rhonchi.  Abdominal: Soft, mildly tender in periumbilical area, non distended. Normal bowel sounds.  Extremities: Warm and well perfused. Distal pulses intact. No edema.  Neurological: A&Ox3, CN II - XII grossly intact.  Skin: No rashes or erythema  Psychiatric: Normal mood and affect  Assessment/Plan:  Symptomatic Anemia: S/p 2 units pRBC transfusion 11/30. Hgb stable at 9.2 today. Patient is feeling much better today and denies CP/SOB. Capsule endoscopy complete. Will be read 12/4 per GI.  -- GI following, appreciate recs -- Capsul endoscopy results pending -- Continue IV protonix 40 mg q12 -- Daily CBC -- Transfuse prn to maintain hgb > 7.0  -- Holding ASA and Plavix. Patient has been on dual antiplatelet therapy for carotid and femoral stents placed in 2015.  Abdominal Pain with N/V: Mildly tender to palpation in peri-umbilical region. Exam reassuring. Does have white count of 16 today. Afebrile. Nausea improved with Zofran and Phenergan as needed.  Will continue to monitor. If worsening will consider Abdominal CT.  DM II: CBGs in the 200s.  -- Incase to SSI-moderate  PVD: Patient is s/p carotid and femoral stents in 2015 and was on aspirin '81mg'$  daily and plavix '75mg'$  daily. No clear data on continued anti-platelets for these stents, and in setting of bleeding and acute anemia we will discontinue these.  -- Hold ASA and Plavix   HLD: -- Continue home atorvastatin 40 mg daily    Depression: -- Continue home Zoloft 25 mg daily   FEN: NS 100cc/hr, replete lytes prn, carb mod diet VTE ppx: SCDs  Code Status: FULL   Dispo: Anticipated discharge pending capsule endoscopy results.   Maryellen Pile, MD 12/23/2015, 2:13 PM Pager: [phone]

## 2015-12-23 NOTE — Progress Notes (Signed)
Pt continuing to c/o abdominal pain and nausea. Pt unable to take po medications d/t nausea also. Made IM resident aware. No new orders at this time. Will continue to monitor.

## 2015-12-23 NOTE — Progress Notes (Signed)
Internal medicine resident called to pt's room after he awoke to sudden severe stabbing pain in mid-abdomen. Pt reports never having felt pain like this before. Vital signs stable at this time. Maalox gives no relief. Pt is diaphoretic, nauseated. Pt vomited x1, yellow in appearance, no undigested food. Pt reports having one normal sized formed bowel movement this evening. Pt concerned about small capsule study done today, unsure if capsule has been passed. Will wait for new orders and continue to monitor pt.

## 2015-12-23 NOTE — Progress Notes (Signed)
Subjective: The patient reports abdominal pain the in the middle of the night that awoke home. He said he had some vomit that was green in color. Since then he did not have any vomiting but has also been avoiding food. The patient was encouraged to eat. The patient was told that the capsule endoscopy was completed and the results would be read Monday.   Later int he afternoon, the patient was still having abdominal pain. Per Gi, a CT will be considered.  Objective: Vital signs in last 24 hours: Vitals:   12/23/15 0117 12/23/15 0343 12/23/15 0542 12/23/15 1503  BP: 109/63 105/64 (!) 104/59 (!) 95/56  Pulse: 81 89 84 87  Resp:   20 18  Temp:  98.1 F (36.7 C) 97.6 F (36.4 C) 97.6 F (36.4 C)  TempSrc:  Oral Oral Oral  SpO2:  95% 99% 100%  Weight:      Height:       Weight change: 0 kg (0 lb)  Intake/Output Summary (Last 24 hours) at 12/23/15 1651 Last data filed at 12/23/15 0430  Gross per 24 hour  Intake                3 ml  Output              851 ml  Net             -848 ml   Gen: Patient is lying in bed, resting. not pale.  HEENT: normocephalic and atraumatic. Pupils are equally round and reactive to light. Moist mucus membranes. Anicteric. Cv: Regular Rate and rhythm. NL s1 s2 with no heaves rubs or murmurs.  Vascular:. Radial, dorsalis pedis and posterior tibial pulses 2+.  Resp: Normal work of breathing. Clear to auscultation bilaterally. Tympanic percussion throughout.  Abdomen: No abdominal scars present.Non distended. Normal bowel sounds. Tender to palpation at the umbilicus. Not worse with rebound. NO movement of pain.  Neuro: Oriented x3. Normal visual acuity. EOM intact. Facial motor symmetry. Grossly normal strength throughout. Psych: normal mood and affect.  Ext: Warm extremities. No LE edema. No cyanosis or clubbing. No rashes present.Cap refill<2secs.  MSK: No obvious deformed joints. Lab Results: CBC notable for wbc of 16.7. HH imrpoved to 9.2 and 29.3.  CMP unremarkable. Lipase wnl.  Medications: I have reviewed the patient's current medications. Scheduled Meds: . atorvastatin  40 mg Oral Daily  . feeding supplement (ENSURE ENLIVE)  237 mL Oral BID BM  . ferrous sulfate  325 mg Oral BID WC  . insulin aspart  0-15 Units Subcutaneous TID WC  . pantoprazole (PROTONIX) IV  40 mg Intravenous Q12H  . sertraline  25 mg Oral Daily  . sodium chloride flush  3 mL Intravenous Q12H   Continuous Infusions: . 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    Periumbilical Pain - Pain awaking patient overnight with associated vomiting was concerning for an acute process. Patient also has an elevated WBC that could be related to an inflammatory process. Appendicitis is unlikey given no movement of pain. Will monitor WBC for improvement. SBO is unlikley given +bowel sounds. Acute GI bleed unlikley given improving hemoglobin. Per Gi, will  consider CT abdomen.   - CBC qd  - Consider CT abdomen  - morphine '1mg'$  q4 PRN  GI Bleedwith Anemia- The patient's recent hospitalization for a GI bleed with anemia without a determined source, and use of ASA and plavix, most likely points to a recurrent GI Bleed. Given the undetermined source from last admission, the patient most likely is suffering from the same bleed. GI was consulted. Capsule study completed and will be read Monday. Patient is no longer symptomatic s/p 2 units of pRBCs.  - capsule endoscopy will be read monday - Pantoprazole 40 mg q12 - CBC qd - BMP qd - ferrous sulfate 325 mcg PO qd  Orthostatic Hypotension - Most lilkely 2/2 to GI  bleed with acute anemia. Will reassess as fluid resuscitation and blood transfusions. Improved orthostatics.  - 100 ml/hr NS   Dyspnea and chest pain- Most likely 2/2 to anemia and lower lung reserve. Sating 100% on Ra.   - No dyspnea on exam today  T2DM, takes Glipizide 20 BID, Metformin 500 BID, Januvia 100 QD - SSI   Stage IAprimary lung adeno, T1bN0M0 s/p lobectomy on 10/18/2015 - f/up with Dr. Earlie Server  Depression - home Zoloft  HLD  - home Lipitor   Peripheral vascular occlusive diseases/p carotid and femoral stent most recently in 2015. Called Dr. Maryjean Morn, vascular surgeon for information on ASA and plavix. Office closed.  - Stop Asa, Plavix  TBI w/ daily headaches - prn Tylenol  FENGI - liquid diet - colace '100mg'$  qd PRN  - prochlorperazine '10mg'$  q6 PRN PO - strict I/Os - daily weights  Dispo - Per GI endoscopy read monday  This is a Careers information officer Note.  The care of the patient was discussed with Dr. Charlynn Grimes and the assessment and plan formulated with their assistance.  Please see their attached note for official documentation of the daily encounter.   LOS: 2 days   Benn Moulder, Medical Student 12/23/2015, 4:51 PM

## 2015-12-23 NOTE — Progress Notes (Signed)
Received a call that patient had developed sudden severe abdominal pain which woke him up from sleep. Went up to see the patient -he was hunched over holding his abdomen and had just vomited. He had capsule endoscopy earlier today and had a normal bowel movement this evening, he did not notice the capsule in that bowel movement . He had eaten chicken soup for dinner.  He had stable vital signs except for hypotension which has been on going. On exam he had normal bowel sounds and tenderness over his umbilicus, no CVA tenderness.  While in the room he had green vomitus without blood. Nurse gave a dose of IV Zofran and his pain improved significantly. Suspect that this may be related to gastritis.  Ordered stat CBC, lipase, CMET.

## 2015-12-24 ENCOUNTER — Encounter (HOSPITAL_COMMUNITY): Payer: Self-pay | Admitting: Gastroenterology

## 2015-12-24 LAB — CBC
HCT: 27.8 % — ABNORMAL LOW (ref 39.0–52.0)
Hemoglobin: 8.6 g/dL — ABNORMAL LOW (ref 13.0–17.0)
MCH: 26.1 pg (ref 26.0–34.0)
MCHC: 30.9 g/dL (ref 30.0–36.0)
MCV: 84.5 fL (ref 78.0–100.0)
Platelets: 515 10*3/uL — ABNORMAL HIGH (ref 150–400)
RBC: 3.29 MIL/uL — ABNORMAL LOW (ref 4.22–5.81)
RDW: 17.3 % — ABNORMAL HIGH (ref 11.5–15.5)
WBC: 9 10*3/uL (ref 4.0–10.5)

## 2015-12-24 LAB — GLUCOSE, CAPILLARY
Glucose-Capillary: 142 mg/dL — ABNORMAL HIGH (ref 65–99)
Glucose-Capillary: 126 mg/dL — ABNORMAL HIGH (ref 65–99)

## 2015-12-24 MED ORDER — SODIUM CHLORIDE 0.9 % IV SOLN
8.0000 mg | Freq: Three times a day (TID) | INTRAVENOUS | Status: DC | PRN
  Filled 2015-12-24: qty 4

## 2015-12-24 MED ORDER — SODIUM CHLORIDE 0.9% FLUSH
10.0000 mL | INTRAVENOUS | Status: DC | PRN
Start: 2015-12-24 — End: 2015-12-25

## 2015-12-24 NOTE — Assessment & Plan Note (Signed)
Assessment:  Anemia Patient was admitted twice earlier this month for critically low hemoglobin requiring blood transfusion.  FOBT during admission has been positive.  Patient has had endoscopy and colonoscopy without clear source of bleeding, this month.  Patient has not had a capsule endoscopy and may benefit from further capsule imaging.  Stat CBC was ordered in office and was 5.9.  Plan -Admit to Inpatient Internal Medicine team for blood transfusion  -In office IV NS fluid

## 2015-12-24 NOTE — Progress Notes (Signed)
   Subjective:  Had episode of emesis overnight x2. Reports it was brown. No blood. Last bowel movement was 2 days ago. Denies any bright red blood or dark black stools. Abdominal pain improving but still present. Has been reluctant to eat anything because of his nausea/vomiting and abdominal pain. Denies any CP or SOB.   Objective:  Vital signs in last 24 hours: Vitals:   12/23/15 0542 12/23/15 1503 12/23/15 2211 12/24/15 0631  BP: (!) 104/59 (!) 95/56 (!) 92/59 122/75  Pulse: 84 87 92 (!) 105  Resp: '20 18 17 17  '$ Temp: 97.6 F (36.4 C) 97.6 F (36.4 C) 98.5 F (36.9 C) 98.4 F (36.9 C)  TempSrc: Oral Oral Oral Oral  SpO2: 99% 100% 98% 99%  Weight:      Height:       Physical Exam Constitutional: Pale, thin, and elderly appearing gentleman in NAD  Cardiovascular: RRR, no murmurs, rubs, or gallops.  Pulmonary/Chest: CTAB, no wheezes, rales, or rhonchi.  Abdominal: Soft, mildly tender in periumbilical area, non distended. Normal bowel sounds.  Extremities: Warm and well perfused. Distal pulses intact. No edema.   Assessment/Plan:  Symptomatic Anemia: S/p 2 units pRBC transfusion 11/30. Hgb stable at 9.2 yesterday; CBC pending today. Patient is feeling much better today and denies CP/SOB. Capsule endoscopy complete and will be read 12/4 per GI.  -- GI following, appreciate recs -- Capsul endoscopy results pending -- Continue IV protonix 40 mg q12 -- Daily CBC -- Transfuse prn to maintain hgb > 7.0  -- Holding ASA and Plavix. Patient has been on dual antiplatelet therapy for carotid and femoral stents placed in 2015.  Abdominal Pain with N/V: Mildly tender to palpation in peri-umbilical region. Exam reassuring. Does have white count of 16 yesterday; CBC pending today. Afebrile. Nausea improved with Zofran and Phenergan as needed. Will continue to monitor. If worsening will consider Abdominal CT. Not eating due to abdominal pain and N/V.  -Try soft diet, encourage PO intake  today  DM II: CBGs in the 100.  -- Continue SSI-moderate  PVD: Patient is s/p carotid and femoral stents in 2015 and was on aspirin '81mg'$  daily and plavix '75mg'$  daily. No clear data on continued anti-platelets for these stents, and in setting of bleeding and acute anemia we will discontinue these.  -- Hold ASA and Plavix   HLD: -- Continue home atorvastatin 40 mg daily    Depression: -- Continue home Zoloft 25 mg daily   FEN: NS 100cc/hr, replete lytes prn, carb mod diet VTE ppx: SCDs  Code Status: FULL   Dispo: Anticipated discharge pending capsule endoscopy results.   Maryellen Pile, MD 12/24/2015, 12:19 PM Pager: [phone]

## 2015-12-24 NOTE — Progress Notes (Signed)
Internal Medicine Attending  Date: 12/24/2015  Patient name: Brett [name] Medical record number: [id number] Date of birth: October 23, 1960 Age: 55 y.o. Gender: male  I saw and evaluated the patient. I reviewed the resident's note by Dr. Charlynn Grimes and I agree with the resident's findings and plans as documented in his progress note.  Please see my progress note from 12/23/2015 for recommendations and assessment. In the interim, Brett [name] has been afraid to eat secondary to a fear of a return of his nausea and vomiting. His abdominal exam is improved today with less epigastric tenderness. We decided to de-escalate his diet to clear liquids to see if he would be more willing to try it and assess his ability to keep food and liquids on his stomach. The capsule endoscopy results should be available tomorrow. If negative we will consider the diagnosis of celiac disease given his iron deficiency anemia without a good explanation. Regardless, the Plavix will be discontinued permanently in case he does have AVMs within the small bowel. Sometime in the outpatient clinic, once he is stabilized, we can restart the aspirin therapy given his vasculopathy. I anticipate he will be stable for discharge home if he improves from an appetite and nausea and vomiting standpoint within the next 24-48 hours pending the results of the capsule endoscopy.

## 2015-12-24 NOTE — Progress Notes (Signed)
[facility] PROGRESS NOTE Subjective patient states that he feels much better than his abdominal pain has improved. He had a bowel movement yesterday. No fever. He still feels anorexic.  Objective: Vital signs in last 24 hours: Temp:  [97.6 F (36.4 C)-98.5 F (36.9 C)] 98.4 F (36.9 C) (12/03 0631) Pulse Rate:  [87-105] 105 (12/03 0631) Resp:  [17-18] 17 (12/03 0631) BP: (92-122)/(56-75) 122/75 (12/03 0631) SpO2:  [98 %-100 %] 99 % (12/03 0631) Last BM Date: 12/23/15  Intake/Output from previous day: 12/02 0701 - 12/03 0700 In: 1250 [I.V.:1250] Out: 550 [Urine:300; Emesis/NG output:250] Intake/Output this shift: No intake/output data recorded.  PE: General-- patient talking on the telephone no distress  Lungs-- clear  Abdomen-- soft, nondistended good bowel sounds incompletely nontender  Lab Results:  Recent Labs  12/21/15 1129 12/22/15 0401 12/23/15 0148  WBC 8.3 8.0 16.7*  HGB 5.9* 8.5* 9.2*  HCT 19.9* 26.9* 29.3*  PLT 587* 489* 516*   BMET  Recent Labs  12/21/15 1626 12/22/15 0401 12/23/15 0148  NA 134* 137 133*  K 3.8 3.9 4.0  CL 106 107 99*  CO2 18* 22 25  CREATININE 0.88 0.66 0.74   LFT  Recent Labs  12/21/15 1626 12/23/15 0148  PROT 5.4* 6.2*  AST 15 11*  ALT 11* 11*  ALKPHOS 69 75  BILITOT 0.3 0.7   PT/INR No results for input(s): LABPROT, INR in the last 72 hours. PANCREAS  Recent Labs  12/23/15 0148  LIPASE 24         Studies/Results: No results found.  Medications: I have reviewed the patient's current medications.  Assessment/Plan: 1. He and positive stool. Has had previous EGD colonoscopy, small bowel capsule pending. Hemoglobin 9.2 yesterday up considerably. 2. Abdominal pain. This appears to have resolved. His exam is benign. Could be gastroparesis, reflux, etc. He's tolerating carb modified diet. He is on Protonix could probably change to oral.. If his pain continues would consider ultrasound CT  etc.   Alie Hardgrove JR,Javelle Donigan L 12/24/2015, 10:33 AM  This note was created using voice recognition software. Minor errors may Have occurred unintentionally.  Pager: [phone] If no answer or after hours call [phone]

## 2015-12-25 LAB — GLUCOSE, CAPILLARY
Glucose-Capillary: 140 mg/dL — ABNORMAL HIGH (ref 65–99)
Glucose-Capillary: 144 mg/dL — ABNORMAL HIGH (ref 65–99)
Glucose-Capillary: 214 mg/dL — ABNORMAL HIGH (ref 65–99)

## 2015-12-25 LAB — CBC
HCT: 29.8 % — ABNORMAL LOW (ref 39.0–52.0)
Hemoglobin: 9.1 g/dL — ABNORMAL LOW (ref 13.0–17.0)
MCH: 26.2 pg (ref 26.0–34.0)
MCHC: 30.5 g/dL (ref 30.0–36.0)
MCV: 85.9 fL (ref 78.0–100.0)
Platelets: 530 10*3/uL — ABNORMAL HIGH (ref 150–400)
RBC: 3.47 MIL/uL — ABNORMAL LOW (ref 4.22–5.81)
RDW: 17.6 % — ABNORMAL HIGH (ref 11.5–15.5)
WBC: 6.8 10*3/uL (ref 4.0–10.5)

## 2015-12-25 MED ORDER — SERTRALINE HCL 25 MG PO TABS: 25.0000 mg | ORAL_TABLET | Freq: Every day | ORAL | 1 refills | Status: DC

## 2015-12-25 MED ORDER — GLUCERNA SHAKE PO LIQD: 237.0000 mL | Freq: Two times a day (BID) | ORAL | Status: DC

## 2015-12-25 MED ORDER — PANTOPRAZOLE SODIUM 40 MG PO TBEC
40.0000 mg | DELAYED_RELEASE_TABLET | Freq: Two times a day (BID) | ORAL | Status: DC
  Administered 2015-12-25: 40 mg via ORAL
  Filled 2015-12-25: qty 1

## 2015-12-25 MED ORDER — GLUCERNA PO LIQD: 237.0000 mL | Freq: Two times a day (BID) | ORAL | Status: DC

## 2015-12-25 NOTE — Progress Notes (Addendum)
   Subjective: Patient is feeling better today with only minimal abdominal pain. He was able to eat some dinner last night and breakfast this morning without emesis. No BM since Friday but patients says it is normal for him to go few days without BM. Feels ready to go home.   Objective:  Vital signs in last 24 hours: Vitals:   12/23/15 2211 12/24/15 0631 12/24/15 1425 12/25/15 0540  BP: (!) 92/59 122/75 94/63 (!) 95/56  Pulse: 92 (!) 105 80 80  Resp: '17 17 18 17  '$ Temp: 98.5 F (36.9 C) 98.4 F (36.9 C) 98.8 F (37.1 C) 98.3 F (36.8 C)  TempSrc: Oral Oral Oral Oral  SpO2: 98% 99% 99% 98%  Weight:      Height:       Physical Exam Constitutional: Thin male, NAD, appears comfortable Cardiovascular: RRR, no murmurs, rubs, or gallops.  Pulmonary/Chest: CTAB, no wheezes, rales, or rhonchi.  Abdominal: Soft, minimally tender to palpation in epigastric region, non distended. Hyperactive BS.  Extremities: Warm and well perfused. Distal pulses intact. No edema.  Neurological: A&Ox3, CN II - XII grossly intact.  Skin: No rashes or erythema  Psychiatric: Normal mood and affect  Assessment/Plan:  Symptomatic Anemia: Hgb 5.9 on admission. S/p 2 units pRBC transfusion 11/30. Hgb has since remained stable, 9.1 today. Patient experienced some abdominal pain with N/V over the weekend. He was treated with zofran and phenergan prn. Symptoms have improved today and he is tolerating PO without emesis. Capsule endoscopy complete. Will be read today per GI. If results are negative, may consider checking for celiac disease in the setting of his iron deficiency anemia.  -- GI following, appreciate recs -- Capsul endoscopy results pending -- Continue po protonix 40 mg q12 -- Daily CBC -- Transfuse prn to maintain hgb > 7.0  -- Holding ASA and Plavix. Patient has been on dual antiplatelet therapy for carotid and femoral stents placed in 2015.  DM II: On glipizide '20mg'$  BID, metformin '500mg'$  BID and  sitagliptin '100mg'$  daily at home. CBGs in the 200s over the wekeend. Increased SSI to moderate.  -- SSI-moderate  PVD: Patient is s/p carotid and femoral stents in 2015 and was on aspirin '81mg'$  daily and plavix '75mg'$  daily. No clear data on continued anti-platelets for these stents, and in setting of bleeding and acute anemia we will discontinue these.  -- Hold ASA and Plavix   HLD: -- Continue home atorvastatin 40 mg daily    Depression: -- Continue home Zoloft 25 mg daily   FEN: discontinued fluids, replete lytes prn, carb mod diet VTE ppx: SCDs  Code Status: FULL   Dispo: Anticipated discharge pending results of capsule endoscopy results.   Velna Ochs, MD 12/25/2015, 7:08 AM Pager: [phone]

## 2015-12-25 NOTE — Progress Notes (Signed)
Unclear if capsule will be able to be read today.  If no active bleeding, and patient felt overall ok to be discharged, then patient can be discharged with close outpatient follow up in 1-2 weeks with Dr. Amedeo Plenty.

## 2015-12-25 NOTE — Progress Notes (Signed)
Internal Medicine Attending:   I saw and examined the patient. I reviewed the resident's note and I agree with the resident's findings and plan as documented in the resident's note.  Patient with no new complaints today. He is tolerating oral intake and denies any abdominal pain. No further episodes of nausea or vomiting and no episodes of melena. Hemoglobin remained stable with no evidence of active bleed. We will follow-up results from capsule Endoscopy. If no evidence of bleeding we will consider checking for celiac disease. Continue with PPI for now. Continue to hold aspirin and Plavix. Patient is stable for discharge home today pending results of capsule endoscopy.

## 2015-12-25 NOTE — Progress Notes (Signed)
Internal Medicine Clinic Attending  I saw and evaluated the patient.  I personally confirmed the key portions of the history and exam documented by Dr. Hoffman and I reviewed pertinent patient test results.  The assessment, diagnosis, and plan were formulated together and I agree with the documentation in the resident's note.      

## 2015-12-25 NOTE — Discharge Summary (Signed)
Name: Brett [name] MRN: [id number] DOB: [birth year] 55 y.o. PCP: [name], MD  Date of Admission: 12/21/2015  3:32 PM Date of Discharge: 12/25/2015 Attending Physician: No att. providers found  Discharge Diagnosis: 1. Symptomatic anemia  Discharge Medications:   Medication List    STOP taking these medications   aspirin EC 81 MG tablet   clopidogrel 75 MG tablet Commonly known as:  PLAVIX     TAKE these medications   atorvastatin 40 MG tablet Commonly known as:  LIPITOR Take 1 tablet (40 mg total) by mouth daily.   ferrous sulfate 325 (65 FE) MG tablet Take 1 tablet (325 mg total) by mouth 2 (two) times daily with a meal.   glipiZIDE 10 MG tablet Commonly known as:  GLUCOTROL Take 2 tablets (20 mg total) by mouth 2 (two) times daily before a meal.   lisinopril 5 MG tablet Commonly known as:  PRINIVIL,ZESTRIL Take 0.5 tablets (2.5 mg total) by mouth daily.   metFORMIN 500 MG tablet Commonly known as:  GLUCOPHAGE Take 1 tablet (500 mg total) by mouth 2 (two) times daily with a meal.   pantoprazole 40 MG tablet Commonly known as:  PROTONIX Take 1 tablet (40 mg total) by mouth daily.   prochlorperazine 10 MG tablet Commonly known as:  COMPAZINE Take 1 tablet (10 mg total) by mouth every 6 (six) hours as needed for nausea or vomiting.   sertraline 25 MG tablet Commonly known as:  ZOLOFT Take 1 tablet (25 mg total) by mouth daily.   sitaGLIPtin 100 MG tablet Commonly known as:  JANUVIA Take 1 tablet (100 mg total) by mouth daily.       Disposition and follow-up:   Brett [name] was discharged from [facility] in Stable condition.  At the hospital follow up visit please address:  1.  Symptomatic anemia: Hemoglobin 5.9 on admission, improved to 8.5 with 2 units pRBCs. He remained hemodynamically stable, hgb 9.1 on discharge. This is his third admission for anemia requiring transfusion from a presumed GI bleed. Bleeding unable to be  localized on prior admission with EGD and colonoscopy. GI was consulted and patient underwent capsul endoscopy this admission. Results pending on discharge. Please follow up results and ensure patient has GI follow up scheduled. Consider check for celiac disease if capsule study is negative given his otherwise unexplained iron deficiency anemia.    2.  Labs / imaging needed at time of follow-up: CBC  3.  Pending labs/ test needing follow-up: Capsule Endoscopy   Follow-up Appointments: Follow-up Information    [place]. Go on 12/27/2015.   Why:  at 9:15am for hospital follow up  Contact information: [address] [place], [place]. Call in 2 week(s).   Specialty:  Surgery Why:  Please call your vascular surgeon to schedule a follow up appointment in the next few weeks. Contact information: [address] [place] [address] [phone]        Missy Sabins, MD. Schedule an appointment as soon as possible for a visit.   Specialty:  Gastroenterology Why:  Please call and make an appointment for hospital follow up in the next 1-2 weeks.  Contact information: [address] [place] [address] [place] Hospital Course by problem list:  1. Symptomatic anemia: Patient presented to clinic with increasing  SOB, weakness, an dizziness and found to have hgb of 5.9. This is his third admission for anemia requiring transfusion in the past month from a presumed GI bleed. Labs are consistent with iron deficiency anemia and FOBT was positive on prior admission. He underwent EGD and Colonoscopy on 11/09 during his first admission but bleeding was unable to be localized. He was discharged with plans for outpatient capsule endoscopy but was readmitted a week later again requiring transfusion. This admission, he received 2 units pRBCs with appropriate response (hgb 5.9  -> 8.5) and remained hemodynamically stable for the remainder of his stay (hgb 9.1 on discharge). GI was consulted and patient underwent capsule endoscopy on 12/22/2015 without issue. Read was pending on discharge. He was sent home on oral iron supplementation and protonix 40 mg BID. Please follow up with capsule study results and consider checking for celiac disease if negative given his iron deficiency anemia.   2. PVD: Of note, patient is on dual antiplatelet therapy with ASA and Plavix for carotid and femoral stents placed in August of 2015. Both were held on admission in the setting of his anemia. Patient was instructed discontinue these medications and to follow up with his vascular surgeon.  3. Nausea/Vomiting/Abdominal Pain: Likely viral gastritis. Patient was planning for discharge over the weekend but developed epigastric abdominal pain with associated nausea and vomiting. Exam was reassuring and nausea was treated with zofran and compazine. Symptoms improved the following day and he was tolerating PO without emesis. Labs were significant for a transient leukocytosis of 16.7 that resolved the next day. Patient endorsed some minimal abdominal pain on day of discharge.   Discharge Vitals:   BP 105/64 (BP Location: Right Arm)   Pulse 95   Temp 98 F (36.7 C) (Oral)   Resp 16   Ht '5\' 7"'$  (1.702 m)   Wt 126 lb 9.6 oz (57.4 kg)   SpO2 100%   BMI 19.83 kg/m   Pertinent Labs, Studies, and Procedures:    Ref. Range 12/21/2015 11:29 12/22/2015 04:01 12/23/2015 01:48 12/24/2015 12:48 12/25/2015 08:06  WBC Latest Ref Range: 4.0 - 10.5 K/uL 8.3 8.0 16.7 (H) 9.0 6.8  RBC Latest Ref Range: 4.22 - 5.81 MIL/uL 2.40 (L) 3.16 (L) 3.49 (L) 3.29 (L) 3.47 (L)  Hemoglobin Latest Ref Range: 13.0 - 17.0 g/dL 5.9 (LL) 8.5 (L) 9.2 (L) 8.6 (L) 9.1 (L)  HCT Latest Ref Range: 39.0 - 52.0 % 19.9 (L) 26.9 (L) 29.3 (L) 27.8 (L) 29.8 (L)  MCV Latest Ref Range: 78.0 - 100.0 fL 82.9 85.1 84.0 84.5 85.9  MCH Latest Ref  Range: 26.0 - 34.0 pg 24.6 (L) 26.9 26.4 26.1 26.2  MCHC Latest Ref Range: 30.0 - 36.0 g/dL 29.6 (L) 31.6 31.4 30.9 30.5  RDW Latest Ref Range: 11.5 - 15.5 % 19.3 (H) 16.5 (H) 16.6 (H) 17.3 (H) 17.6 (H)  Platelets Latest Ref Range: 150 - 400 K/uL 587 (H) 489 (H) 516 (H) 515 (H) 530 (H)    Discharge Instructions: Discharge Instructions    Call MD for:  difficulty breathing, headache or visual disturbances    Complete by:  As directed    Call MD for:  extreme fatigue    Complete by:  As directed    Call MD for:  persistant dizziness or light-headedness    Complete by:  As directed    Call MD for:  persistant nausea and vomiting    Complete by:  As directed    Call MD for:  severe uncontrolled pain    Complete by:  As directed    Diet - low sodium heart healthy    Complete by:  As directed    Discharge instructions    Complete by:  As directed    Please stop taking your Aspirin and Plavix. You will need to follow up with your vascular surgeon to discussed future use of these medication. You will also need to call and schedule a follow up appointment with GI. Please call and schedule a follow up appointment with both Dr. Amedeo Plenty (gastroenterologist) and Dr. Maryjean Morn (vascular) in the next 1-2 weeks. You are scheduled to follow up in our Internal Medicine clinic on Wednesday, December 6th to have your blood counts rechecked. If you begin to feel lightheaded, dizzy, or short of breath before then, please call our clinic at [phone] or present to the ED for evaluation. It was a pleasure taking care of you. If you have any questions or concerns, call our clinic at [phone] or after hours call [phone] and ask for the internal medicine resident on call. Thank you!   Increase activity slowly    Complete by:  As directed       Signed: Velna Ochs, MD 12/26/2015, 3:53 PM   Pager: [phone]

## 2015-12-25 NOTE — Care Management Important Message (Signed)
Important Message  Patient Details  Name: Brett [name] MRN: [id number] Date of Birth: 12/27/1960   Medicare Important Message Given:  Yes    [name] 12/25/2015, 11:59 AM

## 2015-12-25 NOTE — Progress Notes (Signed)
Nsg Discharge Note  Admit Date:  12/21/2015 Discharge date: 12/25/2015   Brett [name] to be D/C'd home per MD order.  AVS completed.  Copy for chart, and copy for patient signed, and dated. Patient/caregiver able to verbalize understanding.  Discharge Medication:   Medication List    STOP taking these medications   aspirin EC 81 MG tablet   clopidogrel 75 MG tablet Commonly known as:  PLAVIX     TAKE these medications   atorvastatin 40 MG tablet Commonly known as:  LIPITOR Take 1 tablet (40 mg total) by mouth daily.   ferrous sulfate 325 (65 FE) MG tablet Take 1 tablet (325 mg total) by mouth 2 (two) times daily with a meal.   glipiZIDE 10 MG tablet Commonly known as:  GLUCOTROL Take 2 tablets (20 mg total) by mouth 2 (two) times daily before a meal.   lisinopril 5 MG tablet Commonly known as:  PRINIVIL,ZESTRIL Take 0.5 tablets (2.5 mg total) by mouth daily.   metFORMIN 500 MG tablet Commonly known as:  GLUCOPHAGE Take 1 tablet (500 mg total) by mouth 2 (two) times daily with a meal.   pantoprazole 40 MG tablet Commonly known as:  PROTONIX Take 1 tablet (40 mg total) by mouth daily.   prochlorperazine 10 MG tablet Commonly known as:  COMPAZINE Take 1 tablet (10 mg total) by mouth every 6 (six) hours as needed for nausea or vomiting.   sertraline 25 MG tablet Commonly known as:  ZOLOFT Take 1 tablet (25 mg total) by mouth daily.   sitaGLIPtin 100 MG tablet Commonly known as:  JANUVIA Take 1 tablet (100 mg total) by mouth daily.       Discharge Assessment: Vitals:   12/25/15 0540 12/25/15 1331  BP: (!) 95/56 105/64  Pulse: 80 95  Resp: 17 16  Temp: 98.3 F (36.8 C) 98 F (36.7 C)  Skin clean, dry and intact without evidence of skin break down, no evidence of skin tears noted. IV catheter Power Glide removed by IV team.   D/c Instructions-Education: Discharge instructions given to patient/family with verbalized understanding. D/c education  completed with patient/family including follow up instructions, medication list, d/c activities limitations if indicated, with other d/c instructions as indicated by MD - patient able to verbalize understanding, all questions fully answered. Patient instructed to return to ED, call 911, or call MD for any changes in condition.  Patient escorted via [place], and D/C home via private auto.  Wyonia Hough, RN 12/25/2015 4:10PM

## 2015-12-26 ENCOUNTER — Telehealth: Payer: Self-pay | Admitting: Internal Medicine

## 2015-12-26 DIAGNOSIS — D500 Iron deficiency anemia secondary to blood loss (chronic): Secondary | ICD-10-CM | POA: Diagnosis not present

## 2015-12-26 DIAGNOSIS — K921 Melena: Secondary | ICD-10-CM | POA: Diagnosis not present

## 2015-12-26 NOTE — Telephone Encounter (Signed)
APT. REMINDER CALL, LMTCB °

## 2015-12-27 ENCOUNTER — Encounter: Payer: Self-pay | Admitting: Internal Medicine

## 2015-12-27 ENCOUNTER — Ambulatory Visit (INDEPENDENT_AMBULATORY_CARE_PROVIDER_SITE_OTHER): Payer: Medicare Other | Admitting: Internal Medicine

## 2015-12-27 ENCOUNTER — Encounter (INDEPENDENT_AMBULATORY_CARE_PROVIDER_SITE_OTHER): Payer: Self-pay

## 2015-12-27 VITALS — BP 95/62 | HR 78 | Temp 98.0°F | Ht 67.0 in | Wt 132.1 lb

## 2015-12-27 DIAGNOSIS — Z9889 Other specified postprocedural states: Secondary | ICD-10-CM | POA: Diagnosis not present

## 2015-12-27 DIAGNOSIS — D500 Iron deficiency anemia secondary to blood loss (chronic): Secondary | ICD-10-CM

## 2015-12-27 DIAGNOSIS — E1159 Type 2 diabetes mellitus with other circulatory complications: Secondary | ICD-10-CM

## 2015-12-27 DIAGNOSIS — K922 Gastrointestinal hemorrhage, unspecified: Secondary | ICD-10-CM | POA: Diagnosis not present

## 2015-12-27 LAB — CBC
HCT: 30.2 % — ABNORMAL LOW (ref 39.0–52.0)
Hemoglobin: 9.2 g/dL — ABNORMAL LOW (ref 13.0–17.0)
MCH: 25.9 pg — ABNORMAL LOW (ref 26.0–34.0)
MCHC: 30.5 g/dL (ref 30.0–36.0)
MCV: 85.1 fL (ref 78.0–100.0)
Platelets: 518 10*3/uL — ABNORMAL HIGH (ref 150–400)
RBC: 3.55 MIL/uL — ABNORMAL LOW (ref 4.22–5.81)
RDW: 16.3 % — ABNORMAL HIGH (ref 11.5–15.5)
WBC: 8 10*3/uL (ref 4.0–10.5)

## 2015-12-27 LAB — GLUCOSE, CAPILLARY: Glucose-Capillary: 169 mg/dL — ABNORMAL HIGH (ref 65–99)

## 2015-12-27 MED ORDER — LISINOPRIL 5 MG PO TABS: 2.5000 mg | ORAL_TABLET | Freq: Every day | ORAL | 3 refills | Status: DC

## 2015-12-27 NOTE — Patient Instructions (Addendum)
Please continue to take your medications as prescribed, and watch for symptoms of increasing tiredness, shortness of breath, weakness, changes in your bowel movements, etc.  We have called the GI doctors and the capsule endoscopy report is still in process. They agreed to contact you to schedule a follow up visit for within 1-2 weeks with Dr. Amedeo Plenty.   Please return within 2 weeks for a follow up visit to assess your blood counts. If you develop symptoms sooner you can call to schedule an acute visit earlier.  If you have any questions or concerns, please call the clinic at [phone] or if it is the weekend or after hours, you may call [phone] and ask for the internal medicine resident on call.  Thank you for visiting [place] today, happy holidays!

## 2015-12-27 NOTE — Progress Notes (Signed)
CC: GI bleed hospital follow up  HPI:  [name] is a 55 y.o. male with PMHx detailed below presenting two days after discharge. He was recently hospitalized again for symptomatic anemia from occult GI bleed and required transfusion of 2 units of pRBCs. He underwent a capsule endoscopy but the results are not yet available. He will be scheduled to see GI / Dr. Amedeo Plenty within 1-2 weeks. Since discharge he has been asymptomatic, reports a brief episode of dizziness and shortness of breath when he showered this morning.  See problem based assessment and plan below for additional details.  Past Medical History:  Diagnosis Date  . Anemia 11/28/2015  . Anxiety   . Arthritis    "hands, back" (11/28/2015)  . Barrett's esophagus 07/01/2013   Without dysplasia on biopsy 09/03/2012. Repeat EGD recommended 08/2015  . Carotid artery stenosis 07/01/2013   Requiring right sided stent   . Chronic pain syndrome 07/01/2013  . Closed head injury with brief loss of consciousness ([place]) 07/22/2010   Head trapped in a hydraulic device at work.  Fracture of orbital bones on right and brief loss of consciousness per report.  . 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 There was likely a strong psycho-social overlay in regard to the cognitive deficits in the form of mood disorder with psychotic features and mixed anxiety symptomatology. His primary tested cognitive deficits are in the areas of attention, executi  . Daily headache "since 07/2010"   constantly  . Degenerative joint disease of cervical spine 07/01/2013  . Dupuytren's contracture of both hands 04/08/2014  . Encounter for antineoplastic chemotherapy 10/05/2015  . Erectile dysfunction associated with type 2 diabetes mellitus ([place]) 07/01/2013  . Fibromyalgia 07/01/2013  . History of blood transfusion 11/28/2015   "suppose to get his first today" (11/28/2015)  . Hyperlipidemia LDL goal < 100 07/01/2013  . Memory changes    "memory issues" from head injury  . Osteoarthritis of right thumb 10/21/2014  . Peripheral vascular occlusive disease ([place]) 07/01/2013   Requiring 2 arterial stents above the left knee per report  . Pneumonia ~ 2006/2007  . Post traumatic stress disorder 07/01/2013  . Primary lung adenocarcinoma ([place]) dx'd 08/2015   "right lung"  . Severe major depression with psychotic features ([place]) 04/15/2011  . Tobacco abuse 07/01/2013  . Tobacco abuse   . Type 2 diabetes mellitus with vascular disease ([place]) 07/01/2013   Left lower extremity and right carotid stenting    Review of Systems: Review of Systems  Constitutional: Positive for malaise/fatigue and weight loss. Negative for chills and fever.  Respiratory: Positive for shortness of breath. Negative for cough.   Cardiovascular: Negative for chest pain, palpitations and orthopnea.  Gastrointestinal: Positive for melena (notes dark stool but taking iron supplements). Negative for abdominal pain, blood in stool, constipation, diarrhea, nausea and vomiting.  Genitourinary: Negative for flank pain and hematuria.  Skin: Negative for rash.  Neurological: Positive for dizziness. Negative for tingling, sensory change and focal weakness.  All other systems reviewed and are negative.    Physical Exam: Vitals:   12/27/15 0931  BP: 95/62  Pulse: 78  Temp: 98 F (36.7 C)  TempSrc: Oral  SpO2: 100%  Weight: 132 lb 1.6 oz (59.9 kg)  Height: '5\' 7"'$  (1.702 m)   Body mass index is 20.69 kg/m. GENERAL- Elderly gentleman sitting comfortably in exam room chair,  alert, in no distress, ambulates without difficulty HEENT- Atraumatic, EOMI, moist mucous membranes, upper dentures in place, mucosal pallor present, no visible telangiectasias CARDIAC- Regular rate and rhythm, no murmurs, rubs or gallops. RESP- Clear to ascultation bilaterally, no wheezing  or crackles, normal work of breathing ABDOMEN- Normoactive bowel sounds, soft, nontender, nondistended EXTREMITIES- Normal bulk and range of motion, no edema, 2+ peripheral pulses SKIN- Warm, dry, intact, without visible rash PSYCH- Appropriate affect, clear speech, thoughts linear and goal-directed  Assessment & Plan:   See encounters tab for problem based medical decision making.  Patient seen with Dr. Beryle Beams

## 2015-12-27 NOTE — Assessment & Plan Note (Signed)
Brett [name] returns for hospital follow up visit, recent admission for anemia and received 2 units pRBCs and underwent capsule endoscopy, was discharged on 12/4 with Hb stable at 9. This was his third admission for this issue in the past month. He is FOBT positive, EGD/colonscopy negative for source, iron panel consistent with IDA. Since discharge two days ago he has remained asymptomatic with no significant fatigue, dizziness, dyspnea, or noticeable bleeding. He noticed a brief episode of dizziness while showering those morning but it resolved shortly therapy. He reports last BM was day of discharge and was dark but he report his BMs have been consistently dark due to iron supplementation. He remains somewhat pale on exam but ambulates without difficulty. He is curious to learn the results of the capsule endoscopy but the results are unfortunately not available yet. He was seen by Dr. Paulita Fujita of [facility] while inpatient but was not scheduled for a GI follow up appointment.   Plan: - CBC today - Hb stable at 9.2 [facility], no results yet, agreed to contact [name] to schedule appointment in next 1-2 weeks - Follow up in 1-2 weeks for CBC - Continue Ferrous sulfate 325 mg BID - Continues Protonix '40mg'$  daily

## 2015-12-27 NOTE — Progress Notes (Signed)
Medicine attending: I personally interviewed and briefly examined this patient on the day of the patient visit and reviewed pertinent clinical ,laboratory, data  with resident physician Dr. Asencion Partridge and we discussed a management plan. Patient known to me from recent hospital admissions. He has now had 3 sequential admissions for acute onset of GI bleeding with no documented source of bleeding in the stomach or colon. He just had a capsule endoscopy study a few days ago but the result is not yet available. On my exam, he has no hemangiomas to suggest hereditary hemorrhagic telangiectasia as etiology of his occult GI bleeding. He is rather young to have small bowel AVMs but this is still in the differential. We will call him as soon as results of the capsule endoscopy are available. He is to follow-up with GI within the next 2 weeks and there office was called to make sure this happens. If capsule endoscopy is negative, sensitivity and specificity increases if the study is repeated and an interval. The study is positive, we may have some difficult decisions as to the next best step. Many times it is difficult to localize a focal point of bleeding in the small bowel that would be accessible for surgical resection.

## 2016-01-01 ENCOUNTER — Other Ambulatory Visit: Payer: Self-pay | Admitting: Thoracic Surgery (Cardiothoracic Vascular Surgery)

## 2016-01-01 DIAGNOSIS — C3490 Malignant neoplasm of unspecified part of unspecified bronchus or lung: Secondary | ICD-10-CM

## 2016-01-02 ENCOUNTER — Ambulatory Visit
Admission: RE | Admit: 2016-01-02 | Discharge: 2016-01-02 | Disposition: A | Discharge status: Home / Self Care | Payer: Medicare Other | Source: Ambulatory Visit | Attending: Thoracic Surgery (Cardiothoracic Vascular Surgery) | Admitting: Thoracic Surgery (Cardiothoracic Vascular Surgery)

## 2016-01-02 ENCOUNTER — Ambulatory Visit (INDEPENDENT_AMBULATORY_CARE_PROVIDER_SITE_OTHER): Payer: Self-pay | Admitting: Thoracic Surgery (Cardiothoracic Vascular Surgery)

## 2016-01-02 ENCOUNTER — Encounter: Payer: Self-pay | Admitting: Thoracic Surgery (Cardiothoracic Vascular Surgery)

## 2016-01-02 VITALS — BP 106/76 | HR 95 | Resp 20 | Ht 67.0 in | Wt 132.0 lb

## 2016-01-02 DIAGNOSIS — K625 Hemorrhage of anus and rectum: Secondary | ICD-10-CM | POA: Diagnosis not present

## 2016-01-02 DIAGNOSIS — R0602 Shortness of breath: Secondary | ICD-10-CM | POA: Diagnosis not present

## 2016-01-02 DIAGNOSIS — Z902 Acquired absence of lung [part of]: Secondary | ICD-10-CM

## 2016-01-02 DIAGNOSIS — C3490 Malignant neoplasm of unspecified part of unspecified bronchus or lung: Secondary | ICD-10-CM

## 2016-01-02 DIAGNOSIS — C179 Malignant neoplasm of small intestine, unspecified: Secondary | ICD-10-CM | POA: Diagnosis not present

## 2016-01-02 DIAGNOSIS — C3411 Malignant neoplasm of upper lobe, right bronchus or lung: Secondary | ICD-10-CM

## 2016-01-02 NOTE — Progress Notes (Signed)
[address]       [place] [address]             [phone]    HPI: Mr. Nicholl returns for a scheduled postop follow-up visit.  Mr. Abbey is a 55 year old man with a history of tobacco abuse and COPD who had a thoracoscopic right upper lobectomy for a T1 N0, stage IA non-small cell carcinoma 10/18/2015. He had a small but persistent air leak postoperatively following Monday 13.  He is not having any problems related to his surgery. He is not having significant pain.   He has had 2 admissions since I last saw him in the office for severe anemia requiring transfusion. He's had EGD and colonoscopy which were unremarkable. During his most recent admission he had a capsule endoscopy. They tell me that it showed a possible source in the small intestine and he is scheduled to have another endoscopy on Friday to evaluate that.  He was not able to follow-up with Dr. Julien Nordmann due to being in the hospital.  Past Medical History:  Diagnosis Date  . Anemia 11/28/2015  . Anxiety   . Arthritis    "hands, back" (11/28/2015)  . Barrett's esophagus 07/01/2013   Without dysplasia on biopsy 09/03/2012. Repeat EGD recommended 08/2015  . Carotid artery stenosis 07/01/2013   Requiring right sided stent   . Chronic pain syndrome 07/01/2013  . Closed head injury with brief loss of consciousness ([place]) 07/22/2010   Head trapped in a hydraulic device at work.  Fracture of orbital bones on right and brief loss of consciousness per report.  . 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 There was likely a strong psycho-social overlay in regard to the cognitive deficits in the form of mood disorder with psychotic features and mixed anxiety symptomatology. His primary tested cognitive deficits are in the areas of attention, executi  . Daily headache "since 07/2010"    constantly  . Degenerative joint disease of cervical spine 07/01/2013  . Dupuytren's contracture of both hands 04/08/2014  . Encounter for antineoplastic chemotherapy 10/05/2015  . Erectile dysfunction associated with type 2 diabetes mellitus ([place]) 07/01/2013  . Fibromyalgia 07/01/2013  . History of blood transfusion 11/28/2015   "suppose to get his first today" (11/28/2015)  . Hyperlipidemia LDL goal < 100 07/01/2013  . Memory changes    "memory issues" from head injury  . Osteoarthritis of right thumb 10/21/2014  . Peripheral vascular occlusive disease ([place]) 07/01/2013   Requiring 2 arterial stents above the left knee per report  . Pneumonia ~ 2006/2007  . Post traumatic stress disorder 07/01/2013  . Primary lung adenocarcinoma ([place]) dx'd 08/2015   "right lung"  . Severe major depression with psychotic features ([place]) 04/15/2011  . Tobacco abuse 07/01/2013  . Tobacco abuse   . Type 2 diabetes mellitus with vascular disease ([place]) 07/01/2013   Left lower extremity and right carotid stenting      Current Outpatient Prescriptions  Medication Sig Dispense Refill  . atorvastatin (LIPITOR) 40 MG tablet Take 1 tablet (40 mg total) by mouth daily. 90 tablet 3  . ferrous sulfate 325 (65 FE) MG tablet Take 1 tablet (325 mg total) by mouth 2 (two) times daily with a meal. 60 tablet 3  . glipiZIDE (GLUCOTROL) 10 MG tablet Take 2 tablets (20 mg total) by mouth 2 (two) times daily  before a meal. 360 tablet 3  . lisinopril (PRINIVIL,ZESTRIL) 5 MG tablet Take 0.5 tablets (2.5 mg total) by mouth daily. 90 tablet 3  . metFORMIN (GLUCOPHAGE) 500 MG tablet Take 1 tablet (500 mg total) by mouth 2 (two) times daily with a meal. 180 tablet 3  . pantoprazole (PROTONIX) 40 MG tablet Take 1 tablet (40 mg total) by mouth daily. 90 tablet 3  . prochlorperazine (COMPAZINE) 10 MG tablet Take 1 tablet (10 mg total) by mouth every 6 (six) hours as needed for nausea or vomiting. 30 tablet 0  . sertraline (ZOLOFT) 25 MG  tablet Take 1 tablet (25 mg total) by mouth daily. 90 tablet 1  . sitaGLIPtin (JANUVIA) 100 MG tablet Take 1 tablet (100 mg total) by mouth daily. 90 tablet 3   No current facility-administered medications for this visit.     Physical Exam BP 106/76   Pulse 95   Resp 20   Ht '5\' 7"'$  (1.702 m)   Wt 132 lb (59.9 kg)   SpO2 99% Comment: RA  BMI 20.56 kg/m  55 year old male in no acute distress Alert and oriented 3 Lungs diminished the right base, otherwise clear Incisions well healed  Diagnostic Tests: CHEST  2 VIEW  COMPARISON:  PA and lateral chest x-ray of December 07, 2015  FINDINGS: The lungs are well-expanded. Mild stable volume loss on the right is observed. There is no infiltrate or atelectasis or pleural effusion. No pulmonary nodules are observed. There is no pneumothorax or pneumomediastinum. The heart and pulmonary vascularity are normal. There is calcification in the wall of the aortic arch. The bony thorax exhibits no acute abnormality.  IMPRESSION: There is no active cardiopulmonary disease.  Thoracic aortic atherosclerosis.   Electronically Signed   By: David  Martinique M.D.   On: 01/02/2016 12:10 I personally reviewed the chest x-ray and concur with the findings noted above.  Impression: 55 year old man who is now 3 months out from a thoracoscopic right upper lobectomy for a stage IA non-small cell carcinoma. He's doing well from the standpoint of his chest surgery with no significant pain or primary pulmonary problems.  He has been having difficulty with anemia requiring multiple transfusions. Apparently there is a source in the small intestine and he is going to have a small bowel endoscopy on Friday.  He does need to follow-up with Dr. Julien Nordmann once the GI issues are resolved. In the meantime I will go ahead and schedule him to come back in 3 months with his six-month CT scan so that we don't miss follow-up.  Plan: Reschedule with Dr.  Julien Nordmann Return in 3 months with CT chest  Melrose Nakayama, MD [facility] [phone]

## 2016-01-03 ENCOUNTER — Encounter (HOSPITAL_COMMUNITY): Payer: Self-pay

## 2016-01-04 ENCOUNTER — Other Ambulatory Visit: Payer: Self-pay | Admitting: Gastroenterology

## 2016-01-05 ENCOUNTER — Ambulatory Visit (HOSPITAL_COMMUNITY)
Admission: RE | Admit: 2016-01-05 | Discharge: 2016-01-05 | Disposition: A | Discharge status: Home / Self Care | Payer: Medicare Other | Source: Ambulatory Visit | Attending: Gastroenterology | Admitting: Gastroenterology

## 2016-01-05 ENCOUNTER — Encounter (HOSPITAL_COMMUNITY): Payer: Self-pay

## 2016-01-05 ENCOUNTER — Ambulatory Visit (HOSPITAL_COMMUNITY): Payer: Medicare Other | Admitting: Certified Registered"

## 2016-01-05 ENCOUNTER — Encounter (HOSPITAL_COMMUNITY)
Admission: RE | Disposition: A | Discharge status: Home / Self Care | Payer: Self-pay | Source: Ambulatory Visit | Attending: Gastroenterology

## 2016-01-05 ENCOUNTER — Ambulatory Visit (HOSPITAL_COMMUNITY): Admit: 2016-01-05 | Payer: Medicare Other | Admitting: Gastroenterology

## 2016-01-05 DIAGNOSIS — Z79899 Other long term (current) drug therapy: Secondary | ICD-10-CM | POA: Diagnosis not present

## 2016-01-05 DIAGNOSIS — Z87891 Personal history of nicotine dependence: Secondary | ICD-10-CM | POA: Diagnosis not present

## 2016-01-05 DIAGNOSIS — Z7984 Long term (current) use of oral hypoglycemic drugs: Secondary | ICD-10-CM | POA: Insufficient documentation

## 2016-01-05 DIAGNOSIS — D649 Anemia, unspecified: Secondary | ICD-10-CM | POA: Insufficient documentation

## 2016-01-05 DIAGNOSIS — Z85118 Personal history of other malignant neoplasm of bronchus and lung: Secondary | ICD-10-CM | POA: Insufficient documentation

## 2016-01-05 DIAGNOSIS — M797 Fibromyalgia: Secondary | ICD-10-CM | POA: Diagnosis not present

## 2016-01-05 DIAGNOSIS — C179 Malignant neoplasm of small intestine, unspecified: Secondary | ICD-10-CM | POA: Diagnosis not present

## 2016-01-05 DIAGNOSIS — Z902 Acquired absence of lung [part of]: Secondary | ICD-10-CM | POA: Insufficient documentation

## 2016-01-05 DIAGNOSIS — R933 Abnormal findings on diagnostic imaging of other parts of digestive tract: Secondary | ICD-10-CM | POA: Diagnosis not present

## 2016-01-05 DIAGNOSIS — D490 Neoplasm of unspecified behavior of digestive system: Secondary | ICD-10-CM | POA: Diagnosis not present

## 2016-01-05 DIAGNOSIS — K2270 Barrett's esophagus without dysplasia: Secondary | ICD-10-CM | POA: Diagnosis not present

## 2016-01-05 DIAGNOSIS — F419 Anxiety disorder, unspecified: Secondary | ICD-10-CM | POA: Insufficient documentation

## 2016-01-05 DIAGNOSIS — E1151 Type 2 diabetes mellitus with diabetic peripheral angiopathy without gangrene: Secondary | ICD-10-CM | POA: Diagnosis not present

## 2016-01-05 DIAGNOSIS — E785 Hyperlipidemia, unspecified: Secondary | ICD-10-CM | POA: Insufficient documentation

## 2016-01-05 DIAGNOSIS — Z95820 Peripheral vascular angioplasty status with implants and grafts: Secondary | ICD-10-CM | POA: Diagnosis not present

## 2016-01-05 DIAGNOSIS — C171 Malignant neoplasm of jejunum: Secondary | ICD-10-CM | POA: Insufficient documentation

## 2016-01-05 DIAGNOSIS — K922 Gastrointestinal hemorrhage, unspecified: Secondary | ICD-10-CM | POA: Diagnosis not present

## 2016-01-05 DIAGNOSIS — F329 Major depressive disorder, single episode, unspecified: Secondary | ICD-10-CM | POA: Insufficient documentation

## 2016-01-05 HISTORY — PX: ESOPHAGOGASTRODUODENOSCOPY (EGD) WITH PROPOFOL: SHX5813

## 2016-01-05 LAB — GLUCOSE, CAPILLARY: Glucose-Capillary: 132 mg/dL — ABNORMAL HIGH (ref 65–99)

## 2016-01-05 SURGERY — EGD (ESOPHAGOGASTRODUODENOSCOPY)
Anesthesia: Monitor Anesthesia Care

## 2016-01-05 SURGERY — ESOPHAGOGASTRODUODENOSCOPY (EGD) WITH PROPOFOL
Anesthesia: Monitor Anesthesia Care

## 2016-01-05 MED ORDER — LACTATED RINGERS IV SOLN
INTRAVENOUS | Status: DC | PRN
  Administered 2016-01-05: 14:00:00 via INTRAVENOUS

## 2016-01-05 MED ORDER — PROPOFOL 500 MG/50ML IV EMUL
INTRAVENOUS | Status: DC | PRN
  Administered 2016-01-05: 200 ug/kg/min via INTRAVENOUS

## 2016-01-05 MED ORDER — PROPOFOL 10 MG/ML IV BOLUS
INTRAVENOUS | Status: AC
  Filled 2016-01-05: qty 40

## 2016-01-05 MED ORDER — LIDOCAINE HCL (CARDIAC) 20 MG/ML IV SOLN
INTRAVENOUS | Status: DC | PRN
  Administered 2016-01-05: 75 mg via INTRAVENOUS

## 2016-01-05 MED ORDER — GLYCOPYRROLATE 0.2 MG/ML IJ SOLN
INTRAMUSCULAR | Status: DC | PRN
  Administered 2016-01-05: 0.2 mg via INTRAVENOUS

## 2016-01-05 SURGICAL SUPPLY — 15 items

## 2016-01-05 NOTE — Discharge Instructions (Signed)

## 2016-01-05 NOTE — Anesthesia Preprocedure Evaluation (Signed)
Anesthesia Evaluation  Patient identified by MRN, date of birth, ID band Patient awake    Reviewed: Allergy & Precautions, H&P , NPO status , Patient's Chart, lab work & pertinent test results  Airway Mallampati: I  TM Distance: >3 FB Neck ROM: Full    Dental no notable dental hx. (+) Dental Advisory Given, Poor Dentition, Missing   Pulmonary neg pulmonary ROS, shortness of breath and with exertion, pneumonia, resolved, former smoker,    Pulmonary exam normal breath sounds clear to auscultation       Cardiovascular + Peripheral Vascular Disease  negative cardio ROS   Rhythm:Regular Rate:Normal     Neuro/Psych  Headaches, PSYCHIATRIC DISORDERS Anxiety Depression negative psych ROS   GI/Hepatic Neg liver ROS, Small intestine malignant neoplasm   Endo/Other  diabetes, Well Controlled, Type 2, Oral Hypoglycemic Agents  Renal/GU negative Renal ROS  negative genitourinary   Musculoskeletal  (+) Arthritis , Osteoarthritis,  Fibromyalgia -  Abdominal   Peds  Hematology negative hematology ROS (+) anemia , S/P transfusion in the past for severe anemia   Anesthesia Other Findings   Reproductive/Obstetrics negative OB ROS                               Chemistry      Component Value Date/Time   NA 133 (L) 12/23/2015 0148   NA 140 10/05/2015 1453   K 4.0 12/23/2015 0148   K 3.9 10/05/2015 1453   CL 99 (L) 12/23/2015 0148   CO2 25 12/23/2015 0148   CO2 28 10/05/2015 1453   BUN 12 12/23/2015 0148   BUN 8.8 10/05/2015 1453   CREATININE 0.74 12/23/2015 0148   CREATININE 0.8 10/05/2015 1453      Component Value Date/Time   CALCIUM 9.2 12/23/2015 0148   CALCIUM 9.1 10/05/2015 1453   ALKPHOS 75 12/23/2015 0148   ALKPHOS 112 10/05/2015 1453   AST 11 (L) 12/23/2015 0148   AST 9 10/05/2015 1453   ALT 11 (L) 12/23/2015 0148   ALT 9 10/05/2015 1453   BILITOT 0.7 12/23/2015 0148   BILITOT <0.30  10/05/2015 1453     Lab Results  Component Value Date   WBC 8.0 12/27/2015   HGB 9.2 (L) 12/27/2015   HCT 30.2 (L) 12/27/2015   MCV 85.1 12/27/2015   PLT 518 (H) 12/27/2015    Anesthesia Physical  Anesthesia Plan  ASA: III  Anesthesia Plan: MAC   Post-op Pain Management:    Induction: Intravenous  Airway Management Planned: Nasal Cannula and Natural Airway  Additional Equipment:   Intra-op Plan:   Post-operative Plan:   Informed Consent: I have reviewed the patients History and Physical, chart, labs and discussed the procedure including the risks, benefits and alternatives for the proposed anesthesia with the patient or authorized representative who has indicated his/her understanding and acceptance.   Dental advisory given  Plan Discussed with: CRNA, Anesthesiologist and Surgeon  Anesthesia Plan Comments:         Anesthesia Quick Evaluation

## 2016-01-05 NOTE — Anesthesia Postprocedure Evaluation (Signed)
Anesthesia Post Note  Patient: Brett [name]  Procedure(s) Performed: Procedure(s) (LRB): ESOPHAGOGASTRODUODENOSCOPY (EGD) WITH PROPOFOL (N/A)  Patient location during evaluation: Endoscopy Anesthesia Type: MAC Level of consciousness: awake and alert Pain management: pain level controlled Vital Signs Assessment: post-procedure vital signs reviewed and stable Respiratory status: spontaneous breathing, nonlabored ventilation, respiratory function stable and patient connected to nasal cannula oxygen Cardiovascular status: stable and blood pressure returned to baseline Anesthetic complications: no    Last Vitals:  Vitals:   01/05/16 1537 01/05/16 1600  BP: (!) 78/53 107/67  Pulse:    Resp: 19   Temp:      Last Pain:  Vitals:   01/05/16 1338  TempSrc: Oral  PainSc: 7                  Catalina Gravel

## 2016-01-05 NOTE — Transfer of Care (Signed)
Immediate Anesthesia Transfer of Care Note  Patient: Brett [name]  Procedure(s) Performed: Procedure(s): ESOPHAGOGASTRODUODENOSCOPY (EGD) WITH PROPOFOL (N/A)  Patient Location: PACU  Anesthesia Type:MAC  Level of Consciousness: oriented, patient cooperative and responds to stimulation  Airway & Oxygen Therapy: Patient Spontanous Breathing and Patient connected to nasal cannula oxygen  Post-op Assessment: Report given to RN and Post -op Vital signs reviewed and stable  Post vital signs: stable  Last Vitals:  Vitals:   01/05/16 1338  BP: 90/70  Pulse: 68  Resp: 19  Temp: 36.8 C    Last Pain:  Vitals:   01/05/16 1338  TempSrc: Oral  PainSc: 7          Complications: No apparent anesthesia complications

## 2016-01-05 NOTE — Interval H&P Note (Signed)
History and Physical Interval Note:  01/05/2016 1:39 PM  Brett [name]  has presented today for surgery, with the diagnosis of small intestine malignant neoplasm  The various methods of treatment have been discussed with the patient and family. After consideration of risks, benefits and other options for treatment, the patient has consented to  Procedure(s): ESOPHAGOGASTRODUODENOSCOPY (EGD) WITH PROPOFOL (N/A) as a surgical intervention .  The patient's history has been reviewed, patient examined, no change in status, stable for surgery.  I have reviewed the patient's chart and labs.  Questions were answered to the patient's satisfaction.     [name]

## 2016-01-05 NOTE — Op Note (Signed)
[facility] Patient Name: Brett [name] Procedure Date: 01/05/2016 MRN: [id number] Attending MD: Missy Sabins , MD Date of Birth: [birth year] CSN: [id number] Age: 55 Admit Type: Outpatient Procedure:                Upper GI endoscopy Indications:              Abnormal BRAVO pH capsule results Providers:                Elyse Jarvis. Amedeo Plenty, MD, Cleda Daub, RN, Corliss Parish, Technician Referring MD:              Medicines:                Propofol per Anesthesia Complications:            No immediate complications. Estimated Blood Loss:     Estimated blood loss was minimal. Procedure:                Pre-Anesthesia Assessment:                           - Prior to the procedure, a History and Physical                            was performed, and patient medications and                            allergies were reviewed. The patient's tolerance of                            previous anesthesia was also reviewed. The risks                            and benefits of the procedure and the sedation                            options and risks were discussed with the patient.                            All questions were answered, and informed consent                            was obtained. Prior Anticoagulants: The patient has                            taken no previous anticoagulant or antiplatelet                            agents. ASA Grade Assessment: II - A patient with                            mild systemic disease. After reviewing the risks  and benefits, the patient was deemed in                            satisfactory condition to undergo the procedure.                           After obtaining informed consent, the endoscope was                            passed under direct vision. Throughout the                            procedure, the patient's blood pressure, pulse, and                            oxygen  saturations were monitored continuously. The                            EC-[id number] ([id number]) scope was introduced through                            the mouth, and advanced to the proximal jejunum.                            The upper GI endoscopy was accomplished without                            difficulty. The patient tolerated the procedure                            well. Scope In: Scope Out: Findings:      The examined esophagus was normal.      The entire examined stomach was normal.      A medium-sized infiltrative3 cm length circumferential nonobstructing       mass with no bleeding was found in the jejunum. Biopsies were taken with       a cold forceps for histology.      The examined duodenum was normal. Impression:               - Normal esophagus.                           - Normal stomach.                           - Jejunal mass. Biopsied.                           - Normal examined duodenum. Moderate Sedation:      Moderate (conscious) sedation was. [Parameters Monitored]. [Procedure       Duration Time]. Recommendation:           - Await pathology results.                           - Resume previous diet.                           -  Continue present medications.                           - Refer to a surgeon at appointment to be scheduled. Procedure Code(s):        --- Professional ---                           [phone], Esophagogastroduodenoscopy, flexible,                            transoral; with biopsy, single or multiple Diagnosis Code(s):        --- Professional ---                           K63.89, Other specified diseases of intestine                           R93.3, Abnormal findings on diagnostic imaging of                            other parts of digestive tract CPT copyright 2016 [employer]. All rights reserved. The codes documented in this report are preliminary and upon coder review may  be revised to meet current compliance  requirements. Missy Sabins, MD 01/05/2016 3:34:21 PM This report has been signed electronically. Number of Addenda: 0

## 2016-01-08 ENCOUNTER — Encounter (HOSPITAL_COMMUNITY): Payer: Self-pay | Admitting: Gastroenterology

## 2016-01-08 ENCOUNTER — Encounter: Payer: Self-pay | Admitting: *Deleted

## 2016-01-08 ENCOUNTER — Telehealth: Payer: Self-pay | Admitting: Internal Medicine

## 2016-01-08 DIAGNOSIS — C3491 Malignant neoplasm of unspecified part of right bronchus or lung: Secondary | ICD-10-CM

## 2016-01-08 NOTE — Telephone Encounter (Signed)
sw pt wife to confirm 02/08/16 appt date/time

## 2016-01-08 NOTE — Progress Notes (Signed)
Oncology Nurse Navigator Documentation  Oncology Nurse Navigator Flowsheets 01/08/2016  Navigator Location [facility]  Navigator Encounter Type Other/per Dr. Julien Nordmann order, I place order for follow up CT chest with contrast.  Once scheduled, I will update scheduling to make follow up appt.   Treatment Phase Follow-up  Barriers/Navigation Needs Coordination of Care  Interventions Coordination of Care  Coordination of Care Appts  Acuity Level 1  Acuity Level 1 Minimal follow up required  Time Spent with Patient 30

## 2016-01-10 ENCOUNTER — Other Ambulatory Visit: Payer: Self-pay | Admitting: Gastroenterology

## 2016-01-10 DIAGNOSIS — D490 Neoplasm of unspecified behavior of digestive system: Secondary | ICD-10-CM

## 2016-01-17 ENCOUNTER — Ambulatory Visit (INDEPENDENT_AMBULATORY_CARE_PROVIDER_SITE_OTHER): Payer: Medicare Other | Admitting: Pulmonary Disease

## 2016-01-17 ENCOUNTER — Ambulatory Visit
Admission: RE | Admit: 2016-01-17 | Discharge: 2016-01-17 | Disposition: A | Discharge status: Home / Self Care | Payer: Medicare Other | Source: Ambulatory Visit | Attending: Gastroenterology | Admitting: Gastroenterology

## 2016-01-17 ENCOUNTER — Encounter: Payer: Self-pay | Admitting: Pulmonary Disease

## 2016-01-17 VITALS — BP 124/81 | HR 76 | Temp 97.6°F | Ht 67.0 in | Wt 135.4 lb

## 2016-01-17 DIAGNOSIS — Z902 Acquired absence of lung [part of]: Secondary | ICD-10-CM

## 2016-01-17 DIAGNOSIS — F17211 Nicotine dependence, cigarettes, in remission: Secondary | ICD-10-CM

## 2016-01-17 DIAGNOSIS — D500 Iron deficiency anemia secondary to blood loss (chronic): Secondary | ICD-10-CM | POA: Diagnosis not present

## 2016-01-17 DIAGNOSIS — D490 Neoplasm of unspecified behavior of digestive system: Secondary | ICD-10-CM

## 2016-01-17 DIAGNOSIS — K633 Ulcer of intestine: Secondary | ICD-10-CM | POA: Diagnosis not present

## 2016-01-17 DIAGNOSIS — C171 Malignant neoplasm of jejunum: Secondary | ICD-10-CM | POA: Diagnosis not present

## 2016-01-17 DIAGNOSIS — C3411 Malignant neoplasm of upper lobe, right bronchus or lung: Secondary | ICD-10-CM | POA: Diagnosis not present

## 2016-01-17 DIAGNOSIS — C801 Malignant (primary) neoplasm, unspecified: Secondary | ICD-10-CM

## 2016-01-17 DIAGNOSIS — I6521 Occlusion and stenosis of right carotid artery: Secondary | ICD-10-CM | POA: Diagnosis not present

## 2016-01-17 LAB — CBC WITH DIFFERENTIAL/PLATELET
Basophils Absolute: 0 10*3/uL (ref 0.0–0.1)
Basophils Relative: 1 %
Eosinophils Absolute: 0.2 10*3/uL (ref 0.0–0.7)
Eosinophils Relative: 3 %
HCT: 35.4 % — ABNORMAL LOW (ref 39.0–52.0)
Hemoglobin: 10.7 g/dL — ABNORMAL LOW (ref 13.0–17.0)
Lymphocytes Relative: 19 %
Lymphs Abs: 1.5 10*3/uL (ref 0.7–4.0)
MCH: 24.3 pg — ABNORMAL LOW (ref 26.0–34.0)
MCHC: 30.2 g/dL (ref 30.0–36.0)
MCV: 80.5 fL (ref 78.0–100.0)
Monocytes Absolute: 0.7 10*3/uL (ref 0.1–1.0)
Monocytes Relative: 9 %
Neutro Abs: 5.2 10*3/uL (ref 1.7–7.7)
Neutrophils Relative %: 68 %
Platelets: 473 10*3/uL — ABNORMAL HIGH (ref 150–400)
RBC: 4.4 MIL/uL (ref 4.22–5.81)
RDW: 16.2 % — ABNORMAL HIGH (ref 11.5–15.5)
WBC: 7.6 10*3/uL (ref 4.0–10.5)

## 2016-01-17 MED ORDER — IOPAMIDOL (ISOVUE-300) INJECTION 61%
100.0000 mL | Freq: Once | INTRAVENOUS | Status: AC | PRN
  Administered 2016-01-17: 100 mL via INTRAVENOUS

## 2016-01-17 NOTE — Assessment & Plan Note (Signed)
Assessment: No symptomatic anemia. Hgb has improved to 10.7  Plan: Continue ferrous sulfate

## 2016-01-17 NOTE — Progress Notes (Signed)
   CC: Anemia  HPI:  Mr.Kadin W Vandevoort is a 55 y.o. 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  Questionable ulcerative small lesion in proximal small bowel and 2 other mid small bowel areas with fresh blood. 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  He reports some improvement in his energy. He has occasional mild dyspnea on exertion. He spoke to his GI doctor and has started taking ASA '81mg'$  every other day. Denies change in stool consistency and no hematochezia. No nausea or vomiting. No lightheadedness or dizziness.   Past Medical History:  Diagnosis Date  . Anemia 11/28/2015  . Anxiety   . Arthritis    "hands, back" (11/28/2015)  . Barrett's esophagus 07/01/2013   Without dysplasia on biopsy 09/03/2012. Repeat EGD recommended 08/2015  . Carotid artery stenosis 07/01/2013   Requiring right sided stent   . Chronic pain syndrome 07/01/2013  . Closed head injury with brief loss of consciousness ([place]) 07/22/2010   Head trapped in a hydraulic device at work.  Fracture of orbital bones on right and brief loss of consciousness per report.  . 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 There was likely a strong psycho-social overlay in regard to the cognitive deficits in the form of mood disorder with psychotic features and mixed anxiety symptomatology. His primary tested cognitive deficits are in the areas of attention, executi  . Daily headache "since 07/2010"   constantly  . Degenerative joint disease of cervical spine 07/01/2013  . Dupuytren's contracture of both hands 04/08/2014  . Encounter for antineoplastic chemotherapy 10/05/2015  . Erectile  dysfunction associated with type 2 diabetes mellitus ([place]) 07/01/2013  . Fibromyalgia 07/01/2013  . History of blood transfusion 11/28/2015   "suppose to get his first today" (11/28/2015)  . Hyperlipidemia LDL goal < 100 07/01/2013  . Memory changes    "memory issues" from head injury  . Osteoarthritis of right thumb 10/21/2014  . Peripheral vascular occlusive disease ([place]) 07/01/2013   Requiring 2 arterial stents above the left knee per report  . Pneumonia ~ 2006/2007  . Post traumatic stress disorder 07/01/2013  . Primary lung adenocarcinoma ([place]) dx'd 08/2015   "right lung"  . Severe major depression with psychotic features ([place]) 04/15/2011  . Tobacco abuse 07/01/2013  . Tobacco abuse   . Type 2 diabetes mellitus with vascular disease ([place]) 07/01/2013   Left lower extremity and right carotid stenting    Review of Systems:   No fevers or chills No chest pain  Physical Exam:  Vitals:   01/17/16 1604  BP: 124/81  Pulse: 76  Temp: 97.6 F (36.4 C)  TempSrc: Oral  SpO2: 100%  Weight: 135 lb 6.4 oz (61.4 kg)  Height: '5\' 7"'$  (1.702 m)   General Apperance: NAD HEENT: Normocephalic, atraumatic, anicteric sclera Neck: Supple, trachea midline Lungs: Clear to auscultation bilaterally. No wheezes, rhonchi or rales. Breathing comfortably Heart: Regular rate and rhythm Abdomen: Soft, nontender, nondistended, no rebound/guarding Extremities: Warm and well perfused, no edema Skin: No rashes or lesions Neurologic: Alert and interactive. No gross deficits.   Assessment & Plan:   See Encounters Tab for problem based charting.  Patient discussed with Dr. Daryll Drown

## 2016-01-17 NOTE — Assessment & Plan Note (Signed)
He has restarted ASA '81mg'$  every other day. No new symptoms. Will continue to readd antiplatelets as tolerated.

## 2016-01-17 NOTE — Patient Instructions (Signed)
Please keep your appointment scheduled with Dr. Eppie Gibson. Let us know if your symptoms worsen or you have new unexplained symptoms

## 2016-01-17 NOTE — Assessment & Plan Note (Signed)
Poorly differentiated carcinoma of the jejunum. He underwent CT abd/pelvis per GI today. Encouraged him to keep follow ups with oncology and GI.

## 2016-01-26 NOTE — Progress Notes (Signed)
Internal Medicine Clinic Attending  Case discussed with Dr. Krall soon after the resident saw the patient.  We reviewed the resident's history and exam and pertinent patient test results.  I agree with the assessment, diagnosis, and plan of care documented in the resident's note. 

## 2016-02-07 ENCOUNTER — Telehealth: Payer: Self-pay | Admitting: Internal Medicine

## 2016-02-07 NOTE — Telephone Encounter (Signed)
Pt called to r/s appts due to weather. Gave pt new appt date/time

## 2016-02-08 ENCOUNTER — Ambulatory Visit: Payer: Self-pay | Admitting: Internal Medicine

## 2016-02-12 ENCOUNTER — Other Ambulatory Visit: Payer: Self-pay | Admitting: Internal Medicine

## 2016-02-12 DIAGNOSIS — E1159 Type 2 diabetes mellitus with other circulatory complications: Secondary | ICD-10-CM

## 2016-02-13 ENCOUNTER — Ambulatory Visit (HOSPITAL_BASED_OUTPATIENT_CLINIC_OR_DEPARTMENT_OTHER): Payer: Medicare Other | Admitting: Internal Medicine

## 2016-02-13 ENCOUNTER — Telehealth: Payer: Self-pay | Admitting: Internal Medicine

## 2016-02-13 ENCOUNTER — Encounter: Payer: Self-pay | Admitting: Internal Medicine

## 2016-02-13 ENCOUNTER — Encounter: Payer: Self-pay | Admitting: *Deleted

## 2016-02-13 VITALS — BP 110/79 | HR 95 | Temp 98.7°F | Resp 18 | Ht 67.0 in | Wt 136.1 lb

## 2016-02-13 DIAGNOSIS — C3491 Malignant neoplasm of unspecified part of right bronchus or lung: Secondary | ICD-10-CM

## 2016-02-13 DIAGNOSIS — C171 Malignant neoplasm of jejunum: Secondary | ICD-10-CM | POA: Diagnosis not present

## 2016-02-13 DIAGNOSIS — E119 Type 2 diabetes mellitus without complications: Secondary | ICD-10-CM

## 2016-02-13 DIAGNOSIS — R52 Pain, unspecified: Secondary | ICD-10-CM

## 2016-02-13 DIAGNOSIS — E1159 Type 2 diabetes mellitus with other circulatory complications: Secondary | ICD-10-CM

## 2016-02-13 HISTORY — DX: Malignant neoplasm of jejunum: C17.1

## 2016-02-13 NOTE — Telephone Encounter (Signed)
No LOS per 02/13/16 visit.

## 2016-02-13 NOTE — Telephone Encounter (Signed)
Patients wife just called to let us know he was going to be late due to an accidient on the highway.  She is not sure when they will be here but is on the way.  She was not sure if she needed to reschedule please call back [phone]

## 2016-02-13 NOTE — Progress Notes (Signed)
[place] [phone]   [phone]  OFFICE PROGRESS NOTE  [name], MD [address] [place] [address]  DIAGNOSIS: Stage IV (T1b, N0, M1b) non-small cell lung cancer, adenocarcinoma presented with right upper lobe lung nodule and recent metastasis to the small intestine. This was initially diagnosed in September 2017. Genomic Alterations Identified? ERBB2 amplification - equivocal? CDKN2A p16INK4a E88* and p14ARF T614E SMARCA4 splice site [id number]>PJ SPTA1 E2022* TOP2A amplification TP53 A159P Additional Findings? Microsatellite status MS-Stable Tumor Mutation Burden TMB-Intermediate; 18 Muts/Mb Additional Disease-relevant Genes with No Reportable Alterations Identified? EGFR KRAS ALK BRAF MET RET ROS1   PRIOR THERAPY:  1) Status post right VATS with right upper lobectomy and mediastinal lymph node dissection under the care of Dr. Roxan Hockey on 10/18/2015 and the final pathology was consistent with stage IA (T1b, N0, MX). 2) upper endoscopy on 01/05/2016 showed normal esophagus, normal stomach but there was occasional mass around city CM in length circumferential nonobstructing in the jejunum. The final pathology was consistent with metastatic adenocarcinoma.  CURRENT THERAPY: None.  INTERVAL HISTORY: Brett [name] 56 y.o. male returns to the clinic today for follow-up visit accompanied by his wife. The patient is feeling fine today with no specific complaints except for mild rash in the neck and shoulder area. He denied having any chest pain, shortness of breath, cough or hemoptysis. He has no fever or chills. He denied having any nausea, vomiting, diarrhea or constipation. He underwent right upper lobectomy with lymph node dissection in September 2017 and the final pathology was consistent with a stage IA non-small cell lung cancer. In December 2017 he was complaining of abdominal pain and he underwent lower  endoscopy under the care of Dr. Amedeo Plenty and it showed a jejunal mass and the final biopsy was consistent with adenocarcinoma that was positive for CK 7 but negative for TTF-1. His lung cancer was positive for TTF-1. The patient was referred to general surgery and he has an appointment scheduled for 02/29/2016. He denied having any other significant complaints today. He is here for evaluation and discussion of his condition.  MEDICAL HISTORY: Past Medical History:  Diagnosis Date  . Anemia 11/28/2015  . Anxiety   . Arthritis    "hands, back" (11/28/2015)  . Barrett's esophagus 07/01/2013   Without dysplasia on biopsy 09/03/2012. Repeat EGD recommended 08/2015  . Carotid artery stenosis 07/01/2013   Requiring right sided stent   . Chronic pain syndrome 07/01/2013  . Closed head injury with brief loss of consciousness ([place]) 07/22/2010   Head trapped in a hydraulic device at work.  Fracture of orbital bones on right and brief loss of consciousness per report.  . 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 There was likely a strong psycho-social overlay in regard to the cognitive deficits in the form of mood disorder with psychotic features and mixed anxiety symptomatology. His primary tested cognitive deficits are in the areas of attention, executi  . Daily headache "since 07/2010"   constantly  . Degenerative joint disease of cervical spine 07/01/2013  . Dupuytren's contracture of both hands 04/08/2014  . Encounter for antineoplastic chemotherapy 10/05/2015  . Erectile dysfunction associated with type 2 diabetes mellitus ([place]) 07/01/2013  . Fibromyalgia 07/01/2013  . History of blood transfusion 11/28/2015   "suppose to get his first today" (11/28/2015)  . Hyperlipidemia LDL goal <  100 07/01/2013  . Memory changes    "memory issues" from head injury  . Osteoarthritis  of right thumb 10/21/2014  . Peripheral vascular occlusive disease (H[facility]) 07/01/2013   Requiring 2 arterial stents above the left knee per report  . Pneumonia ~ 2006/2007  . Post traumatic stress disorder 07/01/2013  . Primary lung adenocarcinoma ([facility]) dx'd 08/2015   "right lung"  . 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/15/2011  . Tobacco abuse 07/01/2013  . Tobacco abuse   . Type 2 diabetes mellitus with vascular disease (HCC) 07/01/2013   Left lower extremity and right carotid stenting    ALLERGIES:  is allergic to gabapentin and celebrex [celecoxib].  MEDICATIONS:  Current Outpatient Prescriptions  Medication Sig Dispense Refill  . atorvastatin (LIPITOR) 40 MG tablet Take 1 tablet (40 mg total) by mouth daily. 90 tablet 3  . ferrous sulfate 325 (65 FE) MG tablet Take 1 tablet (325 mg total) by mouth 2 (two) times daily with a meal. 60 tablet 3  . glipiZIDE (GLUCOTROL) 10 MG tablet Take 2 tablets (20 mg total) by mouth 2 (two) times daily before a meal. 360 tablet 3  . HYDROcodone-acetaminophen (NORCO) 10-325 MG tablet Take 1 tablet by mouth every 4 (four) hours as needed for moderate pain or severe pain.    Marland Kitchen lisinopril (PRINIVIL,ZESTRIL) 5 MG tablet Take 0.5 tablets (2.5 mg total) by mouth daily. 90 tablet 3  . metFORMIN (GLUCOPHAGE) 500 MG tablet Take 1 tablet (500 mg total) by mouth 2 (two) times daily with a meal. 180 tablet 3  . pantoprazole (PROTONIX) 40 MG tablet Take 1 tablet (40 mg total) by mouth daily. 90 tablet 3  . prochlorperazine (COMPAZINE) 10 MG tablet Take 1 tablet (10 mg total) by mouth every 6 (six) hours as needed for nausea or vomiting. 30 tablet 0  . sitaGLIPtin (JANUVIA) 100 MG tablet Take 1 tablet (100 mg total) by mouth daily. 90 tablet 3   No current facility-administered medications for this visit.     SURGICAL HISTORY:  Past Surgical History:  Procedure Laterality Date  . CAROTID STENT Right ?2014  . COLONOSCOPY N/A 11/30/2015    Procedure: COLONOSCOPY;  Surgeon: Dorena Cookey, MD;  [facility] ENDOSCOPY;  Service: Endoscopy;  Laterality: N/A;  . ESOPHAGOGASTRODUODENOSCOPY N/A 11/30/2015   Procedure: ESOPHAGOGASTRODUODENOSCOPY (EGD);  Surgeon: Dorena Cookey, MD;  Location[facility] ENDOSCOPY;  Service: Endoscopy;  Laterality: N/A;  . ESOPHAGOGASTRODUODENOSCOPY (EGD) WITH PROPOFOL N/A 01/05/2016   Procedure: ESOPHAGOGASTRODUODENOSCOPY (EGD) WITH PROPOFOL;  Surgeon: Dorena Cookey, MD;  Location: [facility] ENDOSCOPY;  Service: Endoscopy;  Laterality: N/A;  . FEMORAL ARTERY STENT Left 05/2012; ~ 2015   Hattie Perch 06/04/2012; Myrtie Hawk report  . FRACTURE SURGERY    . GIVENS CAPSULE STUDY N/A 12/22/2015   Procedure: GIVENS CAPSULE STUDY;  Surgeon: Graylin Shiver, MD;  Location: [facility] ENDOSCOPY;  Service: Endoscopy;  Laterality: N/A;  . HARDWARE REMOVAL Right 11/15/2011   Removal of deep frontozygomatic orbital hardware/notes 11/15/2011  . HERNIA REPAIR  1996   Umbilical  . ORIF ORBITAL FRACTURE Right 08/15/2010    caught in a hydraulic machine; open reduction internal fixation of orbital rim fracture and open reduction of zygomatic arch fracture  Hattie Perch 10/13/2009  . VIDEO ASSISTED THORACOSCOPY (VATS)/ LOBECTOMY Right 10/18/2015   Procedure: VIDEO ASSISTED THORACOSCOPY (VATS)/ LOBECTOMY;  Surgeon: Loreli Slot, MD;  Location: [facility] OR;  Service: Thoracic;  Laterality: Right;  Marland Kitchen VIDEO BRONCHOSCOPY Bilateral 09/21/2015   Procedure: VIDEO BRONCHOSCOPY WITH FLUORO;  Surgeon:  Lupita Leash, MD;  Location: [facility] ENDOSCOPY;  Service: Cardiopulmonary;  Laterality: Bilateral;    REVIEW OF SYSTEMS:  Constitutional: positive for fatigue Eyes: negative Ears, nose, mouth, throat, and face: negative Respiratory: negative Cardiovascular: negative Gastrointestinal: negative Genitourinary:negative Integument/breast: negative Hematologic/lymphatic: negative Musculoskeletal:negative Neurological: negative Behavioral/Psych: negative Endocrine:  negative Allergic/Immunologic: negative   PHYSICAL EXAMINATION: General appearance: alert, cooperative, fatigued and no distress Head: Normocephalic, without obvious abnormality, atraumatic Neck: no adenopathy, no JVD, supple, symmetrical, trachea midline and thyroid not enlarged, symmetric, no tenderness/mass/nodules Lymph nodes: Cervical, supraclavicular, and axillary nodes normal. Resp: clear to auscultation bilaterally Back: symmetric, no curvature. ROM normal. No CVA tenderness. Cardio: regular rate and rhythm, S1, S2 normal, no murmur, click, rub or gallop GI: soft, non-tender; bowel sounds normal; no masses,  no organomegaly Extremities: extremities normal, atraumatic, no cyanosis or edema Neurologic: Alert and oriented X 3, normal strength and tone. Normal symmetric reflexes. Normal coordination and gait  ECOG PERFORMANCE STATUS: 1 - Symptomatic but completely ambulatory  Blood pressure 110/79, pulse 95, temperature 98.7 F (37.1 C), temperature source Oral, resp. rate 18, height 5\' 7"  (1.702 m), weight 136 lb 1.6 oz (61.7 kg), SpO2 100 %.  LABORATORY DATA: Lab Results  Component Value Date   WBC 7.6 01/17/2016   HGB 10.7 (L) 01/17/2016   HCT 35.4 (L) 01/17/2016   MCV 80.5 01/17/2016   PLT 473 (H) 01/17/2016      Chemistry      Component Value Date/Time   NA 133 (L) 12/23/2015 0148   NA 140 10/05/2015 1453   K 4.0 12/23/2015 0148   K 3.9 10/05/2015 1453   CL 99 (L) 12/23/2015 0148   CO2 25 12/23/2015 0148   CO2 28 10/05/2015 1453   BUN 12 12/23/2015 0148   BUN 8.8 10/05/2015 1453   CREATININE 0.74 12/23/2015 0148   CREATININE 0.8 10/05/2015 1453      Component Value Date/Time   CALCIUM 9.2 12/23/2015 0148   CALCIUM 9.1 10/05/2015 1453   ALKPHOS 75 12/23/2015 0148   ALKPHOS 112 10/05/2015 1453   AST 11 (L) 12/23/2015 0148   AST 9 10/05/2015 1453   ALT 11 (L) 12/23/2015 0148   ALT 9 10/05/2015 1453   BILITOT 0.7 12/23/2015 0148   BILITOT <0.30 10/05/2015  1453       RADIOGRAPHIC STUDIES: Ct Abdomen Pelvis W Contrast  Result Date: 01/17/2016 CLINICAL DATA:  Ulcerative small-bowel lesions on capsule endoscopy. Endoscopic biopsy reveals poorly differentiated carcinoma with immunohistochemical profile suggesting differential diagnosis of poorly differentiated adenocarcinoma the upper GI or lung primary source. EXAM: CT ABDOMEN AND PELVIS WITH CONTRAST TECHNIQUE: Multidetector CT imaging of the abdomen and pelvis was performed using the standard protocol following bolus administration of intravenous contrast. CONTRAST:  01/19/2016 ISOVUE-300 IOPAMIDOL (ISOVUE-300) INJECTION 61% COMPARISON:  PET-CT 10/03/2015 FINDINGS: Lower chest: Imaging through the lung bases is unremarkable.Coronary artery calcification is evident. Hepatobiliary: No focal abnormality within the liver parenchyma. There is no evidence for gallstones, gallbladder wall thickening, or pericholecystic fluid. No intrahepatic or extrahepatic biliary dilation. Pancreas: No focal mass lesion. No dilatation of the main duct. No intraparenchymal cyst. No peripancreatic edema. Ventral moiety of the pancreatic head appears to be relatively spared from fatty change involving the dorsal moiety. Spleen: No splenomegaly. No focal mass lesion. Adrenals/Urinary Tract: No adrenal nodule or mass. Kidneys are unremarkable No evidence for hydroureter. The urinary bladder appears normal for the degree of distention. Stomach/Bowel: Stomach is nondistended. No gastric wall thickening. No evidence of outlet  obstruction. Duodenum is normally positioned as is the ligament of Treitz. Two separate areas of circumferential small bowel wall thickening are identified, 1 is in the left upper quadrant and appears to represent jejunum (see image 44 series 2). This represents an apple-core type lesion measuring about 3.6 cm in length (well seen coronal image 49 series 3). No evidence for obstruction at this lesion. A second very similar  lesion is identified in the right lower quadrant and appears to represent mid small bowel (see image 69 series 2). This lesion measures approximately 3 cm in length and also has an apple-core configuration. No evidence for obstruction at this lesion. The terminal ileum is normal. Appendix is normal. No gross colonic mass. No colonic wall thickening. No substantial diverticular change. Vascular/Lymphatic: There is abdominal aortic atherosclerosis without aneurysm. There is local mesenteric lymphadenopathy adjacent to the left upper quadrant small bowel abnormality with lymph nodes measuring up to about 12 mm short axis (image 44 series 2). Haziness in the underlying mesentery may be related to edema or congestion. Similarly, there is local lymphadenopathy associated with the right lower quadrant small bowel lesion (see image 59 series 2) that indent tracks centrally along the mesentery (well demonstrated on coronal image 44 series 3). Reproductive: Dystrophic calcification noted in the prostate gland. Other: No intraperitoneal free fluid. Musculoskeletal: Bone windows reveal no worrisome lytic or sclerotic osseous lesions. IMPRESSION: 1. There are 2 discrete areas of circumferential small bowel wall thickening identified, 1 in left upper quadrant jejunal loops and a second in the right lower quadrant, likely in the region of the mid small bowel. Both lesions have an apple-core type configuration and local/regional lymphadenopathy highly suspicious for metastatic disease. Reviewing the PET-CT from 3 months ago, there was hypermetabolism in these lesions although it is difficult to discern given background bowel activity. No evidence for obstruction at either lesion on the current exam. Electronically Signed   By: Misty Stanley M.D.   On: 01/17/2016 18:44    ASSESSMENT AND PLAN: This is a very pleasant 56 years old white male with stage IA non-small cell lung cancer, adenocarcinoma status post right upper lobectomy.  The patient also developed 2 discrete areas of small bilateral thickening in the jejunum and biopsy was consistent with adenocarcinoma and immunohistochemical stains is a slightly different from his previous lung cancer. This is concerning for primary small bilateral adenocarcinoma versus metastatic disease from his lung cancer. I had a lengthy discussion with the patient and his wife about his current disease status and treatment options. I strongly recommended for the patient to see general surgery sooner for consideration of surgical resection of the small bowel adenocarcinoma and for further staging workup. I will arrange for the patient to come back for follow-up visit after his surgery for detailed discussion about his treatment options. This patient may benefit from systemic chemotherapy after the surgical resection. For the diabetes mellitus he will continue his current treatment with metformin, Januvia and Glucotrol. For pain management he is currently on no code. I will arrange a follow-up appointment for this patient after his surgical evaluation. He was advised to call immediately if he has any concerning symptoms in the interval. The patient voices understanding of current disease status and treatment options and is in agreement with the current care plan.  All questions were answered. The patient knows to call the clinic with any problems, questions or concerns. We can certainly see the patient much sooner if necessary.  I spent 15 minutes counseling  the patient face to face. The total time spent in the appointment was 25 minutes.  Disclaimer: This note was dictated with voice recognition software. Similar sounding words can inadvertently be transcribed and may not be corrected upon review.

## 2016-02-13 NOTE — Progress Notes (Signed)
Oncology Nurse Navigator Documentation  Oncology Nurse Navigator Flowsheets 02/13/2016  Navigator Location [facility]  Navigator Encounter Type Clinic/MDC/I spoke with patient and wife today at [facility].  He is doing well s/p lung surgery.  Brett [name] has newly DX jejunum masses adenocarcinoma.  Patient is to see general surgery on 02/29/16.  Per Dr. Julien Nordmann, I contacted their office to help expedite appt.   Treatment Phase Follow-up  Barriers/Navigation Needs Coordination of Care  Interventions Coordination of Care  Coordination of Care Appts  Acuity Level 2  Acuity Level 2 Assistance expediting appointments  Time Spent with Patient 30

## 2016-02-21 DIAGNOSIS — C179 Malignant neoplasm of small intestine, unspecified: Secondary | ICD-10-CM | POA: Diagnosis not present

## 2016-02-21 DIAGNOSIS — C3491 Malignant neoplasm of unspecified part of right bronchus or lung: Secondary | ICD-10-CM | POA: Diagnosis not present

## 2016-02-22 ENCOUNTER — Encounter: Payer: Self-pay | Admitting: *Deleted

## 2016-02-22 NOTE — Progress Notes (Signed)
Oncology Nurse Navigator Documentation  Oncology Nurse Navigator Flowsheets 02/22/2016  Navigator Location [facility]  Navigator Encounter Type Other/I obtained Dr. Earlie Server office visit note on [name] [name].  I updated Dr. Julien Nordmann, no new orders received.   Barriers/Navigation Needs Coordination of Care  Interventions Coordination of Care  Acuity Level 2  Time Spent with Patient 30

## 2016-02-26 ENCOUNTER — Ambulatory Visit: Payer: Self-pay | Admitting: Surgery

## 2016-02-26 ENCOUNTER — Encounter (HOSPITAL_COMMUNITY)
Admission: RE | Admit: 2016-02-26 | Discharge: 2016-02-26 | Disposition: A | Discharge status: Home / Self Care | Payer: Medicare Other | Source: Ambulatory Visit | Attending: Surgery | Admitting: Surgery

## 2016-02-26 ENCOUNTER — Other Ambulatory Visit: Payer: Self-pay | Admitting: *Deleted

## 2016-02-26 ENCOUNTER — Encounter (HOSPITAL_COMMUNITY): Payer: Self-pay

## 2016-02-26 ENCOUNTER — Other Ambulatory Visit: Payer: Self-pay

## 2016-02-26 ENCOUNTER — Other Ambulatory Visit (HOSPITAL_COMMUNITY): Payer: Self-pay | Admitting: *Deleted

## 2016-02-26 ENCOUNTER — Other Ambulatory Visit (HOSPITAL_COMMUNITY): Payer: Self-pay | Admitting: Anesthesiology

## 2016-02-26 DIAGNOSIS — Z79899 Other long term (current) drug therapy: Secondary | ICD-10-CM | POA: Diagnosis not present

## 2016-02-26 DIAGNOSIS — G894 Chronic pain syndrome: Secondary | ICD-10-CM | POA: Diagnosis not present

## 2016-02-26 DIAGNOSIS — K2270 Barrett's esophagus without dysplasia: Secondary | ICD-10-CM | POA: Diagnosis not present

## 2016-02-26 DIAGNOSIS — C179 Malignant neoplasm of small intestine, unspecified: Secondary | ICD-10-CM | POA: Diagnosis not present

## 2016-02-26 DIAGNOSIS — Z87891 Personal history of nicotine dependence: Secondary | ICD-10-CM | POA: Diagnosis not present

## 2016-02-26 DIAGNOSIS — D630 Anemia in neoplastic disease: Secondary | ICD-10-CM | POA: Diagnosis not present

## 2016-02-26 DIAGNOSIS — Z85118 Personal history of other malignant neoplasm of bronchus and lung: Secondary | ICD-10-CM | POA: Diagnosis not present

## 2016-02-26 DIAGNOSIS — Z7984 Long term (current) use of oral hypoglycemic drugs: Secondary | ICD-10-CM | POA: Diagnosis not present

## 2016-02-26 DIAGNOSIS — Z87820 Personal history of traumatic brain injury: Secondary | ICD-10-CM | POA: Diagnosis not present

## 2016-02-26 DIAGNOSIS — E785 Hyperlipidemia, unspecified: Secondary | ICD-10-CM | POA: Diagnosis not present

## 2016-02-26 DIAGNOSIS — E1151 Type 2 diabetes mellitus with diabetic peripheral angiopathy without gangrene: Secondary | ICD-10-CM | POA: Diagnosis not present

## 2016-02-26 DIAGNOSIS — Z01812 Encounter for preprocedural laboratory examination: Secondary | ICD-10-CM | POA: Insufficient documentation

## 2016-02-26 DIAGNOSIS — M797 Fibromyalgia: Secondary | ICD-10-CM | POA: Diagnosis not present

## 2016-02-26 LAB — BASIC METABOLIC PANEL
Anion gap: 7 (ref 5–15)
BUN: 15 mg/dL (ref 6–20)
CO2: 27 mmol/L (ref 22–32)
Calcium: 9.1 mg/dL (ref 8.9–10.3)
Chloride: 104 mmol/L (ref 101–111)
Creatinine, Ser: 0.71 mg/dL (ref 0.61–1.24)
GFR calc Af Amer: >60 mL/min (ref >60)
GFR calc non Af Amer: >60 mL/min (ref >60)
Glucose, Bld: 243 mg/dL — ABNORMAL HIGH (ref 65–99)
Potassium: 4.5 mmol/L (ref 3.5–5.1)
Sodium: 138 mmol/L (ref 135–145)

## 2016-02-26 LAB — CBC
HCT: 33.2 % — ABNORMAL LOW (ref 39.0–52.0)
Hemoglobin: 10 g/dL — ABNORMAL LOW (ref 13.0–17.0)
MCH: 23 pg — ABNORMAL LOW (ref 26.0–34.0)
MCHC: 30.1 g/dL (ref 30.0–36.0)
MCV: 76.5 fL — ABNORMAL LOW (ref 78.0–100.0)
Platelets: 390 10*3/uL (ref 150–400)
RBC: 4.34 MIL/uL (ref 4.22–5.81)
RDW: 16.5 % — ABNORMAL HIGH (ref 11.5–15.5)
WBC: 9.9 10*3/uL (ref 4.0–10.5)

## 2016-02-26 LAB — ABO/RH: ABO/RH(D): A POS

## 2016-02-26 LAB — GLUCOSE, CAPILLARY: Glucose-Capillary: 262 mg/dL — ABNORMAL HIGH (ref 65–99)

## 2016-02-26 MED ORDER — HYDROCODONE-ACETAMINOPHEN 10-325 MG PO TABS: 1.0000 | ORAL_TABLET | Freq: Three times a day (TID) | ORAL | 0 refills | Status: DC | PRN

## 2016-02-26 NOTE — Telephone Encounter (Signed)
Note reopened, information already sent

## 2016-02-26 NOTE — Telephone Encounter (Signed)
Pts wife informed.

## 2016-02-26 NOTE — Progress Notes (Signed)
Patient informed nurse that he has a red rash on upper extremity. Patient does have red rash with reddened skin on bilateral upper arms and around neck. Patient states wife changed shampoo on him. Instructed patient to go to [facility] today to have it looked at and let Dr. Cindie Laroche be aware.

## 2016-02-26 NOTE — Telephone Encounter (Signed)
HYDROcodone-acetaminophen (NORCO) 10-325 MG tablet, Refill request. 

## 2016-02-26 NOTE — Patient Instructions (Addendum)
Brydon Spahr Mccort  02/26/2016   Your procedure is scheduled on: Tuesday 02/27/2016  Report to [facility]  Entrance take [place]  elevators to 3rd floor to  [place] at  [place]  AM.  Call this number if you have problems the morning of surgery [phone]   Remember: ONLY 1 PERSON MAY GO WITH YOU TO SHORT STAY TO GET  READY MORNING OF [place].              FOLLOW BOWEL PREP INSTRUCTIONS FROM DR. MARTIN'S OFFICE TODAY ALONG WITH A CLEAR LIQUID DIET ALL DAY UP UNTIL MIDNIGHT!    CLEAR LIQUID DIET   Foods Allowed                                                                     Foods Excluded  Coffee and tea, regular and decaf                             liquids that you cannot  Plain Jell-O in any flavor                                             see through such as: Fruit ices (not with fruit pulp)                                     milk, soups, orange juice  Iced Popsicles                                    All solid food Carbonated beverages, regular and diet                                    Cranberry, grape and apple juices Sports drinks like Gatorade Lightly seasoned clear broth or consume(fat free) Sugar, honey syrup  Sample Menu Breakfast                                Lunch                                     Supper Cranberry juice                    Beef broth                            Chicken broth Jell-O  Grape juice                           Apple juice Coffee or tea                        Jell-O                                      Popsicle                                                Coffee or tea                        Coffee or tea  _____________________________________________________________________    Do not eat food or drink liquids :After Midnight.  How to Manage Your Diabetes Before and After Surgery  Why is it important to control my blood sugar before and after  surgery? . Improving blood sugar levels before and after surgery helps healing and can limit problems. . A way of improving blood sugar control is eating a healthy diet by: o  Eating less sugar and carbohydrates o  Increasing activity/exercise o  Talking with your doctor about reaching your blood sugar goals . High blood sugars (greater than 180 mg/dL) can raise your risk of infections and slow your recovery, so you will need to focus on controlling your diabetes during the weeks before surgery. . Make sure that the doctor who takes care of your diabetes knows about your planned surgery including the date and location.  How do I manage my blood sugar before surgery? . Check your blood sugar at least 4 times a day, starting 2 days before surgery, to make sure that the level is not too high or low. o Check your blood sugar the morning of your surgery when you wake up and every 2 hours until you get to the Short Stay unit. . If your blood sugar is less than 70 mg/dL, you will need to treat for low blood sugar: o Do not take insulin. o Treat a low blood sugar (less than 70 mg/dL) with  cup of clear juice (cranberry or apple), 4 glucose tablets, OR glucose gel. o Recheck blood sugar in 15 minutes after treatment (to make sure it is greater than 70 mg/dL). If your blood sugar is not greater than 70 mg/dL on recheck, call [phone] for further instructions. . Report your blood sugar to the short stay nurse when you get to Short Stay.  . If you are admitted to the hospital after surgery: o Your blood sugar will be checked by the staff and you will probably be given insulin after surgery (instead of oral diabetes medicines) to make sure you have good blood sugar levels. o The goal for blood sugar control after surgery is 80-180 mg/dL.   WHAT DO I DO ABOUT MY DIABETES MEDICATION?  TAKE ONLY AM AND LUNCH DOSE OF GLIPIZIDE MEDICATION TODAY! TAKE METFORMIN AS USUAL TODAY! TAKE JANUVIA  AS USUAL  TODAY!  Marland Kitchen Do not take oral diabetes medicines (pills) the morning of surgery.! .    Take these medicines the morning of surgery with A SIP OF WATER: PANTOPRAZOLE (  PROTONIX)                                  You may not have any metal on your body including hair pins and              piercings  Do not wear jewelry, make-up, lotions, powders or perfumes, deodorant             Do not wear nail polish.  Do not shave  48 hours prior to surgery.              Men may shave face and neck.   Do not bring valuables to the hospital. [place].  Contacts, dentures or bridgework may not be worn into surgery.  Leave suitcase in the car. After surgery it may be brought to your room.                  Please read over the following fact sheets you were given: _____________________________________________________________________             [facility] - Preparing for Surgery Before surgery, you can play an important role.  Because skin is not sterile, your skin needs to be as free of germs as possible.  You can reduce the number of germs on your skin by washing with CHG (chlorahexidine gluconate) soap before surgery.  CHG is an antiseptic cleaner which kills germs and bonds with the skin to continue killing germs even after washing. Please DO NOT use if you have an allergy to CHG or antibacterial soaps.  If your skin becomes reddened/irritated stop using the CHG and inform your nurse when you arrive at Short Stay. Do not shave (including legs and underarms) for at least 48 hours prior to the first CHG shower.  You may shave your face/neck. Please follow these instructions carefully:  1.  Shower with CHG Soap the night before surgery and the  morning of Surgery.  2.  If you choose to wash your hair, wash your hair first as usual with your  normal  shampoo.  3.  After you shampoo, rinse your hair and body thoroughly to remove the  shampoo.                            4.  Use CHG as you would any other liquid soap.  You can apply chg directly  to the skin and wash                       Gently with a scrungie or clean washcloth.  5.  Apply the CHG Soap to your body ONLY FROM THE NECK DOWN.   Do not use on face/ open                           Wound or open sores. Avoid contact with eyes, ears mouth and genitals (private parts).                       Wash face,  Genitals (private parts) with your normal soap.             6.  Wash thoroughly, paying special attention to the  area where your surgery  will be performed.  7.  Thoroughly rinse your body with warm water from the neck down.  8.  DO NOT shower/wash with your normal soap after using and rinsing off  the CHG Soap.                9.  Pat yourself dry with a clean towel.            10.  Wear clean pajamas.            11.  Place clean sheets on your bed the night of your first shower and do not  sleep with pets. Day of Surgery : Do not apply any lotions/deodorants the morning of surgery.  Please wear clean clothes to the hospital/surgery center.  FAILURE TO FOLLOW THESE INSTRUCTIONS MAY RESULT IN THE CANCELLATION OF YOUR SURGERY PATIENT SIGNATURE_________________________________  NURSE SIGNATURE__________________________________  ________________________________________________________________________   Adam Phenix  An incentive spirometer is a tool that can help keep your lungs clear and active. This tool measures how well you are filling your lungs with each breath. Taking long deep breaths may help reverse or decrease the chance of developing breathing (pulmonary) problems (especially infection) following:  A long period of time when you are unable to move or be active. BEFORE THE PROCEDURE   If the spirometer includes an indicator to show your best effort, your nurse or respiratory therapist will set it to a desired goal.  If possible, sit up straight or lean slightly  forward. Try not to slouch.  Hold the incentive spirometer in an upright position. INSTRUCTIONS FOR USE  1. Sit on the edge of your bed if possible, or sit up as far as you can in bed or on a chair. 2. Hold the incentive spirometer in an upright position. 3. Breathe out normally. 4. Place the mouthpiece in your mouth and seal your lips tightly around it. 5. Breathe in slowly and as deeply as possible, raising the piston or the ball toward the top of the column. 6. Hold your breath for 3-5 seconds or for as long as possible. Allow the piston or ball to fall to the bottom of the column. 7. Remove the mouthpiece from your mouth and breathe out normally. 8. Rest for a few seconds and repeat Steps 1 through 7 at least 10 times every 1-2 hours when you are awake. Take your time and take a few normal breaths between deep breaths. 9. The spirometer may include an indicator to show your best effort. Use the indicator as a goal to work toward during each repetition. 10. After each set of 10 deep breaths, practice coughing to be sure your lungs are clear. If you have an incision (the cut made at the time of surgery), support your incision when coughing by placing a pillow or rolled up towels firmly against it. Once you are able to get out of bed, walk around indoors and cough well. You may stop using the incentive spirometer when instructed by your caregiver.  RISKS AND COMPLICATIONS  Take your time so you do not get dizzy or light-headed.  If you are in pain, you may need to take or ask for pain medication before doing incentive spirometry. It is harder to take a deep breath if you are having pain. AFTER USE  Rest and breathe slowly and easily.  It can be helpful to keep track of a log of your progress. Your caregiver can provide you with a simple  table to help with this. If you are using the spirometer at home, follow these instructions: [place] IF:   You are having difficultly using the  spirometer.  You have trouble using the spirometer as often as instructed.  Your pain medication is not giving enough relief while using the spirometer.  You develop fever of 100.5 F (38.1 C) or higher. SEEK IMMEDIATE MEDICAL CARE IF:   You cough up bloody sputum that had not been present before.  You develop fever of 102 F (38.9 C) or greater.  You develop worsening pain at or near the incision site. MAKE SURE YOU:   Understand these instructions.  Will watch your condition.  Will get help right away if you are not doing well or get worse. Document Released: 05/20/2006 Document Revised: 04/01/2011 Document Reviewed: 07/21/2006 [facility] Patient Information 2014 [place], [place].   ________________________________________________________________________

## 2016-02-26 NOTE — Progress Notes (Signed)
Asked patient if he was given a bowel prep at Dr. Earlie Server office , he responded NO. Instructed wife to call Dr. Earlie Server office to see if he had a bowel prep to do and wife called while here at [place] appointment. Per office , patient needs to be on a bowel prep with antibiotics! Office faxed over instructions and I went over then with patient and wife for patient to start as soon as he gets home.

## 2016-02-26 NOTE — Progress Notes (Signed)
12/07/2015-noted in EPIC- EKG 12/08/2015-noted in EPIC- ECHO 01/02/2016- noted in EPIC- CXR (s/p right upper lobectomy 10/18/2015)

## 2016-02-27 ENCOUNTER — Inpatient Hospital Stay (HOSPITAL_COMMUNITY): Payer: Medicare Other | Admitting: Certified Registered Nurse Anesthetist

## 2016-02-27 ENCOUNTER — Inpatient Hospital Stay (HOSPITAL_COMMUNITY)
Admission: RE | Admit: 2016-02-27 | Discharge: 2016-03-02 | DRG: 330 | Disposition: A | Discharge status: Home / Self Care | Payer: Medicare Other | Source: Ambulatory Visit | Attending: Surgery | Admitting: Surgery

## 2016-02-27 ENCOUNTER — Encounter (HOSPITAL_COMMUNITY)
Admission: RE | Disposition: A | Discharge status: Home / Self Care | Payer: Self-pay | Source: Ambulatory Visit | Attending: Surgery

## 2016-02-27 ENCOUNTER — Encounter (HOSPITAL_COMMUNITY): Payer: Self-pay | Admitting: *Deleted

## 2016-02-27 DIAGNOSIS — G894 Chronic pain syndrome: Secondary | ICD-10-CM | POA: Diagnosis present

## 2016-02-27 DIAGNOSIS — K2270 Barrett's esophagus without dysplasia: Secondary | ICD-10-CM | POA: Diagnosis not present

## 2016-02-27 DIAGNOSIS — Z85118 Personal history of other malignant neoplasm of bronchus and lung: Secondary | ICD-10-CM | POA: Diagnosis not present

## 2016-02-27 DIAGNOSIS — D630 Anemia in neoplastic disease: Secondary | ICD-10-CM | POA: Diagnosis present

## 2016-02-27 DIAGNOSIS — C179 Malignant neoplasm of small intestine, unspecified: Principal | ICD-10-CM | POA: Diagnosis present

## 2016-02-27 DIAGNOSIS — F4310 Post-traumatic stress disorder, unspecified: Secondary | ICD-10-CM | POA: Diagnosis present

## 2016-02-27 DIAGNOSIS — E1151 Type 2 diabetes mellitus with diabetic peripheral angiopathy without gangrene: Secondary | ICD-10-CM | POA: Diagnosis not present

## 2016-02-27 DIAGNOSIS — E785 Hyperlipidemia, unspecified: Secondary | ICD-10-CM | POA: Diagnosis present

## 2016-02-27 DIAGNOSIS — Z87891 Personal history of nicotine dependence: Secondary | ICD-10-CM

## 2016-02-27 DIAGNOSIS — C772 Secondary and unspecified malignant neoplasm of intra-abdominal lymph nodes: Secondary | ICD-10-CM | POA: Diagnosis not present

## 2016-02-27 DIAGNOSIS — F419 Anxiety disorder, unspecified: Secondary | ICD-10-CM | POA: Diagnosis present

## 2016-02-27 DIAGNOSIS — M797 Fibromyalgia: Secondary | ICD-10-CM | POA: Diagnosis present

## 2016-02-27 DIAGNOSIS — I739 Peripheral vascular disease, unspecified: Secondary | ICD-10-CM | POA: Diagnosis not present

## 2016-02-27 DIAGNOSIS — F323 Major depressive disorder, single episode, severe with psychotic features: Secondary | ICD-10-CM | POA: Diagnosis present

## 2016-02-27 DIAGNOSIS — Z79899 Other long term (current) drug therapy: Secondary | ICD-10-CM

## 2016-02-27 DIAGNOSIS — Z7984 Long term (current) use of oral hypoglycemic drugs: Secondary | ICD-10-CM

## 2016-02-27 DIAGNOSIS — Z87820 Personal history of traumatic brain injury: Secondary | ICD-10-CM

## 2016-02-27 DIAGNOSIS — C171 Malignant neoplasm of jejunum: Secondary | ICD-10-CM | POA: Diagnosis not present

## 2016-02-27 HISTORY — PX: LAPAROSCOPY: SHX197

## 2016-02-27 LAB — CBC
HCT: 30.3 % — ABNORMAL LOW (ref 39.0–52.0)
Hemoglobin: 9.3 g/dL — ABNORMAL LOW (ref 13.0–17.0)
MCH: 23.2 pg — ABNORMAL LOW (ref 26.0–34.0)
MCHC: 30.7 g/dL (ref 30.0–36.0)
MCV: 75.6 fL — ABNORMAL LOW (ref 78.0–100.0)
Platelets: 316 10*3/uL (ref 150–400)
RBC: 4.01 MIL/uL — ABNORMAL LOW (ref 4.22–5.81)
RDW: 16.4 % — ABNORMAL HIGH (ref 11.5–15.5)
WBC: 9.1 10*3/uL (ref 4.0–10.5)

## 2016-02-27 LAB — GLUCOSE, CAPILLARY
Glucose-Capillary: 162 mg/dL — ABNORMAL HIGH (ref 65–99)
Glucose-Capillary: 188 mg/dL — ABNORMAL HIGH (ref 65–99)
Glucose-Capillary: 229 mg/dL — ABNORMAL HIGH (ref 65–99)
Glucose-Capillary: 235 mg/dL — ABNORMAL HIGH (ref 65–99)
Glucose-Capillary: 253 mg/dL — ABNORMAL HIGH (ref 65–99)
Glucose-Capillary: 266 mg/dL — ABNORMAL HIGH (ref 65–99)

## 2016-02-27 LAB — TYPE AND SCREEN
ABO/RH(D): A POS
Antibody Screen: NEGATIVE

## 2016-02-27 LAB — CREATININE, SERUM
Creatinine, Ser: 0.84 mg/dL (ref 0.61–1.24)
GFR calc Af Amer: >60 mL/min (ref >60)
GFR calc non Af Amer: >60 mL/min (ref >60)

## 2016-02-27 LAB — HEMOGLOBIN A1C
Hgb A1c MFr Bld: 8.5 % — ABNORMAL HIGH (ref 4.8–5.6)
Mean Plasma Glucose: 197 mg/dL

## 2016-02-27 SURGERY — LAPAROSCOPY, DIAGNOSTIC
Anesthesia: General | Site: Abdomen

## 2016-02-27 MED ORDER — METOPROLOL TARTRATE 5 MG/5ML IV SOLN
INTRAVENOUS | Status: AC
  Filled 2016-02-27: qty 5

## 2016-02-27 MED ORDER — SODIUM CHLORIDE 0.9 % IJ SOLN
INTRAMUSCULAR | Status: DC | PRN
  Administered 2016-02-27: 10 mL via INTRAVENOUS

## 2016-02-27 MED ORDER — LACTATED RINGERS IV SOLN: INTRAVENOUS | Status: DC

## 2016-02-27 MED ORDER — MIDAZOLAM HCL 2 MG/2ML IJ SOLN
1.0000 mg | INTRAMUSCULAR | Status: DC | PRN
  Administered 2016-02-27: 0.5 mg via INTRAVENOUS

## 2016-02-27 MED ORDER — 0.9 % SODIUM CHLORIDE (POUR BTL) OPTIME
TOPICAL | Status: DC | PRN
  Administered 2016-02-27: 3000 mL

## 2016-02-27 MED ORDER — ACETAMINOPHEN 10 MG/ML IV SOLN
INTRAVENOUS | Status: AC
  Filled 2016-02-27: qty 100

## 2016-02-27 MED ORDER — SUCCINYLCHOLINE CHLORIDE 200 MG/10ML IV SOSY
PREFILLED_SYRINGE | INTRAVENOUS | Status: DC | PRN
  Administered 2016-02-27: 100 mg via INTRAVENOUS

## 2016-02-27 MED ORDER — ACETAMINOPHEN 10 MG/ML IV SOLN
1000.0000 mg | Freq: Once | INTRAVENOUS | Status: AC
  Administered 2016-02-27: 1000 mg via INTRAVENOUS
  Filled 2016-02-27: qty 100

## 2016-02-27 MED ORDER — KETAMINE HCL 10 MG/ML IJ SOLN
INTRAMUSCULAR | Status: DC | PRN
  Administered 2016-02-27: 10 mg via INTRAVENOUS
  Administered 2016-02-27: 30 mg via INTRAVENOUS
  Administered 2016-02-27 (×2): 10 mg via INTRAVENOUS

## 2016-02-27 MED ORDER — HYDROMORPHONE HCL 2 MG/ML IJ SOLN
INTRAMUSCULAR | Status: AC
  Filled 2016-02-27: qty 1

## 2016-02-27 MED ORDER — OXYCODONE HCL 5 MG PO TABS
5.0000 mg | ORAL_TABLET | ORAL | Status: DC | PRN
  Administered 2016-02-28: 5 mg via ORAL
  Administered 2016-02-28 – 2016-03-01 (×8): 10 mg via ORAL
  Filled 2016-02-27: qty 2
  Filled 2016-02-27: qty 1
  Filled 2016-02-27 (×7): qty 2

## 2016-02-27 MED ORDER — MIDAZOLAM HCL 2 MG/2ML IJ SOLN
INTRAMUSCULAR | Status: AC
  Filled 2016-02-27: qty 2

## 2016-02-27 MED ORDER — PANTOPRAZOLE SODIUM 40 MG IV SOLR
40.0000 mg | Freq: Every day | INTRAVENOUS | Status: DC
  Administered 2016-02-27 – 2016-02-28 (×2): 40 mg via INTRAVENOUS
  Filled 2016-02-27 (×5): qty 40

## 2016-02-27 MED ORDER — INSULIN ASPART 100 UNIT/ML ~~LOC~~ SOLN
0.0000 [IU] | SUBCUTANEOUS | Status: DC
  Administered 2016-02-27 (×2): 5 [IU] via SUBCUTANEOUS
  Administered 2016-02-27: 3 [IU] via SUBCUTANEOUS
  Administered 2016-02-28: 5 [IU] via SUBCUTANEOUS
  Administered 2016-02-28: 3 [IU] via SUBCUTANEOUS
  Administered 2016-02-28: 7 [IU] via SUBCUTANEOUS
  Administered 2016-02-28: 3 [IU] via SUBCUTANEOUS
  Administered 2016-02-28: 2 [IU] via SUBCUTANEOUS
  Administered 2016-02-28: 3 [IU] via SUBCUTANEOUS
  Administered 2016-02-29: 2 [IU] via SUBCUTANEOUS
  Administered 2016-02-29 (×3): 3 [IU] via SUBCUTANEOUS
  Administered 2016-02-29 (×2): 5 [IU] via SUBCUTANEOUS
  Administered 2016-03-01: 2 [IU] via SUBCUTANEOUS
  Administered 2016-03-01: 7 [IU] via SUBCUTANEOUS
  Administered 2016-03-01 (×2): 1 [IU] via SUBCUTANEOUS
  Administered 2016-03-01: 3 [IU] via SUBCUTANEOUS
  Administered 2016-03-01: 7 [IU] via SUBCUTANEOUS

## 2016-02-27 MED ORDER — ONDANSETRON HCL 4 MG/2ML IJ SOLN
4.0000 mg | Freq: Four times a day (QID) | INTRAMUSCULAR | Status: DC | PRN
  Administered 2016-02-27 – 2016-02-28 (×2): 4 mg via INTRAVENOUS
  Filled 2016-02-27 (×2): qty 2

## 2016-02-27 MED ORDER — LACTATED RINGERS IV SOLN
INTRAVENOUS | Status: DC | PRN
  Administered 2016-02-27 (×3): via INTRAVENOUS

## 2016-02-27 MED ORDER — ROCURONIUM BROMIDE 50 MG/5ML IV SOSY
PREFILLED_SYRINGE | INTRAVENOUS | Status: AC
  Filled 2016-02-27: qty 5

## 2016-02-27 MED ORDER — EPHEDRINE SULFATE 50 MG/ML IJ SOLN
INTRAMUSCULAR | Status: DC | PRN
  Administered 2016-02-27 (×2): 10 mg via INTRAVENOUS

## 2016-02-27 MED ORDER — METOPROLOL TARTRATE 5 MG/5ML IV SOLN
INTRAVENOUS | Status: DC | PRN
  Administered 2016-02-27: 1 mg via INTRAVENOUS

## 2016-02-27 MED ORDER — SUCCINYLCHOLINE CHLORIDE 200 MG/10ML IV SOSY
PREFILLED_SYRINGE | INTRAVENOUS | Status: AC
  Filled 2016-02-27: qty 10

## 2016-02-27 MED ORDER — ROCURONIUM BROMIDE 50 MG/5ML IV SOSY
PREFILLED_SYRINGE | INTRAVENOUS | Status: DC | PRN
  Administered 2016-02-27 (×2): 10 mg via INTRAVENOUS
  Administered 2016-02-27: 50 mg via INTRAVENOUS

## 2016-02-27 MED ORDER — FENTANYL CITRATE (PF) 250 MCG/5ML IJ SOLN
INTRAMUSCULAR | Status: AC
  Filled 2016-02-27: qty 5

## 2016-02-27 MED ORDER — FENTANYL CITRATE (PF) 100 MCG/2ML IJ SOLN
INTRAMUSCULAR | Status: DC | PRN
  Administered 2016-02-27 (×5): 50 ug via INTRAVENOUS

## 2016-02-27 MED ORDER — DEXAMETHASONE SODIUM PHOSPHATE 10 MG/ML IJ SOLN
INTRAMUSCULAR | Status: DC | PRN
  Administered 2016-02-27: 10 mg via INTRAVENOUS

## 2016-02-27 MED ORDER — HYDROMORPHONE HCL 1 MG/ML IJ SOLN
INTRAMUSCULAR | Status: AC
  Filled 2016-02-27: qty 1

## 2016-02-27 MED ORDER — CEFOTETAN DISODIUM-DEXTROSE 2-2.08 GM-% IV SOLR
INTRAVENOUS | Status: AC
  Filled 2016-02-27: qty 50

## 2016-02-27 MED ORDER — PROPOFOL 10 MG/ML IV BOLUS
INTRAVENOUS | Status: DC | PRN
  Administered 2016-02-27: 140 mg via INTRAVENOUS

## 2016-02-27 MED ORDER — MIDAZOLAM HCL 5 MG/5ML IJ SOLN
INTRAMUSCULAR | Status: DC | PRN
Start: 2016-02-27 — End: 2016-02-27
  Administered 2016-02-27: 2 mg via INTRAVENOUS

## 2016-02-27 MED ORDER — HEPARIN SODIUM (PORCINE) 5000 UNIT/ML IJ SOLN
5000.0000 [IU] | Freq: Three times a day (TID) | INTRAMUSCULAR | Status: DC
  Administered 2016-02-27 – 2016-03-02 (×11): 5000 [IU] via SUBCUTANEOUS
  Filled 2016-02-27 (×15): qty 1

## 2016-02-27 MED ORDER — KCL IN DEXTROSE-NACL 20-5-0.45 MEQ/L-%-% IV SOLN
INTRAVENOUS | Status: DC
  Administered 2016-02-27 – 2016-02-28 (×3): via INTRAVENOUS
  Administered 2016-02-29: 50 mL/h via INTRAVENOUS
  Filled 2016-02-27 (×4): qty 1000

## 2016-02-27 MED ORDER — EPHEDRINE 5 MG/ML INJ
INTRAVENOUS | Status: AC
  Filled 2016-02-27: qty 10

## 2016-02-27 MED ORDER — SUGAMMADEX SODIUM 200 MG/2ML IV SOLN
INTRAVENOUS | Status: DC | PRN
  Administered 2016-02-27: 150 mg via INTRAVENOUS

## 2016-02-27 MED ORDER — LIDOCAINE 2% (20 MG/ML) 5 ML SYRINGE
INTRAMUSCULAR | Status: DC | PRN
  Administered 2016-02-27: 100 mg via INTRAVENOUS

## 2016-02-27 MED ORDER — ONDANSETRON 4 MG PO TBDP
4.0000 mg | ORAL_TABLET | Freq: Four times a day (QID) | ORAL | Status: DC | PRN
  Filled 2016-02-27: qty 1

## 2016-02-27 MED ORDER — ONDANSETRON HCL 4 MG/2ML IJ SOLN
INTRAMUSCULAR | Status: AC
  Filled 2016-02-27: qty 2

## 2016-02-27 MED ORDER — SUGAMMADEX SODIUM 200 MG/2ML IV SOLN
INTRAVENOUS | Status: AC
  Filled 2016-02-27: qty 2

## 2016-02-27 MED ORDER — LIDOCAINE 2% (20 MG/ML) 5 ML SYRINGE
INTRAMUSCULAR | Status: AC
  Filled 2016-02-27: qty 5

## 2016-02-27 MED ORDER — DEXTROSE 5 % IV SOLN
2.0000 g | Freq: Two times a day (BID) | INTRAVENOUS | Status: AC
  Administered 2016-02-27: 2 g via INTRAVENOUS
  Filled 2016-02-27 (×2): qty 2

## 2016-02-27 MED ORDER — MORPHINE SULFATE (PF) 10 MG/ML IV SOLN
1.0000 mg | INTRAVENOUS | Status: DC | PRN
  Administered 2016-02-27 – 2016-02-28 (×7): 1 mg via INTRAVENOUS
  Filled 2016-02-27 (×7): qty 1

## 2016-02-27 MED ORDER — ONDANSETRON HCL 4 MG/2ML IJ SOLN
INTRAMUSCULAR | Status: DC | PRN
  Administered 2016-02-27: 4 mg via INTRAVENOUS

## 2016-02-27 MED ORDER — HEPARIN SODIUM (PORCINE) 5000 UNIT/ML IJ SOLN
5000.0000 [IU] | Freq: Once | INTRAMUSCULAR | Status: AC
  Administered 2016-02-27: 5000 [IU] via SUBCUTANEOUS
  Filled 2016-02-27: qty 1

## 2016-02-27 MED ORDER — INSULIN ASPART 100 UNIT/ML ~~LOC~~ SOLN
SUBCUTANEOUS | Status: AC
  Filled 2016-02-27: qty 1

## 2016-02-27 MED ORDER — BUPIVACAINE LIPOSOME 1.3 % IJ SUSP
20.0000 mL | Freq: Once | INTRAMUSCULAR | Status: AC
  Administered 2016-02-27: 20 mL
  Filled 2016-02-27: qty 20

## 2016-02-27 MED ORDER — DEXAMETHASONE SODIUM PHOSPHATE 10 MG/ML IJ SOLN
INTRAMUSCULAR | Status: AC
  Filled 2016-02-27: qty 1

## 2016-02-27 MED ORDER — SODIUM CHLORIDE 0.9 % IJ SOLN
INTRAMUSCULAR | Status: AC
  Filled 2016-02-27: qty 10

## 2016-02-27 MED ORDER — CEFOTETAN DISODIUM-DEXTROSE 2-2.08 GM-% IV SOLR
2.0000 g | INTRAVENOUS | Status: AC
  Administered 2016-02-27: 2 g via INTRAVENOUS

## 2016-02-27 MED ORDER — HYDROMORPHONE HCL 1 MG/ML IJ SOLN
0.2500 mg | INTRAMUSCULAR | Status: DC | PRN
  Administered 2016-02-27 (×2): 0.5 mg via INTRAVENOUS
  Administered 2016-02-27: 0.25 mg via INTRAVENOUS
  Administered 2016-02-27 (×2): 0.5 mg via INTRAVENOUS

## 2016-02-27 SURGICAL SUPPLY — 77 items
APPLICATOR COTTON TIP 6IN STRL (MISCELLANEOUS) ×1 IMPLANT
BLADE EXTENDED COATED 6.5IN (ELECTRODE) IMPLANT
BLADE HEX COATED 2.75 (ELECTRODE) ×3 IMPLANT
CABLE HIGH FREQUENCY MONO STRZ (ELECTRODE) ×3 IMPLANT
CHLORAPREP W/TINT 26ML (MISCELLANEOUS) IMPLANT
COUNTER NEEDLE 20 DBL MAG RED (NEEDLE) ×1 IMPLANT
COVER MAYO STAND STRL (DRAPES) ×3 IMPLANT
COVER SURGICAL LIGHT HANDLE (MISCELLANEOUS) ×6 IMPLANT
DECANTER SPIKE VIAL GLASS SM (MISCELLANEOUS) ×2 IMPLANT
DRAIN CHANNEL 19F RND (DRAIN) IMPLANT
DRAPE LAPAROSCOPIC ABDOMINAL (DRAPES) ×3 IMPLANT
DRAPE WARM FLUID 44X44 (DRAPE) ×3 IMPLANT
DRSG OPSITE POSTOP 4X10 (GAUZE/BANDAGES/DRESSINGS) IMPLANT
DRSG OPSITE POSTOP 4X6 (GAUZE/BANDAGES/DRESSINGS) ×2 IMPLANT
DRSG OPSITE POSTOP 4X8 (GAUZE/BANDAGES/DRESSINGS) IMPLANT
DRSG TEGADERM 2-3/8X2-3/4 SM (GAUZE/BANDAGES/DRESSINGS) ×2 IMPLANT
ELECT PENCIL ROCKER SW 15FT (MISCELLANEOUS) ×2 IMPLANT
ELECT REM PT RETURN 15FT ADLT (MISCELLANEOUS) ×3 IMPLANT
ELECT REM PT RETURN 9FT ADLT (ELECTROSURGICAL) ×3
ELECTRODE REM PT RTRN 9FT ADLT (ELECTROSURGICAL) ×2 IMPLANT
EVACUATOR SILICONE 100CC (DRAIN) IMPLANT
GAUZE SPONGE 2X2 8PLY STRL LF (GAUZE/BANDAGES/DRESSINGS) ×1 IMPLANT
GAUZE SPONGE 4X4 12PLY STRL (GAUZE/BANDAGES/DRESSINGS) ×1 IMPLANT
GLOVE BIOGEL M 8.0 STRL (GLOVE) ×6 IMPLANT
GOWN SPEC L4 XLG W/TWL (GOWN DISPOSABLE) ×1 IMPLANT
GOWN STRL REUS W/TWL XL LVL3 (GOWN DISPOSABLE) ×12 IMPLANT
HANDLE STAPLE EGIA 4 XL (STAPLE) ×2 IMPLANT
HANDLE SUCTION POOLE (INSTRUMENTS) IMPLANT
IRRIG SUCT STRYKERFLOW 2 WTIP (MISCELLANEOUS)
IRRIGATION SUCT STRKRFLW 2 WTP (MISCELLANEOUS) ×1 IMPLANT
KIT BASIN OR (CUSTOM PROCEDURE TRAY) ×1 IMPLANT
LEGGING LITHOTOMY PAIR STRL (DRAPES) IMPLANT
NS IRRIG 1000ML POUR BTL (IV SOLUTION) ×6 IMPLANT
PACK COLON (CUSTOM PROCEDURE TRAY) ×3 IMPLANT
PACK GENERAL/GYN (CUSTOM PROCEDURE TRAY) ×1 IMPLANT
PAD POSITIONING PINK XL (MISCELLANEOUS) ×2 IMPLANT
RELOAD EGIA 60 TAN VASC (STAPLE) ×14 IMPLANT
RELOAD STAPLE 45 PURP MED/THCK (STAPLE) IMPLANT
RELOAD TRI 45 ART MED THCK BLK (STAPLE) IMPLANT
RELOAD TRI 45 ART MED THCK PUR (STAPLE) IMPLANT
RELOAD TRI 60 ART MED THCK BLK (STAPLE) IMPLANT
RELOAD TRI 60 ART MED THCK PUR (STAPLE) IMPLANT
SCISSORS LAP 5X45 EPIX DISP (ENDOMECHANICALS) ×3 IMPLANT
SEALER TISSUE X1 CVD JAW (INSTRUMENTS) IMPLANT
SET IRRIG TUBING LAPAROSCOPIC (IRRIGATION / IRRIGATOR) ×2 IMPLANT
SHEARS HARMONIC ACE PLUS 45CM (MISCELLANEOUS) IMPLANT
SLEEVE ADV FIXATION 5X100MM (TROCAR) ×3 IMPLANT
SPONGE DRAIN TRACH 4X4 STRL 2S (GAUZE/BANDAGES/DRESSINGS) IMPLANT
SPONGE GAUZE 2X2 STER 10/PKG (GAUZE/BANDAGES/DRESSINGS) ×1
SPONGE LAP 18X18 X RAY DECT (DISPOSABLE) ×2 IMPLANT
STAPLER VISISTAT 35W (STAPLE) ×1 IMPLANT
SUCTION POOLE HANDLE (INSTRUMENTS)
SUT ETHILON 3 0 PS 1 (SUTURE) IMPLANT
SUT MNCRL AB 4-0 PS2 18 (SUTURE) IMPLANT
SUT NOVA 1 T20/GS 25DT (SUTURE) IMPLANT
SUT PDS AB 1 CTX 36 (SUTURE) IMPLANT
SUT PDS AB 1 TP1 96 (SUTURE) IMPLANT
SUT PDS AB 4-0 SH 27 (SUTURE) ×8 IMPLANT
SUT SILK 2 0 (SUTURE)
SUT SILK 2 0 SH CR/8 (SUTURE) ×1 IMPLANT
SUT SILK 2-0 18XBRD TIE 12 (SUTURE) ×1 IMPLANT
SUT SILK 3 0 (SUTURE)
SUT SILK 3 0 SH CR/8 (SUTURE) ×9 IMPLANT
SUT SILK 3-0 18XBRD TIE 12 (SUTURE) ×1 IMPLANT
SUT VIC AB 3-0 SH 18 (SUTURE) IMPLANT
SUT VICRYL 2 0 18  UND BR (SUTURE)
SUT VICRYL 2 0 18 UND BR (SUTURE) IMPLANT
SYR 10ML ECCENTRIC (SYRINGE) ×3 IMPLANT
SYS LAPSCP GELPORT 120MM (MISCELLANEOUS) ×3
SYSTEM LAPSCP GELPORT 120MM (MISCELLANEOUS) ×1 IMPLANT
TOWEL OR 17X26 10 PK STRL BLUE (TOWEL DISPOSABLE) ×3 IMPLANT
TOWEL OR NON WOVEN STRL DISP B (DISPOSABLE) ×3 IMPLANT
TRAY FOLEY W/METER SILVER 16FR (SET/KITS/TRAYS/PACK) ×3 IMPLANT
TROCAR ADV FIXATION 5X100MM (TROCAR) ×3 IMPLANT
TROCAR BLADELESS OPT 5 100 (ENDOMECHANICALS) ×3 IMPLANT
TROCAR XCEL 12X100 BLDLESS (ENDOMECHANICALS) IMPLANT
TUBING INSUF HEATED (TUBING) ×3 IMPLANT

## 2016-02-27 NOTE — Op Note (Signed)
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  After anesthesia was administered and the patient was prepped a timeout was performed.  Access to the abdomen achieved with 5 mm Optiview through the left upper quadrant.  Two other 5 mm placed including one in the midline where subsequent laparotomy was done.  The two area of small bowel involvement were noted and grasped with graspers.  The liver was inspected and no gross mets were seen.  I ran the remainder of the small bowel to the cecum and no other lesions were seen.   A midline incision was made and the wound protector inserted.  I identified the ligament of Treitz and found the proximal near obstructing lesion that was about 18 inches distal to the LT.  I divided some stuck omentum and freed the mass into the wound.  There was a small golf ball size mass in the deep mesentery along with a secondary marbled sized mass.  I divided the bowel proximally and distally with a Covidien 6 cm endostapler with a brown load (all brown loads used in this case).  I divided the mesentery with the Ligasure and hugged the masses as I was able to get around them at the root of the mesentery and I removed the specimen.  The deep mesentery spared the major vessels and I did oversewn a venous bleeder in that location.  A side to side anastomosis was then created with alligning the two ends of small bowel and opening along the antimesenteric border.  The 6 cm stapler was inserted to create an anastomosis and the common defect was closed in two layers  with 4-0 PDS and Lembert closure with 4-0 silk.  Crotch stitch placed and the mesenteric defect was closed with interrupted 2-0 silk.    The more distal cancer was resected in a similar fashion getting good proximal and distal margins.  The mesentery involvement did not seem as extensive as the one proximally.  A side to side anastomosis was then created with alligning the two ends of small bowel and opening along the antimesenteric border.  The 6 cm stapler was inserted to create an anastomosis and the common defect was closed in two layers with 4-0 PDS and Lembert closure with 4-0 silk.  Crotch stitch placed and the mesenteric defect was closed with interrupted 2-0 silk.    There was essentially no spillage.  The abdomen was irrigated with two liters of saline and no bleeding was seen.  The abdomen was closed with double stranded 0 PDS and staples.  The two remaining laparoscopy holes were closed with staples.  All wounds were injected with Exparel 30 cc.    The patient tolerated the procedure well and was taken to the PACU in stable condition.     Matt B. Hassell Done, [place], [facility], [place]

## 2016-02-27 NOTE — Anesthesia Procedure Notes (Signed)
Procedure Name: Intubation Date/Time: 02/27/2016 10:06 AM Performed by: Maxwell Caul Pre-anesthesia Checklist: Patient identified, Emergency Drugs available, Suction available and Patient being monitored Patient Re-evaluated:Patient Re-evaluated prior to inductionOxygen Delivery Method: Circle system utilized Preoxygenation: Pre-oxygenation with 100% oxygen Intubation Type: IV induction Ventilation: Mask ventilation without difficulty Laryngoscope Size: Mac and 4 Grade View: Grade I Tube type: Oral Tube size: 7.5 mm Number of attempts: 1 Airway Equipment and Method: Stylet Placement Confirmation: ETT inserted through vocal cords under direct vision,  positive ETCO2 and breath sounds checked- equal and bilateral Secured at: 21 cm Tube secured with: Tape Dental Injury: Teeth and Oropharynx as per pre-operative assessment

## 2016-02-27 NOTE — Progress Notes (Signed)
Inpatient Diabetes Program Recommendations  AACE/ADA: New Consensus Statement on Inpatient Glycemic Control (2015)  Target Ranges:  Prepandial:   less than 140 mg/dL      Peak postprandial:   less than 180 mg/dL (1-2 hours)      Critically ill patients:  140 - 180 mg/dL   Review of Glycemic Control  Diabetes history: DM 2 Outpatient Diabetes medications: Januvia 100 mg Daily, Metformin 500 mg BID, Glipizide 20 mg BID Current orders for Inpatient glycemic control: None pt in OR  Inpatient Diabetes Program Recommendations:   Patient currently in OR. Patient on 3 DM oral medications at home. Consider Novolog Moderate Correction Q 4 hours after surgery.  Thanks,  Tama Headings RN, MSN, [facility] Inpatient Diabetes Coordinator Team Pager [phone] (8a-5p)

## 2016-02-27 NOTE — Anesthesia Postprocedure Evaluation (Signed)
Anesthesia Post Note  Patient: Brett [name]  Procedure(s) Performed: Procedure(s) (LRB): LAPAROSCOPY, LAPAROTOMY  WITH TWO SMALL BOWEL RESECTION (N/A)  Patient location during evaluation: PACU Anesthesia Type: General Level of consciousness: awake and alert Pain management: pain level controlled Vital Signs Assessment: post-procedure vital signs reviewed and stable Respiratory status: spontaneous breathing, nonlabored ventilation, respiratory function stable and patient connected to nasal cannula oxygen Cardiovascular status: blood pressure returned to baseline and stable Postop Assessment: no signs of nausea or vomiting Anesthetic complications: no       Last Vitals:  Vitals:   02/27/16 1345 02/27/16 1400  BP: (!) 159/79 119/77  Pulse: 86   Resp: 11 12  Temp: 36.8 C 36.7 C    Last Pain:  Vitals:   02/27/16 1345  TempSrc:   PainSc: 5                  Dannon Nguyenthi,W. EDMOND

## 2016-02-27 NOTE — Interval H&P Note (Signed)
History and Physical Interval Note:  02/27/2016 9:44 AM  Brett [name]  has presented today for surgery, with the diagnosis of CANCER OF SMALL BOWEL  The various methods of treatment have been discussed with the patient and family. After consideration of risks, benefits and other options for treatment, the patient has consented to  Procedure(s): LAPAROSCOPY WITH SMALL BOWEL RESECTION (N/A) EXPLORATORY LAPAROTOMY (N/A) as a surgical intervention .  The patient's history has been reviewed, patient examined, no change in status, stable for surgery.  I have reviewed the patient's chart and labs.  Questions were answered to the patient's satisfaction.     Genine Beckett B

## 2016-02-27 NOTE — Transfer of Care (Signed)
Immediate Anesthesia Transfer of Care Note  Patient: Brett [name]  Procedure(s) Performed: Procedure(s): LAPAROSCOPY, LAPAROTOMY  WITH TWO SMALL BOWEL RESECTION (N/A)  Patient Location: PACU  Anesthesia Type:General  Level of Consciousness:  sedated, patient cooperative and responds to stimulation  Airway & Oxygen Therapy:Patient Spontanous Breathing and Patient connected to face mask oxgen  Post-op Assessment:  Report given to PACU RN and Post -op Vital signs reviewed and stable  Post vital signs:  Reviewed and stable  Last Vitals:  Vitals:   02/27/16 0808  BP: 90/68  Pulse: 96  Resp: 18  Temp: 41.7 C    Complications: No apparent anesthesia complications

## 2016-02-27 NOTE — H&P (Signed)
Chief Complaint:  Recurrent anemia and evidence of small bowel neoplasms  History of Present Illness:  Brett [name] is an 56 y.o. male who underwent pulmonary resection for lung cancer and has later presented with at least two small bowel neoplasms producing anemia and partial obstruction.  For laparoscopy and open resection.    Past Medical History:  Diagnosis Date  . Anemia 11/28/2015  . Anxiety   . Arthritis    "hands, back" (11/28/2015)  . Barrett's esophagus 07/01/2013   Without dysplasia on biopsy 09/03/2012. Repeat EGD recommended 08/2015  . Carotid artery stenosis 07/01/2013   Requiring right sided stent   . Chronic pain syndrome 07/01/2013  . Closed head injury with brief loss of consciousness ([place]) 07/22/2010   Head trapped in a hydraulic device at work.  Fracture of orbital bones on right and brief loss of consciousness per report.  . 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 There was likely a strong psycho-social overlay in regard to the cognitive deficits in the form of mood disorder with psychotic features and mixed anxiety symptomatology. His primary tested cognitive deficits are in the areas of attention, executi  . Daily headache "since 07/2010"   constantly  . Degenerative joint disease of cervical spine 07/01/2013  . Dupuytren's contracture of both hands 04/08/2014  . Encounter for antineoplastic chemotherapy 10/05/2015  . Erectile dysfunction associated with type 2 diabetes mellitus ([place]) 07/01/2013  . Fibromyalgia 07/01/2013  . History of blood transfusion 11/28/2015   "suppose to get his first today" (11/28/2015)  . Hyperlipidemia LDL goal < 100 07/01/2013  . 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 Memory changes    "memory issues" from head injury  . Osteoarthritis of right thumb 10/21/2014  . Peripheral vascular occlusive disease  ([place]) 07/01/2013   Requiring 2 arterial stents above the left knee per report  . Pneumonia ~ 2006/2007  . Post traumatic stress disorder 07/01/2013  . Primary lung adenocarcinoma ([place]) dx'd 08/2015   "right lung"  . Severe major depression with psychotic features ([place]) 04/15/2011  . Tobacco abuse 07/01/2013  . Tobacco abuse   . Type 2 diabetes mellitus with vascular disease ([place]) 07/01/2013   Left lower extremity and right carotid stenting    Past Surgical History:  Procedure Laterality Date  . CAROTID STENT Right ?2014  . COLONOSCOPY N/A 11/30/2015   Procedure: COLONOSCOPY;  Surgeon: Teena Irani, MD;  Location: [facility] ENDOSCOPY;  Service: Endoscopy;  Laterality: N/A;  . ESOPHAGOGASTRODUODENOSCOPY N/A 11/30/2015   Procedure: ESOPHAGOGASTRODUODENOSCOPY (EGD);  Surgeon: Teena Irani, MD;  Location: [facility] ENDOSCOPY;  Service: Endoscopy;  Laterality: N/A;  . ESOPHAGOGASTRODUODENOSCOPY (EGD) WITH PROPOFOL N/A 01/05/2016   Procedure: ESOPHAGOGASTRODUODENOSCOPY (EGD) WITH PROPOFOL;  Surgeon: Teena Irani, MD;  Location: [facility] ENDOSCOPY;  Service: Endoscopy;  Laterality: N/A;  . FEMORAL ARTERY STENT Left 05/2012; ~ 2015   Archie Endo 06/04/2012; Raechel Chute report  . FRACTURE SURGERY    . GIVENS CAPSULE STUDY N/A 12/22/2015   Procedure: GIVENS CAPSULE STUDY;  Surgeon: Wonda Horner, MD;  Location: [facility] ENDOSCOPY;  Service: Endoscopy;  Laterality: N/A;  . HARDWARE REMOVAL Right 11/15/2011   Removal of deep frontozygomatic orbital hardware/notes 11/15/2011  . HERNIA REPAIR  0175   Umbilical  . ORIF ORBITAL FRACTURE Right 08/15/2010    caught in a hydraulic machine; open reduction internal fixation of orbital rim fracture and open reduction of zygomatic arch fracture  /  notes 10/13/2009  . VIDEO ASSISTED THORACOSCOPY (VATS)/ LOBECTOMY Right 10/18/2015   Procedure: VIDEO ASSISTED THORACOSCOPY (VATS)/ LOBECTOMY;  Surgeon: Melrose Nakayama, MD;  Location: [facility];  Service: Thoracic;  Laterality: Right;  Marland Kitchen VIDEO BRONCHOSCOPY  Bilateral 09/21/2015   Procedure: VIDEO BRONCHOSCOPY WITH FLUORO;  Surgeon: Juanito Doom, MD;  Location: [facility] ENDOSCOPY;  Service: Cardiopulmonary;  Laterality: Bilateral;    Current Facility-Administered Medications  Medication Dose Route Frequency Provider Last Rate Last Dose  . cefoTEtan in Dextrose 5% (CEFOTAN) IVPB 2 g  2 g Intravenous On Call to OR Johnathan Hausen, MD       Facility-Administered Medications Ordered in Other Encounters  Medication Dose Route Frequency Provider Last Rate Last Dose  . lactated ringers infusion    Continuous PRN Dione Booze, CRNA       Gabapentin and Celebrex [celecoxib] Family History  Problem Relation Age of Onset  . Heart disease Mother   . Diabetes Mother   . Diabetes Father   . Heart attack Father   . Diabetes Sister   . Coronary artery disease Sister     s/p CABG  . Diabetes Brother   . Healthy Daughter   . Healthy Son   . Diabetes Sister   . Healthy Sister   . Healthy Sister   . Diabetes Brother   . Coronary artery disease Brother     s/p CABG  . Diabetes Brother   . Healthy Brother   . Healthy Brother   . Healthy Daughter    Social History:   reports that he quit smoking about 4 months ago. His smoking use included Cigarettes. He has a 35.00 pack-year smoking history. He has never used smokeless tobacco. He reports that he drinks alcohol. He reports that he does not use drugs.   REVIEW OF SYSTEMS : Negative except for see extensive problem list  Physical Exam:   Blood pressure 90/68, pulse 96, temperature 97.7 F (36.5 C), temperature source Oral, resp. rate 18, height '5\' 7"'$  (1.702 m), weight 60.3 kg (133 lb), SpO2 100 %. Body mass index is 20.83 kg/m.  Gen:  WDWN WM NAD  Neurological: Alert and oriented to person, place, and time. Motor and sensory function is grossly intact  Head: Normocephalic and atraumatic.  Eyes: Conjunctivae are normal. Pupils are equal, round, and reactive to light. No scleral icterus.   Neck: Normal range of motion. Neck supple. No tracheal deviation or thyromegaly present.  Cardiovascular:  SR without murmurs or gallops.  No carotid bruits Breast:  Not examined Respiratory: Effort normal.  No respiratory distress. No chest wall tenderness. Breath sounds normal.  No wheezes, rales or rhonchi.  Abdomen:  Flat and nontender GU:  Not examined Musculoskeletal: Normal range of motion. Extremities are nontender. No cyanosis, edema or clubbing noted Lymphadenopathy: No cervical, preauricular, postauricular or axillary adenopathy is present Skin: Skin is warm and dry. No rash noted. No diaphoresis. No erythema. No pallor. Pscyh: Normal mood and affect. Behavior is normal. Judgment and thought content normal.   LABORATORY RESULTS: Results for orders placed or performed during the hospital encounter of 02/27/16 (from the past 48 hour(s))  Glucose, capillary     Status: Abnormal   Collection Time: 02/27/16  8:03 AM  Result Value Ref Range   Glucose-Capillary 162 (H) 65 - 99 mg/dL   Comment 1 Notify RN      RADIOLOGY RESULTS: No results found.  Problem List: Patient Active Problem List   Diagnosis Date Noted  .  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 Carcinoma ([place]) 01/17/2016  . Malnutrition of moderate degree 12/22/2015  . GI bleed 12/21/2015  . Hypotension 12/07/2015  . Shortness of breath 11/30/2015  . Protein-calorie malnutrition, severe 11/29/2015  . Iron deficiency anemia due to chronic blood loss 11/28/2015  . Panic disorder 11/24/2015  . S/P lobectomy of lung 10/18/2015  . Encounter for antineoplastic chemotherapy 10/05/2015  . Primary lung adenocarcinoma ([place]) 08/25/2015  . Osteoarthritis of right thumb 10/21/2014  . Dupuytren's contracture of both hands 04/08/2014  . Healthcare maintenance 10/08/2013  . Post traumatic stress disorder 07/02/2013  . Type 2 diabetes mellitus with vascular disease ([place]) 07/01/2013  . Peripheral vascular occlusive disease  ([place]) 07/01/2013  . 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 Erectile dysfunction associated with type 2 diabetes mellitus ([place]) 07/01/2013  . Hyperlipidemia 07/01/2013  . Fibromyalgia 07/01/2013  . Degenerative joint disease of cervical spine 07/01/2013  . Barrett's esophagus 07/01/2013  . Chronic pain syndrome 07/01/2013  . Severe major depression with psychotic features ([place]) 04/15/2011  . Cognitive disorder 04/15/2011  . Closed head injury with brief loss of consciousness ([place]) 08/10/2010    Assessment & Plan: Metastatic vs primary cancer involving the bowels;  Plan laparoscopic staging and likely small bowel resection.      Matt B. Hassell Done, MD, [facility] [phone] beeper [phone]  02/27/2016 9:38 AM

## 2016-02-27 NOTE — Anesthesia Preprocedure Evaluation (Addendum)
Anesthesia Evaluation  Patient identified by MRN, date of birth, ID band Patient awake    Reviewed: Allergy & Precautions, H&P , NPO status , Patient's Chart, lab work & pertinent test results  Airway Mallampati: I  TM Distance: >3 FB Neck ROM: Full    Dental no notable dental hx. (+) Edentulous Upper, Edentulous Lower, Dental Advisory Given   Pulmonary neg pulmonary ROS, former smoker,    Pulmonary exam normal breath sounds clear to auscultation       Cardiovascular + Peripheral Vascular Disease  negative cardio ROS   Rhythm:Regular Rate:Normal     Neuro/Psych  Headaches, Anxiety Depression    GI/Hepatic negative GI ROS, Neg liver ROS,   Endo/Other  diabetes, Type 2, Oral Hypoglycemic Agents  Renal/GU negative Renal ROS  negative genitourinary   Musculoskeletal  (+) Arthritis , Osteoarthritis,    Abdominal   Peds  Hematology negative hematology ROS (+) anemia ,   Anesthesia Other Findings   Reproductive/Obstetrics negative OB ROS                            Anesthesia Physical Anesthesia Plan  ASA: III  Anesthesia Plan: General   Post-op Pain Management:    Induction: Intravenous  Airway Management Planned: Oral ETT  Additional Equipment:   Intra-op Plan:   Post-operative Plan: Extubation in OR  Informed Consent: I have reviewed the patients History and Physical, chart, labs and discussed the procedure including the risks, benefits and alternatives for the proposed anesthesia with the patient or authorized representative who has indicated his/her understanding and acceptance.   Dental advisory given  Plan Discussed with: CRNA  Anesthesia Plan Comments:        Anesthesia Quick Evaluation

## 2016-02-28 LAB — CBC
HCT: 29.7 % — ABNORMAL LOW (ref 39.0–52.0)
Hemoglobin: 8.9 g/dL — ABNORMAL LOW (ref 13.0–17.0)
MCH: 22.3 pg — ABNORMAL LOW (ref 26.0–34.0)
MCHC: 30 g/dL (ref 30.0–36.0)
MCV: 74.4 fL — ABNORMAL LOW (ref 78.0–100.0)
Platelets: 323 10*3/uL (ref 150–400)
RBC: 3.99 MIL/uL — ABNORMAL LOW (ref 4.22–5.81)
RDW: 16.2 % — ABNORMAL HIGH (ref 11.5–15.5)
WBC: 11.2 10*3/uL — ABNORMAL HIGH (ref 4.0–10.5)

## 2016-02-28 LAB — GLUCOSE, CAPILLARY
Glucose-Capillary: 157 mg/dL — ABNORMAL HIGH (ref 65–99)
Glucose-Capillary: 214 mg/dL — ABNORMAL HIGH (ref 65–99)
Glucose-Capillary: 219 mg/dL — ABNORMAL HIGH (ref 65–99)
Glucose-Capillary: 239 mg/dL — ABNORMAL HIGH (ref 65–99)
Glucose-Capillary: 275 mg/dL — ABNORMAL HIGH (ref 65–99)
Glucose-Capillary: 312 mg/dL — ABNORMAL HIGH (ref 65–99)

## 2016-02-28 LAB — BASIC METABOLIC PANEL
Anion gap: 7 (ref 5–15)
BUN: 11 mg/dL (ref 6–20)
CO2: 26 mmol/L (ref 22–32)
Calcium: 8.4 mg/dL — ABNORMAL LOW (ref 8.9–10.3)
Chloride: 105 mmol/L (ref 101–111)
Creatinine, Ser: 0.73 mg/dL (ref 0.61–1.24)
GFR calc Af Amer: >60 mL/min (ref >60)
GFR calc non Af Amer: >60 mL/min (ref >60)
Glucose, Bld: 226 mg/dL — ABNORMAL HIGH (ref 65–99)
Potassium: 4.7 mmol/L (ref 3.5–5.1)
Sodium: 138 mmol/L (ref 135–145)

## 2016-02-28 MED ORDER — KETOROLAC TROMETHAMINE 15 MG/ML IJ SOLN
15.0000 mg | Freq: Once | INTRAMUSCULAR | Status: AC
  Administered 2016-02-28: 15 mg via INTRAMUSCULAR
  Filled 2016-02-28: qty 1

## 2016-02-28 MED ORDER — MORPHINE SULFATE (PF) 4 MG/ML IV SOLN: 1.0000 mg | INTRAVENOUS | Status: DC | PRN

## 2016-02-28 MED ORDER — BOOST / RESOURCE BREEZE PO LIQD
1.0000 | Freq: Three times a day (TID) | ORAL | Status: DC
  Administered 2016-02-28 – 2016-03-01 (×5): 1 via ORAL

## 2016-02-28 MED ORDER — MORPHINE SULFATE (PF) 4 MG/ML IV SOLN
2.0000 mg | INTRAVENOUS | Status: DC | PRN
  Administered 2016-02-28: 2 mg via INTRAVENOUS
  Filled 2016-02-28: qty 1

## 2016-02-28 NOTE — Progress Notes (Signed)
Initial Nutrition Assessment  DOCUMENTATION CODES:   Non-severe (moderate) malnutrition in context of chronic illness  INTERVENTION:   Provide Boost Breeze po TID, each supplement provides 250 kcal and 9 grams of protein Encourage PO intakes as tolerated RD to continue to monitor  NUTRITION DIAGNOSIS:   Malnutrition related to chronic illness as evidenced by energy intake < or equal to 75% for > or equal to 1 month, moderate depletion of body fat, moderate depletions of muscle mass.  GOAL:   Patient will meet greater than or equal to 90% of their needs  MONITOR:   PO intake, Supplement acceptance, Labs, Weight trends, I & O's  REASON FOR ASSESSMENT:   Malnutrition Screening Tool    ASSESSMENT:   56 y.o. male who underwent pulmonary resection for lung cancer and has later presented with at least two small bowel neoplasms producing anemia and partial obstruction.  For laparoscopy and open resection.   2/6: s/p Laparoscopy, laparotomy and resection of proximal jejunum (12 inches from ligament of Treitz) and distal jejunum/prox ileum  Patient in room with no family at bedside. Pt reports he is in a lot of pain, he has just received some pain medication. Pt reports poor appetite and inability to eat much at mealtimes for the past couple of months. Pt tried to drink Ensure supplements at home but found them to be too thick. Recommended mixing supplements with whole milk to make them thinner.  Pt is open to receiving Boost Breeze supplements since he is on clear liquids. Pt had a few sips of clears this morning but was afraid to eat too much for fear that he would get sick.  Pt states he has not eaten since Sunday 2/4.   Pt reports UBW is ~130-133 lb. Wt has remained stable since November 2017. Nutrition-Focused physical exam completed. Findings are moderate fat depletion, moderate muscle depletion, and no edema.   Medications: IV Protonix daily, D5 and .45% NaCl w/ KCl infusion at  100 ml/hr -provides 408 kcal, IV Zofran PRN Labs reviewed: CBGs: 214-239  Diet Order:  Diet clear liquid Room service appropriate? Yes; Fluid consistency: Thin  Skin:  Wound (see comment) (2/6 abdominal incision)  Last BM:  2/5  Height:   Ht Readings from Last 1 Encounters:  02/27/16 '5\' 7"'$  (1.702 m)    Weight:   Wt Readings from Last 1 Encounters:  02/27/16 133 lb (60.3 kg)    Ideal Body Weight:  67.3 kg  BMI:  Body mass index is 20.83 kg/m.  Estimated Nutritional Needs:   Kcal:  1800-2000  Protein:  80-90g  Fluid:  2L/day  EDUCATION NEEDS:   Education needs addressed  Clayton Bibles, MS, RD, LDN Pager: [phone] After Hours Pager: [phone]

## 2016-02-28 NOTE — Progress Notes (Signed)
Inpatient Diabetes Program Recommendations  AACE/ADA: New Consensus Statement on Inpatient Glycemic Control (2015)  Target Ranges:  Prepandial:   less than 140 mg/dL      Peak postprandial:   less than 180 mg/dL (1-2 hours)      Critically ill patients:  140 - 180 mg/dL    Results for RUBERT, FREDIANI (MRN [id number]) as of 02/28/2016 09:50  Ref. Range 02/27/2016 23:55 02/28/2016 03:57 02/28/2016 08:05  Glucose-Capillary Latest Ref Range: 65 - 99 mg/dL 229 (H) 214 (H) 239 (H)     Diabetes history: DM 2  Outpatient DM meds: Januvia 100 mg Daily, Metformin 500 mg BID, Glipizide 20 mg BID  Current Orders: Novolog Sensitive Correction Scale/ SSI (0-9 units) Q4 hours      MD- Please consider increasing Novolog Correction Scale/ SSI to Moderate scale (0-15 units) Q4 hours  (currently ordered as Sensitive scale 0-9 units Q4 hours)     --Will follow patient during hospitalization--  Wyn Quaker RN, MSN, CDE Diabetes Coordinator Inpatient Glycemic Control Team Team Pager: [phone] (8a-5p)

## 2016-02-28 NOTE — Progress Notes (Signed)
Patient ID: Brett [name], male   DOB: [birth year], 58 y.o.   MRN: [id number] [facility] Progress Note:   1 Day Post-Op  Subjective: Mental status is clear.  NG causing sore throat Objective: Vital signs in last 24 hours: Temp:  [97.5 F (36.4 C)-98.3 F (36.8 C)] 97.8 F (36.6 C) (02/07 0404) Pulse Rate:  [70-93] 73 (02/07 0404) Resp:  [0-21] 15 (02/07 0404) BP: (108-159)/(59-81) 113/59 (02/07 0404) SpO2:  [94 %-100 %] 100 % (02/07 0404) Weight:  [60.3 kg (133 lb)] 60.3 kg (133 lb) (02/06 0825)  Intake/Output from previous day: 02/06 0701 - 02/07 0700 In: 3780 [P.O.:30; I.V.:3700; IV Piggyback:50] Out: 4665 [Urine:2400; Emesis/NG output:40; Blood:50] Intake/Output this shift: No intake/output data recorded.  Physical Exam: Work of breathing is normal.    Lab Results:  Results for orders placed or performed during the hospital encounter of 02/27/16 (from the past 48 hour(s))  Glucose, capillary     Status: Abnormal   Collection Time: 02/27/16  8:03 AM  Result Value Ref Range   Glucose-Capillary 162 (H) 65 - 99 mg/dL   Comment 1 Notify RN   Glucose, capillary     Status: Abnormal   Collection Time: 02/27/16 11:15 AM  Result Value Ref Range   Glucose-Capillary 188 (H) 65 - 99 mg/dL   Comment 1 Document in Chart   Glucose, capillary     Status: Abnormal   Collection Time: 02/27/16  1:13 PM  Result Value Ref Range   Glucose-Capillary 235 (H) 65 - 99 mg/dL   Comment 1 Notify RN    Comment 2 Document in Chart   CBC     Status: Abnormal   Collection Time: 02/27/16  2:25 PM  Result Value Ref Range   WBC 9.1 4.0 - 10.5 K/uL   RBC 4.01 (L) 4.22 - 5.81 MIL/uL   Hemoglobin 9.3 (L) 13.0 - 17.0 g/dL   HCT 30.3 (L) 39.0 - 52.0 %   MCV 75.6 (L) 78.0 - 100.0 fL   MCH 23.2 (L) 26.0 - 34.0 pg   MCHC 30.7 30.0 - 36.0 g/dL   RDW 16.4 (H) 11.5 - 15.5 %   Platelets 316 150 - 400 K/uL  Creatinine, serum     Status: None   Collection Time: 02/27/16  2:25 PM  Result Value  Ref Range   Creatinine, Ser 0.84 0.61 - 1.24 mg/dL   GFR calc non Af Amer >60 >60 mL/min   GFR calc Af Amer >60 >60 mL/min    Comment: (NOTE) The eGFR has been calculated using the CKD EPI equation. This calculation has not been validated in all clinical situations. eGFR's persistently <60 mL/min signify possible Chronic Kidney Disease.   Glucose, capillary     Status: Abnormal   Collection Time: 02/27/16  4:10 PM  Result Value Ref Range   Glucose-Capillary 253 (H) 65 - 99 mg/dL  Glucose, capillary     Status: Abnormal   Collection Time: 02/27/16  8:36 PM  Result Value Ref Range   Glucose-Capillary 266 (H) 65 - 99 mg/dL  Glucose, capillary     Status: Abnormal   Collection Time: 02/27/16 11:55 PM  Result Value Ref Range   Glucose-Capillary 229 (H) 65 - 99 mg/dL  Glucose, capillary     Status: Abnormal   Collection Time: 02/28/16  3:57 AM  Result Value Ref Range   Glucose-Capillary 214 (H) 65 - 99 mg/dL  CBC     Status: Abnormal   Collection Time:  02/28/16  5:22 AM  Result Value Ref Range   WBC 11.2 (H) 4.0 - 10.5 K/uL   RBC 3.99 (L) 4.22 - 5.81 MIL/uL   Hemoglobin 8.9 (L) 13.0 - 17.0 g/dL   HCT 29.7 (L) 39.0 - 52.0 %   MCV 74.4 (L) 78.0 - 100.0 fL   MCH 22.3 (L) 26.0 - 34.0 pg   MCHC 30.0 30.0 - 36.0 g/dL   RDW 16.2 (H) 11.5 - 15.5 %   Platelets 323 150 - 400 K/uL  Basic metabolic panel     Status: Abnormal   Collection Time: 02/28/16  5:22 AM  Result Value Ref Range   Sodium 138 135 - 145 mmol/L   Potassium 4.7 3.5 - 5.1 mmol/L   Chloride 105 101 - 111 mmol/L   CO2 26 22 - 32 mmol/L   Glucose, Bld 226 (H) 65 - 99 mg/dL   BUN 11 6 - 20 mg/dL   Creatinine, Ser 0.73 0.61 - 1.24 mg/dL   Calcium 8.4 (L) 8.9 - 10.3 mg/dL   GFR calc non Af Amer >60 >60 mL/min   GFR calc Af Amer >60 >60 mL/min    Comment: (NOTE) The eGFR has been calculated using the CKD EPI equation. This calculation has not been validated in all clinical situations. eGFR's persistently <60 mL/min  signify possible Chronic Kidney Disease.    Anion gap 7 5 - 15  Glucose, capillary     Status: Abnormal   Collection Time: 02/28/16  8:05 AM  Result Value Ref Range   Glucose-Capillary 239 (H) 65 - 99 mg/dL    Radiology/Results: No results found.  Anti-infectives: Anti-infectives    Start     Dose/Rate Route Frequency Ordered Stop   02/27/16 2200  cefoTEtan (CEFOTAN) 2 g in dextrose 5 % 50 mL IVPB     2 g 100 mL/hr over 30 Minutes Intravenous Every 12 hours 02/27/16 1408 02/27/16 2236   02/27/16 0815  cefoTEtan in Dextrose 5% (CEFOTAN) IVPB 2 g     2 g Intravenous On call to O.R. 02/27/16 0804 02/27/16 1010      Assessment/Plan: Problem List: Patient Active Problem List   Diagnosis Date Noted  . 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 Carcinoma ([place]) 01/17/2016  . Malnutrition of moderate degree 12/22/2015  . GI bleed 12/21/2015  . Hypotension 12/07/2015  . Shortness of breath 11/30/2015  . Protein-calorie malnutrition, severe 11/29/2015  . Iron deficiency anemia due to chronic blood loss 11/28/2015  . Panic disorder 11/24/2015  . S/P lobectomy of lung 10/18/2015  . Encounter for antineoplastic chemotherapy 10/05/2015  . Primary lung adenocarcinoma ([place]) 08/25/2015  . Osteoarthritis of right thumb 10/21/2014  . Dupuytren's contracture of both hands 04/08/2014  . Healthcare maintenance 10/08/2013  . Post traumatic stress disorder 07/02/2013  . Type 2 diabetes mellitus with vascular disease ([place]) 07/01/2013  . Peripheral vascular occlusive disease ([place]) 07/01/2013  . 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 Erectile dysfunction associated with type 2 diabetes mellitus ([place]) 07/01/2013  . Hyperlipidemia 07/01/2013  . Fibromyalgia 07/01/2013  . Degenerative joint disease of cervical spine 07/01/2013  . Barrett's esophagus 07/01/2013  . Chronic pain syndrome 07/01/2013  . Severe major  depression with psychotic features ([place]) 04/15/2011  . Cognitive disorder 04/15/2011  . Closed head injury with brief loss of consciousness ([place]) 08/10/2010    Incision OK.  Hg noted.  Will discontinue NG and start clear liquids  1 Day Post-Op    LOS: 1 day   Matt B. Hassell Done, MD, [facility] [phone] beeper [phone]  02/28/2016 8:17 AM

## 2016-02-29 LAB — BASIC METABOLIC PANEL
Anion gap: 4 — ABNORMAL LOW (ref 5–15)
BUN: <5 mg/dL — ABNORMAL LOW (ref 6–20)
CO2: 29 mmol/L (ref 22–32)
Calcium: 8.2 mg/dL — ABNORMAL LOW (ref 8.9–10.3)
Chloride: 105 mmol/L (ref 101–111)
Creatinine, Ser: 0.61 mg/dL (ref 0.61–1.24)
GFR calc Af Amer: >60 mL/min (ref >60)
GFR calc non Af Amer: >60 mL/min (ref >60)
Glucose, Bld: 215 mg/dL — ABNORMAL HIGH (ref 65–99)
Potassium: 4.5 mmol/L (ref 3.5–5.1)
Sodium: 138 mmol/L (ref 135–145)

## 2016-02-29 LAB — CBC WITH DIFFERENTIAL/PLATELET
Basophils Absolute: 0 10*3/uL (ref 0.0–0.1)
Basophils Relative: 1 %
Eosinophils Absolute: 0.3 10*3/uL (ref 0.0–0.7)
Eosinophils Relative: 5 %
HCT: 28.3 % — ABNORMAL LOW (ref 39.0–52.0)
Hemoglobin: 8.4 g/dL — ABNORMAL LOW (ref 13.0–17.0)
Lymphocytes Relative: 21 %
Lymphs Abs: 1.2 10*3/uL (ref 0.7–4.0)
MCH: 22.2 pg — ABNORMAL LOW (ref 26.0–34.0)
MCHC: 29.7 g/dL — ABNORMAL LOW (ref 30.0–36.0)
MCV: 74.9 fL — ABNORMAL LOW (ref 78.0–100.0)
Monocytes Absolute: 0.6 10*3/uL (ref 0.1–1.0)
Monocytes Relative: 11 %
Neutro Abs: 3.6 10*3/uL (ref 1.7–7.7)
Neutrophils Relative %: 63 %
Platelets: 320 10*3/uL (ref 150–400)
RBC: 3.78 MIL/uL — ABNORMAL LOW (ref 4.22–5.81)
RDW: 16.1 % — ABNORMAL HIGH (ref 11.5–15.5)
WBC: 5.7 10*3/uL (ref 4.0–10.5)

## 2016-02-29 LAB — GLUCOSE, CAPILLARY
Glucose-Capillary: 224 mg/dL — ABNORMAL HIGH (ref 65–99)
Glucose-Capillary: 151 mg/dL — ABNORMAL HIGH (ref 65–99)
Glucose-Capillary: 294 mg/dL — ABNORMAL HIGH (ref 65–99)
Glucose-Capillary: 232 mg/dL — ABNORMAL HIGH (ref 65–99)
Glucose-Capillary: 268 mg/dL — ABNORMAL HIGH (ref 65–99)

## 2016-02-29 MED ORDER — ENSURE ENLIVE PO LIQD
237.0000 mL | Freq: Two times a day (BID) | ORAL | Status: DC
  Administered 2016-02-29 – 2016-03-01 (×4): 237 mL via ORAL

## 2016-02-29 MED ORDER — PANTOPRAZOLE SODIUM 40 MG PO TBEC
40.0000 mg | DELAYED_RELEASE_TABLET | Freq: Every day | ORAL | Status: DC
  Administered 2016-02-29 – 2016-03-01 (×2): 40 mg via ORAL
  Filled 2016-02-29 (×2): qty 1

## 2016-02-29 NOTE — Consult Note (Signed)
   [facility] CM Inpatient Consult   02/29/2016  [place]ollins August 02, 1960 [id number]    Came to bedside to speak with Mr. Helwig to discuss [facility] [employer] program services. He reports his wife normally makes decisions on what services he has. States he would have his wife look over [facility] [employer] brochure and ask her to call if interested. Discussed that since he lived in [place], he would be eligible for telephonic calls only, not home visits, by [facility]. Mr. Siegert currently denies having any community care management needs. Will make inpatient RNCM aware.    Marthenia Rolling, MSN-Ed, RN,BSN [facility] Liaison [phone]

## 2016-02-29 NOTE — Progress Notes (Signed)
Inpatient Diabetes Program Recommendations  AACE/ADA: New Consensus Statement on Inpatient Glycemic Control (2015)  Target Ranges:  Prepandial:   less than 140 mg/dL      Peak postprandial:   less than 180 mg/dL (1-2 hours)      Critically ill patients:  140 - 180 mg/dL   Results for NASRI, BOAKYE (MRN [id number]) as of 02/29/2016 11:13  Ref. Range 02/27/2016 23:55 02/28/2016 03:57 02/28/2016 08:05 02/28/2016 12:12 02/28/2016 16:09 02/28/2016 19:47  Glucose-Capillary Latest Ref Range: 65 - 99 mg/dL 229 (H) 214 (H) 239 (H) 312 (H) 275 (H) 157 (H)   Results for JENCARLOS, NICOLSON (MRN [id number]) as of 02/29/2016 11:13  Ref. Range 02/28/2016 23:34 02/29/2016 03:36 02/29/2016 07:41  Glucose-Capillary Latest Ref Range: 65 - 99 mg/dL 219 (H) 224 (H) 151 (H)     Diabetes history:DM 2  Outpatient DM meds: Januvia 100 mg Daily, Metformin 500 mg BID, Glipizide 20 mg BID  Current Orders: Novolog Sensitive Correction Scale/ SSI (0-9 units) Q4 hours      MD- Please consider increasing Novolog Correction Scale/ SSI to Moderate scale (0-15 units) Q4 hours  (currently ordered as Sensitive scale 0-9 units Q4 hours)     --Will follow patient during hospitalization--  Wyn Quaker RN, MSN, CDE Diabetes Coordinator Inpatient Glycemic Control Team Team Pager: [phone] (8a-5p)

## 2016-02-29 NOTE — Progress Notes (Signed)
Patient ID: Brett [name], male   DOB: [birth year], 58 y.o.   MRN: [id number] [facility] Progress Note:   2 Days Post-Op  Subjective: Mental status is clear Objective: Vital signs in last 24 hours: Temp:  [98.2 F (36.8 C)-98.4 F (36.9 C)] 98.4 F (36.9 C) (02/08 0845) Pulse Rate:  [79-98] 87 (02/08 0845) Resp:  [16-17] 17 (02/08 0845) BP: (93-108)/(59-74) 101/74 (02/08 0845) SpO2:  [95 %-100 %] 95 % (02/08 0845)  Intake/Output from previous day: 02/07 0701 - 02/08 0700 In: 3180 [P.O.:1080; I.V.:2100] Out: 2450 [Urine:2450] Intake/Output this shift: Total I/O In: 500 [P.O.:500] Out: 450 [Urine:450]  Physical Exam: Work of breathing is normal.  Tolerating clears.  Passing flatus  Lab Results:  Results for orders placed or performed during the hospital encounter of 02/27/16 (from the past 48 hour(s))  Glucose, capillary     Status: Abnormal   Collection Time: 02/27/16 11:15 AM  Result Value Ref Range   Glucose-Capillary 188 (H) 65 - 99 mg/dL   Comment 1 Document in Chart   Glucose, capillary     Status: Abnormal   Collection Time: 02/27/16  1:13 PM  Result Value Ref Range   Glucose-Capillary 235 (H) 65 - 99 mg/dL   Comment 1 Notify RN    Comment 2 Document in Chart   CBC     Status: Abnormal   Collection Time: 02/27/16  2:25 PM  Result Value Ref Range   WBC 9.1 4.0 - 10.5 K/uL   RBC 4.01 (L) 4.22 - 5.81 MIL/uL   Hemoglobin 9.3 (L) 13.0 - 17.0 g/dL   HCT 30.3 (L) 39.0 - 52.0 %   MCV 75.6 (L) 78.0 - 100.0 fL   MCH 23.2 (L) 26.0 - 34.0 pg   MCHC 30.7 30.0 - 36.0 g/dL   RDW 16.4 (H) 11.5 - 15.5 %   Platelets 316 150 - 400 K/uL  Creatinine, serum     Status: None   Collection Time: 02/27/16  2:25 PM  Result Value Ref Range   Creatinine, Ser 0.84 0.61 - 1.24 mg/dL   GFR calc non Af Amer >60 >60 mL/min   GFR calc Af Amer >60 >60 mL/min    Comment: (NOTE) The eGFR has been calculated using the CKD EPI equation. This calculation has not been validated in  all clinical situations. eGFR's persistently <60 mL/min signify possible Chronic Kidney Disease.   Glucose, capillary     Status: Abnormal   Collection Time: 02/27/16  4:10 PM  Result Value Ref Range   Glucose-Capillary 253 (H) 65 - 99 mg/dL  Glucose, capillary     Status: Abnormal   Collection Time: 02/27/16  8:36 PM  Result Value Ref Range   Glucose-Capillary 266 (H) 65 - 99 mg/dL  Glucose, capillary     Status: Abnormal   Collection Time: 02/27/16 11:55 PM  Result Value Ref Range   Glucose-Capillary 229 (H) 65 - 99 mg/dL  Glucose, capillary     Status: Abnormal   Collection Time: 02/28/16  3:57 AM  Result Value Ref Range   Glucose-Capillary 214 (H) 65 - 99 mg/dL  CBC     Status: Abnormal   Collection Time: 02/28/16  5:22 AM  Result Value Ref Range   WBC 11.2 (H) 4.0 - 10.5 K/uL   RBC 3.99 (L) 4.22 - 5.81 MIL/uL   Hemoglobin 8.9 (L) 13.0 - 17.0 g/dL   HCT 29.7 (L) 39.0 - 52.0 %   MCV 74.4 (L) 78.0 -  100.0 fL   MCH 22.3 (L) 26.0 - 34.0 pg   MCHC 30.0 30.0 - 36.0 g/dL   RDW 16.2 (H) 11.5 - 15.5 %   Platelets 323 150 - 400 K/uL  Basic metabolic panel     Status: Abnormal   Collection Time: 02/28/16  5:22 AM  Result Value Ref Range   Sodium 138 135 - 145 mmol/L   Potassium 4.7 3.5 - 5.1 mmol/L   Chloride 105 101 - 111 mmol/L   CO2 26 22 - 32 mmol/L   Glucose, Bld 226 (H) 65 - 99 mg/dL   BUN 11 6 - 20 mg/dL   Creatinine, Ser 0.73 0.61 - 1.24 mg/dL   Calcium 8.4 (L) 8.9 - 10.3 mg/dL   GFR calc non Af Amer >60 >60 mL/min   GFR calc Af Amer >60 >60 mL/min    Comment: (NOTE) The eGFR has been calculated using the CKD EPI equation. This calculation has not been validated in all clinical situations. eGFR's persistently <60 mL/min signify possible Chronic Kidney Disease.    Anion gap 7 5 - 15  Glucose, capillary     Status: Abnormal   Collection Time: 02/28/16  8:05 AM  Result Value Ref Range   Glucose-Capillary 239 (H) 65 - 99 mg/dL  Glucose, capillary     Status:  Abnormal   Collection Time: 02/28/16 12:12 PM  Result Value Ref Range   Glucose-Capillary 312 (H) 65 - 99 mg/dL  Glucose, capillary     Status: Abnormal   Collection Time: 02/28/16  4:09 PM  Result Value Ref Range   Glucose-Capillary 275 (H) 65 - 99 mg/dL  Glucose, capillary     Status: Abnormal   Collection Time: 02/28/16  7:47 PM  Result Value Ref Range   Glucose-Capillary 157 (H) 65 - 99 mg/dL  Glucose, capillary     Status: Abnormal   Collection Time: 02/28/16 11:34 PM  Result Value Ref Range   Glucose-Capillary 219 (H) 65 - 99 mg/dL  Glucose, capillary     Status: Abnormal   Collection Time: 02/29/16  3:36 AM  Result Value Ref Range   Glucose-Capillary 224 (H) 65 - 99 mg/dL  Basic metabolic panel     Status: Abnormal   Collection Time: 02/29/16  5:03 AM  Result Value Ref Range   Sodium 138 135 - 145 mmol/L   Potassium 4.5 3.5 - 5.1 mmol/L   Chloride 105 101 - 111 mmol/L   CO2 29 22 - 32 mmol/L   Glucose, Bld 215 (H) 65 - 99 mg/dL   BUN <5 (L) 6 - 20 mg/dL   Creatinine, Ser 0.61 0.61 - 1.24 mg/dL   Calcium 8.2 (L) 8.9 - 10.3 mg/dL   GFR calc non Af Amer >60 >60 mL/min   GFR calc Af Amer >60 >60 mL/min    Comment: (NOTE) The eGFR has been calculated using the CKD EPI equation. This calculation has not been validated in all clinical situations. eGFR's persistently <60 mL/min signify possible Chronic Kidney Disease.    Anion gap 4 (L) 5 - 15  CBC with Differential/Platelet     Status: Abnormal   Collection Time: 02/29/16  5:03 AM  Result Value Ref Range   WBC 5.7 4.0 - 10.5 K/uL   RBC 3.78 (L) 4.22 - 5.81 MIL/uL   Hemoglobin 8.4 (L) 13.0 - 17.0 g/dL   HCT 28.3 (L) 39.0 - 52.0 %   MCV 74.9 (L) 78.0 - 100.0 fL   MCH 22.2 (  L) 26.0 - 34.0 pg   MCHC 29.7 (L) 30.0 - 36.0 g/dL   RDW 16.1 (H) 11.5 - 15.5 %   Platelets 320 150 - 400 K/uL   Neutrophils Relative % 63 %   Neutro Abs 3.6 1.7 - 7.7 K/uL   Lymphocytes Relative 21 %   Lymphs Abs 1.2 0.7 - 4.0 K/uL   Monocytes  Relative 11 %   Monocytes Absolute 0.6 0.1 - 1.0 K/uL   Eosinophils Relative 5 %   Eosinophils Absolute 0.3 0.0 - 0.7 K/uL   Basophils Relative 1 %   Basophils Absolute 0.0 0.0 - 0.1 K/uL  Glucose, capillary     Status: Abnormal   Collection Time: 02/29/16  7:41 AM  Result Value Ref Range   Glucose-Capillary 151 (H) 65 - 99 mg/dL    Radiology/Results: No results found.  Anti-infectives: Anti-infectives    Start     Dose/Rate Route Frequency Ordered Stop   02/27/16 2200  cefoTEtan (CEFOTAN) 2 g in dextrose 5 % 50 mL IVPB     2 g 100 mL/hr over 30 Minutes Intravenous Every 12 hours 02/27/16 1408 02/27/16 2236   02/27/16 0815  cefoTEtan in Dextrose 5% (CEFOTAN) IVPB 2 g     2 g Intravenous On call to O.R. 02/27/16 0804 02/27/16 1010      Assessment/Plan: Problem List: Patient Active Problem List   Diagnosis Date Noted  . 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 Carcinoma ([place]) 01/17/2016  . Malnutrition of moderate degree 12/22/2015  . GI bleed 12/21/2015  . Hypotension 12/07/2015  . Shortness of breath 11/30/2015  . Protein-calorie malnutrition, severe 11/29/2015  . Iron deficiency anemia due to chronic blood loss 11/28/2015  . Panic disorder 11/24/2015  . S/P lobectomy of lung 10/18/2015  . Encounter for antineoplastic chemotherapy 10/05/2015  . Primary lung adenocarcinoma ([place]) 08/25/2015  . Osteoarthritis of right thumb 10/21/2014  . Dupuytren's contracture of both hands 04/08/2014  . Healthcare maintenance 10/08/2013  . Post traumatic stress disorder 07/02/2013  . Type 2 diabetes mellitus with vascular disease ([place]) 07/01/2013  . Peripheral vascular occlusive disease ([place]) 07/01/2013  . 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 Erectile dysfunction associated with type 2 diabetes mellitus ([place]) 07/01/2013  . Hyperlipidemia 07/01/2013  . Fibromyalgia 07/01/2013  . Degenerative joint disease  of cervical spine 07/01/2013  . Barrett's esophagus 07/01/2013  . Chronic pain syndrome 07/01/2013  . Severe major depression with psychotic features ([place]) 04/15/2011  . Cognitive disorder 04/15/2011  . Closed head injury with brief loss of consciousness ([place]) 08/10/2010    Advance to full liquid diet.  Decrease IV fluids.   2 Days Post-Op    LOS: 2 days   Matt B. Hassell Done, MD, [facility] [phone] beeper [phone]  02/29/2016 9:03 AM

## 2016-02-29 NOTE — Progress Notes (Signed)
Patient's IV slightly edematous. Removed. MD called at pt's request  To ask to  Not have IV restarted . Message left with office nurse

## 2016-03-01 LAB — GLUCOSE, CAPILLARY
Glucose-Capillary: 127 mg/dL — ABNORMAL HIGH (ref 65–99)
Glucose-Capillary: 147 mg/dL — ABNORMAL HIGH (ref 65–99)
Glucose-Capillary: 148 mg/dL — ABNORMAL HIGH (ref 65–99)
Glucose-Capillary: 170 mg/dL — ABNORMAL HIGH (ref 65–99)
Glucose-Capillary: 214 mg/dL — ABNORMAL HIGH (ref 65–99)
Glucose-Capillary: 218 mg/dL — ABNORMAL HIGH (ref 65–99)
Glucose-Capillary: 326 mg/dL — ABNORMAL HIGH (ref 65–99)
Glucose-Capillary: 330 mg/dL — ABNORMAL HIGH (ref 65–99)

## 2016-03-01 MED ORDER — INSULIN ASPART 100 UNIT/ML ~~LOC~~ SOLN
0.0000 [IU] | SUBCUTANEOUS | Status: DC
  Administered 2016-03-01: 5 [IU] via SUBCUTANEOUS
  Administered 2016-03-02 (×3): 2 [IU] via SUBCUTANEOUS

## 2016-03-01 MED ORDER — INSULIN GLARGINE 100 UNIT/ML ~~LOC~~ SOLN
10.0000 [IU] | Freq: Every day | SUBCUTANEOUS | Status: DC
Start: 2016-03-01 — End: 2016-03-02
  Administered 2016-03-01: 10 [IU] via SUBCUTANEOUS
  Filled 2016-03-01 (×2): qty 0.1

## 2016-03-01 NOTE — Progress Notes (Signed)
Patient ID: Brett [name], male   DOB: 04/19/1960, 56 y.o.   MRN: [id number] [facility] Progress Note:   3 Days Post-Op  Subjective: Mental status is clear.  Feeling better.   Objective: Vital signs in last 24 hours: Temp:  [98.1 F (36.7 C)-98.9 F (37.2 C)] 98.1 F (36.7 C) (02/09 0631) Pulse Rate:  [74-93] 93 (02/09 0631) Resp:  [17-18] 18 (02/09 0631) BP: (104-121)/(65-75) 115/73 (02/09 0631) SpO2:  [97 %-98 %] 98 % (02/09 0631)  Intake/Output from previous day: 02/08 0701 - 02/09 0700 In: 2360 [P.O.:1800; I.V.:560] Out: 3725 [WUJWJ:1914] Intake/Output this shift: Total I/O In: -  Out: 120 [Urine:120]  Physical Exam: Work of breathing is normal.  Drinking OK.  Incision OK   Lab Results:  Results for orders placed or performed during the hospital encounter of 02/27/16 (from the past 48 hour(s))  Glucose, capillary     Status: Abnormal   Collection Time: 02/28/16 12:12 PM  Result Value Ref Range   Glucose-Capillary 312 (H) 65 - 99 mg/dL  Glucose, capillary     Status: Abnormal   Collection Time: 02/28/16  4:09 PM  Result Value Ref Range   Glucose-Capillary 275 (H) 65 - 99 mg/dL  Glucose, capillary     Status: Abnormal   Collection Time: 02/28/16  7:47 PM  Result Value Ref Range   Glucose-Capillary 157 (H) 65 - 99 mg/dL  Glucose, capillary     Status: Abnormal   Collection Time: 02/28/16 11:34 PM  Result Value Ref Range   Glucose-Capillary 219 (H) 65 - 99 mg/dL  Glucose, capillary     Status: Abnormal   Collection Time: 02/29/16  3:36 AM  Result Value Ref Range   Glucose-Capillary 224 (H) 65 - 99 mg/dL  Basic metabolic panel     Status: Abnormal   Collection Time: 02/29/16  5:03 AM  Result Value Ref Range   Sodium 138 135 - 145 mmol/L   Potassium 4.5 3.5 - 5.1 mmol/L   Chloride 105 101 - 111 mmol/L   CO2 29 22 - 32 mmol/L   Glucose, Bld 215 (H) 65 - 99 mg/dL   BUN <5 (L) 6 - 20 mg/dL   Creatinine, Ser 0.61 0.61 - 1.24 mg/dL   Calcium 8.2 (L) 8.9  - 10.3 mg/dL   GFR calc non Af Amer >60 >60 mL/min   GFR calc Af Amer >60 >60 mL/min    Comment: (NOTE) The eGFR has been calculated using the CKD EPI equation. This calculation has not been validated in all clinical situations. eGFR's persistently <60 mL/min signify possible Chronic Kidney Disease.    Anion gap 4 (L) 5 - 15  CBC with Differential/Platelet     Status: Abnormal   Collection Time: 02/29/16  5:03 AM  Result Value Ref Range   WBC 5.7 4.0 - 10.5 K/uL   RBC 3.78 (L) 4.22 - 5.81 MIL/uL   Hemoglobin 8.4 (L) 13.0 - 17.0 g/dL   HCT 28.3 (L) 39.0 - 52.0 %   MCV 74.9 (L) 78.0 - 100.0 fL   MCH 22.2 (L) 26.0 - 34.0 pg   MCHC 29.7 (L) 30.0 - 36.0 g/dL   RDW 16.1 (H) 11.5 - 15.5 %   Platelets 320 150 - 400 K/uL   Neutrophils Relative % 63 %   Neutro Abs 3.6 1.7 - 7.7 K/uL   Lymphocytes Relative 21 %   Lymphs Abs 1.2 0.7 - 4.0 K/uL   Monocytes Relative 11 %  Monocytes Absolute 0.6 0.1 - 1.0 K/uL   Eosinophils Relative 5 %   Eosinophils Absolute 0.3 0.0 - 0.7 K/uL   Basophils Relative 1 %   Basophils Absolute 0.0 0.0 - 0.1 K/uL  Glucose, capillary     Status: Abnormal   Collection Time: 02/29/16  7:41 AM  Result Value Ref Range   Glucose-Capillary 151 (H) 65 - 99 mg/dL  Glucose, capillary     Status: Abnormal   Collection Time: 02/29/16 12:09 PM  Result Value Ref Range   Glucose-Capillary 294 (H) 65 - 99 mg/dL  Glucose, capillary     Status: Abnormal   Collection Time: 02/29/16  4:05 PM  Result Value Ref Range   Glucose-Capillary 232 (H) 65 - 99 mg/dL  Glucose, capillary     Status: Abnormal   Collection Time: 02/29/16  8:03 PM  Result Value Ref Range   Glucose-Capillary 268 (H) 65 - 99 mg/dL  Glucose, capillary     Status: Abnormal   Collection Time: 03/01/16 12:04 AM  Result Value Ref Range   Glucose-Capillary 127 (H) 65 - 99 mg/dL  Glucose, capillary     Status: Abnormal   Collection Time: 03/01/16  4:00 AM  Result Value Ref Range   Glucose-Capillary 148 (H)  65 - 99 mg/dL  Glucose, capillary     Status: Abnormal   Collection Time: 03/01/16  7:51 AM  Result Value Ref Range   Glucose-Capillary 170 (H) 65 - 99 mg/dL    Radiology/Results: No results found.  Anti-infectives: Anti-infectives    Start     Dose/Rate Route Frequency Ordered Stop   02/27/16 2200  cefoTEtan (CEFOTAN) 2 g in dextrose 5 % 50 mL IVPB     2 g 100 mL/hr over 30 Minutes Intravenous Every 12 hours 02/27/16 1408 02/27/16 2236   02/27/16 0815  cefoTEtan in Dextrose 5% (CEFOTAN) IVPB 2 g     2 g Intravenous On call to O.R. 02/27/16 0804 02/27/16 1010      Assessment/Plan: Problem List: Patient Active Problem List   Diagnosis Date Noted  . 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 Carcinoma ([place]) 01/17/2016  . Malnutrition of moderate degree 12/22/2015  . GI bleed 12/21/2015  . Hypotension 12/07/2015  . Shortness of breath 11/30/2015  . Protein-calorie malnutrition, severe 11/29/2015  . Iron deficiency anemia due to chronic blood loss 11/28/2015  . Panic disorder 11/24/2015  . S/P lobectomy of lung 10/18/2015  . Encounter for antineoplastic chemotherapy 10/05/2015  . Primary lung adenocarcinoma ([place]) 08/25/2015  . Osteoarthritis of right thumb 10/21/2014  . Dupuytren's contracture of both hands 04/08/2014  . Healthcare maintenance 10/08/2013  . Post traumatic stress disorder 07/02/2013  . Type 2 diabetes mellitus with vascular disease ([place]) 07/01/2013  . Peripheral vascular occlusive disease ([place]) 07/01/2013  . 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 Erectile dysfunction associated with type 2 diabetes mellitus ([place]) 07/01/2013  . Hyperlipidemia 07/01/2013  . Fibromyalgia 07/01/2013  . Degenerative joint disease of cervical spine 07/01/2013  . Barrett's esophagus 07/01/2013  . Chronic pain syndrome 07/01/2013  . Severe major depression with psychotic features ([place]) 04/15/2011  .  Cognitive disorder 04/15/2011  . Closed head injury with brief loss of consciousness ([place]) 08/10/2010    Taking full liquids OK.  Will advance to solid diabetic diet.  Check labs in AM and hopeful discharge then.  Path report pending.  3 Days Post-Op    LOS: 3  days   Matt B. Hassell Done, MD, [facility] [phone] beeper [phone]  03/01/2016 8:47 AM

## 2016-03-02 LAB — CBC WITH DIFFERENTIAL/PLATELET
Basophils Absolute: 0 10*3/uL (ref 0.0–0.1)
Basophils Relative: 0 %
Eosinophils Absolute: 0.6 10*3/uL (ref 0.0–0.7)
Eosinophils Relative: 7 %
HCT: 31.1 % — ABNORMAL LOW (ref 39.0–52.0)
Hemoglobin: 9.4 g/dL — ABNORMAL LOW (ref 13.0–17.0)
Lymphocytes Relative: 17 %
Lymphs Abs: 1.4 10*3/uL (ref 0.7–4.0)
MCH: 22.3 pg — ABNORMAL LOW (ref 26.0–34.0)
MCHC: 30.2 g/dL (ref 30.0–36.0)
MCV: 73.7 fL — ABNORMAL LOW (ref 78.0–100.0)
Monocytes Absolute: 0.7 10*3/uL (ref 0.1–1.0)
Monocytes Relative: 9 %
Neutro Abs: 5.5 10*3/uL (ref 1.7–7.7)
Neutrophils Relative %: 67 %
Platelets: 395 10*3/uL (ref 150–400)
RBC: 4.22 MIL/uL (ref 4.22–5.81)
RDW: 16.1 % — ABNORMAL HIGH (ref 11.5–15.5)
WBC: 8.2 10*3/uL (ref 4.0–10.5)

## 2016-03-02 LAB — BASIC METABOLIC PANEL
Anion gap: 7 (ref 5–15)
BUN: 7 mg/dL (ref 6–20)
CO2: 30 mmol/L (ref 22–32)
Calcium: 9 mg/dL (ref 8.9–10.3)
Chloride: 102 mmol/L (ref 101–111)
Creatinine, Ser: 0.66 mg/dL (ref 0.61–1.24)
GFR calc Af Amer: >60 mL/min (ref >60)
GFR calc non Af Amer: >60 mL/min (ref >60)
Glucose, Bld: 132 mg/dL — ABNORMAL HIGH (ref 65–99)
Potassium: 3.7 mmol/L (ref 3.5–5.1)
Sodium: 139 mmol/L (ref 135–145)

## 2016-03-02 LAB — GLUCOSE, CAPILLARY
Glucose-Capillary: 121 mg/dL — ABNORMAL HIGH (ref 65–99)
Glucose-Capillary: 130 mg/dL — ABNORMAL HIGH (ref 65–99)
Glucose-Capillary: 271 mg/dL — ABNORMAL HIGH (ref 65–99)

## 2016-03-02 MED ORDER — HYDROCODONE-ACETAMINOPHEN 5-325 MG PO TABS: 1.0000 | ORAL_TABLET | ORAL | 0 refills | Status: DC | PRN

## 2016-03-02 NOTE — Progress Notes (Signed)
4 Days Post-Op  Subjective: No complaints. Tolerating diet. Having bm's  Objective: Vital signs in last 24 hours: Temp:  [98.2 F (36.8 C)-98.5 F (36.9 C)] 98.3 F (36.8 C) (02/10 0539) Pulse Rate:  [73-89] 89 (02/10 0539) Resp:  [16-17] 16 (02/10 0539) BP: (92-106)/(65-71) 92/65 (02/10 0539) SpO2:  [97 %] 97 % (02/10 0539) Last BM Date: 02/26/16  Intake/Output from previous day: 02/09 0701 - 02/10 0700 In: 1590 [P.O.:1590] Out: 2880 [Urine:2880] Intake/Output this shift: No intake/output data recorded.  Resp: clear to auscultation bilaterally Cardio: regular rate and rhythm GI: soft, nontender. incision looks good. good bs  Lab Results:   Recent Labs  02/29/16 0503 03/02/16 0500  WBC 5.7 8.2  HGB 8.4* 9.4*  HCT 28.3* 31.1*  PLT 320 395   BMET  Recent Labs  02/29/16 0503 03/02/16 0500  NA 138 139  K 4.5 3.7  CL 105 102  CO2 29 30  GLUCOSE 215* 132*  BUN <5* 7  CREATININE 0.61 0.66  CALCIUM 8.2* 9.0   PT/INR No results for input(s): LABPROT, INR in the last 72 hours. ABG No results for input(s): PHART, HCO3 in the last 72 hours.  Invalid input(s): PCO2, PO2  Studies/Results: No results found.  Anti-infectives: Anti-infectives    Start     Dose/Rate Route Frequency Ordered Stop   02/27/16 2200  cefoTEtan (CEFOTAN) 2 g in dextrose 5 % 50 mL IVPB     2 g 100 mL/hr over 30 Minutes Intravenous Every 12 hours 02/27/16 1408 02/27/16 2236   02/27/16 0815  cefoTEtan in Dextrose 5% (CEFOTAN) IVPB 2 g     2 g Intravenous On call to O.R. 02/27/16 0804 02/27/16 1010      Assessment/Plan: s/p Procedure(s): LAPAROSCOPY, LAPAROTOMY  WITH TWO SMALL BOWEL RESECTION (N/A) Advance diet Discharge  LOS: 4 days    TOTH III,Wallace Cogliano S 03/02/2016

## 2016-03-02 NOTE — Care Management Note (Signed)
Case Management Note  Patient Details  Name: Brett [name] MRN: [id number] Date of Birth: 07-19-1960  Subjective/Objective:   Laparoscopy, Laparotomy with two Small Bowel Resection                  Action/Plan: Discharge Planning: AVS reviewed: NCM spoke to pt and lives at home with wife. Wife takes him to MD appts. No problems getting meds.   PCP [name] MD  Expected Discharge Date:  03/02/16               Expected Discharge Plan:  Home/Self Care  In-House Referral:  NA  Discharge planning Services  CM Consult  Post Acute Care Choice:  NA Choice offered to:  NA  DME Arranged:  N/A DME Agency:  NA  HH Arranged:  NA HH Agency:  NA  Status of Service:  Completed, signed off  If discussed at [place] of Stay Meetings, dates discussed:    Additional Comments:  Erenest Rasher, RN 03/02/2016, 11:55 AM

## 2016-03-02 NOTE — Progress Notes (Signed)
Pt discharged to home. DC instructions given with wife and dtr at bedside. No concerns voiced. prescription x 1 given for pain med. Pt and wife reminded of importance of following up with MD. Both voiced understanding. Left unit in wheelchair pushed by nurse tech. Left in good condition. VWilliams,rn.

## 2016-03-02 NOTE — Care Management Important Message (Signed)
Important Message  Patient Details  Name: Brett [name] MRN: [id number] Date of Birth: 1960-08-07   Medicare Important Message Given:  Yes    Erenest Rasher, RN 03/02/2016, 11:36 AM

## 2016-03-12 ENCOUNTER — Other Ambulatory Visit: Payer: Self-pay | Admitting: Internal Medicine

## 2016-03-12 DIAGNOSIS — E1159 Type 2 diabetes mellitus with other circulatory complications: Secondary | ICD-10-CM

## 2016-03-12 NOTE — Discharge Summary (Signed)
Physician Discharge Summary  Patient ID: Brett [name] MRN: [id number] DOB/AGE: [birth year] 56 y.o.  Admit date: 02/27/2016 Discharge date:  Admission Diagnoses:  Cancer involving the small bowel  Discharge Diagnoses:  Same in two places  Active Problems:   Small bowel cancer [facility])   Surgery:  Laparoscopy and two separate small bowel resections  Discharged Condition: improved  Hospital Course:   Had surgery.  NG at first and then started on clear liquids.  Took liquids slowly until ready for discharge  Consults: none  Significant Diagnostic Studies: path    Discharge Exam: Blood pressure 92/65, pulse 89, temperature 98.3 F (36.8 C), temperature source Oral, resp. rate 16, height '5\' 7"'$  (1.702 m), weight 60.3 kg (133 lb), SpO2 97 %. Incisions healing OK  Disposition: 01-Home or Self Care  Discharge Instructions    Call MD for:  difficulty breathing, headache or visual disturbances    Complete by:  As directed    Call MD for:  extreme fatigue    Complete by:  As directed    Call MD for:  hives    Complete by:  As directed    Call MD for:  persistant dizziness or light-headedness    Complete by:  As directed    Call MD for:  persistant nausea and vomiting    Complete by:  As directed    Call MD for:  redness, tenderness, or signs of infection (pain, swelling, redness, odor or green/yellow discharge around incision site)    Complete by:  As directed    Call MD for:  severe uncontrolled pain    Complete by:  As directed    Call MD for:  temperature >100.4    Complete by:  As directed    Diet - low sodium heart healthy    Complete by:  As directed    Discharge instructions    Complete by:  As directed    May shower. Diet as tolerated. No heavy lifting   Increase activity slowly    Complete by:  As directed    No wound care    Complete by:  As directed      Allergies as of 03/02/2016      Reactions   Gabapentin Other (See Comments)   suicidal thoughts   Celebrex  [celecoxib] Other (See Comments)   Nightmares      Medication List    TAKE these medications   aspirin EC 81 MG tablet Take 81 mg by mouth every other day.   atorvastatin 40 MG tablet Commonly known as:  LIPITOR Take 1 tablet (40 mg total) by mouth daily.   ferrous sulfate 325 (65 FE) MG tablet Take 1 tablet (325 mg total) by mouth 2 (two) times daily with a meal.   glipiZIDE 10 MG tablet Commonly known as:  GLUCOTROL Take 2 tablets (20 mg total) by mouth 2 (two) times daily before a meal.   HYDROcodone-acetaminophen 10-325 MG tablet Commonly known as:  NORCO Take 1 tablet by mouth every 8 (eight) hours as needed for severe pain. What changed:  Another medication with the same name was added. Make sure you understand how and when to take each.   HYDROcodone-acetaminophen 5-325 MG tablet Commonly known as:  NORCO/VICODIN Take 1-2 tablets by mouth every 4 (four) hours as needed for moderate pain or severe pain. What changed:  You were already taking a medication with the same name, and this prescription was added. Make sure you understand how and when  to take each.   lisinopril 5 MG tablet Commonly known as:  PRINIVIL,ZESTRIL Take 0.5 tablets (2.5 mg total) by mouth daily.   metFORMIN 500 MG tablet Commonly known as:  GLUCOPHAGE Take 1 tablet (500 mg total) by mouth 2 (two) times daily with a meal.   pantoprazole 40 MG tablet Commonly known as:  PROTONIX Take 1 tablet (40 mg total) by mouth daily.   prochlorperazine 10 MG tablet Commonly known as:  COMPAZINE Take 1 tablet (10 mg total) by mouth every 6 (six) hours as needed for nausea or vomiting.   sitaGLIPtin 100 MG tablet Commonly known as:  JANUVIA Take 1 tablet (100 mg total) by mouth daily.      Follow-up Information    Christropher Gintz B, MD Follow up in 2 week(s).   Specialty:  General Surgery Contact information: [address] [place] [address] [phone]            Signed: Pedro Earls 03/12/2016, 7:36 AM

## 2016-03-13 ENCOUNTER — Encounter: Payer: Self-pay | Admitting: *Deleted

## 2016-03-13 ENCOUNTER — Other Ambulatory Visit: Payer: Self-pay | Admitting: *Deleted

## 2016-03-13 ENCOUNTER — Telehealth: Payer: Self-pay | Admitting: Internal Medicine

## 2016-03-13 DIAGNOSIS — C3411 Malignant neoplasm of upper lobe, right bronchus or lung: Secondary | ICD-10-CM

## 2016-03-13 NOTE — Progress Notes (Signed)
Oncology Nurse Navigator Documentation  Oncology Nurse Navigator Flowsheets 03/13/2016  Navigator Location [facility]  Navigator Encounter Type Other/Dr. Julien Nordmann was updated on pathology for Brett [name].  He requested tissue to be send for PDL 1 and MSI testing.  I requested from [facility] pathology.  Dr. Julien Nordmann would like to see patient in 1-2 weeks for a follow up.  I updated scheduling on request.   Barriers/Navigation Needs Coordination of Care  Interventions Coordination of Care  Coordination of Care Appts;Other  Acuity Level 2  Time Spent with Patient 30

## 2016-03-13 NOTE — Telephone Encounter (Signed)
lvm to inform pt of 3/8 appt at 1145 am per LOS

## 2016-03-14 ENCOUNTER — Telehealth: Payer: Self-pay | Admitting: Internal Medicine

## 2016-03-14 NOTE — Telephone Encounter (Signed)
APT. REMINDER CALL, LMTCB °

## 2016-03-15 ENCOUNTER — Encounter: Payer: Self-pay | Admitting: Internal Medicine

## 2016-03-15 ENCOUNTER — Ambulatory Visit (INDEPENDENT_AMBULATORY_CARE_PROVIDER_SITE_OTHER): Payer: Medicare Other | Admitting: Internal Medicine

## 2016-03-15 DIAGNOSIS — E1159 Type 2 diabetes mellitus with other circulatory complications: Secondary | ICD-10-CM

## 2016-03-15 DIAGNOSIS — G894 Chronic pain syndrome: Secondary | ICD-10-CM | POA: Diagnosis not present

## 2016-03-15 DIAGNOSIS — C171 Malignant neoplasm of jejunum: Secondary | ICD-10-CM | POA: Diagnosis not present

## 2016-03-15 DIAGNOSIS — Z9889 Other specified postprocedural states: Secondary | ICD-10-CM | POA: Diagnosis not present

## 2016-03-15 DIAGNOSIS — Z5189 Encounter for other specified aftercare: Secondary | ICD-10-CM | POA: Diagnosis not present

## 2016-03-15 DIAGNOSIS — Z23 Encounter for immunization: Secondary | ICD-10-CM

## 2016-03-15 DIAGNOSIS — E1151 Type 2 diabetes mellitus with diabetic peripheral angiopathy without gangrene: Secondary | ICD-10-CM | POA: Diagnosis not present

## 2016-03-15 DIAGNOSIS — Z79891 Long term (current) use of opiate analgesic: Secondary | ICD-10-CM | POA: Diagnosis not present

## 2016-03-15 DIAGNOSIS — E7849 Other hyperlipidemia: Secondary | ICD-10-CM

## 2016-03-15 DIAGNOSIS — E785 Hyperlipidemia, unspecified: Secondary | ICD-10-CM | POA: Diagnosis not present

## 2016-03-15 DIAGNOSIS — Z7984 Long term (current) use of oral hypoglycemic drugs: Secondary | ICD-10-CM | POA: Diagnosis not present

## 2016-03-15 DIAGNOSIS — I739 Peripheral vascular disease, unspecified: Secondary | ICD-10-CM

## 2016-03-15 NOTE — Assessment & Plan Note (Signed)
Assessment  He has had some deterioration in his diabetic control from the last visit. This is understandable given that he was diagnosed with a jejunal cancer and underwent resection. His hemoglobin A1c 3 weeks ago was 8.5. With regards to his peripheral vascular disease his right lower extremity pain was unchanged.  Plan  We will continue the glipizide 20 mg by mouth twice daily, metformin 500 mg by mouth twice daily, and Januvia 100 mg by mouth daily. We will reassess his diabetic control with a hemoglobin A1c at the follow-up visit. We are continuing aggressive management of his diabetes and hyperlipidemia with regards to risk factor modification for his peripheral vascular disease.

## 2016-03-15 NOTE — Assessment & Plan Note (Signed)
Assessment  He has developed no new myalgias since starting the atorvastatin 40 mg by mouth daily.  Plan  We will continue with this high intensity statin and reassess for intolerances at the follow-up visit.

## 2016-03-15 NOTE — Addendum Note (Signed)
Addended by: Marcelino Duster on: 03/15/2016 02:29 PM   Modules accepted: Orders

## 2016-03-15 NOTE — Progress Notes (Signed)
   Subjective:    Patient ID: Brett [name], male    DOB: November 22, 1960, 56 y.o.   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 Please see the A&P for the status of the pt's chronic medical problems.  Review of Systems  Constitutional: Negative for appetite change and fever.  Respiratory: Negative for cough, chest tightness, shortness of breath and wheezing.   Cardiovascular: Negative for chest pain, palpitations and leg swelling.  Gastrointestinal: Positive for abdominal pain and nausea. Negative for abdominal distention, anal bleeding, blood in stool, constipation, diarrhea and vomiting.       Post-op pain is minimal.  Occasional nausea with no vomiting.  Musculoskeletal: Positive for arthralgias, back pain, gait problem, myalgias, neck pain and neck stiffness. Negative for joint swelling.       All chronic and unchanged from baseline.  Skin: Positive for wound. Negative for pallor and rash.       Abdominal incisions are all clean and dry.  Neurological: Positive for headaches. Negative for dizziness.  Psychiatric/Behavioral: Positive for decreased concentration. Negative for agitation, behavioral problems and dysphoric mood. The patient is not nervous/anxious.       Objective:   Physical Exam  Constitutional: He is oriented to person, place, and time. He appears well-developed. No distress.  HENT:  Head: Normocephalic.  Eyes: Conjunctivae are normal. Right eye exhibits no discharge. Left eye exhibits no discharge. No scleral icterus.  Abdominal: Soft. Bowel sounds are normal. He exhibits no distension. There is no tenderness. There is no rebound and no guarding.  Musculoskeletal: Normal range of motion. He exhibits no edema or tenderness.  Neurological: He is alert and oriented to person, place, and time. He exhibits normal muscle tone.  Skin: Skin is warm and dry. No rash  noted. He is not diaphoretic. No erythema. No pallor.  Psychiatric: He has a normal mood and affect. His behavior is normal. Judgment and thought content normal.  Nursing note and vitals reviewed.     Assessment & Plan:   Please see problem oriented charting.

## 2016-03-15 NOTE — Assessment & Plan Note (Signed)
Assessment  He is remaining functional in terms of activities of daily living and moving about both within the house and outside of the house on his regimen of hydrocodone-acetaminophen 10-325 mg 1 tablet every 6 hours as needed dispense #90 per month.  Plan  We will continue the hydrocodone-acetaminophen 10-325 mg 1 tablet every 6 hours as needed for pain dispense #90 per month and reassess his functional status at the follow-up visit.

## 2016-03-15 NOTE — Assessment & Plan Note (Signed)
Assessment  On 02/27/2016 Brett [name] underwent resection of a 8.5 cm proximal jejunal and 6.5 distal jejunal mass, both of which were poorly differentiated carcinoma of a high-grade involving the mucosa, muscularis propria, and subserosa. There were 6/10 lymph nodes that were positive for tumor. The tumor was also positive for CK 7 and was negative for CK 20, TTF-1, Napsin A, CK 5/6, p63, and S100. He has an appointment in the [facility] in approximately 1-2 weeks. To me it is unclear whether this is a primary jejunal tumor metastatic to the jejunum and lung, primary lung tumor metastatic to the jejunum, or a primary jejunal tumor and primary lung tumor. I am hopeful that oncology will be if the sort this out and provide these answers to the patient and Brett family as I was unable to provide them with this specific information. Symptomatically, he is recovering well from surgery and is anxious to hear more about the next steps.  Plan  He will follow-up in the [facility] for further information and a decision as to what further therapy may be required. I had a long conversation with he and Brett [name] about the diagnosis, the fact that I'm unsure about the specific origin of the tumor, and mostly about how they both were handling the cancer diagnosis as well as how their family was doing. They have yet to tell other family members about this second cancer. Brett [name] was tearful for some of our conversation and I believe this was therapeutic for her as she states she's had to be the pillar of the family through all of this. Brett [name] was more interactive, positive, assured of himself, and brighter than I have ever seen him. I complemented him on how well he had done through both of these cancer diagnoses.

## 2016-03-15 NOTE — Assessment & Plan Note (Signed)
Assessment  He continues to have some pain in his left lower extremity which is chronic and unchanged.  Plan  We are continuing aggressive risk factor modification by aggressively treating his diabetes as well as his hyperlipidemia with atorvastatin 40 mg by mouth daily.

## 2016-03-15 NOTE — Patient Instructions (Signed)
It was wonderful to see you again.  I am so impressed with how you have gotten through the last 1 year.  1) Keep doing what you are doing with your medications.  2) Follow-up with the [facility] as scheduled to get more information on the small bowel cancer and what further therapy may be necessary.  3) We gave you the pneumonia shot today.  I will see you in 3 months, sooner if necessary.

## 2016-03-19 ENCOUNTER — Encounter (HOSPITAL_COMMUNITY): Payer: Self-pay

## 2016-03-28 ENCOUNTER — Telehealth: Payer: Self-pay | Admitting: Internal Medicine

## 2016-03-28 ENCOUNTER — Other Ambulatory Visit: Payer: Self-pay | Admitting: Internal Medicine

## 2016-03-28 ENCOUNTER — Ambulatory Visit (HOSPITAL_BASED_OUTPATIENT_CLINIC_OR_DEPARTMENT_OTHER): Payer: Medicare Other | Admitting: Internal Medicine

## 2016-03-28 ENCOUNTER — Encounter: Payer: Self-pay | Admitting: Internal Medicine

## 2016-03-28 VITALS — BP 120/87 | HR 89 | Temp 99.1°F | Resp 18 | Wt 133.0 lb

## 2016-03-28 DIAGNOSIS — R52 Pain, unspecified: Secondary | ICD-10-CM

## 2016-03-28 DIAGNOSIS — Z7189 Other specified counseling: Secondary | ICD-10-CM

## 2016-03-28 DIAGNOSIS — C3491 Malignant neoplasm of unspecified part of right bronchus or lung: Secondary | ICD-10-CM | POA: Diagnosis not present

## 2016-03-28 DIAGNOSIS — C784 Secondary malignant neoplasm of small intestine: Secondary | ICD-10-CM

## 2016-03-28 DIAGNOSIS — E119 Type 2 diabetes mellitus without complications: Secondary | ICD-10-CM

## 2016-03-28 DIAGNOSIS — C3411 Malignant neoplasm of upper lobe, right bronchus or lung: Secondary | ICD-10-CM

## 2016-03-28 DIAGNOSIS — C771 Secondary and unspecified malignant neoplasm of intrathoracic lymph nodes: Secondary | ICD-10-CM

## 2016-03-28 DIAGNOSIS — D509 Iron deficiency anemia, unspecified: Secondary | ICD-10-CM

## 2016-03-28 DIAGNOSIS — Z5111 Encounter for antineoplastic chemotherapy: Secondary | ICD-10-CM

## 2016-03-28 DIAGNOSIS — C171 Malignant neoplasm of jejunum: Secondary | ICD-10-CM

## 2016-03-28 HISTORY — DX: Other specified counseling: Z71.89

## 2016-03-28 MED ORDER — DEXAMETHASONE 4 MG PO TABS: 4.0000 mg | ORAL_TABLET | Freq: Two times a day (BID) | ORAL | 1 refills | Status: DC

## 2016-03-28 MED ORDER — FOLIC ACID 1 MG PO TABS: 1.0000 mg | ORAL_TABLET | Freq: Every day | ORAL | 4 refills | Status: DC

## 2016-03-28 MED ORDER — LIDOCAINE-PRILOCAINE 2.5-2.5 % EX CREA: 1.0000 "application " | TOPICAL_CREAM | CUTANEOUS | 0 refills | Status: DC | PRN

## 2016-03-28 MED ORDER — PROCHLORPERAZINE MALEATE 10 MG PO TABS: 10.0000 mg | ORAL_TABLET | Freq: Four times a day (QID) | ORAL | 0 refills | Status: DC | PRN

## 2016-03-28 MED ORDER — CYANOCOBALAMIN 1000 MCG/ML IJ SOLN
1000.0000 ug | Freq: Once | INTRAMUSCULAR | Status: AC
  Administered 2016-03-28: 1000 ug via INTRAMUSCULAR

## 2016-03-28 NOTE — Progress Notes (Signed)
[facility] [phone]   [phone]  OFFICE PROGRESS NOTE  [name], MD [address] [place] [address]  DIAGNOSIS: Stage IV (T1b, N0, M1b) non-small cell lung cancer, adenocarcinoma presented with right upper lobe lung nodule and recent metastasis to the small intestine. This was initially diagnosed in September 2017.  Genomic Alterations Identified? ERBB2 amplification - equivocal? CDKN2A p16INK4a E88* and p14ARF Y774J SMARCA4 splice site [id number]>OB SPTA1 E2022* TOP2A amplification TP53 A159P Additional Findings? Microsatellite status MS-Stable Tumor Mutation Burden TMB-Intermediate; 18 Muts/Mb Additional Disease-relevant Genes with No Reportable Alterations Identified? EGFR KRAS ALK BRAF MET RET ROS1   PRIOR THERAPY:  1) Status post right VATS with right upper lobectomy and mediastinal lymph node dissection under the care of Dr. Roxan Hockey on 10/18/2015 and the final pathology was consistent with stage IA (T1b, N0, MX). 2) upper endoscopy on 01/05/2016 showed normal esophagus, normal stomach but there was occasional mass around 3.0 CM in length circumferential nonobstructing in the jejunum. The final pathology was consistent with metastatic adenocarcinoma. 3) status post laparoscopic laparotomy and resection of proximal lesion and and distal jejunum/proximal ileum under the care of Dr. Hassell Done 1 02/27/2016.  CURRENT THERAPY: Systemic chemotherapy with carboplatin for AUC of 5, Alimta 500 MG/M2 and Avastin 15 MG/KG every 3 weeks. First dose 04/04/2016. We will hold the Avastin for the first cycle because of his recent bowel surgery.  INTERVAL HISTORY: Brett [name] 56 y.o. male came to the clinic today for follow-up visit accompanied by his wife. The patient is feeling fine today with no specific complaints except fatigue. He underwent surgical resection for the small intestine and metastatic disease under the care  of Dr. Hassell Done on 02/27/2016. The final pathology ([id number]) showed high-grade poorly differentiated carcinoma measuring 8.5 cm. The tumor involving mucosa, muscularis propria and subserosa. There was metastatic carcinoma in 6 of 10 lymph nodes and on the resection margins were negative for carcinoma. The second lesion from the distal small bowel also showed high-grade poorly differentiated carcinoma measuring 6.5 cm and the tumor involving the mucosa, muscularis propria and subserosa with the resection margins negative for malignancy. The immunohistochemical stains showed the neoplasm stains past for CK 7, negative for CK 20, TTF-1, Napsin A, CK 5/6, p63 and S100. The morphology and immunostain's pattern support metastatic poorly differentiated carcinoma.The patient denied having any other complaints today. He has no fever or chills. He has no nausea, vomiting, diarrhea or constipation. He has chest pain, shortness of breath, cough or hemoptysis. He is here today for evaluation and discussion of his treatment options.   MEDICAL HISTORY: Past Medical History:  Diagnosis Date  . Anemia 11/28/2015  . Anxiety   . Arthritis    "hands, back" (11/28/2015)  . Barrett's esophagus 07/01/2013   Without dysplasia on biopsy 09/03/2012. Repeat EGD recommended 08/2015  . Carotid artery stenosis 07/01/2013   Requiring right sided stent   . Chronic pain syndrome 07/01/2013  . Closed head injury with brief loss of consciousness ([facility]) 07/22/2010   Head trapped in a hydraulic device at work.  Fracture of orbital bones on right and brief loss of consciousness per report.  . 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 There was likely a strong psycho-social overlay in regard to the cognitive deficits in the form of mood disorder with psychotic features and mixed anxiety  symptomatology.  His primary tested cognitive deficits are in the areas of attention, executi  . Daily headache "since 07/2010"   constantly  . Degenerative joint disease of cervical spine 07/01/2013  . Dupuytren's contracture of both hands 04/08/2014  . Encounter for antineoplastic chemotherapy 10/05/2015  . Erectile dysfunction associated with type 2 diabetes mellitus ([facility]) 07/01/2013  . Fibromyalgia 07/01/2013  . History of blood transfusion 11/28/2015   "suppose to get his first today" (11/28/2015)  . Hyperlipidemia LDL goal < 100 07/01/2013  . Jejunal adenocarcinoma ([facility]) 02/13/2016  . Memory changes    "memory issues" from head injury  . Osteoarthritis of right thumb 10/21/2014  . Peripheral vascular occlusive disease ([facility]) 07/01/2013   Requiring 2 arterial stents above the left knee per report  . Pneumonia ~ 2006/2007  . Post traumatic stress disorder 07/01/2013  . Primary lung adenocarcinoma ([facility]) dx'd 08/2015   "right lung"  . Severe major depression with psychotic features ([facility]) 04/15/2011  . Tobacco abuse 07/01/2013  . Tobacco abuse   . Type 2 diabetes mellitus with vascular disease ([facility]) 07/01/2013   Left lower extremity and right carotid stenting    ALLERGIES:  is allergic to gabapentin and celebrex [celecoxib].  MEDICATIONS:  Current Outpatient Prescriptions  Medication Sig Dispense Refill  . aspirin EC 81 MG tablet Take 81 mg by mouth every other day.    Marland Kitchen atorvastatin (LIPITOR) 40 MG tablet Take 1 tablet (40 mg total) by mouth daily. 90 tablet 3  . ferrous sulfate 325 (65 FE) MG tablet Take 1 tablet (325 mg total) by mouth 2 (two) times daily with a meal. 60 tablet 3  . glipiZIDE (GLUCOTROL) 10 MG tablet Take 2 tablets (20 mg total) by mouth 2 (two) times daily before a meal. 360 tablet 3  . HYDROcodone-acetaminophen (NORCO) 10-325 MG tablet Take 1 tablet by mouth every 8 (eight) hours as needed for severe pain. 90 tablet 0  . HYDROcodone-acetaminophen (NORCO/VICODIN) 5-325 MG tablet Take 1-2  tablets by mouth every 4 (four) hours as needed for moderate pain or severe pain. 20 tablet 0  . lisinopril (PRINIVIL,ZESTRIL) 5 MG tablet Take 0.5 tablets (2.5 mg total) by mouth daily. 90 tablet 3  . metFORMIN (GLUCOPHAGE) 500 MG tablet Take 1 tablet (500 mg total) by mouth 2 (two) times daily with a meal. 180 tablet 3  . pantoprazole (PROTONIX) 40 MG tablet Take 1 tablet (40 mg total) by mouth daily. 90 tablet 3  . prochlorperazine (COMPAZINE) 10 MG tablet Take 1 tablet (10 mg total) by mouth every 6 (six) hours as needed for nausea or vomiting. 30 tablet 0  . sitaGLIPtin (JANUVIA) 100 MG tablet Take 1 tablet (100 mg total) by mouth daily. 90 tablet 3   No current facility-administered medications for this visit.     SURGICAL HISTORY:  Past Surgical History:  Procedure Laterality Date  . CAROTID STENT Right ?2014  . COLONOSCOPY N/A 11/30/2015   Procedure: COLONOSCOPY;  Surgeon: Teena Irani, MD;  Location: [facility] ENDOSCOPY;  Service: Endoscopy;  Laterality: N/A;  . ESOPHAGOGASTRODUODENOSCOPY N/A 11/30/2015   Procedure: ESOPHAGOGASTRODUODENOSCOPY (EGD);  Surgeon: Teena Irani, MD;  Location[facility] ENDOSCOPY;  Service: Endoscopy;  Laterality: N/A;  . ESOPHAGOGASTRODUODENOSCOPY (EGD) WITH PROPOFOL N/A 01/05/2016   Procedure: ESOPHAGOGASTRODUODENOSCOPY (EGD) WITH PROPOFOL;  Surgeon: Teena Irani, MD;  Location: [facility] ENDOSCOPY;  Service: Endoscopy;  Laterality: N/A;  . FEMORAL ARTERY STENT Left 05/2012; ~ 2015   Archie Endo 06/04/2012; Raechel Chute report  . FRACTURE SURGERY    .  GIVENS CAPSULE STUDY N/A 12/22/2015   Procedure: GIVENS CAPSULE STUDY;  Surgeon: Wonda Horner, MD;  Location: [facility] ENDOSCOPY;  Service: Endoscopy;  Laterality: N/A;  . HARDWARE REMOVAL Right 11/15/2011   Removal of deep frontozygomatic orbital hardware/notes 11/15/2011  . HERNIA REPAIR  2761   Umbilical  . LAPAROSCOPY N/A 02/27/2016   Procedure: LAPAROSCOPY, LAPAROTOMY  WITH TWO SMALL BOWEL RESECTION;  Surgeon: Johnathan Hausen, MD;  Location: [facility]  ORS;  Service: General;  Laterality: N/A;  . ORIF ORBITAL FRACTURE Right 08/15/2010    caught in a hydraulic machine; open reduction internal fixation of orbital rim fracture and open reduction of zygomatic arch fracture  Archie Endo 10/13/2009  . VIDEO ASSISTED THORACOSCOPY (VATS)/ LOBECTOMY Right 10/18/2015   Procedure: VIDEO ASSISTED THORACOSCOPY (VATS)/ LOBECTOMY;  Surgeon: Melrose Nakayama, MD;  Location: [facility];  Service: Thoracic;  Laterality: Right;  Marland Kitchen VIDEO BRONCHOSCOPY Bilateral 09/21/2015   Procedure: VIDEO BRONCHOSCOPY WITH FLUORO;  Surgeon: Juanito Doom, MD;  Location: [facility] ENDOSCOPY;  Service: Cardiopulmonary;  Laterality: Bilateral;    REVIEW OF SYSTEMS:  Constitutional: positive for fatigue Eyes: negative Ears, nose, mouth, throat, and face: negative Respiratory: negative Cardiovascular: negative Gastrointestinal: negative Genitourinary:negative Integument/breast: negative Hematologic/lymphatic: negative Musculoskeletal:negative Neurological: negative Behavioral/Psych: negative Endocrine: negative Allergic/Immunologic: negative   PHYSICAL EXAMINATION: General appearance: alert, cooperative, fatigued and no distress Head: Normocephalic, without obvious abnormality, atraumatic Neck: no adenopathy, no JVD, supple, symmetrical, trachea midline and thyroid not enlarged, symmetric, no tenderness/mass/nodules Lymph nodes: Cervical, supraclavicular, and axillary nodes normal. Resp: clear to auscultation bilaterally Back: symmetric, no curvature. ROM normal. No CVA tenderness. Cardio: regular rate and rhythm, S1, S2 normal, no murmur, click, rub or gallop GI: soft, non-tender; bowel sounds normal; no masses,  no organomegaly Extremities: extremities normal, atraumatic, no cyanosis or edema Neurologic: Alert and oriented X 3, normal strength and tone. Normal symmetric reflexes. Normal coordination and gait  ECOG PERFORMANCE STATUS: 1 - Symptomatic but completely  ambulatory  There were no vitals taken for this visit.  LABORATORY DATA: Lab Results  Component Value Date   WBC 8.2 03/02/2016   HGB 9.4 (L) 03/02/2016   HCT 31.1 (L) 03/02/2016   MCV 73.7 (L) 03/02/2016   PLT 395 03/02/2016      Chemistry      Component Value Date/Time   NA 139 03/02/2016 0500   NA 140 10/05/2015 1453   K 3.7 03/02/2016 0500   K 3.9 10/05/2015 1453   CL 102 03/02/2016 0500   CO2 30 03/02/2016 0500   CO2 28 10/05/2015 1453   BUN 7 03/02/2016 0500   BUN 8.8 10/05/2015 1453   CREATININE 0.66 03/02/2016 0500   CREATININE 0.8 10/05/2015 1453      Component Value Date/Time   CALCIUM 9.0 03/02/2016 0500   CALCIUM 9.1 10/05/2015 1453   ALKPHOS 75 12/23/2015 0148   ALKPHOS 112 10/05/2015 1453   AST 11 (L) 12/23/2015 0148   AST 9 10/05/2015 1453   ALT 11 (L) 12/23/2015 0148   ALT 9 10/05/2015 1453   BILITOT 0.7 12/23/2015 0148   BILITOT <0.30 10/05/2015 1453       RADIOGRAPHIC STUDIES: No results found.   ASSESSMENT AND PLAN:  This is a very pleasant 56 years old white male with metastatic non-small cell lung cancer initially diagnosed as stage IA adenocarcinoma status post right upper lobectomy but the patient developed lesions in the small intestine and he underwent surgical resection and the final pathology was consistent with metastatic high-grade poorly differentiated  carcinoma. I had a lengthy discussion with the patient and his wife today about his current disease stage, prognosis and treatment options. I discussed with him the goals of care and the patient and his wife understand that he has incurable condition and or the treatment will be of palliative nature. I gave him the option of palliative care versus consideration of palliative systemic chemotherapy with carboplatin for AUC of 5, Alimta 500 MG/M2 and Avastin 15 MG/KG every 3 weeks. I may consider holding the Avastin until cycle #2 because of his recent bowel surgery. The patient would like  to proceed with the treatment. I discussed with the patient adverse effects of this treatment including but not limited to alopecia, myelosuppression, nausea and vomiting, peripheral neuropathy, liver or renal dysfunction. We will arrange for the patient to receive vitamin B 12 injection today. The patient would also receive prescription for Compazine 10 mg by mouth every 6 hours as needed for nausea, Decadron 4 mg by mouth twice a day, the day before, day of and day after the chemotherapy in addition to folic acid 1 mg by mouth daily as well as Emla cream. I will arrange for the patient to have a chemotherapy education class before starting the first dose of the chemotherapy. He is expected to start the first cycle of this treatment next week. I will arrange for the patient to have a Port-A-Cath placed by Dr. Roxan Hockey before his treatment. He would come back for follow-up visit in 2 weeks for evaluation and management of any adverse effect of his treatment. For the iron deficiency anemia, we will arrange for the patient to start oral iron tablets over the counter. For the history of diabetes mellitus, he will continue his current treatment with metformin, Januvia and Glucotrol. For pain management, he will continue treatment with Norco. The patient was advised to call immediately if he has any concerning symptoms in the interval. The patient voices understanding of current disease status and treatment options and is in agreement with the current care plan.  All questions were answered. The patient knows to call the clinic with any problems, questions or concerns. We can certainly see the patient much sooner if necessary.  Disclaimer: This note was dictated with voice recognition software. Similar sounding words can inadvertently be transcribed and may not be corrected upon review.

## 2016-03-28 NOTE — Progress Notes (Signed)
DISCONTINUE ON PATHWAY REGIMEN - Non-Small Cell Lung  No Medical Intervention - Off Treatment.  REASON: Other Reason PRIOR TREATMENT: Off Treatment  START ON PATHWAY REGIMEN - Non-Small Cell Lung     A cycle is every 21 days:     Carboplatin      Pemetrexed      Bevacizumab   **Always confirm dose/schedule in your pharmacy ordering system**    Patient Characteristics: Stage IV Metastatic, Non Squamous, Initial Chemotherapy/Immunotherapy, PS = 0, 1, PD-L1 Expression Positive 1-49% (TPS) / Negative / Not Tested / Not a Candidate for Immunotherapy AJCC T Category: T1b Current Disease Status: Distant Metastases AJCC N Category: N0 AJCC M Category: M1c AJCC 8 Stage Grouping: IVB Histology: Non Squamous Cell ROS1 Rearrangement Status: Negative T790M Mutation Status: Not Applicable - EGFR Mutation Negative/Unknown Other Mutations/Biomarkers: No Other Actionable Mutations PD-L1 Expression Status: PD-L1 Positive 1-49% (TPS) Chemotherapy/Immunotherapy LOT: Initial Chemotherapy/Immunotherapy Molecular Targeted Therapy: Not Appropriate ALK Translocation Status: Negative Would you be surprised if this patient died  in the next year? I would NOT be surprised if this patient died in the next year EGFR Mutation Status: Negative/Wild Type BRAF V600E Mutation Status: Negative Performance Status: PS = 0, 1  Intent of Therapy: Non-Curative / Palliative Intent, Discussed with Patient

## 2016-03-28 NOTE — Telephone Encounter (Signed)
Gave patient AVS and schedule per 03/28/2016 los. Treatment per 03/28/2016 los on 3/15 not scheduled yet do to capped day. Emailed Insurance account manager for availability. Patient aware. Lab for Ct scheduled and Chemo Education scheduled. Patient to come in after Chemo Education to pick up new schedule. [facility] to contact patient with CT schedule.

## 2016-03-29 ENCOUNTER — Other Ambulatory Visit: Payer: Medicare Other

## 2016-03-29 ENCOUNTER — Other Ambulatory Visit: Payer: Self-pay | Admitting: *Deleted

## 2016-03-29 ENCOUNTER — Other Ambulatory Visit: Payer: Self-pay

## 2016-03-29 ENCOUNTER — Encounter: Payer: Self-pay | Admitting: *Deleted

## 2016-03-29 DIAGNOSIS — C3490 Malignant neoplasm of unspecified part of unspecified bronchus or lung: Secondary | ICD-10-CM

## 2016-03-29 NOTE — Progress Notes (Signed)
Does not need to stop the Plavix per Dr Roxan Hockey

## 2016-03-29 NOTE — Progress Notes (Signed)
Oncology Nurse Navigator Documentation  Oncology Nurse Navigator Flowsheets 03/29/2016  Navigator Location [facility]  Navigator Encounter Type Clinic/[facility], Nurse Educator spoke to me today regarding Brett [name].  Brett [name] has a few questions about [place].  I spoke with him and his wife.  I updated Dr. Julien Nordmann regarding questions and will update them when I know more.   Barriers/Navigation Needs Education  Education Other  Interventions Education  Education Method Verbal;Written  Acuity Level 2  Acuity Level 2 Other  Time Spent with Patient 30

## 2016-04-01 ENCOUNTER — Telehealth: Payer: Self-pay | Admitting: *Deleted

## 2016-04-01 ENCOUNTER — Other Ambulatory Visit: Payer: Self-pay

## 2016-04-01 ENCOUNTER — Encounter: Payer: Self-pay | Admitting: Internal Medicine

## 2016-04-01 NOTE — Progress Notes (Signed)
Called pt to introduce myself as his FA.  Pt has 2 insurances so copay assistance is not needed.  I informed him of the [place] and went over what it covers.  Pt would like to apply so he will bring his proof of income on 04/05/16.

## 2016-04-01 NOTE — Telephone Encounter (Signed)
Oncology Nurse Navigator Documentation  Oncology Nurse Navigator Flowsheets 04/01/2016  Navigator Location [facility]  Navigator Encounter Type Telephone;Other/I received an update from Dr. Julien Nordmann on Mr. Hagmann.  I called patient but was unable to reach.  Mr. Reinhardt does not need labs today.  I will update lab and I left vm message that he does not need labs today and to call me with further update.    Barriers/Navigation Needs Coordination of Care;Education  Education Other  Interventions Coordination of Care;Education  Coordination of Care Other  Education Method Verbal  Acuity Level 2  Acuity Level 2 Educational needs;Other  Time Spent with Patient 15

## 2016-04-01 NOTE — Telephone Encounter (Signed)
Called home phone and was unable to reach patient.

## 2016-04-02 ENCOUNTER — Telehealth: Payer: Self-pay | Admitting: Medical Oncology

## 2016-04-02 NOTE — Telephone Encounter (Signed)
Reviewed decadron dosing instructions for chemo premed.

## 2016-04-03 ENCOUNTER — Other Ambulatory Visit: Payer: Self-pay | Admitting: Internal Medicine

## 2016-04-03 ENCOUNTER — Ambulatory Visit (HOSPITAL_COMMUNITY)
Admission: RE | Admit: 2016-04-03 | Discharge: 2016-04-03 | Disposition: A | Discharge status: Home / Self Care | Payer: Medicare Other | Source: Ambulatory Visit | Attending: Internal Medicine | Admitting: Internal Medicine

## 2016-04-03 DIAGNOSIS — C3490 Malignant neoplasm of unspecified part of unspecified bronchus or lung: Secondary | ICD-10-CM | POA: Diagnosis not present

## 2016-04-03 DIAGNOSIS — Z9889 Other specified postprocedural states: Secondary | ICD-10-CM | POA: Diagnosis not present

## 2016-04-03 DIAGNOSIS — C3411 Malignant neoplasm of upper lobe, right bronchus or lung: Secondary | ICD-10-CM | POA: Diagnosis not present

## 2016-04-03 DIAGNOSIS — C171 Malignant neoplasm of jejunum: Secondary | ICD-10-CM

## 2016-04-03 DIAGNOSIS — E1159 Type 2 diabetes mellitus with other circulatory complications: Secondary | ICD-10-CM

## 2016-04-03 DIAGNOSIS — C3491 Malignant neoplasm of unspecified part of right bronchus or lung: Secondary | ICD-10-CM | POA: Diagnosis not present

## 2016-04-03 MED ORDER — IOPAMIDOL (ISOVUE-300) INJECTION 61%
INTRAVENOUS | Status: AC
  Filled 2016-04-03: qty 100

## 2016-04-03 MED ORDER — IOPAMIDOL (ISOVUE-300) INJECTION 61%
100.0000 mL | Freq: Once | INTRAVENOUS | Status: AC | PRN
  Administered 2016-04-03: 100 mL via INTRAVENOUS

## 2016-04-03 MED ORDER — GADOBENATE DIMEGLUMINE 529 MG/ML IV SOLN
15.0000 mL | Freq: Once | INTRAVENOUS | Status: AC | PRN
  Administered 2016-04-03: 12 mL via INTRAVENOUS

## 2016-04-04 ENCOUNTER — Ambulatory Visit (HOSPITAL_COMMUNITY): Payer: Medicare Other

## 2016-04-04 ENCOUNTER — Ambulatory Visit (HOSPITAL_COMMUNITY)
Admission: RE | Admit: 2016-04-04 | Discharge: 2016-04-04 | Disposition: A | Discharge status: Home / Self Care | Payer: Medicare Other | Source: Ambulatory Visit | Attending: Thoracic Surgery (Cardiothoracic Vascular Surgery) | Admitting: Thoracic Surgery (Cardiothoracic Vascular Surgery)

## 2016-04-04 ENCOUNTER — Ambulatory Visit (HOSPITAL_COMMUNITY): Payer: Medicare Other | Admitting: Certified Registered Nurse Anesthetist

## 2016-04-04 ENCOUNTER — Encounter (HOSPITAL_COMMUNITY)
Admission: RE | Disposition: A | Discharge status: Home / Self Care | Payer: Self-pay | Source: Ambulatory Visit | Attending: Thoracic Surgery (Cardiothoracic Vascular Surgery)

## 2016-04-04 ENCOUNTER — Encounter (HOSPITAL_COMMUNITY): Payer: Self-pay | Admitting: *Deleted

## 2016-04-04 DIAGNOSIS — Z7902 Long term (current) use of antithrombotics/antiplatelets: Secondary | ICD-10-CM | POA: Diagnosis not present

## 2016-04-04 DIAGNOSIS — Z87891 Personal history of nicotine dependence: Secondary | ICD-10-CM | POA: Insufficient documentation

## 2016-04-04 DIAGNOSIS — C3411 Malignant neoplasm of upper lobe, right bronchus or lung: Secondary | ICD-10-CM | POA: Insufficient documentation

## 2016-04-04 DIAGNOSIS — E1151 Type 2 diabetes mellitus with diabetic peripheral angiopathy without gangrene: Secondary | ICD-10-CM | POA: Diagnosis not present

## 2016-04-04 DIAGNOSIS — Z7984 Long term (current) use of oral hypoglycemic drugs: Secondary | ICD-10-CM | POA: Insufficient documentation

## 2016-04-04 DIAGNOSIS — E785 Hyperlipidemia, unspecified: Secondary | ICD-10-CM | POA: Diagnosis not present

## 2016-04-04 DIAGNOSIS — Z79899 Other long term (current) drug therapy: Secondary | ICD-10-CM | POA: Insufficient documentation

## 2016-04-04 DIAGNOSIS — R05 Cough: Secondary | ICD-10-CM | POA: Diagnosis not present

## 2016-04-04 DIAGNOSIS — Z95820 Peripheral vascular angioplasty status with implants and grafts: Secondary | ICD-10-CM | POA: Insufficient documentation

## 2016-04-04 DIAGNOSIS — M1811 Unilateral primary osteoarthritis of first carpometacarpal joint, right hand: Secondary | ICD-10-CM | POA: Diagnosis not present

## 2016-04-04 DIAGNOSIS — C784 Secondary malignant neoplasm of small intestine: Secondary | ICD-10-CM | POA: Insufficient documentation

## 2016-04-04 DIAGNOSIS — E119 Type 2 diabetes mellitus without complications: Secondary | ICD-10-CM | POA: Diagnosis not present

## 2016-04-04 DIAGNOSIS — D649 Anemia, unspecified: Secondary | ICD-10-CM | POA: Diagnosis not present

## 2016-04-04 DIAGNOSIS — C3490 Malignant neoplasm of unspecified part of unspecified bronchus or lung: Secondary | ICD-10-CM

## 2016-04-04 DIAGNOSIS — Z419 Encounter for procedure for purposes other than remedying health state, unspecified: Secondary | ICD-10-CM

## 2016-04-04 DIAGNOSIS — Z7982 Long term (current) use of aspirin: Secondary | ICD-10-CM | POA: Diagnosis not present

## 2016-04-04 DIAGNOSIS — Z452 Encounter for adjustment and management of vascular access device: Secondary | ICD-10-CM | POA: Diagnosis not present

## 2016-04-04 HISTORY — PX: PORTACATH PLACEMENT: SHX2246

## 2016-04-04 LAB — COMPREHENSIVE METABOLIC PANEL
ALT: 23 U/L (ref 17–63)
AST: 27 U/L (ref 15–41)
Albumin: 3.9 g/dL (ref 3.5–5.0)
Alkaline Phosphatase: 64 U/L (ref 38–126)
Anion gap: 9 (ref 5–15)
BUN: 7 mg/dL (ref 6–20)
CO2: 28 mmol/L (ref 22–32)
Calcium: 9.1 mg/dL (ref 8.9–10.3)
Chloride: 103 mmol/L (ref 101–111)
Creatinine, Ser: 0.61 mg/dL (ref 0.61–1.24)
GFR calc Af Amer: >60 mL/min (ref >60)
GFR calc non Af Amer: >60 mL/min (ref >60)
Glucose, Bld: 140 mg/dL — ABNORMAL HIGH (ref 65–99)
Potassium: 3.8 mmol/L (ref 3.5–5.1)
Sodium: 140 mmol/L (ref 135–145)
Total Bilirubin: 0.5 mg/dL (ref 0.3–1.2)
Total Protein: 6.9 g/dL (ref 6.5–8.1)

## 2016-04-04 LAB — GLUCOSE, CAPILLARY: Glucose-Capillary: 118 mg/dL — ABNORMAL HIGH (ref 65–99)

## 2016-04-04 LAB — CBC
HCT: 38.1 % — ABNORMAL LOW (ref 39.0–52.0)
Hemoglobin: 11.4 g/dL — ABNORMAL LOW (ref 13.0–17.0)
MCH: 22.9 pg — ABNORMAL LOW (ref 26.0–34.0)
MCHC: 29.9 g/dL — ABNORMAL LOW (ref 30.0–36.0)
MCV: 76.5 fL — ABNORMAL LOW (ref 78.0–100.0)
Platelets: 256 10*3/uL (ref 150–400)
RBC: 4.98 MIL/uL (ref 4.22–5.81)
RDW: 18.5 % — ABNORMAL HIGH (ref 11.5–15.5)
WBC: 6.8 10*3/uL (ref 4.0–10.5)

## 2016-04-04 LAB — PROTIME-INR
INR: 1.01
Prothrombin Time: 13.3 seconds (ref 11.4–15.2)

## 2016-04-04 LAB — APTT: aPTT: 28 seconds (ref 24–36)

## 2016-04-04 SURGERY — INSERTION, TUNNELED CENTRAL VENOUS DEVICE, WITH PORT
Anesthesia: Monitor Anesthesia Care | Site: Chest | Laterality: Left

## 2016-04-04 MED ORDER — PROPOFOL 10 MG/ML IV BOLUS
INTRAVENOUS | Status: AC
  Filled 2016-04-04: qty 20

## 2016-04-04 MED ORDER — FENTANYL CITRATE (PF) 100 MCG/2ML IJ SOLN
INTRAMUSCULAR | Status: DC | PRN
  Administered 2016-04-04: 50 ug via INTRAVENOUS

## 2016-04-04 MED ORDER — MIDAZOLAM HCL 2 MG/2ML IJ SOLN
INTRAMUSCULAR | Status: AC
  Filled 2016-04-04: qty 2

## 2016-04-04 MED ORDER — LACTATED RINGERS IV SOLN
INTRAVENOUS | Status: DC
  Administered 2016-04-04: 12:00:00 via INTRAVENOUS

## 2016-04-04 MED ORDER — FENTANYL CITRATE (PF) 100 MCG/2ML IJ SOLN
INTRAMUSCULAR | Status: AC
  Filled 2016-04-04: qty 2

## 2016-04-04 MED ORDER — LIDOCAINE HCL 1 % IJ SOLN
INTRAMUSCULAR | Status: DC | PRN
  Administered 2016-04-04: 30 mL

## 2016-04-04 MED ORDER — ONDANSETRON HCL 4 MG/2ML IJ SOLN: 4.0000 mg | Freq: Once | INTRAMUSCULAR | Status: DC | PRN

## 2016-04-04 MED ORDER — 0.9 % SODIUM CHLORIDE (POUR BTL) OPTIME
TOPICAL | Status: DC | PRN
  Administered 2016-04-04: 1000 mL

## 2016-04-04 MED ORDER — LIDOCAINE HCL (PF) 1 % IJ SOLN
INTRAMUSCULAR | Status: AC
  Filled 2016-04-04: qty 30

## 2016-04-04 MED ORDER — LIDOCAINE HCL (CARDIAC) 20 MG/ML IV SOLN
INTRAVENOUS | Status: DC | PRN
  Administered 2016-04-04: 60 mg via INTRATRACHEAL

## 2016-04-04 MED ORDER — ONDANSETRON HCL 4 MG/2ML IJ SOLN
INTRAMUSCULAR | Status: DC | PRN
  Administered 2016-04-04: 4 mg via INTRAVENOUS

## 2016-04-04 MED ORDER — SODIUM CHLORIDE 0.9 % IV SOLN
INTRAVENOUS | Status: DC | PRN
  Administered 2016-04-04: 14:00:00

## 2016-04-04 MED ORDER — MIDAZOLAM HCL 5 MG/5ML IJ SOLN
INTRAMUSCULAR | Status: DC | PRN
  Administered 2016-04-04: 2 mg via INTRAVENOUS

## 2016-04-04 MED ORDER — DEXTROSE 5 % IV SOLN
1.5000 g | INTRAVENOUS | Status: AC
  Administered 2016-04-04: 1.5 g via INTRAVENOUS
  Filled 2016-04-04: qty 1.5

## 2016-04-04 MED ORDER — HEPARIN SODIUM (PORCINE) 1000 UNIT/ML IJ SOLN
INTRAMUSCULAR | Status: AC
  Filled 2016-04-04: qty 1

## 2016-04-04 MED ORDER — HEPARIN SODIUM (PORCINE) 1000 UNIT/ML IJ SOLN
INTRAMUSCULAR | Status: DC | PRN
Start: 2016-04-04 — End: 2016-04-04
  Administered 2016-04-04: 2.5 mL

## 2016-04-04 MED ORDER — PROPOFOL 500 MG/50ML IV EMUL
INTRAVENOUS | Status: DC | PRN
  Administered 2016-04-04: 100 ug/kg/min via INTRAVENOUS

## 2016-04-04 MED ORDER — FENTANYL CITRATE (PF) 100 MCG/2ML IJ SOLN
25.0000 ug | INTRAMUSCULAR | Status: DC | PRN
Start: 2016-04-04 — End: 2016-04-04

## 2016-04-04 SURGICAL SUPPLY — 40 items
ADH SKN CLS APL DERMABOND .7 (GAUZE/BANDAGES/DRESSINGS) ×1
BAG DECANTER FOR FLEXI CONT (MISCELLANEOUS) ×2 IMPLANT
CANISTER SUCT 3000ML PPV (MISCELLANEOUS) ×2 IMPLANT
COVER SURGICAL LIGHT HANDLE (MISCELLANEOUS) ×2 IMPLANT
DERMABOND ADVANCED (GAUZE/BANDAGES/DRESSINGS) ×1
DERMABOND ADVANCED .7 DNX12 (GAUZE/BANDAGES/DRESSINGS) ×1 IMPLANT
DRAPE C-ARM 42X72 X-RAY (DRAPES) ×2 IMPLANT
DRAPE CHEST BREAST 15X10 FENES (DRAPES) ×2 IMPLANT
ELECT REM PT RETURN 9FT ADLT (ELECTROSURGICAL) ×2
ELECTRODE REM PT RTRN 9FT ADLT (ELECTROSURGICAL) ×1 IMPLANT
GLOVE SURG SIGNA 7.5 PF LTX (GLOVE) ×2 IMPLANT
GLOVE SURG SS PI 7.0 STRL IVOR (GLOVE) ×1 IMPLANT
GOWN STRL REUS W/ TWL LRG LVL3 (GOWN DISPOSABLE) ×1 IMPLANT
GOWN STRL REUS W/ TWL XL LVL3 (GOWN DISPOSABLE) ×1 IMPLANT
GOWN STRL REUS W/TWL LRG LVL3 (GOWN DISPOSABLE) ×2
GOWN STRL REUS W/TWL XL LVL3 (GOWN DISPOSABLE) ×4
GUIDEWIRE UNCOATED ST S 7038 (WIRE) IMPLANT
INTRODUCER 13FR (MISCELLANEOUS) IMPLANT
INTRODUCER COOK 11FR (CATHETERS) IMPLANT
KIT BASIN OR (CUSTOM PROCEDURE TRAY) ×2 IMPLANT
KIT PORT POWER 9.6FR MRI PREA (Catheter) ×1 IMPLANT
KIT ROOM TURNOVER OR (KITS) ×2 IMPLANT
NDL HYPO 25GX1X1/2 BEV (NEEDLE) ×1 IMPLANT
NEEDLE HYPO 25GX1X1/2 BEV (NEEDLE) ×2 IMPLANT
NS IRRIG 1000ML POUR BTL (IV SOLUTION) ×2 IMPLANT
PACK GENERAL/GYN (CUSTOM PROCEDURE TRAY) ×2 IMPLANT
PAD ARMBOARD 7.5X6 YLW CONV (MISCELLANEOUS) ×4 IMPLANT
SET SHEATH INTRODUCER 10FR (MISCELLANEOUS) IMPLANT
SUT MNCRL AB 4-0 PS2 18 (SUTURE) IMPLANT
SUT VIC AB 3-0 SH 27 (SUTURE) ×6
SUT VIC AB 3-0 SH 27X BRD (SUTURE) ×2 IMPLANT
SUT VICRYL 4-0 PS2 18IN ABS (SUTURE) ×2 IMPLANT
SYR 20CC LL (SYRINGE) ×2 IMPLANT
SYR 3ML LL SCALE MARK (SYRINGE) ×1 IMPLANT
SYR 5ML LL (SYRINGE) ×2 IMPLANT
SYR CONTROL 10ML LL (SYRINGE) ×2 IMPLANT
TOWEL OR 17X24 6PK STRL BLUE (TOWEL DISPOSABLE) ×2 IMPLANT
TOWEL OR 17X26 10 PK STRL BLUE (TOWEL DISPOSABLE) ×2 IMPLANT
WATER STERILE IRR 1000ML POUR (IV SOLUTION) ×2 IMPLANT
WIRE .035 3MM-J 145CM (WIRE) IMPLANT

## 2016-04-04 NOTE — Anesthesia Procedure Notes (Signed)
Procedure Name: MAC Date/Time: 04/04/2016 2:32 PM Performed by: Babs Bertin Pre-anesthesia Checklist: Patient identified, Emergency Drugs available, Suction available, Patient being monitored and Timeout performed Patient Re-evaluated:Patient Re-evaluated prior to inductionOxygen Delivery Method: Nasal cannula

## 2016-04-04 NOTE — Brief Op Note (Signed)
04/04/2016  3:15 PM  PATIENT:  Brett [name]  56 y.o. male  PRE-OPERATIVE DIAGNOSIS:  non-small cell lung cancer, needs IV access for chemo  POST-OPERATIVE DIAGNOSIS:  non-small cell lung cancer, needs IV access for chemo  PROCEDURE:  Procedure(s): INSERTION PORT-A-CATH LEFT CHEST (Left)  SURGEON:  Surgeon(s) and Role:    * Melrose Nakayama, MD - Primary  ANESTHESIA:   local and IV sedation  EBL:  Total I/O In: 500 [I.V.:500] Out: -   BLOOD ADMINISTERED:none  DRAINS: none   LOCAL MEDICATIONS USED:  LIDOCAINE  and Amount: 10 ml  SPECIMEN:  No Specimen  DISPOSITION OF SPECIMEN:  N/A  COUNTS:  YES  PLAN OF CARE: Discharge to home after PACU  PATIENT DISPOSITION:  PACU - hemodynamically stable.   Delay start of Pharmacological VTE agent (>24hrs) due to surgical blood loss or risk of bleeding: not applicable

## 2016-04-04 NOTE — H&P (Signed)
[address]       [place] [address]             [phone]      Brett [name] presents for portacath placement for IV access for chemotherapy  56 yo man who recently had a right upper lobectomy for a stage IA nonsmall cell carcinoma. More recently found to have GI bleed. Ultimately underwent small bowel resection for metastatic poorly differentiated carcinoma. Now needs chemotherapy.  Past Medical History:  Diagnosis Date  . Anemia 11/28/2015  . Anxiety   . Arthritis    "hands, back" (11/28/2015)  . Barrett's esophagus 07/01/2013   Without dysplasia on biopsy 09/03/2012. Repeat EGD recommended 08/2015  . Carotid artery stenosis 07/01/2013   Requiring right sided stent   . Chronic pain syndrome 07/01/2013  . Closed head injury with brief loss of consciousness ([place]) 07/22/2010   Head trapped in a hydraulic device at work.  Fracture of orbital bones on right and brief loss of consciousness per report.  . 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 There was likely a strong psycho-social overlay in regard to the cognitive deficits in the form of mood disorder with psychotic features and mixed anxiety symptomatology. His primary tested cognitive deficits are in the areas of attention, executi  . Daily headache "since 07/2010"   constantly  . Degenerative joint disease of cervical spine 07/01/2013  . Dupuytren's contracture of both hands 04/08/2014  . Encounter for antineoplastic chemotherapy 10/05/2015  . Erectile dysfunction associated with type 2 diabetes mellitus ([place]) 07/01/2013  . Fibromyalgia 07/01/2013  . Goals of care, counseling/discussion 03/28/2016  . History of blood transfusion 11/28/2015   "suppose to get his first today" (11/28/2015)  . Hyperlipidemia LDL goal < 100 07/01/2013  . 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  Memory changes    "memory issues" from head injury  . Osteoarthritis of right thumb 10/21/2014  . Peripheral vascular occlusive disease ([place]) 07/01/2013   Requiring 2 arterial stents above the left knee per report  . Pneumonia ~ 2006/2007  . Post traumatic stress disorder 07/01/2013  . Primary lung adenocarcinoma ([place]) dx'd 08/2015   "right lung"  . Severe major depression with psychotic features ([place]) 04/15/2011  . Tobacco abuse 07/01/2013  . Tobacco abuse   . Type 2 diabetes mellitus with vascular disease ([place]) 07/01/2013   Left lower extremity and right carotid stenting   No current facility-administered medications on file prior to encounter.    Current Outpatient Prescriptions on File Prior to Encounter  Medication Sig Dispense Refill  . aspirin EC 81 MG tablet Take 81 mg by mouth daily.     Marland Kitchen atorvastatin (LIPITOR) 40 MG tablet Take 1 tablet (40 mg total) by mouth daily. (Patient taking differently: Take 40 mg by mouth every evening. ) 90 tablet 3  . clopidogrel (PLAVIX) 75 MG tablet Take 75 mg by mouth at bedtime.     . ferrous sulfate 325 (65 FE) MG tablet Take 1 tablet (325 mg total) by mouth 2 (two) times daily with a meal. 60 tablet 3  . folic acid (FOLVITE) 1 MG tablet Take 1 tablet (1 mg total) by mouth daily. 30 tablet 4  . glipiZIDE (GLUCOTROL) 10 MG tablet Take 2 tablets (20 mg total) by mouth 2 (two) times daily before a meal. 360 tablet 3  . HYDROcodone-acetaminophen (  NORCO) 10-325 MG tablet Take 1 tablet by mouth every 8 (eight) hours as needed for severe pain. 90 tablet 0  . lisinopril (PRINIVIL,ZESTRIL) 5 MG tablet Take 0.5 tablets (2.5 mg total) by mouth daily. 90 tablet 3  . pantoprazole (PROTONIX) 40 MG tablet Take 1 tablet (40 mg total) by mouth daily. 90 tablet 3  . prochlorperazine (COMPAZINE) 10 MG tablet Take 1 tablet (10 mg total) by mouth every 6 (six) hours as needed for nausea or vomiting. 30 tablet 0  . sitaGLIPtin (JANUVIA) 100 MG tablet Take 1 tablet (100 mg  total) by mouth daily. (Patient taking differently: Take 100 mg by mouth at bedtime. ) 90 tablet 3  . dexamethasone (DECADRON) 4 MG tablet Take 1 tablet (4 mg total) by mouth 2 (two) times daily with a meal. 40 tablet 1  . lidocaine-prilocaine (EMLA) cream Apply 1 application topically as needed. 30 g 0   Past Surgical History:  Procedure Laterality Date  . CAROTID STENT Right ?2014  . COLONOSCOPY N/A 11/30/2015   Procedure: COLONOSCOPY;  Surgeon: Teena Irani, MD;  Location: [facility] ENDOSCOPY;  Service: Endoscopy;  Laterality: N/A;  . ESOPHAGOGASTRODUODENOSCOPY N/A 11/30/2015   Procedure: ESOPHAGOGASTRODUODENOSCOPY (EGD);  Surgeon: Teena Irani, MD;  Location: [facility] ENDOSCOPY;  Service: Endoscopy;  Laterality: N/A;  . ESOPHAGOGASTRODUODENOSCOPY (EGD) WITH PROPOFOL N/A 01/05/2016   Procedure: ESOPHAGOGASTRODUODENOSCOPY (EGD) WITH PROPOFOL;  Surgeon: Teena Irani, MD;  Location: [facility] ENDOSCOPY;  Service: Endoscopy;  Laterality: N/A;  . FEMORAL ARTERY STENT Left 05/2012; ~ 2015   Archie Endo 06/04/2012; Raechel Chute report  . FRACTURE SURGERY    . GIVENS CAPSULE STUDY N/A 12/22/2015   Procedure: GIVENS CAPSULE STUDY;  Surgeon: Wonda Horner, MD;  Location: [facility] ENDOSCOPY;  Service: Endoscopy;  Laterality: N/A;  . HARDWARE REMOVAL Right 11/15/2011   Removal of deep frontozygomatic orbital hardware/notes 11/15/2011  . HERNIA REPAIR  1610   Umbilical  . LAPAROSCOPY N/A 02/27/2016   Procedure: LAPAROSCOPY, LAPAROTOMY  WITH TWO SMALL BOWEL RESECTION;  Surgeon: Johnathan Hausen, MD;  Location: [facility] ORS;  Service: General;  Laterality: N/A;  . ORIF ORBITAL FRACTURE Right 08/15/2010    caught in a hydraulic machine; open reduction internal fixation of orbital rim fracture and open reduction of zygomatic arch fracture  Archie Endo 10/13/2009  . VIDEO ASSISTED THORACOSCOPY (VATS)/ LOBECTOMY Right 10/18/2015   Procedure: VIDEO ASSISTED THORACOSCOPY (VATS)/ LOBECTOMY;  Surgeon: Melrose Nakayama, MD;  Location: [facility];  Service: Thoracic;   Laterality: Right;  Marland Kitchen VIDEO BRONCHOSCOPY Bilateral 09/21/2015   Procedure: VIDEO BRONCHOSCOPY WITH FLUORO;  Surgeon: Juanito Doom, MD;  Location: [facility] ENDOSCOPY;  Service: Cardiopulmonary;  Laterality: Bilateral;   PE  BP 135/88   Pulse (!) 105   Temp 97.9 F (36.6 C) (Oral)   Resp 18   Ht '5\' 7"'$  (1.702 m)   Wt 133 lb (60.3 kg)   SpO2 100%   BMI 20.83 kg/m  56 yo man in NAD alert and oriented x 3 HEENT unremarkable Neck no JVD or adenopathy Cardiac RRR Lungs clear Abdomen scar healing well Extremities- no c,c,e  56 yo man with metastatic cancer to small bowel, needs IV access for chemo.  I discussed the general nature of the procedure, local anesthesia, and the incision to be used with Mr and Mrs Mowatt. I reviewed the indications, risks, benefits and alternatives. They understand the risks include, but are not limited to pneumothorax, bleeding, infection, thrombosis, as well as other unforeseeable comlications. They understand and accept the risks and agree  to proceed.  Revonda Standard Roxan Hockey, MD [facility] [phone]

## 2016-04-04 NOTE — Transfer of Care (Signed)
Immediate Anesthesia Transfer of Care Note  Patient: Townes Fuhs Petrucci  Procedure(s) Performed: Procedure(s): INSERTION PORT-A-CATH LEFT CHEST (Left)  Patient Location: PACU  Anesthesia Type:MAC  Level of Consciousness: awake, alert  and oriented  Airway & Oxygen Therapy: Patient Spontanous Breathing  Post-op Assessment: Report given to RN and Post -op Vital signs reviewed and stable  Post vital signs: Reviewed and stable  Last Vitals:  Vitals:   04/04/16 1205  BP: 135/88  Pulse: (!) 105  Resp: 18  Temp: 36.6 C    Last Pain:  Vitals:   04/04/16 1213  TempSrc:   PainSc: 8       Patients Stated Pain Goal: 5 (81/27/51 7001)  Complications: No apparent anesthesia complications

## 2016-04-04 NOTE — Anesthesia Preprocedure Evaluation (Addendum)
Anesthesia Evaluation  Patient identified by MRN, date of birth, ID band Patient awake    Reviewed: Allergy & Precautions, H&P , NPO status , Patient's Chart, lab work & pertinent test results  Airway Mallampati: II  TM Distance: >3 FB Neck ROM: Full    Dental no notable dental hx. (+) Upper Dentures, Edentulous Lower, Dental Advisory Given   Pulmonary neg pulmonary ROS, former smoker,    Pulmonary exam normal breath sounds clear to auscultation       Cardiovascular + Peripheral Vascular Disease  negative cardio ROS   Rhythm:Regular Rate:Normal     Neuro/Psych  Headaches, Anxiety Depression negative neurological ROS     GI/Hepatic negative GI ROS, Neg liver ROS,   Endo/Other  diabetes, Type 2, Oral Hypoglycemic Agents  Renal/GU negative Renal ROS  negative genitourinary   Musculoskeletal  (+) Arthritis , Osteoarthritis,  Fibromyalgia -  Abdominal   Peds  Hematology negative hematology ROS (+) anemia ,   Anesthesia Other Findings   Reproductive/Obstetrics negative OB ROS                            Anesthesia Physical Anesthesia Plan  ASA: III  Anesthesia Plan: MAC   Post-op Pain Management:    Induction: Intravenous  Airway Management Planned: Simple Face Mask  Additional Equipment:   Intra-op Plan:   Post-operative Plan:   Informed Consent: I have reviewed the patients History and Physical, chart, labs and discussed the procedure including the risks, benefits and alternatives for the proposed anesthesia with the patient or authorized representative who has indicated his/her understanding and acceptance.   Dental advisory given  Plan Discussed with: CRNA  Anesthesia Plan Comments:         Anesthesia Quick Evaluation

## 2016-04-04 NOTE — Op Note (Signed)
NAMEGREGG, Brett                 ACCOUNT NO.:  [id number]  MEDICAL RECORD NO.:  [id number]  LOCATION:                                 FACILITY:  PHYSICIAN:  Revonda Standard. Roxan Hockey, M.D.DATE OF BIRTH:  1960-05-29  DATE OF PROCEDURE:  04/04/2016 DATE OF DISCHARGE:                              OPERATIVE REPORT   PREOPERATIVE DIAGNOSIS:  Metastatic cancer, needs IV access for chemotherapy.  POSTOPERATIVE DIAGNOSIS:  Metastatic cancer, needs IV access for chemotherapy.  PROCEDURE:  Port-A-Cath placement to left subclavian vein.  SURGEON:  Revonda Standard. Roxan Hockey, MD.  ASSISTANT:  None.  ANESTHESIA:  Local with intravenous sedation.  FINDINGS:  Vein accessed on first pass.  Port-A-Cath positioned with the tip at the junction of the superior vena cava and right atrium.  CLINICAL NOTE:  [name] is a 56 year old gentleman recently diagnosed with metastatic cancer, who needs IV access for chemotherapy.  He was advised to undergo Port-A-Cath placement.  The indications, risks, benefits, and alternatives were discussed in detail with the patient. He understood and accepted the risks and agreed to proceed.  OPERATIVE NOTE:  [name] was brought to the operating room on April 04, 2016.  He was given intravenous antibiotics.  He was given intravenous anesthesia and intravenous sedation and monitored by Anesthesia.  The neck and chest were prepped and draped in usual sterile fashion.  After performing a time-out, the area of the procedure was anesthetized with 10 mL of 1% lidocaine.  He was placed in Trendelenburg position.  The left subclavian vein was accessed using the Seldinger technique.  The vein was accessed on the first pass.  The wire passed easily.  Fluoroscopy confirmed position of the wire within the right atrium.  An incision was made incorporating the wire.  Hemostasis was achieved.  A pocket was created for the port.  The port was cut to 19 cm length and pre-flushed  with heparinized saline.  The peel-away sheath introducer was advanced over the wire.  The wire and the inner cannula were removed.  The port was inserted through the peel-away sheath introducer which was then removed.  The port was placed into the pocket and fluoroscopy confirmed position of the tip of the catheter at the junction of the right atrium and superior vena cava.  The port was accessed with a Huber needle.  There was good return and the port flushed easily.  The port was secured to the pectoralis fascia with 3-0 Vicryl sutures.  The port was flushed with 2.5 mL of concentrated heparin.  The wound then was closed with a 3-0 Vicryl subcutaneous suture and a 4-0 Vicryl subcuticular suture.  Dermabond was applied.  All sponge, needle, and instrument counts were correct at the end of the procedure.  The patient was taken from the operating room to the postanesthetic care unit in good condition.     Revonda Standard Roxan Hockey, M.D.     SCH/MEDQ  D:  04/04/2016  T:  04/04/2016  Job:  [id number]

## 2016-04-04 NOTE — Anesthesia Postprocedure Evaluation (Signed)
Anesthesia Post Note  Patient: Brett [name]  Procedure(s) Performed: Procedure(s) (LRB): INSERTION PORT-A-CATH LEFT CHEST (Left)  Patient location during evaluation: PACU Anesthesia Type: MAC Level of consciousness: awake and alert Pain management: pain level controlled Vital Signs Assessment: post-procedure vital signs reviewed and stable Respiratory status: spontaneous breathing, nonlabored ventilation, respiratory function stable and patient connected to nasal cannula oxygen Cardiovascular status: stable and blood pressure returned to baseline Anesthetic complications: no       Last Vitals:  Vitals:   04/04/16 1205 04/04/16 1518  BP: 135/88 108/74  Pulse: (!) 105 78  Resp: 18 18  Temp: 36.6 C 36.7 C    Last Pain:  Vitals:   04/04/16 1518  TempSrc:   PainSc: 0-No pain                 Jaliya Siegmann DAVID

## 2016-04-04 NOTE — Discharge Instructions (Signed)
Do not drive or engage in heavy physical activity for 24 hours  You may resume normal activities tomorrow  There is a medical adhesive over the incision. It will begin to peel off in 10-14 days.  You may use the hydrocodone you already have as directed for pain  You may apply an ice pack to the area if needed for pain  You may shower tomorrow  Follow up as scheduled with Dr. Julien Nordmann for chemotherapy  Call 336 [phone] if you develop chest pain, shortness of breath, fever > 101 F or notice excessive swelling or redness around the incision or drainage from the incision.

## 2016-04-05 ENCOUNTER — Encounter (HOSPITAL_COMMUNITY): Payer: Self-pay | Admitting: Thoracic Surgery (Cardiothoracic Vascular Surgery)

## 2016-04-05 ENCOUNTER — Other Ambulatory Visit (HOSPITAL_BASED_OUTPATIENT_CLINIC_OR_DEPARTMENT_OTHER): Payer: Medicare Other

## 2016-04-05 ENCOUNTER — Encounter: Payer: Self-pay | Admitting: Internal Medicine

## 2016-04-05 ENCOUNTER — Other Ambulatory Visit: Payer: Self-pay | Admitting: Hematology and Oncology

## 2016-04-05 ENCOUNTER — Other Ambulatory Visit: Payer: Self-pay

## 2016-04-05 ENCOUNTER — Ambulatory Visit (HOSPITAL_BASED_OUTPATIENT_CLINIC_OR_DEPARTMENT_OTHER): Payer: Medicare Other

## 2016-04-05 VITALS — BP 137/86 | Temp 97.0°F | Resp 16

## 2016-04-05 DIAGNOSIS — C771 Secondary and unspecified malignant neoplasm of intrathoracic lymph nodes: Secondary | ICD-10-CM

## 2016-04-05 DIAGNOSIS — C3491 Malignant neoplasm of unspecified part of right bronchus or lung: Secondary | ICD-10-CM

## 2016-04-05 DIAGNOSIS — Z5111 Encounter for antineoplastic chemotherapy: Secondary | ICD-10-CM | POA: Diagnosis not present

## 2016-04-05 DIAGNOSIS — C784 Secondary malignant neoplasm of small intestine: Secondary | ICD-10-CM | POA: Diagnosis not present

## 2016-04-05 DIAGNOSIS — C3411 Malignant neoplasm of upper lobe, right bronchus or lung: Secondary | ICD-10-CM | POA: Diagnosis not present

## 2016-04-05 LAB — CBC WITH DIFFERENTIAL/PLATELET
BASO%: 0 % (ref 0.0–2.0)
Basophils Absolute: 0 10*3/uL (ref 0.0–0.1)
EOS%: 0 % (ref 0.0–7.0)
Eosinophils Absolute: 0 10*3/uL (ref 0.0–0.5)
HCT: 36.6 % — ABNORMAL LOW (ref 38.4–49.9)
HGB: 11.4 g/dL — ABNORMAL LOW (ref 13.0–17.1)
LYMPH%: 7.1 % — ABNORMAL LOW (ref 14.0–49.0)
MCH: 23.7 pg — ABNORMAL LOW (ref 27.2–33.4)
MCHC: 31.1 g/dL — ABNORMAL LOW (ref 32.0–36.0)
MCV: 75.9 fL — ABNORMAL LOW (ref 79.3–98.0)
MONO#: 0.5 10*3/uL (ref 0.1–0.9)
MONO%: 4.9 % (ref 0.0–14.0)
NEUT#: 8 10*3/uL — ABNORMAL HIGH (ref 1.5–6.5)
NEUT%: 88 % — ABNORMAL HIGH (ref 39.0–75.0)
Platelets: 278 10*3/uL (ref 140–400)
RBC: 4.82 10*6/uL (ref 4.20–5.82)
RDW: 18.5 % — ABNORMAL HIGH (ref 11.0–14.6)
WBC: 9.1 10*3/uL (ref 4.0–10.3)
lymph#: 0.7 10*3/uL — ABNORMAL LOW (ref 0.9–3.3)

## 2016-04-05 LAB — COMPREHENSIVE METABOLIC PANEL
ALT: 24 U/L (ref 0–55)
AST: 19 U/L (ref 5–34)
Albumin: 4.1 g/dL (ref 3.5–5.0)
Alkaline Phosphatase: 76 U/L (ref 40–150)
Anion Gap: 12 mEq/L — ABNORMAL HIGH (ref 3–11)
BUN: 11.8 mg/dL (ref 7.0–26.0)
CO2: 24 mEq/L (ref 22–29)
Calcium: 10 mg/dL (ref 8.4–10.4)
Chloride: 100 mEq/L (ref 98–109)
Creatinine: 0.9 mg/dL (ref 0.7–1.3)
EGFR: >90 mL/min/{1.73_m2} (ref >90)
Glucose: 415 mg/dl — ABNORMAL HIGH (ref 70–140)
Potassium: 4.3 mEq/L (ref 3.5–5.1)
Sodium: 137 mEq/L (ref 136–145)
Total Bilirubin: 0.29 mg/dL (ref 0.20–1.20)
Total Protein: 7.9 g/dL (ref 6.4–8.3)

## 2016-04-05 LAB — GLUCOSE, CAPILLARY: Glucose-Capillary: 100 mg/dL — ABNORMAL HIGH (ref 65–99)

## 2016-04-05 LAB — UA PROTEIN, DIPSTICK - CHCC: Protein, ur: NEGATIVE mg/dL

## 2016-04-05 MED ORDER — PALONOSETRON HCL INJECTION 0.25 MG/5ML
INTRAVENOUS | Status: AC
  Filled 2016-04-05: qty 5

## 2016-04-05 MED ORDER — SODIUM CHLORIDE 0.9% FLUSH
10.0000 mL | INTRAVENOUS | Status: DC | PRN
  Administered 2016-04-05: 10 mL
  Filled 2016-04-05: qty 10

## 2016-04-05 MED ORDER — SODIUM CHLORIDE 0.9 % IV SOLN
Freq: Once | INTRAVENOUS | Status: AC
  Administered 2016-04-05: 15:00:00 via INTRAVENOUS
  Filled 2016-04-05: qty 5

## 2016-04-05 MED ORDER — HEPARIN SOD (PORK) LOCK FLUSH 100 UNIT/ML IV SOLN
500.0000 [IU] | Freq: Once | INTRAVENOUS | Status: AC | PRN
  Administered 2016-04-05: 500 [IU]
  Filled 2016-04-05: qty 5

## 2016-04-05 MED ORDER — SODIUM CHLORIDE 0.9 % IV SOLN
506.5000 mg | Freq: Once | INTRAVENOUS | Status: AC
  Administered 2016-04-05: 510 mg via INTRAVENOUS
  Filled 2016-04-05: qty 51

## 2016-04-05 MED ORDER — SODIUM CHLORIDE 0.9 % IV SOLN
500.0000 mg/m2 | Freq: Once | INTRAVENOUS | Status: AC
  Administered 2016-04-05: 850 mg via INTRAVENOUS
  Filled 2016-04-05: qty 20

## 2016-04-05 MED ORDER — PALONOSETRON HCL INJECTION 0.25 MG/5ML
0.2500 mg | Freq: Once | INTRAVENOUS | Status: AC
  Administered 2016-04-05: 0.25 mg via INTRAVENOUS

## 2016-04-05 MED ORDER — SODIUM CHLORIDE 0.9 % IV SOLN
Freq: Once | INTRAVENOUS | Status: AC
  Administered 2016-04-05: 15:00:00 via INTRAVENOUS

## 2016-04-05 NOTE — Patient Instructions (Signed)
[facility] Discharge Instructions for Patients Receiving Chemotherapy  Today you received the following chemotherapy agents Alimta and Carboplatin   To help prevent nausea and vomiting after your treatment, we encourage you to take your nausea medication as directed.   If you develop nausea and vomiting that is not controlled by your nausea medication, call the clinic.   BELOW ARE SYMPTOMS THAT SHOULD BE REPORTED IMMEDIATELY:  *FEVER GREATER THAN 100.5 F  *CHILLS WITH OR WITHOUT FEVER  NAUSEA AND VOMITING THAT IS NOT CONTROLLED WITH YOUR NAUSEA MEDICATION  *UNUSUAL SHORTNESS OF BREATH  *UNUSUAL BRUISING OR BLEEDING  TENDERNESS IN MOUTH AND THROAT WITH OR WITHOUT PRESENCE OF ULCERS  *URINARY PROBLEMS  *BOWEL PROBLEMS  UNUSUAL RASH Items with * indicate a potential emergency and should be followed up as soon as possible.  Feel free to call the clinic you have any questions or concerns. The clinic phone number is [phone].  Please show the [facility] at check-in to the Emergency Department and triage nurse.   Pemetrexed injection What is this medicine? PEMETREXED (PEM e TREX ed) is a chemotherapy drug used to treat lung cancers like non-small cell lung cancer and mesothelioma. It may also be used to treat other cancers. This medicine may be used for other purposes; ask your health care provider or pharmacist if you have questions. COMMON BRAND NAME(S): Alimta What should I tell my health care provider before I take this medicine? They need to know if you have any of these conditions: -infection (especially a virus infection such as chickenpox, cold sores, or herpes) -kidney disease -low blood counts, like low white cell, platelet, or red cell counts -lung or breathing disease, like asthma -radiation therapy -an unusual or allergic reaction to pemetrexed, other medicines, foods, dyes, or preservative -pregnant or trying to get  pregnant -breast-feeding How should I use this medicine? This drug is given as an infusion into a vein. It is administered in a hospital or clinic by a specially trained health care professional. Talk to your pediatrician regarding the use of this medicine in children. Special care may be needed. Overdosage: If you think you have taken too much of this medicine contact a poison control center or emergency room at once. NOTE: This medicine is only for you. Do not share this medicine with others. What if I miss a dose? It is important not to miss your dose. Call your doctor or health care professional if you are unable to keep an appointment. What may interact with this medicine? This medicine may interact with the following medications: -Ibuprofen This list may not describe all possible interactions. Give your health care provider a list of all the medicines, herbs, non-prescription drugs, or dietary supplements you use. Also tell them if you smoke, drink alcohol, or use illegal drugs. Some items may interact with your medicine. What should I watch for while using this medicine? Visit your doctor for checks on your progress. This drug may make you feel generally unwell. This is not uncommon, as chemotherapy can affect healthy cells as well as cancer cells. Report any side effects. Continue your course of treatment even though you feel ill unless your doctor tells you to stop. In some cases, you may be given additional medicines to help with side effects. Follow all directions for their use. Call your doctor or health care professional for advice if you get a fever, chills or sore throat, or other symptoms of a cold or flu. Do  not treat yourself. This drug decreases your body's ability to fight infections. Try to avoid being around people who are sick. This medicine may increase your risk to bruise or bleed. Call your doctor or health care professional if you notice any unusual bleeding. Be careful  brushing and flossing your teeth or using a toothpick because you may get an infection or bleed more easily. If you have any dental work done, tell your dentist you are receiving this medicine. Avoid taking products that contain aspirin, acetaminophen, ibuprofen, naproxen, or ketoprofen unless instructed by your doctor. These medicines may hide a fever. Call your doctor or health care professional if you get diarrhea or mouth sores. Do not treat yourself. To protect your kidneys, drink water or other fluids as directed while you are taking this medicine. Do not become pregnant while taking this medicine or for 6 months after stopping it. Women should inform their doctor if they wish to become pregnant or think they might be pregnant. Men should not father a child while taking this medicine and for 3 months after stopping it. This may interfere with the ability to father a child. You should talk to your doctor or health care professional if you are concerned about your fertility. There is a potential for serious side effects to an unborn child. Talk to your health care professional or pharmacist for more information. Do not breast-feed an infant while taking this medicine or for 1 week after stopping it. What side effects may I notice from receiving this medicine? Side effects that you should report to your doctor or health care professional as soon as possible: -allergic reactions like skin rash, itching or hives, swelling of the face, lips, or tongue -breathing problems -redness, blistering, peeling or loosening of the skin, including inside the mouth -signs and symptoms of bleeding such as bloody or black, tarry stools; red or dark-brown urine; spitting up blood or brown material that looks like coffee grounds; red spots on the skin; unusual bruising or bleeding from the eye, gums, or nose -signs and symptoms of infection like fever or chills; cough; sore throat; pain or trouble passing urine -signs  and symptoms of kidney injury like trouble passing urine or change in the amount of urine -signs and symptoms of liver injury like dark yellow or brown urine; general ill feeling or flu-like symptoms; light-colored stools; loss of appetite; nausea; right upper belly pain; unusually weak or tired; yellowing of the eyes or skin Side effects that usually do not require medical attention (report to your doctor or health care professional if they continue or are bothersome): -constipation -dizziness -mouth sores -nausea, vomiting -pain, tingling, numbness in the hands or feet -unusually weak or tired This list may not describe all possible side effects. Call your doctor for medical advice about side effects. You may report side effects to FDA at 1-800-FDA-1088. Where should I keep my medicine? This drug is given in a hospital or clinic and will not be stored at home. NOTE: This sheet is a summary. It may not cover all possible information. If you have questions about this medicine, talk to your doctor, pharmacist, or health care provider.  2018 Elsevier/Gold Standard (2015-11-07 18:51:46)  Carboplatin injection What is this medicine? CARBOPLATIN (KAR boe pla tin) is a chemotherapy drug. It targets fast dividing cells, like cancer cells, and causes these cells to die. This medicine is used to treat ovarian cancer and many other cancers. This medicine may be used for other purposes; ask  your health care provider or pharmacist if you have questions. COMMON BRAND NAME(S): Paraplatin What should I tell my health care provider before I take this medicine? They need to know if you have any of these conditions: -blood disorders -hearing problems -kidney disease -recent or ongoing radiation therapy -an unusual or allergic reaction to carboplatin, cisplatin, other chemotherapy, other medicines, foods, dyes, or preservatives -pregnant or trying to get pregnant -breast-feeding How should I use this  medicine? This drug is usually given as an infusion into a vein. It is administered in a hospital or clinic by a specially trained health care professional. Talk to your pediatrician regarding the use of this medicine in children. Special care may be needed. Overdosage: If you think you have taken too much of this medicine contact a poison control center or emergency room at once. NOTE: This medicine is only for you. Do not share this medicine with others. What if I miss a dose? It is important not to miss a dose. Call your doctor or health care professional if you are unable to keep an appointment. What may interact with this medicine? -medicines for seizures -medicines to increase blood counts like filgrastim, pegfilgrastim, sargramostim -some antibiotics like amikacin, gentamicin, neomycin, streptomycin, tobramycin -vaccines Talk to your doctor or health care professional before taking any of these medicines: -acetaminophen -aspirin -ibuprofen -ketoprofen -naproxen This list may not describe all possible interactions. Give your health care provider a list of all the medicines, herbs, non-prescription drugs, or dietary supplements you use. Also tell them if you smoke, drink alcohol, or use illegal drugs. Some items may interact with your medicine. What should I watch for while using this medicine? Your condition will be monitored carefully while you are receiving this medicine. You will need important blood work done while you are taking this medicine. This drug may make you feel generally unwell. This is not uncommon, as chemotherapy can affect healthy cells as well as cancer cells. Report any side effects. Continue your course of treatment even though you feel ill unless your doctor tells you to stop. In some cases, you may be given additional medicines to help with side effects. Follow all directions for their use. Call your doctor or health care professional for advice if you get a  fever, chills or sore throat, or other symptoms of a cold or flu. Do not treat yourself. This drug decreases your body's ability to fight infections. Try to avoid being around people who are sick. This medicine may increase your risk to bruise or bleed. Call your doctor or health care professional if you notice any unusual bleeding. Be careful brushing and flossing your teeth or using a toothpick because you may get an infection or bleed more easily. If you have any dental work done, tell your dentist you are receiving this medicine. Avoid taking products that contain aspirin, acetaminophen, ibuprofen, naproxen, or ketoprofen unless instructed by your doctor. These medicines may hide a fever. Do not become pregnant while taking this medicine. Women should inform their doctor if they wish to become pregnant or think they might be pregnant. There is a potential for serious side effects to an unborn child. Talk to your health care professional or pharmacist for more information. Do not breast-feed an infant while taking this medicine. What side effects may I notice from receiving this medicine? Side effects that you should report to your doctor or health care professional as soon as possible: -allergic reactions like skin rash, itching or hives, swelling   of the face, lips, or tongue -signs of infection - fever or chills, cough, sore throat, pain or difficulty passing urine -signs of decreased platelets or bleeding - bruising, pinpoint red spots on the skin, black, tarry stools, nosebleeds -signs of decreased red blood cells - unusually weak or tired, fainting spells, lightheadedness -breathing problems -changes in hearing -changes in vision -chest pain -high blood pressure -low blood counts - This drug may decrease the number of white blood cells, red blood cells and platelets. You may be at increased risk for infections and bleeding. -nausea and vomiting -pain, swelling, redness or irritation at the  injection site -pain, tingling, numbness in the hands or feet -problems with balance, talking, walking -trouble passing urine or change in the amount of urine Side effects that usually do not require medical attention (report to your doctor or health care professional if they continue or are bothersome): -hair loss -loss of appetite -metallic taste in the mouth or changes in taste This list may not describe all possible side effects. Call your doctor for medical advice about side effects. You may report side effects to FDA at 1-800-FDA-1088. Where should I keep my medicine? This drug is given in a hospital or clinic and will not be stored at home. NOTE: This sheet is a summary. It may not cover all possible information. If you have questions about this medicine, talk to your doctor, pharmacist, or health care provider.  2018 Elsevier/Gold Standard (2007-04-14 14:38:05)

## 2016-04-05 NOTE — Progress Notes (Signed)
Pt is approved for the $400 [facility] grant.  °

## 2016-04-07 ENCOUNTER — Other Ambulatory Visit: Payer: Self-pay | Admitting: Internal Medicine

## 2016-04-07 DIAGNOSIS — K2270 Barrett's esophagus without dysplasia: Secondary | ICD-10-CM

## 2016-04-08 ENCOUNTER — Telehealth: Payer: Self-pay | Admitting: Medical Oncology

## 2016-04-08 ENCOUNTER — Other Ambulatory Visit: Payer: Self-pay | Admitting: Medical Oncology

## 2016-04-08 ENCOUNTER — Other Ambulatory Visit: Payer: Self-pay | Admitting: Internal Medicine

## 2016-04-08 ENCOUNTER — Telehealth: Payer: Self-pay | Admitting: *Deleted

## 2016-04-08 DIAGNOSIS — R066 Hiccough: Secondary | ICD-10-CM

## 2016-04-08 MED ORDER — CHLORPROMAZINE HCL 25 MG PO TABS
25.0000 mg | ORAL_TABLET | Freq: Three times a day (TID) | ORAL | 0 refills | Status: DC | PRN
Start: 2016-04-08 — End: 2016-04-08

## 2016-04-08 NOTE — Telephone Encounter (Signed)
Had a few "hiccoughs while he was getting chemo and they have persisted through today and kept him from sleeping.Note to [place],

## 2016-04-08 NOTE — Telephone Encounter (Signed)
Oncology Nurse Navigator Documentation  Oncology Nurse Navigator Flowsheets 04/08/2016  Navigator Location [facility]  Navigator Encounter Type Telephone/I received a message from financial advocate regarding Mr. Shepherd.  He is having trouble paying for transportation/gas.  I called and spoke with his wife.  She states they are having trouble paying for gas.  I updated her that when they are here this Thursday, there are 2 applications and other information to help. I have public transportation from [place] to [place] information and [employer] roads to recovery.  I also have a gas card I can give them on Thursday.  She was thankful for the help.   Telephone Outgoing Call  Barriers/Navigation Needs Financial  Interventions Other  Support Groups/Services [employer]  Acuity Level 2  Acuity Level 2 Other  Time Spent with Patient 53

## 2016-04-09 ENCOUNTER — Other Ambulatory Visit: Payer: Self-pay

## 2016-04-09 ENCOUNTER — Ambulatory Visit: Payer: Self-pay | Admitting: Thoracic Surgery (Cardiothoracic Vascular Surgery)

## 2016-04-10 ENCOUNTER — Telehealth: Payer: Self-pay | Admitting: *Deleted

## 2016-04-10 NOTE — Telephone Encounter (Signed)
"  I'm trying to reach Estate manager/land agent.  Her extension is not allowing me to leave a message.  They came out today to turn off the water.  I told them to wait because we're getting held with this.  Could they call [facility] with [employer] to verify the help and how much assistance will be provided.  Thanks."  Call transferred successfully to ext [phone].

## 2016-04-11 ENCOUNTER — Ambulatory Visit (HOSPITAL_BASED_OUTPATIENT_CLINIC_OR_DEPARTMENT_OTHER): Payer: Medicare Other | Admitting: Internal Medicine

## 2016-04-11 ENCOUNTER — Ambulatory Visit: Payer: Self-pay | Admitting: Nurse Practitioner

## 2016-04-11 ENCOUNTER — Encounter: Payer: Self-pay | Admitting: *Deleted

## 2016-04-11 ENCOUNTER — Encounter: Payer: Self-pay | Admitting: Internal Medicine

## 2016-04-11 ENCOUNTER — Other Ambulatory Visit (HOSPITAL_BASED_OUTPATIENT_CLINIC_OR_DEPARTMENT_OTHER): Payer: Medicare Other

## 2016-04-11 ENCOUNTER — Other Ambulatory Visit: Payer: Self-pay | Admitting: Internal Medicine

## 2016-04-11 VITALS — BP 111/79 | HR 85 | Temp 98.2°F | Resp 18 | Ht 67.0 in | Wt 125.9 lb

## 2016-04-11 DIAGNOSIS — C3491 Malignant neoplasm of unspecified part of right bronchus or lung: Secondary | ICD-10-CM

## 2016-04-11 DIAGNOSIS — E43 Unspecified severe protein-calorie malnutrition: Secondary | ICD-10-CM

## 2016-04-11 DIAGNOSIS — R000 Tachycardia, unspecified: Secondary | ICD-10-CM

## 2016-04-11 DIAGNOSIS — E860 Dehydration: Secondary | ICD-10-CM | POA: Insufficient documentation

## 2016-04-11 DIAGNOSIS — D509 Iron deficiency anemia, unspecified: Secondary | ICD-10-CM

## 2016-04-11 DIAGNOSIS — C3411 Malignant neoplasm of upper lobe, right bronchus or lung: Secondary | ICD-10-CM

## 2016-04-11 DIAGNOSIS — R599 Enlarged lymph nodes, unspecified: Secondary | ICD-10-CM

## 2016-04-11 DIAGNOSIS — E119 Type 2 diabetes mellitus without complications: Secondary | ICD-10-CM | POA: Diagnosis not present

## 2016-04-11 DIAGNOSIS — R066 Hiccough: Secondary | ICD-10-CM

## 2016-04-11 DIAGNOSIS — Z5111 Encounter for antineoplastic chemotherapy: Secondary | ICD-10-CM

## 2016-04-11 HISTORY — DX: Hiccough: R06.6

## 2016-04-11 LAB — COMPREHENSIVE METABOLIC PANEL
ALT: 26 U/L (ref 0–55)
AST: 21 U/L (ref 5–34)
Albumin: 3.9 g/dL (ref 3.5–5.0)
Alkaline Phosphatase: 75 U/L (ref 40–150)
Anion Gap: 11 mEq/L (ref 3–11)
BUN: 17.7 mg/dL (ref 7.0–26.0)
CO2: 27 mEq/L (ref 22–29)
Calcium: 10.6 mg/dL — ABNORMAL HIGH (ref 8.4–10.4)
Chloride: 96 mEq/L — ABNORMAL LOW (ref 98–109)
Creatinine: 0.8 mg/dL (ref 0.7–1.3)
EGFR: >90 mL/min/{1.73_m2} (ref >90)
Glucose: 225 mg/dl — ABNORMAL HIGH (ref 70–140)
Potassium: 4.2 mEq/L (ref 3.5–5.1)
Sodium: 134 mEq/L — ABNORMAL LOW (ref 136–145)
Total Bilirubin: 0.64 mg/dL (ref 0.20–1.20)
Total Protein: 7.1 g/dL (ref 6.4–8.3)

## 2016-04-11 LAB — CBC WITH DIFFERENTIAL/PLATELET
BASO%: 0.2 % (ref 0.0–2.0)
Basophils Absolute: 0 10*3/uL (ref 0.0–0.1)
EOS%: 2.2 % (ref 0.0–7.0)
Eosinophils Absolute: 0.1 10*3/uL (ref 0.0–0.5)
HCT: 38.4 % (ref 38.4–49.9)
HGB: 12 g/dL — ABNORMAL LOW (ref 13.0–17.1)
LYMPH%: 15.7 % (ref 14.0–49.0)
MCH: 23.3 pg — ABNORMAL LOW (ref 27.2–33.4)
MCHC: 31.3 g/dL — ABNORMAL LOW (ref 32.0–36.0)
MCV: 74.4 fL — ABNORMAL LOW (ref 79.3–98.0)
MONO#: 0.1 10*3/uL (ref 0.1–0.9)
MONO%: 1.8 % (ref 0.0–14.0)
NEUT#: 4.4 10*3/uL (ref 1.5–6.5)
NEUT%: 80.1 % — ABNORMAL HIGH (ref 39.0–75.0)
Platelets: 221 10*3/uL (ref 140–400)
RBC: 5.16 10*6/uL (ref 4.20–5.82)
RDW: 18.4 % — ABNORMAL HIGH (ref 11.0–14.6)
WBC: 5.5 10*3/uL (ref 4.0–10.3)
lymph#: 0.9 10*3/uL (ref 0.9–3.3)

## 2016-04-11 MED ORDER — SODIUM CHLORIDE 0.9 % IV SOLN
Freq: Once | INTRAVENOUS | Status: AC
  Administered 2016-04-11: 12:00:00 via INTRAVENOUS

## 2016-04-11 MED ORDER — SODIUM CHLORIDE 0.9% FLUSH
10.0000 mL | Freq: Once | INTRAVENOUS | Status: AC
  Administered 2016-04-11: 10 mL via INTRAVENOUS
  Filled 2016-04-11: qty 10

## 2016-04-11 MED ORDER — BACLOFEN 10 MG PO TABS
10.0000 mg | ORAL_TABLET | Freq: Once | ORAL | Status: AC
  Administered 2016-04-11: 10 mg via ORAL
  Filled 2016-04-11: qty 1

## 2016-04-11 MED ORDER — BACLOFEN 10 MG PO TABS: 10.0000 mg | ORAL_TABLET | Freq: Three times a day (TID) | ORAL | 0 refills | Status: DC

## 2016-04-11 MED ORDER — HEPARIN SOD (PORK) LOCK FLUSH 100 UNIT/ML IV SOLN
500.0000 [IU] | Freq: Once | INTRAVENOUS | Status: AC
  Administered 2016-04-11: 500 [IU] via INTRAVENOUS
  Filled 2016-04-11: qty 5

## 2016-04-11 NOTE — Progress Notes (Signed)
[facility] [phone]   [phone]  OFFICE PROGRESS NOTE  [name], MD [address] [place] [address]  DIAGNOSIS: Stage IV (T1b, N0, M1b) non-small cell lung cancer, adenocarcinoma presented with right upper lobe lung nodule and recent metastasis to the small intestine. This was initially diagnosed in September 2017.  Genomic Alterations Identified? ERBB2 amplification - equivocal? CDKN2A p16INK4a E88* and p14ARF M355H SMARCA4 splice site [id number]>WO SPTA1 E2022* TOP2A amplification TP53 A159P Additional Findings? Microsatellite status MS-Stable Tumor Mutation Burden TMB-Intermediate; 18 Muts/Mb Additional Disease-relevant Genes with No Reportable Alterations Identified? EGFR KRAS ALK BRAF MET RET ROS1   PRIOR THERAPY:  1) Status post right VATS with right upper lobectomy and mediastinal lymph node dissection under the care of Dr. Roxan Hockey on 10/18/2015 and the final pathology was consistent with stage IA (T1b, N0, MX). 2) upper endoscopy on 01/05/2016 showed normal esophagus, normal stomach but there was occasional mass around 3.0 CM in length circumferential nonobstructing in the jejunum. The final pathology was consistent with metastatic adenocarcinoma. 3) status post laparoscopic laparotomy and resection of proximal lesion and and distal jejunum/proximal ileum under the care of Dr. Hassell Done 1 02/27/2016.  CURRENT THERAPY: Systemic chemotherapy with carboplatin for AUC of 5, Alimta 500 MG/M2 and Avastin 15 MG/KG every 3 weeks. First dose 04/04/2016. We will hold the Avastin for the first cycle because of his recent bowel surgery.  INTERVAL HISTORY: KYON BENTLER 56 y.o. male returns to the clinic today for follow-up visit accompanied by his wife. The patient is started systemic chemotherapy with carboplatin and Alimta last week and tolerated the first few days of his treatment well.-3 days after his treatment he  started having hiccups that was getting worse. He was started on Thorazine but not improvement in his hiccups. He has poor by mouth intake over the last few days and last around 8 pounds. He has no chest pain, shortness of breath, cough or hemoptysis. He has no fever or chills. He denied having any nausea or vomiting. He had repeat CT scan of the chest, abdomen and pelvis as well as MRI of the brain before starting the first dose of his chemotherapy. He is here today for evaluation and management of the adverse effect of his treatment.   MEDICAL HISTORY: Past Medical History:  Diagnosis Date  . Anemia 11/28/2015  . Anxiety   . Arthritis    "hands, back" (11/28/2015)  . Barrett's esophagus 07/01/2013   Without dysplasia on biopsy 09/03/2012. Repeat EGD recommended 08/2015  . Carotid artery stenosis 07/01/2013   Requiring right sided stent   . Chronic pain syndrome 07/01/2013  . Closed head injury with brief loss of consciousness ([place]) 07/22/2010   Head trapped in a hydraulic device at work.  Fracture of orbital bones on right and brief loss of consciousness per report.  . 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 There was likely a strong psycho-social overlay in regard to the cognitive deficits in the form of mood disorder with psychotic features and mixed anxiety symptomatology. His primary tested cognitive deficits are in the areas of attention, executi  . Daily headache "since 07/2010"   constantly  . Degenerative joint disease of cervical spine 07/01/2013  . Dupuytren's contracture of both hands 04/08/2014  . Encounter for antineoplastic chemotherapy 10/05/2015  . Erectile dysfunction associated with type 2  diabetes mellitus ([place]) 07/01/2013  . Fibromyalgia 07/01/2013  . Goals of care, counseling/discussion 03/28/2016  . History of blood transfusion 11/28/2015     "suppose to get his first today" (11/28/2015)  . Hyperlipidemia LDL goal < 100 07/01/2013  . 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 Memory changes    "memory issues" from head injury  . Osteoarthritis of right thumb 10/21/2014  . Peripheral vascular occlusive disease ([place]) 07/01/2013   Requiring 2 arterial stents above the left knee per report  . Pneumonia ~ 2006/2007  . Post traumatic stress disorder 07/01/2013  . Primary lung adenocarcinoma ([place]) dx'd 08/2015   "right lung"  . Severe major depression with psychotic features ([place]) 04/15/2011  . Tobacco abuse 07/01/2013  . Tobacco abuse   . Type 2 diabetes mellitus with vascular disease ([place]) 07/01/2013   Left lower extremity and right carotid stenting    ALLERGIES:  is allergic to gabapentin; lyrica [pregabalin]; and celebrex [celecoxib].  MEDICATIONS:  Current Outpatient Prescriptions  Medication Sig Dispense Refill  . aspirin EC 81 MG tablet Take 81 mg by mouth daily.     Marland Kitchen atorvastatin (LIPITOR) 40 MG tablet Take 1 tablet (40 mg total) by mouth daily. (Patient taking differently: Take 40 mg by mouth every evening. ) 90 tablet 3  . chlorproMAZINE (THORAZINE) 25 MG tablet TAKE 1 TABLET BY MOUTH THREE TIMES DAILY AS NEEDED 270 tablet 0  . clopidogrel (PLAVIX) 75 MG tablet Take 75 mg by mouth at bedtime.     Marland Kitchen dexamethasone (DECADRON) 4 MG tablet Take 1 tablet (4 mg total) by mouth 2 (two) times daily with a meal. 40 tablet 1  . ferrous sulfate 325 (65 FE) MG tablet Take 1 tablet (325 mg total) by mouth 2 (two) times daily with a meal. 60 tablet 3  . folic acid (FOLVITE) 1 MG tablet Take 1 tablet (1 mg total) by mouth daily. 30 tablet 4  . glipiZIDE (GLUCOTROL) 10 MG tablet Take 2 tablets (20 mg total) by mouth 2 (two) times daily before a meal. 360 tablet 3  . HYDROcodone-acetaminophen (NORCO) 10-325 MG tablet Take 1 tablet by mouth every 8 (eight) hours as needed for severe pain. 90 tablet 0  . lidocaine-prilocaine (EMLA) cream Apply  1 application topically as needed. 30 g 0  . lisinopril (PRINIVIL,ZESTRIL) 5 MG tablet Take 0.5 tablets (2.5 mg total) by mouth daily. 90 tablet 3  . metFORMIN (GLUCOPHAGE) 500 MG tablet Take 1 tablet (500 mg total) by mouth 2 (two) times daily with a meal. 180 tablet 3  . pantoprazole (PROTONIX) 40 MG tablet TAKE 1 TABLET(40 MG) BY MOUTH DAILY 90 tablet 0  . prochlorperazine (COMPAZINE) 10 MG tablet Take 1 tablet (10 mg total) by mouth every 6 (six) hours as needed for nausea or vomiting. 30 tablet 0  . sitaGLIPtin (JANUVIA) 100 MG tablet Take 1 tablet (100 mg total) by mouth daily. (Patient taking differently: Take 100 mg by mouth at bedtime. ) 90 tablet 3   No current facility-administered medications for this visit.    Facility-Administered Medications Ordered in Other Visits  Medication Dose Route Frequency Provider Last Rate Last Dose  . sodium chloride flush (NS) 0.9 % injection 10 mL  10 mL Intracatheter PRN Nicholas Lose, MD   10 mL at 04/05/16 1641    SURGICAL HISTORY:  Past Surgical History:  Procedure Laterality Date  . CAROTID STENT Right ?2014  . COLONOSCOPY N/A 11/30/2015   Procedure: COLONOSCOPY;  Surgeon: Teena Irani, MD;  Location: [facility] ENDOSCOPY;  Service: Endoscopy;  Laterality: N/A;  . ESOPHAGOGASTRODUODENOSCOPY N/A 11/30/2015   Procedure: ESOPHAGOGASTRODUODENOSCOPY (EGD);  Surgeon: Teena Irani, MD;  Location[facility] ENDOSCOPY;  Service: Endoscopy;  Laterality: N/A;  . ESOPHAGOGASTRODUODENOSCOPY (EGD) WITH PROPOFOL N/A 01/05/2016   Procedure: ESOPHAGOGASTRODUODENOSCOPY (EGD) WITH PROPOFOL;  Surgeon: Teena Irani, MD;  Location: [facility] ENDOSCOPY;  Service: Endoscopy;  Laterality: N/A;  . FEMORAL ARTERY STENT Left 05/2012; ~ 2015   [facility] Endo 06/04/2012; Raechel Chute report  . FRACTURE SURGERY    . GIVENS CAPSULE STUDY N/A 12/22/2015   Procedure: GIVENS CAPSULE STUDY;  Surgeon: Wonda Horner, MD;  Location: [facility] ENDOSCOPY;  Service: Endoscopy;  Laterality: N/A;  . HARDWARE REMOVAL Right 11/15/2011    Removal of deep frontozygomatic orbital hardware/notes 11/15/2011  . HERNIA REPAIR  [id number]   Umbilical  . LAPAROSCOPY N/A 02/27/2016   Procedure: LAPAROSCOPY, LAPAROTOMY  WITH TWO SMALL BOWEL RESECTION;  Surgeon: Johnathan Hausen, MD;  Location: [facility] ORS;  Service: General;  Laterality: N/A;  . ORIF ORBITAL FRACTURE Right 08/15/2010    caught in a hydraulic machine; open reduction internal fixation of orbital rim fracture and open reduction of zygomatic arch fracture  Ar[facility] Endo 10/13/2009  . PORTACATH PLACEMENT Left 04/04/2016   Procedure: INSERTION PORT-A-CATH LEFT CHEST;  Surgeon: Melrose Nakayama, MD;  Location: [place] [facility];  Service: Thoracic;  Laterality: Left;  Marland Kitchen VIDEO ASSISTED THORACOSCOPY (VATS)/ LOBECTOMY Right 10/18/2015   Procedure: VIDEO ASSISTED THORACOSCOPY (VATS)/ LOBECTOMY;  Surgeon: Melrose Nakayama, MD;  Location: [facility];  Service: Thoracic;  Laterality: Right;  Marland Kitchen VIDEO BRONCHOSCOPY Bilateral 09/21/2015   Procedure: VIDEO BRONCHOSCOPY WITH FLUORO;  Surgeon: Juanito Doom, MD;  Location: [facility] ENDOSCOPY;  Service: Cardiopulmonary;  Laterality: Bilateral;    REVIEW OF SYSTEMS:  Constitutional: positive for fatigue Eyes: negative Ears, nose, mouth, throat, and face: negative Respiratory: negative Cardiovascular: negative Gastrointestinal: positive for Hiccups Genitourinary:negative Integument/breast: negative Hematologic/lymphatic: negative Musculoskeletal:negative Neurological: negative Behavioral/Psych: negative Endocrine: negative Allergic/Immunologic: negative   PHYSICAL EXAMINATION: General appearance: alert, cooperative, fatigued and no distress Head: Normocephalic, without obvious abnormality, atraumatic Neck: no adenopathy, no JVD, supple, symmetrical, trachea midline and thyroid not enlarged, symmetric, no tenderness/mass/nodules Lymph nodes: Cervical, supraclavicular, and axillary nodes normal. Resp: clear to auscultation bilaterally Back: symmetric, no curvature.  ROM normal. No CVA tenderness. Cardio: regular rate and rhythm, S1, S2 normal, no murmur, click, rub or gallop GI: soft, non-tender; bowel sounds normal; no masses,  no organomegaly Extremities: extremities normal, atraumatic, no cyanosis or edema Neurologic: Alert and oriented X 3, normal strength and tone. Normal symmetric reflexes. Normal coordination and gait  ECOG PERFORMANCE STATUS: 1 - Symptomatic but completely ambulatory  Blood pressure 123/86, pulse (!) 124, temperature 98.2 F (36.8 C), temperature source Oral, resp. rate 19, height '5\' 7"'$  (1.702 m), weight 125 lb 14.4 oz (57.1 kg), SpO2 100 %.  LABORATORY DATA: Lab Results  Component Value Date   WBC 5.5 04/11/2016   HGB 12.0 (L) 04/11/2016   HCT 38.4 04/11/2016   MCV 74.4 (L) 04/11/2016   PLT 221 04/11/2016      Chemistry      Component Value Date/Time   NA 134 (L) 04/11/2016 1040   K 4.2 04/11/2016 1040   CL 103 04/04/2016 1206   CO2 27 04/11/2016 1040   BUN 17.7 04/11/2016 1040   CREATININE 0.8 04/11/2016 1040      Component Value Date/Time   CALCIUM 10.6 (H) 04/11/2016 1040   ALKPHOS 75 04/11/2016 1040   AST 21 04/11/2016 1040  ALT 26 04/11/2016 1040   BILITOT 0.64 04/11/2016 1040       RADIOGRAPHIC STUDIES: Dg Chest 2 View  Result Date: 04/04/2016 CLINICAL DATA:  PRE-OP FOR PORTA-CATH INSERTION.HX Non-small cell lung cancer.COUGH. EXAM: CHEST  2 VIEW COMPARISON:  CT, 04/03/2016. FINDINGS: The cardiac silhouette is normal in size and configuration. No mediastinal or hilar masses. No evidence of adenopathy. There pulmonary anastomosis staple lines along the medial and lateral right mid lung, stable. Lungs are otherwise clear. No pleural effusion or pneumothorax. Skeletal structures are intact. IMPRESSION: No active cardiopulmonary disease. Electronically Signed   By: Lajean Manes M.D.   On: 04/04/2016 12:09   Ct Chest W Contrast  Result Date: 04/03/2016 CLINICAL DATA:  Lung cancer. Non-small cell lung  cancer. Chemotherapy ongoing. EXAM: CT CHEST, ABDOMEN, AND PELVIS WITH CONTRAST TECHNIQUE: Multidetector CT imaging of the chest, abdomen and pelvis was performed following the standard protocol during bolus administration of intravenous contrast. CONTRAST:  169m ISOVUE-300 IOPAMIDOL (ISOVUE-300) INJECTION 61% COMPARISON:  PET-CT 10/03/2015 FINDINGS: CT CHEST FINDINGS Cardiovascular: No significant vascular findings. Normal heart size. No pericardial effusion. Mediastinum/Nodes: No axillary or supraclavicular adenopathy. No mediastinal hilar adenopathy. Lungs/Pleura: Postsurgical changes in LEFT upper lobe remnant lobe. No new nodularity Musculoskeletal: No aggressive osseous lesion. CT ABDOMEN AND PELVIS FINDINGS Hepatobiliary: No focal hepatic lesion. No biliary ductal dilatation. Gallbladder is normal. Common bile duct is normal. Pancreas: Pancreas is normal. No ductal dilatation. No pancreatic inflammation. Spleen: Normal spleen Adrenals/urinary tract: Adrenal glands and kidneys are normal. The ureters and bladder normal. Stomach/Bowel: Stomach and duodenum normal. Interval partial small bowel are section. No obstruction of the small bowel. No small bowel mass identified. The colon and rectosigmoid colon are normal. Vascular/Lymphatic:  Calcification abdominal aorta. There are newly enlarged periaortic retroperitoneal lymph nodes. For example lymph node enlarged 13 mm LEFT of the SMA (image 67, series 2) with central low-attenuation consists with necrosis. Similar lymph node LEFT adjacent aorta at the level of the LEFT renal vein measures 13 mm (image 73, series 2). Approximately 6 of these lymph nodes. Additional enlarged lymph node within the central mesenteries. For example 14 mm lymph node in the mid abdomen (image 71, series 2). No pelvic lymphadenopathy.  No inguinal adenopathy. Reproductive: Prostate normal Other: No peritoneal or omental nodularity Musculoskeletal: No aggressive osseous lesion.  IMPRESSION: Chest Impression: 1. No evidence of recurrence or metastasis in thorax. 2. Postsurgical change in the RIGHT upper lobe. Abdomen / Pelvis Impression: 1. New small necrotic lymph nodes in the retroperitoneum clustered about the proximal aorta consistent with metastatic disease. 2. Small abnormal lymph node within the the central small bowel mesentery is similar. 3. No lesion of the bowel itself identified.  No bowel obstruction. Electronically Signed   By: SSuzy BouchardM.D.   On: 04/03/2016 20:37   Mr BJeri CosWCWContrast  Result Date: 04/03/2016 CLINICAL DATA:  Initial evaluation for primary adenocarcinoma of right lung. EXAM: MRI HEAD WITHOUT AND WITH CONTRAST TECHNIQUE: Multiplanar, multiecho pulse sequences of the brain and surrounding structures were obtained without and with intravenous contrast. CONTRAST:  166mMULTIHANCE GADOBENATE DIMEGLUMINE 529 MG/ML IV SOLN COMPARISON:  Prior brain MRI from 10/17/2015. FINDINGS: Brain: Cerebral volume within normal limits for age. No significant cerebral white matter disease. No focal parenchymal signal abnormality. No abnormal foci of restricted diffusion to suggest acute or subacute ischemia. Gray-white matter differentiation maintained. No encephalomalacia to suggest chronic infarction. No foci of susceptibility artifact to suggest acute or chronic intracranial hemorrhage. No  mass lesion, midline shift or mass effect. No hydrocephalus. No extra-axial fluid collection. Major dural sinuses are grossly patent. No abnormal enhancement. No evidence for intracranial metastatic disease. Pituitary gland and suprasellar region within normal limits. Vascular: Major intracranial vascular flow voids maintained. Skull and upper cervical spine: Cerebellar tonsils mildly low lying within the foramen magnum without frank Chiari malformation. Craniocervical junction otherwise unremarkable. Visualized upper cervical spine within normal limits. Bone marrow signal  intensity normal. No scalp soft tissue abnormality. Sinuses/Orbits: Globes and orbital soft tissues within normal limits. Mild scattered mucosal thickening within the ethmoidal air cells and maxillary sinuses. Paranasal sinuses are otherwise clear. Trace opacity left mastoid air cells. Mastoids otherwise clear. Inner ear structures normal. Other: No other significant finding. IMPRESSION: No evidence for intracranial metastatic disease. Electronically Signed   By: Jeannine Boga M.D.   On: 04/03/2016 20:18   Ct Abdomen Pelvis W Contrast  Result Date: 04/03/2016 CLINICAL DATA:  Lung cancer. Non-small cell lung cancer. Chemotherapy ongoing. EXAM: CT CHEST, ABDOMEN, AND PELVIS WITH CONTRAST TECHNIQUE: Multidetector CT imaging of the chest, abdomen and pelvis was performed following the standard protocol during bolus administration of intravenous contrast. CONTRAST:  125m ISOVUE-300 IOPAMIDOL (ISOVUE-300) INJECTION 61% COMPARISON:  PET-CT 10/03/2015 FINDINGS: CT CHEST FINDINGS Cardiovascular: No significant vascular findings. Normal heart size. No pericardial effusion. Mediastinum/Nodes: No axillary or supraclavicular adenopathy. No mediastinal hilar adenopathy. Lungs/Pleura: Postsurgical changes in LEFT upper lobe remnant lobe. No new nodularity Musculoskeletal: No aggressive osseous lesion. CT ABDOMEN AND PELVIS FINDINGS Hepatobiliary: No focal hepatic lesion. No biliary ductal dilatation. Gallbladder is normal. Common bile duct is normal. Pancreas: Pancreas is normal. No ductal dilatation. No pancreatic inflammation. Spleen: Normal spleen Adrenals/urinary tract: Adrenal glands and kidneys are normal. The ureters and bladder normal. Stomach/Bowel: Stomach and duodenum normal. Interval partial small bowel are section. No obstruction of the small bowel. No small bowel mass identified. The colon and rectosigmoid colon are normal. Vascular/Lymphatic:  Calcification abdominal aorta. There are newly enlarged  periaortic retroperitoneal lymph nodes. For example lymph node enlarged 13 mm LEFT of the SMA (image 67, series 2) with central low-attenuation consists with necrosis. Similar lymph node LEFT adjacent aorta at the level of the LEFT renal vein measures 13 mm (image 73, series 2). Approximately 6 of these lymph nodes. Additional enlarged lymph node within the central mesenteries. For example 14 mm lymph node in the mid abdomen (image 71, series 2). No pelvic lymphadenopathy.  No inguinal adenopathy. Reproductive: Prostate normal Other: No peritoneal or omental nodularity Musculoskeletal: No aggressive osseous lesion. IMPRESSION: Chest Impression: 1. No evidence of recurrence or metastasis in thorax. 2. Postsurgical change in the RIGHT upper lobe. Abdomen / Pelvis Impression: 1. New small necrotic lymph nodes in the retroperitoneum clustered about the proximal aorta consistent with metastatic disease. 2. Small abnormal lymph node within the the central small bowel mesentery is similar. 3. No lesion of the bowel itself identified.  No bowel obstruction. Electronically Signed   By: SSuzy BouchardM.D.   On: 04/03/2016 20:37   Dg Fluoro Guide Cv Line-no Report  Result Date: 04/04/2016 Fluoroscopy was utilized by the requesting physician.  No radiographic interpretation.     ASSESSMENT AND PLAN:  This is a very pleasant 56years old white male with metastatic non-small cell lung cancer, adenocarcinoma. He is currently undergoing systemic chemotherapy with carboplatin and Alimta status post 1 cycle. He tolerated the first few days of the treatment well but now has intractable hiccups with poor by mouth intake  and weight loss. For the hiccups, I will order baclofen 10 mg by mouth 3 times a day as needed. For dehydration and tachycardia, I will order IV fluid with normal saline 1 L today. I also discussed with the patient the results of the recent CT scan of the chest, abdomen and pelvis as well as brain MRI.  Unfortunately there is evidence of new small necrotic lymph nodes in the retroperitoneum questionable for metastatic disease after his surgical resection. This scan will serve as a baseline before starting his treatment. The patient would come back for follow-up visit in 2 weeks for evaluation before starting cycle #2 of his chemotherapy. For the iron deficiency anemia, he will continue his oral iron tablets. For the history of diabetes mellitus the patient is currently on treatment with metformin, Januvia and Glucotrol by his primary care physician. The patient voices understanding of current disease status and treatment options and is in agreement with the current care plan.  All questions were answered. The patient knows to call the clinic with any problems, questions or concerns. We can certainly see the patient much sooner if necessary.  Disclaimer: This note was dictated with voice recognition software. Similar sounding words can inadvertently be transcribed and may not be corrected upon review.

## 2016-04-11 NOTE — Progress Notes (Signed)
Oncology Nurse Navigator Documentation  Oncology Nurse Navigator Flowsheets 04/11/2016  Navigator Location [facility]  Navigator Encounter Type Clinic/MDC/I spoke with Brett [name] today. They needed assistance with gas cards and transportation.  I gave them information on public transportation in his area.  I also gave them 2 25.00 dollar gas cards.  I helped them complete 2 applications for additional help paying for gas.  Lung cancer Initiative and [place].  I will fax lung cancer initiative application.  For the [place], I will call Ms. Gioffre due to needing proof of income.  They were so thankful for the help.    Patient Visit Type MedOnc  Treatment Phase Treatment  Barriers/Navigation Needs Financial  Interventions Other  Acuity Level 3  Acuity Level 3 Other  Time Spent with Patient 45

## 2016-04-11 NOTE — Addendum Note (Signed)
Addended by: Aura Fey A on: 04/11/2016 12:08 PM   Modules accepted: Orders

## 2016-04-11 NOTE — Addendum Note (Signed)
Addended by: Aura Fey A on: 04/11/2016 12:20 PM   Modules accepted: Orders

## 2016-04-11 NOTE — Patient Instructions (Signed)
Dehydration, Adult Dehydration is a condition in which there is not enough fluid or water in the body. This happens when you lose more fluids than you take in. Important organs, such as the kidneys, brain, and heart, cannot function without a proper amount of fluids. Any loss of fluids from the body can lead to dehydration. Dehydration can range from mild to severe. This condition should be treated right away to prevent it from becoming severe. What are the causes? This condition may be caused by:  Vomiting.  Diarrhea.  Excessive sweating, such as from heat exposure or exercise.  Not drinking enough fluid, especially:  When ill.  While doing activity that requires a lot of energy.  Excessive urination.  Fever.  Infection.  Certain medicines, such as medicines that cause the body to lose excess fluid (diuretics).  Inability to access safe drinking water.  Reduced physical ability to get adequate water and food. What increases the risk? This condition is more likely to develop in people:  Who have a poorly controlled long-term (chronic) illness, such as diabetes, heart disease, or kidney disease.  Who are age 65 or older.  Who are disabled.  Who live in a place with high altitude.  Who play endurance sports. What are the signs or symptoms? Symptoms of mild dehydration may include:   Thirst.  Dry lips.  Slightly dry mouth.  Dry, warm skin.  Dizziness. Symptoms of moderate dehydration may include:   Very dry mouth.  Muscle cramps.  Dark urine. Urine may be the color of tea.  Decreased urine production.  Decreased tear production.  Heartbeat that is irregular or faster than normal (palpitations).  Headache.  Light-headedness, especially when you stand up from a sitting position.  Fainting (syncope). Symptoms of severe dehydration may include:   Changes in skin, such as:  Cold and clammy skin.  Blotchy (mottled) or pale skin.  Skin that does  not quickly return to normal after being lightly pinched and released (poor skin turgor).  Changes in body fluids, such as:  Extreme thirst.  No tear production.  Inability to sweat when body temperature is high, such as in hot weather.  Very little urine production.  Changes in vital signs, such as:  Weak pulse.  Pulse that is more than 100 beats a minute when sitting still.  Rapid breathing.  Low blood pressure.  Other changes, such as:  Sunken eyes.  Cold hands and feet.  Confusion.  Lack of energy (lethargy).  Difficulty waking up from sleep.  Short-term weight loss.  Unconsciousness. How is this diagnosed? This condition is diagnosed based on your symptoms and a physical exam. Blood and urine tests may be done to help confirm the diagnosis. How is this treated? Treatment for this condition depends on the severity. Mild or moderate dehydration can often be treated at home. Treatment should be started right away. Do not wait until dehydration becomes severe. Severe dehydration is an emergency and it needs to be treated in a hospital. Treatment for mild dehydration may include:   Drinking more fluids.  Replacing salts and minerals in your blood (electrolytes) that you may have lost. Treatment for moderate dehydration may include:   Drinking an oral rehydration solution (ORS). This is a drink that helps you replace fluids and electrolytes (rehydrate). It can be found at pharmacies and retail stores. Treatment for severe dehydration may include:   Receiving fluids through an IV tube.  Receiving an electrolyte solution through a feeding tube that is   passed through your nose and into your stomach (nasogastric tube, or NG tube).  Correcting any abnormalities in electrolytes.  Treating the underlying cause of dehydration. Follow these instructions at home:  If directed by your health care provider, drink an ORS:  Make an ORS by following instructions on the  package.  Start by drinking small amounts, about  cup (120 mL) every 5-10 minutes.  Slowly increase how much you drink until you have taken the amount recommended by your health care provider.  Drink enough clear fluid to keep your urine clear or pale yellow. If you were told to drink an ORS, finish the ORS first, then start slowly drinking other clear fluids. Drink fluids such as:  Water. Do not drink only water. Doing that can lead to having too little salt (sodium) in the body (hyponatremia).  Ice chips.  Fruit juice that you have added water to (diluted fruit juice).  Low-calorie sports drinks.  Avoid:  Alcohol.  Drinks that contain a lot of sugar. These include high-calorie sports drinks, fruit juice that is not diluted, and soda.  Caffeine.  Foods that are greasy or contain a lot of fat or sugar.  Take over-the-counter and prescription medicines only as told by your health care provider.  Do not take sodium tablets. This can lead to having too much sodium in the body (hypernatremia).  Eat foods that contain a healthy balance of electrolytes, such as bananas, oranges, potatoes, tomatoes, and spinach.  Keep all follow-up visits as told by your health care provider. This is important. Contact a health care provider if:  You have abdominal pain that:  Gets worse.  Stays in one area (localizes).  You have a rash.  You have a stiff neck.  You are more irritable than usual.  You are sleepier or more difficult to wake up than usual.  You feel weak or dizzy.  You feel very thirsty.  You have urinated only a small amount of very dark urine over 6-8 hours. Get help right away if:  You have symptoms of severe dehydration.  You cannot drink fluids without vomiting.  Your symptoms get worse with treatment.  You have a fever.  You have a severe headache.  You have vomiting or diarrhea that:  Gets worse.  Does not go away.  You have blood or green matter  (bile) in your vomit.  You have blood in your stool. This may cause stool to look black and tarry.  You have not urinated in 6-8 hours.  You faint.  Your heart rate while sitting still is over 100 beats a minute.  You have trouble breathing. This information is not intended to replace advice given to you by your health care provider. Make sure you discuss any questions you have with your health care provider. Document Released: 01/07/2005 Document Revised: 08/04/2015 Document Reviewed: 03/03/2015 [employer]vier Interactive Patient Education  2017 [employer]  

## 2016-04-12 ENCOUNTER — Telehealth: Payer: Self-pay | Admitting: Medical Oncology

## 2016-04-12 ENCOUNTER — Telehealth: Payer: Self-pay | Admitting: *Deleted

## 2016-04-12 DIAGNOSIS — C3491 Malignant neoplasm of unspecified part of right bronchus or lung: Secondary | ICD-10-CM

## 2016-04-12 DIAGNOSIS — R066 Hiccough: Secondary | ICD-10-CM

## 2016-04-12 MED ORDER — METOCLOPRAMIDE HCL 10 MG PO TABS: 10.0000 mg | ORAL_TABLET | Freq: Four times a day (QID) | ORAL | 0 refills | Status: DC | PRN

## 2016-04-12 NOTE — Telephone Encounter (Addendum)
Intractable hiccoughs - thorazine ineffective , baclofen ordered yesterday and pt took 2 doses yesterday and one  Today at 0300.  HOem remedies offered to pt -swallow 1 tsp lemon juice.Dr Alen Blew ordered reglan .  I called wife back with new medication and she said his hiccoughs stopped after taking lemon juice.

## 2016-04-12 NOTE — Telephone Encounter (Signed)
Oncology Nurse Navigator Documentation  Oncology Nurse Navigator Flowsheets 04/12/2016  Navigator Location [facility]  Navigator Encounter Type Telephone  Telephone Outgoing Call/I called Brett [name] to update him on gas card application.  I spoke with his wife and stated I needed proof of income before I can fax application.  She stated she would bring me a copy next week.    Treatment Phase Treatment  Barriers/Navigation Needs Financial  Interventions Other  Acuity Level 2  Acuity Level 3 Other  Time Spent with Patient 30

## 2016-04-18 ENCOUNTER — Encounter: Payer: Self-pay | Admitting: *Deleted

## 2016-04-18 ENCOUNTER — Other Ambulatory Visit (HOSPITAL_BASED_OUTPATIENT_CLINIC_OR_DEPARTMENT_OTHER): Payer: Medicare Other

## 2016-04-18 DIAGNOSIS — C3411 Malignant neoplasm of upper lobe, right bronchus or lung: Secondary | ICD-10-CM | POA: Diagnosis not present

## 2016-04-18 DIAGNOSIS — C3491 Malignant neoplasm of unspecified part of right bronchus or lung: Secondary | ICD-10-CM

## 2016-04-18 LAB — COMPREHENSIVE METABOLIC PANEL
ALT: 28 U/L (ref 0–55)
AST: 30 U/L (ref 5–34)
Albumin: 3.6 g/dL (ref 3.5–5.0)
Alkaline Phosphatase: 83 U/L (ref 40–150)
Anion Gap: 8 mEq/L (ref 3–11)
BUN: 6.7 mg/dL — ABNORMAL LOW (ref 7.0–26.0)
CO2: 27 mEq/L (ref 22–29)
Calcium: 9.4 mg/dL (ref 8.4–10.4)
Chloride: 104 mEq/L (ref 98–109)
Creatinine: 0.7 mg/dL (ref 0.7–1.3)
EGFR: >90 mL/min/{1.73_m2} (ref >90)
Glucose: 165 mg/dl — ABNORMAL HIGH (ref 70–140)
Potassium: 3.4 mEq/L — ABNORMAL LOW (ref 3.5–5.1)
Sodium: 139 mEq/L (ref 136–145)
Total Bilirubin: 0.25 mg/dL (ref 0.20–1.20)
Total Protein: 6.7 g/dL (ref 6.4–8.3)

## 2016-04-18 LAB — CBC WITH DIFFERENTIAL/PLATELET
BASO%: 0.5 % (ref 0.0–2.0)
Basophils Absolute: 0 10*3/uL (ref 0.0–0.1)
EOS%: 2.5 % (ref 0.0–7.0)
Eosinophils Absolute: 0.1 10*3/uL (ref 0.0–0.5)
HCT: 35.8 % — ABNORMAL LOW (ref 38.4–49.9)
HGB: 11.3 g/dL — ABNORMAL LOW (ref 13.0–17.1)
LYMPH%: 24.7 % (ref 14.0–49.0)
MCH: 23.8 pg — ABNORMAL LOW (ref 27.2–33.4)
MCHC: 31.4 g/dL — ABNORMAL LOW (ref 32.0–36.0)
MCV: 75.9 fL — ABNORMAL LOW (ref 79.3–98.0)
MONO#: 0.6 10*3/uL (ref 0.1–0.9)
MONO%: 13.5 % (ref 0.0–14.0)
NEUT#: 2.5 10*3/uL (ref 1.5–6.5)
NEUT%: 58.8 % (ref 39.0–75.0)
Platelets: 149 10*3/uL (ref 140–400)
RBC: 4.72 10*6/uL (ref 4.20–5.82)
RDW: 20.7 % — ABNORMAL HIGH (ref 11.0–14.6)
WBC: 4.3 10*3/uL (ref 4.0–10.3)
lymph#: 1.1 10*3/uL (ref 0.9–3.3)

## 2016-04-18 LAB — UA PROTEIN, DIPSTICK - CHCC: Protein, ur: NEGATIVE mg/dL

## 2016-04-18 NOTE — Progress Notes (Signed)
Oncology Nurse Navigator Documentation  Oncology Nurse Navigator Flowsheets 04/18/2016  Navigator Location [facility]  Navigator Encounter Type Clinic/[facility]/Ms. Housel brought proof of income in today.  I faxed [employer] application for a gas card grant.  I received a confirmation fax back.   Patient Visit Type MedOnc  Treatment Phase Treatment  Barriers/Navigation Needs Financial  Interventions Other  Acuity Level 2  Acuity Level 3 Other  Time Spent with Patient 30

## 2016-04-25 ENCOUNTER — Encounter: Payer: Self-pay | Admitting: Internal Medicine

## 2016-04-25 ENCOUNTER — Ambulatory Visit: Payer: Medicare Other | Admitting: Nutrition

## 2016-04-25 ENCOUNTER — Ambulatory Visit (HOSPITAL_BASED_OUTPATIENT_CLINIC_OR_DEPARTMENT_OTHER): Payer: Medicare Other

## 2016-04-25 ENCOUNTER — Other Ambulatory Visit (HOSPITAL_BASED_OUTPATIENT_CLINIC_OR_DEPARTMENT_OTHER): Payer: Medicare Other

## 2016-04-25 ENCOUNTER — Ambulatory Visit (HOSPITAL_BASED_OUTPATIENT_CLINIC_OR_DEPARTMENT_OTHER): Payer: Medicare Other | Admitting: Internal Medicine

## 2016-04-25 VITALS — BP 115/86 | HR 70 | Temp 97.9°F | Resp 18 | Ht 67.0 in | Wt 129.4 lb

## 2016-04-25 VITALS — BP 119/74 | HR 88 | Temp 97.8°F | Resp 18

## 2016-04-25 DIAGNOSIS — E1159 Type 2 diabetes mellitus with other circulatory complications: Secondary | ICD-10-CM

## 2016-04-25 DIAGNOSIS — C3491 Malignant neoplasm of unspecified part of right bronchus or lung: Secondary | ICD-10-CM

## 2016-04-25 DIAGNOSIS — C784 Secondary malignant neoplasm of small intestine: Secondary | ICD-10-CM

## 2016-04-25 DIAGNOSIS — R066 Hiccough: Secondary | ICD-10-CM

## 2016-04-25 DIAGNOSIS — C3411 Malignant neoplasm of upper lobe, right bronchus or lung: Secondary | ICD-10-CM

## 2016-04-25 DIAGNOSIS — Z5111 Encounter for antineoplastic chemotherapy: Secondary | ICD-10-CM | POA: Diagnosis not present

## 2016-04-25 DIAGNOSIS — I10 Essential (primary) hypertension: Secondary | ICD-10-CM

## 2016-04-25 DIAGNOSIS — E119 Type 2 diabetes mellitus without complications: Secondary | ICD-10-CM | POA: Diagnosis not present

## 2016-04-25 DIAGNOSIS — I739 Peripheral vascular disease, unspecified: Secondary | ICD-10-CM | POA: Diagnosis not present

## 2016-04-25 DIAGNOSIS — E43 Unspecified severe protein-calorie malnutrition: Secondary | ICD-10-CM

## 2016-04-25 LAB — CBC WITH DIFFERENTIAL/PLATELET
BASO%: 0.2 % (ref 0.0–2.0)
Basophils Absolute: 0 10*3/uL (ref 0.0–0.1)
EOS%: 0 % (ref 0.0–7.0)
Eosinophils Absolute: 0 10*3/uL (ref 0.0–0.5)
HCT: 37.5 % — ABNORMAL LOW (ref 38.4–49.9)
HGB: 11.5 g/dL — ABNORMAL LOW (ref 13.0–17.1)
LYMPH%: 9 % — ABNORMAL LOW (ref 14.0–49.0)
MCH: 23.8 pg — ABNORMAL LOW (ref 27.2–33.4)
MCHC: 30.7 g/dL — ABNORMAL LOW (ref 32.0–36.0)
MCV: 77.6 fL — ABNORMAL LOW (ref 79.3–98.0)
MONO#: 0.6 10*3/uL (ref 0.1–0.9)
MONO%: 11.3 % (ref 0.0–14.0)
NEUT#: 4.2 10*3/uL (ref 1.5–6.5)
NEUT%: 79.5 % — ABNORMAL HIGH (ref 39.0–75.0)
Platelets: 432 10*3/uL — ABNORMAL HIGH (ref 140–400)
RBC: 4.83 10*6/uL (ref 4.20–5.82)
RDW: 20 % — ABNORMAL HIGH (ref 11.0–14.6)
WBC: 5.3 10*3/uL (ref 4.0–10.3)
lymph#: 0.5 10*3/uL — ABNORMAL LOW (ref 0.9–3.3)

## 2016-04-25 LAB — COMPREHENSIVE METABOLIC PANEL
ALT: 26 U/L (ref 0–55)
AST: 18 U/L (ref 5–34)
Albumin: 3.7 g/dL (ref 3.5–5.0)
Alkaline Phosphatase: 93 U/L (ref 40–150)
Anion Gap: 10 mEq/L (ref 3–11)
BUN: 8.2 mg/dL (ref 7.0–26.0)
CO2: 23 mEq/L (ref 22–29)
Calcium: 9.6 mg/dL (ref 8.4–10.4)
Chloride: 104 mEq/L (ref 98–109)
Creatinine: 0.8 mg/dL (ref 0.7–1.3)
EGFR: >90 mL/min/{1.73_m2} (ref >90)
Glucose: 346 mg/dl — ABNORMAL HIGH (ref 70–140)
Potassium: 4.2 mEq/L (ref 3.5–5.1)
Sodium: 137 mEq/L (ref 136–145)
Total Bilirubin: <0.22 mg/dL (ref 0.20–1.20)
Total Protein: 7.1 g/dL (ref 6.4–8.3)

## 2016-04-25 LAB — UA PROTEIN, DIPSTICK - CHCC: Protein, ur: NEGATIVE mg/dL

## 2016-04-25 MED ORDER — SODIUM CHLORIDE 0.9 % IV SOLN
Freq: Once | INTRAVENOUS | Status: AC
  Administered 2016-04-25: 11:00:00 via INTRAVENOUS

## 2016-04-25 MED ORDER — PALONOSETRON HCL INJECTION 0.25 MG/5ML
0.2500 mg | Freq: Once | INTRAVENOUS | Status: AC
  Administered 2016-04-25: 0.25 mg via INTRAVENOUS

## 2016-04-25 MED ORDER — HEPARIN SOD (PORK) LOCK FLUSH 100 UNIT/ML IV SOLN
500.0000 [IU] | Freq: Once | INTRAVENOUS | Status: AC | PRN
  Administered 2016-04-25: 500 [IU]
  Filled 2016-04-25: qty 5

## 2016-04-25 MED ORDER — SODIUM CHLORIDE 0.9% FLUSH
10.0000 mL | INTRAVENOUS | Status: DC | PRN
  Administered 2016-04-25: 10 mL
  Filled 2016-04-25: qty 10

## 2016-04-25 MED ORDER — SODIUM CHLORIDE 0.9 % IV SOLN
500.0000 mg/m2 | Freq: Once | INTRAVENOUS | Status: AC
  Administered 2016-04-25: 850 mg via INTRAVENOUS
  Filled 2016-04-25: qty 20

## 2016-04-25 MED ORDER — SODIUM CHLORIDE 0.9 % IV SOLN
Freq: Once | INTRAVENOUS | Status: AC
  Administered 2016-04-25: 12:00:00 via INTRAVENOUS
  Filled 2016-04-25: qty 5

## 2016-04-25 MED ORDER — PALONOSETRON HCL INJECTION 0.25 MG/5ML
INTRAVENOUS | Status: AC
  Filled 2016-04-25: qty 5

## 2016-04-25 MED ORDER — SODIUM CHLORIDE 0.9 % IV SOLN
554.0000 mg | Freq: Once | INTRAVENOUS | Status: AC
  Administered 2016-04-25: 550 mg via INTRAVENOUS
  Filled 2016-04-25: qty 55

## 2016-04-25 NOTE — Progress Notes (Signed)
[place] [phone]   [phone]  OFFICE PROGRESS NOTE  [name], MD [address] [place] [address]  DIAGNOSIS: Stage IV (T1b, N0, M1b) non-small cell lung cancer, adenocarcinoma presented with right upper lobe lung nodule and recent metastasis to the small intestine. This was initially diagnosed in September 2017.  Genomic Alterations Identified? ERBB2 amplification - equivocal? CDKN2A p16INK4a E88* and p14ARF O242P SMARCA4 splice site [id number] SPTA1 E2022* TOP2A amplification TP53 A159P Additional Findings? Microsatellite status MS-Stable Tumor Mutation Burden TMB-Intermediate; 18 Muts/Mb Additional Disease-relevant Genes with No Reportable Alterations Identified? EGFR KRAS ALK BRAF MET RET ROS1   PRIOR THERAPY:  1) Status post right VATS with right upper lobectomy and mediastinal lymph node dissection under the care of Dr. Roxan Hockey on 10/18/2015 and the final pathology was consistent with stage IA (T1b, N0, MX). 2) upper endoscopy on 01/05/2016 showed normal esophagus, normal stomach but there was occasional mass around 3.0 CM in length circumferential nonobstructing in the jejunum. The final pathology was consistent with metastatic adenocarcinoma. 3) status post laparoscopic laparotomy and resection of proximal lesion and and distal jejunum/proximal ileum under the care of Dr. Hassell Done 1 02/27/2016.  CURRENT THERAPY: Systemic chemotherapy with carboplatin for AUC of 5 and Alimta 500 MG/M2 every 3 weeks. First dose 04/04/2016. Status post one cycle.  INTERVAL HISTORY: Brett [name] 56 y.o. male returns to the clinic today for follow-up visit accompanied by his wife. The patient tolerated the first cycle of his systemic chemotherapy with carboplatin and Alimta fairly well except for hiccups that lasted for close to 2 weeks. He was tried on several medications including Thorazine as well as baclofen with no  improvement. He was started on treatment with Reglan which did help his hiccups. He also uses lemonade juice with some improvement in his condition. He denied having any chest pain, shortness of breath, cough or hemoptysis. He has no nausea, vomiting, diarrhea or constipation. He has no significant weight loss or night sweats. He is here today for evaluation and start of cycle #2 of his chemotherapy.   MEDICAL HISTORY: Past Medical History:  Diagnosis Date  . Anemia 11/28/2015  . Anxiety   . Arthritis    "hands, back" (11/28/2015)  . Barrett's esophagus 07/01/2013   Without dysplasia on biopsy 09/03/2012. Repeat EGD recommended 08/2015  . Carotid artery stenosis 07/01/2013   Requiring right sided stent   . Chronic pain syndrome 07/01/2013  . Closed head injury with brief loss of consciousness ([place]) 07/22/2010   Head trapped in a hydraulic device at work.  Fracture of orbital bones on right and brief loss of consciousness per report.  . 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 There was likely a strong psycho-social overlay in regard to the cognitive deficits in the form of mood disorder with psychotic features and mixed anxiety symptomatology. His primary tested cognitive deficits are in the areas of attention, executi  . Daily headache "since 07/2010"   constantly  . Degenerative joint disease of cervical spine 07/01/2013  . Dehydration 04/11/2016  . Dupuytren's contracture of both hands 04/08/2014  . Encounter for antineoplastic chemotherapy 10/05/2015  . Erectile dysfunction associated with type 2 diabetes mellitus ([place]) 07/01/2013  . Fibromyalgia 07/01/2013  . Goals of care, counseling/discussion 03/28/2016  . History of blood transfusion 11/28/2015   "suppose to get his first  today" (11/28/2015)  . Hyperlipidemia LDL goal < 100 07/01/2013  . Intractable  hiccups 04/11/2016  . 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 Memory changes    "memory issues" from head injury  . Osteoarthritis of right thumb 10/21/2014  . Peripheral vascular occlusive disease ([place]) 07/01/2013   Requiring 2 arterial stents above the left knee per report  . Pneumonia ~ 2006/2007  . Post traumatic stress disorder 07/01/2013  . Primary lung adenocarcinoma ([place]) dx'd 08/2015   "right lung"  . Severe major depression with psychotic features ([place]) 04/15/2011  . Tobacco abuse 07/01/2013  . Tobacco abuse   . Type 2 diabetes mellitus with vascular disease ([place]) 07/01/2013   Left lower extremity and right carotid stenting    ALLERGIES:  is allergic to gabapentin; lyrica [pregabalin]; and celebrex [celecoxib].  MEDICATIONS:  Current Outpatient Prescriptions  Medication Sig Dispense Refill  . aspirin EC 81 MG tablet Take 81 mg by mouth daily.     Marland Kitchen atorvastatin (LIPITOR) 40 MG tablet Take 1 tablet (40 mg total) by mouth daily. (Patient taking differently: Take 40 mg by mouth every evening. ) 90 tablet 3  . clopidogrel (PLAVIX) 75 MG tablet Take 75 mg by mouth at bedtime.     Marland Kitchen dexamethasone (DECADRON) 4 MG tablet Take 1 tablet (4 mg total) by mouth 2 (two) times daily with a meal. 40 tablet 1  . ferrous sulfate 325 (65 FE) MG tablet Take 1 tablet (325 mg total) by mouth 2 (two) times daily with a meal. 60 tablet 3  . folic acid (FOLVITE) 1 MG tablet Take 1 tablet (1 mg total) by mouth daily. 30 tablet 4  . glipiZIDE (GLUCOTROL) 10 MG tablet Take 2 tablets (20 mg total) by mouth 2 (two) times daily before a meal. 360 tablet 3  . HYDROcodone-acetaminophen (NORCO) 10-325 MG tablet Take 1 tablet by mouth every 8 (eight) hours as needed for severe pain. 90 tablet 0  . lidocaine-prilocaine (EMLA) cream Apply 1 application topically as needed. 30 g 0  . lisinopril (PRINIVIL,ZESTRIL) 5 MG tablet Take 0.5 tablets (2.5 mg total) by mouth daily. 90 tablet 3  . metFORMIN (GLUCOPHAGE) 500 MG  tablet Take 1 tablet (500 mg total) by mouth 2 (two) times daily with a meal. 180 tablet 3  . metoCLOPramide (REGLAN) 10 MG tablet Take 1 tablet (10 mg total) by mouth 4 (four) times daily as needed (hiccoughs). 30 tablet 0  . pantoprazole (PROTONIX) 40 MG tablet TAKE 1 TABLET(40 MG) BY MOUTH DAILY 90 tablet 0  . prochlorperazine (COMPAZINE) 10 MG tablet Take 1 tablet (10 mg total) by mouth every 6 (six) hours as needed for nausea or vomiting. 30 tablet 0  . sitaGLIPtin (JANUVIA) 100 MG tablet Take 1 tablet (100 mg total) by mouth daily. (Patient taking differently: Take 100 mg by mouth at bedtime. ) 90 tablet 3  . baclofen (LIORESAL) 10 MG tablet TAKE 1 TABLET(10 MG) BY MOUTH THREE TIMES DAILY (Patient not taking: Reported on 04/25/2016) 270 tablet 0  . chlorproMAZINE (THORAZINE) 25 MG tablet TAKE 1 TABLET BY MOUTH THREE TIMES DAILY AS NEEDED (Patient not taking: Reported on 04/25/2016) 270 tablet 0   No current facility-administered medications for this visit.    Facility-Administered Medications Ordered in Other Visits  Medication Dose Route Frequency Provider Last Rate Last Dose  . sodium chloride flush (NS) 0.9 % injection 10 mL  10 mL Intracatheter PRN Nicholas Lose, MD   10 mL at 04/05/16 1641  SURGICAL HISTORY:  Past Surgical History:  Procedure Laterality Date  . CAROTID STENT Right ?2014  . COLONOSCOPY N/A 11/30/2015   Procedure: COLONOSCOPY;  Surgeon: Teena Irani, MD;  Location[facility] ENDOSCOPY;  Service: Endoscopy;  Laterality: N/A;  . ESOPHAGOGASTRODUODENOSCOPY N/A 11/30/2015   Procedure: ESOPHAGOGASTRODUODENOSCOPY (EGD);  Surgeon: Teena Irani, MD;  Location[facility] ENDOSCOPY;  Service: Endoscopy;  Laterality: N/A;  . ESOPHAGOGASTRODUODENOSCOPY (EGD) WITH PROPOFOL N/A 01/05/2016   Procedure: ESOPHAGOGASTRODUODENOSCOPY (EGD) WITH PROPOFOL;  Surgeon: Teena Irani, MD;  Location: [facility] ENDOSCOPY;  Service: Endoscopy;  Laterality: N/A;  . FEMORAL ARTERY STENT Left 05/2012; ~ 2015   Archie Endo 06/04/2012;  Raechel Chute report  . FRACTURE SURGERY    . GIVENS CAPSULE STUDY N/A 12/22/2015   Procedure: GIVENS CAPSULE STUDY;  Surgeon: Wonda Horner, MD;  Location: [facility] ENDOSCOPY;  Service: Endoscopy;  Laterality: N/A;  . HARDWARE REMOVAL Right 11/15/2011   Removal of deep frontozygomatic orbital hardware/notes 11/15/2011  . HERNIA REPAIR  1655   Umbilical  . LAPAROSCOPY N/A 02/27/2016   Procedure: LAPAROSCOPY, LAPAROTOMY  WITH TWO SMALL BOWEL RESECTION;  Surgeon: Johnathan Hausen, MD;  Location: [facility] ORS;  Service: General;  Laterality: N/A;  . ORIF ORBITAL FRACTURE Right 08/15/2010    caught in a hydraulic machine; open reduction internal fixation of orbital rim fracture and open reduction of zygomatic arch fracture  Archie Endo 10/13/2009  . PORTACATH PLACEMENT Left 04/04/2016   Procedure: INSERTION PORT-A-CATH LEFT CHEST;  Surgeon: Melrose Nakayama, MD;  Location: [place];  Service: Thoracic;  Laterality: Left;  Marland Kitchen VIDEO ASSISTED THORACOSCOPY (VATS)/ LOBECTOMY Right 10/18/2015   Procedure: VIDEO ASSISTED THORACOSCOPY (VATS)/ LOBECTOMY;  Surgeon: Melrose Nakayama, MD;  Location: [place];  Service: Thoracic;  Laterality: Right;  Marland Kitchen VIDEO BRONCHOSCOPY Bilateral 09/21/2015   Procedure: VIDEO BRONCHOSCOPY WITH FLUORO;  Surgeon: Juanito Doom, MD;  Location: [facility] ENDOSCOPY;  Service: Cardiopulmonary;  Laterality: Bilateral;    REVIEW OF SYSTEMS:  Constitutional: positive for fatigue Eyes: negative Ears, nose, mouth, throat, and face: negative Respiratory: negative Cardiovascular: negative Gastrointestinal: positive for Hiccups Genitourinary:negative Integument/breast: negative Hematologic/lymphatic: negative Musculoskeletal:negative Neurological: negative Behavioral/Psych: negative Endocrine: negative Allergic/Immunologic: negative   PHYSICAL EXAMINATION: General appearance: alert, cooperative, fatigued and no distress Head: Normocephalic, without obvious abnormality, atraumatic Neck: no adenopathy, no JVD,  supple, symmetrical, trachea midline and thyroid not enlarged, symmetric, no tenderness/mass/nodules Lymph nodes: Cervical, supraclavicular, and axillary nodes normal. Resp: clear to auscultation bilaterally Back: symmetric, no curvature. ROM normal. No CVA tenderness. Cardio: regular rate and rhythm, S1, S2 normal, no murmur, click, rub or gallop GI: soft, non-tender; bowel sounds normal; no masses,  no organomegaly Extremities: extremities normal, atraumatic, no cyanosis or edema Neurologic: Alert and oriented X 3, normal strength and tone. Normal symmetric reflexes. Normal coordination and gait  ECOG PERFORMANCE STATUS: 1 - Symptomatic but completely ambulatory  Blood pressure 115/86, pulse 70, temperature 97.9 F (36.6 C), temperature source Oral, resp. rate 18, height 5' 7"  (1.702 m), weight 129 lb 6.4 oz (58.7 kg), SpO2 100 %.  LABORATORY DATA: Lab Results  Component Value Date   WBC 5.3 04/25/2016   HGB 11.5 (L) 04/25/2016   HCT 37.5 (L) 04/25/2016   MCV 77.6 (L) 04/25/2016   PLT 432 (H) 04/25/2016      Chemistry      Component Value Date/Time   NA 137 04/25/2016 0928   K 4.2 04/25/2016 0928   CL 103 04/04/2016 1206   CO2 23 04/25/2016 0928   BUN 8.2 04/25/2016 3748  CREATININE 0.8 04/25/2016 0928      Component Value Date/Time   CALCIUM 9.6 04/25/2016 0928   ALKPHOS 93 04/25/2016 0928   AST 18 04/25/2016 0928   ALT 26 04/25/2016 0928   BILITOT <0.22 04/25/2016 9604       RADIOGRAPHIC STUDIES: Dg Chest 2 View  Result Date: 04/04/2016 CLINICAL DATA:  PRE-OP FOR PORTA-CATH INSERTION.HX Non-small cell lung cancer.COUGH. EXAM: CHEST  2 VIEW COMPARISON:  CT, 04/03/2016. FINDINGS: The cardiac silhouette is normal in size and configuration. No mediastinal or hilar masses. No evidence of adenopathy. There pulmonary anastomosis staple lines along the medial and lateral right mid lung, stable. Lungs are otherwise clear. No pleural effusion or pneumothorax. Skeletal  structures are intact. IMPRESSION: No active cardiopulmonary disease. Electronically Signed   By: Lajean Manes M.D.   On: 04/04/2016 12:09   Ct Chest W Contrast  Result Date: 04/03/2016 CLINICAL DATA:  Lung cancer. Non-small cell lung cancer. Chemotherapy ongoing. EXAM: CT CHEST, ABDOMEN, AND PELVIS WITH CONTRAST TECHNIQUE: Multidetector CT imaging of the chest, abdomen and pelvis was performed following the standard protocol during bolus administration of intravenous contrast. CONTRAST:  133m ISOVUE-300 IOPAMIDOL (ISOVUE-300) INJECTION 61% COMPARISON:  PET-CT 10/03/2015 FINDINGS: CT CHEST FINDINGS Cardiovascular: No significant vascular findings. Normal heart size. No pericardial effusion. Mediastinum/Nodes: No axillary or supraclavicular adenopathy. No mediastinal hilar adenopathy. Lungs/Pleura: Postsurgical changes in LEFT upper lobe remnant lobe. No new nodularity Musculoskeletal: No aggressive osseous lesion. CT ABDOMEN AND PELVIS FINDINGS Hepatobiliary: No focal hepatic lesion. No biliary ductal dilatation. Gallbladder is normal. Common bile duct is normal. Pancreas: Pancreas is normal. No ductal dilatation. No pancreatic inflammation. Spleen: Normal spleen Adrenals/urinary tract: Adrenal glands and kidneys are normal. The ureters and bladder normal. Stomach/Bowel: Stomach and duodenum normal. Interval partial small bowel are section. No obstruction of the small bowel. No small bowel mass identified. The colon and rectosigmoid colon are normal. Vascular/Lymphatic:  Calcification abdominal aorta. There are newly enlarged periaortic retroperitoneal lymph nodes. For example lymph node enlarged 13 mm LEFT of the SMA (image 67, series 2) with central low-attenuation consists with necrosis. Similar lymph node LEFT adjacent aorta at the level of the LEFT renal vein measures 13 mm (image 73, series 2). Approximately 6 of these lymph nodes. Additional enlarged lymph node within the central mesenteries. For  example 14 mm lymph node in the mid abdomen (image 71, series 2). No pelvic lymphadenopathy.  No inguinal adenopathy. Reproductive: Prostate normal Other: No peritoneal or omental nodularity Musculoskeletal: No aggressive osseous lesion. IMPRESSION: Chest Impression: 1. No evidence of recurrence or metastasis in thorax. 2. Postsurgical change in the RIGHT upper lobe. Abdomen / Pelvis Impression: 1. New small necrotic lymph nodes in the retroperitoneum clustered about the proximal aorta consistent with metastatic disease. 2. Small abnormal lymph node within the the central small bowel mesentery is similar. 3. No lesion of the bowel itself identified.  No bowel obstruction. Electronically Signed   By: SSuzy BouchardM.D.   On: 04/03/2016 20:37   Mr BJeri CosWVWContrast  Result Date: 04/03/2016 CLINICAL DATA:  Initial evaluation for primary adenocarcinoma of right lung. EXAM: MRI HEAD WITHOUT AND WITH CONTRAST TECHNIQUE: Multiplanar, multiecho pulse sequences of the brain and surrounding structures were obtained without and with intravenous contrast. CONTRAST:  142mMULTIHANCE GADOBENATE DIMEGLUMINE 529 MG/ML IV SOLN COMPARISON:  Prior brain MRI from 10/17/2015. FINDINGS: Brain: Cerebral volume within normal limits for age. No significant cerebral white matter disease. No focal parenchymal signal abnormality. No abnormal foci  of restricted diffusion to suggest acute or subacute ischemia. Gray-white matter differentiation maintained. No encephalomalacia to suggest chronic infarction. No foci of susceptibility artifact to suggest acute or chronic intracranial hemorrhage. No mass lesion, midline shift or mass effect. No hydrocephalus. No extra-axial fluid collection. Major dural sinuses are grossly patent. No abnormal enhancement. No evidence for intracranial metastatic disease. Pituitary gland and suprasellar region within normal limits. Vascular: Major intracranial vascular flow voids maintained. Skull and upper  cervical spine: Cerebellar tonsils mildly low lying within the foramen magnum without frank Chiari malformation. Craniocervical junction otherwise unremarkable. Visualized upper cervical spine within normal limits. Bone marrow signal intensity normal. No scalp soft tissue abnormality. Sinuses/Orbits: Globes and orbital soft tissues within normal limits. Mild scattered mucosal thickening within the ethmoidal air cells and maxillary sinuses. Paranasal sinuses are otherwise clear. Trace opacity left mastoid air cells. Mastoids otherwise clear. Inner ear structures normal. Other: No other significant finding. IMPRESSION: No evidence for intracranial metastatic disease. Electronically Signed   By: Jeannine Boga M.D.   On: 04/03/2016 20:18   Ct Abdomen Pelvis W Contrast  Result Date: 04/03/2016 CLINICAL DATA:  Lung cancer. Non-small cell lung cancer. Chemotherapy ongoing. EXAM: CT CHEST, ABDOMEN, AND PELVIS WITH CONTRAST TECHNIQUE: Multidetector CT imaging of the chest, abdomen and pelvis was performed following the standard protocol during bolus administration of intravenous contrast. CONTRAST:  121m ISOVUE-300 IOPAMIDOL (ISOVUE-300) INJECTION 61% COMPARISON:  PET-CT 10/03/2015 FINDINGS: CT CHEST FINDINGS Cardiovascular: No significant vascular findings. Normal heart size. No pericardial effusion. Mediastinum/Nodes: No axillary or supraclavicular adenopathy. No mediastinal hilar adenopathy. Lungs/Pleura: Postsurgical changes in LEFT upper lobe remnant lobe. No new nodularity Musculoskeletal: No aggressive osseous lesion. CT ABDOMEN AND PELVIS FINDINGS Hepatobiliary: No focal hepatic lesion. No biliary ductal dilatation. Gallbladder is normal. Common bile duct is normal. Pancreas: Pancreas is normal. No ductal dilatation. No pancreatic inflammation. Spleen: Normal spleen Adrenals/urinary tract: Adrenal glands and kidneys are normal. The ureters and bladder normal. Stomach/Bowel: Stomach and duodenum normal.  Interval partial small bowel are section. No obstruction of the small bowel. No small bowel mass identified. The colon and rectosigmoid colon are normal. Vascular/Lymphatic:  Calcification abdominal aorta. There are newly enlarged periaortic retroperitoneal lymph nodes. For example lymph node enlarged 13 mm LEFT of the SMA (image 67, series 2) with central low-attenuation consists with necrosis. Similar lymph node LEFT adjacent aorta at the level of the LEFT renal vein measures 13 mm (image 73, series 2). Approximately 6 of these lymph nodes. Additional enlarged lymph node within the central mesenteries. For example 14 mm lymph node in the mid abdomen (image 71, series 2). No pelvic lymphadenopathy.  No inguinal adenopathy. Reproductive: Prostate normal Other: No peritoneal or omental nodularity Musculoskeletal: No aggressive osseous lesion. IMPRESSION: Chest Impression: 1. No evidence of recurrence or metastasis in thorax. 2. Postsurgical change in the RIGHT upper lobe. Abdomen / Pelvis Impression: 1. New small necrotic lymph nodes in the retroperitoneum clustered about the proximal aorta consistent with metastatic disease. 2. Small abnormal lymph node within the the central small bowel mesentery is similar. 3. No lesion of the bowel itself identified.  No bowel obstruction. Electronically Signed   By: SSuzy BouchardM.D.   On: 04/03/2016 20:37   Dg Fluoro Guide Cv Line-no Report  Result Date: 04/04/2016 Fluoroscopy was utilized by the requesting physician.  No radiographic interpretation.     ASSESSMENT AND PLAN:  This is a very pleasant 56years old white male with metastatic non-small cell lung cancer, adenocarcinoma status post  right upper lobectomy with mediastinal lymph node dissection the patient was also found to have metastatic disease in these regimen status post surgical resection of the proximal and distal regimen and the proximal ileum. He is currently undergoing systemic chemotherapy with  carboplatin and Alimta status post 1 cycle and tolerated the treatment well. I have a lengthy discussion with the patient his wife today about his current treatment options and whether to start the patient on Avastin or not. He has a history of stent placement for peripheral vascular disease and he is currently on Plavix and aspirin. I showed the risk of treatment with the Avastin is higher than the benefit of the treatment. I recommended for the patient to continue systemic chemotherapy with carboplatin and Alimta without Avastin at this point. I will see him back for follow-up visit in 3 weeks for evaluation before starting cycle #3. For the iron deficiency anemia he will continue on the oral iron tablet. For the history of diabetes mellitus, I recommended for the patient to monitor his blood sugar closely and to take his medication as prescribed. For the hiccups, he will continue his current treatment with Reglan and Lemonade juice. The patient was advised to call immediately if he has any concerning symptoms in the interval. The patient voices understanding of current disease status and treatment options and is in agreement with the current care plan.  All questions were answered. The patient knows to call the clinic with any problems, questions or concerns. We can certainly see the patient much sooner if necessary.  Disclaimer: This note was dictated with voice recognition software. Similar sounding words can inadvertently be transcribed and may not be corrected upon review.

## 2016-04-25 NOTE — Progress Notes (Signed)
Patient was identified to be at risk for malnutrition on the MST secondary to poor appetite and weight loss.  56 year old male diagnosed with lung cancer.  He is a patient of Dr. Julien Nordmann.  Past medical history includes diabetes, tobacco, depression, PTSD, PVD, hyperlipidemia, Barrett's esophagus, anxiety, and anemia.  Medications include Lipitor, Decadron, ferrous sulfate, Glucotrol, Glucophage, Reglan, Protonix, Compazine, and Januvia.  Labs include glucose 346 April 5, likely secondary to dexamethasone.  Height: 67 inches. Weight: 129 pounds.  Improved from 125 pounds. Usual body weight: 130-135 pounds. BMI: 20.3.  Patient reports he had decreased oral intake after chemotherapy secondary to hiccups. Reports Reglan has improved hiccups. M.D. has discontinued steroids at home. Dietary recall reveals patient consumes concentrated sweets and does not follow special diet. Patient reports increased appetite and oral intake.  Nutrition diagnosis:  Food and nutrition related knowledge deficit related to lung cancer and associated treatments as evidenced by no prior need for nutrition related information.  Intervention: Educated patient to consume small frequent meals and snacks utilizing high-calorie, high-protein foods. Recommended patient avoid concentrated sweets to improve blood sugars while on treatment. Provided fact sheets.  Answered questions.  Provided contact information.  Used Teach back method.  Monitoring, evaluation, goals: Patient will tolerate high-calorie, high-protein diet to minimize weight loss and improved glycemic control.  Next visit: To be scheduled as needed.  **Disclaimer: This note was dictated with voice recognition software. Similar sounding words can inadvertently be transcribed and this note may contain transcription errors which may not have been corrected upon publication of note.**

## 2016-04-25 NOTE — Progress Notes (Signed)
1302 patient began complaining of headache radiating from the right side of his head to the left side as well as his left eye becoming "watery". Carboplatin stopped. Patient states he has a history of a constant headache to the right side of his head, but it does not usually radiate to the left side. VSS. 1306 Patient stated headache subsided. Dr. Julien Nordmann notified and instructed this RN to continue treatment. Carboplatin restarted and patient tolerated remainder of treatment well.

## 2016-04-25 NOTE — Patient Instructions (Signed)
[facility] Discharge Instructions for Patients Receiving Chemotherapy  Today you received the following chemotherapy agents: Alimta and Carboplatin.  To help prevent nausea and vomiting after your treatment, we encourage you to take your nausea medication as directed.   If you develop nausea and vomiting that is not controlled by your nausea medication, call the clinic.   BELOW ARE SYMPTOMS THAT SHOULD BE REPORTED IMMEDIATELY:  *FEVER GREATER THAN 100.5 F  *CHILLS WITH OR WITHOUT FEVER  NAUSEA AND VOMITING THAT IS NOT CONTROLLED WITH YOUR NAUSEA MEDICATION  *UNUSUAL SHORTNESS OF BREATH  *UNUSUAL BRUISING OR BLEEDING  TENDERNESS IN MOUTH AND THROAT WITH OR WITHOUT PRESENCE OF ULCERS  *URINARY PROBLEMS  *BOWEL PROBLEMS  UNUSUAL RASH Items with * indicate a potential emergency and should be followed up as soon as possible.  Feel free to call the clinic you have any questions or concerns. The clinic phone number is [phone].  Please show the CHEMO ALERT CARD at check-in to the Emergency Department and triage nurse.   

## 2016-04-26 ENCOUNTER — Telehealth: Payer: Self-pay | Admitting: Internal Medicine

## 2016-04-26 ENCOUNTER — Encounter: Payer: Self-pay | Admitting: *Deleted

## 2016-04-26 NOTE — Progress Notes (Signed)
Oncology Nurse Navigator Documentation  Oncology Nurse Navigator Flowsheets 04/26/2016  Navigator Location [facility]  Navigator Encounter Type Telephone  Telephone Incoming Call/Ms Knoth called.  She wanted to see if I got her financial statements for gas card grant.  I stated I received them and faxed to the [facility].  She was thankful for the help.   I also asked how Mr. Danser is doing with the hiccups.  Ms. Arney states he is doing well.  She states lemon juice helps.   Treatment Phase Treatment  Barriers/Navigation Needs Education  Education Other  Interventions Education  Education Method Verbal  Acuity Level 1  Time Spent with Patient 30

## 2016-04-26 NOTE — Telephone Encounter (Signed)
Added appts per 04/25/2016 los - Patients wife answered the phone and will have husband pick up a new scheduled when they come in on 4/12.

## 2016-04-27 ENCOUNTER — Other Ambulatory Visit: Payer: Self-pay | Admitting: Internal Medicine

## 2016-04-27 DIAGNOSIS — D500 Iron deficiency anemia secondary to blood loss (chronic): Secondary | ICD-10-CM

## 2016-05-02 ENCOUNTER — Other Ambulatory Visit (HOSPITAL_BASED_OUTPATIENT_CLINIC_OR_DEPARTMENT_OTHER): Payer: Medicare Other

## 2016-05-02 ENCOUNTER — Telehealth: Payer: Self-pay | Admitting: Internal Medicine

## 2016-05-02 DIAGNOSIS — C3491 Malignant neoplasm of unspecified part of right bronchus or lung: Secondary | ICD-10-CM

## 2016-05-02 DIAGNOSIS — C3411 Malignant neoplasm of upper lobe, right bronchus or lung: Secondary | ICD-10-CM | POA: Diagnosis not present

## 2016-05-02 LAB — CBC WITH DIFFERENTIAL/PLATELET
BASO%: 0.9 % (ref 0.0–2.0)
Basophils Absolute: 0 10*3/uL (ref 0.0–0.1)
EOS%: 2.8 % (ref 0.0–7.0)
Eosinophils Absolute: 0.1 10*3/uL (ref 0.0–0.5)
HCT: 37.5 % — ABNORMAL LOW (ref 38.4–49.9)
HGB: 11.9 g/dL — ABNORMAL LOW (ref 13.0–17.1)
LYMPH%: 33.2 % (ref 14.0–49.0)
MCH: 24.2 pg — ABNORMAL LOW (ref 27.2–33.4)
MCHC: 31.6 g/dL — ABNORMAL LOW (ref 32.0–36.0)
MCV: 76.6 fL — ABNORMAL LOW (ref 79.3–98.0)
MONO#: 0.2 10*3/uL (ref 0.1–0.9)
MONO%: 7.2 % (ref 0.0–14.0)
NEUT#: 1.3 10*3/uL — ABNORMAL LOW (ref 1.5–6.5)
NEUT%: 55.9 % (ref 39.0–75.0)
Platelets: 224 10*3/uL (ref 140–400)
RBC: 4.9 10*6/uL (ref 4.20–5.82)
RDW: 21.8 % — ABNORMAL HIGH (ref 11.0–14.6)
WBC: 2.3 10*3/uL — ABNORMAL LOW (ref 4.0–10.3)
lymph#: 0.8 10*3/uL — ABNORMAL LOW (ref 0.9–3.3)

## 2016-05-02 LAB — COMPREHENSIVE METABOLIC PANEL
ALT: 27 U/L (ref 0–55)
AST: 28 U/L (ref 5–34)
Albumin: 3.7 g/dL (ref 3.5–5.0)
Alkaline Phosphatase: 81 U/L (ref 40–150)
Anion Gap: 10 mEq/L (ref 3–11)
BUN: 8.1 mg/dL (ref 7.0–26.0)
CO2: 27 mEq/L (ref 22–29)
Calcium: 9.4 mg/dL (ref 8.4–10.4)
Chloride: 101 mEq/L (ref 98–109)
Creatinine: 0.7 mg/dL (ref 0.7–1.3)
EGFR: >90 mL/min/{1.73_m2} (ref >90)
Glucose: 202 mg/dl — ABNORMAL HIGH (ref 70–140)
Potassium: 4.3 mEq/L (ref 3.5–5.1)
Sodium: 137 mEq/L (ref 136–145)
Total Bilirubin: 0.32 mg/dL (ref 0.20–1.20)
Total Protein: 6.9 g/dL (ref 6.4–8.3)

## 2016-05-02 LAB — UA PROTEIN, DIPSTICK - CHCC: Protein, ur: <30 mg/dL

## 2016-05-02 NOTE — Telephone Encounter (Signed)
Patient came to scheduling to pick up print out of schedule.

## 2016-05-09 ENCOUNTER — Other Ambulatory Visit (HOSPITAL_BASED_OUTPATIENT_CLINIC_OR_DEPARTMENT_OTHER): Payer: Medicare Other

## 2016-05-09 DIAGNOSIS — C3411 Malignant neoplasm of upper lobe, right bronchus or lung: Secondary | ICD-10-CM | POA: Diagnosis not present

## 2016-05-09 DIAGNOSIS — C3491 Malignant neoplasm of unspecified part of right bronchus or lung: Secondary | ICD-10-CM

## 2016-05-09 DIAGNOSIS — C784 Secondary malignant neoplasm of small intestine: Secondary | ICD-10-CM | POA: Diagnosis not present

## 2016-05-09 LAB — COMPREHENSIVE METABOLIC PANEL
ALT: 23 U/L (ref 0–55)
AST: 26 U/L (ref 5–34)
Albumin: 3.5 g/dL (ref 3.5–5.0)
Alkaline Phosphatase: 92 U/L (ref 40–150)
Anion Gap: 10 mEq/L (ref 3–11)
BUN: 5.2 mg/dL — ABNORMAL LOW (ref 7.0–26.0)
CO2: 24 mEq/L (ref 22–29)
Calcium: 9.8 mg/dL (ref 8.4–10.4)
Chloride: 102 mEq/L (ref 98–109)
Creatinine: 0.7 mg/dL (ref 0.7–1.3)
EGFR: >90 mL/min/{1.73_m2} (ref >90)
Glucose: 214 mg/dl — ABNORMAL HIGH (ref 70–140)
Potassium: 4.3 mEq/L (ref 3.5–5.1)
Sodium: 137 mEq/L (ref 136–145)
Total Bilirubin: 0.32 mg/dL (ref 0.20–1.20)
Total Protein: 6.8 g/dL (ref 6.4–8.3)

## 2016-05-09 LAB — CBC WITH DIFFERENTIAL/PLATELET
BASO%: 0.6 % (ref 0.0–2.0)
Basophils Absolute: 0 10*3/uL (ref 0.0–0.1)
EOS%: 3.1 % (ref 0.0–7.0)
Eosinophils Absolute: 0.1 10*3/uL (ref 0.0–0.5)
HCT: 36.7 % — ABNORMAL LOW (ref 38.4–49.9)
HGB: 11.8 g/dL — ABNORMAL LOW (ref 13.0–17.1)
LYMPH%: 19.7 % (ref 14.0–49.0)
MCH: 24.8 pg — ABNORMAL LOW (ref 27.2–33.4)
MCHC: 32 g/dL (ref 32.0–36.0)
MCV: 77.5 fL — ABNORMAL LOW (ref 79.3–98.0)
MONO#: 0.6 10*3/uL (ref 0.1–0.9)
MONO%: 18.8 % — ABNORMAL HIGH (ref 0.0–14.0)
NEUT#: 2 10*3/uL (ref 1.5–6.5)
NEUT%: 57.8 % (ref 39.0–75.0)
Platelets: 134 10*3/uL — ABNORMAL LOW (ref 140–400)
RBC: 4.74 10*6/uL (ref 4.20–5.82)
RDW: 22 % — ABNORMAL HIGH (ref 11.0–14.6)
WBC: 3.4 10*3/uL — ABNORMAL LOW (ref 4.0–10.3)
lymph#: 0.7 10*3/uL — ABNORMAL LOW (ref 0.9–3.3)

## 2016-05-16 ENCOUNTER — Ambulatory Visit (HOSPITAL_BASED_OUTPATIENT_CLINIC_OR_DEPARTMENT_OTHER): Payer: Medicare Other | Admitting: Nurse Practitioner

## 2016-05-16 ENCOUNTER — Ambulatory Visit (HOSPITAL_BASED_OUTPATIENT_CLINIC_OR_DEPARTMENT_OTHER): Payer: Medicare Other

## 2016-05-16 ENCOUNTER — Other Ambulatory Visit (HOSPITAL_BASED_OUTPATIENT_CLINIC_OR_DEPARTMENT_OTHER): Payer: Medicare Other

## 2016-05-16 ENCOUNTER — Telehealth: Payer: Self-pay | Admitting: Internal Medicine

## 2016-05-16 ENCOUNTER — Ambulatory Visit: Payer: Medicare Other

## 2016-05-16 ENCOUNTER — Ambulatory Visit (HOSPITAL_COMMUNITY)
Admission: RE | Admit: 2016-05-16 | Discharge: 2016-05-16 | Disposition: A | Discharge status: Home / Self Care | Payer: Medicare Other | Source: Ambulatory Visit | Attending: Nurse Practitioner | Admitting: Nurse Practitioner

## 2016-05-16 VITALS — BP 114/83 | HR 91 | Temp 97.8°F | Resp 17 | Ht 67.0 in | Wt 120.2 lb

## 2016-05-16 VITALS — BP 128/68 | HR 72

## 2016-05-16 DIAGNOSIS — C3490 Malignant neoplasm of unspecified part of unspecified bronchus or lung: Secondary | ICD-10-CM | POA: Insufficient documentation

## 2016-05-16 DIAGNOSIS — R109 Unspecified abdominal pain: Secondary | ICD-10-CM

## 2016-05-16 DIAGNOSIS — K5900 Constipation, unspecified: Secondary | ICD-10-CM

## 2016-05-16 DIAGNOSIS — C784 Secondary malignant neoplasm of small intestine: Secondary | ICD-10-CM | POA: Diagnosis not present

## 2016-05-16 DIAGNOSIS — E860 Dehydration: Secondary | ICD-10-CM

## 2016-05-16 DIAGNOSIS — C3411 Malignant neoplasm of upper lobe, right bronchus or lung: Secondary | ICD-10-CM

## 2016-05-16 DIAGNOSIS — C3491 Malignant neoplasm of unspecified part of right bronchus or lung: Secondary | ICD-10-CM

## 2016-05-16 DIAGNOSIS — R112 Nausea with vomiting, unspecified: Secondary | ICD-10-CM

## 2016-05-16 DIAGNOSIS — C7880 Secondary malignant neoplasm of unspecified digestive organ: Secondary | ICD-10-CM | POA: Diagnosis not present

## 2016-05-16 DIAGNOSIS — M545 Low back pain: Secondary | ICD-10-CM | POA: Diagnosis not present

## 2016-05-16 LAB — CBC WITH DIFFERENTIAL/PLATELET
BASO%: 0.2 % (ref 0.0–2.0)
Basophils Absolute: 0 10*3/uL (ref 0.0–0.1)
EOS%: 1.7 % (ref 0.0–7.0)
Eosinophils Absolute: 0.1 10*3/uL (ref 0.0–0.5)
HCT: 39.3 % (ref 38.4–49.9)
HGB: 12.9 g/dL — ABNORMAL LOW (ref 13.0–17.1)
LYMPH%: 12.6 % — ABNORMAL LOW (ref 14.0–49.0)
MCH: 25.6 pg — ABNORMAL LOW (ref 27.2–33.4)
MCHC: 32.8 g/dL (ref 32.0–36.0)
MCV: 78 fL — ABNORMAL LOW (ref 79.3–98.0)
MONO#: 1 10*3/uL — ABNORMAL HIGH (ref 0.1–0.9)
MONO%: 14.8 % — ABNORMAL HIGH (ref 0.0–14.0)
NEUT#: 4.7 10*3/uL (ref 1.5–6.5)
NEUT%: 70.7 % (ref 39.0–75.0)
Platelets: 331 10*3/uL (ref 140–400)
RBC: 5.04 10*6/uL (ref 4.20–5.82)
RDW: 20.9 % — ABNORMAL HIGH (ref 11.0–14.6)
WBC: 6.6 10*3/uL (ref 4.0–10.3)
lymph#: 0.8 10*3/uL — ABNORMAL LOW (ref 0.9–3.3)

## 2016-05-16 LAB — COMPREHENSIVE METABOLIC PANEL
ALT: 23 U/L (ref 0–55)
AST: 30 U/L (ref 5–34)
Albumin: 3.7 g/dL (ref 3.5–5.0)
Alkaline Phosphatase: 96 U/L (ref 40–150)
Anion Gap: 13 mEq/L — ABNORMAL HIGH (ref 3–11)
BUN: 17.9 mg/dL (ref 7.0–26.0)
CO2: 24 mEq/L (ref 22–29)
Calcium: 9.8 mg/dL (ref 8.4–10.4)
Chloride: 96 mEq/L — ABNORMAL LOW (ref 98–109)
Creatinine: 0.8 mg/dL (ref 0.7–1.3)
EGFR: >90 mL/min/{1.73_m2} (ref >90)
Glucose: 136 mg/dl (ref 70–140)
Potassium: 4.7 mEq/L (ref 3.5–5.1)
Sodium: 133 mEq/L — ABNORMAL LOW (ref 136–145)
Total Bilirubin: 0.4 mg/dL (ref 0.20–1.20)
Total Protein: 7.4 g/dL (ref 6.4–8.3)

## 2016-05-16 LAB — UA PROTEIN, DIPSTICK - CHCC: Protein, ur: 30 mg/dL

## 2016-05-16 MED ORDER — OXYCODONE-ACETAMINOPHEN 5-325 MG PO TABS: 1.0000 | ORAL_TABLET | Freq: Four times a day (QID) | ORAL | 0 refills | Status: DC | PRN

## 2016-05-16 MED ORDER — SORBITOL 70 % PO SOLN: ORAL | 0 refills | Status: DC

## 2016-05-16 MED ORDER — SODIUM CHLORIDE 0.9% FLUSH
10.0000 mL | Freq: Once | INTRAVENOUS | Status: AC
  Administered 2016-05-16: 10 mL via INTRAVENOUS
  Filled 2016-05-16: qty 10

## 2016-05-16 MED ORDER — HEPARIN SOD (PORK) LOCK FLUSH 100 UNIT/ML IV SOLN
500.0000 [IU] | Freq: Once | INTRAVENOUS | Status: AC
  Administered 2016-05-16: 500 [IU] via INTRAVENOUS
  Filled 2016-05-16: qty 5

## 2016-05-16 MED ORDER — SODIUM CHLORIDE 0.9 % IV SOLN
INTRAVENOUS | Status: AC
  Administered 2016-05-16: 13:00:00 via INTRAVENOUS

## 2016-05-16 NOTE — Progress Notes (Addendum)
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 This was initially diagnosed in September 2017.  Genomic Alterations Identified? ERBB2 amplification - equivocal? CDKN2A p16INK4a E88* and p14ARF R416L SMARCA4 splice site [id number]>ZY SPTA1 E2022* TOP2A amplification TP53 A159P Additional Findings? Microsatellite status MS-Stable Tumor Mutation Burden TMB-Intermediate; 18 Muts/Mb Additional Disease-relevant Genes with No Reportable Alterations Identified? EGFR KRAS ALK BRAF MET RET ROS1   PRIOR THERAPY:  1) Status post right VATS with right upper lobectomy and mediastinal lymph node dissection under the care of Dr. Roxan Hockey on 10/18/2015 and the final pathology was consistent with stage IA (T1b, N0, MX). 2) upper endoscopy on 01/05/2016 showed normal esophagus, normal stomach but there was occasional mass around 3.0 CM in length circumferential nonobstructing in the jejunum. The final pathology was consistent with metastatic adenocarcinoma. 3) status post laparoscopic laparotomy and resection of proximal lesion and and distal jejunum/proximal ileum under the care of Dr. Hassell Done 1 02/27/2016.  CURRENT THERAPY: Systemic chemotherapy with carboplatin for AUC of 5 and Alimta 500 MG/M2 every 3 weeks. First dose 04/04/2016. Status post 2 cycles.  INTERVAL HISTORY:   Brett [name] returns as scheduled. He completed cycle 2 carboplatin/Alimta 04/25/2016. He had no problem with hiccups following cycle 2. About a week ago he developed abdominal pain, nausea/vomiting and constipation. Last bowel movement 5 days ago. He is not eating or drinking much. He is losing weight. He is also having intermittent back pain. No fever.  Objective:  Vital signs in last 24 hours:  Blood pressure 106/75, pulse (!) 104, temperature  97.9 F (36.6 C), temperature source Oral, resp. rate 18, height 5' 7"  (1.702 m), weight 120 lb 3.2 oz (54.5 kg), SpO2 100 %.    HEENT: Mouth is dry appearing. No thrush. Resp: Lungs clear bilaterally. Cardio: Regular rate and rhythm. GI: Abdomen is soft. Nontender. No hepatomegaly. Bowel sounds present. Vascular: No leg edema. Neuro: Alert and oriented.  Port-A-Cath without erythema.  Lab Results:  Lab Results  Component Value Date   WBC 6.6 05/16/2016   HGB 12.9 (L) 05/16/2016   HCT 39.3 05/16/2016   MCV 78.0 (L) 05/16/2016   PLT 331 05/16/2016   NEUTROABS 4.7 05/16/2016    Imaging:  No results found.  Medications: I have reviewed the patient's current medications.  Assessment/Plan: 1. Metastatic non-small cell lung cancer, adenocarcinoma status post right upper lobectomy with mediastinal lymph node dissection; metastatic to small intestine status post resection of proximal jejunum and distal jejunum/proximal ileum 02/27/2016; now status post 2 cycles carboplatin/Alimta. 2. Approximate 5 day history of nausea/vomiting, abdominal pain, constipation  Disposition: Brett [name] has completed 2 cycles of carboplatin/Alimta. He presents today for routine follow-up prior to proceeding with cycle 3. He reports an approximate 5 day history of nausea/vomiting, abdominal pain and constipation. We are holding today's chemotherapy and referring him for stat abdominal x-ray to rule out obstruction. He will return to the office immediately following the x-ray to review the results.  Addendum-the abdominal x-ray showed moderate stool volume without obstruction or rectal impaction. Dr. Benay Spice and I reviewed the results with Brett [name] and his wife. We discussed hospitalization for intravenous hydration and further evaluation with CT scans. He would like to try to manage this as an outpatient. We are giving a liter of IV fluids in the office today. He will begin sorbitol 15 mL every 4  hours until a  bowel movement. We are referring him for CT abdomen/pelvis tomorrow morning. We will see him in follow-up tomorrow to review the results. He understands to seek evaluation in the emergency Department if symptoms worsen or he develops any new worrisome symptoms.  Patient seen with Dr. Benay Spice. 35 minutes were spent face-to-face at today's visit with the majority of that time involved in counseling/coordination of care.  Ned Card ANP/GNP-BC   05/16/2016  9:48 AM This was a shared visit with Ned Card. Brett [name] was interviewed and examined. The chemotherapy will be held today. The abdominal pain is possibly related to constipation, but we are concerned he has developed an early bowel obstruction or progressive metastatic disease. He will receive intravenous fluids at the Cancer center today and tomorrow. He will be referred for a CT tomorrow.  Julieanne Manson, M.D.

## 2016-05-16 NOTE — Telephone Encounter (Signed)
Appointments scheduled per 05/16/16 los. Patient was given a copy of the AVS report and appointment schedule per 05/16/16 los.

## 2016-05-16 NOTE — Progress Notes (Signed)
RN visit for IV fluids. 

## 2016-05-16 NOTE — Progress Notes (Signed)
Recheck vitals

## 2016-05-16 NOTE — Patient Instructions (Signed)
Dehydration, Adult Dehydration is a condition in which there is not enough fluid or water in the body. This happens when you lose more fluids than you take in. Important organs, such as the kidneys, brain, and heart, cannot function without a proper amount of fluids. Any loss of fluids from the body can lead to dehydration. Dehydration can range from mild to severe. This condition should be treated right away to prevent it from becoming severe. What are the causes? This condition may be caused by:  Vomiting.  Diarrhea.  Excessive sweating, such as from heat exposure or exercise.  Not drinking enough fluid, especially:  When ill.  While doing activity that requires a lot of energy.  Excessive urination.  Fever.  Infection.  Certain medicines, such as medicines that cause the body to lose excess fluid (diuretics).  Inability to access safe drinking water.  Reduced physical ability to get adequate water and food. What increases the risk? This condition is more likely to develop in people:  Who have a poorly controlled long-term (chronic) illness, such as diabetes, heart disease, or kidney disease.  Who are age 65 or older.  Who are disabled.  Who live in a place with high altitude.  Who play endurance sports. What are the signs or symptoms? Symptoms of mild dehydration may include:   Thirst.  Dry lips.  Slightly dry mouth.  Dry, warm skin.  Dizziness. Symptoms of moderate dehydration may include:   Very dry mouth.  Muscle cramps.  Dark urine. Urine may be the color of tea.  Decreased urine production.  Decreased tear production.  Heartbeat that is irregular or faster than normal (palpitations).  Headache.  Light-headedness, especially when you stand up from a sitting position.  Fainting (syncope). Symptoms of severe dehydration may include:   Changes in skin, such as:  Cold and clammy skin.  Blotchy (mottled) or pale skin.  Skin that does  not quickly return to normal after being lightly pinched and released (poor skin turgor).  Changes in body fluids, such as:  Extreme thirst.  No tear production.  Inability to sweat when body temperature is high, such as in hot weather.  Very little urine production.  Changes in vital signs, such as:  Weak pulse.  Pulse that is more than 100 beats a minute when sitting still.  Rapid breathing.  Low blood pressure.  Other changes, such as:  Sunken eyes.  Cold hands and feet.  Confusion.  Lack of energy (lethargy).  Difficulty waking up from sleep.  Short-term weight loss.  Unconsciousness. How is this diagnosed? This condition is diagnosed based on your symptoms and a physical exam. Blood and urine tests may be done to help confirm the diagnosis. How is this treated? Treatment for this condition depends on the severity. Mild or moderate dehydration can often be treated at home. Treatment should be started right away. Do not wait until dehydration becomes severe. Severe dehydration is an emergency and it needs to be treated in a hospital. Treatment for mild dehydration may include:   Drinking more fluids.  Replacing salts and minerals in your blood (electrolytes) that you may have lost. Treatment for moderate dehydration may include:   Drinking an oral rehydration solution (ORS). This is a drink that helps you replace fluids and electrolytes (rehydrate). It can be found at pharmacies and retail stores. Treatment for severe dehydration may include:   Receiving fluids through an IV tube.  Receiving an electrolyte solution through a feeding tube that is   passed through your nose and into your stomach (nasogastric tube, or NG tube).  Correcting any abnormalities in electrolytes.  Treating the underlying cause of dehydration. Follow these instructions at home:  If directed by your health care provider, drink an ORS:  Make an ORS by following instructions on the  package.  Start by drinking small amounts, about  cup (120 mL) every 5-10 minutes.  Slowly increase how much you drink until you have taken the amount recommended by your health care provider.  Drink enough clear fluid to keep your urine clear or pale yellow. If you were told to drink an ORS, finish the ORS first, then start slowly drinking other clear fluids. Drink fluids such as:  Water. Do not drink only water. Doing that can lead to having too little salt (sodium) in the body (hyponatremia).  Ice chips.  Fruit juice that you have added water to (diluted fruit juice).  Low-calorie sports drinks.  Avoid:  Alcohol.  Drinks that contain a lot of sugar. These include high-calorie sports drinks, fruit juice that is not diluted, and soda.  Caffeine.  Foods that are greasy or contain a lot of fat or sugar.  Take over-the-counter and prescription medicines only as told by your health care provider.  Do not take sodium tablets. This can lead to having too much sodium in the body (hypernatremia).  Eat foods that contain a healthy balance of electrolytes, such as bananas, oranges, potatoes, tomatoes, and spinach.  Keep all follow-up visits as told by your health care provider. This is important. Contact a health care provider if:  You have abdominal pain that:  Gets worse.  Stays in one area (localizes).  You have a rash.  You have a stiff neck.  You are more irritable than usual.  You are sleepier or more difficult to wake up than usual.  You feel weak or dizzy.  You feel very thirsty.  You have urinated only a small amount of very dark urine over 6-8 hours. Get help right away if:  You have symptoms of severe dehydration.  You cannot drink fluids without vomiting.  Your symptoms get worse with treatment.  You have a fever.  You have a severe headache.  You have vomiting or diarrhea that:  Gets worse.  Does not go away.  You have blood or green matter  (bile) in your vomit.  You have blood in your stool. This may cause stool to look black and tarry.  You have not urinated in 6-8 hours.  You faint.  Your heart rate while sitting still is over 100 beats a minute.  You have trouble breathing. This information is not intended to replace advice given to you by your health care provider. Make sure you discuss any questions you have with your health care provider. Document Released: 01/07/2005 Document Revised: 08/04/2015 Document Reviewed: 03/03/2015 [employer]vier Interactive Patient Education  2017 [employer]  

## 2016-05-17 ENCOUNTER — Ambulatory Visit (HOSPITAL_BASED_OUTPATIENT_CLINIC_OR_DEPARTMENT_OTHER): Payer: Medicare Other | Admitting: Nurse Practitioner

## 2016-05-17 ENCOUNTER — Ambulatory Visit (HOSPITAL_COMMUNITY)
Admission: RE | Admit: 2016-05-17 | Discharge: 2016-05-17 | Disposition: A | Discharge status: Home / Self Care | Payer: Medicare Other | Source: Ambulatory Visit | Attending: Nurse Practitioner | Admitting: Nurse Practitioner

## 2016-05-17 ENCOUNTER — Ambulatory Visit: Payer: Medicare Other

## 2016-05-17 VITALS — BP 121/78 | HR 106 | Temp 98.2°F | Resp 17 | Ht 67.0 in | Wt 123.6 lb

## 2016-05-17 DIAGNOSIS — R109 Unspecified abdominal pain: Secondary | ICD-10-CM | POA: Diagnosis not present

## 2016-05-17 DIAGNOSIS — R1031 Right lower quadrant pain: Secondary | ICD-10-CM | POA: Diagnosis not present

## 2016-05-17 DIAGNOSIS — C3490 Malignant neoplasm of unspecified part of unspecified bronchus or lung: Secondary | ICD-10-CM | POA: Diagnosis not present

## 2016-05-17 DIAGNOSIS — C3491 Malignant neoplasm of unspecified part of right bronchus or lung: Secondary | ICD-10-CM

## 2016-05-17 DIAGNOSIS — C772 Secondary and unspecified malignant neoplasm of intra-abdominal lymph nodes: Secondary | ICD-10-CM | POA: Diagnosis not present

## 2016-05-17 DIAGNOSIS — R599 Enlarged lymph nodes, unspecified: Secondary | ICD-10-CM

## 2016-05-17 DIAGNOSIS — C3411 Malignant neoplasm of upper lobe, right bronchus or lung: Secondary | ICD-10-CM | POA: Diagnosis not present

## 2016-05-17 DIAGNOSIS — R21 Rash and other nonspecific skin eruption: Secondary | ICD-10-CM | POA: Diagnosis not present

## 2016-05-17 DIAGNOSIS — R1110 Vomiting, unspecified: Secondary | ICD-10-CM | POA: Diagnosis not present

## 2016-05-17 DIAGNOSIS — C784 Secondary malignant neoplasm of small intestine: Secondary | ICD-10-CM | POA: Diagnosis not present

## 2016-05-17 MED ORDER — IOPAMIDOL (ISOVUE-300) INJECTION 61%
INTRAVENOUS | Status: AC
  Filled 2016-05-17: qty 100

## 2016-05-17 MED ORDER — IOPAMIDOL (ISOVUE-300) INJECTION 61%
100.0000 mL | Freq: Once | INTRAVENOUS | Status: AC | PRN
  Administered 2016-05-17: 100 mL via INTRAVENOUS

## 2016-05-17 NOTE — Progress Notes (Addendum)
[facility] OFFICE PROGRESS NOTE   Diagnosis:  Metastatic lung cancer  INTERVAL HISTORY:   Brett [name] returns as scheduled. He was seen yesterday for scheduled follow-up at which time he reported a 5 day history of nausea/vomiting, abdominal and back pain and constipation. Abdominal x-ray showed moderate stool volume without obstruction. He received intravenous hydration and was started on a laxative regimen. He had CT scans earlier this morning.  He reports feeling much better. He had a bowel movement this morning. He was able to eat a biscuit and drink some coffee. He last vomited 2 days ago. Back and abdominal pain have "eased off".  He notes a rash on his arms and abdomen. No associated pruritus.  He is having intermittent sharp pain in the right groin region.  Objective:  Vital signs in last 24 hours:  Blood pressure 121/78, pulse (!) 106, temperature 98.2 F (36.8 C), temperature source Oral, resp. rate 17, height '5\' 7"'$  (1.702 m), weight 123 lb 9.6 oz (56.1 kg), SpO2 99 %.    HEENT: No thrush or ulcers. Tongue appears dry. Resp: Lungs clear bilaterally. Cardio: Regular rate and rhythm. GI: Abdomen soft and nontender. Bowel sounds active. No hepatomegaly. No apparent ascites. No obvious right inguinal hernia. Vascular: No leg edema. Neuro: Alert and oriented.  Portacath without erythema.  Lab Results:  Lab Results  Component Value Date   WBC 6.6 05/16/2016   HGB 12.9 (L) 05/16/2016   HCT 39.3 05/16/2016   MCV 78.0 (L) 05/16/2016   PLT 331 05/16/2016   NEUTROABS 4.7 05/16/2016    Imaging:  Ct Abdomen Pelvis W Contrast  Result Date: 05/17/2016 CLINICAL DATA:  Right lung cancer status post resection, with metastasis small bowel status post small bowel resection, chemotherapy ongoing. Abdominal pain, constipation, nausea/ vomiting x 2-3 days. EXAM: CT ABDOMEN AND PELVIS WITH CONTRAST TECHNIQUE: Multidetector CT imaging of the abdomen and pelvis was  performed using the standard protocol following bolus administration of intravenous contrast. CONTRAST:  174m ISOVUE-300 IOPAMIDOL (ISOVUE-300) INJECTION 61% COMPARISON:  04/03/2016. FINDINGS: Lower chest: Lung bases are clear. Hepatobiliary: Liver is within normal limits. No suspicious/enhancing hepatic lesions. Gallbladder is unremarkable. No intrahepatic or extrahepatic ductal dilatation. Pancreas: Within normal limits. Spleen: Within normal limits. Adrenals/Urinary Tract: Adrenal glands are within normal limits. Kidneys are within normal limits.  No hydronephrosis. Bladder is within normal limits. Stomach/Bowel: Stomach is within normal limits. No evidence of bowel obstruction. Normal appendix (series 2/image 34). Moderate left colonic stool burden. Vascular/Lymphatic: No evidence of abdominal aortic aneurysm. Atherosclerotic calcifications of the abdominal aorta and branch vessels. Progressive upper abdominal/retroperitoneal nodal metastases, including: --13 mm short axis gastrohepatic node (series 2/ image 23), previously 6 mm --12 mm short axis portacaval node (series 2/ image 26), previously 8 mm --18 mm short axis necrotic left celiac axis node (series 2/ image 33), previously 13 mm --23 mm short axis necrotic left para-aortic node (series 2/ image 36), previously 13 mm --jejunal mesentery nodes measuring up to 17 mm short axis (series 2/ image 37), necrotic, previously 14 mm Reproductive: Prostate is grossly unremarkable. Other: No abdominopelvic ascites. Possible 16 mm soft tissue implant along the midline anterior abdominal wall (series 2/ image 41), equivocal. Musculoskeletal: Degenerative changes of the visualized thoracolumbar spine. IMPRESSION: Progression of upper abdominal/ retroperitoneal nodal metastases, as above. Possible 16 mm soft tissue implant along the midline anterior abdominal wall. Electronically Signed   By: SJulian HyM.D.   On: 05/17/2016 09:40   Dg Abd 2  Views  Result  Date: 05/16/2016 CLINICAL DATA:  Lung cancer with small intestine metastasis. Abdominal pain. EXAM: ABDOMEN - 2 VIEW COMPARISON:  04/03/2016 abdominal CT FINDINGS: Moderate stool volume, mainly seen from cecum to descending colon. No rectal impaction. Bowel sutures in the right abdomen correlating with the history. No concerning mass effect or calcification. Lung bases are clear. Negative pneumoperitoneum. IMPRESSION: Moderate stool volume without obstruction or rectal impaction. Electronically Signed   By: Monte Fantasia M.D.   On: 05/16/2016 10:33    Medications: I have reviewed the patient's current medications.  Assessment/Plan: 1. Metastatic non-small cell lung cancer, adenocarcinoma status post right upper lobectomy with mediastinal lymph node dissection; metastatic to small intestine status post resection of proximal jejunum and distal jejunum/proximal ileum 02/27/2016; now status post 2 cycles carboplatin/Alimta. 2. Approximate 5 day history of nausea/vomiting, abdominal pain, constipation. Symptoms improved since beginning a laxative regimen. 3. CT scan abdomen/pelvis 05/17/2016-no evidence of bowel obstruction. Progressive upper abdominal/retroperitoneal nodal metastases. Possible 16 mm soft tissue implant along the midline anterior abdominal wall. 4. Rash abdomen and upper extremities. ? Related to IV contrast.    Disposition: Brett [name] appears improved. Symptoms are better. He is having bowel movements and tolerating a diet. We reviewed the CT results with Brett [name] and his wife.   They understand the abdominal adenopathy has progressed. We will review the CT findings with Dr. Julien Nordmann upon his return to the office and contact Brett [name] with recommendations regarding his treatment.   He will continue a laxative regimen with Dulcolax and Miralax.   He has a mild non pruritic rash on the abdomen and arms. We discussed this may be related to the CT contrast and will list as an allergy.    Patient seen with Dr. Benay Spice. 25 minutes were spent face to face at today's visit with the majority of that time involved in counseling/coordination of care.     Ned Card ANP/GNP-BC   05/17/2016  12:19 PM This was a shared visit with Ned Card. Brett [name] was interviewed and examined. We discussed the CT findings with him. There has been mild progression of abdominal adenopathy while on the current chemotherapy regimen. No evidence of a bowel obstruction.  Julieanne Manson, M.D.

## 2016-05-20 ENCOUNTER — Other Ambulatory Visit: Payer: Self-pay | Admitting: Internal Medicine

## 2016-05-20 ENCOUNTER — Other Ambulatory Visit: Payer: Self-pay | Admitting: Nurse Practitioner

## 2016-05-20 DIAGNOSIS — G894 Chronic pain syndrome: Secondary | ICD-10-CM

## 2016-05-20 MED ORDER — HYDROCODONE-ACETAMINOPHEN 10-325 MG PO TABS: 1.0000 | ORAL_TABLET | Freq: Three times a day (TID) | ORAL | 0 refills | Status: DC | PRN

## 2016-05-20 NOTE — Telephone Encounter (Signed)
Medication Refill on   Hydrocodone

## 2016-05-20 NOTE — Telephone Encounter (Signed)
It looks like oncology provided oxycodone-acetaminophen 5-325 mg (disp #30) on 4/26.  This was not reported to me.  His last refill of the hydrocodone was 04/30/16.  I have renewed the hydrocodone to be kept on schedule.  Brett [name] has metastatic cancer which appears to be progressing via mesenteric adenopathy growth while receiving chemotherapy and may have temporarily required the increased opioid therapy.

## 2016-05-20 NOTE — Telephone Encounter (Signed)
Called patient's number & informed wife rx is ready for p/u.

## 2016-05-23 ENCOUNTER — Other Ambulatory Visit (HOSPITAL_BASED_OUTPATIENT_CLINIC_OR_DEPARTMENT_OTHER): Payer: Medicare Other

## 2016-05-23 DIAGNOSIS — C784 Secondary malignant neoplasm of small intestine: Secondary | ICD-10-CM | POA: Diagnosis not present

## 2016-05-23 DIAGNOSIS — C3411 Malignant neoplasm of upper lobe, right bronchus or lung: Secondary | ICD-10-CM

## 2016-05-23 DIAGNOSIS — C3491 Malignant neoplasm of unspecified part of right bronchus or lung: Secondary | ICD-10-CM

## 2016-05-23 LAB — CBC WITH DIFFERENTIAL/PLATELET
BASO%: 0.2 % (ref 0.0–2.0)
Basophils Absolute: 0 10*3/uL (ref 0.0–0.1)
EOS%: 0.9 % (ref 0.0–7.0)
Eosinophils Absolute: 0.1 10*3/uL (ref 0.0–0.5)
HCT: 38.2 % — ABNORMAL LOW (ref 38.4–49.9)
HGB: 12.2 g/dL — ABNORMAL LOW (ref 13.0–17.1)
LYMPH%: 8.2 % — ABNORMAL LOW (ref 14.0–49.0)
MCH: 25.3 pg — ABNORMAL LOW (ref 27.2–33.4)
MCHC: 31.9 g/dL — ABNORMAL LOW (ref 32.0–36.0)
MCV: 79.3 fL (ref 79.3–98.0)
MONO#: 1.1 10*3/uL — ABNORMAL HIGH (ref 0.1–0.9)
MONO%: 11.9 % (ref 0.0–14.0)
NEUT#: 7.4 10*3/uL — ABNORMAL HIGH (ref 1.5–6.5)
NEUT%: 78.8 % — ABNORMAL HIGH (ref 39.0–75.0)
Platelets: 232 10*3/uL (ref 140–400)
RBC: 4.82 10*6/uL (ref 4.20–5.82)
RDW: 21.3 % — ABNORMAL HIGH (ref 11.0–14.6)
WBC: 9.3 10*3/uL (ref 4.0–10.3)
lymph#: 0.8 10*3/uL — ABNORMAL LOW (ref 0.9–3.3)

## 2016-05-23 LAB — COMPREHENSIVE METABOLIC PANEL
ALT: 20 U/L (ref 0–55)
AST: 29 U/L (ref 5–34)
Albumin: 3.3 g/dL — ABNORMAL LOW (ref 3.5–5.0)
Alkaline Phosphatase: 86 U/L (ref 40–150)
Anion Gap: 9 mEq/L (ref 3–11)
BUN: 13.6 mg/dL (ref 7.0–26.0)
CO2: 28 mEq/L (ref 22–29)
Calcium: 9.5 mg/dL (ref 8.4–10.4)
Chloride: 96 mEq/L — ABNORMAL LOW (ref 98–109)
Creatinine: 0.7 mg/dL (ref 0.7–1.3)
EGFR: >90 mL/min/{1.73_m2} (ref >90)
Glucose: 178 mg/dl — ABNORMAL HIGH (ref 70–140)
Potassium: 4.2 mEq/L (ref 3.5–5.1)
Sodium: 133 mEq/L — ABNORMAL LOW (ref 136–145)
Total Bilirubin: 0.44 mg/dL (ref 0.20–1.20)
Total Protein: 6.8 g/dL (ref 6.4–8.3)

## 2016-05-24 ENCOUNTER — Encounter: Payer: Self-pay | Admitting: Internal Medicine

## 2016-05-24 ENCOUNTER — Ambulatory Visit (HOSPITAL_BASED_OUTPATIENT_CLINIC_OR_DEPARTMENT_OTHER): Payer: Medicare Other | Admitting: Internal Medicine

## 2016-05-24 ENCOUNTER — Telehealth: Payer: Self-pay | Admitting: Internal Medicine

## 2016-05-24 VITALS — BP 113/74 | HR 93 | Temp 98.2°F | Resp 20 | Ht 67.0 in | Wt 120.3 lb

## 2016-05-24 DIAGNOSIS — R110 Nausea: Secondary | ICD-10-CM

## 2016-05-24 DIAGNOSIS — E119 Type 2 diabetes mellitus without complications: Secondary | ICD-10-CM

## 2016-05-24 DIAGNOSIS — R630 Anorexia: Secondary | ICD-10-CM | POA: Diagnosis not present

## 2016-05-24 DIAGNOSIS — Z5112 Encounter for antineoplastic immunotherapy: Secondary | ICD-10-CM

## 2016-05-24 DIAGNOSIS — C3491 Malignant neoplasm of unspecified part of right bronchus or lung: Secondary | ICD-10-CM

## 2016-05-24 DIAGNOSIS — I10 Essential (primary) hypertension: Secondary | ICD-10-CM | POA: Diagnosis not present

## 2016-05-24 DIAGNOSIS — C784 Secondary malignant neoplasm of small intestine: Secondary | ICD-10-CM

## 2016-05-24 DIAGNOSIS — Z7189 Other specified counseling: Secondary | ICD-10-CM

## 2016-05-24 DIAGNOSIS — M545 Low back pain: Secondary | ICD-10-CM | POA: Diagnosis not present

## 2016-05-24 DIAGNOSIS — R634 Abnormal weight loss: Secondary | ICD-10-CM

## 2016-05-24 DIAGNOSIS — C3411 Malignant neoplasm of upper lobe, right bronchus or lung: Secondary | ICD-10-CM | POA: Diagnosis not present

## 2016-05-24 DIAGNOSIS — R5382 Chronic fatigue, unspecified: Secondary | ICD-10-CM

## 2016-05-24 DIAGNOSIS — E43 Unspecified severe protein-calorie malnutrition: Secondary | ICD-10-CM

## 2016-05-24 HISTORY — DX: Encounter for antineoplastic immunotherapy: Z51.12

## 2016-05-24 MED ORDER — DRONABINOL 2.5 MG PO CAPS: 2.5000 mg | ORAL_CAPSULE | Freq: Two times a day (BID) | ORAL | 0 refills | Status: DC

## 2016-05-24 NOTE — Progress Notes (Signed)
DISCONTINUE ON PATHWAY REGIMEN - Non-Small Cell Lung     A cycle is every 21 days:     Carboplatin      Pemetrexed      Bevacizumab   **Always confirm dose/schedule in your pharmacy ordering system**    REASON: Disease Progression PRIOR TREATMENT: [id number]: Carboplatin AUC=5 + Pemetrexed 500 mg/m2 + Bevacizumab 15 mg/kg q21 Days x 4 Cycles TREATMENT RESPONSE: Progressive Disease (PD)  START ON PATHWAY REGIMEN - Non-Small Cell Lung     A cycle is 21 days:     Pembrolizumab   **Always confirm dose/schedule in your pharmacy ordering system**    Patient Characteristics: Stage IV Metastatic, Non Squamous, Second Line - Chemotherapy/Immunotherapy, PS = 0, 1, No Prior PD-1/PD-L1  Inhibitor and Immunotherapy Candidate AJCC T Category: T1b Current Disease Status: Distant Metastases AJCC N Category: N0 AJCC M Category: M1c AJCC 8 Stage Grouping: IVB Histology: Non Squamous Cell ROS1 Rearrangement Status: Negative T790M Mutation Status: Not Applicable - EGFR Mutation Negative/Unknown Other Mutations/Biomarkers: No Other Actionable Mutations PD-L1 Expression Status: PD-L1 Positive 1-49% (TPS) Chemotherapy/Immunotherapy LOT: Second Line Chemotherapy/Immunotherapy Molecular Targeted Therapy: Not Appropriate ALK Translocation Status: Negative Would you be surprised if this patient died  in the next year? I would NOT be surprised if this patient died in the next year EGFR Mutation Status: Negative/Wild Type BRAF V600E Mutation Status: Negative Performance Status: PS = 0, 1 Immunotherapy Candidate Status: Candidate for Immunotherapy Prior Immunotherapy Status: No Prior PD-1/PD-L1 Inhibitor  Intent of Therapy: Non-Curative / Palliative Intent, Discussed with Patient

## 2016-05-24 NOTE — Progress Notes (Signed)
[place] [phone]   [phone]  OFFICE PROGRESS NOTE  [name], MD [address] [place] [address]  DIAGNOSIS: Stage IV (T1b, N0, M1b) non-small cell lung cancer, adenocarcinoma presented with right upper lobe lung nodule and recent metastasis to the small intestine. This was initially diagnosed in September 2017.  Genomic Alterations Identified? ERBB2 amplification - equivocal? CDKN2A p16INK4a E88* and p14ARF V291B SMARCA4 splice site [id number]>HF SPTA1 E2022* TOP2A amplification TP53 A159P Additional Findings? Microsatellite status MS-Stable Tumor Mutation Burden TMB-Intermediate; 18 Muts/Mb Additional Disease-relevant Genes with No Reportable Alterations Identified? EGFR KRAS ALK BRAF MET RET ROS1   PRIOR THERAPY:  1) Status post right VATS with right upper lobectomy and mediastinal lymph node dissection under the care of Dr. Roxan Hockey on 10/18/2015 and the final pathology was consistent with stage IA (T1b, N0, MX). 2) upper endoscopy on 01/05/2016 showed normal esophagus, normal stomach but there was occasional mass around 3.0 CM in length circumferential nonobstructing in the jejunum. The final pathology was consistent with metastatic adenocarcinoma. 3) status post laparoscopic laparotomy and resection of proximal lesion and and distal jejunum/proximal ileum under the care of Dr. Hassell Done 1 02/27/2016. 3)  Systemic chemotherapy with carboplatin for AUC of 5 and Alimta 500 MG/M2 every 3 weeks. First dose 04/04/2016. Status post 2 cycles. Last dose was given 04/21/2016 discontinued secondary to disease progression.   CURRENT THERAPY: Second line immunotherapy with Ketruda 200 mg IV every 2 weeks, first dose 05/30/2016.  INTERVAL HISTORY: Brett [name] 56 y.o. male returns to the clinic today for follow-up visit accompanied by his wife. The patient has been complaining of increasing fatigue and weakness. He  also has low back pain and currently on Vicodin during the day and oxycodone at night time. He also has decreased appetite and intermittent nausea and vomiting spatially at nighttime. He underwent 2 cycles of systemic chemotherapy with carboplatin and Alimta. He tolerated the treatment well except for the nausea and fatigue. He is expected to start cycle #3 last week but because of his abdominal pain and back pain as well as fatigue his treatment was held. The patient underwent CT scan of the abdomen and pelvis last week which unfortunately showed evidence for disease progression. He denied having any current chest pain, shortness breath, cough or hemoptysis. He has no fever or chills. He denied having any headache or visual changes. He is here today for evaluation and discussion of his treatment options.  MEDICAL HISTORY: Past Medical History:  Diagnosis Date  . Anemia 11/28/2015  . Anxiety   . Arthritis    "hands, back" (11/28/2015)  . Barrett's esophagus 07/01/2013   Without dysplasia on biopsy 09/03/2012. Repeat EGD recommended 08/2015  . Carotid artery stenosis 07/01/2013   Requiring right sided stent   . Chronic pain syndrome 07/01/2013  . Closed head injury with brief loss of consciousness ([place]) 07/22/2010   Head trapped in a hydraulic device at work.  Fracture of orbital bones on right and brief loss of consciousness per report.  . 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 There was likely a strong psycho-social overlay in regard to the cognitive deficits in the form of mood disorder with psychotic features and mixed anxiety symptomatology. His primary tested cognitive deficits are in the areas of attention, executi  . Daily headache "since 07/2010"   constantly  .  Degenerative joint disease of cervical spine 07/01/2013  . Dehydration 04/11/2016  .  Dupuytren's contracture of both hands 04/08/2014  . Encounter for antineoplastic chemotherapy 10/05/2015  . Erectile dysfunction associated with type 2 diabetes mellitus ([place]) 07/01/2013  . Fibromyalgia 07/01/2013  . Goals of care, counseling/discussion 03/28/2016  . History of blood transfusion 11/28/2015   "suppose to get his first today" (11/28/2015)  . Hyperlipidemia LDL goal < 100 07/01/2013  . Intractable hiccups 04/11/2016  . 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 Memory changes    "memory issues" from head injury  . Osteoarthritis of right thumb 10/21/2014  . Peripheral vascular occlusive disease ([place]) 07/01/2013   Requiring 2 arterial stents above the left knee per report  . Pneumonia ~ 2006/2007  . Post traumatic stress disorder 07/01/2013  . Primary lung adenocarcinoma ([place]) dx'd 08/2015   "right lung"  . Severe major depression with psychotic features ([place]) 04/15/2011  . Tobacco abuse 07/01/2013  . Tobacco abuse   . Type 2 diabetes mellitus with vascular disease ([place]) 07/01/2013   Left lower extremity and right carotid stenting    ALLERGIES:  is allergic to gabapentin; lyrica [pregabalin]; celebrex [celecoxib]; and contrast media [iodinated diagnostic agents].  MEDICATIONS:  Current Outpatient Prescriptions  Medication Sig Dispense Refill  . aspirin EC 81 MG tablet Take 81 mg by mouth daily.     Marland Kitchen atorvastatin (LIPITOR) 40 MG tablet Take 1 tablet (40 mg total) by mouth daily. (Patient taking differently: Take 40 mg by mouth every evening. ) 90 tablet 3  . baclofen (LIORESAL) 10 MG tablet TAKE 1 TABLET(10 MG) BY MOUTH THREE TIMES DAILY (Patient not taking: Reported on 04/25/2016) 270 tablet 0  . chlorproMAZINE (THORAZINE) 25 MG tablet TAKE 1 TABLET BY MOUTH THREE TIMES DAILY AS NEEDED (Patient not taking: Reported on 04/25/2016) 270 tablet 0  . clopidogrel (PLAVIX) 75 MG tablet Take 75 mg by mouth at bedtime.     Marland Kitchen dexamethasone (DECADRON) 4 MG tablet Take 1 tablet (4 mg total) by mouth  2 (two) times daily with a meal. (Patient not taking: Reported on 05/16/2016) 40 tablet 1  . Ferrous Sulfate (IRON) 325 (65 Fe) MG TABS Take 325 mg by mouth 2 (two) times daily with a meal. 585 each 0  . folic acid (FOLVITE) 1 MG tablet Take 1 tablet (1 mg total) by mouth daily. 30 tablet 4  . glipiZIDE (GLUCOTROL) 10 MG tablet Take 2 tablets (20 mg total) by mouth 2 (two) times daily before a meal. 360 tablet 3  . HYDROcodone-acetaminophen (NORCO) 10-325 MG tablet Take 1 tablet by mouth every 8 (eight) hours as needed for severe pain. 90 tablet 0  . lidocaine-prilocaine (EMLA) cream Apply 1 application topically as needed. (Patient not taking: Reported on 05/16/2016) 30 g 0  . lisinopril (PRINIVIL,ZESTRIL) 5 MG tablet Take 0.5 tablets (2.5 mg total) by mouth daily. 90 tablet 3  . metFORMIN (GLUCOPHAGE) 500 MG tablet Take 1 tablet (500 mg total) by mouth 2 (two) times daily with a meal. 180 tablet 3  . metoCLOPramide (REGLAN) 10 MG tablet Take 1 tablet (10 mg total) by mouth 4 (four) times daily as needed (hiccoughs). 30 tablet 0  . pantoprazole (PROTONIX) 40 MG tablet TAKE 1 TABLET(40 MG) BY MOUTH DAILY 90 tablet 0  . prochlorperazine (COMPAZINE) 10 MG tablet Take 1 tablet (10 mg total) by mouth every 6 (six) hours as needed for nausea or vomiting. 30 tablet 0  . sitaGLIPtin (JANUVIA) 100 MG tablet Take  1 tablet (100 mg total) by mouth daily. (Patient taking differently: Take 100 mg by mouth at bedtime. ) 90 tablet 3  . sorbitol 70 % solution Take 15 cc every 4 hours until bowel movement 473 mL 0   No current facility-administered medications for this visit.    Facility-Administered Medications Ordered in Other Visits  Medication Dose Route Frequency Provider Last Rate Last Dose  . sodium chloride flush (NS) 0.9 % injection 10 mL  10 mL Intracatheter PRN Nicholas Lose, MD   10 mL at 04/05/16 1641    SURGICAL HISTORY:  Past Surgical History:  Procedure Laterality Date  . CAROTID STENT Right  ?2014  . COLONOSCOPY N/A 11/30/2015   Procedure: COLONOSCOPY;  Surgeon: Teena Irani, MD;  Location: [facility] ENDOSCOPY;  Service: Endoscopy;  Laterality: N/A;  . ESOPHAGOGASTRODUODENOSCOPY N/A 11/30/2015   Procedure: ESOPHAGOGASTRODUODENOSCOPY (EGD);  Surgeon: Teena Irani, MD;  Location: [facility] ENDOSCOPY;  Service: Endoscopy;  Laterality: N/A;  . ESOPHAGOGASTRODUODENOSCOPY (EGD) WITH PROPOFOL N/A 01/05/2016   Procedure: ESOPHAGOGASTRODUODENOSCOPY (EGD) WITH PROPOFOL;  Surgeon: Teena Irani, MD;  Location: [facility] ENDOSCOPY;  Service: Endoscopy;  Laterality: N/A;  . FEMORAL ARTERY STENT Left 05/2012; ~ 2015   Archie Endo 06/04/2012; Raechel Chute report  . FRACTURE SURGERY    . GIVENS CAPSULE STUDY N/A 12/22/2015   Procedure: GIVENS CAPSULE STUDY;  Surgeon: Wonda Horner, MD;  Location: [facility] ENDOSCOPY;  Service: Endoscopy;  Laterality: N/A;  . HARDWARE REMOVAL Right 11/15/2011   Removal of deep frontozygomatic orbital hardware/notes 11/15/2011  . HERNIA REPAIR  [id number]   Umbilical  . LAPAROSCOPY N/A 02/27/2016   Procedure: LAPAROSCOPY, LAPAROTOMY  WITH TWO SMALL BOWEL RESECTION;  Surgeon: Johnathan Hausen, MD;  Location: [facility] ORS;  Service: General;  Laterality: N/A;  . ORIF ORBITAL FRACTURE Right 08/15/2010    caught in a hydraulic machine; open reduction internal fixation of orbital rim fracture and open reduction of zygomatic arch fracture  Archie Endo 10/13/2009  . PORTACATH PLACEMENT Left 04/04/2016   Procedure: INSERTION PORT-A-CATH LEFT CHEST;  Surgeon: Melrose Nakayama, MD;  Location: [place];  Service: Thoracic;  Laterality: Left;  Marland Kitchen VIDEO ASSISTED THORACOSCOPY (VATS)/ LOBECTOMY Right 10/18/2015   Procedure: VIDEO ASSISTED THORACOSCOPY (VATS)/ LOBECTOMY;  Surgeon: Melrose Nakayama, MD;  Location: [place];  Service: Thoracic;  Laterality: Right;  Marland Kitchen VIDEO BRONCHOSCOPY Bilateral 09/21/2015   Procedure: VIDEO BRONCHOSCOPY WITH FLUORO;  Surgeon: Juanito Doom, MD;  Location: [facility] ENDOSCOPY;  Service: Cardiopulmonary;  Laterality:  Bilateral;    REVIEW OF SYSTEMS:  Constitutional: positive for anorexia, fatigue and weight loss Eyes: negative Ears, nose, mouth, throat, and face: negative Respiratory: negative Cardiovascular: negative Gastrointestinal: positive for abdominal pain, nausea and vomiting Genitourinary:negative Integument/breast: negative Hematologic/lymphatic: negative Musculoskeletal:positive for back pain Neurological: negative Behavioral/Psych: negative Endocrine: negative Allergic/Immunologic: negative   PHYSICAL EXAMINATION: General appearance: alert, cooperative, fatigued and no distress Head: Normocephalic, without obvious abnormality, atraumatic Neck: no adenopathy, no JVD, supple, symmetrical, trachea midline and thyroid not enlarged, symmetric, no tenderness/mass/nodules Lymph nodes: Cervical, supraclavicular, and axillary nodes normal. Resp: clear to auscultation bilaterally Back: symmetric, no curvature. ROM normal. No CVA tenderness. Cardio: regular rate and rhythm, S1, S2 normal, no murmur, click, rub or gallop GI: soft, non-tender; bowel sounds normal; no masses,  no organomegaly Extremities: extremities normal, atraumatic, no cyanosis or edema Neurologic: Alert and oriented X 3, normal strength and tone. Normal symmetric reflexes. Normal coordination and gait  ECOG PERFORMANCE STATUS: 1 - Symptomatic but completely ambulatory  Blood pressure 113/74, pulse 93, temperature 98.2  F (36.8 C), temperature source Oral, resp. rate 20, height 5' 7"  (1.702 m), weight 120 lb 4.8 oz (54.6 kg), SpO2 100 %.  LABORATORY DATA: Lab Results  Component Value Date   WBC 9.3 05/23/2016   HGB 12.2 (L) 05/23/2016   HCT 38.2 (L) 05/23/2016   MCV 79.3 05/23/2016   PLT 232 05/23/2016      Chemistry      Component Value Date/Time   NA 133 (L) 05/23/2016 1036   K 4.2 05/23/2016 1036   CL 103 04/04/2016 1206   CO2 28 05/23/2016 1036   BUN 13.6 05/23/2016 1036   CREATININE 0.7 05/23/2016 1036        Component Value Date/Time   CALCIUM 9.5 05/23/2016 1036   ALKPHOS 86 05/23/2016 1036   AST 29 05/23/2016 1036   ALT 20 05/23/2016 1036   BILITOT 0.44 05/23/2016 1036       RADIOGRAPHIC STUDIES: Ct Abdomen Pelvis W Contrast  Result Date: 05/17/2016 CLINICAL DATA:  Right lung cancer status post resection, with metastasis small bowel status post small bowel resection, chemotherapy ongoing. Abdominal pain, constipation, nausea/ vomiting x 2-3 days. EXAM: CT ABDOMEN AND PELVIS WITH CONTRAST TECHNIQUE: Multidetector CT imaging of the abdomen and pelvis was performed using the standard protocol following bolus administration of intravenous contrast. CONTRAST:  188m ISOVUE-300 IOPAMIDOL (ISOVUE-300) INJECTION 61% COMPARISON:  04/03/2016. FINDINGS: Lower chest: Lung bases are clear. Hepatobiliary: Liver is within normal limits. No suspicious/enhancing hepatic lesions. Gallbladder is unremarkable. No intrahepatic or extrahepatic ductal dilatation. Pancreas: Within normal limits. Spleen: Within normal limits. Adrenals/Urinary Tract: Adrenal glands are within normal limits. Kidneys are within normal limits.  No hydronephrosis. Bladder is within normal limits. Stomach/Bowel: Stomach is within normal limits. No evidence of bowel obstruction. Normal appendix (series 2/image 34). Moderate left colonic stool burden. Vascular/Lymphatic: No evidence of abdominal aortic aneurysm. Atherosclerotic calcifications of the abdominal aorta and branch vessels. Progressive upper abdominal/retroperitoneal nodal metastases, including: --13 mm short axis gastrohepatic node (series 2/ image 23), previously 6 mm --12 mm short axis portacaval node (series 2/ image 26), previously 8 mm --18 mm short axis necrotic left celiac axis node (series 2/ image 33), previously 13 mm --23 mm short axis necrotic left para-aortic node (series 2/ image 36), previously 13 mm --jejunal mesentery nodes measuring up to 17 mm short axis (series 2/  image 37), necrotic, previously 14 mm Reproductive: Prostate is grossly unremarkable. Other: No abdominopelvic ascites. Possible 16 mm soft tissue implant along the midline anterior abdominal wall (series 2/ image 41), equivocal. Musculoskeletal: Degenerative changes of the visualized thoracolumbar spine. IMPRESSION: Progression of upper abdominal/ retroperitoneal nodal metastases, as above. Possible 16 mm soft tissue implant along the midline anterior abdominal wall. Electronically Signed   By: SJulian HyM.D.   On: 05/17/2016 09:40   Dg Abd 2 Views  Result Date: 05/16/2016 CLINICAL DATA:  Lung cancer with small intestine metastasis. Abdominal pain. EXAM: ABDOMEN - 2 VIEW COMPARISON:  04/03/2016 abdominal CT FINDINGS: Moderate stool volume, mainly seen from cecum to descending colon. No rectal impaction. Bowel sutures in the right abdomen correlating with the history. No concerning mass effect or calcification. Lung bases are clear. Negative pneumoperitoneum. IMPRESSION: Moderate stool volume without obstruction or rectal impaction. Electronically Signed   By: JMonte FantasiaM.D.   On: 05/16/2016 10:33     ASSESSMENT AND PLAN:  This is a very pleasant 56years old white male with metastatic non-small cell lung cancer, adenocarcinoma status post right upper lobectomy with  lymph node dissection. Several weeks later after his surgeries the patient was found to have metastatic disease in the jejunum and he underwent surgical resection of the proximal and distal jejunum as well as the proximal ileum. He had residual disease before starting systemic chemotherapy. He underwent 2 cycles of systemic chemotherapy with carboplatin and Alimta and has rough time with this treatment including nausea and vomiting as well as fatigue. Unfortunately the recent CT scan of the abdomen and pelvis showed further evidence for disease progression. I personally and independently reviewed the scan images and discuss the  results with the patient and his wife. I recommended for the patient to discontinue his current treatment with carboplatin and Alimta. I discussed with the patient other options for management of his condition including palliative care or second line treatment with immunotherapy. The patient and his wife understand his poor prognosis. They also understand that he has incurable condition and on the treatment will be of palliative nature. The patient his wife would like to proceed with the immunotherapy. I discussed with them the adverse effect of this treatment including but not limited to immune mediated the skin rash, diarrhea, inflammation of the lung, kidney, liver, thyroid or other endocrine dysfunction. He is expected to start the first cycle of this treatment next week. For the lack of appetite and weight loss, I started the patient on Marinol 2.5 mg by mouth twice a day. For pain management, he will continue on Vicodin during the day and he prefers to take Percocet in at nighttime. For diabetes mellitus he is currently on Januvia, metformin and Glucotrol. For hypertension, the patient will continue we'll continue his current treatment with lisinopril. For the nausea and vomiting the patient is currently on Compazine as well as Reglan. He would also benefit from the treatment with Marinol. I will see him back for follow-up visit in 4 weeks for evaluation before starting cycle #2. The patient voices understanding of current disease status and treatment options and is in agreement with the current care plan. All questions were answered. The patient knows to call the clinic with any problems, questions or concerns. We can certainly see the patient much sooner if necessary.  Disclaimer: This note was dictated with voice recognition software. Similar sounding words can inadvertently be transcribed and may not be corrected upon review.

## 2016-05-24 NOTE — Telephone Encounter (Signed)
Gave patient AVS . Not able to schedule per LOS due to capped day next week -sent schedule message to Normajean Baxter cc Dr. Julien Nordmann.  Will contact patient when appts are scheduled.

## 2016-05-25 ENCOUNTER — Encounter: Payer: Self-pay | Admitting: Internal Medicine

## 2016-05-27 ENCOUNTER — Telehealth: Payer: Self-pay | Admitting: Medical Oncology

## 2016-05-27 ENCOUNTER — Encounter: Payer: Self-pay | Admitting: Internal Medicine

## 2016-05-27 ENCOUNTER — Other Ambulatory Visit: Payer: Self-pay | Admitting: Medical Oncology

## 2016-05-27 ENCOUNTER — Telehealth: Payer: Self-pay | Admitting: Internal Medicine

## 2016-05-27 DIAGNOSIS — C3491 Malignant neoplasm of unspecified part of right bronchus or lung: Secondary | ICD-10-CM

## 2016-05-27 MED ORDER — OXYCODONE-ACETAMINOPHEN 10-325 MG PO TABS: 1.0000 | ORAL_TABLET | Freq: Four times a day (QID) | ORAL | 0 refills | Status: DC | PRN

## 2016-05-27 NOTE — Plan of Care (Signed)
Pt taking oxycodone 5/325 ( 2 tablets ) every 6 hours and has 1 tablet left. Dr Julien Nordmann increased oxycodone from 5/325 mg tablets to 10/325 every 6 hours prn

## 2016-05-27 NOTE — Progress Notes (Signed)
Received PA for Dronabinol.  Called [facility]([place]a) to initiate PA.  PA approved 05/27/16-11/27/16.  Called [facility]([place]) to advise of approval.

## 2016-05-27 NOTE — Telephone Encounter (Signed)
Called to confirmed appt per 5/4 los. - Patients wife is aware patient to start treatment on 5/17 okay per MM due to availability in the treatment area.

## 2016-05-27 NOTE — Telephone Encounter (Signed)
Needs refill for oxycodone.

## 2016-05-28 ENCOUNTER — Encounter: Payer: Self-pay | Admitting: *Deleted

## 2016-05-28 NOTE — Progress Notes (Signed)
Oncology Nurse Navigator Documentation  Oncology Nurse Navigator Flowsheets 05/28/2016  Navigator Location [facility]  Navigator Encounter Type Treatment/I spoke with Brett [name] wife yesterday.  She was tearful and updated me on how he was doing.  I listened as she explained.  I offered her support and encouragement through this difficult time.   Treatment Phase Treatment  Barriers/Navigation Needs (No Data)  Interventions Other  Acuity Level 2  Time Spent with Patient 30

## 2016-05-30 ENCOUNTER — Other Ambulatory Visit: Payer: Self-pay

## 2016-06-04 ENCOUNTER — Telehealth: Payer: Self-pay | Admitting: Medical Oncology

## 2016-06-04 NOTE — Telephone Encounter (Signed)
vomiting not as bad as nausea and poor appetite . Zofran refill requested. Needs PA so I sent it to [facility].

## 2016-06-05 ENCOUNTER — Encounter: Payer: Self-pay | Admitting: Internal Medicine

## 2016-06-05 NOTE — Progress Notes (Signed)
Called pharmacy on file to get info to initiate auth for Zofran.  Was told by pharmacy staff the PA was approved and was picked up by the pt.

## 2016-06-06 ENCOUNTER — Ambulatory Visit (HOSPITAL_BASED_OUTPATIENT_CLINIC_OR_DEPARTMENT_OTHER): Payer: Medicare Other

## 2016-06-06 ENCOUNTER — Other Ambulatory Visit: Payer: Self-pay

## 2016-06-06 ENCOUNTER — Ambulatory Visit: Payer: Self-pay | Admitting: Internal Medicine

## 2016-06-06 ENCOUNTER — Other Ambulatory Visit (HOSPITAL_BASED_OUTPATIENT_CLINIC_OR_DEPARTMENT_OTHER): Payer: Medicare Other

## 2016-06-06 VITALS — BP 108/73 | HR 80 | Temp 98.6°F | Resp 17

## 2016-06-06 DIAGNOSIS — C3411 Malignant neoplasm of upper lobe, right bronchus or lung: Secondary | ICD-10-CM

## 2016-06-06 DIAGNOSIS — C784 Secondary malignant neoplasm of small intestine: Secondary | ICD-10-CM

## 2016-06-06 DIAGNOSIS — R5382 Chronic fatigue, unspecified: Secondary | ICD-10-CM

## 2016-06-06 DIAGNOSIS — Z79899 Other long term (current) drug therapy: Secondary | ICD-10-CM | POA: Diagnosis not present

## 2016-06-06 DIAGNOSIS — Z5112 Encounter for antineoplastic immunotherapy: Secondary | ICD-10-CM

## 2016-06-06 DIAGNOSIS — C3491 Malignant neoplasm of unspecified part of right bronchus or lung: Secondary | ICD-10-CM

## 2016-06-06 LAB — CBC WITH DIFFERENTIAL/PLATELET
BASO%: 0.9 % (ref 0.0–2.0)
Basophils Absolute: 0.1 10*3/uL (ref 0.0–0.1)
EOS%: 1.3 % (ref 0.0–7.0)
Eosinophils Absolute: 0.1 10*3/uL (ref 0.0–0.5)
HCT: 38.2 % — ABNORMAL LOW (ref 38.4–49.9)
HGB: 12.5 g/dL — ABNORMAL LOW (ref 13.0–17.1)
LYMPH%: 10.7 % — ABNORMAL LOW (ref 14.0–49.0)
MCH: 26.6 pg — ABNORMAL LOW (ref 27.2–33.4)
MCHC: 32.8 g/dL (ref 32.0–36.0)
MCV: 80.9 fL (ref 79.3–98.0)
MONO#: 0.8 10*3/uL (ref 0.1–0.9)
MONO%: 11 % (ref 0.0–14.0)
NEUT#: 5.2 10*3/uL (ref 1.5–6.5)
NEUT%: 76.1 % — ABNORMAL HIGH (ref 39.0–75.0)
Platelets: 366 10*3/uL (ref 140–400)
RBC: 4.72 10*6/uL (ref 4.20–5.82)
RDW: 23.2 % — ABNORMAL HIGH (ref 11.0–14.6)
WBC: 6.9 10*3/uL (ref 4.0–10.3)
lymph#: 0.7 10*3/uL — ABNORMAL LOW (ref 0.9–3.3)

## 2016-06-06 LAB — TSH: TSH: 2.761 m(IU)/L (ref 0.320–4.118)

## 2016-06-06 LAB — COMPREHENSIVE METABOLIC PANEL
ALT: 22 U/L (ref 0–55)
AST: 38 U/L — ABNORMAL HIGH (ref 5–34)
Albumin: 3.3 g/dL — ABNORMAL LOW (ref 3.5–5.0)
Alkaline Phosphatase: 88 U/L (ref 40–150)
Anion Gap: 8 mEq/L (ref 3–11)
BUN: 13.2 mg/dL (ref 7.0–26.0)
CO2: 28 mEq/L (ref 22–29)
Calcium: 9.4 mg/dL (ref 8.4–10.4)
Chloride: 99 mEq/L (ref 98–109)
Creatinine: 0.8 mg/dL (ref 0.7–1.3)
EGFR: >90 mL/min/{1.73_m2} (ref >90)
Glucose: 209 mg/dl — ABNORMAL HIGH (ref 70–140)
Potassium: 4.4 mEq/L (ref 3.5–5.1)
Sodium: 134 mEq/L — ABNORMAL LOW (ref 136–145)
Total Bilirubin: 0.32 mg/dL (ref 0.20–1.20)
Total Protein: 6.7 g/dL (ref 6.4–8.3)

## 2016-06-06 LAB — UA PROTEIN, DIPSTICK - CHCC: Protein, ur: 30 mg/dL

## 2016-06-06 MED ORDER — HEPARIN SOD (PORK) LOCK FLUSH 100 UNIT/ML IV SOLN
500.0000 [IU] | Freq: Once | INTRAVENOUS | Status: AC | PRN
  Administered 2016-06-06: 500 [IU]
  Filled 2016-06-06: qty 5

## 2016-06-06 MED ORDER — SODIUM CHLORIDE 0.9 % IV SOLN
Freq: Once | INTRAVENOUS | Status: AC
  Administered 2016-06-06: 12:00:00 via INTRAVENOUS

## 2016-06-06 MED ORDER — SODIUM CHLORIDE 0.9% FLUSH
10.0000 mL | INTRAVENOUS | Status: DC | PRN
  Administered 2016-06-06: 10 mL
  Filled 2016-06-06: qty 10

## 2016-06-06 MED ORDER — SODIUM CHLORIDE 0.9 % IV SOLN
200.0000 mg | Freq: Once | INTRAVENOUS | Status: AC
  Administered 2016-06-06: 200 mg via INTRAVENOUS
  Filled 2016-06-06: qty 8

## 2016-06-06 NOTE — Patient Instructions (Addendum)
[facility] Discharge Instructions for Patients Receiving Chemotherapy  Today you received the following chemotherapy agents Keytruda  To help prevent nausea and vomiting after your treatment, we encourage you to take your nausea medication as directed  If you develop nausea and vomiting that is not controlled by your nausea medication, call the clinic.   BELOW ARE SYMPTOMS THAT SHOULD BE REPORTED IMMEDIATELY:  *FEVER GREATER THAN 100.5 F  *CHILLS WITH OR WITHOUT FEVER  NAUSEA AND VOMITING THAT IS NOT CONTROLLED WITH YOUR NAUSEA MEDICATION  *UNUSUAL SHORTNESS OF BREATH  *UNUSUAL BRUISING OR BLEEDING  TENDERNESS IN MOUTH AND THROAT WITH OR WITHOUT PRESENCE OF ULCERS  *URINARY PROBLEMS  *BOWEL PROBLEMS  UNUSUAL RASH Items with * indicate a potential emergency and should be followed up as soon as possible.  Feel free to call the clinic you have any questions or concerns. The clinic phone number is [phone].  Please show the CHEMO ALERT CARD at check-in to the Emergency Department and triage nurse.  Pembrolizumab injection What is this medicine? PEMBROLIZUMAB (pem broe liz ue mab) is a monoclonal antibody. It is used to treat melanoma, head and neck cancer, Hodgkin lymphoma, non-small cell lung cancer, urothelial cancer, stomach cancer, and cancers that have a certain genetic condition. This medicine may be used for other purposes; ask your health care provider or pharmacist if you have questions. COMMON BRAND NAME(S): Keytruda What should I tell my health care provider before I take this medicine? They need to know if you have any of these conditions: -diabetes -immune system problems -inflammatory bowel disease -liver disease -lung or breathing disease -lupus -organ transplant -an unusual or allergic reaction to pembrolizumab, other medicines, foods, dyes, or preservatives -pregnant or trying to get pregnant -breast-feeding How should I use this  medicine? This medicine is for infusion into a vein. It is given by a health care professional in a hospital or clinic setting. A special MedGuide will be given to you before each treatment. Be sure to read this information carefully each time. Talk to your pediatrician regarding the use of this medicine in children. While this drug may be prescribed for selected conditions, precautions do apply. Overdosage: If you think you have taken too much of this medicine contact a poison control center or emergency room at once. NOTE: This medicine is only for you. Do not share this medicine with others. What if I miss a dose? It is important not to miss your dose. Call your doctor or health care professional if you are unable to keep an appointment. What may interact with this medicine? Interactions have not been studied. Give your health care provider a list of all the medicines, herbs, non-prescription drugs, or dietary supplements you use. Also tell them if you smoke, drink alcohol, or use illegal drugs. Some items may interact with your medicine. This list may not describe all possible interactions. Give your health care provider a list of all the medicines, herbs, non-prescription drugs, or dietary supplements you use. Also tell them if you smoke, drink alcohol, or use illegal drugs. Some items may interact with your medicine. What should I watch for while using this medicine? Your condition will be monitored carefully while you are receiving this medicine. You may need blood work done while you are taking this medicine. Do not become pregnant while taking this medicine or for 4 months after stopping it. Women should inform their doctor if they wish to become pregnant or think they might be pregnant.   There is a potential for serious side effects to an unborn child. Talk to your health care professional or pharmacist for more information. Do not breast-feed an infant while taking this medicine or for 4  months after the last dose. What side effects may I notice from receiving this medicine? Side effects that you should report to your doctor or health care professional as soon as possible: -allergic reactions like skin rash, itching or hives, swelling of the face, lips, or tongue -bloody or black, tarry -breathing problems -changes in vision -chest pain -chills -constipation -cough -dizziness or feeling faint or lightheaded -fast or irregular heartbeat -fever -flushing -hair loss -low blood counts - this medicine may decrease the number of white blood cells, red blood cells and platelets. You may be at increased risk for infections and bleeding. -muscle pain -muscle weakness -persistent headache -signs and symptoms of high blood sugar such as dizziness; dry mouth; dry skin; fruity breath; nausea; stomach pain; increased hunger or thirst; increased urination -signs and symptoms of kidney injury like trouble passing urine or change in the amount of urine -signs and symptoms of liver injury like dark urine, light-colored stools, loss of appetite, nausea, right upper belly pain, yellowing of the eyes or skin -stomach pain -sweating -weight loss Side effects that usually do not require medical attention (report to your doctor or health care professional if they continue or are bothersome): -decreased appetite -diarrhea -tiredness This list may not describe all possible side effects. Call your doctor for medical advice about side effects. You may report side effects to FDA at 1-800-FDA-1088. Where should I keep my medicine? This drug is given in a hospital or clinic and will not be stored at home. NOTE: This sheet is a summary. It may not cover all possible information. If you have questions about this medicine, talk to your doctor, pharmacist, or health care provider.  2018 Elsevier/Gold Standard (2015-10-17 12:29:36)  

## 2016-06-07 ENCOUNTER — Telehealth: Payer: Self-pay | Admitting: Medical Oncology

## 2016-06-07 ENCOUNTER — Other Ambulatory Visit: Payer: Self-pay | Admitting: Medical Oncology

## 2016-06-07 DIAGNOSIS — R110 Nausea: Secondary | ICD-10-CM

## 2016-06-07 MED ORDER — ONDANSETRON HCL 4 MG PO TABS: 4.0000 mg | ORAL_TABLET | Freq: Three times a day (TID) | ORAL | 0 refills | Status: DC

## 2016-06-07 NOTE — Telephone Encounter (Signed)
Pt went out riding in car yesterday and did well after Keytruda.Today he is tired with more low back pain. He is taking his pain medication. Wife asked me if I heard of "food grade H202" for cancer treatment. I told her no and I did not think Julien Nordmann endorses it. I told her to discuss with Julien Nordmann at next appt.I confirmed next appts.

## 2016-06-12 ENCOUNTER — Telehealth: Payer: Self-pay | Admitting: Medical Oncology

## 2016-06-12 DIAGNOSIS — C3491 Malignant neoplasm of unspecified part of right bronchus or lung: Secondary | ICD-10-CM

## 2016-06-12 MED ORDER — OXYCODONE-ACETAMINOPHEN 10-325 MG PO TABS: 1.0000 | ORAL_TABLET | Freq: Four times a day (QID) | ORAL | 0 refills | Status: DC | PRN

## 2016-06-12 NOTE — Telephone Encounter (Signed)
Returned call to wife about a medication. No answer.

## 2016-06-12 NOTE — Telephone Encounter (Signed)
Pt still having mild vomiting. Wife asked if marinol can cause GI problems. I told her to have pt take one a day instead of two.

## 2016-06-12 NOTE — Addendum Note (Signed)
Addended by: Ardeen Garland on: 06/12/2016 02:23 PM   Modules accepted: Orders

## 2016-06-13 ENCOUNTER — Other Ambulatory Visit: Payer: Self-pay

## 2016-06-15 ENCOUNTER — Other Ambulatory Visit: Payer: Self-pay | Admitting: Oncology

## 2016-06-15 DIAGNOSIS — C3491 Malignant neoplasm of unspecified part of right bronchus or lung: Secondary | ICD-10-CM

## 2016-06-15 DIAGNOSIS — R066 Hiccough: Secondary | ICD-10-CM

## 2016-06-20 ENCOUNTER — Other Ambulatory Visit: Payer: Self-pay

## 2016-06-21 ENCOUNTER — Ambulatory Visit (INDEPENDENT_AMBULATORY_CARE_PROVIDER_SITE_OTHER): Payer: Medicare Other | Admitting: Internal Medicine

## 2016-06-21 ENCOUNTER — Encounter: Payer: Self-pay | Admitting: Internal Medicine

## 2016-06-21 VITALS — BP 86/61 | HR 94 | Temp 98.2°F | Wt 112.7 lb

## 2016-06-21 DIAGNOSIS — G894 Chronic pain syndrome: Secondary | ICD-10-CM

## 2016-06-21 DIAGNOSIS — C3490 Malignant neoplasm of unspecified part of unspecified bronchus or lung: Secondary | ICD-10-CM

## 2016-06-21 DIAGNOSIS — C799 Secondary malignant neoplasm of unspecified site: Secondary | ICD-10-CM

## 2016-06-21 DIAGNOSIS — R112 Nausea with vomiting, unspecified: Secondary | ICD-10-CM | POA: Diagnosis not present

## 2016-06-21 DIAGNOSIS — E43 Unspecified severe protein-calorie malnutrition: Secondary | ICD-10-CM

## 2016-06-21 DIAGNOSIS — T451X5A Adverse effect of antineoplastic and immunosuppressive drugs, initial encounter: Secondary | ICD-10-CM | POA: Insufficient documentation

## 2016-06-21 DIAGNOSIS — F4310 Post-traumatic stress disorder, unspecified: Secondary | ICD-10-CM | POA: Diagnosis not present

## 2016-06-21 DIAGNOSIS — G893 Neoplasm related pain (acute) (chronic): Secondary | ICD-10-CM | POA: Diagnosis not present

## 2016-06-21 DIAGNOSIS — C3491 Malignant neoplasm of unspecified part of right bronchus or lung: Secondary | ICD-10-CM

## 2016-06-21 DIAGNOSIS — F17211 Nicotine dependence, cigarettes, in remission: Secondary | ICD-10-CM

## 2016-06-21 DIAGNOSIS — X58XXXD Exposure to other specified factors, subsequent encounter: Secondary | ICD-10-CM

## 2016-06-21 DIAGNOSIS — Z79891 Long term (current) use of opiate analgesic: Secondary | ICD-10-CM

## 2016-06-21 DIAGNOSIS — E1159 Type 2 diabetes mellitus with other circulatory complications: Secondary | ICD-10-CM

## 2016-06-21 MED ORDER — PROCHLORPERAZINE 25 MG RE SUPP: 25.0000 mg | Freq: Two times a day (BID) | RECTAL | 11 refills | Status: DC | PRN

## 2016-06-21 MED ORDER — OXYCODONE-ACETAMINOPHEN 10-325 MG PO TABS: 1.0000 | ORAL_TABLET | ORAL | 0 refills | Status: DC | PRN

## 2016-06-21 NOTE — Patient Instructions (Signed)
It is always nice to see you.  I am so sorry you have had such a difficult time with your aggressive chemotherapy.  1) We discussed how to think about taking medications.  Other than the plavix and the Protonix take a medication only if you are going to feel an immediate and meaningful benefit from it.  2) I changed your pain regimen to oxycodone 10-325 mg 1 tablet every 4 hours as needed for pain since this was more effective for you than the hydrocodone.  I gave you 3 prescriptions for this medications with #180 tablets/month that will get you almost completely through August.  3) If you change your mind about the Megace to stimulate your appetite let me know via the phone.  I would be happy to provide you with this.  4) I have written a prescription for compazine rectal suppositories should your nausea and vomiting return and you be unable to keep down even the nausea medication.  It is 1 suppository per rectum every 12 hours as needed.  5)  Feel free to use the sorbitol 70% on a daily basis as needed for your constipation.  Remember, the percocet is going to make the constipation worse, so do not be afraid to use it as necessary.  6) I wrote a letter in support of your dog being with you as much as possible given it's importance in supporting you through your treatment.  7) Congrats on your daughter's graduation from [employer] in the coming week.  I am very happy for both you and Mickel Baas.  I will see you back in 3 months, sooner if necessary.

## 2016-06-21 NOTE — Assessment & Plan Note (Signed)
Assessment  He has recently had significant difficulty associated with the chemotherapy regimen in terms of nausea, vomiting, and resultant weight loss with severe malnutrition. This seems to have improved over the last 2 days. When he was having significant nausea and vomiting he did not find the ondansetron, Compazine, or Reglan effective because he was unable to keep these oral medications down as well.  Plan  He was given a prescription for Compazine 25 mg suppository every 12 hours as needed for severe nausea and vomiting that could not be controlled with oral medications. We will reassess if he required this therapy, and if so its effectiveness, at the follow-up visit.

## 2016-06-21 NOTE — Progress Notes (Signed)
   Subjective:    Patient ID: Brett [name], male    DOB: 07-27-1960, 56 y.o.   MRN: [id number]  HPI  Brett [name] is here for follow-up of what is felt to be metastatic lung adenocarcinoma that is progressing despite standard chemotherapy, complications from such therapy, chronic pain associate with a previous closed head injury, and severe protein calorie malnutrition. Please see the A&P for the status of the pt's chronic medical problems.  Review of Systems  Constitutional: Positive for activity change, appetite change, fatigue and unexpected weight change. Negative for fever.       He remains exceptionally fatigued since initiating chemotherapy.  His appetite is reduced and this has resulted in continued weight loss (he is currently 112 pounds).  Respiratory: Negative for chest tightness and wheezing.   Cardiovascular: Positive for leg swelling.  Gastrointestinal: Positive for abdominal pain, constipation and nausea.       Vomiting improved 2 days ago.  Musculoskeletal: Positive for arthralgias and back pain. Negative for joint swelling.  Neurological: Positive for weakness.       Generalized weakness      Objective:   Physical Exam  Constitutional: He is oriented to person, place, and time. He appears well-developed. No distress.  HENT:  Head: Normocephalic and atraumatic.  Eyes: Conjunctivae are normal. Right eye exhibits no discharge. Left eye exhibits no discharge. No scleral icterus.  Musculoskeletal: Normal range of motion. He exhibits edema. He exhibits no tenderness or deformity.  Neurological: He is alert and oriented to person, place, and time. He exhibits normal muscle tone.  Skin: Skin is warm and dry. No rash noted. He is not diaphoretic. No erythema.  Psychiatric: He has a normal mood and affect. His behavior is normal. Judgment and thought content normal.  Nursing note and vitals reviewed.     Assessment & Plan:   Please see problem oriented charting.

## 2016-06-21 NOTE — Assessment & Plan Note (Signed)
Assessment  With the cancer chemotherapy and nausea and vomiting with significant weight loss he has decided not to take his diabetic medications for fear of hypoglycemia. I supported this decision and we discussed how, at this point, I was not concerned about trying to prevent long-term diabetic complications. We decided, at least currently well receiving therapy, that with the decreased appetite it would not be prudent to take the oral hypoglycemics. I therefore did not check a hemoglobin A1c today. With regards to his associated peripheral vascular occlusive disease he has been tolerating the Plavix well thus far and this will be continued.  Plan  We have relaxed the antihyperglycemic regimen at this point while he undergoes therapy for his metastatic lung cancer. Were his appetite and weight to improve at some point we would reassess the control of his diabetes and consider reinstitution of the oral hypoglycemics if he were felt to potentially benefit from such therapy.

## 2016-06-21 NOTE — Assessment & Plan Note (Signed)
Assessment  His chronic pain syndrome is stable, but now there is additional pain and discomfort associated with his metastatic lung cancer. He found the oxycodone to be more effective in controlling his pain then the hydrocodone.  Plan  We discussed options for pain management and decided to go with the more effective oxycodone. He was therefore started on Percocet 10-325 mg 1 tablet every 4 hours as needed for pain. He was given a prescription for 180 tablets per month and also provided the subsequent 2 months of prescriptions for a total of 3 written prescriptions. By providing him with these written prescriptions it would spare him the arduous trip to the clinic while he is feeling so poorly from his aggressive therapy. We will reassess his pain control at the follow-up visit.

## 2016-06-21 NOTE — Assessment & Plan Note (Signed)
Assessment  Brett [name] has obtained a new dog with whom he is established a close relationship 2. He feels having this dog at his side has provided much needed emotional support as he goes through the trials and tribulation of cancer therapy. He asked that I provide him with a letter stating the importance of the dog to his emotional well-being and health through this current ordeal.  Plan  I wrote a letter for Brett [name] asking that necessary accommodations be made to allow him to be with his dog given its importance to his emotional well-being.  The following letter was provided to him:  June 21, 2016  RE: Brett [name]  To Whom it May Concern,  Brett [name] is a longstanding patient of mine currently undergoing aggressive therapy for a life altering medical condition.  Such aggressive therapy has a major impact upon everyone's wellbeing and increases the need for intense support from loved ones.  Brett [name]' family has been wonderful in providing him with this support, but he also finds significant solace and support from his dog.  Given the importance of his dog to his emotional wellbeing, I am asking that you make all reasonable accommodations for Brett [name] and his dog.  Thank You,   [name], MD, [place], [facility] Program Director and Chief [facility] Internal Medicine Residency Clinical Associate Professor of [place] [facility]

## 2016-06-21 NOTE — Assessment & Plan Note (Signed)
Assessment  He is developed severe protein calorie malnutrition with a markedly reduced appetite and significant weight loss most of which appears to be muscle on examination. This is related to his metastatic lung cancer to the jejunum and the associated anti-neoplastic therapy has been receiving. Fortunately, over the last 2 days he states his appetite is improving.  Plan  He was offered Megace in order to stimulate his appetite but deferred it hoping to eat more now that he was feeling better with regards to his nausea and vomiting. He will call the clinic if he changes his mind and would like to try the appetite stimulant.

## 2016-06-21 NOTE — Assessment & Plan Note (Signed)
Assessment  He has had radiographic progression of his abdominal lymphadenopathy despite standard regimen chemotherapy. He therefore opted for palliative immunotherapy with Keytruda. He started this in May and had 10-14 days of significant nausea and vomiting thereafter. Only during the last 2 days has the nausea and vomiting improved and his appetite picked up. He has had chronic abdominal pain and this appears to have worsened with the progression of his disease. He continues to pass gas in the setting of significant constipation. He has found that the combination of oxycodone with hydrocodone works best for his pain. In particular, when the pain is severe the oxycodone seems to be more effective. He will use the hydrocodone in between doses of oxycodone to keep the pain at [place]. He continues to follow closely with Dr. Earlie Server in the cancer center.  Plan  At this point he is continuing with the [facility] immunotherapy for his lung cancer. We are hopeful that with the decreased nausea and vomiting and the improved appetite he will again begin eating well and regaining some of the weight. For his pain we decided to change his regimen to the oxycodone which is more effective for his symptoms. He will therefore take Percocet 10-325 mg 1 tablet every 4 hours as needed for pain dispense #180 per month. He was given the next 3 months of prescriptions so that he did not have to make the journey back to the clinic to pick them up on a monthly basis. With regards to his appetite we discussed Megace as a possibility, but also reviewed the risks. At this point, he wanted to see if his appetite would return spontaneously and deferred the Megace. I asked him to call the clinic if he were to change his mind as I would be happy to prescribe this therapy over the phone were his appetite not to improve to the point that he desired. Finally, for his constipation, which is likely related to his narcotics more so than his tumor,  he was encouraged to take the sorbitol on a daily basis to better regulate his bowel habits. He states the sorbitol was effective in the past. He will continue to follow with Dr. Earlie Server in the cancer center.

## 2016-06-27 ENCOUNTER — Other Ambulatory Visit (HOSPITAL_BASED_OUTPATIENT_CLINIC_OR_DEPARTMENT_OTHER): Payer: Medicare Other

## 2016-06-27 ENCOUNTER — Telehealth: Payer: Self-pay | Admitting: Internal Medicine

## 2016-06-27 ENCOUNTER — Encounter: Payer: Self-pay | Admitting: Internal Medicine

## 2016-06-27 ENCOUNTER — Ambulatory Visit (HOSPITAL_BASED_OUTPATIENT_CLINIC_OR_DEPARTMENT_OTHER): Payer: Medicare Other

## 2016-06-27 ENCOUNTER — Ambulatory Visit (HOSPITAL_BASED_OUTPATIENT_CLINIC_OR_DEPARTMENT_OTHER): Payer: Medicare Other | Admitting: Internal Medicine

## 2016-06-27 VITALS — BP 100/76 | HR 91 | Temp 97.7°F | Resp 18 | Ht 67.0 in | Wt 110.6 lb

## 2016-06-27 DIAGNOSIS — Z79899 Other long term (current) drug therapy: Secondary | ICD-10-CM

## 2016-06-27 DIAGNOSIS — C3491 Malignant neoplasm of unspecified part of right bronchus or lung: Secondary | ICD-10-CM

## 2016-06-27 DIAGNOSIS — C784 Secondary malignant neoplasm of small intestine: Secondary | ICD-10-CM | POA: Diagnosis not present

## 2016-06-27 DIAGNOSIS — R5382 Chronic fatigue, unspecified: Secondary | ICD-10-CM

## 2016-06-27 DIAGNOSIS — R634 Abnormal weight loss: Secondary | ICD-10-CM

## 2016-06-27 DIAGNOSIS — C3411 Malignant neoplasm of upper lobe, right bronchus or lung: Secondary | ICD-10-CM | POA: Diagnosis not present

## 2016-06-27 DIAGNOSIS — R630 Anorexia: Secondary | ICD-10-CM | POA: Diagnosis not present

## 2016-06-27 DIAGNOSIS — Z5112 Encounter for antineoplastic immunotherapy: Secondary | ICD-10-CM

## 2016-06-27 DIAGNOSIS — K5900 Constipation, unspecified: Secondary | ICD-10-CM

## 2016-06-27 LAB — UA PROTEIN, DIPSTICK - CHCC: Protein, ur: 30 mg/dL

## 2016-06-27 LAB — COMPREHENSIVE METABOLIC PANEL
ALT: 40 U/L (ref 0–55)
AST: 56 U/L — ABNORMAL HIGH (ref 5–34)
Albumin: 2.8 g/dL — ABNORMAL LOW (ref 3.5–5.0)
Alkaline Phosphatase: 75 U/L (ref 40–150)
Anion Gap: 9 mEq/L (ref 3–11)
BUN: 11.3 mg/dL (ref 7.0–26.0)
CO2: 29 mEq/L (ref 22–29)
Calcium: 8.9 mg/dL (ref 8.4–10.4)
Chloride: 99 mEq/L (ref 98–109)
Creatinine: 0.7 mg/dL (ref 0.7–1.3)
EGFR: >90 mL/min/{1.73_m2} (ref >90)
Glucose: 182 mg/dl — ABNORMAL HIGH (ref 70–140)
Potassium: 3.9 mEq/L (ref 3.5–5.1)
Sodium: 136 mEq/L (ref 136–145)
Total Bilirubin: 0.38 mg/dL (ref 0.20–1.20)
Total Protein: 6.2 g/dL — ABNORMAL LOW (ref 6.4–8.3)

## 2016-06-27 LAB — CBC WITH DIFFERENTIAL/PLATELET
BASO%: 1.1 % (ref 0.0–2.0)
Basophils Absolute: 0.1 10*3/uL (ref 0.0–0.1)
EOS%: 1.7 % (ref 0.0–7.0)
Eosinophils Absolute: 0.1 10*3/uL (ref 0.0–0.5)
HCT: 39 % (ref 38.4–49.9)
HGB: 12.7 g/dL — ABNORMAL LOW (ref 13.0–17.1)
LYMPH%: 12.2 % — ABNORMAL LOW (ref 14.0–49.0)
MCH: 26.5 pg — ABNORMAL LOW (ref 27.2–33.4)
MCHC: 32.6 g/dL (ref 32.0–36.0)
MCV: 81.1 fL (ref 79.3–98.0)
MONO#: 0.6 10*3/uL (ref 0.1–0.9)
MONO%: 10.7 % (ref 0.0–14.0)
NEUT#: 4.4 10*3/uL (ref 1.5–6.5)
NEUT%: 74.3 % (ref 39.0–75.0)
Platelets: 359 10*3/uL (ref 140–400)
RBC: 4.81 10*6/uL (ref 4.20–5.82)
RDW: 20.5 % — ABNORMAL HIGH (ref 11.0–14.6)
WBC: 5.9 10*3/uL (ref 4.0–10.3)
lymph#: 0.7 10*3/uL — ABNORMAL LOW (ref 0.9–3.3)

## 2016-06-27 LAB — TSH: TSH: 3.266 m(IU)/L (ref 0.320–4.118)

## 2016-06-27 MED ORDER — SODIUM CHLORIDE 0.9 % IV SOLN
Freq: Once | INTRAVENOUS | Status: AC
  Administered 2016-06-27: 12:00:00 via INTRAVENOUS

## 2016-06-27 MED ORDER — SODIUM CHLORIDE 0.9 % IV SOLN
200.0000 mg | Freq: Once | INTRAVENOUS | Status: AC
  Administered 2016-06-27: 200 mg via INTRAVENOUS
  Filled 2016-06-27: qty 8

## 2016-06-27 MED ORDER — HEPARIN SOD (PORK) LOCK FLUSH 100 UNIT/ML IV SOLN
500.0000 [IU] | Freq: Once | INTRAVENOUS | Status: AC | PRN
  Administered 2016-06-27: 500 [IU]
  Filled 2016-06-27: qty 5

## 2016-06-27 MED ORDER — SODIUM CHLORIDE 0.9% FLUSH
10.0000 mL | INTRAVENOUS | Status: DC | PRN
Start: 2016-06-27 — End: 2016-06-27
  Administered 2016-06-27: 10 mL
  Filled 2016-06-27: qty 10

## 2016-06-27 NOTE — Patient Instructions (Signed)
[facility] Discharge Instructions for Patients Receiving Chemotherapy  Today you received the following chemotherapy agents: Keytruda   To help prevent nausea and vomiting after your treatment, we encourage you to take your nausea medication as directed    If you develop nausea and vomiting that is not controlled by your nausea medication, call the clinic.   BELOW ARE SYMPTOMS THAT SHOULD BE REPORTED IMMEDIATELY:  *FEVER GREATER THAN 100.5 F  *CHILLS WITH OR WITHOUT FEVER  NAUSEA AND VOMITING THAT IS NOT CONTROLLED WITH YOUR NAUSEA MEDICATION  *UNUSUAL SHORTNESS OF BREATH  *UNUSUAL BRUISING OR BLEEDING  TENDERNESS IN MOUTH AND THROAT WITH OR WITHOUT PRESENCE OF ULCERS  *URINARY PROBLEMS  *BOWEL PROBLEMS  UNUSUAL RASH Items with * indicate a potential emergency and should be followed up as soon as possible.  Feel free to call the clinic you have any questions or concerns. The clinic phone number is [phone].  Please show the CHEMO ALERT CARD at check-in to the Emergency Department and triage nurse.   

## 2016-06-27 NOTE — Progress Notes (Signed)
[facility] Telephone:([phone]) [phone]   Fax:(336) [phone]  OFFICE PROGRESS NOTE  [name], MD [address] [place] [address]  DIAGNOSIS: Stage IV (T1b, N0, M1b) non-small cell lung cancer, adenocarcinoma presented with right upper lobe lung nodule and recent metastasis to the small intestine. This was initially diagnosed in September 2017.  Genomic Alterations Identified? ERBB2 amplification - equivocal? CDKN2A p16INK4a E88* and p14ARF Y403K SMARCA4 splice site [id number]>IE SPTA1 E2022* TOP2A amplification TP53 A159P Additional Findings? Microsatellite status MS-Stable Tumor Mutation Burden TMB-Intermediate; 18 Muts/Mb Additional Disease-relevant Genes with No Reportable Alterations Identified? EGFR KRAS ALK BRAF MET RET ROS1   PRIOR THERAPY:  1) Status post right VATS with right upper lobectomy and mediastinal lymph node dissection under the care of Dr. Roxan Hockey on 10/18/2015 and the final pathology was consistent with stage IA (T1b, N0, MX). 2) upper endoscopy on 01/05/2016 showed normal esophagus, normal stomach but there was occasional mass around 3.0 CM in length circumferential nonobstructing in the jejunum. The final pathology was consistent with metastatic adenocarcinoma. 3) status post laparoscopic laparotomy and resection of proximal lesion and and distal jejunum/proximal ileum under the care of Dr. Hassell Done 1 02/27/2016. 3)  Systemic chemotherapy with carboplatin for AUC of 5 and Alimta 500 MG/M2 every 3 weeks. First dose 04/04/2016. Status post 2 cycles. Last dose was given 04/21/2016 discontinued secondary to disease progression.   CURRENT THERAPY: Second line immunotherapy with Ketruda 200 mg IV every 2 weeks, first dose 05/30/2016. Status post one cycle.  INTERVAL HISTORY: Brett [name] 56 y.o. male returns to the clinic today for follow-up visit accompanied by his wife. The patient is feeling much better today. He has been  constipating for long time but recently started taking sorbitol and he has a bowel movement and felt much better. He has less nausea and vomiting as well as hiccups after his bowel movement. He denied having any chest pain, shortness of breath, cough or hemoptysis. He denied having any fever or chills. He has no current nausea or vomiting. He tolerated the first cycle of his treatment with [place] fairly well. He is here today for evaluation and discussion of his treatment options.  MEDICAL HISTORY: Past Medical History:  Diagnosis Date  . Anemia 11/28/2015  . Anxiety   . Arthritis    "hands, back" (11/28/2015)  . Barrett's esophagus 07/01/2013   Without dysplasia on biopsy 09/03/2012. Repeat EGD recommended 08/2015  . Carotid artery stenosis 07/01/2013   Requiring right sided stent   . Chronic pain syndrome 07/01/2013  . Closed head injury with brief loss of consciousness ([place]) 07/22/2010   Head trapped in a hydraulic device at work.  Fracture of orbital bones on right and brief loss of consciousness per report.  . 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 There was likely a strong psycho-social overlay in regard to the cognitive deficits in the form of mood disorder with psychotic features and mixed anxiety symptomatology. His primary tested cognitive deficits are in the areas of attention, executi  . Daily headache "since 07/2010"   constantly  . Degenerative joint disease of cervical spine 07/01/2013  . Dehydration 04/11/2016  . Dupuytren's contracture of both hands 04/08/2014  . Encounter for antineoplastic chemotherapy 10/05/2015  . Encounter for antineoplastic immunotherapy 05/24/2016  . Erectile dysfunction associated with type 2 diabetes mellitus ([place]) 07/01/2013  . Fibromyalgia  07/01/2013  . Goals of care, counseling/discussion 03/28/2016  . History of  blood transfusion 11/28/2015   "suppose to get his first today" (11/28/2015)  . Hyperlipidemia LDL goal < 100 07/01/2013  . Intractable hiccups 04/11/2016  . 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 Memory changes    "memory issues" from head injury  . Osteoarthritis of right thumb 10/21/2014  . Peripheral vascular occlusive disease ([place]) 07/01/2013   Requiring 2 arterial stents above the left knee per report  . Pneumonia ~ 2006/2007  . Post traumatic stress disorder 07/01/2013  . Primary lung adenocarcinoma ([place]) dx'd 08/2015   "right lung"  . Severe major depression with psychotic features ([place]) 04/15/2011  . Tobacco abuse 07/01/2013  . Tobacco abuse   . Type 2 diabetes mellitus with vascular disease ([place]) 07/01/2013   Left lower extremity and right carotid stenting    ALLERGIES:  is allergic to gabapentin; lyrica [pregabalin]; celebrex [celecoxib]; and contrast media [iodinated diagnostic agents].  MEDICATIONS:  Current Outpatient Prescriptions  Medication Sig Dispense Refill  . aspirin EC 81 MG tablet Take 81 mg by mouth daily.     Marland Kitchen atorvastatin (LIPITOR) 40 MG tablet Take 1 tablet (40 mg total) by mouth daily. (Patient not taking: Reported on 06/21/2016) 90 tablet 3  . clopidogrel (PLAVIX) 75 MG tablet Take 75 mg by mouth at bedtime.     Marland Kitchen dexamethasone (DECADRON) 4 MG tablet Take 1 tablet (4 mg total) by mouth 2 (two) times daily with a meal. (Patient not taking: Reported on 05/16/2016) 40 tablet 1  . dronabinol (MARINOL) 2.5 MG capsule Take 1 capsule (2.5 mg total) by mouth 2 (two) times daily before a meal. 60 capsule 0  . Ferrous Sulfate (IRON) 325 (65 Fe) MG TABS Take 325 mg by mouth 2 (two) times daily with a meal. (Patient not taking: Reported on 06/21/2016) 094 each 0  . folic acid (FOLVITE) 1 MG tablet Take 1 tablet (1 mg total) by mouth daily. 30 tablet 4  . HYDROcodone-acetaminophen (NORCO) 10-325 MG tablet Take 1 tablet by mouth every 8 (eight) hours as needed for severe pain. 90  tablet 0  . lidocaine-prilocaine (EMLA) cream Apply 1 application topically as needed. (Patient not taking: Reported on 05/16/2016) 30 g 0  . lisinopril (PRINIVIL,ZESTRIL) 5 MG tablet Take 0.5 tablets (2.5 mg total) by mouth daily. (Patient not taking: Reported on 06/21/2016) 90 tablet 3  . metFORMIN (GLUCOPHAGE) 500 MG tablet Take 1 tablet (500 mg total) by mouth 2 (two) times daily with a meal. (Patient not taking: Reported on 06/21/2016) 180 tablet 3  . metoCLOPramide (REGLAN) 10 MG tablet TAKE 1 TABLET BY MOUTH FOUR TIMES DAILY AS NEEDED AS DIRECTED 30 tablet 0  . ondansetron (ZOFRAN) 4 MG tablet Take 1 tablet (4 mg total) by mouth 3 (three) times daily. (Patient not taking: Reported on 06/21/2016) 20 tablet 0  . oxyCODONE-acetaminophen (PERCOCET) 10-325 MG tablet Take 1 tablet by mouth every 4 (four) hours as needed for pain. 180 tablet 0  . pantoprazole (PROTONIX) 40 MG tablet TAKE 1 TABLET(40 MG) BY MOUTH DAILY 90 tablet 0  . prochlorperazine (COMPAZINE) 10 MG tablet Take 1 tablet (10 mg total) by mouth every 6 (six) hours as needed for nausea or vomiting. (Patient not taking: Reported on 06/21/2016) 30 tablet 0  . prochlorperazine (COMPAZINE) 25 MG suppository Place 1 suppository (25 mg total) rectally every 12 (twelve) hours as needed for refractory nausea / vomiting. 12 suppository 11  . sitaGLIPtin (JANUVIA) 100  MG tablet Take 1 tablet (100 mg total) by mouth daily. (Patient not taking: Reported on 06/21/2016) 90 tablet 3  . sorbitol 70 % solution Take 15 cc every 4 hours until bowel movement (Patient not taking: Reported on 06/21/2016) 473 mL 0   No current facility-administered medications for this visit.     SURGICAL HISTORY:  Past Surgical History:  Procedure Laterality Date  . CAROTID STENT Right ?2014  . COLONOSCOPY N/A 11/30/2015   Procedure: COLONOSCOPY;  Surgeon: Teena Irani, MD;  Location: [facility] ENDOSCOPY;  Service: Endoscopy;  Laterality: N/A;  . ESOPHAGOGASTRODUODENOSCOPY N/A 11/30/2015    Procedure: ESOPHAGOGASTRODUODENOSCOPY (EGD);  Surgeon: Teena Irani, MD;  Location[facility] ENDOSCOPY;  Service: Endoscopy;  Laterality: N/A;  . ESOPHAGOGASTRODUODENOSCOPY (EGD) WITH PROPOFOL N/A 01/05/2016   Procedure: ESOPHAGOGASTRODUODENOSCOPY (EGD) WITH PROPOFOL;  Surgeon: Teena Irani, MD;  Location: [facility] ENDOSCOPY;  Service: Endoscopy;  Laterality: N/A;  . FEMORAL ARTERY STENT Left 05/2012; ~ 2015   Archie Endo 06/04/2012; Raechel Chute report  . FRACTURE SURGERY    . GIVENS CAPSULE STUDY N/A 12/22/2015   Procedure: GIVENS CAPSULE STUDY;  Surgeon: Wonda Horner, MD;  Location: [facility] ENDOSCOPY;  Service: Endoscopy;  Laterality: N/A;  . HARDWARE REMOVAL Right 11/15/2011   Removal of deep frontozygomatic orbital hardware/notes 11/15/2011  . HERNIA REPAIR  [id number]   Umbilical  . LAPAROSCOPY N/A 02/27/2016   Procedure: LAPAROSCOPY, LAPAROTOMY  WITH TWO SMALL BOWEL RESECTION;  Surgeon: Johnathan Hausen, MD;  Location: [facility] ORS;  Service: General;  Laterality: N/A;  . ORIF ORBITAL FRACTURE Right 08/15/2010    caught in a hydraulic machine; open reduction internal fixation of orbital rim fracture and open reduction of zygomatic arch fracture  Archie Endo 10/13/2009  . PORTACATH PLACEMENT Left 04/04/2016   Procedure: INSERTION PORT-A-CATH LEFT CHEST;  Surgeon: Melrose Nakayama, MD;  Location: [facility];  Service: Thoracic;  Laterality: Left;  Marland Kitchen VIDEO ASSISTED THORACOSCOPY (VATS)/ LOBECTOMY Right 10/18/2015   Procedure: VIDEO ASSISTED THORACOSCOPY (VATS)/ LOBECTOMY;  Surgeon: Melrose Nakayama, MD;  Location: [place];  Service: Thoracic;  Laterality: Right;  Marland Kitchen VIDEO BRONCHOSCOPY Bilateral 09/21/2015   Procedure: VIDEO BRONCHOSCOPY WITH FLUORO;  Surgeon: Juanito Doom, MD;  Location: [facility] ENDOSCOPY;  Service: Cardiopulmonary;  Laterality: Bilateral;    REVIEW OF SYSTEMS:  A comprehensive review of systems was negative except for: Constitutional: positive for fatigue and weight loss Gastrointestinal: positive for constipation   PHYSICAL  EXAMINATION: General appearance: alert, cooperative, fatigued and no distress Head: Normocephalic, without obvious abnormality, atraumatic Neck: no adenopathy, no JVD, supple, symmetrical, trachea midline and thyroid not enlarged, symmetric, no tenderness/mass/nodules Lymph nodes: Cervical, supraclavicular, and axillary nodes normal. Resp: clear to auscultation bilaterally Back: symmetric, no curvature. ROM normal. No CVA tenderness. Cardio: regular rate and rhythm, S1, S2 normal, no murmur, click, rub or gallop GI: soft, non-tender; bowel sounds normal; no masses,  no organomegaly Extremities: extremities normal, atraumatic, no cyanosis or edema  ECOG PERFORMANCE STATUS: 1 - Symptomatic but completely ambulatory  Blood pressure 100/76, pulse 91, temperature 97.7 F (36.5 C), temperature source Oral, resp. rate 18, height 5' 7"  (1.702 m), weight 110 lb 9.6 oz (50.2 kg), SpO2 100 %.  LABORATORY DATA: Lab Results  Component Value Date   WBC 6.9 06/06/2016   HGB 12.5 (L) 06/06/2016   HCT 38.2 (L) 06/06/2016   MCV 80.9 06/06/2016   PLT 366 06/06/2016      Chemistry      Component Value Date/Time   NA 134 (L) 06/06/2016 1100  K 4.4 06/06/2016 1100   CL 103 04/04/2016 1206   CO2 28 06/06/2016 1100   BUN 13.2 06/06/2016 1100   CREATININE 0.8 06/06/2016 1100      Component Value Date/Time   CALCIUM 9.4 06/06/2016 1100   ALKPHOS 88 06/06/2016 1100   AST 38 (H) 06/06/2016 1100   ALT 22 06/06/2016 1100   BILITOT 0.32 06/06/2016 1100       RADIOGRAPHIC STUDIES: No results found.   ASSESSMENT AND PLAN:  This is a very pleasant 56 years old white male with metastatic non-small cell lung cancer, adenocarcinoma status post right upper lobectomy with lymph node dissection. The patient then had evidence for metastatic disease in the triage and him and he underwent surgical resection of the proximal and distal jejunum as well as the proximal ileum that was consistent with high-grade  neuroendocrine carcinoma. The patient underwent systemic chemotherapy with 2 cycles of carboplatin and Alimta discontinued secondary to intolerance and disease progression. He is currently undergoing treatment with immunotherapy with [place] status post 1 cycle and tolerated the first cycle well. I recommended for him to proceed with cycle #2 today as scheduled. I will see him back for follow-up visit in 3 weeks for evaluation before starting cycle #3. For the lack of appetite and weight loss, he will continue on Marinol 2.5 mg by mouth twice a day. For constipation the patient will continue his treatment with sorbitol. He was advised to call immediately if he has any concerning symptoms in the interval. The patient voices understanding of current disease status and treatment options and is in agreement with the current care plan. All questions were answered. The patient knows to call the clinic with any problems, questions or concerns. We can certainly see the patient much sooner if necessary.  Disclaimer: This note was dictated with voice recognition software. Similar sounding words can inadvertently be transcribed and may not be corrected upon review.

## 2016-06-27 NOTE — Telephone Encounter (Signed)
Gave patient avs report and appointments for June thru August. °

## 2016-07-04 ENCOUNTER — Telehealth: Payer: Self-pay

## 2016-07-04 NOTE — Telephone Encounter (Signed)
Pt's wife states that dr Eppie Gibson saw pt's feet last appt but they are worse, dr Earlie Server saw them last week but as they swell more pt is c/o pain in his feet and ankles, wife is very concerned, appt [facility] 6/15 at 1045, dr klima's schedule was full.

## 2016-07-04 NOTE — Telephone Encounter (Signed)
Would like a call back about husband swollen feet. Please call pt back.

## 2016-07-05 ENCOUNTER — Ambulatory Visit (INDEPENDENT_AMBULATORY_CARE_PROVIDER_SITE_OTHER): Payer: Medicare Other | Admitting: Internal Medicine

## 2016-07-05 ENCOUNTER — Ambulatory Visit: Payer: Self-pay

## 2016-07-05 VITALS — BP 99/67 | HR 76 | Temp 97.8°F | Wt 116.1 lb

## 2016-07-05 DIAGNOSIS — Z85118 Personal history of other malignant neoplasm of bronchus and lung: Secondary | ICD-10-CM

## 2016-07-05 DIAGNOSIS — E1151 Type 2 diabetes mellitus with diabetic peripheral angiopathy without gangrene: Secondary | ICD-10-CM | POA: Diagnosis not present

## 2016-07-05 DIAGNOSIS — R600 Localized edema: Secondary | ICD-10-CM

## 2016-07-05 DIAGNOSIS — R112 Nausea with vomiting, unspecified: Secondary | ICD-10-CM

## 2016-07-05 DIAGNOSIS — K5900 Constipation, unspecified: Secondary | ICD-10-CM

## 2016-07-05 DIAGNOSIS — Z902 Acquired absence of lung [part of]: Secondary | ICD-10-CM | POA: Diagnosis not present

## 2016-07-05 DIAGNOSIS — T451X5A Adverse effect of antineoplastic and immunosuppressive drugs, initial encounter: Secondary | ICD-10-CM

## 2016-07-05 DIAGNOSIS — K5903 Drug induced constipation: Secondary | ICD-10-CM

## 2016-07-05 DIAGNOSIS — C799 Secondary malignant neoplasm of unspecified site: Secondary | ICD-10-CM

## 2016-07-05 DIAGNOSIS — T402X5A Adverse effect of other opioids, initial encounter: Secondary | ICD-10-CM | POA: Insufficient documentation

## 2016-07-05 DIAGNOSIS — G8929 Other chronic pain: Secondary | ICD-10-CM | POA: Diagnosis not present

## 2016-07-05 MED ORDER — SENNOSIDES-DOCUSATE SODIUM 8.6-50 MG PO TABS: 1.0000 | ORAL_TABLET | Freq: Two times a day (BID) | ORAL | 1 refills | Status: DC | PRN

## 2016-07-05 NOTE — Patient Instructions (Addendum)
There are lots of things that could be contributing to the swelling in your legs.  Malnutrition, lymphedema (blockage of the lymph ducts which prevents fluid from draining well), and a side effect of Keytruda are all possibilities.  I think it is much less likely that this is because of your heart.  I will pass a message along to Dr. Julien Nordmann to let him know that the swelling in her legs has gotten worse, and see if he thinks that this could be because of the Keytruda.  I'm glad to feel acutely been able to eat better recently. Keep trying to make sure you have regular bowel movements and control your nausea and vomiting. If you have troubles with these things getting worse, please contact Dr. Eppie Gibson or Dr. Julien Nordmann.  I have prescribed you a new laxative and stool softener pill. I recommend taking at least 1 pill daily, and up to 2 pills twice daily for constipation. Pain medicines you're taking will make you constipated, and this should help.

## 2016-07-05 NOTE — Assessment & Plan Note (Signed)
Over the past 2-3 weeks he has had progressively worsening bilateral lower extremity edema.  This week his legs been very swollen with painful taut skin, and he is also started noticing some fluid seeping from his feet.  No PND or orthopnea.  A/P His lower extremity edema is most likely multifactorial, with possible contributions of malnutrition (last albumin 2.8), lymphatic obstruction (last CT scan in April showing progressing nodal metastases in retroperitoneal lymph nodes), and peripheral edema as adverse effect of Keytruda.  I think right heart failure is less likely etiology with recent normal echocardiogram and no left heart symptoms.  -Elevation and compression -Refer to [facility] for management of edema -Discuss possibility of Keytruda contribution with oncologist Dr. Julien Nordmann

## 2016-07-05 NOTE — Assessment & Plan Note (Signed)
His had severe constipation over the last couple of months. Most recently he had a bowel movement several days ago after taking sorbitol laxatives.  A/P Constipation is likely multifactorial, but suspect major tradition of opioid-induced constipation.   -Docusate-senna 1-2 pills twice a day as needed to maintain bowel movement every 1-2 days -MiraLAX and sorbitol when necessary

## 2016-07-05 NOTE — Assessment & Plan Note (Signed)
Nausea and vomiting have been much improved recently, and he has been able to eat more. He did have some nausea and vomiting when he ate some chili 2 days ago, but he thinks he just stick to more mild foods.  -Continue Reglan, Zofran, Compazine when necessary

## 2016-07-05 NOTE — Progress Notes (Signed)
CC: "My legs have been getting more swollen and painful."  HPI:  Brett [name] is a 56 y.o. man with metastatic lung cancer (s/p RULectomy 09/2015, now on palliative immunotherapy with Keytruda), DM, PVD, and chronic pain who presents for worsening painful leg swelling.  Please see A&P for status of the patient's chronic medical conditions.   Past Medical History:  Diagnosis Date  . Anemia 11/28/2015  . Anxiety   . Arthritis    "hands, back" (11/28/2015)  . Barrett's esophagus 07/01/2013   Without dysplasia on biopsy 09/03/2012. Repeat EGD recommended 08/2015  . Carotid artery stenosis 07/01/2013   Requiring right sided stent   . Chronic pain syndrome 07/01/2013  . Closed head injury with brief loss of consciousness ([place]) 07/22/2010   Head trapped in a hydraulic device at work.  Fracture of orbital bones on right and brief loss of consciousness per report.  . 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 There was likely a strong psycho-social overlay in regard to the cognitive deficits in the form of mood disorder with psychotic features and mixed anxiety symptomatology. His primary tested cognitive deficits are in the areas of attention, executi  . Daily headache "since 07/2010"   constantly  . Degenerative joint disease of cervical spine 07/01/2013  . Dehydration 04/11/2016  . Dupuytren's contracture of both hands 04/08/2014  . Encounter for antineoplastic chemotherapy 10/05/2015  . Encounter for antineoplastic immunotherapy 05/24/2016  . Erectile dysfunction associated with type 2 diabetes mellitus ([place]) 07/01/2013  . Fibromyalgia 07/01/2013  . Goals of care, counseling/discussion 03/28/2016  . History of blood transfusion 11/28/2015   "suppose to get his first today" (11/28/2015)  . Hyperlipidemia LDL goal < 100 07/01/2013  . Intractable hiccups 04/11/2016   . 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 Memory changes    "memory issues" from head injury  . Osteoarthritis of right thumb 10/21/2014  . Peripheral vascular occlusive disease ([place]) 07/01/2013   Requiring 2 arterial stents above the left knee per report  . Pneumonia ~ 2006/2007  . Post traumatic stress disorder 07/01/2013  . Primary lung adenocarcinoma ([place]) dx'd 08/2015   "right lung"  . Severe major depression with psychotic features ([place]) 04/15/2011  . Tobacco abuse 07/01/2013  . Tobacco abuse   . Type 2 diabetes mellitus with vascular disease ([place]) 07/01/2013   Left lower extremity and right carotid stenting    Review of Systems:   Review of Systems  Constitutional: Positive for weight loss. Negative for chills and fever.  Respiratory: Negative for cough and shortness of breath.   Gastrointestinal: Positive for constipation, nausea and vomiting. Negative for diarrhea.  Neurological: Positive for weakness and headaches.   Physical Exam:  Vitals:   07/05/16 1114  BP: 99/67  Pulse: 76  Temp: 97.8 F (36.6 C)  TempSrc: Oral  SpO2: 100%  Weight: 116 lb 1.6 oz (52.7 kg)   Physical Exam  Constitutional: He is oriented to person, place, and time.  Cachectic man in no acute distress  Cardiovascular: Normal rate and regular rhythm.   Pulmonary/Chest: Effort normal and breath sounds normal.  Musculoskeletal:  3+ pitting edema to knees bilaterally  Neurological: He is alert and oriented to person, place, and time.  Skin:  Skin taut and shiny Scant serous seepage from dorsal feet bilaterally  Psychiatric: He has a normal mood and affect. His behavior is normal.  Assessment & Plan:   See Encounters Tab for problem based charting.  Patient discussed with Dr. Lynnae January

## 2016-07-05 NOTE — Progress Notes (Signed)
Internal Medicine Clinic Attending  Case discussed with Dr. O'Sullivan at the time of the visit.  We reviewed the resident's history and exam and pertinent patient test results.  I agree with the assessment, diagnosis, and plan of care documented in the resident's note. 

## 2016-07-09 ENCOUNTER — Ambulatory Visit: Payer: Medicare Other | Attending: Internal Medicine | Admitting: Physical Therapy

## 2016-07-09 DIAGNOSIS — R2681 Unsteadiness on feet: Secondary | ICD-10-CM | POA: Diagnosis not present

## 2016-07-09 DIAGNOSIS — I890 Lymphedema, not elsewhere classified: Secondary | ICD-10-CM | POA: Diagnosis not present

## 2016-07-09 DIAGNOSIS — M79605 Pain in left leg: Secondary | ICD-10-CM | POA: Diagnosis not present

## 2016-07-09 DIAGNOSIS — M79604 Pain in right leg: Secondary | ICD-10-CM | POA: Insufficient documentation

## 2016-07-09 NOTE — Therapy (Signed)
[place], [place], [address] Phone: [phone]   Fax:  [phone]  Physical Therapy Evaluation  Patient Details  Name: Brett [name] MRN: [id number] Date of Birth: Feb 06, 1960 Referring Provider: Dr. Minus Liberty   Encounter Date: 07/09/2016      PT End of Session - 07/09/16 2046    Visit Number 1   Number of Visits 13   Date for PT Re-Evaluation 08/08/16   PT Start Time 1030  pt late for appt    PT Stop Time 1100   PT Time Calculation (min) 30 min   Activity Tolerance Patient tolerated treatment well   Behavior During Therapy [facility] for tasks assessed/performed      Past Medical History:  Diagnosis Date  . Anemia 11/28/2015  . Anxiety   . Arthritis    "hands, back" (11/28/2015)  . Barrett's esophagus 07/01/2013   Without dysplasia on biopsy 09/03/2012. Repeat EGD recommended 08/2015  . Carotid artery stenosis 07/01/2013   Requiring right sided stent   . Chronic pain syndrome 07/01/2013  . Closed head injury with brief loss of consciousness ([place]) 07/22/2010   Head trapped in a hydraulic device at work.  Fracture of orbital bones on right and brief loss of consciousness per report.  . 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 There was likely a strong psycho-social overlay in regard to the cognitive deficits in the form of mood disorder with psychotic features and mixed anxiety symptomatology. His primary tested cognitive deficits are in the areas of attention, executi  . Daily headache "since 07/2010"   constantly  . Degenerative joint disease of cervical spine 07/01/2013  . Dehydration 04/11/2016  . Dupuytren's contracture of both hands 04/08/2014  . Encounter for antineoplastic chemotherapy 10/05/2015  . Encounter for antineoplastic immunotherapy 05/24/2016   . Erectile dysfunction associated with type 2 diabetes mellitus ([place]) 07/01/2013  . Fibromyalgia 07/01/2013  . Goals of care, counseling/discussion 03/28/2016  . History of blood transfusion 11/28/2015   "suppose to get his first today" (11/28/2015)  . Hyperlipidemia LDL goal < 100 07/01/2013  . Intractable hiccups 04/11/2016  . 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 Memory changes    "memory issues" from head injury  . Osteoarthritis of right thumb 10/21/2014  . Peripheral vascular occlusive disease ([place]) 07/01/2013   Requiring 2 arterial stents above the left knee per report  . Pneumonia ~ 2006/2007  . Post traumatic stress disorder 07/01/2013  . Primary lung adenocarcinoma ([place]) dx'd 08/2015   "right lung"  . Severe major depression with psychotic features ([place]) 04/15/2011  . Tobacco abuse 07/01/2013  . Tobacco abuse   . Type 2 diabetes mellitus with vascular disease ([place]) 07/01/2013   Left lower extremity and right carotid stenting    Past Surgical History:  Procedure Laterality Date  . CAROTID STENT Right ?2014  . COLONOSCOPY N/A 11/30/2015   Procedure: COLONOSCOPY;  Surgeon: Teena Irani, MD;  Location: [facility] ENDOSCOPY;  Service: Endoscopy;  Laterality: N/A;  . ESOPHAGOGASTRODUODENOSCOPY N/A 11/30/2015   Procedure: ESOPHAGOGASTRODUODENOSCOPY (EGD);  Surgeon: Teena Irani, MD;  Location: [facility] ENDOSCOPY;  Service: Endoscopy;  Laterality: N/A;  . ESOPHAGOGASTRODUODENOSCOPY (EGD) WITH PROPOFOL N/A 01/05/2016   Procedure: ESOPHAGOGASTRODUODENOSCOPY (EGD) WITH PROPOFOL;  Surgeon: Teena Irani, MD;  Location: [facility] ENDOSCOPY;  Service: Endoscopy;  Laterality: N/A;  . FEMORAL ARTERY STENT Left 05/2012; ~ 2015   Archie Endo  06/04/2012; Raechel Chute report  . FRACTURE SURGERY    . GIVENS CAPSULE STUDY N/A 12/22/2015   Procedure: GIVENS CAPSULE STUDY;  Surgeon: Wonda Horner, MD;  Location: [facility] ENDOSCOPY;  Service: Endoscopy;  Laterality: N/A;  . HARDWARE REMOVAL Right 11/15/2011   Removal of deep frontozygomatic  orbital hardware/notes 11/15/2011  . HERNIA REPAIR  0998   Umbilical  . LAPAROSCOPY N/A 02/27/2016   Procedure: LAPAROSCOPY, LAPAROTOMY  WITH TWO SMALL BOWEL RESECTION;  Surgeon: Johnathan Hausen, MD;  Location: [facility] ORS;  Service: General;  Laterality: N/A;  . ORIF ORBITAL FRACTURE Right 08/15/2010    caught in a hydraulic machine; open reduction internal fixation of orbital rim fracture and open reduction of zygomatic arch fracture  Archie Endo 10/13/2009  . PORTACATH PLACEMENT Left 04/04/2016   Procedure: INSERTION PORT-A-CATH LEFT CHEST;  Surgeon: Melrose Nakayama, MD;  Location: [facility];  Service: Thoracic;  Laterality: Left;  Marland Kitchen VIDEO ASSISTED THORACOSCOPY (VATS)/ LOBECTOMY Right 10/18/2015   Procedure: VIDEO ASSISTED THORACOSCOPY (VATS)/ LOBECTOMY;  Surgeon: Melrose Nakayama, MD;  Location: [facility];  Service: Thoracic;  Laterality: Right;  Marland Kitchen VIDEO BRONCHOSCOPY Bilateral 09/21/2015   Procedure: VIDEO BRONCHOSCOPY WITH FLUORO;  Surgeon: Juanito Doom, MD;  Location: [facility] ENDOSCOPY;  Service: Cardiopulmonary;  Laterality: Bilateral;    There were no vitals filed for this visit.       Subjective Assessment - 07/09/16 1054    Subjective pt comes in with complaints of pain and swelling in lower etremities for about 2 weeks.  He comes in with his wife who is very involved in his care    Patient is accompained by: Family member  wife    Pertinent History metastatice lung cancer.  S/P VATS 10/18/2015, S/P laparoscopic laparotomy  for intestinal mets in which he had retroperitoneal lymph nodes removed, malnutition with decreased blood albumin, and swelling from possible side effect from [place]   past histroy includes DM and arterial bypass in left leg    Patient Stated Goals to get rid of the pain and swelling in his legs    Pain Score 10-Worst pain ever   Pain Location Leg   Pain Orientation Left   Pain Descriptors / Indicators Aching;Constant  better with elevation, worse with walking    Pain  Type Acute pain   Pain Onset More than a month ago   Pain Frequency Constant            [facility] PT Assessment - 07/09/16 0001      Assessment   Medical Diagnosis metastatic lung cancer    Referring Provider Dr. Minus Liberty    Onset Date/Surgical Date 09/22/15     Precautions   Precautions Other (comment)   Precaution Comments falls, fragile skin, multiple medical problems      Balance Screen   Has the patient fallen in the past 6 months --  did not have time to assess this  as pt came late      [place] residence   Living Arrangements Spouse/significant other   Available Help at Discharge Available 24 hours/day     Prior Function   Level of Independence Needs assistance with ADLs  wife assists as needed    Vocation Unemployed   Leisure used to like to work in his yard, is not able now      Cognition   Overall Cognitive Status History of cognitive impairments - at baseline  past head injury.  needs cues to stay  on task      Observation/Other Assessments   Observations pt comes in walking with a cane.  He has shoes he has slipped into by stepping down on the backs.  He wears no socks.  His  feet appear pink and shiny with pitting edema  in both feet and legs.  He has red abarasions in medial aspect of second toe and on top of foot at first metatsal on the left  He has no hair on either lower leg from about 10 cm below patella.   Upon elevation of legs, color of feet improves and toes become pale.  His feet are warm    Skin Integrity appears fragile, especially in left foot at toes and forefoot     Observation/Other Assessments-Edema    Edema --  yes in both feet and lower legs      Sensation   Light Touch Impaired by gross assessment     Posture/Postural Control   Posture/Postural Control Postural limitations   Postural Limitations Rounded Shoulders;Forward head           LYMPHEDEMA/ONCOLOGY QUESTIONNAIRE - 07/09/16  1038      Type   Cancer Type metastatic lung cancer      Treatment   Active Chemotherapy Treatment Yes     What other symptoms do you have   Are you Having Heaviness or Tightness Yes   Are you having Pain Yes   Are you having pitting edema Yes   Body Site both feet and ankles   Is it Hard or Difficult finding clothes that fit Yes   [place] Sign Yes     Right Lower Extremity Lymphedema   At Midpatella/Popliteal Crease 35.5 cm   30 cm Proximal to Floor at Lateral Plantar Foot 35 cm   20 cm Proximal to Floor at Lateral Plantar Foot 28 1   10  cm Proximal to Floor at Lateral Malleoli 26.5 cm   5 cm Proximal to 1st MTP Joint 24.3 cm   Across MTP Joint 23.5 cm   Around Proximal Great Toe 8.4 cm     Left Lower Extremity Lymphedema   At Midpatella/Popliteal Crease 34.5 cm   30 cm Proximal to Floor at Lateral Plantar Foot 33 cm   20 cm Proximal to Floor at Lateral Plantar Foot 27.4 cm   10 cm Proximal to Floor at Lateral Malleoli 27 cm   5 cm Proximal to 1st MTP Joint 25 cm   Around Proximal Great Toe 8.5 cm         Objective measurements completed on examination: See above findings.          [facility] Adult PT Treatment/Exercise - 07/09/16 0001      Self-Care   Self-Care Other Self-Care Comments   Other Self-Care Comments  provided medium tg soft to both legs from toes to below knee      Exercises   Exercises Ankle  pumps and circles with elevation                 PT Education - 07/09/16 1238    Education provided Yes   Education Details elevate legs for at least 30 minutes 3 times a day with legs higher than heart. Do ankle pumps while legs are elevated.  Wear TG soft for comfort only, do not allow them to bunch up or dig in to legs    Person(s) Educated Patient;Spouse   Methods Explanation   Comprehension Verbalized understanding  [facility] Goals - 07/09/16 2056      CC Long Term Goal  #1   Title Pt will report the pain in  both legs has decreased by 50 % overall    Time 4   Period Weeks   Status New     CC Long Term Goal  #2   Title circumference of right lower extremity at 10 cm proximal to the floor at lateral foot will decrease by 2 cm (to 24.5 cm)    Baseline 26.5cm on 07/09/2016   Time 4   Period Weeks   Status New     CC Long Term Goal  #3   Title circumference of left lower extremity at 10 cm proximal to the floor at lateral foot will decrease by 2 cm (to 25cm)    Baseline 27 cm on 07/09/2016   Time 4   Period Weeks   Status New     CC Long Term Goal  #4   Title Pt will be independent in conservative measures of elevation and exercise to help control LE lymphedema    Time 4   Period Weeks   Status New     CC Long Term Goal  #5   Title Pt will report he is knowledgable in use of compression to help conrol LE swelling    Time 4   Period Weeks   Status New             Plan - 07/09/16 2046    Clinical Impression Statement Pt with multiple medical issues presents with lymphedema.  He has fragile skin which appears at risk for breakdown and he possibly has an arterial and venous aspect to his leg pain and edema in addition to lymph node impairment and metabolic issues.  Becuase of this, compression will need to be gentle and he may not tolerate aggressive compression bandaging. Reinforces conservative measures of elevation and exercise and applied medium tg soft for gentle compression with instruction to remove if uncomfortable.  Wife will check to see if pt has Medicaid as Medicare will not pay for compression garments.  He may be a good candidate for a velcro compression garment that can be removed for frequent skin checks.    History and Personal Factors relevant to plan of care: metastatic cancer with lymph node involvement, history of DM and vascular stents in left leg, at risk for skin breakdown    Clinical Presentation Evolving   Clinical Presentation due to: unmanageable lower extremity  edema and leg pain    Clinical Decision Making Moderate   Rehab Potential Fair   Clinical Impairments Affecting Rehab Potential fragile skin and leg pain that may not tolerate aggressive bandaging.   PT Frequency 3x / week   PT Duration 4 weeks   PT Treatment/Interventions ADLs/Self Care Home Management;Patient/family education;Therapeutic activities;Therapeutic exercise;Manual techniques;Manual lymph drainage;Compression bandaging   PT Next Visit Plan Assess effect of TG soft. Review elevation and exercise compliance, manual lymph draiange, assess if pt can tolerate compression bandageing vs. circaid with foot piece attached if Medicaid is active    Consulted and Agree with Plan of Care Patient;Family member/caregiver   Family Member Consulted wife      Patient will benefit from skilled therapeutic intervention in order to improve the following deficits and impairments:  Decreased skin integrity, Increased edema, Decreased knowledge of precautions, Decreased knowledge of use of DME, Pain, Decreased activity tolerance, Postural dysfunction  Visit Diagnosis: Lymphedema, not elsewhere classified  Pain  in left leg  Pain in right leg      G-Codes - 07-24-16 Apr 15, 2099    Functional Assessment Tool Used (Outpatient Only) clincial judgement    Functional Limitation Self care   Self Care Current Status (Z6109) At least 40 percent but less than 60 percent impaired, limited or restricted   Self Care Goal Status (U0454) At least 20 percent but less than 40 percent impaired, limited or restricted       Problem List Patient Active Problem List   Diagnosis Date Noted  . Bilateral edema of lower extremity 07/05/2016  . Constipation 07/05/2016  . Chemotherapy-induced nausea and vomiting 06/21/2016  . Encounter for antineoplastic immunotherapy 05/24/2016  . Intractable hiccups 04/11/2016  . Goals of care, counseling/discussion 03/28/2016  . 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  Protein-calorie malnutrition, severe 11/29/2015  . Encounter for antineoplastic chemotherapy 10/05/2015  . Primary lung adenocarcinoma ([place]) 08/25/2015  . Osteoarthritis of right thumb 10/21/2014  . Dupuytren's contracture of both hands 04/08/2014  . Healthcare maintenance 10/08/2013  . Post traumatic stress disorder 07/02/2013  . Type 2 diabetes mellitus with vascular disease ([place]) 07/01/2013  . Peripheral vascular occlusive disease ([place]) 07/01/2013  . 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 Erectile dysfunction associated with type 2 diabetes mellitus ([place]) 07/01/2013  . Hyperlipidemia 07/01/2013  . Fibromyalgia 07/01/2013  . Degenerative joint disease of cervical spine 07/01/2013  . Chronic pain syndrome 07/01/2013  . Cognitive disorder 04/15/2011  . Closed head injury with brief loss of consciousness ([place]) 08/10/2010   Donato Heinz. Owens Shark PT  Norwood Levo 24-Jul-2016, 9:06 PM  [place], [place], [address] Phone: [phone]   Fax:  [phone]  Name: Brett [name] MRN: [id number] Date of Birth: [birth year]

## 2016-07-11 ENCOUNTER — Ambulatory Visit: Payer: Medicare Other

## 2016-07-11 DIAGNOSIS — I890 Lymphedema, not elsewhere classified: Secondary | ICD-10-CM

## 2016-07-11 DIAGNOSIS — M79604 Pain in right leg: Secondary | ICD-10-CM

## 2016-07-11 DIAGNOSIS — R2681 Unsteadiness on feet: Secondary | ICD-10-CM

## 2016-07-11 DIAGNOSIS — M79605 Pain in left leg: Secondary | ICD-10-CM

## 2016-07-11 NOTE — Therapy (Signed)
[place], [place], [address] Phone: [phone]   Fax:  [phone]  Physical Therapy Treatment  Patient Details  Name: Brett [name] MRN: [id number] Date of Birth: [birth year] Referring Provider: Dr. Minus Liberty   Encounter Date: 07/11/2016      PT End of Session - 07/11/16 1205    Visit Number 2   Number of Visits 13   Date for PT Re-Evaluation 08/08/16   PT Start Time 1100   PT Stop Time 1200   PT Time Calculation (min) 60 min   Activity Tolerance Patient tolerated treatment well   Behavior During Therapy [facility] for tasks assessed/performed      Past Medical History:  Diagnosis Date  . Anemia 11/28/2015  . Anxiety   . Arthritis    "hands, back" (11/28/2015)  . Barrett's esophagus 07/01/2013   Without dysplasia on biopsy 09/03/2012. Repeat EGD recommended 08/2015  . Carotid artery stenosis 07/01/2013   Requiring right sided stent   . Chronic pain syndrome 07/01/2013  . Closed head injury with brief loss of consciousness ([place]) 07/22/2010   Head trapped in a hydraulic device at work.  Fracture of orbital bones on right and brief loss of consciousness per report.  . 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 There was likely a strong psycho-social overlay in regard to the cognitive deficits in the form of mood disorder with psychotic features and mixed anxiety symptomatology. His primary tested cognitive deficits are in the areas of attention, executi  . Daily headache "since 07/2010"   constantly  . Degenerative joint disease of cervical spine 07/01/2013  . Dehydration 04/11/2016  . Dupuytren's contracture of both hands 04/08/2014  . Encounter for antineoplastic chemotherapy 10/05/2015  . Encounter for antineoplastic immunotherapy 05/24/2016  . Erectile  dysfunction associated with type 2 diabetes mellitus ([place]) 07/01/2013  . Fibromyalgia 07/01/2013  . Goals of care, counseling/discussion 03/28/2016  . History of blood transfusion 11/28/2015   "suppose to get his first today" (11/28/2015)  . Hyperlipidemia LDL goal < 100 07/01/2013  . Intractable hiccups 04/11/2016  . 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 Memory changes    "memory issues" from head injury  . Osteoarthritis of right thumb 10/21/2014  . Peripheral vascular occlusive disease ([place]) 07/01/2013   Requiring 2 arterial stents above the left knee per report  . Pneumonia ~ 2006/2007  . Post traumatic stress disorder 07/01/2013  . Primary lung adenocarcinoma ([place]) dx'd 08/2015   "right lung"  . Severe major depression with psychotic features ([place]) 04/15/2011  . Tobacco abuse 07/01/2013  . Tobacco abuse   . Type 2 diabetes mellitus with vascular disease ([place]) 07/01/2013   Left lower extremity and right carotid stenting    Past Surgical History:  Procedure Laterality Date  . CAROTID STENT Right ?2014  . COLONOSCOPY N/A 11/30/2015   Procedure: COLONOSCOPY;  Surgeon: Teena Irani, MD;  Location[facility] ENDOSCOPY;  Service: Endoscopy;  Laterality: N/A;  . ESOPHAGOGASTRODUODENOSCOPY N/A 11/30/2015   Procedure: ESOPHAGOGASTRODUODENOSCOPY (EGD);  Surgeon: Teena Irani, MD;  Location: [facility] ENDOSCOPY;  Service: Endoscopy;  Laterality: N/A;  . ESOPHAGOGASTRODUODENOSCOPY (EGD) WITH PROPOFOL N/A 01/05/2016   Procedure: ESOPHAGOGASTRODUODENOSCOPY (EGD) WITH PROPOFOL;  Surgeon: Teena Irani, MD;  Location: [facility] ENDOSCOPY;  Service: Endoscopy;  Laterality: N/A;  . FEMORAL ARTERY STENT Left 05/2012; ~ 2015   Archie Endo 06/04/2012; Raechel Chute report  . FRACTURE  SURGERY    . GIVENS CAPSULE STUDY N/A 12/22/2015   Procedure: GIVENS CAPSULE STUDY;  Surgeon: Wonda Horner, MD;  Location: [facility] ENDOSCOPY;  Service: Endoscopy;  Laterality: N/A;  . HARDWARE REMOVAL Right 11/15/2011   Removal of deep frontozygomatic orbital  hardware/notes 11/15/2011  . HERNIA REPAIR  1443   Umbilical  . LAPAROSCOPY N/A 02/27/2016   Procedure: LAPAROSCOPY, LAPAROTOMY  WITH TWO SMALL BOWEL RESECTION;  Surgeon: Johnathan Hausen, MD;  Location: [facility] ORS;  Service: General;  Laterality: N/A;  . ORIF ORBITAL FRACTURE Right 08/15/2010    caught in a hydraulic machine; open reduction internal fixation of orbital rim fracture and open reduction of zygomatic arch fracture  Archie Endo 10/13/2009  . PORTACATH PLACEMENT Left 04/04/2016   Procedure: INSERTION PORT-A-CATH LEFT CHEST;  Surgeon: Melrose Nakayama, MD;  Location: [place];  Service: Thoracic;  Laterality: Left;  Marland Kitchen VIDEO ASSISTED THORACOSCOPY (VATS)/ LOBECTOMY Right 10/18/2015   Procedure: VIDEO ASSISTED THORACOSCOPY (VATS)/ LOBECTOMY;  Surgeon: Melrose Nakayama, MD;  Location: [place];  Service: Thoracic;  Laterality: Right;  Marland Kitchen VIDEO BRONCHOSCOPY Bilateral 09/21/2015   Procedure: VIDEO BRONCHOSCOPY WITH FLUORO;  Surgeon: Juanito Doom, MD;  Location: [facility] ENDOSCOPY;  Service: Cardiopulmonary;  Laterality: Bilateral;    There were no vitals filed for this visit.      Subjective Assessment - 07/11/16 1112    Subjective My swelling went down well Wednesday morning and a little better this morning but they increased within a few minutes of standing. Pain was unchanged when swelling was less.    Patient is accompained by: Family member  wife   Pertinent History metastatice lung cancer.  S/P VATS 10/18/2015, S/P laparoscopic laparotomy  for intestinal mets in which he had retroperitoneal lymph nodes removed, malnutition with decreased blood albumin, and swelling from possible side effect from [place]   past histroy includes DM and arterial bypass in left leg    Patient Stated Goals to get rid of the pain and swelling in his legs    Currently in Pain? Yes   Pain Score 8    Pain Location Leg   Pain Orientation Left   Pain Descriptors / Indicators Aching;Constant   Pain Type Acute pain    Pain Onset More than a month ago   Pain Frequency Constant   Aggravating Factors  walking   Pain Relieving Factors better with elevation                         [facility] Adult PT Treatment/Exercise - 07/11/16 0001      Manual Therapy   Manual Therapy Manual Lymphatic Drainage (MLD);Edema management   Edema Management Issued TubiGrip for bil LE's to from foot to knees.   Manual Lymphatic Drainage (MLD) In Supine: Short neck, 5 diaphragmatic breaths, Rt axillary nodes, Rt inguino-axillary anastomosis, and Rt LE from dorsal foot to lateral thigh working from proximal to distal then retracing all steps; then same to Lt instructing wife throughout.                PT Education - 07/11/16 1204    Education provided Yes   Education Details Manual lymph drainage   Person(s) Educated Patient;Spouse   Methods Explanation;Demonstration;Handout   Comprehension Verbalized understanding;Returned demonstration;Need further instruction                [facility] Goals - 07/09/16 2056      CC Long Term Goal  #1  Title Pt will report the pain in both legs has decreased by 50 % overall    Time 4   Period Weeks   Status New     CC Long Term Goal  #2   Title circumference of right lower extremity at 10 cm proximal to the floor at lateral foot will decrease by 2 cm (to 24.5 cm)    Baseline 26.5cm on 07/09/2016   Time 4   Period Weeks   Status New     CC Long Term Goal  #3   Title circumference of left lower extremity at 10 cm proximal to the floor at lateral foot will decrease by 2 cm (to 25cm)    Baseline 27 cm on 07/09/2016   Time 4   Period Weeks   Status New     CC Long Term Goal  #4   Title Pt will be independent in conservative measures of elevation and exercise to help control LE lymphedema    Time 4   Period Weeks   Status New     CC Long Term Goal  #5   Title Pt will report he is knowledgable in use of compression to help conrol LE swelling     Time 4   Period Weeks   Status New            Plan - 07/11/16 1205    Clinical Impression Statement PT tolerated first session of manual lymph drainage well with no increase pain during. Issued TubiGrip for pt to try as this has a little more compression than the thick stockinette but instructed pt and wife to take off if he has any increased pain. Pt reported them feeling good when he left. Pts redness was mostly only at dorsal Lt foot today, legs were not red.    Clinical Impairments Affecting Rehab Potential fragile skin and leg pain that may not tolerate aggressive bandaging.   PT Frequency 3x / week   PT Duration 4 weeks   PT Treatment/Interventions ADLs/Self Care Home Management;Patient/family education;Therapeutic activities;Therapeutic exercise;Manual techniques;Manual lymph drainage;Compression bandaging   PT Next Visit Plan Assess effect of TubiGrip. Review elevation and exercise compliance, manual lymph draiange.    PT Home Exercise Plan Self manual lymph drainage (done by wife) and cont elevation and exercise   Recommended Other Services Faxed demographics to [employer] for [employer] and circaid for bil LE's   Consulted and Agree with Plan of Care Patient;Family member/caregiver   Family Member Consulted wife      Patient will benefit from skilled therapeutic intervention in order to improve the following deficits and impairments:  Decreased skin integrity, Increased edema, Decreased knowledge of precautions, Decreased knowledge of use of DME, Pain, Decreased activity tolerance, Postural dysfunction  Visit Diagnosis: Lymphedema, not elsewhere classified  Pain in left leg  Pain in right leg  Unsteadiness on feet     Problem List Patient Active Problem List   Diagnosis Date Noted  . Bilateral edema of lower extremity 07/05/2016  . Constipation 07/05/2016  . Chemotherapy-induced nausea and vomiting 06/21/2016  . Encounter for antineoplastic immunotherapy 05/24/2016  .  Intractable hiccups 04/11/2016  . Goals of care, counseling/discussion 03/28/2016  . 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 Protein-calorie malnutrition, severe 11/29/2015  . Encounter for antineoplastic chemotherapy 10/05/2015  . Primary lung adenocarcinoma ([place]) 08/25/2015  . Osteoarthritis of right thumb 10/21/2014  . Dupuytren's contracture of both hands 04/08/2014  . Healthcare maintenance 10/08/2013  . Post traumatic stress disorder 07/02/2013  .  Type 2 diabetes mellitus with vascular disease ([place]) 07/01/2013  . Peripheral vascular occlusive disease ([place]) 07/01/2013  . 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 Erectile dysfunction associated with type 2 diabetes mellitus ([place]) 07/01/2013  . Hyperlipidemia 07/01/2013  . Fibromyalgia 07/01/2013  . Degenerative joint disease of cervical spine 07/01/2013  . Chronic pain syndrome 07/01/2013  . Cognitive disorder 04/15/2011  . Closed head injury with brief loss of consciousness ([place]) 08/10/2010    Otelia Limes, PTA 07/11/2016, 12:11 PM  [place], [place], [address] Phone: [phone]   Fax:  [phone]  Name: DAJUAN TURNLEY MRN: [id number] Date of Birth: August 31, 1960

## 2016-07-11 NOTE — Patient Instructions (Signed)
Cancer Rehab [phone] Start with circles near neck above collarbones 10 times.  Deep Effective Breath   Standing, sitting, or laying down place both hands on the belly. Take a deep breath IN, expanding the belly; then breath OUT, contracting the belly. Repeat __5__ times. Do __2-3__ sessions per day and before each self massage.  http://[email or web address].exer.us/866   Copyright  VHI. All rights reserved.  Inguinal Nodes to Axilla - Clear   On involved side, at armpit, make _5__ pump. Then from hip proceed in sections to armpit with stationary circles or pumps _5_ times, this is your pathway. Do _1__ time per day.  Copyright  VHI. All rights reserved.  LEG: Knee to Hip - Clear   Pump up outer thigh of involved leg from knee to outer hip. Then do stationary circles from inner to outer thigh, then do outer thigh again. Next, interlace fingers behind knee IF ABLE and make in-place circles. Do _5_ times of each sequence.  Do _1__ time per day.  Copyright  VHI. All rights reserved.  LEG: Ankle to Hip Sweep   Hands on sides of ankle of involved leg, pump _5__ times up both sides of lower leg, then retrace steps up outer thigh to hip as before and back to pathway. Do _2-3_ times. Do __1_ time per day.  Copyright  VHI. All rights reserved.  FOOT: Dorsum of Foot and Toes Massage   One hand on top of foot make _5_ stationary circles or pumps, then either on top of toes or each individual toe do _5_ pumps. Then retrace all steps pumping back up both sides of lower leg, outer thigh, and then pathway. Finish with what you started with, _5_ circles at involved side arm pit. All _2-3_ times at each sequence. Do _1__ time per day.  Copyright  VHI. All rights reserved.

## 2016-07-18 ENCOUNTER — Ambulatory Visit (HOSPITAL_BASED_OUTPATIENT_CLINIC_OR_DEPARTMENT_OTHER): Payer: Medicare Other

## 2016-07-18 ENCOUNTER — Ambulatory Visit (HOSPITAL_BASED_OUTPATIENT_CLINIC_OR_DEPARTMENT_OTHER): Payer: Medicare Other | Admitting: Internal Medicine

## 2016-07-18 ENCOUNTER — Other Ambulatory Visit (HOSPITAL_BASED_OUTPATIENT_CLINIC_OR_DEPARTMENT_OTHER): Payer: Medicare Other

## 2016-07-18 VITALS — BP 113/67 | HR 116 | Temp 98.5°F | Resp 18 | Ht 67.0 in | Wt 115.6 lb

## 2016-07-18 VITALS — HR 94

## 2016-07-18 DIAGNOSIS — R000 Tachycardia, unspecified: Secondary | ICD-10-CM | POA: Diagnosis not present

## 2016-07-18 DIAGNOSIS — C784 Secondary malignant neoplasm of small intestine: Secondary | ICD-10-CM

## 2016-07-18 DIAGNOSIS — C3411 Malignant neoplasm of upper lobe, right bronchus or lung: Secondary | ICD-10-CM

## 2016-07-18 DIAGNOSIS — C3491 Malignant neoplasm of unspecified part of right bronchus or lung: Secondary | ICD-10-CM

## 2016-07-18 DIAGNOSIS — K5900 Constipation, unspecified: Secondary | ICD-10-CM

## 2016-07-18 DIAGNOSIS — R630 Anorexia: Secondary | ICD-10-CM | POA: Diagnosis not present

## 2016-07-18 DIAGNOSIS — E860 Dehydration: Secondary | ICD-10-CM

## 2016-07-18 DIAGNOSIS — R5382 Chronic fatigue, unspecified: Secondary | ICD-10-CM | POA: Diagnosis not present

## 2016-07-18 DIAGNOSIS — R600 Localized edema: Secondary | ICD-10-CM

## 2016-07-18 DIAGNOSIS — Z5112 Encounter for antineoplastic immunotherapy: Secondary | ICD-10-CM

## 2016-07-18 DIAGNOSIS — E1159 Type 2 diabetes mellitus with other circulatory complications: Secondary | ICD-10-CM

## 2016-07-18 DIAGNOSIS — C171 Malignant neoplasm of jejunum: Secondary | ICD-10-CM

## 2016-07-18 LAB — COMPREHENSIVE METABOLIC PANEL
ALT: 74 U/L — ABNORMAL HIGH (ref 0–55)
AST: 71 U/L — ABNORMAL HIGH (ref 5–34)
Albumin: 2.6 g/dL — ABNORMAL LOW (ref 3.5–5.0)
Alkaline Phosphatase: 105 U/L (ref 40–150)
Anion Gap: 9 mEq/L (ref 3–11)
BUN: 9.8 mg/dL (ref 7.0–26.0)
CO2: 28 mEq/L (ref 22–29)
Calcium: 9 mg/dL (ref 8.4–10.4)
Chloride: 99 mEq/L (ref 98–109)
Creatinine: 0.7 mg/dL (ref 0.7–1.3)
EGFR: >90 mL/min/{1.73_m2} (ref >90)
Glucose: 334 mg/dl — ABNORMAL HIGH (ref 70–140)
Potassium: 4.5 mEq/L (ref 3.5–5.1)
Sodium: 136 mEq/L (ref 136–145)
Total Bilirubin: 0.27 mg/dL (ref 0.20–1.20)
Total Protein: 6.2 g/dL — ABNORMAL LOW (ref 6.4–8.3)

## 2016-07-18 LAB — CBC WITH DIFFERENTIAL/PLATELET
BASO%: 1.2 % (ref 0.0–2.0)
Basophils Absolute: 0.1 10*3/uL (ref 0.0–0.1)
EOS%: 4 % (ref 0.0–7.0)
Eosinophils Absolute: 0.3 10*3/uL (ref 0.0–0.5)
HCT: 40.6 % (ref 38.4–49.9)
HGB: 13.1 g/dL (ref 13.0–17.1)
LYMPH%: 13.5 % — ABNORMAL LOW (ref 14.0–49.0)
MCH: 26.8 pg — ABNORMAL LOW (ref 27.2–33.4)
MCHC: 32.3 g/dL (ref 32.0–36.0)
MCV: 82.8 fL (ref 79.3–98.0)
MONO#: 0.8 10*3/uL (ref 0.1–0.9)
MONO%: 11.3 % (ref 0.0–14.0)
NEUT#: 5.2 10*3/uL (ref 1.5–6.5)
NEUT%: 70 % (ref 39.0–75.0)
Platelets: 382 10*3/uL (ref 140–400)
RBC: 4.91 10*6/uL (ref 4.20–5.82)
RDW: 18.7 % — ABNORMAL HIGH (ref 11.0–14.6)
WBC: 7.4 10*3/uL (ref 4.0–10.3)
lymph#: 1 10*3/uL (ref 0.9–3.3)

## 2016-07-18 LAB — TSH: TSH: 2.993 m(IU)/L (ref 0.320–4.118)

## 2016-07-18 MED ORDER — PEMBROLIZUMAB CHEMO INJECTION 100 MG/4ML
200.0000 mg | Freq: Once | INTRAVENOUS | Status: AC
  Administered 2016-07-18: 200 mg via INTRAVENOUS
  Filled 2016-07-18: qty 8

## 2016-07-18 MED ORDER — SODIUM CHLORIDE 0.9% FLUSH
10.0000 mL | INTRAVENOUS | Status: DC | PRN
  Administered 2016-07-18: 10 mL
  Filled 2016-07-18: qty 10

## 2016-07-18 MED ORDER — HEPARIN SOD (PORK) LOCK FLUSH 100 UNIT/ML IV SOLN
500.0000 [IU] | Freq: Once | INTRAVENOUS | Status: AC | PRN
  Administered 2016-07-18: 500 [IU]
  Filled 2016-07-18: qty 5

## 2016-07-18 MED ORDER — SODIUM CHLORIDE 0.9 % IV SOLN
Freq: Once | INTRAVENOUS | Status: AC
  Administered 2016-07-18: 12:00:00 via INTRAVENOUS

## 2016-07-18 NOTE — Progress Notes (Signed)
Per Arbie Cookey in Pharmacy, spoke with Dr Julien Nordmann, ok to tx today with [facility].   Notified Pt aware of glucose level of 334 today. Pt stated he had "stopped taking Metformin" due to decrease in food intake. Informed pt of the need due to high glucose level, pt stated he will take medications when he gets home today, as he does not have them here with him. Discussed the need to check blood sugar levels, and the s/s of hypo & hyper glycemia. Will continue to monitor.

## 2016-07-18 NOTE — Patient Instructions (Addendum)
[facility] Discharge Instructions for Patients Receiving Chemotherapy  Today you received the following chemotherapy agents Keytruda   To help prevent nausea and vomiting after your treatment, we encourage you to take your nausea medication as directed.   If you develop nausea and vomiting that is not controlled by your nausea medication, call the clinic.   BELOW ARE SYMPTOMS THAT SHOULD BE REPORTED IMMEDIATELY:  *FEVER GREATER THAN 100.5 F  *CHILLS WITH OR WITHOUT FEVER  NAUSEA AND VOMITING THAT IS NOT CONTROLLED WITH YOUR NAUSEA MEDICATION  *UNUSUAL SHORTNESS OF BREATH  *UNUSUAL BRUISING OR BLEEDING  TENDERNESS IN MOUTH AND THROAT WITH OR WITHOUT PRESENCE OF ULCERS  *URINARY PROBLEMS  *BOWEL PROBLEMS  UNUSUAL RASH Items with * indicate a potential emergency and should be followed up as soon as possible.  Feel free to call the clinic you have any questions or concerns. The clinic phone number is [phone].  Please show the [facility] at check-in to the Emergency Department and triage nurse.   Hypoglycemia (LOW BLOOD SUGAR) Hypoglycemia occurs when the level of sugar (glucose) in the blood is too low. Glucose is a type of sugar that provides the body's main source of energy. Certain hormones (insulin and glucagon) control the level of glucose in the blood. Insulin lowers blood glucose, and glucagon increases blood glucose. Hypoglycemia can result from having too much insulin in the bloodstream, or from not eating enough food that contains glucose. Hypoglycemia can happen in people who do or do not have diabetes. It can develop quickly, and it can be a medical emergency. What are the causes? Hypoglycemia occurs most often in people who have diabetes. If you have diabetes, hypoglycemia may be caused by:  Diabetes medicine.  Not eating enough, or not eating often enough.  Increased physical activity.  Drinking alcohol, especially when you have not  eaten recently.  If you do not have diabetes, hypoglycemia may be caused by:  A tumor in the pancreas. The pancreas is the organ that makes insulin.  Not eating enough, or not eating for long periods at a time (fasting).  Severe infection or illness that affects the liver, heart, or kidneys.  Certain medicines.  You may also have reactive hypoglycemia. This condition causes hypoglycemia within 4 hours of eating a meal. This may occur after having stomach surgery. Sometimes, the cause of reactive hypoglycemia is not known. What increases the risk? Hypoglycemia is more likely to develop in:  People who have diabetes and take medicines to lower blood glucose.  People who abuse alcohol.  People who have a severe illness.  What are the signs or symptoms? Hypoglycemia may not cause any symptoms. If you have symptoms, they may include:  Hunger.  Anxiety.  Sweating and feeling clammy.  Confusion.  Dizziness or feeling light-headed.  Sleepiness.  Nausea.  Increased heart rate.  Headache.  Blurry vision.  Seizure.  Nightmares.  Tingling or numbness around the mouth, lips, or tongue.  A change in speech.  Decreased ability to concentrate.  A change in coordination.  Restless sleep.  Tremors or shakes.  Fainting.  Irritability.  How is this diagnosed? Hypoglycemia is diagnosed with a blood test to measure your blood glucose level. This blood test is done while you are having symptoms. Your health care provider may also do a physical exam and review your medical history. If you do not have diabetes, other tests may be done to find the cause of your hypoglycemia. How is  this treated? This condition can often be treated by immediately eating or drinking something that contains glucose, such as:  3-4 sugar tablets (glucose pills).  Glucose gel, 15-gram tube.  Fruit juice, 4 oz (120 mL).  Regular soda (not diet soda), 4 oz (120 mL).  Low-fat milk, 4 oz  (120 mL).  Several pieces of hard candy.  Sugar or honey, 1 Tbsp.  Treating Hypoglycemia If You Have Diabetes  If you are alert and able to swallow safely, follow the 15:15 rule:  Take 15 grams of a rapid-acting carbohydrate. Rapid-acting options include: ? 1 tube of glucose gel. ? 3 glucose pills. ? 6-8 pieces of hard candy. ? 4 oz (120 mL) of fruit juice. ? 4 oz (120 ml) of regular (not diet) soda.  Check your blood glucose 15 minutes after you take the carbohydrate.  If the repeat blood glucose level is still at or below 70 mg/dL (3.9 mmol/L), take 15 grams of a carbohydrate again.  If your blood glucose level does not increase above 70 mg/dL (3.9 mmol/L) after 3 tries, seek emergency medical care.  After your blood glucose level returns to normal, eat a meal or a snack within 1 hour.  Treating Severe Hypoglycemia Severe hypoglycemia is when your blood glucose level is at or below 54 mg/dL (3 mmol/L). Severe hypoglycemia is an emergency. Do not wait to see if the symptoms will go away. Get medical help right away. Call your local emergency services (911 in the [place] Do not drive yourself to the hospital. If you have severe hypoglycemia and you cannot eat or drink, you may need an injection of glucagon. A family member or close friend should learn how to check your blood glucose and how to give you a glucagon injection. Ask your health care provider if you need to have an emergency glucagon injection kit available. Severe hypoglycemia may need to be treated in a hospital. The treatment may include getting glucose through an IV tube. You may also need treatment for the cause of your hypoglycemia. Follow these instructions at home: General instructions  Avoid any diets that cause you to not eat enough food. Talk with your health care provider before you start any new diet.  Take over-the-counter and prescription medicines only as told by your health care provider.  Limit alcohol  intake to no more than 1 drink per day for nonpregnant women and 2 drinks per day for men. One drink equals 12 oz of beer, 5 oz of wine, or 1 oz of hard liquor.  Keep all follow-up visits as told by your health care provider. This is important. If You Have Diabetes:   Make sure you know the symptoms of hypoglycemia.  Always have a rapid-acting carbohydrate snack with you to treat low blood sugar.  Follow your diabetes management plan, as told by your health care provider. Make sure you: ? Take your medicines as directed. ? Follow your exercise plan. ? Follow your meal plan. Eat on time, and do not skip meals. ? Check your blood glucose as often as directed. Make sure to check your blood glucose before and after exercise. If you exercise longer or in a different way than usual, check your blood glucose more often. ? Follow your sick day plan whenever you cannot eat or drink normally. Make this plan in advance with your health care provider.  Share your diabetes management plan with people in your workplace, school, and household.  Check your urine for ketones when  you are ill and as told by your health care provider.  Carry a medical alert card or wear medical alert jewelry. If You Have Reactive Hypoglycemia or Low Blood Sugar From Other Causes:  Monitor your blood glucose as told by your health care provider.  Follow instructions from your health care provider about eating or drinking restrictions. Contact a health care provider if:  You have problems keeping your blood glucose in your target range.  You have frequent episodes of hypoglycemia. Get help right away if:  You continue to have hypoglycemia symptoms after eating or drinking something containing glucose.  Your blood glucose is at or below 54 mg/dL (3 mmol/L).  You have a seizure.  You faint. These symptoms may represent a serious problem that is an emergency. Do not wait to see if the symptoms will go away. Get  medical help right away. Call your local emergency services (911 in the [place] Do not drive yourself to the hospital. This information is not intended to replace advice given to you by your health care provider. Make sure you discuss any questions you have with your health care provider. Document Released: 01/07/2005 Document Revised: 06/21/2015 Document Reviewed: 02/10/2015 Elsevier Interactive Patient Education  2018 [employer].  Hyperglycemia (HIGH BLOOD SUGAR) Hyperglycemia is when the sugar (glucose) level in your blood is too high. It may not cause symptoms. If you do have symptoms, they may include warning signs, such as:  Feeling more thirsty than normal.  Hunger.  Feeling tired.  Needing to pee (urinate) more than normal.  Blurry eyesight (vision).  You may get other symptoms as it gets worse, such as:  Dry mouth.  Not being hungry (loss of appetite).  Fruity-smelling breath.  Weakness.  Weight gain or loss that is not planned. Weight loss may be fast.  A tingling or numb feeling in your hands or feet.  Headache.  Skin that does not bounce back quickly when it is lightly pinched and released (poor skin turgor).  Pain in your belly (abdomen).  Cuts or bruises that heal slowly.  High blood sugar can happen to people who do or do not have diabetes. High blood sugar can happen slowly or quickly, and it can be an emergency. Follow these instructions at home: General instructions  Take over-the-counter and prescription medicines only as told by your doctor.  Do not use products that contain nicotine or tobacco, such as cigarettes and e-cigarettes. If you need help quitting, ask your doctor.  Limit alcohol intake to no more than 1 drink per day for nonpregnant women and 2 drinks per day for men. One drink equals 12 oz of beer, 5 oz of wine, or 1 oz of hard liquor.  Manage stress. If you need help with this, ask your doctor.  Keep all follow-up visits as told  by your doctor. This is important. Eating and drinking  Stay at a healthy weight.  Exercise regularly, as told by your doctor.  Drink enough fluid, especially when you: ? Exercise. ? Get sick. ? Are in hot temperatures.  Eat healthy foods, such as: ? Low-fat (lean) proteins. ? Complex carbs (complex carbohydrates), such as whole wheat bread or brown rice. ? Fresh fruits and vegetables. ? Low-fat dairy products. ? Healthy fats.  Drink enough fluid to keep your pee (urine) clear or pale yellow. If you have diabetes:  Make sure you know the symptoms of hyperglycemia.  Follow your diabetes management plan, as told by your doctor. Make sure  you: ? Take insulin and medicines as told. ? Follow your exercise plan. ? Follow your meal plan. Eat on time. Do not skip meals. ? Check your blood sugar as often as told. Make sure to check before and after exercise. If you exercise longer or in a different way than you normally do, check your blood sugar more often. ? Follow your sick day plan whenever you cannot eat or drink normally. Make this plan ahead of time with your doctor.  Share your diabetes management plan with people in your workplace, school, and household.  Check your urine for ketones when you are ill and as told by your doctor.  Carry a card or wear jewelry that says that you have diabetes. Contact a doctor if:  Your blood sugar level is higher than 240 mg/dL (13.3 mmol/L) for 2 days in a row.  You have problems keeping your blood sugar in your target range.  High blood sugar happens often for you. Get help right away if:  You have trouble breathing.  You have a change in how you think, feel, or act (mental status).  You feel sick to your stomach (nauseous), and that feeling does not go away.  You cannot stop throwing up (vomiting). These symptoms may be an emergency. Do not wait to see if the symptoms will go away. Get medical help right away. Call your local  emergency services (911 in the [place] Do not drive yourself to the hospital. Summary  Hyperglycemia is when the sugar (glucose) level in your blood is too high.  High blood sugar can happen to people who do or do not have diabetes.  Make sure you drink enough fluids, eat healthy foods, and exercise regularly.  Contact your doctor if you have problems keeping your blood sugar in your target range. This information is not intended to replace advice given to you by your health care provider. Make sure you discuss any questions you have with your health care provider. Document Released: 11/04/2008 Document Revised: 09/25/2015 Document Reviewed: 09/25/2015 Elsevier Interactive Patient Education  2017 [employer].

## 2016-07-18 NOTE — Progress Notes (Signed)
[place] [phone]   [phone]) [phone]  OFFICE PROGRESS NOTE  [name], MD [address] [place] [address]  DIAGNOSIS: Stage IV (T1b, N0, M1b) non-small cell lung cancer, adenocarcinoma presented with right upper lobe lung nodule and recent metastasis to the small intestine. This was initially diagnosed in September 2017.  Genomic Alterations Identified? ERBB2 amplification - equivocal? CDKN2A p16INK4a E88* and p14ARF T056P SMARCA4 splice site [id number]>SM SPTA1 E2022* TOP2A amplification TP53 A159P Additional Findings? Microsatellite status MS-Stable Tumor Mutation Burden TMB-Intermediate; 18 Muts/Mb Additional Disease-relevant Genes with No Reportable Alterations Identified? EGFR KRAS ALK BRAF MET RET ROS1   PRIOR THERAPY:  1) Status post right VATS with right upper lobectomy and mediastinal lymph node dissection under the care of Dr. Roxan Hockey on 10/18/2015 and the final pathology was consistent with stage IA (T1b, N0, MX). 2) upper endoscopy on 01/05/2016 showed normal esophagus, normal stomach but there was occasional mass around 3.0 CM in length circumferential nonobstructing in the jejunum. The final pathology was consistent with metastatic adenocarcinoma. 3) status post laparoscopic laparotomy and resection of proximal lesion and and distal jejunum/proximal ileum under the care of Dr. Hassell Done 1 02/27/2016. 3)  Systemic chemotherapy with carboplatin for AUC of 5 and Alimta 500 MG/M2 every 3 weeks. First dose 04/04/2016. Status post 2 cycles. Last dose was given 04/21/2016 discontinued secondary to disease progression.   CURRENT THERAPY: Second line immunotherapy with Ketruda 200 mg IV every 2 weeks, first dose 05/30/2016. Status post 2 cycles.  INTERVAL HISTORY: Brett [name] 56 y.o. male returns to the clinic today for follow-up visit accompanied by his wife. The patient is feeling a little bit better these days.  He denied having any significant weight loss or night sweats. He has no nausea, vomiting, diarrhea or constipation. He denied having any chest pain, shortness breath, cough or hemoptysis. He has no fever or chills. He tolerated the second cycle of his treatment with immunotherapy with Nat Math fairly well. He continues to have swelling of the lower extremities.  He is here today for evaluation before starting cycle #3 of his treatment.   MEDICAL HISTORY: Past Medical History:  Diagnosis Date  . Anemia 11/28/2015  . Anxiety   . Arthritis    "hands, back" (11/28/2015)  . Barrett's esophagus 07/01/2013   Without dysplasia on biopsy 09/03/2012. Repeat EGD recommended 08/2015  . Carotid artery stenosis 07/01/2013   Requiring right sided stent   . Chronic pain syndrome 07/01/2013  . Closed head injury with brief loss of consciousness ([place]) 07/22/2010   Head trapped in a hydraulic device at work.  Fracture of orbital bones on right and brief loss of consciousness per report.  . 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 There was likely a strong psycho-social overlay in regard to the cognitive deficits in the form of mood disorder with psychotic features and mixed anxiety symptomatology. His primary tested cognitive deficits are in the areas of attention, executi  . Daily headache "since 07/2010"   constantly  . Degenerative joint disease of cervical spine 07/01/2013  . Dehydration 04/11/2016  . Dupuytren's contracture of both hands 04/08/2014  . Encounter for antineoplastic chemotherapy 10/05/2015  . Encounter for antineoplastic immunotherapy 05/24/2016  . Erectile dysfunction associated with type 2 diabetes mellitus ([place]) 07/01/2013  . Fibromyalgia 07/01/2013  . Goals of care, counseling/discussion 03/28/2016  .  History of blood transfusion 11/28/2015   "suppose to get  his first today" (11/28/2015)  . Hyperlipidemia LDL goal < 100 07/01/2013  . Intractable hiccups 04/11/2016  . 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 Memory changes    "memory issues" from head injury  . Osteoarthritis of right thumb 10/21/2014  . Peripheral vascular occlusive disease ([place]) 07/01/2013   Requiring 2 arterial stents above the left knee per report  . Pneumonia ~ 2006/2007  . Post traumatic stress disorder 07/01/2013  . Primary lung adenocarcinoma ([place]) dx'd 08/2015   "right lung"  . Severe major depression with psychotic features ([place]) 04/15/2011  . Tobacco abuse 07/01/2013  . Tobacco abuse   . Type 2 diabetes mellitus with vascular disease ([place]) 07/01/2013   Left lower extremity and right carotid stenting    ALLERGIES:  is allergic to gabapentin; lyrica [pregabalin]; celebrex [celecoxib]; and contrast media [iodinated diagnostic agents].  MEDICATIONS:  Current Outpatient Prescriptions  Medication Sig Dispense Refill  . aspirin EC 81 MG tablet Take 81 mg by mouth daily.     Marland Kitchen atorvastatin (LIPITOR) 40 MG tablet Take 1 tablet (40 mg total) by mouth daily. (Patient not taking: Reported on 06/21/2016) 90 tablet 3  . clopidogrel (PLAVIX) 75 MG tablet Take 75 mg by mouth at bedtime.     . dronabinol (MARINOL) 2.5 MG capsule Take 1 capsule (2.5 mg total) by mouth 2 (two) times daily before a meal. 60 capsule 0  . Ferrous Sulfate (IRON) 325 (65 Fe) MG TABS Take 325 mg by mouth 2 (two) times daily with a meal. (Patient not taking: Reported on 06/21/2016) 120 each 0  . HYDROcodone-acetaminophen (NORCO) 10-325 MG tablet Take 1 tablet by mouth every 8 (eight) hours as needed for severe pain. 90 tablet 0  . lidocaine-prilocaine (EMLA) cream Apply 1 application topically as needed. 30 g 0  . lisinopril (PRINIVIL,ZESTRIL) 5 MG tablet Take 0.5 tablets (2.5 mg total) by mouth daily. (Patient not taking: Reported on 06/21/2016) 90 tablet 3  . metFORMIN (GLUCOPHAGE) 500 MG tablet Take 1 tablet (500  mg total) by mouth 2 (two) times daily with a meal. (Patient not taking: Reported on 06/21/2016) 180 tablet 3  . metoCLOPramide (REGLAN) 10 MG tablet TAKE 1 TABLET BY MOUTH FOUR TIMES DAILY AS NEEDED AS DIRECTED 30 tablet 0  . ondansetron (ZOFRAN) 4 MG tablet Take 1 tablet (4 mg total) by mouth 3 (three) times daily. (Patient not taking: Reported on 06/21/2016) 20 tablet 0  . oxyCODONE-acetaminophen (PERCOCET) 10-325 MG tablet Take 1 tablet by mouth every 4 (four) hours as needed for pain. 180 tablet 0  . pantoprazole (PROTONIX) 40 MG tablet TAKE 1 TABLET(40 MG) BY MOUTH DAILY (Patient not taking: Reported on 06/27/2016) 90 tablet 0  . prochlorperazine (COMPAZINE) 10 MG tablet Take 1 tablet (10 mg total) by mouth every 6 (six) hours as needed for nausea or vomiting. (Patient not taking: Reported on 06/21/2016) 30 tablet 0  . prochlorperazine (COMPAZINE) 25 MG suppository Place 1 suppository (25 mg total) rectally every 12 (twelve) hours as needed for refractory nausea / vomiting. (Patient not taking: Reported on 06/27/2016) 12 suppository 11  . senna-docusate (SENOKOT-S) 8.6-50 MG tablet Take 1-2 tablets by mouth 2 (two) times daily as needed for mild constipation or moderate constipation. 30 tablet 1  . sitaGLIPtin (JANUVIA) 100 MG tablet Take 1 tablet (100 mg total) by mouth daily. (Patient not taking: Reported on 06/21/2016) 90 tablet 3  . sorbitol 70 % solution Take 15  cc every 4 hours until bowel movement 473 mL 0   No current facility-administered medications for this visit.     SURGICAL HISTORY:  Past Surgical History:  Procedure Laterality Date  . CAROTID STENT Right ?2014  . COLONOSCOPY N/A 11/30/2015   Procedure: COLONOSCOPY;  Surgeon: Teena Irani, MD;  Location: [facility] ENDOSCOPY;  Service: Endoscopy;  Laterality: N/A;  . ESOPHAGOGASTRODUODENOSCOPY N/A 11/30/2015   Procedure: ESOPHAGOGASTRODUODENOSCOPY (EGD);  Surgeon: Teena Irani, MD;  Location: [facility] ENDOSCOPY;  Service: Endoscopy;  Laterality: N/A;  .  ESOPHAGOGASTRODUODENOSCOPY (EGD) WITH PROPOFOL N/A 01/05/2016   Procedure: ESOPHAGOGASTRODUODENOSCOPY (EGD) WITH PROPOFOL;  Surgeon: Teena Irani, MD;  Location: [facility] ENDOSCOPY;  Service: Endoscopy;  Laterality: N/A;  . FEMORAL ARTERY STENT Left 05/2012; ~ 2015   [facility] Endo 06/04/2012; Raechel Chute report  . FRACTURE SURGERY    . GIVENS CAPSULE STUDY N/A 12/22/2015   Procedure: GIVENS CAPSULE STUDY;  Surgeon: Wonda Horner, MD;  Location: [facility] ENDOSCOPY;  Service: Endoscopy;  Laterality: N/A;  . HARDWARE REMOVAL Right 11/15/2011   Removal of deep frontozygomatic orbital hardware/notes 11/15/2011  . HERNIA REPAIR  0315   Umbilical  . LAPAROSCOPY N/A 02/27/2016   Procedure: LAPAROSCOPY, LAPAROTOMY  WITH TWO SMALL BOWEL RESECTION;  Surgeon: Johnathan Hausen, MD;  Location: [facility] ORS;  Service: General;  Laterality: N/A;  . ORIF ORBITAL FRACTURE Right 08/15/2010    caught in a hydraulic machine; open reduction internal fixation of orbital rim fracture and open reduction of zygomatic arch fracture  Ar[facility] Endo 10/13/2009  . PORTACATH PLACEMENT Left 04/04/2016   Procedure: INSERTION PORT-A-CATH LEFT CHEST;  Surgeon: Melrose Nakayama, MD;  Location: [facility];  Service: Thoracic;  Laterality: Left;  Marland Kitchen VIDEO ASSISTED THORACOSCOPY (VATS)/ LOBECTOMY Right 10/18/2015   Procedure: VIDEO ASSISTED THORACOSCOPY (VATS)/ LOBECTOMY;  Surgeon: Melrose Nakayama, MD;  Location: [facility];  Service: Thoracic;  Laterality: Right;  Marland Kitchen VIDEO BRONCHOSCOPY Bilateral 09/21/2015   Procedure: VIDEO BRONCHOSCOPY WITH FLUORO;  Surgeon: Juanito Doom, MD;  Location: [facility] ENDOSCOPY;  Service: Cardiopulmonary;  Laterality: Bilateral;    REVIEW OF SYSTEMS:  Constitutional: positive for fatigue Eyes: negative Ears, nose, mouth, throat, and face: negative Respiratory: negative Cardiovascular: negative Gastrointestinal: negative Genitourinary:negative Integument/breast: negative Hematologic/lymphatic: negative Musculoskeletal:negative Neurological:  negative Behavioral/Psych: negative Endocrine: negative Allergic/Immunologic: negative   PHYSICAL EXAMINATION: General appearance: alert, cooperative, fatigued and no distress Head: Normocephalic, without obvious abnormality, atraumatic Neck: no adenopathy, no JVD, supple, symmetrical, trachea midline and thyroid not enlarged, symmetric, no tenderness/mass/nodules Lymph nodes: Cervical, supraclavicular, and axillary nodes normal. Resp: clear to auscultation bilaterally Back: symmetric, no curvature. ROM normal. No CVA tenderness. Cardio: Tachycardia GI: soft, non-tender; bowel sounds normal; no masses,  no organomegaly Extremities: extremities normal, atraumatic, no cyanosis or edema Neurologic: Alert and oriented X 3, normal strength and tone. Normal symmetric reflexes. Normal coordination and gait  ECOG PERFORMANCE STATUS: 1 - Symptomatic but completely ambulatory  Blood pressure 113/67, pulse (!) 116, temperature 98.5 F (36.9 C), temperature source Oral, resp. rate 18, height 5' 7" (1.702 m), weight 115 lb 9.6 oz (52.4 kg), SpO2 100 %.  LABORATORY DATA: Lab Results  Component Value Date   WBC 7.4 07/18/2016   HGB 13.1 07/18/2016   HCT 40.6 07/18/2016   MCV 82.8 07/18/2016   PLT 382 07/18/2016      Chemistry      Component Value Date/Time   NA 136 07/18/2016 1001   K 4.5 07/18/2016 1001   CL 103 04/04/2016 1206   CO2 28 07/18/2016 1001  BUN 9.8 07/18/2016 1001   CREATININE 0.7 07/18/2016 1001      Component Value Date/Time   CALCIUM 9.0 07/18/2016 1001   ALKPHOS 105 07/18/2016 1001   AST 71 (H) 07/18/2016 1001   ALT 74 (H) 07/18/2016 1001   BILITOT 0.27 07/18/2016 1001       RADIOGRAPHIC STUDIES: No results found.   ASSESSMENT AND PLAN:  This is a very pleasant 56 years old white male with metastatic non-small cell lung cancer, adenocarcinoma status post right upper lobectomy with lymph node dissection. Unfortunately the patient was found to have metastatic  disease in the jejunum and terminal ileum.  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 The patient was started on treatment with systemic chemotherapy with carboplatin and Alimta for 2 cycles discontinued secondary to intolerance and disease progression. He is currently on treatment with second line immunotherapy with Ketruda (pembrolizumab) 200 mg IV every 3 weeks is status post 2 cycles and tolerated the previous 2 cycles of his treatment fairly well. I recommended for the patient to proceed with cycle #3 today as a scheduled. For the tachycardia and dehydration, I will arrange for the patient to receive 1 L of intravenous normal saline in the clinic today. For the lack of appetite and weight loss, he will continue his treatment with Marinol 2.5 mg by mouth twice a day. For constipation he is currently on sorbitol. The patient would come back for follow-up visit in 3 weeks for reevaluation with repeat CT scan of the chest, abdomen and pelvis for restaging of his disease. He was advised to call immediately if he has any concerning symptoms in the interval. The patient voices understanding of current disease status and treatment options and is in agreement with the current care plan. All questions were answered. The patient knows to call the clinic with any problems, questions or concerns. We can certainly see the patient much sooner if necessary.  Disclaimer: This note was dictated with voice recognition software. Similar sounding words can inadvertently be transcribed and may not be corrected upon review.

## 2016-07-19 ENCOUNTER — Encounter: Payer: Self-pay | Admitting: Internal Medicine

## 2016-07-22 ENCOUNTER — Telehealth: Payer: Self-pay | Admitting: Internal Medicine

## 2016-07-22 NOTE — Telephone Encounter (Signed)
Appts already scheduled per 6/28 los. No additional appts added.

## 2016-07-26 ENCOUNTER — Ambulatory Visit: Payer: Medicare Other | Admitting: Physical Therapy

## 2016-07-29 ENCOUNTER — Ambulatory Visit: Payer: Medicare Other | Attending: Internal Medicine | Admitting: Physical Therapy

## 2016-07-29 DIAGNOSIS — M79604 Pain in right leg: Secondary | ICD-10-CM | POA: Insufficient documentation

## 2016-07-29 DIAGNOSIS — M79605 Pain in left leg: Secondary | ICD-10-CM | POA: Diagnosis not present

## 2016-07-29 DIAGNOSIS — I890 Lymphedema, not elsewhere classified: Secondary | ICD-10-CM | POA: Diagnosis not present

## 2016-07-29 NOTE — Therapy (Addendum)
[place], [place], [address] Phone: [phone]   Fax:  [phone]  Physical Therapy Treatment  Patient Details  Name: Brett [name] MRN: [id number] Date of Birth: [birth year] Referring Provider: Dr. Minus Liberty   Encounter Date: 07/29/2016      PT End of Session - 07/29/16 1228    Visit Number 3   Number of Visits 13   Date for PT Re-Evaluation 08/08/16   PT Start Time 1110   PT Stop Time 1155   PT Time Calculation (min) 45 min   Activity Tolerance Patient tolerated treatment well   Behavior During Therapy [facility] for tasks assessed/performed      Past Medical History:  Diagnosis Date  . Anemia 11/28/2015  . Anxiety   . Arthritis    "hands, back" (11/28/2015)  . Barrett's esophagus 07/01/2013   Without dysplasia on biopsy 09/03/2012. Repeat EGD recommended 08/2015  . Carotid artery stenosis 07/01/2013   Requiring right sided stent   . Chronic pain syndrome 07/01/2013  . Closed head injury with brief loss of consciousness ([place]) 07/22/2010   Head trapped in a hydraulic device at work.  Fracture of orbital bones on right and brief loss of consciousness per report.  . 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 There was likely a strong psycho-social overlay in regard to the cognitive deficits in the form of mood disorder with psychotic features and mixed anxiety symptomatology. His primary tested cognitive deficits are in the areas of attention, executi  . Daily headache "since 07/2010"   constantly  . Degenerative joint disease of cervical spine 07/01/2013  . Dehydration 04/11/2016  . Dupuytren's contracture of both hands 04/08/2014  . Encounter for antineoplastic chemotherapy 10/05/2015  . Encounter for antineoplastic immunotherapy 05/24/2016  . Erectile  dysfunction associated with type 2 diabetes mellitus ([place]) 07/01/2013  . Fibromyalgia 07/01/2013  . Goals of care, counseling/discussion 03/28/2016  . History of blood transfusion 11/28/2015   "suppose to get his first today" (11/28/2015)  . Hyperlipidemia LDL goal < 100 07/01/2013  . Intractable hiccups 04/11/2016  . 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 Memory changes    "memory issues" from head injury  . Osteoarthritis of right thumb 10/21/2014  . Peripheral vascular occlusive disease ([place]) 07/01/2013   Requiring 2 arterial stents above the left knee per report  . Pneumonia ~ 2006/2007  . Post traumatic stress disorder 07/01/2013  . Primary lung adenocarcinoma ([place]) dx'd 08/2015   "right lung"  . Severe major depression with psychotic features ([place]) 04/15/2011  . Tobacco abuse 07/01/2013  . Tobacco abuse   . Type 2 diabetes mellitus with vascular disease ([place]) 07/01/2013   Left lower extremity and right carotid stenting    Past Surgical History:  Procedure Laterality Date  . CAROTID STENT Right ?2014  . COLONOSCOPY N/A 11/30/2015   Procedure: COLONOSCOPY;  Surgeon: Teena Irani, MD;  Location: [facility] ENDOSCOPY;  Service: Endoscopy;  Laterality: N/A;  . ESOPHAGOGASTRODUODENOSCOPY N/A 11/30/2015   Procedure: ESOPHAGOGASTRODUODENOSCOPY (EGD);  Surgeon: Teena Irani, MD;  Location: [facility] ENDOSCOPY;  Service: Endoscopy;  Laterality: N/A;  . ESOPHAGOGASTRODUODENOSCOPY (EGD) WITH PROPOFOL N/A 01/05/2016   Procedure: ESOPHAGOGASTRODUODENOSCOPY (EGD) WITH PROPOFOL;  Surgeon: Teena Irani, MD;  Location: [facility] ENDOSCOPY;  Service: Endoscopy;  Laterality: N/A;  . FEMORAL ARTERY STENT Left 05/2012; ~ 2015   Archie Endo 06/04/2012; Raechel Chute report  . FRACTURE  SURGERY    . GIVENS CAPSULE STUDY N/A 12/22/2015   Procedure: GIVENS CAPSULE STUDY;  Surgeon: Wonda Horner, MD;  Location: [facility] ENDOSCOPY;  Service: Endoscopy;  Laterality: N/A;  . HARDWARE REMOVAL Right 11/15/2011   Removal of deep frontozygomatic orbital  hardware/notes 11/15/2011  . HERNIA REPAIR  [id number]   Umbilical  . LAPAROSCOPY N/A 02/27/2016   Procedure: LAPAROSCOPY, LAPAROTOMY  WITH TWO SMALL BOWEL RESECTION;  Surgeon: Johnathan Hausen, MD;  Location: [facility] ORS;  Service: General;  Laterality: N/A;  . ORIF ORBITAL FRACTURE Right 08/15/2010    caught in a hydraulic machine; open reduction internal fixation of orbital rim fracture and open reduction of zygomatic arch fracture  Archie Endo 10/13/2009  . PORTACATH PLACEMENT Left 04/04/2016   Procedure: INSERTION PORT-A-CATH LEFT CHEST;  Surgeon: Melrose Nakayama, MD;  Location: [place];  Service: Thoracic;  Laterality: Left;  Marland Kitchen VIDEO ASSISTED THORACOSCOPY (VATS)/ LOBECTOMY Right 10/18/2015   Procedure: VIDEO ASSISTED THORACOSCOPY (VATS)/ LOBECTOMY;  Surgeon: Melrose Nakayama, MD;  Location: [place];  Service: Thoracic;  Laterality: Right;  Marland Kitchen VIDEO BRONCHOSCOPY Bilateral 09/21/2015   Procedure: VIDEO BRONCHOSCOPY WITH FLUORO;  Surgeon: Juanito Doom, MD;  Location: [facility] ENDOSCOPY;  Service: Cardiopulmonary;  Laterality: Bilateral;    There were no vitals filed for this visit.      Subjective Assessment - 07/29/16 1111    Subjective [place] a couple of weeks ago and didn't go to the doctor, but now it's gotten sore.  Has to support his chin with his hand. Knows he needs to see the doctor about it.  Has been alternating the Tubigrip and TG soft for his legs.  Pt. himself has been doing some manual lymph drainage rather than his wife doing it; also has been doing ankle pumps and circles, and keeping legs up; wife says he has been urinating more often. Has not yet gotten a call from [employer]. Wife spoke with Medicaid rep and found out that he does have Medicaid.   Currently in Pain? Yes   Pain Score 8    Pain Location Leg   Pain Orientation Right;Left;Lower   Pain Descriptors / Indicators Other (Comment)  constant aggravating   Multiple Pain Sites Yes   Pain Score 10   Pain Location Head  and  neck   Pain Descriptors / Indicators Constant   Pain Type Chronic pain               LYMPHEDEMA/ONCOLOGY QUESTIONNAIRE - 07/29/16 1121      Right Lower Extremity Lymphedema   At Midpatella/Popliteal Crease 31.8 cm   30 cm Proximal to Floor at Lateral Plantar Foot 30.3 cm   20 cm Proximal to Floor at Lateral Plantar Foot 24.'7 1   10 ' cm Proximal to Floor at Lateral Malleoli 22.7 cm   5 cm Proximal to 1st MTP Joint 23.5 cm   Across MTP Joint 22.5 cm   Around Proximal Great Toe 7.5 cm     Left Lower Extremity Lymphedema   At Midpatella/Popliteal Crease 31.8 cm   30 cm Proximal to Floor at Lateral Plantar Foot 28.7 cm   20 cm Proximal to Floor at Lateral Plantar Foot 23.6 cm   10 cm Proximal to Floor at Lateral Malleoli 22.4 cm   5 cm Proximal to 1st MTP Joint 23 cm   Around Proximal Great Toe 7.5 cm                  [facility] Adult PT Treatment/Exercise -  07/29/16 0001      Manual Therapy   Edema Management circumferences remeasured   Manual Lymphatic Drainage (MLD) In Supine: Short neck, 5 diaphragmatic breaths, Rt axillary nodes, Rt inguino-axillary anastomosis, and Rt LE from dorsal foot to lateral thigh working from proximal to distal then retracing all steps; then same to [place] - 07/29/16 1130      CC Long Term Goal  #1   Title Pt will report the pain in both legs has decreased by 50 % overall    Baseline reports 10% improvement as of 07/29/16   Status Partially Met     CC Long Term Goal  #2   Title (P)  circumference of right lower extremity at 10 cm proximal to the floor at lateral foot will decrease by 2 cm (to 24.5 cm)    Baseline (P)  26.5cm on 07/09/2016; 22.7 on 07/29/16   Status (P)  Achieved     CC Long Term Goal  #3   Title (P)  circumference of left lower extremity at 10 cm proximal to the floor at lateral foot will decrease by 2 cm (to 25cm)    Baseline (P)  27 cm on 07/09/2016; 22.4 on  07/29/16   Status (P)  Achieved     CC Long Term Goal  #4   Title (P)  Pt will be independent in conservative measures of elevation and exercise to help control LE lymphedema    Status (P)  Achieved     CC Long Term Goal  #5   Title (P)  Pt will report he is knowledgable in use of compression to help conrol LE swelling    Status (P)  Partially Met            Plan - 07/29/16 1228    Clinical Impression Statement Pt. has managed extremely well with conservative self-treatment of his leg swelling. He has had significant reductions and has met goals for that.  He has not heard from the fitters about garments yet.  We decided to hold off on further treatment since he has responded so well.  He will return for follow-up in 2-3 weeks to monitor his progress.  Fitter was emailed today about his needs. He does have a problem with his neck since falling a couple of weeks ago. Both he and his wife were told he needs to see a doctor about this.    Rehab Potential Good   Clinical Impairments Affecting Rehab Potential fragile skin and leg pain that may not tolerate aggressive bandaging.   PT Frequency --  will return for follow-up in 2-3 weeks   PT Duration 3 weeks   PT Treatment/Interventions ADLs/Self Care Home Management;Patient/family education;Therapeutic activities;Therapeutic exercise;Manual techniques;Manual lymph drainage;Compression bandaging   PT Next Visit Plan Pt. to return in 2-3 weeks to monitor how he is doing with self-care.  Remeasure legs at that time.  Review self-manual lymph drainage or other self-care.  Check on whether he has been fitted for garments. He may be ready for discharge then, or else will need renewal.   PT Home Exercise Plan Self manual lymph drainage and cont elevation and exercise   Recommended Other Services emailed [employer] about fitting   Consulted and Agree with Plan of Care Patient;Family member/caregiver      Patient will benefit from skilled therapeutic  intervention in order to improve the following deficits and impairments:  Decreased skin integrity, Increased edema, Decreased knowledge of precautions, Decreased knowledge of use of DME, Pain, Decreased activity tolerance, Postural dysfunction  Visit Diagnosis: Lymphedema, not elsewhere classified  Pain in left leg  Pain in right leg     Problem List Patient Active Problem List   Diagnosis Date Noted  . Bilateral edema of lower extremity 07/05/2016  . Constipation 07/05/2016  . Chemotherapy-induced nausea and vomiting 06/21/2016  . Encounter for antineoplastic immunotherapy 05/24/2016  . Intractable hiccups 04/11/2016  . Goals of care, counseling/discussion 03/28/2016  . 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 Protein-calorie malnutrition, severe 11/29/2015  . Encounter for antineoplastic chemotherapy 10/05/2015  . Primary lung adenocarcinoma ([place]) 08/25/2015  . Osteoarthritis of right thumb 10/21/2014  . Dupuytren's contracture of both hands 04/08/2014  . Healthcare maintenance 10/08/2013  . Post traumatic stress disorder 07/02/2013  . Type 2 diabetes mellitus with vascular disease ([place]) 07/01/2013  . Peripheral vascular occlusive disease ([place]) 07/01/2013  . 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 Erectile dysfunction associated with type 2 diabetes mellitus ([place]) 07/01/2013  . Hyperlipidemia 07/01/2013  . Fibromyalgia 07/01/2013  . Degenerative joint disease of cervical spine 07/01/2013  . Chronic pain syndrome 07/01/2013  . Cognitive disorder 04/15/2011  . Closed head injury with brief loss of consciousness ([place]) 08/10/2010    Brett [name] 07/29/2016, 12:36 PM  [place], [place], [address] Phone: [phone]   Fax:  [phone]  Name: Brett [name] MRN: [id number] Date of Birth: 1960/12/16  Serafina Royals, PT 07/29/16 12:36 PM  PHYSICAL  THERAPY DISCHARGE SUMMARY  Visits from Start of Care: 3  Current functional level related to goals / functional outcomes: Goals partially met as noted above.  Patient did very well in just a few sessions.  The plan was for him to continue self-management for 2-3 weeks, then return to check status and possibly discharge. He did not return.   Remaining deficits: Unknown, as patient did not return   Education / Equipment: Self-manual lymph drainage, compression garments  Plan: Patient agrees to discharge.  Patient goals were partially met. Patient is being discharged due to being pleased with the current functional level.  ?????    Serafina Royals, PT 04/17/17 4:04 PM

## 2016-07-31 ENCOUNTER — Other Ambulatory Visit: Payer: Self-pay | Admitting: Internal Medicine

## 2016-07-31 ENCOUNTER — Encounter: Payer: Self-pay | Admitting: Physical Therapy

## 2016-07-31 DIAGNOSIS — K2270 Barrett's esophagus without dysplasia: Secondary | ICD-10-CM

## 2016-08-02 ENCOUNTER — Encounter: Payer: Self-pay | Admitting: Physical Therapy

## 2016-08-05 ENCOUNTER — Encounter: Payer: Self-pay | Admitting: Physical Therapy

## 2016-08-07 ENCOUNTER — Ambulatory Visit (HOSPITAL_COMMUNITY)
Admission: RE | Admit: 2016-08-07 | Discharge: 2016-08-07 | Disposition: A | Discharge status: Home / Self Care | Payer: Medicare Other | Source: Ambulatory Visit | Attending: Internal Medicine | Admitting: Internal Medicine

## 2016-08-07 ENCOUNTER — Encounter (HOSPITAL_COMMUNITY): Payer: Self-pay

## 2016-08-07 ENCOUNTER — Ambulatory Visit (INDEPENDENT_AMBULATORY_CARE_PROVIDER_SITE_OTHER): Payer: Medicare Other | Admitting: Internal Medicine

## 2016-08-07 ENCOUNTER — Encounter: Payer: Self-pay | Admitting: Physical Therapy

## 2016-08-07 VITALS — BP 110/74 | HR 108 | Temp 98.1°F | Wt 109.4 lb

## 2016-08-07 DIAGNOSIS — F17211 Nicotine dependence, cigarettes, in remission: Secondary | ICD-10-CM

## 2016-08-07 DIAGNOSIS — C3491 Malignant neoplasm of unspecified part of right bronchus or lung: Secondary | ICD-10-CM

## 2016-08-07 DIAGNOSIS — E1151 Type 2 diabetes mellitus with diabetic peripheral angiopathy without gangrene: Secondary | ICD-10-CM | POA: Diagnosis not present

## 2016-08-07 DIAGNOSIS — Z79899 Other long term (current) drug therapy: Secondary | ICD-10-CM | POA: Diagnosis not present

## 2016-08-07 DIAGNOSIS — Z5112 Encounter for antineoplastic immunotherapy: Secondary | ICD-10-CM

## 2016-08-07 DIAGNOSIS — Z9181 History of falling: Secondary | ICD-10-CM | POA: Diagnosis not present

## 2016-08-07 DIAGNOSIS — M542 Cervicalgia: Secondary | ICD-10-CM

## 2016-08-07 DIAGNOSIS — E1159 Type 2 diabetes mellitus with other circulatory complications: Secondary | ICD-10-CM

## 2016-08-07 DIAGNOSIS — C3490 Malignant neoplasm of unspecified part of unspecified bronchus or lung: Secondary | ICD-10-CM

## 2016-08-07 DIAGNOSIS — C799 Secondary malignant neoplasm of unspecified site: Secondary | ICD-10-CM | POA: Diagnosis not present

## 2016-08-07 DIAGNOSIS — R1310 Dysphagia, unspecified: Secondary | ICD-10-CM

## 2016-08-07 DIAGNOSIS — R600 Localized edema: Secondary | ICD-10-CM

## 2016-08-07 DIAGNOSIS — C171 Malignant neoplasm of jejunum: Secondary | ICD-10-CM

## 2016-08-07 MED ORDER — IOPAMIDOL (ISOVUE-300) INJECTION 61%
INTRAVENOUS | Status: AC
  Filled 2016-08-07: qty 100

## 2016-08-07 NOTE — Progress Notes (Signed)
Internal Medicine Clinic Attending  I saw and evaluated the patient.  I personally confirmed the key portions of the history and exam documented by Dr. Johny Chess and I reviewed pertinent patient test results.  The assessment, diagnosis, and plan were formulated together and I agree with the documentation in the resident's note. If imaging doesn't reveal etiology, consider paraneoplastic neurologic d/o like myasthenia gravis.

## 2016-08-07 NOTE — Progress Notes (Signed)
CC: Neck pain   HPI:  Brett [name] is a 56 y.o. M with past medical history of metastatic lung adenocarcinoma (currently on Keytruda), DM, PVD, LE swelling who presents to clinic today for neck pain.   Brett [name] reports he fell 2-3 weeks ago but did not seek medical attention at the time because he initially did not experience much pain. This was a mechanical fall that occurred when getting up from a wheelchair and moving down a step, he hit his leg on the wheel and fell, denies LOC, near-syncope prior to fall. Starting two days after the fall, he reports constant neck pain, described as a tight feeling, worsens with movement and is relieved by propping the weight of his head. He feels weak though he feels this may be related to his rapid decline in weight and this pain. Denies numbness/tingling in extremities. He has also started to feel difficulty with swallowing, both liquids and solids. Feels like food is stuck and will cough up bits of food after a meal. He denies SOB, wheezing, or cough, does not feel like food enters his trachea.   Past Medical History:  Diagnosis Date  . Anemia 11/28/2015  . Anxiety   . Arthritis    "hands, back" (11/28/2015)  . Barrett's esophagus 07/01/2013   Without dysplasia on biopsy 09/03/2012. Repeat EGD recommended 08/2015  . Carotid artery stenosis 07/01/2013   Requiring right sided stent   . Chronic pain syndrome 07/01/2013  . Closed head injury with brief loss of consciousness ([place]) 07/22/2010   Head trapped in a hydraulic device at work.  Fracture of orbital bones on right and brief loss of consciousness per report.  . 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 There was likely a strong psycho-social overlay in regard to the cognitive deficits in the form of mood disorder with psychotic features and mixed anxiety  symptomatology. His primary tested cognitive deficits are in the areas of attention, executi  . Daily headache "since 07/2010"   constantly  . Degenerative joint disease of cervical spine 07/01/2013  . Dehydration 04/11/2016  . Dupuytren's contracture of both hands 04/08/2014  . Encounter for antineoplastic chemotherapy 10/05/2015  . Encounter for antineoplastic immunotherapy 05/24/2016  . Erectile dysfunction associated with type 2 diabetes mellitus ([place]) 07/01/2013  . Fibromyalgia 07/01/2013  . Goals of care, counseling/discussion 03/28/2016  . History of blood transfusion 11/28/2015   "suppose to get his first today" (11/28/2015)  . Hyperlipidemia LDL goal < 100 07/01/2013  . Intractable hiccups 04/11/2016  . 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 Memory changes    "memory issues" from head injury  . Osteoarthritis of right thumb 10/21/2014  . Peripheral vascular occlusive disease ([place]) 07/01/2013   Requiring 2 arterial stents above the left knee per report  . Pneumonia ~ 2006/2007  . Post traumatic stress disorder 07/01/2013  . 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 Severe major depression with psychotic features ([place]) 04/15/2011  . Tobacco abuse 07/01/2013  . Tobacco abuse   . Type 2 diabetes mellitus with vascular disease ([place]) 07/01/2013   Left lower extremity and right carotid stenting   Review of Systems:  Review of Systems  Constitutional: Negative for fever.  Respiratory: Negative for cough, shortness of breath and wheezing.   Musculoskeletal: Positive for falls and neck pain.  Neurological: Negative for tingling, sensory  change and focal weakness.     Physical Exam:  Vitals:   08/07/16 1117  BP: 110/74  Pulse: (!) 108  Temp: 98.1 F (36.7 C)  TempSrc: Oral  SpO2: 99%  Weight: 109 lb 6.4 oz (49.6 kg)   Physical Exam  Constitutional: He is oriented to person, place, and time.  Frail gentleman, appearing older than stated age   Cardiovascular:    Slightly tachycardic   Musculoskeletal:  Slight cervical midline tenderness, tenderness to palpation of bilateral trapezius, sternocleidomastoid and cervical paraspinal muscles, No lumbar or thoracic midline tenderness. Decreased muscle mass throughout   Neurological: He is alert and oriented to person, place, and time.  Gross sensation intact, 4/5 strength in various muscle groups likely secondary to muscle atrophy rather than focal deficits   Skin: Skin is warm and dry. Capillary refill takes less than 2 seconds.    Assessment & Plan:   See Encounters Tab for problem based charting.  Patient seen with Dr. Lynnae January

## 2016-08-07 NOTE — Assessment & Plan Note (Addendum)
His neck pain after his recent fall seems to be primarily musculoskeletal with most of his tenderness in muscle groups. However, given his malignancy history, as well as the new onset dysphagia he has described, imaging is appropriate to better investigate the etiology of his sx. After consultation with Radiology, a CT Neck w contrast was determined to be the best imaging to obtain to investigate for bony abnormalities, new malignancy or other masses. This will be performed today while pt is being scanned for his previously scheduled oncology surveillance. Depending on the results, a barium swallow may be needed for his dysphagia or an MRI to evaluate for soft tissue injury not well visualized with CT.   -F/u on imaging results -Encouraged OTC topical therapy for muscle pain, currently taking opiod+acetaminophen medications  -RTC in 2-3 weeks to reassess sx

## 2016-08-07 NOTE — Assessment & Plan Note (Signed)
Pt has new onset dysphagia for both solids and liquids. He states this is bothersome but he is able to maintain adequate PO intake. The timing of his onset seems to correspond with his fall. See neck pain A&P for full details of imaging/workup plan. His dysphagia may need to be addressed with further evaluation depending on results.

## 2016-08-07 NOTE — Patient Instructions (Addendum)
Thank you for coming in Brett [name].   We put in to have a CT scan of your neck while you're at [facility] for your other scans. For your pain, you can try OTC creams to put on your painful muscles. Generic creams should be as effective as brand name and some people have good results with creams containing Caipsaicin or Lidocaine.

## 2016-08-08 ENCOUNTER — Ambulatory Visit: Payer: Medicare Other | Admitting: Nutrition

## 2016-08-08 ENCOUNTER — Telehealth: Payer: Self-pay | Admitting: Internal Medicine

## 2016-08-08 ENCOUNTER — Encounter: Payer: Self-pay | Admitting: Internal Medicine

## 2016-08-08 ENCOUNTER — Ambulatory Visit (HOSPITAL_COMMUNITY)
Admission: RE | Admit: 2016-08-08 | Discharge: 2016-08-08 | Disposition: A | Discharge status: Home / Self Care | Payer: Medicare Other | Source: Ambulatory Visit | Attending: Internal Medicine | Admitting: Internal Medicine

## 2016-08-08 ENCOUNTER — Ambulatory Visit (HOSPITAL_BASED_OUTPATIENT_CLINIC_OR_DEPARTMENT_OTHER): Payer: Medicare Other

## 2016-08-08 ENCOUNTER — Other Ambulatory Visit (HOSPITAL_BASED_OUTPATIENT_CLINIC_OR_DEPARTMENT_OTHER): Payer: Medicare Other

## 2016-08-08 ENCOUNTER — Encounter (HOSPITAL_COMMUNITY): Payer: Self-pay

## 2016-08-08 ENCOUNTER — Ambulatory Visit: Payer: Medicare Other

## 2016-08-08 ENCOUNTER — Ambulatory Visit (HOSPITAL_BASED_OUTPATIENT_CLINIC_OR_DEPARTMENT_OTHER): Payer: Medicare Other | Admitting: Internal Medicine

## 2016-08-08 VITALS — BP 124/78 | HR 102 | Temp 97.9°F | Resp 18 | Ht 67.0 in | Wt 109.7 lb

## 2016-08-08 DIAGNOSIS — R591 Generalized enlarged lymph nodes: Secondary | ICD-10-CM | POA: Insufficient documentation

## 2016-08-08 DIAGNOSIS — E1159 Type 2 diabetes mellitus with other circulatory complications: Secondary | ICD-10-CM | POA: Insufficient documentation

## 2016-08-08 DIAGNOSIS — M47816 Spondylosis without myelopathy or radiculopathy, lumbar region: Secondary | ICD-10-CM | POA: Insufficient documentation

## 2016-08-08 DIAGNOSIS — R1310 Dysphagia, unspecified: Secondary | ICD-10-CM | POA: Diagnosis not present

## 2016-08-08 DIAGNOSIS — Z5112 Encounter for antineoplastic immunotherapy: Secondary | ICD-10-CM

## 2016-08-08 DIAGNOSIS — C3411 Malignant neoplasm of upper lobe, right bronchus or lung: Secondary | ICD-10-CM

## 2016-08-08 DIAGNOSIS — E1165 Type 2 diabetes mellitus with hyperglycemia: Secondary | ICD-10-CM | POA: Diagnosis not present

## 2016-08-08 DIAGNOSIS — C171 Malignant neoplasm of jejunum: Secondary | ICD-10-CM | POA: Diagnosis not present

## 2016-08-08 DIAGNOSIS — R5382 Chronic fatigue, unspecified: Secondary | ICD-10-CM

## 2016-08-08 DIAGNOSIS — M542 Cervicalgia: Secondary | ICD-10-CM | POA: Insufficient documentation

## 2016-08-08 DIAGNOSIS — X58XXXA Exposure to other specified factors, initial encounter: Secondary | ICD-10-CM | POA: Insufficient documentation

## 2016-08-08 DIAGNOSIS — S2231XA Fracture of one rib, right side, initial encounter for closed fracture: Secondary | ICD-10-CM | POA: Insufficient documentation

## 2016-08-08 DIAGNOSIS — C784 Secondary malignant neoplasm of small intestine: Secondary | ICD-10-CM

## 2016-08-08 DIAGNOSIS — I700 Atherosclerosis of aorta: Secondary | ICD-10-CM | POA: Diagnosis not present

## 2016-08-08 DIAGNOSIS — R634 Abnormal weight loss: Secondary | ICD-10-CM | POA: Diagnosis not present

## 2016-08-08 DIAGNOSIS — I10 Essential (primary) hypertension: Secondary | ICD-10-CM | POA: Diagnosis not present

## 2016-08-08 DIAGNOSIS — R600 Localized edema: Secondary | ICD-10-CM | POA: Insufficient documentation

## 2016-08-08 DIAGNOSIS — I2510 Atherosclerotic heart disease of native coronary artery without angina pectoris: Secondary | ICD-10-CM | POA: Diagnosis not present

## 2016-08-08 DIAGNOSIS — C179 Malignant neoplasm of small intestine, unspecified: Secondary | ICD-10-CM | POA: Diagnosis not present

## 2016-08-08 DIAGNOSIS — C3491 Malignant neoplasm of unspecified part of right bronchus or lung: Secondary | ICD-10-CM

## 2016-08-08 DIAGNOSIS — R630 Anorexia: Secondary | ICD-10-CM | POA: Diagnosis not present

## 2016-08-08 DIAGNOSIS — C3490 Malignant neoplasm of unspecified part of unspecified bronchus or lung: Secondary | ICD-10-CM | POA: Diagnosis not present

## 2016-08-08 DIAGNOSIS — M5136 Other intervertebral disc degeneration, lumbar region: Secondary | ICD-10-CM | POA: Insufficient documentation

## 2016-08-08 LAB — COMPREHENSIVE METABOLIC PANEL
ALT: 70 U/L — ABNORMAL HIGH (ref 0–55)
AST: 70 U/L — ABNORMAL HIGH (ref 5–34)
Albumin: 3.4 g/dL — ABNORMAL LOW (ref 3.5–5.0)
Alkaline Phosphatase: 79 U/L (ref 40–150)
Anion Gap: 11 mEq/L (ref 3–11)
BUN: 9.5 mg/dL (ref 7.0–26.0)
CO2: 25 mEq/L (ref 22–29)
Calcium: 9.4 mg/dL (ref 8.4–10.4)
Chloride: 97 mEq/L — ABNORMAL LOW (ref 98–109)
Creatinine: 0.8 mg/dL (ref 0.7–1.3)
EGFR: >90 mL/min/{1.73_m2} (ref >90)
Glucose: 416 mg/dl — ABNORMAL HIGH (ref 70–140)
Potassium: 4.4 mEq/L (ref 3.5–5.1)
Sodium: 134 mEq/L — ABNORMAL LOW (ref 136–145)
Total Bilirubin: 0.28 mg/dL (ref 0.20–1.20)
Total Protein: 7.2 g/dL (ref 6.4–8.3)

## 2016-08-08 LAB — CBC WITH DIFFERENTIAL/PLATELET
BASO%: 0.6 % (ref 0.0–2.0)
Basophils Absolute: 0 10*3/uL (ref 0.0–0.1)
EOS%: 0 % (ref 0.0–7.0)
Eosinophils Absolute: 0 10*3/uL (ref 0.0–0.5)
HCT: 42.1 % (ref 38.4–49.9)
HGB: 13.6 g/dL (ref 13.0–17.1)
LYMPH%: 8.4 % — ABNORMAL LOW (ref 14.0–49.0)
MCH: 27.4 pg (ref 27.2–33.4)
MCHC: 32.2 g/dL (ref 32.0–36.0)
MCV: 85 fL (ref 79.3–98.0)
MONO#: 0.1 10*3/uL (ref 0.1–0.9)
MONO%: 1.9 % (ref 0.0–14.0)
NEUT#: 6.1 10*3/uL (ref 1.5–6.5)
NEUT%: 89.1 % — ABNORMAL HIGH (ref 39.0–75.0)
Platelets: 288 10*3/uL (ref 140–400)
RBC: 4.95 10*6/uL (ref 4.20–5.82)
RDW: 18.4 % — ABNORMAL HIGH (ref 11.0–14.6)
WBC: 6.9 10*3/uL (ref 4.0–10.3)
lymph#: 0.6 10*3/uL — ABNORMAL LOW (ref 0.9–3.3)

## 2016-08-08 LAB — WHOLE BLOOD GLUCOSE: Glucose: 398 mg/dL — ABNORMAL HIGH (ref 70–100)

## 2016-08-08 LAB — TSH: TSH: 0.864 m(IU)/L (ref 0.320–4.118)

## 2016-08-08 MED ORDER — INSULIN REGULAR HUMAN 100 UNIT/ML IJ SOLN
15.0000 [IU] | Freq: Once | INTRAMUSCULAR | Status: AC
  Administered 2016-08-08: 15 [IU] via SUBCUTANEOUS
  Filled 2016-08-08: qty 0.15

## 2016-08-08 MED ORDER — IOPAMIDOL (ISOVUE-300) INJECTION 61%
100.0000 mL | Freq: Once | INTRAVENOUS | Status: AC | PRN
  Administered 2016-08-08: 100 mL via INTRAVENOUS

## 2016-08-08 MED ORDER — SODIUM CHLORIDE 0.9 % IV SOLN
Freq: Once | INTRAVENOUS | Status: AC
  Administered 2016-08-08: 13:00:00 via INTRAVENOUS

## 2016-08-08 MED ORDER — PEMBROLIZUMAB CHEMO INJECTION 100 MG/4ML
200.0000 mg | Freq: Once | INTRAVENOUS | Status: AC
  Administered 2016-08-08: 200 mg via INTRAVENOUS
  Filled 2016-08-08: qty 8

## 2016-08-08 MED ORDER — HEPARIN SOD (PORK) LOCK FLUSH 100 UNIT/ML IV SOLN
500.0000 [IU] | Freq: Once | INTRAVENOUS | Status: AC
  Administered 2016-08-08: 500 [IU] via INTRAVENOUS

## 2016-08-08 MED ORDER — HEPARIN SOD (PORK) LOCK FLUSH 100 UNIT/ML IV SOLN
INTRAVENOUS | Status: AC
  Administered 2016-08-08: 500 [IU] via INTRAVENOUS
  Filled 2016-08-08: qty 5

## 2016-08-08 MED ORDER — SODIUM CHLORIDE 0.9% FLUSH
10.0000 mL | INTRAVENOUS | Status: DC | PRN
  Administered 2016-08-08: 10 mL
  Filled 2016-08-08: qty 10

## 2016-08-08 MED ORDER — HEPARIN SOD (PORK) LOCK FLUSH 100 UNIT/ML IV SOLN
500.0000 [IU] | Freq: Once | INTRAVENOUS | Status: AC | PRN
  Administered 2016-08-08: 500 [IU]
  Filled 2016-08-08: qty 5

## 2016-08-08 MED ORDER — IOPAMIDOL (ISOVUE-300) INJECTION 61%
INTRAVENOUS | Status: AC
  Administered 2016-08-08: 100 mL via INTRAVENOUS
  Filled 2016-08-08: qty 100

## 2016-08-08 NOTE — Progress Notes (Signed)
Nutrition follow-up completed with patient in the infusion area treated for lung cancer. Patient reports he developed dysphasia after a fall. He reports that has improved and he is chewing and swallowing much better. Weight documented as 109.7 pounds July 19, down from 129 pounds in April 2018 Glucose noted to be 334, and albumin 2.6. Patient reports he has not taken his diabetic medication secondary to tests. He has also had to take steroids. Both of these accounting for increased CBGs. Patient enjoys Carnation breakfast essentials for his oral nutrition supplement.  Nutrition diagnosis:  Food and nutrition related knowledge deficit improved.  Intervention: Patient educated to consume high-calorie, high-protein foods in small frequent meals and snacks. Provided patient with fortified milk recipe. Recommended patient makes 8 ounces fortified milk and mix with one Carnation breakfast packet to provide 350 cal and 20 g protein, encouraged patient to drink frequently throughout the day. Provided high-calorie, high-protein fact sheet along with contact information. Questions were answered.  Teach back method used.  Contact information given.  Monitoring, evaluation, goals: Patient will tolerate increased calories and protein to promote weight gain.  Next visit: Thursday August 9, during infusion.  **Disclaimer: This note was dictated with voice recognition software. Similar sounding words can inadvertently be transcribed and this note may contain transcription errors which may not have been corrected upon publication of note.**

## 2016-08-08 NOTE — Telephone Encounter (Signed)
Scheduled appt per 7/19 los - Gave patient AVS and calender per los.

## 2016-08-08 NOTE — Progress Notes (Signed)
[facility] [phone]   Fax:([phone]) [phone]  OFFICE PROGRESS NOTE  [name], MD [address] [place] [address]  DIAGNOSIS: Stage IV (T1b, N0, M1b) non-small cell lung cancer, adenocarcinoma presented with right upper lobe lung nodule and recent metastasis to the small intestine. This was initially diagnosed in September 2017.  Genomic Alterations Identified? ERBB2 amplification - equivocal? CDKN2A p16INK4a E88* and p14ARF W119J SMARCA4 splice site [id number] SPTA1 E2022* TOP2A amplification TP53 A159P Additional Findings? Microsatellite status MS-Stable Tumor Mutation Burden TMB-Intermediate; 18 Muts/Mb Additional Disease-relevant Genes with No Reportable Alterations Identified? EGFR KRAS ALK BRAF MET RET ROS1   PRIOR THERAPY:  1) Status post right VATS with right upper lobectomy and mediastinal lymph node dissection under the care of Dr. Roxan Hockey on 10/18/2015 and the final pathology was consistent with stage IA (T1b, N0, MX). 2) upper endoscopy on 01/05/2016 showed normal esophagus, normal stomach but there was occasional mass around 3.0 CM in length circumferential nonobstructing in the jejunum. The final pathology was consistent with metastatic adenocarcinoma. 3) status post laparoscopic laparotomy and resection of proximal lesion and and distal jejunum/proximal ileum under the care of Dr. Hassell Done 1 02/27/2016. 3)  Systemic chemotherapy with carboplatin for AUC of 5 and Alimta 500 MG/M2 every 3 weeks. First dose 04/04/2016. Status post 2 cycles. Last dose was given 04/21/2016 discontinued secondary to disease progression.   CURRENT THERAPY: Second line immunotherapy with Ketruda 200 mg IV every 2 weeks, first dose 05/30/2016. Status post 3 cycles.  INTERVAL HISTORY: Brett [name] 56 y.o. male returns to the clinic today for follow-up visit accompanied by his wife. The patient is feeling fine today with no specific  complaints. He has a recent fall at home when he was trying to break his cell phone from the floor. He denied having any headache or visual changes. He denied having any chest pain, shortness of breath, cough or hemoptysis. He has no fever or chills. He denied having any nausea, vomiting, diarrhea or constipation. His weight has been stable over the last few weeks. The patient has been tolerating his treatment with immunotherapy with Nat Math fairly well. He had repeat CT scan of the neck, chest, abdomen and pelvis performed earlier today and he is here for evaluation and discussion of his scan results and treatment options.  MEDICAL HISTORY: Past Medical History:  Diagnosis Date  . Anemia 11/28/2015  . Anxiety   . Arthritis    "hands, back" (11/28/2015)  . Barrett's esophagus 07/01/2013   Without dysplasia on biopsy 09/03/2012. Repeat EGD recommended 08/2015  . Carotid artery stenosis 07/01/2013   Requiring right sided stent   . Chronic pain syndrome 07/01/2013  . Closed head injury with brief loss of consciousness ([place]) 07/22/2010   Head trapped in a hydraulic device at work.  Fracture of orbital bones on right and brief loss of consciousness per report.  . 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 There was likely a strong psycho-social overlay in regard to the cognitive deficits in the form of mood disorder with psychotic features and mixed anxiety symptomatology. His primary tested cognitive deficits are in the areas of attention, executi  . Daily headache "since 07/2010"   constantly  . Degenerative joint disease of cervical spine 07/01/2013  . Dehydration 04/11/2016  . Dupuytren's contracture of both hands 04/08/2014  . Encounter for antineoplastic  chemotherapy 10/05/2015  . Encounter for antineoplastic immunotherapy 05/24/2016  . Erectile dysfunction  associated with type 2 diabetes mellitus ([place]) 07/01/2013  . Fibromyalgia 07/01/2013  . Goals of care, counseling/discussion 03/28/2016  . History of blood transfusion 11/28/2015   "suppose to get his first today" (11/28/2015)  . Hyperlipidemia LDL goal < 100 07/01/2013  . Intractable hiccups 04/11/2016  . 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 Memory changes    "memory issues" from head injury  . Osteoarthritis of right thumb 10/21/2014  . Peripheral vascular occlusive disease ([place]) 07/01/2013   Requiring 2 arterial stents above the left knee per report  . Pneumonia ~ 2006/2007  . Post traumatic stress disorder 07/01/2013  . Primary lung adenocarcinoma ([place]) dx'd 08/2015   "right lung"  . Severe major depression with psychotic features ([place]) 04/15/2011  . Tobacco abuse 07/01/2013  . Tobacco abuse   . Type 2 diabetes mellitus with vascular disease ([place]) 07/01/2013   Left lower extremity and right carotid stenting    ALLERGIES:  is allergic to gabapentin; lyrica [pregabalin]; celebrex [celecoxib]; and contrast media [iodinated diagnostic agents].  MEDICATIONS:  Current Outpatient Prescriptions  Medication Sig Dispense Refill  . aspirin EC 81 MG tablet Take 81 mg by mouth daily.     Marland Kitchen atorvastatin (LIPITOR) 40 MG tablet Take 1 tablet (40 mg total) by mouth daily. 90 tablet 3  . clopidogrel (PLAVIX) 75 MG tablet Take 75 mg by mouth at bedtime.     . diphenhydrAMINE (BENADRYL) 25 mg capsule Take 50 mg by mouth as directed. Take prior to CT scan as directed    . Ferrous Sulfate (IRON) 325 (65 Fe) MG TABS Take 325 mg by mouth 2 (two) times daily with a meal. 120 each 0  . lidocaine-prilocaine (EMLA) cream Apply 1 application topically as needed. 30 g 0  . lisinopril (PRINIVIL,ZESTRIL) 5 MG tablet Take 0.5 tablets (2.5 mg total) by mouth daily. 90 tablet 3  . metoCLOPramide (REGLAN) 10 MG tablet TAKE 1 TABLET BY MOUTH FOUR TIMES DAILY AS NEEDED AS DIRECTED 30 tablet 0  . pantoprazole (PROTONIX) 40  MG tablet Take 1 tablet (40 mg total) by mouth daily. 90 tablet 3  . senna-docusate (SENOKOT-S) 8.6-50 MG tablet Take 1-2 tablets by mouth 2 (two) times daily as needed for mild constipation or moderate constipation. 30 tablet 1  . sitaGLIPtin (JANUVIA) 100 MG tablet Take 1 tablet (100 mg total) by mouth daily. 90 tablet 3  . sorbitol 70 % solution Take 15 cc every 4 hours until bowel movement 473 mL 0  . dronabinol (MARINOL) 2.5 MG capsule Take 1 capsule (2.5 mg total) by mouth 2 (two) times daily before a meal. (Patient not taking: Reported on 08/08/2016) 60 capsule 0  . HYDROcodone-acetaminophen (NORCO) 10-325 MG tablet Take 1 tablet by mouth every 8 (eight) hours as needed for severe pain. 90 tablet 0  . metFORMIN (GLUCOPHAGE) 500 MG tablet Take 1 tablet (500 mg total) by mouth 2 (two) times daily with a meal. (Patient not taking: Reported on 06/21/2016) 180 tablet 3  . ondansetron (ZOFRAN) 4 MG tablet Take 1 tablet (4 mg total) by mouth 3 (three) times daily. (Patient not taking: Reported on 06/21/2016) 20 tablet 0  . oxyCODONE-acetaminophen (PERCOCET) 10-325 MG tablet Take 1 tablet by mouth every 4 (four) hours as needed for pain. 180 tablet 0  . predniSONE (DELTASONE) 50 MG tablet Take 50 mg by mouth as directed. Take as directed before CT scan  0  .  prochlorperazine (COMPAZINE) 10 MG tablet Take 1 tablet (10 mg total) by mouth every 6 (six) hours as needed for nausea or vomiting. (Patient not taking: Reported on 06/21/2016) 30 tablet 0  . prochlorperazine (COMPAZINE) 25 MG suppository Place 1 suppository (25 mg total) rectally every 12 (twelve) hours as needed for refractory nausea / vomiting. (Patient not taking: Reported on 06/27/2016) 12 suppository 11   No current facility-administered medications for this visit.     SURGICAL HISTORY:  Past Surgical History:  Procedure Laterality Date  . CAROTID STENT Right ?2014  . COLONOSCOPY N/A 11/30/2015   Procedure: COLONOSCOPY;  Surgeon: Teena Irani, MD;   Location: [facility] ENDOSCOPY;  Service: Endoscopy;  Laterality: N/A;  . ESOPHAGOGASTRODUODENOSCOPY N/A 11/30/2015   Procedure: ESOPHAGOGASTRODUODENOSCOPY (EGD);  Surgeon: Teena Irani, MD;  Location: [facility] ENDOSCOPY;  Service: Endoscopy;  Laterality: N/A;  . ESOPHAGOGASTRODUODENOSCOPY (EGD) WITH PROPOFOL N/A 01/05/2016   Procedure: ESOPHAGOGASTRODUODENOSCOPY (EGD) WITH PROPOFOL;  Surgeon: Teena Irani, MD;  Location: [facility] ENDOSCOPY;  Service: Endoscopy;  Laterality: N/A;  . FEMORAL ARTERY STENT Left 05/2012; ~ 2015   Archie Endo 06/04/2012; Raechel Chute report  . FRACTURE SURGERY    . GIVENS CAPSULE STUDY N/A 12/22/2015   Procedure: GIVENS CAPSULE STUDY;  Surgeon: Wonda Horner, MD;  Location: [facility] ENDOSCOPY;  Service: Endoscopy;  Laterality: N/A;  . HARDWARE REMOVAL Right 11/15/2011   Removal of deep frontozygomatic orbital hardware/notes 11/15/2011  . HERNIA REPAIR  [id number]   Umbilical  . LAPAROSCOPY N/A 02/27/2016   Procedure: LAPAROSCOPY, LAPAROTOMY  WITH TWO SMALL BOWEL RESECTION;  Surgeon: Johnathan Hausen, MD;  Location: [facility] ORS;  Service: General;  Laterality: N/A;  . ORIF ORBITAL FRACTURE Right 08/15/2010    caught in a hydraulic machine; open reduction internal fixation of orbital rim fracture and open reduction of zygomatic arch fracture  Archie Endo 10/13/2009  . PORTACATH PLACEMENT Left 04/04/2016   Procedure: INSERTION PORT-A-CATH LEFT CHEST;  Surgeon: Melrose Nakayama, MD;  Location: [facility] [place];  Service: Thoracic;  Laterality: Left;  Marland Kitchen VIDEO ASSISTED THORACOSCOPY (VATS)/ LOBECTOMY Right 10/18/2015   Procedure: VIDEO ASSISTED THORACOSCOPY (VATS)/ LOBECTOMY;  Surgeon: Melrose Nakayama, MD;  Location: [place];  Service: Thoracic;  Laterality: Right;  Marland Kitchen VIDEO BRONCHOSCOPY Bilateral 09/21/2015   Procedure: VIDEO BRONCHOSCOPY WITH FLUORO;  Surgeon: Juanito Doom, MD;  Location: [facility] ENDOSCOPY;  Service: Cardiopulmonary;  Laterality: Bilateral;    REVIEW OF SYSTEMS:  Constitutional: positive for fatigue Eyes:  negative Ears, nose, mouth, throat, and face: negative Respiratory: negative Cardiovascular: negative Gastrointestinal: negative Genitourinary:negative Integument/breast: negative Hematologic/lymphatic: negative Musculoskeletal:negative Neurological: negative Behavioral/Psych: negative Endocrine: negative Allergic/Immunologic: negative   PHYSICAL EXAMINATION: General appearance: alert, cooperative, fatigued and no distress Head: Normocephalic, without obvious abnormality, atraumatic Neck: no adenopathy, no JVD, supple, symmetrical, trachea midline and thyroid not enlarged, symmetric, no tenderness/mass/nodules Lymph nodes: Cervical, supraclavicular, and axillary nodes normal. Resp: clear to auscultation bilaterally Back: symmetric, no curvature. ROM normal. No CVA tenderness. Cardio: Tachycardia GI: soft, non-tender; bowel sounds normal; no masses,  no organomegaly Extremities: extremities normal, atraumatic, no cyanosis or edema Neurologic: Alert and oriented X 3, normal strength and tone. Normal symmetric reflexes. Normal coordination and gait  ECOG PERFORMANCE STATUS: 1 - Symptomatic but completely ambulatory  Blood pressure 124/78, pulse (!) 102, temperature 97.9 F (36.6 C), temperature source Oral, resp. rate 18, height _0  (1.702 m), weight 109 lb 11.2 oz (49.8 kg), SpO2 100 %.  LABORATORY DATA: Lab Results  Component Value Date   WBC 6.9 08/08/2016   HGB 13.6  08/08/2016   HCT 42.1 08/08/2016   MCV 85.0 08/08/2016   PLT 288 08/08/2016      Chemistry      Component Value Date/Time   NA 134 (L) 08/08/2016 1027   K 4.4 08/08/2016 1027   CL 103 04/04/2016 1206   CO2 25 08/08/2016 1027   BUN 9.5 08/08/2016 1027   CREATININE 0.8 08/08/2016 1027      Component Value Date/Time   CALCIUM 9.4 08/08/2016 1027   ALKPHOS 79 08/08/2016 1027   AST 70 (H) 08/08/2016 1027   ALT 70 (H) 08/08/2016 1027   BILITOT 0.28 08/08/2016 1027       RADIOGRAPHIC STUDIES: Ct  Soft Tissue Neck W Contrast  Result Date: 08/08/2016 CLINICAL DATA:  56 year old male with neck pain and dysphagia after a fall 2 weeks ago. Metastatic lung cancer, chemotherapy ongoing. EXAM: CT NECK WITH CONTRAST TECHNIQUE: Multidetector CT imaging of the neck was performed using the standard protocol following the bolus administration of intravenous contrast. CONTRAST:  100 mL Isovue 370 in conjunction with contrast enhanced imaging of the chest, abdomen, and pelvis Reported separately. COMPARISON:  brain MRI 04/03/2016.  PET-CT 10/03/2015 FINDINGS: Pharynx and larynx: Negative larynx and pharynx. Negative parapharyngeal and retropharyngeal spaces. Negative cervical esophagus. Salivary glands: Negative sublingual space and submandibular glands. Negative parotid glands. Thyroid: Negative. Lymph nodes: Bilateral cervical lymph nodes are small and appear normal. Several small lymph nodes at the right thoracic inlet up to 6 mm short axis appear not significantly changed since the 2017 PET-CT (series 5, image 97). Vascular: Major vascular structures in the neck and at the skullbase are patent. There is a patent right carotid bifurcation stent extending into the right ICA. Left subclavian approach porta cath. Limited intracranial: Negative. Visualized orbits: Negative. Mastoids and visualized paranasal sinuses: Visualized paranasal sinuses and mastoids are stable and well pneumatized. Skeleton: Absent dentition. Visualized skull base is intact. No atlanto-occipital dissociation. Cervicothoracic junction alignment is within normal limits. Bilateral posterior element alignment is within normal limits. No cervical spine fracture. Trace anterolisthesis of C3 on C4 with associated right greater than left chronic facet degeneration. No acute or suspicious osseous lesion in the neck. Upper chest: Chest CT findings are reported separately today with the abdomen and pelvis. IMPRESSION: 1. Negative neck CT. No explanation for  dysphagia, and no metastatic disease identified in the neck. 2. No recent traumatic injury identified. Mild cervical spine degeneration. 3. CT chest, abdomen, and pelvis today are reported separately. Electronically Signed   By: Genevie Ann M.D.   On: 08/08/2016 10:23   Ct Chest W Contrast  Result Date: 08/08/2016 CLINICAL DATA:  Fall 2 weeks ago. Posterior neck pain. Metastatic lung cancer to small bowel, ongoing chemotherapy. EXAM: CT CHEST, ABDOMEN, AND PELVIS WITH CONTRAST TECHNIQUE: Multidetector CT imaging of the chest, abdomen and pelvis was performed following the standard protocol during bolus administration of intravenous contrast. CONTRAST:  100 cc Isovue 370 COMPARISON:  04/03/2016 FINDINGS: CT CHEST FINDINGS Cardiovascular: Coronary, aortic arch, and branch vessel atherosclerotic vascular disease. Left Port-A-Cath tip: SVC. Mediastinum/Nodes: Unremarkable Lungs/Pleura: Right upper lobectomy. No significant lung nodule or recurrence in the lung parenchymal identified. Musculoskeletal: There is a new, healing right anterior fifth rib fracture shown on image 99/7. Lower thoracic spondylosis noted. CT ABDOMEN PELVIS FINDINGS Hepatobiliary: Mildly contracted gallbladder. The liver appears unremarkable. Pancreas: No significant abnormality. Slight prominence of posterior pancreatic head parenchyma for example on image 68/3 is chronically stable and was not hypermetabolic on prior PET-CT of 10/03/2015,  hence likely normal variant. Spleen: Unremarkable Adrenals/Urinary Tract: Unremarkable Stomach/Bowel: Contrast medium extends up into the distal esophagus and probably reflects reflux. Postoperative findings along right lower quadrant small bowel. Borderline prominence of stool in the colon. Vascular/Lymphatic: Aortoiliac atherosclerotic vascular disease. Indistinctly marginated and potentially centrally necrotic left periaortic lymph node measures 1.2 cm in short axis on image 72/3, formerly 1.4 cm. However,  several other previously enlarged lymph nodes are significantly improved, including a 0.7 cm indistinctly marginated lymph node on image 67/3 which previously measured 1.3 cm in short axis, as well as other retroperitoneal lymph nodes. The overall appearance constitutes improvement. There is mesenteric stranding and in indistinctly marginated mesenteric lymph node measures 0.8 cm in short axis on image 71/3, formerly 1.4 cm. Reproductive: Dense calcification in the right central portion of the prostate gland. Other: No supplemental non-categorized findings. Musculoskeletal: Lumbar spondylosis and degenerative disc disease resulting in mild bilateral foraminal impingement at L4-5. IMPRESSION: 1. Overall improved compared to prior, with reduced retroperitoneal and mesenteric adenopathy. There is some accentuated low-level stranding in the central mesentery which is likely treatment related. The adenopathy is not completely resolved, and there is a persistent 1.2 cm potentially centrally necrotic left periaortic lymph node on image 72/3. 2. No new metastatic lesions are identified. Postoperative findings in the right lower quadrant small bowel. 3. Probable gastroesophageal reflux. 4.  Aortic Atherosclerosis (ICD10-I70.0).  Coronary atherosclerosis. 5. There is a new healing (subacute) right anterior fifth rib fracture which was not present on 04/03/2016. This is most likely benign based on appearance. 6. Mild bilateral foraminal impingement at L4-5 due to spondylosis and degenerative disc disease. Electronically Signed   By: Van Clines M.D.   On: 08/08/2016 12:01   Ct Abdomen Pelvis W Contrast  Result Date: 08/08/2016 CLINICAL DATA:  Fall 2 weeks ago. Posterior neck pain. Metastatic lung cancer to small bowel, ongoing chemotherapy. EXAM: CT CHEST, ABDOMEN, AND PELVIS WITH CONTRAST TECHNIQUE: Multidetector CT imaging of the chest, abdomen and pelvis was performed following the standard protocol during bolus  administration of intravenous contrast. CONTRAST:  100 cc Isovue 370 COMPARISON:  04/03/2016 FINDINGS: CT CHEST FINDINGS Cardiovascular: Coronary, aortic arch, and branch vessel atherosclerotic vascular disease. Left Port-A-Cath tip: SVC. Mediastinum/Nodes: Unremarkable Lungs/Pleura: Right upper lobectomy. No significant lung nodule or recurrence in the lung parenchymal identified. Musculoskeletal: There is a new, healing right anterior fifth rib fracture shown on image 99/7. Lower thoracic spondylosis noted. CT ABDOMEN PELVIS FINDINGS Hepatobiliary: Mildly contracted gallbladder. The liver appears unremarkable. Pancreas: No significant abnormality. Slight prominence of posterior pancreatic head parenchyma for example on image 68/3 is chronically stable and was not hypermetabolic on prior PET-CT of 10/03/2015, hence likely normal variant. Spleen: Unremarkable Adrenals/Urinary Tract: Unremarkable Stomach/Bowel: Contrast medium extends up into the distal esophagus and probably reflects reflux. Postoperative findings along right lower quadrant small bowel. Borderline prominence of stool in the colon. Vascular/Lymphatic: Aortoiliac atherosclerotic vascular disease. Indistinctly marginated and potentially centrally necrotic left periaortic lymph node measures 1.2 cm in short axis on image 72/3, formerly 1.4 cm. However, several other previously enlarged lymph nodes are significantly improved, including a 0.7 cm indistinctly marginated lymph node on image 67/3 which previously measured 1.3 cm in short axis, as well as other retroperitoneal lymph nodes. The overall appearance constitutes improvement. There is mesenteric stranding and in indistinctly marginated mesenteric lymph node measures 0.8 cm in short axis on image 71/3, formerly 1.4 cm. Reproductive: Dense calcification in the right central portion of the prostate gland. Other: No supplemental  non-categorized findings. Musculoskeletal: Lumbar spondylosis and  degenerative disc disease resulting in mild bilateral foraminal impingement at L4-5. IMPRESSION: 1. Overall improved compared to prior, with reduced retroperitoneal and mesenteric adenopathy. There is some accentuated low-level stranding in the central mesentery which is likely treatment related. The adenopathy is not completely resolved, and there is a persistent 1.2 cm potentially centrally necrotic left periaortic lymph node on image 72/3. 2. No new metastatic lesions are identified. Postoperative findings in the right lower quadrant small bowel. 3. Probable gastroesophageal reflux. 4.  Aortic Atherosclerosis (ICD10-I70.0).  Coronary atherosclerosis. 5. There is a new healing (subacute) right anterior fifth rib fracture which was not present on 04/03/2016. This is most likely benign based on appearance. 6. Mild bilateral foraminal impingement at L4-5 due to spondylosis and degenerative disc disease. Electronically Signed   By: Van Clines M.D.   On: 08/08/2016 12:01     ASSESSMENT AND PLAN:  This is a very pleasant 56 years old white male with metastatic non-small cell lung cancer, adenocarcinoma status post right upper lobectomy with lymph node dissection. Unfortunately the patient was found to have metastatic disease in the jejunum and terminal ileum.  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 The patient was started on treatment with systemic chemotherapy with carboplatin and Alimta for 2 cycles discontinued secondary to intolerance and disease progression. The patient is currently undergoing treatment with second line immunotherapy with Ketruda (pembrolizumab) 200 mg IV every 3 weeks, status post 3 cycles and has been tolerating this treatment well. He had repeat CT scan of the neck, chest, abdomen and pelvis performed recently. I personally and independently  reviewed the scan images and discuss the results with the patient and his wife. Fortunately his scan showed no evidence for disease progression and there was some improvement in the peritoneal lymphadenopathy and metastasis. I recommended for the patient to continue his current treatment with [place] (pembrolizumab) with the same dose and he will receive cycle #4 today. For the lack of appetite and weight loss, he will continue his current treatment with Marinol 2.5 mg by mouth twice a day. For diabetes mellitus and hyperglycemia, I will give the patient a dose of regular insulin 15 units today and he was advised to take his medication as prescribed and follow-up with his primary care physician for adjustment if needed. I will see the patient back for follow-up visit in 3 weeks for evaluation with the next cycle of his treatment. He was advised to call immediately if he has any concerning symptoms in the interval. The patient voices understanding of current disease status and treatment options and is in agreement with the current care plan. All questions were answered. The patient knows to call the clinic with any problems, questions or concerns. We can certainly see the patient much sooner if necessary.  Disclaimer: This note was dictated with voice recognition software. Similar sounding words can inadvertently be transcribed and may not be corrected upon review.

## 2016-08-08 NOTE — Patient Instructions (Signed)
[facility] Discharge Instructions for Patients Receiving Chemotherapy  Today you received the following chemotherapy agents Keytruda   To help prevent nausea and vomiting after your treatment, we encourage you to take your nausea medication as directed.   If you develop nausea and vomiting that is not controlled by your nausea medication, call the clinic.   BELOW ARE SYMPTOMS THAT SHOULD BE REPORTED IMMEDIATELY:  *FEVER GREATER THAN 100.5 F  *CHILLS WITH OR WITHOUT FEVER  NAUSEA AND VOMITING THAT IS NOT CONTROLLED WITH YOUR NAUSEA MEDICATION  *UNUSUAL SHORTNESS OF BREATH  *UNUSUAL BRUISING OR BLEEDING  TENDERNESS IN MOUTH AND THROAT WITH OR WITHOUT PRESENCE OF ULCERS  *URINARY PROBLEMS  *BOWEL PROBLEMS  UNUSUAL RASH Items with * indicate a potential emergency and should be followed up as soon as possible.  Feel free to call the clinic you have any questions or concerns. The clinic phone number is [phone].  Please show the [facility] at check-in to the Emergency Department and triage nurse.    Hyperglycemia (HIGH BLOOD SUGAR) Hyperglycemia is when the sugar (glucose) level in your blood is too high. It may not cause symptoms. If you do have symptoms, they may include warning signs, such as:  Feeling more thirsty than normal.  Hunger.  Feeling tired.  Needing to pee (urinate) more than normal.  Blurry eyesight (vision).  You may get other symptoms as it gets worse, such as:  Dry mouth.  Not being hungry (loss of appetite).  Fruity-smelling breath.  Weakness.  Weight gain or loss that is not planned. Weight loss may be fast.  A tingling or numb feeling in your hands or feet.  Headache.  Skin that does not bounce back quickly when it is lightly pinched and released (poor skin turgor).  Pain in your belly (abdomen).  Cuts or bruises that heal slowly.  High blood sugar can happen to people who do or do not have diabetes.  High blood sugar can happen slowly or quickly, and it can be an emergency. Follow these instructions at home: General instructions  Take over-the-counter and prescription medicines only as told by your doctor.  Do not use products that contain nicotine or tobacco, such as cigarettes and e-cigarettes. If you need help quitting, ask your doctor.  Limit alcohol intake to no more than 1 drink per day for nonpregnant women and 2 drinks per day for men. One drink equals 12 oz of beer, 5 oz of wine, or 1 oz of hard liquor.  Manage stress. If you need help with this, ask your doctor.  Keep all follow-up visits as told by your doctor. This is important. Eating and drinking  Stay at a healthy weight.  Exercise regularly, as told by your doctor.  Drink enough fluid, especially when you: ? Exercise. ? Get sick. ? Are in hot temperatures.  Eat healthy foods, such as: ? Low-fat (lean) proteins. ? Complex carbs (complex carbohydrates), such as whole wheat bread or brown rice. ? Fresh fruits and vegetables. ? Low-fat dairy products. ? Healthy fats.  Drink enough fluid to keep your pee (urine) clear or pale yellow. If you have diabetes:  Make sure you know the symptoms of hyperglycemia.  Follow your diabetes management plan, as told by your doctor. Make sure you: ? Take insulin and medicines as told. ? Follow your exercise plan. ? Follow your meal plan. Eat on time. Do not skip meals. ? Check your blood sugar as often as told. Make  sure to check before and after exercise. If you exercise longer or in a different way than you normally do, check your blood sugar more often. ? Follow your sick day plan whenever you cannot eat or drink normally. Make this plan ahead of time with your doctor.  Share your diabetes management plan with people in your workplace, school, and household.  Check your urine for ketones when you are ill and as told by your doctor.  Carry a card or wear jewelry that  says that you have diabetes. Contact a doctor if:  Your blood sugar level is higher than 240 mg/dL (13.3 mmol/L) for 2 days in a row.  You have problems keeping your blood sugar in your target range.  High blood sugar happens often for you. Get help right away if:  You have trouble breathing.  You have a change in how you think, feel, or act (mental status).  You feel sick to your stomach (nauseous), and that feeling does not go away.  You cannot stop throwing up (vomiting). These symptoms may be an emergency. Do not wait to see if the symptoms will go away. Get medical help right away. Call your local emergency services (911 in the [place] Do not drive yourself to the hospital. Summary  Hyperglycemia is when the sugar (glucose) level in your blood is too high.  High blood sugar can happen to people who do or do not have diabetes.  Make sure you drink enough fluids, eat healthy foods, and exercise regularly.  Contact your doctor if you have problems keeping your blood sugar in your target range. This information is not intended to replace advice given to you by your health care provider. Make sure you discuss any questions you have with your health care provider. Document Released: 11/04/2008 Document Revised: 09/25/2015 Document Reviewed: 09/25/2015 Elsevier Interactive Patient Education  2017 [employer].

## 2016-08-09 ENCOUNTER — Encounter: Payer: Self-pay | Admitting: Physical Therapy

## 2016-08-13 ENCOUNTER — Ambulatory Visit: Payer: Medicare Other

## 2016-08-29 ENCOUNTER — Encounter: Payer: Self-pay | Admitting: Internal Medicine

## 2016-08-29 ENCOUNTER — Other Ambulatory Visit: Payer: Self-pay | Admitting: Internal Medicine

## 2016-08-29 ENCOUNTER — Other Ambulatory Visit (HOSPITAL_BASED_OUTPATIENT_CLINIC_OR_DEPARTMENT_OTHER): Payer: Medicare Other

## 2016-08-29 ENCOUNTER — Ambulatory Visit (HOSPITAL_BASED_OUTPATIENT_CLINIC_OR_DEPARTMENT_OTHER): Payer: Medicare Other

## 2016-08-29 ENCOUNTER — Ambulatory Visit (HOSPITAL_BASED_OUTPATIENT_CLINIC_OR_DEPARTMENT_OTHER): Payer: Medicare Other | Admitting: Internal Medicine

## 2016-08-29 ENCOUNTER — Ambulatory Visit: Payer: Medicare Other | Admitting: Nutrition

## 2016-08-29 VITALS — BP 121/73 | HR 118 | Temp 97.6°F | Resp 17 | Ht 67.0 in | Wt 110.0 lb

## 2016-08-29 DIAGNOSIS — Z5112 Encounter for antineoplastic immunotherapy: Secondary | ICD-10-CM

## 2016-08-29 DIAGNOSIS — C3411 Malignant neoplasm of upper lobe, right bronchus or lung: Secondary | ICD-10-CM

## 2016-08-29 DIAGNOSIS — C3491 Malignant neoplasm of unspecified part of right bronchus or lung: Secondary | ICD-10-CM

## 2016-08-29 DIAGNOSIS — C784 Secondary malignant neoplasm of small intestine: Secondary | ICD-10-CM | POA: Diagnosis not present

## 2016-08-29 DIAGNOSIS — R5382 Chronic fatigue, unspecified: Secondary | ICD-10-CM

## 2016-08-29 DIAGNOSIS — Z79899 Other long term (current) drug therapy: Secondary | ICD-10-CM

## 2016-08-29 LAB — TSH: TSH: 1.716 m(IU)/L (ref 0.320–4.118)

## 2016-08-29 LAB — CBC WITH DIFFERENTIAL/PLATELET
BASO%: 0.4 % (ref 0.0–2.0)
Basophils Absolute: 0 10*3/uL (ref 0.0–0.1)
EOS%: 3.1 % (ref 0.0–7.0)
Eosinophils Absolute: 0.3 10*3/uL (ref 0.0–0.5)
HCT: 45.9 % (ref 38.4–49.9)
HGB: 14.7 g/dL (ref 13.0–17.1)
LYMPH%: 16.9 % (ref 14.0–49.0)
MCH: 28.1 pg (ref 27.2–33.4)
MCHC: 32 g/dL (ref 32.0–36.0)
MCV: 87.8 fL (ref 79.3–98.0)
MONO#: 0.8 10*3/uL (ref 0.1–0.9)
MONO%: 9.2 % (ref 0.0–14.0)
NEUT#: 5.8 10*3/uL (ref 1.5–6.5)
NEUT%: 70.4 % (ref 39.0–75.0)
Platelets: 261 10*3/uL (ref 140–400)
RBC: 5.23 10*6/uL (ref 4.20–5.82)
RDW: 17.2 % — ABNORMAL HIGH (ref 11.0–14.6)
WBC: 8.3 10*3/uL (ref 4.0–10.3)
lymph#: 1.4 10*3/uL (ref 0.9–3.3)

## 2016-08-29 LAB — COMPREHENSIVE METABOLIC PANEL
ALT: 46 U/L (ref 0–55)
AST: 52 U/L — ABNORMAL HIGH (ref 5–34)
Albumin: 3.4 g/dL — ABNORMAL LOW (ref 3.5–5.0)
Alkaline Phosphatase: 66 U/L (ref 40–150)
Anion Gap: 8 mEq/L (ref 3–11)
BUN: 8.8 mg/dL (ref 7.0–26.0)
CO2: 28 mEq/L (ref 22–29)
Calcium: 9.6 mg/dL (ref 8.4–10.4)
Chloride: 104 mEq/L (ref 98–109)
Creatinine: 0.6 mg/dL — ABNORMAL LOW (ref 0.7–1.3)
EGFR: >90 mL/min/{1.73_m2} (ref >90)
Glucose: 84 mg/dl (ref 70–140)
Potassium: 4.2 mEq/L (ref 3.5–5.1)
Sodium: 141 mEq/L (ref 136–145)
Total Bilirubin: 0.3 mg/dL (ref 0.20–1.20)
Total Protein: 6.8 g/dL (ref 6.4–8.3)

## 2016-08-29 MED ORDER — SODIUM CHLORIDE 0.9% FLUSH
10.0000 mL | INTRAVENOUS | Status: DC | PRN
  Administered 2016-08-29: 10 mL
  Filled 2016-08-29: qty 10

## 2016-08-29 MED ORDER — SODIUM CHLORIDE 0.9 % IV SOLN
Freq: Once | INTRAVENOUS | Status: AC
  Administered 2016-08-29: 12:00:00 via INTRAVENOUS

## 2016-08-29 MED ORDER — HEPARIN SOD (PORK) LOCK FLUSH 100 UNIT/ML IV SOLN
500.0000 [IU] | Freq: Once | INTRAVENOUS | Status: AC | PRN
  Administered 2016-08-29: 500 [IU]
  Filled 2016-08-29: qty 5

## 2016-08-29 MED ORDER — SODIUM CHLORIDE 0.9 % IV SOLN
200.0000 mg | Freq: Once | INTRAVENOUS | Status: AC
  Administered 2016-08-29: 200 mg via INTRAVENOUS
  Filled 2016-08-29: qty 8

## 2016-08-29 NOTE — Progress Notes (Signed)
Nutrition follow-up completed with patient during infusion for lung cancer. Patient reports dysphagia continues.  He describes inability to swallow if he is standing but he can swallow adequately if he is sitting. Weight was documented as 110 pounds August 9 and is relatively stable over the past 3 weeks. Noting glucose 84, which is improved. Patient reports he drinks one Carnation breakfast essentials daily. Patient reports nausea has improved and appetite is better. He denies issues with his bowels.  Nutrition diagnosis: Food and nutrition related knowledge deficit improved.  Intervention: Patient educated to continue Carnation breakfast essentials at least once daily to provide 350 cal and 20 g protein (made with fortified milk). Consider swallow evaluation for evaluation of continued dysphagia. Provided support and encouragement for adequate oral intake and weight gain. Questions were answered.  Teach back method used.  Monitoring, evaluation, goals: Patient will tolerate adequate calories and protein to promote weight gain.  Next visit: Thursday, August 30, during infusion.  **Disclaimer: This note was dictated with voice recognition software. Similar sounding words can inadvertently be transcribed and this note may contain transcription errors which may not have been corrected upon publication of note.**

## 2016-08-29 NOTE — Patient Instructions (Addendum)
[facility] Discharge Instructions for Patients Receiving Chemotherapy  Today you received the following chemotherapy agents: Keytruda   To help prevent nausea and vomiting after your treatment, we encourage you to take your nausea medication as directed    If you develop nausea and vomiting that is not controlled by your nausea medication, call the clinic.   BELOW ARE SYMPTOMS THAT SHOULD BE REPORTED IMMEDIATELY:  *FEVER GREATER THAN 100.5 F  *CHILLS WITH OR WITHOUT FEVER  NAUSEA AND VOMITING THAT IS NOT CONTROLLED WITH YOUR NAUSEA MEDICATION  *UNUSUAL SHORTNESS OF BREATH  *UNUSUAL BRUISING OR BLEEDING  TENDERNESS IN MOUTH AND THROAT WITH OR WITHOUT PRESENCE OF ULCERS  *URINARY PROBLEMS  *BOWEL PROBLEMS  UNUSUAL RASH Items with * indicate a potential emergency and should be followed up as soon as possible.  Feel free to call the clinic you have any questions or concerns. The clinic phone number is [phone].  Please show the CHEMO ALERT CARD at check-in to the Emergency Department and triage nurse.   

## 2016-08-29 NOTE — Progress Notes (Signed)
[facility] [phone]   [phone]  OFFICE PROGRESS NOTE  [name], MD [address] [place] [address]  DIAGNOSIS: Stage IV (T1b, N0, M1b) non-small cell lung cancer, adenocarcinoma presented with right upper lobe lung nodule and recent metastasis to the small intestine. This was initially diagnosed in September 2017.  Genomic Alterations Identified? ERBB2 amplification - equivocal? CDKN2A p16INK4a E88* and p14ARF W263Z SMARCA4 splice site [id number] SPTA1 E2022* TOP2A amplification TP53 A159P Additional Findings? Microsatellite status MS-Stable Tumor Mutation Burden TMB-Intermediate; 18 Muts/Mb Additional Disease-relevant Genes with No Reportable Alterations Identified? EGFR KRAS ALK BRAF MET RET ROS1   PRIOR THERAPY:  1) Status post right VATS with right upper lobectomy and mediastinal lymph node dissection under the care of Dr. Roxan Hockey on 10/18/2015 and the final pathology was consistent with stage IA (T1b, N0, MX). 2) upper endoscopy on 01/05/2016 showed normal esophagus, normal stomach but there was occasional mass around 3.0 CM in length circumferential nonobstructing in the jejunum. The final pathology was consistent with metastatic adenocarcinoma. 3) status post laparoscopic laparotomy and resection of proximal lesion and and distal jejunum/proximal ileum under the care of Dr. Hassell Done 1 02/27/2016. 3)  Systemic chemotherapy with carboplatin for AUC of 5 and Alimta 500 MG/M2 every 3 weeks. First dose 04/04/2016. Status post 2 cycles. Last dose was given 04/21/2016 discontinued secondary to disease progression.   CURRENT THERAPY: Second line immunotherapy with Ketruda 200 mg IV every 2 weeks, first dose 05/30/2016. Status post 4 cycles.  INTERVAL HISTORY: Brett [name] 56 y.o. male returns to the clinic today for follow-up visit accompanied by his wife. The patient is feeling fine today was no specific  complaints. He has one episode of vomiting earlier today. He attributes it to the fascia and shrimp. The previous night. He denied having any chest pain, shortness of breath, cough or hemoptysis. He denied having any fever or chills. He has no nausea, diarrhea or constipation today. I had the patient denied having any significant weight loss or night sweats. He is here today for evaluation before starting cycle #5.   MEDICAL HISTORY: Past Medical History:  Diagnosis Date  . Anemia 11/28/2015  . Anxiety   . Arthritis    "hands, back" (11/28/2015)  . Barrett's esophagus 07/01/2013   Without dysplasia on biopsy 09/03/2012. Repeat EGD recommended 08/2015  . Carotid artery stenosis 07/01/2013   Requiring right sided stent   . Chronic pain syndrome 07/01/2013  . Closed head injury with brief loss of consciousness ([facility]) 07/22/2010   Head trapped in a hydraulic device at work.  Fracture of orbital bones on right and brief loss of consciousness per report.  . 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 There was likely a strong psycho-social overlay in regard to the cognitive deficits in the form of mood disorder with psychotic features and mixed anxiety symptomatology. His primary tested cognitive deficits are in the areas of attention, executi  . Daily headache "since 07/2010"   constantly  . Degenerative joint disease of cervical spine 07/01/2013  . Dehydration 04/11/2016  . Dupuytren's contracture of both hands 04/08/2014  . Encounter for antineoplastic chemotherapy 10/05/2015  . Encounter for antineoplastic immunotherapy 05/24/2016  . Erectile dysfunction associated with type 2 diabetes mellitus ([facility]) 07/01/2013  . Fibromyalgia 07/01/2013  . Goals of care, counseling/discussion 03/28/2016  . History of blood  transfusion 11/28/2015   "suppose to get his first today"  (11/28/2015)  . Hyperlipidemia LDL goal < 100 07/01/2013  . Intractable hiccups 04/11/2016  . 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 Memory changes    "memory issues" from head injury  . Osteoarthritis of right thumb 10/21/2014  . Peripheral vascular occlusive disease ([facility]) 07/01/2013   Requiring 2 arterial stents above the left knee per report  . Pneumonia ~ 2006/2007  . Post traumatic stress disorder 07/01/2013  . Primary lung adenocarcinoma ([facility]) dx'd 08/2015   "right lung"  . Severe major depression with psychotic features ([facility]) 04/15/2011  . Tobacco abuse 07/01/2013  . Tobacco abuse   . Type 2 diabetes mellitus with vascular disease ([facility]) 07/01/2013   Left lower extremity and right carotid stenting    ALLERGIES:  is allergic to gabapentin; lyrica [pregabalin]; celebrex [celecoxib]; and contrast media [iodinated diagnostic agents].  MEDICATIONS:  Current Outpatient Prescriptions  Medication Sig Dispense Refill  . aspirin EC 81 MG tablet Take 81 mg by mouth daily.     Marland Kitchen atorvastatin (LIPITOR) 40 MG tablet Take 1 tablet (40 mg total) by mouth daily. 90 tablet 3  . clopidogrel (PLAVIX) 75 MG tablet Take 75 mg by mouth at bedtime.     . diphenhydrAMINE (BENADRYL) 25 mg capsule Take 50 mg by mouth as directed. Take prior to CT scan as directed    . dronabinol (MARINOL) 2.5 MG capsule Take 1 capsule (2.5 mg total) by mouth 2 (two) times daily before a meal. (Patient not taking: Reported on 08/08/2016) 60 capsule 0  . Ferrous Sulfate (IRON) 325 (65 Fe) MG TABS Take 325 mg by mouth 2 (two) times daily with a meal. 120 each 0  . HYDROcodone-acetaminophen (NORCO) 10-325 MG tablet Take 1 tablet by mouth every 8 (eight) hours as needed for severe pain. 90 tablet 0  . lidocaine-prilocaine (EMLA) cream Apply 1 application topically as needed. 30 g 0  . lisinopril (PRINIVIL,ZESTRIL) 5 MG tablet Take 0.5 tablets (2.5 mg total) by mouth daily. 90 tablet 3  . metFORMIN (GLUCOPHAGE) 500 MG tablet Take 1  tablet (500 mg total) by mouth 2 (two) times daily with a meal. (Patient not taking: Reported on 06/21/2016) 180 tablet 3  . metoCLOPramide (REGLAN) 10 MG tablet TAKE 1 TABLET BY MOUTH FOUR TIMES DAILY AS NEEDED AS DIRECTED 30 tablet 0  . ondansetron (ZOFRAN) 4 MG tablet Take 1 tablet (4 mg total) by mouth 3 (three) times daily. (Patient not taking: Reported on 06/21/2016) 20 tablet 0  . oxyCODONE-acetaminophen (PERCOCET) 10-325 MG tablet Take 1 tablet by mouth every 4 (four) hours as needed for pain. 180 tablet 0  . pantoprazole (PROTONIX) 40 MG tablet Take 1 tablet (40 mg total) by mouth daily. 90 tablet 3  . predniSONE (DELTASONE) 50 MG tablet Take 50 mg by mouth as directed. Take as directed before CT scan  0  . prochlorperazine (COMPAZINE) 10 MG tablet Take 1 tablet (10 mg total) by mouth every 6 (six) hours as needed for nausea or vomiting. (Patient not taking: Reported on 06/21/2016) 30 tablet 0  . prochlorperazine (COMPAZINE) 25 MG suppository Place 1 suppository (25 mg total) rectally every 12 (twelve) hours as needed for refractory nausea / vomiting. (Patient not taking: Reported on 06/27/2016) 12 suppository 11  . senna-docusate (SENOKOT-S) 8.6-50 MG tablet Take 1-2 tablets by mouth 2 (two) times daily as needed for mild constipation or moderate constipation. 30 tablet 1  . sitaGLIPtin (JANUVIA) 100 MG tablet  Take 1 tablet (100 mg total) by mouth daily. 90 tablet 3  . sorbitol 70 % solution Take 15 cc every 4 hours until bowel movement 473 mL 0   No current facility-administered medications for this visit.     SURGICAL HISTORY:  Past Surgical History:  Procedure Laterality Date  . CAROTID STENT Right ?2014  . COLONOSCOPY N/A 11/30/2015   Procedure: COLONOSCOPY;  Surgeon: Teena Irani, MD;  Location: [facility] ENDOSCOPY;  Service: Endoscopy;  Laterality: N/A;  . ESOPHAGOGASTRODUODENOSCOPY N/A 11/30/2015   Procedure: ESOPHAGOGASTRODUODENOSCOPY (EGD);  Surgeon: Teena Irani, MD;  Location: [facility] ENDOSCOPY;   Service: Endoscopy;  Laterality: N/A;  . ESOPHAGOGASTRODUODENOSCOPY (EGD) WITH PROPOFOL N/A 01/05/2016   Procedure: ESOPHAGOGASTRODUODENOSCOPY (EGD) WITH PROPOFOL;  Surgeon: Teena Irani, MD;  Location: [facility] ENDOSCOPY;  Service: Endoscopy;  Laterality: N/A;  . FEMORAL ARTERY STENT Left 05/2012; ~ 2015   Archie Endo 06/04/2012; Raechel Chute report  . FRACTURE SURGERY    . GIVENS CAPSULE STUDY N/A 12/22/2015   Procedure: GIVENS CAPSULE STUDY;  Surgeon: Wonda Horner, MD;  Location: [facility] ENDOSCOPY;  Service: Endoscopy;  Laterality: N/A;  . HARDWARE REMOVAL Right 11/15/2011   Removal of deep frontozygomatic orbital hardware/notes 11/15/2011  . HERNIA REPAIR  3013   Umbilical  . LAPAROSCOPY N/A 02/27/2016   Procedure: LAPAROSCOPY, LAPAROTOMY  WITH TWO SMALL BOWEL RESECTION;  Surgeon: Johnathan Hausen, MD;  Location: [facility] ORS;  Service: General;  Laterality: N/A;  . ORIF ORBITAL FRACTURE Right 08/15/2010    caught in a hydraulic machine; open reduction internal fixation of orbital rim fracture and open reduction of zygomatic arch fracture  Archie Endo 10/13/2009  . PORTACATH PLACEMENT Left 04/04/2016   Procedure: INSERTION PORT-A-CATH LEFT CHEST;  Surgeon: Melrose Nakayama, MD;  Location: [facility];  Service: Thoracic;  Laterality: Left;  Marland Kitchen VIDEO ASSISTED THORACOSCOPY (VATS)/ LOBECTOMY Right 10/18/2015   Procedure: VIDEO ASSISTED THORACOSCOPY (VATS)/ LOBECTOMY;  Surgeon: Melrose Nakayama, MD;  Location: [facility];  Service: Thoracic;  Laterality: Right;  Marland Kitchen VIDEO BRONCHOSCOPY Bilateral 09/21/2015   Procedure: VIDEO BRONCHOSCOPY WITH FLUORO;  Surgeon: Juanito Doom, MD;  Location: [facility] ENDOSCOPY;  Service: Cardiopulmonary;  Laterality: Bilateral;    REVIEW OF SYSTEMS:  A comprehensive review of systems was negative except for: Constitutional: positive for fatigue Gastrointestinal: positive for vomiting   PHYSICAL EXAMINATION: General appearance: alert, cooperative, fatigued and no distress Head: Normocephalic, without obvious  abnormality, atraumatic Neck: no adenopathy, no JVD, supple, symmetrical, trachea midline and thyroid not enlarged, symmetric, no tenderness/mass/nodules Lymph nodes: Cervical, supraclavicular, and axillary nodes normal. Resp: clear to auscultation bilaterally Back: symmetric, no curvature. ROM normal. No CVA tenderness. Cardio: Tachycardia GI: soft, non-tender; bowel sounds normal; no masses,  no organomegaly Extremities: extremities normal, atraumatic, no cyanosis or edema  ECOG PERFORMANCE STATUS: 1 - Symptomatic but completely ambulatory  Blood pressure 121/73, pulse (!) 118, temperature 97.6 F (36.4 C), temperature source Oral, resp. rate 17, height '5\' 7"'  (1.702 m), weight 110 lb (49.9 kg), SpO2 98 %.  LABORATORY DATA: Lab Results  Component Value Date   WBC 8.3 08/29/2016   HGB 14.7 08/29/2016   HCT 45.9 08/29/2016   MCV 87.8 08/29/2016   PLT 261 08/29/2016      Chemistry      Component Value Date/Time   NA 141 08/29/2016 1017   K 4.2 08/29/2016 1017   CL 103 04/04/2016 1206   CO2 28 08/29/2016 1017   BUN 8.8 08/29/2016 1017   CREATININE 0.6 (L) 08/29/2016 1017   GLU 398 (  H) 08/08/2016 1257      Component Value Date/Time   CALCIUM 9.6 08/29/2016 1017   ALKPHOS 66 08/29/2016 1017   AST 52 (H) 08/29/2016 1017   ALT 46 08/29/2016 1017   BILITOT 0.30 08/29/2016 1017       RADIOGRAPHIC STUDIES: Ct Soft Tissue Neck W Contrast  Result Date: 08/08/2016 CLINICAL DATA:  56 year old male with neck pain and dysphagia after a fall 2 weeks ago. Metastatic lung cancer, chemotherapy ongoing. EXAM: CT NECK WITH CONTRAST TECHNIQUE: Multidetector CT imaging of the neck was performed using the standard protocol following the bolus administration of intravenous contrast. CONTRAST:  100 mL Isovue 370 in conjunction with contrast enhanced imaging of the chest, abdomen, and pelvis Reported separately. COMPARISON:  brain MRI 04/03/2016.  PET-CT 10/03/2015 FINDINGS: Pharynx and larynx:  Negative larynx and pharynx. Negative parapharyngeal and retropharyngeal spaces. Negative cervical esophagus. Salivary glands: Negative sublingual space and submandibular glands. Negative parotid glands. Thyroid: Negative. Lymph nodes: Bilateral cervical lymph nodes are small and appear normal. Several small lymph nodes at the right thoracic inlet up to 6 mm short axis appear not significantly changed since the 2017 PET-CT (series 5, image 97). Vascular: Major vascular structures in the neck and at the skullbase are patent. There is a patent right carotid bifurcation stent extending into the right ICA. Left subclavian approach porta cath. Limited intracranial: Negative. Visualized orbits: Negative. Mastoids and visualized paranasal sinuses: Visualized paranasal sinuses and mastoids are stable and well pneumatized. Skeleton: Absent dentition. Visualized skull base is intact. No atlanto-occipital dissociation. Cervicothoracic junction alignment is within normal limits. Bilateral posterior element alignment is within normal limits. No cervical spine fracture. Trace anterolisthesis of C3 on C4 with associated right greater than left chronic facet degeneration. No acute or suspicious osseous lesion in the neck. Upper chest: Chest CT findings are reported separately today with the abdomen and pelvis. IMPRESSION: 1. Negative neck CT. No explanation for dysphagia, and no metastatic disease identified in the neck. 2. No recent traumatic injury identified. Mild cervical spine degeneration. 3. CT chest, abdomen, and pelvis today are reported separately. Electronically Signed   By: Genevie Ann M.D.   On: 08/08/2016 10:23   Ct Chest W Contrast  Result Date: 08/08/2016 CLINICAL DATA:  Fall 2 weeks ago. Posterior neck pain. Metastatic lung cancer to small bowel, ongoing chemotherapy. EXAM: CT CHEST, ABDOMEN, AND PELVIS WITH CONTRAST TECHNIQUE: Multidetector CT imaging of the chest, abdomen and pelvis was performed following the  standard protocol during bolus administration of intravenous contrast. CONTRAST:  100 cc Isovue 370 COMPARISON:  04/03/2016 FINDINGS: CT CHEST FINDINGS Cardiovascular: Coronary, aortic arch, and branch vessel atherosclerotic vascular disease. Left Port-A-Cath tip: SVC. Mediastinum/Nodes: Unremarkable Lungs/Pleura: Right upper lobectomy. No significant lung nodule or recurrence in the lung parenchymal identified. Musculoskeletal: There is a new, healing right anterior fifth rib fracture shown on image 99/7. Lower thoracic spondylosis noted. CT ABDOMEN PELVIS FINDINGS Hepatobiliary: Mildly contracted gallbladder. The liver appears unremarkable. Pancreas: No significant abnormality. Slight prominence of posterior pancreatic head parenchyma for example on image 68/3 is chronically stable and was not hypermetabolic on prior PET-CT of 10/03/2015, hence likely normal variant. Spleen: Unremarkable Adrenals/Urinary Tract: Unremarkable Stomach/Bowel: Contrast medium extends up into the distal esophagus and probably reflects reflux. Postoperative findings along right lower quadrant small bowel. Borderline prominence of stool in the colon. Vascular/Lymphatic: Aortoiliac atherosclerotic vascular disease. Indistinctly marginated and potentially centrally necrotic left periaortic lymph node measures 1.2 cm in short axis on image 72/3, formerly 1.4 cm. However,  several other previously enlarged lymph nodes are significantly improved, including a 0.7 cm indistinctly marginated lymph node on image 67/3 which previously measured 1.3 cm in short axis, as well as other retroperitoneal lymph nodes. The overall appearance constitutes improvement. There is mesenteric stranding and in indistinctly marginated mesenteric lymph node measures 0.8 cm in short axis on image 71/3, formerly 1.4 cm. Reproductive: Dense calcification in the right central portion of the prostate gland. Other: No supplemental non-categorized findings. Musculoskeletal:  Lumbar spondylosis and degenerative disc disease resulting in mild bilateral foraminal impingement at L4-5. IMPRESSION: 1. Overall improved compared to prior, with reduced retroperitoneal and mesenteric adenopathy. There is some accentuated low-level stranding in the central mesentery which is likely treatment related. The adenopathy is not completely resolved, and there is a persistent 1.2 cm potentially centrally necrotic left periaortic lymph node on image 72/3. 2. No new metastatic lesions are identified. Postoperative findings in the right lower quadrant small bowel. 3. Probable gastroesophageal reflux. 4.  Aortic Atherosclerosis (ICD10-I70.0).  Coronary atherosclerosis. 5. There is a new healing (subacute) right anterior fifth rib fracture which was not present on 04/03/2016. This is most likely benign based on appearance. 6. Mild bilateral foraminal impingement at L4-5 due to spondylosis and degenerative disc disease. Electronically Signed   By: Van Clines M.D.   On: 08/08/2016 12:01   Ct Abdomen Pelvis W Contrast  Result Date: 08/08/2016 CLINICAL DATA:  Fall 2 weeks ago. Posterior neck pain. Metastatic lung cancer to small bowel, ongoing chemotherapy. EXAM: CT CHEST, ABDOMEN, AND PELVIS WITH CONTRAST TECHNIQUE: Multidetector CT imaging of the chest, abdomen and pelvis was performed following the standard protocol during bolus administration of intravenous contrast. CONTRAST:  100 cc Isovue 370 COMPARISON:  04/03/2016 FINDINGS: CT CHEST FINDINGS Cardiovascular: Coronary, aortic arch, and branch vessel atherosclerotic vascular disease. Left Port-A-Cath tip: SVC. Mediastinum/Nodes: Unremarkable Lungs/Pleura: Right upper lobectomy. No significant lung nodule or recurrence in the lung parenchymal identified. Musculoskeletal: There is a new, healing right anterior fifth rib fracture shown on image 99/7. Lower thoracic spondylosis noted. CT ABDOMEN PELVIS FINDINGS Hepatobiliary: Mildly contracted  gallbladder. The liver appears unremarkable. Pancreas: No significant abnormality. Slight prominence of posterior pancreatic head parenchyma for example on image 68/3 is chronically stable and was not hypermetabolic on prior PET-CT of 10/03/2015, hence likely normal variant. Spleen: Unremarkable Adrenals/Urinary Tract: Unremarkable Stomach/Bowel: Contrast medium extends up into the distal esophagus and probably reflects reflux. Postoperative findings along right lower quadrant small bowel. Borderline prominence of stool in the colon. Vascular/Lymphatic: Aortoiliac atherosclerotic vascular disease. Indistinctly marginated and potentially centrally necrotic left periaortic lymph node measures 1.2 cm in short axis on image 72/3, formerly 1.4 cm. However, several other previously enlarged lymph nodes are significantly improved, including a 0.7 cm indistinctly marginated lymph node on image 67/3 which previously measured 1.3 cm in short axis, as well as other retroperitoneal lymph nodes. The overall appearance constitutes improvement. There is mesenteric stranding and in indistinctly marginated mesenteric lymph node measures 0.8 cm in short axis on image 71/3, formerly 1.4 cm. Reproductive: Dense calcification in the right central portion of the prostate gland. Other: No supplemental non-categorized findings. Musculoskeletal: Lumbar spondylosis and degenerative disc disease resulting in mild bilateral foraminal impingement at L4-5. IMPRESSION: 1. Overall improved compared to prior, with reduced retroperitoneal and mesenteric adenopathy. There is some accentuated low-level stranding in the central mesentery which is likely treatment related. The adenopathy is not completely resolved, and there is a persistent 1.2 cm potentially centrally necrotic left periaortic lymph node  on image 72/3. 2. No new metastatic lesions are identified. Postoperative findings in the right lower quadrant small bowel. 3. Probable  gastroesophageal reflux. 4.  Aortic Atherosclerosis (ICD10-I70.0).  Coronary atherosclerosis. 5. There is a new healing (subacute) right anterior fifth rib fracture which was not present on 04/03/2016. This is most likely benign based on appearance. 6. Mild bilateral foraminal impingement at L4-5 due to spondylosis and degenerative disc disease. Electronically Signed   By: Van Clines M.D.   On: 08/08/2016 12:01     ASSESSMENT AND PLAN:  This is a very pleasant 56 years old white male with metastatic non-small cell lung cancer, adenocarcinoma status post right upper lobectomy with lymph node dissection. Unfortunately the patient was found to have metastatic disease in the jejunum and terminal ileum.  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 The patient was started on treatment with systemic chemotherapy with carboplatin and Alimta for 2 cycles discontinued secondary to intolerance and disease progression. The patient is currently undergoing treatment with second line immunotherapy with Ketruda (pembrolizumab) 200 mg IV every 3 weeks, status post 4 cycles and has been tolerating this treatment well. The patient continues to do well. I recommended for him to proceed with cycle #5 today as scheduled. I would see him back for follow-up visit in 3 weeks for evaluation before starting cycle #6. His appetite is much better and he discontinued Marinol. The patient was advised to call immediately if he has any concerning symptoms in the interval. The patient voices understanding of current disease status and treatment options and is in agreement with the current care plan. All questions were answered. The patient knows to call the clinic with any problems, questions or concerns. We can certainly see the patient much sooner if necessary.  Disclaimer: This note was  dictated with voice recognition software. Similar sounding words can inadvertently be transcribed and may not be corrected upon review.

## 2016-09-09 ENCOUNTER — Encounter: Payer: Self-pay | Admitting: Internal Medicine

## 2016-09-09 ENCOUNTER — Other Ambulatory Visit: Payer: Self-pay | Admitting: Internal Medicine

## 2016-09-09 DIAGNOSIS — G894 Chronic pain syndrome: Secondary | ICD-10-CM

## 2016-09-09 DIAGNOSIS — C3491 Malignant neoplasm of unspecified part of right bronchus or lung: Secondary | ICD-10-CM

## 2016-09-09 DIAGNOSIS — M542 Cervicalgia: Secondary | ICD-10-CM

## 2016-09-09 MED ORDER — OXYCODONE-ACETAMINOPHEN 10-325 MG PO TABS: 1.0000 | ORAL_TABLET | Freq: Four times a day (QID) | ORAL | 0 refills | Status: DC | PRN

## 2016-09-09 NOTE — Progress Notes (Unsigned)
Mr. Sane walked into clinic and noted inability to extend head after a fall 2 months ago when he hit the back of his head against the floor when he fell.  Examination reveals no abnormalities on palpation, but there is some weakness to extension.  A CT of the neck was relatively unremarkable.  We will obtain an MRI of the neck to assess for tendinopathy as this is affecting his ability to eat because of excessive flexion of the neck.

## 2016-09-09 NOTE — Telephone Encounter (Signed)
Refill Request on his Hydrocodone

## 2016-09-09 NOTE — Telephone Encounter (Signed)
Spoke with Brett [name].  Apparently he has been using both the hydrocodone and the oxycodone.  He states that the oxycodone is much more effective.  We have decided to stop the hydrocodone altogether and use the oxycodone exclusively.  I have change the prescription to the following: Oxycodone-Acetaminophen 10-325 mg 1-2 tablets Q6H PRN severe pain, disp #240.  He was handed 3 prescriptions which should get him through mid-November.

## 2016-09-19 ENCOUNTER — Other Ambulatory Visit (HOSPITAL_BASED_OUTPATIENT_CLINIC_OR_DEPARTMENT_OTHER): Payer: Medicare Other

## 2016-09-19 ENCOUNTER — Ambulatory Visit: Payer: Medicare Other | Admitting: Nutrition

## 2016-09-19 ENCOUNTER — Ambulatory Visit (HOSPITAL_BASED_OUTPATIENT_CLINIC_OR_DEPARTMENT_OTHER): Payer: Medicare Other | Admitting: Internal Medicine

## 2016-09-19 ENCOUNTER — Ambulatory Visit (HOSPITAL_BASED_OUTPATIENT_CLINIC_OR_DEPARTMENT_OTHER): Payer: Medicare Other

## 2016-09-19 ENCOUNTER — Encounter: Payer: Self-pay | Admitting: Internal Medicine

## 2016-09-19 VITALS — BP 99/68 | HR 106 | Temp 98.6°F | Resp 18 | Wt 112.7 lb

## 2016-09-19 DIAGNOSIS — Z5112 Encounter for antineoplastic immunotherapy: Secondary | ICD-10-CM

## 2016-09-19 DIAGNOSIS — C3411 Malignant neoplasm of upper lobe, right bronchus or lung: Secondary | ICD-10-CM | POA: Diagnosis not present

## 2016-09-19 DIAGNOSIS — C3491 Malignant neoplasm of unspecified part of right bronchus or lung: Secondary | ICD-10-CM

## 2016-09-19 DIAGNOSIS — I10 Essential (primary) hypertension: Secondary | ICD-10-CM

## 2016-09-19 DIAGNOSIS — C784 Secondary malignant neoplasm of small intestine: Secondary | ICD-10-CM

## 2016-09-19 DIAGNOSIS — C171 Malignant neoplasm of jejunum: Secondary | ICD-10-CM

## 2016-09-19 DIAGNOSIS — R5382 Chronic fatigue, unspecified: Secondary | ICD-10-CM

## 2016-09-19 LAB — CBC WITH DIFFERENTIAL/PLATELET
BASO%: 0.8 % (ref 0.0–2.0)
Basophils Absolute: 0.1 10*3/uL (ref 0.0–0.1)
EOS%: 2.5 % (ref 0.0–7.0)
Eosinophils Absolute: 0.2 10*3/uL (ref 0.0–0.5)
HCT: 41.7 % (ref 38.4–49.9)
HGB: 13.7 g/dL (ref 13.0–17.1)
LYMPH%: 17 % (ref 14.0–49.0)
MCH: 28.1 pg (ref 27.2–33.4)
MCHC: 32.7 g/dL (ref 32.0–36.0)
MCV: 85.9 fL (ref 79.3–98.0)
MONO#: 0.8 10*3/uL (ref 0.1–0.9)
MONO%: 12.9 % (ref 0.0–14.0)
NEUT#: 4.4 10*3/uL (ref 1.5–6.5)
NEUT%: 66.8 % (ref 39.0–75.0)
Platelets: 233 10*3/uL (ref 140–400)
RBC: 4.86 10*6/uL (ref 4.20–5.82)
RDW: 18.3 % — ABNORMAL HIGH (ref 11.0–14.6)
WBC: 6.5 10*3/uL (ref 4.0–10.3)
lymph#: 1.1 10*3/uL (ref 0.9–3.3)

## 2016-09-19 LAB — COMPREHENSIVE METABOLIC PANEL
ALT: 21 U/L (ref 0–55)
AST: 27 U/L (ref 5–34)
Albumin: 3.6 g/dL (ref 3.5–5.0)
Alkaline Phosphatase: 82 U/L (ref 40–150)
Anion Gap: 8 mEq/L (ref 3–11)
BUN: 10.4 mg/dL (ref 7.0–26.0)
CO2: 27 mEq/L (ref 22–29)
Calcium: 9.7 mg/dL (ref 8.4–10.4)
Chloride: 106 mEq/L (ref 98–109)
Creatinine: 0.7 mg/dL (ref 0.7–1.3)
EGFR: >90 mL/min/{1.73_m2} (ref >90)
Glucose: 96 mg/dl (ref 70–140)
Potassium: 4.1 mEq/L (ref 3.5–5.1)
Sodium: 141 mEq/L (ref 136–145)
Total Bilirubin: 0.4 mg/dL (ref 0.20–1.20)
Total Protein: 7.2 g/dL (ref 6.4–8.3)

## 2016-09-19 LAB — TSH: TSH: 2.589 m(IU)/L (ref 0.320–4.118)

## 2016-09-19 MED ORDER — HEPARIN SOD (PORK) LOCK FLUSH 100 UNIT/ML IV SOLN
500.0000 [IU] | Freq: Once | INTRAVENOUS | Status: AC | PRN
  Administered 2016-09-19: 500 [IU]
  Filled 2016-09-19: qty 5

## 2016-09-19 MED ORDER — SODIUM CHLORIDE 0.9 % IV SOLN
Freq: Once | INTRAVENOUS | Status: AC
  Administered 2016-09-19: 12:00:00 via INTRAVENOUS

## 2016-09-19 MED ORDER — SODIUM CHLORIDE 0.9 % IV SOLN
200.0000 mg | Freq: Once | INTRAVENOUS | Status: AC
  Administered 2016-09-19: 200 mg via INTRAVENOUS
  Filled 2016-09-19: qty 8

## 2016-09-19 MED ORDER — SODIUM CHLORIDE 0.9% FLUSH
10.0000 mL | INTRAVENOUS | Status: DC | PRN
  Administered 2016-09-19: 10 mL
  Filled 2016-09-19: qty 10

## 2016-09-19 NOTE — Progress Notes (Signed)
Nutrition follow-up completed with patient during infusion for metastatic lung cancer.  Patient is on Keytruda. Patient reports dysphagia has improved. His appetite has gotten better.  He is eating more. He has discontinued Marinol. Weight improved documented as 112.7 pounds on August 30 increased from 110 pounds August 9. Labs were reviewed.  Nutrition diagnosis:  Food and nutrition related knowledge deficit resolved.  Intervention: Patient will continue strategies for increased calories and protein. He will continue Carnation breakfast essentials Patient will monitor his weight and contact me if he notices weight loss. Questions were answered.  Teach back method used.  Monitoring, evaluation, goals: Patient will tolerate adequate calories and protein to promote weight gain.  Next visit: To be scheduled as needed.  **Disclaimer: This note was dictated with voice recognition software. Similar sounding words can inadvertently be transcribed and this note may contain transcription errors which may not have been corrected upon publication of note.**

## 2016-09-19 NOTE — Progress Notes (Signed)
[place] [phone]   [phone]  OFFICE PROGRESS NOTE  [name], MD [address] [place] [address]  DIAGNOSIS: Stage IV (T1b, N0, M1b) non-small cell lung cancer, adenocarcinoma presented with right upper lobe lung nodule and recent metastasis to the small intestine. This was initially diagnosed in September 2017.  Genomic Alterations Identified? ERBB2 amplification - equivocal? CDKN2A p16INK4a E88* and p14ARF L753Y SMARCA4 splice site [id number]>VA SPTA1 E2022* TOP2A amplification TP53 A159P Additional Findings? Microsatellite status MS-Stable Tumor Mutation Burden TMB-Intermediate; 18 Muts/Mb Additional Disease-relevant Genes with No Reportable Alterations Identified? EGFR KRAS ALK BRAF MET RET ROS1   PRIOR THERAPY:  1) Status post right VATS with right upper lobectomy and mediastinal lymph node dissection under the care of Dr. Roxan Hockey on 10/18/2015 and the final pathology was consistent with stage IA (T1b, N0, MX). 2) upper endoscopy on 01/05/2016 showed normal esophagus, normal stomach but there was occasional mass around 3.0 CM in length circumferential nonobstructing in the jejunum. The final pathology was consistent with metastatic adenocarcinoma. 3) status post laparoscopic laparotomy and resection of proximal lesion and and distal jejunum/proximal ileum under the care of Dr. Hassell Done 1 02/27/2016. 3)  Systemic chemotherapy with carboplatin for AUC of 5 and Alimta 500 MG/M2 every 3 weeks. First dose 04/04/2016. Status post 2 cycles. Last dose was given 04/21/2016 discontinued secondary to disease progression.   CURRENT THERAPY: Second line immunotherapy with Ketruda 200 mg IV every 2 weeks, first dose 05/30/2016. Status post 5 cycles.  INTERVAL HISTORY: Brett [name] 56 y.o. male returns to the clinic today for follow-up visit accompanied by his wife. The patient tolerated the last cycle of his treatment  with immunotherapy fairly well. He denied having any chest pain, shortness of breath, cough or hemoptysis. He denied having any weight loss or night sweats. He has no nausea, vomiting, diarrhea or constipation. He is here today for evaluation before starting cycle #6 of his treatment.   MEDICAL HISTORY: Past Medical History:  Diagnosis Date  . Anemia 11/28/2015  . Anxiety   . Arthritis    "hands, back" (11/28/2015)  . Barrett's esophagus 07/01/2013   Without dysplasia on biopsy 09/03/2012. Repeat EGD recommended 08/2015  . Carotid artery stenosis 07/01/2013   Requiring right sided stent   . Chronic pain syndrome 07/01/2013  . Closed head injury with brief loss of consciousness ([place]) 07/22/2010   Head trapped in a hydraulic device at work.  Fracture of orbital bones on right and brief loss of consciousness per report.  . 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 There was likely a strong psycho-social overlay in regard to the cognitive deficits in the form of mood disorder with psychotic features and mixed anxiety symptomatology. His primary tested cognitive deficits are in the areas of attention, executi  . Daily headache "since 07/2010"   constantly  . Degenerative joint disease of cervical spine 07/01/2013  . Dehydration 04/11/2016  . Dupuytren's contracture of both hands 04/08/2014  . Encounter for antineoplastic chemotherapy 10/05/2015  . Encounter for antineoplastic immunotherapy 05/24/2016  . Erectile dysfunction associated with type 2 diabetes mellitus ([place]) 07/01/2013  . Fibromyalgia 07/01/2013  . Goals of care, counseling/discussion 03/28/2016  . History of blood transfusion 11/28/2015   "suppose to get his first today" (11/28/2015)  . Hyperlipidemia LDL goal < 100 07/01/2013  . Intractable hiccups 04/11/2016  .  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  Memory changes    "memory issues" from head injury  . Osteoarthritis of right thumb 10/21/2014  . Peripheral vascular occlusive disease ([place]) 07/01/2013   Requiring 2 arterial stents above the left knee per report  . Pneumonia ~ 2006/2007  . Post traumatic stress disorder 07/01/2013  . Primary lung adenocarcinoma ([place]) dx'd 08/2015   "right lung"  . Severe major depression with psychotic features ([place]) 04/15/2011  . Tobacco abuse 07/01/2013  . Tobacco abuse   . Type 2 diabetes mellitus with vascular disease ([place]) 07/01/2013   Left lower extremity and right carotid stenting    ALLERGIES:  is allergic to gabapentin; lyrica [pregabalin]; celebrex [celecoxib]; and contrast media [iodinated diagnostic agents].  MEDICATIONS:  Current Outpatient Prescriptions  Medication Sig Dispense Refill  . aspirin EC 81 MG tablet Take 81 mg by mouth daily.     Marland Kitchen atorvastatin (LIPITOR) 40 MG tablet Take 1 tablet (40 mg total) by mouth daily. 90 tablet 3  . clopidogrel (PLAVIX) 75 MG tablet Take 75 mg by mouth at bedtime.     . Ferrous Sulfate (IRON) 325 (65 Fe) MG TABS Take 325 mg by mouth 2 (two) times daily with a meal. 120 each 0  . lidocaine-prilocaine (EMLA) cream Apply 1 application topically as needed. 30 g 0  . lisinopril (PRINIVIL,ZESTRIL) 5 MG tablet Take 0.5 tablets (2.5 mg total) by mouth daily. 90 tablet 3  . metFORMIN (GLUCOPHAGE) 500 MG tablet Take 1 tablet (500 mg total) by mouth 2 (two) times daily with a meal. 180 tablet 3  . metoCLOPramide (REGLAN) 10 MG tablet TAKE 1 TABLET BY MOUTH FOUR TIMES DAILY AS NEEDED AS DIRECTED 30 tablet 0  . ondansetron (ZOFRAN) 4 MG tablet Take 1 tablet (4 mg total) by mouth 3 (three) times daily. 20 tablet 0  . oxyCODONE-acetaminophen (PERCOCET) 10-325 MG tablet Take 1-2 tablets by mouth every 6 (six) hours as needed for pain. 240 tablet 0  . pantoprazole (PROTONIX) 40 MG tablet Take 1 tablet (40 mg total) by mouth daily. 90 tablet 3  . prochlorperazine  (COMPAZINE) 10 MG tablet Take 1 tablet (10 mg total) by mouth every 6 (six) hours as needed for nausea or vomiting. 30 tablet 0  . prochlorperazine (COMPAZINE) 25 MG suppository Place 1 suppository (25 mg total) rectally every 12 (twelve) hours as needed for refractory nausea / vomiting. 12 suppository 11  . senna-docusate (SENOKOT-S) 8.6-50 MG tablet Take 1-2 tablets by mouth 2 (two) times daily as needed for mild constipation or moderate constipation. 30 tablet 1  . sitaGLIPtin (JANUVIA) 100 MG tablet Take 1 tablet (100 mg total) by mouth daily. 90 tablet 3  . diphenhydrAMINE (BENADRYL) 25 mg capsule Take 50 mg by mouth as directed. Take prior to CT scan as directed    . dronabinol (MARINOL) 2.5 MG capsule Take 1 capsule (2.5 mg total) by mouth 2 (two) times daily before a meal. (Patient not taking: Reported on 09/19/2016) 60 capsule 0  . predniSONE (DELTASONE) 50 MG tablet Take 50 mg by mouth as directed. Take as directed before CT scan  0  . sorbitol 70 % solution Take 15 cc every 4 hours until bowel movement (Patient not taking: Reported on 09/19/2016) 473 mL 0   No current facility-administered medications for this visit.     SURGICAL HISTORY:  Past Surgical History:  Procedure Laterality Date  . CAROTID STENT Right ?2014  . COLONOSCOPY N/A 11/30/2015   Procedure: COLONOSCOPY;  Surgeon: Teena Irani, MD;  Location: [facility] ENDOSCOPY;  Service: Endoscopy;  Laterality: N/A;  . ESOPHAGOGASTRODUODENOSCOPY N/A 11/30/2015   Procedure: ESOPHAGOGASTRODUODENOSCOPY (EGD);  Surgeon: Teena Irani, MD;  Location: [facility] ENDOSCOPY;  Service: Endoscopy;  Laterality: N/A;  . ESOPHAGOGASTRODUODENOSCOPY (EGD) WITH PROPOFOL N/A 01/05/2016   Procedure: ESOPHAGOGASTRODUODENOSCOPY (EGD) WITH PROPOFOL;  Surgeon: Teena Irani, MD;  Location: [facility] ENDOSCOPY;  Service: Endoscopy;  Laterality: N/A;  . FEMORAL ARTERY STENT Left 05/2012; ~ 2015   Ar[facility] Endo 06/04/2012; Raechel Chute report  . FRACTURE SURGERY    . GIVENS CAPSULE STUDY N/A  12/22/2015   Procedure: GIVENS CAPSULE STUDY;  Surgeon: Wonda Horner, MD;  Location: [facility] ENDOSCOPY;  Service: Endoscopy;  Laterality: N/A;  . HARDWARE REMOVAL Right 11/15/2011   Removal of deep frontozygomatic orbital hardware/notes 11/15/2011  . HERNIA REPAIR  4098   Umbilical  . LAPAROSCOPY N/A 02/27/2016   Procedure: LAPAROSCOPY, LAPAROTOMY  WITH TWO SMALL BOWEL RESECTION;  Surgeon: Johnathan Hausen, MD;  Location: [facility] ORS;  Service: General;  Laterality: N/A;  . ORIF ORBITAL FRACTURE Right 08/15/2010    caught in a hydraulic machine; open reduction internal fixation of orbital rim fracture and open reduction of zygomatic arch fracture  Ar[facility] Endo 10/13/2009  . PORTACATH PLACEMENT Left 04/04/2016   Procedure: INSERTION PORT-A-CATH LEFT CHEST;  Surgeon: Melrose Nakayama, MD;  Location: [facility];  Service: Thoracic;  Laterality: Left;  Marland Kitchen VIDEO ASSISTED THORACOSCOPY (VATS)/ LOBECTOMY Right 10/18/2015   Procedure: VIDEO ASSISTED THORACOSCOPY (VATS)/ LOBECTOMY;  Surgeon: Melrose Nakayama, MD;  Location: [facility];  Service: Thoracic;  Laterality: Right;  Marland Kitchen VIDEO BRONCHOSCOPY Bilateral 09/21/2015   Procedure: VIDEO BRONCHOSCOPY WITH FLUORO;  Surgeon: Juanito Doom, MD;  Location: [facility] ENDOSCOPY;  Service: Cardiopulmonary;  Laterality: Bilateral;    REVIEW OF SYSTEMS:  A comprehensive review of systems was negative.   PHYSICAL EXAMINATION: General appearance: alert, cooperative, fatigued and no distress Head: Normocephalic, without obvious abnormality, atraumatic Neck: no adenopathy, no JVD, supple, symmetrical, trachea midline and thyroid not enlarged, symmetric, no tenderness/mass/nodules Lymph nodes: Cervical, supraclavicular, and axillary nodes normal. Resp: clear to auscultation bilaterally Back: symmetric, no curvature. ROM normal. No CVA tenderness. Cardio: regular rate and rhythm, S1, S2 normal, no murmur, click, rub or gallop GI: soft, non-tender; bowel sounds normal; no masses,  no  organomegaly Extremities: extremities normal, atraumatic, no cyanosis or edema  ECOG PERFORMANCE STATUS: 1 - Symptomatic but completely ambulatory  Blood pressure 99/68, pulse (!) 106, temperature 98.6 F (37 C), temperature source Oral, resp. rate 18, weight 112 lb 11.2 oz (51.1 kg), SpO2 100 %.  LABORATORY DATA: Lab Results  Component Value Date   WBC 6.5 09/19/2016   HGB 13.7 09/19/2016   HCT 41.7 09/19/2016   MCV 85.9 09/19/2016   PLT 233 09/19/2016      Chemistry      Component Value Date/Time   NA 141 09/19/2016 1023   K 4.1 09/19/2016 1023   CL 103 04/04/2016 1206   CO2 27 09/19/2016 1023   BUN 10.4 09/19/2016 1023   CREATININE 0.7 09/19/2016 1023   GLU 398 (H) 08/08/2016 1257      Component Value Date/Time   CALCIUM 9.7 09/19/2016 1023   ALKPHOS 82 09/19/2016 1023   AST 27 09/19/2016 1023   ALT 21 09/19/2016 1023   BILITOT 0.40 09/19/2016 1023       RADIOGRAPHIC STUDIES: No results found.   ASSESSMENT AND PLAN:  This is a very pleasant 56 years old white male with metastatic non-small  cell lung cancer, adenocarcinoma status post right upper lobectomy with lymph node dissection. Unfortunately the patient was found to have metastatic disease in the jejunum and terminal ileum.  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 The patient was started on treatment with systemic chemotherapy with carboplatin and Alimta for 2 cycles discontinued secondary to intolerance and disease progression. The patient is currently undergoing treatment with second line immunotherapy with Ketruda (pembrolizumab) 200 mg IV every 3 weeks, status post 5 cycles. I recommended for the patient to proceed with cycle #6 today as scheduled. I would see him back for follow-up visit in 3 weeks after repeating CT scan of the chest, abdomen and pelvis for restaging  of his disease. He was advised to call immediately if he has any concerning symptoms in the interval. The patient voices understanding of current disease status and treatment options and is in agreement with the current care plan. All questions were answered. The patient knows to call the clinic with any problems, questions or concerns. We can certainly see the patient much sooner if necessary.  Disclaimer: This note was dictated with voice recognition software. Similar sounding words can inadvertently be transcribed and may not be corrected upon review.

## 2016-09-19 NOTE — Patient Instructions (Addendum)
[facility] Discharge Instructions for Patients Receiving Chemotherapy  Today you received the following chemotherapy agents:  Keytruda  To help prevent nausea and vomiting after your treatment, we encourage you to take your nausea medication as prescribed.   If you develop nausea and vomiting that is not controlled by your nausea medication, call the clinic.   BELOW ARE SYMPTOMS THAT SHOULD BE REPORTED IMMEDIATELY:  *FEVER GREATER THAN 100.5 F  *CHILLS WITH OR WITHOUT FEVER  NAUSEA AND VOMITING THAT IS NOT CONTROLLED WITH YOUR NAUSEA MEDICATION  *UNUSUAL SHORTNESS OF BREATH  *UNUSUAL BRUISING OR BLEEDING  TENDERNESS IN MOUTH AND THROAT WITH OR WITHOUT PRESENCE OF ULCERS  *URINARY PROBLEMS  *BOWEL PROBLEMS  UNUSUAL RASH Items with * indicate a potential emergency and should be followed up as soon as possible.  Feel free to call the clinic you have any questions or concerns. The clinic phone number is [phone].  Please show the [facility] at check-in to the Emergency Department and triage nurse.

## 2016-09-20 ENCOUNTER — Telehealth: Payer: Self-pay | Admitting: Internal Medicine

## 2016-09-20 NOTE — Telephone Encounter (Signed)
Scheduled appt per 8/30 los - patient to get new treatment schedule next visit.

## 2016-09-26 ENCOUNTER — Ambulatory Visit (HOSPITAL_COMMUNITY): Admission: RE | Admit: 2016-09-26 | Payer: Medicare Other | Source: Ambulatory Visit

## 2016-10-04 ENCOUNTER — Telehealth: Payer: Self-pay | Admitting: *Deleted

## 2016-10-04 NOTE — Telephone Encounter (Signed)
Oncology Nurse Navigator Documentation  Oncology Nurse Navigator Flowsheets 10/04/2016  Navigator Location [facility]  Navigator Encounter Type Telephone/I followed up on Brett [name] appts. Patient needs CT scan before his appt with Dr. Julien Nordmann next week. I called and spoke with patient's wife.  I updated her on the need for scan and gave her central scheduling to call to schedule. She verbalized understanding. I will update authorization coordinator.   Telephone Outgoing Call  Treatment Phase Treatment  Barriers/Navigation Needs Education;Coordination of Care  Education Other  Interventions Coordination of Care;Education  Coordination of Care Other  Education Method Verbal  Acuity Level 1  Time Spent with Patient 30

## 2016-10-07 ENCOUNTER — Other Ambulatory Visit: Payer: Self-pay | Admitting: Internal Medicine

## 2016-10-07 DIAGNOSIS — D500 Iron deficiency anemia secondary to blood loss (chronic): Secondary | ICD-10-CM

## 2016-10-08 ENCOUNTER — Other Ambulatory Visit: Payer: Self-pay | Admitting: Medical Oncology

## 2016-10-08 DIAGNOSIS — C171 Malignant neoplasm of jejunum: Secondary | ICD-10-CM

## 2016-10-08 DIAGNOSIS — Z5112 Encounter for antineoplastic immunotherapy: Secondary | ICD-10-CM

## 2016-10-08 DIAGNOSIS — C3491 Malignant neoplasm of unspecified part of right bronchus or lung: Secondary | ICD-10-CM

## 2016-10-08 DIAGNOSIS — E1159 Type 2 diabetes mellitus with other circulatory complications: Secondary | ICD-10-CM

## 2016-10-08 MED ORDER — PREDNISONE 50 MG PO TABS: 50.0000 mg | ORAL_TABLET | ORAL | 2 refills | Status: DC

## 2016-10-08 MED ORDER — DIPHENHYDRAMINE HCL 25 MG PO CAPS: 50.0000 mg | ORAL_CAPSULE | ORAL | 0 refills | Status: DC

## 2016-10-08 NOTE — Telephone Encounter (Signed)
Last CBC was normal.  Will reassess need for continued iron supplementation with a repeat iron panel at the follow-up visit.  If iron is indicated will renew at that time.

## 2016-10-09 ENCOUNTER — Ambulatory Visit (HOSPITAL_COMMUNITY)
Admission: RE | Admit: 2016-10-09 | Discharge: 2016-10-09 | Disposition: A | Discharge status: Home / Self Care | Payer: Medicare Other | Source: Ambulatory Visit | Attending: Internal Medicine | Admitting: Internal Medicine

## 2016-10-09 DIAGNOSIS — Z5112 Encounter for antineoplastic immunotherapy: Secondary | ICD-10-CM | POA: Diagnosis not present

## 2016-10-09 DIAGNOSIS — R918 Other nonspecific abnormal finding of lung field: Secondary | ICD-10-CM | POA: Insufficient documentation

## 2016-10-09 DIAGNOSIS — M47896 Other spondylosis, lumbar region: Secondary | ICD-10-CM | POA: Diagnosis not present

## 2016-10-09 DIAGNOSIS — I2510 Atherosclerotic heart disease of native coronary artery without angina pectoris: Secondary | ICD-10-CM | POA: Insufficient documentation

## 2016-10-09 DIAGNOSIS — C171 Malignant neoplasm of jejunum: Secondary | ICD-10-CM | POA: Diagnosis not present

## 2016-10-09 DIAGNOSIS — M5136 Other intervertebral disc degeneration, lumbar region: Secondary | ICD-10-CM | POA: Insufficient documentation

## 2016-10-09 DIAGNOSIS — I700 Atherosclerosis of aorta: Secondary | ICD-10-CM | POA: Diagnosis not present

## 2016-10-09 DIAGNOSIS — C3491 Malignant neoplasm of unspecified part of right bronchus or lung: Secondary | ICD-10-CM | POA: Diagnosis not present

## 2016-10-09 DIAGNOSIS — Z9889 Other specified postprocedural states: Secondary | ICD-10-CM | POA: Insufficient documentation

## 2016-10-09 DIAGNOSIS — C3490 Malignant neoplasm of unspecified part of unspecified bronchus or lung: Secondary | ICD-10-CM | POA: Diagnosis not present

## 2016-10-09 MED ORDER — IOPAMIDOL (ISOVUE-300) INJECTION 61%
100.0000 mL | Freq: Once | INTRAVENOUS | Status: AC | PRN
  Administered 2016-10-09: 100 mL via INTRAVENOUS

## 2016-10-10 ENCOUNTER — Telehealth: Payer: Self-pay | Admitting: Internal Medicine

## 2016-10-10 ENCOUNTER — Encounter: Payer: Self-pay | Admitting: Internal Medicine

## 2016-10-10 ENCOUNTER — Other Ambulatory Visit (HOSPITAL_BASED_OUTPATIENT_CLINIC_OR_DEPARTMENT_OTHER): Payer: Medicare Other

## 2016-10-10 ENCOUNTER — Ambulatory Visit (HOSPITAL_BASED_OUTPATIENT_CLINIC_OR_DEPARTMENT_OTHER): Payer: Medicare Other | Admitting: Internal Medicine

## 2016-10-10 ENCOUNTER — Ambulatory Visit (HOSPITAL_BASED_OUTPATIENT_CLINIC_OR_DEPARTMENT_OTHER): Payer: Medicare Other

## 2016-10-10 VITALS — BP 110/73 | HR 71 | Temp 97.9°F | Resp 18 | Wt 115.3 lb

## 2016-10-10 DIAGNOSIS — C784 Secondary malignant neoplasm of small intestine: Secondary | ICD-10-CM

## 2016-10-10 DIAGNOSIS — C3411 Malignant neoplasm of upper lobe, right bronchus or lung: Secondary | ICD-10-CM | POA: Diagnosis not present

## 2016-10-10 DIAGNOSIS — Z5112 Encounter for antineoplastic immunotherapy: Secondary | ICD-10-CM

## 2016-10-10 DIAGNOSIS — C3491 Malignant neoplasm of unspecified part of right bronchus or lung: Secondary | ICD-10-CM

## 2016-10-10 DIAGNOSIS — M542 Cervicalgia: Secondary | ICD-10-CM | POA: Diagnosis not present

## 2016-10-10 DIAGNOSIS — Z79899 Other long term (current) drug therapy: Secondary | ICD-10-CM | POA: Diagnosis not present

## 2016-10-10 DIAGNOSIS — C171 Malignant neoplasm of jejunum: Secondary | ICD-10-CM

## 2016-10-10 DIAGNOSIS — R5382 Chronic fatigue, unspecified: Secondary | ICD-10-CM

## 2016-10-10 LAB — CBC WITH DIFFERENTIAL/PLATELET
BASO%: 0.1 % (ref 0.0–2.0)
Basophils Absolute: 0 10*3/uL (ref 0.0–0.1)
EOS%: 0.1 % (ref 0.0–7.0)
Eosinophils Absolute: 0 10*3/uL (ref 0.0–0.5)
HCT: 39.7 % (ref 38.4–49.9)
HGB: 13.1 g/dL (ref 13.0–17.1)
LYMPH%: 9 % — ABNORMAL LOW (ref 14.0–49.0)
MCH: 28.9 pg (ref 27.2–33.4)
MCHC: 33 g/dL (ref 32.0–36.0)
MCV: 87.4 fL (ref 79.3–98.0)
MONO#: 1 10*3/uL — ABNORMAL HIGH (ref 0.1–0.9)
MONO%: 10.2 % (ref 0.0–14.0)
NEUT#: 7.5 10*3/uL — ABNORMAL HIGH (ref 1.5–6.5)
NEUT%: 80.6 % — ABNORMAL HIGH (ref 39.0–75.0)
Platelets: 271 10*3/uL (ref 140–400)
RBC: 4.54 10*6/uL (ref 4.20–5.82)
RDW: 15.5 % — ABNORMAL HIGH (ref 11.0–14.6)
WBC: 9.3 10*3/uL (ref 4.0–10.3)
lymph#: 0.8 10*3/uL — ABNORMAL LOW (ref 0.9–3.3)

## 2016-10-10 LAB — COMPREHENSIVE METABOLIC PANEL
ALT: 22 U/L (ref 0–55)
AST: 22 U/L (ref 5–34)
Albumin: 3.5 g/dL (ref 3.5–5.0)
Alkaline Phosphatase: 92 U/L (ref 40–150)
Anion Gap: 12 mEq/L — ABNORMAL HIGH (ref 3–11)
BUN: 13.2 mg/dL (ref 7.0–26.0)
CO2: 25 mEq/L (ref 22–29)
Calcium: 10.2 mg/dL (ref 8.4–10.4)
Chloride: 103 mEq/L (ref 98–109)
Creatinine: 0.7 mg/dL (ref 0.7–1.3)
EGFR: >90 mL/min/{1.73_m2} (ref >90)
Glucose: 264 mg/dl — ABNORMAL HIGH (ref 70–140)
Potassium: 4.7 mEq/L (ref 3.5–5.1)
Sodium: 140 mEq/L (ref 136–145)
Total Bilirubin: 0.27 mg/dL (ref 0.20–1.20)
Total Protein: 7.1 g/dL (ref 6.4–8.3)

## 2016-10-10 LAB — TSH: TSH: 1.39 m(IU)/L (ref 0.320–4.118)

## 2016-10-10 MED ORDER — SODIUM CHLORIDE 0.9% FLUSH
10.0000 mL | INTRAVENOUS | Status: DC | PRN
Start: 2016-10-10 — End: 2016-10-10
  Administered 2016-10-10: 10 mL
  Filled 2016-10-10: qty 10

## 2016-10-10 MED ORDER — SODIUM CHLORIDE 0.9 % IV SOLN
200.0000 mg | Freq: Once | INTRAVENOUS | Status: AC
  Administered 2016-10-10: 200 mg via INTRAVENOUS
  Filled 2016-10-10: qty 8

## 2016-10-10 MED ORDER — SODIUM CHLORIDE 0.9 % IV SOLN
Freq: Once | INTRAVENOUS | Status: AC
  Administered 2016-10-10: 12:00:00 via INTRAVENOUS

## 2016-10-10 MED ORDER — HEPARIN SOD (PORK) LOCK FLUSH 100 UNIT/ML IV SOLN
500.0000 [IU] | Freq: Once | INTRAVENOUS | Status: AC | PRN
  Administered 2016-10-10: 500 [IU]
  Filled 2016-10-10: qty 5

## 2016-10-10 NOTE — Patient Instructions (Signed)
[facility] Discharge Instructions for Patients Receiving Chemotherapy  Today you received the following chemotherapy agents:  Keytruda  To help prevent nausea and vomiting after your treatment, we encourage you to take your nausea medication as prescribed.   If you develop nausea and vomiting that is not controlled by your nausea medication, call the clinic.   BELOW ARE SYMPTOMS THAT SHOULD BE REPORTED IMMEDIATELY:  *FEVER GREATER THAN 100.5 F  *CHILLS WITH OR WITHOUT FEVER  NAUSEA AND VOMITING THAT IS NOT CONTROLLED WITH YOUR NAUSEA MEDICATION  *UNUSUAL SHORTNESS OF BREATH  *UNUSUAL BRUISING OR BLEEDING  TENDERNESS IN MOUTH AND THROAT WITH OR WITHOUT PRESENCE OF ULCERS  *URINARY PROBLEMS  *BOWEL PROBLEMS  UNUSUAL RASH Items with * indicate a potential emergency and should be followed up as soon as possible.  Feel free to call the clinic you have any questions or concerns. The clinic phone number is [phone].  Please show the [facility] at check-in to the Emergency Department and triage nurse.

## 2016-10-10 NOTE — Telephone Encounter (Signed)
Gave avs and calendar for October and November

## 2016-10-10 NOTE — Progress Notes (Signed)
[place] Telephone:([phone]) [phone]   [phone]) [phone]  OFFICE PROGRESS NOTE  [name], MD [address] [place] [address]  DIAGNOSIS: Stage IV (T1b, N0, M1b) non-small cell lung cancer, adenocarcinoma presented with right upper lobe lung nodule and recent metastasis to the small intestine. This was initially diagnosed in September 2017.  Genomic Alterations Identified? ERBB2 amplification - equivocal? CDKN2A p16INK4a E88* and p14ARF P382N SMARCA4 splice site [id number]>FX SPTA1 E2022* TOP2A amplification TP53 A159P Additional Findings? Microsatellite status MS-Stable Tumor Mutation Burden TMB-Intermediate; 18 Muts/Mb Additional Disease-relevant Genes with No Reportable Alterations Identified? EGFR KRAS ALK BRAF MET RET ROS1   PRIOR THERAPY:  1) Status post right VATS with right upper lobectomy and mediastinal lymph node dissection under the care of Dr. Roxan Hockey on 10/18/2015 and the final pathology was consistent with stage IA (T1b, N0, MX). 2) upper endoscopy on 01/05/2016 showed normal esophagus, normal stomach but there was occasional mass around 3.0 CM in length circumferential nonobstructing in the jejunum. The final pathology was consistent with metastatic adenocarcinoma. 3) status post laparoscopic laparotomy and resection of proximal lesion and and distal jejunum/proximal ileum under the care of Dr. Hassell Done 1 02/27/2016. 3)  Systemic chemotherapy with carboplatin for AUC of 5 and Alimta 500 MG/M2 every 3 weeks. First dose 04/04/2016. Status post 2 cycles. Last dose was given 04/21/2016 discontinued secondary to disease progression.   CURRENT THERAPY: Second line immunotherapy with Ketruda 200 mg IV every 2 weeks, first dose 05/30/2016. Status post 6 cycles.  INTERVAL HISTORY: Brett [name] 56 y.o. male returns to the clinic today for follow-up visit accompanied by his wife. The patient continues to tolerate the treatment with  immunotherapy with [place] fairly well. He denied having any recent weight loss or night sweats. He actually gained 3 pounds since his last visit. He denied having any chest pain, shortness of breath, cough or hemoptysis. He has no fever or chills. He denied having any headache or visual changes. He continues to have mild pain in the neck area and he is scheduled to have MRI of the neck but this was not scheduled yet. He had repeat CT scan of the chest, abdomen and pelvis performed recently and he is here for evaluation and discussion of his scan results.  MEDICAL HISTORY: Past Medical History:  Diagnosis Date  . Anemia 11/28/2015  . Anxiety   . Arthritis    "hands, back" (11/28/2015)  . Barrett's esophagus 07/01/2013   Without dysplasia on biopsy 09/03/2012. Repeat EGD recommended 08/2015  . Carotid artery stenosis 07/01/2013   Requiring right sided stent   . Chronic pain syndrome 07/01/2013  . Closed head injury with brief loss of consciousness ([place]) 07/22/2010   Head trapped in a hydraulic device at work.  Fracture of orbital bones on right and brief loss of consciousness per report.  . 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 There was likely a strong psycho-social overlay in regard to the cognitive deficits in the form of mood disorder with psychotic features and mixed anxiety symptomatology. His primary tested cognitive deficits are in the areas of attention, executi  . Daily headache "since 07/2010"   constantly  . Degenerative joint disease of cervical spine 07/01/2013  . Dehydration 04/11/2016  . Dupuytren's contracture of both hands 04/08/2014  . Encounter for antineoplastic chemotherapy 10/05/2015  . Encounter for antineoplastic immunotherapy  05/24/2016  . Erectile dysfunction associated with type 2 diabetes mellitus ([place]) 07/01/2013  . Fibromyalgia  07/01/2013  . Goals of care, counseling/discussion 03/28/2016  . History of blood transfusion 11/28/2015   "suppose to get his first today" (11/28/2015)  . Hyperlipidemia LDL goal < 100 07/01/2013  . Intractable hiccups 04/11/2016  . 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 Memory changes    "memory issues" from head injury  . Osteoarthritis of right thumb 10/21/2014  . Peripheral vascular occlusive disease ([place]) 07/01/2013   Requiring 2 arterial stents above the left knee per report  . Pneumonia ~ 2006/2007  . Post traumatic stress disorder 07/01/2013  . Primary lung adenocarcinoma ([place]) dx'd 08/2015   "right lung"  . Severe major depression with psychotic features ([place]) 04/15/2011  . Tobacco abuse 07/01/2013  . Tobacco abuse   . Type 2 diabetes mellitus with vascular disease ([place]) 07/01/2013   Left lower extremity and right carotid stenting    ALLERGIES:  is allergic to gabapentin; lyrica [pregabalin]; celebrex [celecoxib]; and contrast media [iodinated diagnostic agents].  MEDICATIONS:  Current Outpatient Prescriptions  Medication Sig Dispense Refill  . aspirin EC 81 MG tablet Take 81 mg by mouth daily.     Marland Kitchen atorvastatin (LIPITOR) 40 MG tablet Take 1 tablet (40 mg total) by mouth daily. 90 tablet 3  . clopidogrel (PLAVIX) 75 MG tablet Take 75 mg by mouth at bedtime.     . diphenhydrAMINE (BENADRYL) 25 mg capsule Take 2 capsules (50 mg total) by mouth as directed. Take prior to CT scan as directed 30 capsule 0  . dronabinol (MARINOL) 2.5 MG capsule Take 1 capsule (2.5 mg total) by mouth 2 (two) times daily before a meal. 60 capsule 0  . Ferrous Sulfate (IRON) 325 (65 Fe) MG TABS Take 325 mg by mouth 2 (two) times daily with a meal. 120 each 0  . lidocaine-prilocaine (EMLA) cream Apply 1 application topically as needed. 30 g 0  . lisinopril (PRINIVIL,ZESTRIL) 5 MG tablet Take 0.5 tablets (2.5 mg total) by mouth daily. 90 tablet 3  . metFORMIN (GLUCOPHAGE) 500 MG tablet Take 1 tablet (500 mg  total) by mouth 2 (two) times daily with a meal. 180 tablet 3  . metoCLOPramide (REGLAN) 10 MG tablet TAKE 1 TABLET BY MOUTH FOUR TIMES DAILY AS NEEDED AS DIRECTED 30 tablet 0  . ondansetron (ZOFRAN) 4 MG tablet Take 1 tablet (4 mg total) by mouth 3 (three) times daily. 20 tablet 0  . oxyCODONE-acetaminophen (PERCOCET) 10-325 MG tablet Take 1-2 tablets by mouth every 6 (six) hours as needed for pain. 240 tablet 0  . pantoprazole (PROTONIX) 40 MG tablet Take 1 tablet (40 mg total) by mouth daily. 90 tablet 3  . predniSONE (DELTASONE) 50 MG tablet Take 1 tablet (50 mg total) by mouth as directed. Take 50 mg 13 hours. 7 hours and 1 hour prior to scan 3 tablet 2  . prochlorperazine (COMPAZINE) 10 MG tablet Take 1 tablet (10 mg total) by mouth every 6 (six) hours as needed for nausea or vomiting. 30 tablet 0  . prochlorperazine (COMPAZINE) 25 MG suppository Place 1 suppository (25 mg total) rectally every 12 (twelve) hours as needed for refractory nausea / vomiting. 12 suppository 11  . senna-docusate (SENOKOT-S) 8.6-50 MG tablet Take 1-2 tablets by mouth 2 (two) times daily as needed for mild constipation or moderate constipation. 30 tablet 1  . sitaGLIPtin (JANUVIA) 100 MG tablet Take 1 tablet (100 mg total) by mouth daily. [place]  tablet 3  . sorbitol 70 % solution Take 15 cc every 4 hours until bowel movement 473 mL 0   No current facility-administered medications for this visit.     SURGICAL HISTORY:  Past Surgical History:  Procedure Laterality Date  . CAROTID STENT Right ?2014  . COLONOSCOPY N/A 11/30/2015   Procedure: COLONOSCOPY;  Surgeon: Teena Irani, MD;  Location: [facility] ENDOSCOPY;  Service: Endoscopy;  Laterality: N/A;  . ESOPHAGOGASTRODUODENOSCOPY N/A 11/30/2015   Procedure: ESOPHAGOGASTRODUODENOSCOPY (EGD);  Surgeon: Teena Irani, MD;  Location: [facility] ENDOSCOPY;  Service: Endoscopy;  Laterality: N/A;  . ESOPHAGOGASTRODUODENOSCOPY (EGD) WITH PROPOFOL N/A 01/05/2016   Procedure:  ESOPHAGOGASTRODUODENOSCOPY (EGD) WITH PROPOFOL;  Surgeon: Teena Irani, MD;  Location: [facility] ENDOSCOPY;  Service: Endoscopy;  Laterality: N/A;  . FEMORAL ARTERY STENT Left 05/2012; ~ 2015   Archie Endo 06/04/2012; Raechel Chute report  . FRACTURE SURGERY    . GIVENS CAPSULE STUDY N/A 12/22/2015   Procedure: GIVENS CAPSULE STUDY;  Surgeon: Wonda Horner, MD;  Location: [facility] ENDOSCOPY;  Service: Endoscopy;  Laterality: N/A;  . HARDWARE REMOVAL Right 11/15/2011   Removal of deep frontozygomatic orbital hardware/notes 11/15/2011  . HERNIA REPAIR  [id number]   Umbilical  . LAPAROSCOPY N/A 02/27/2016   Procedure: LAPAROSCOPY, LAPAROTOMY  WITH TWO SMALL BOWEL RESECTION;  Surgeon: Johnathan Hausen, MD;  Location: [facility] ORS;  Service: General;  Laterality: N/A;  . ORIF ORBITAL FRACTURE Right 08/15/2010    caught in a hydraulic machine; open reduction internal fixation of orbital rim fracture and open reduction of zygomatic arch fracture  Archie Endo 10/13/2009  . PORTACATH PLACEMENT Left 04/04/2016   Procedure: INSERTION PORT-A-CATH LEFT CHEST;  Surgeon: Melrose Nakayama, MD;  Location: [place];  Service: Thoracic;  Laterality: Left;  Marland Kitchen VIDEO ASSISTED THORACOSCOPY (VATS)/ LOBECTOMY Right 10/18/2015   Procedure: VIDEO ASSISTED THORACOSCOPY (VATS)/ LOBECTOMY;  Surgeon: Melrose Nakayama, MD;  Location: [place];  Service: Thoracic;  Laterality: Right;  Marland Kitchen VIDEO BRONCHOSCOPY Bilateral 09/21/2015   Procedure: VIDEO BRONCHOSCOPY WITH FLUORO;  Surgeon: Juanito Doom, MD;  Location: [facility] ENDOSCOPY;  Service: Cardiopulmonary;  Laterality: Bilateral;    REVIEW OF SYSTEMS:  Constitutional: negative Eyes: negative Ears, nose, mouth, throat, and face: negative Respiratory: negative Cardiovascular: negative Gastrointestinal: negative Genitourinary:negative Integument/breast: negative Hematologic/lymphatic: negative Musculoskeletal:positive for neck pain Neurological: negative Behavioral/Psych: negative Endocrine:  negative Allergic/Immunologic: negative   PHYSICAL EXAMINATION: General appearance: alert, cooperative and no distress Head: Normocephalic, without obvious abnormality, atraumatic Neck: no adenopathy, no JVD, supple, symmetrical, trachea midline and thyroid not enlarged, symmetric, no tenderness/mass/nodules Lymph nodes: Cervical, supraclavicular, and axillary nodes normal. Resp: clear to auscultation bilaterally Back: symmetric, no curvature. ROM normal. No CVA tenderness. Cardio: regular rate and rhythm, S1, S2 normal, no murmur, click, rub or gallop GI: soft, non-tender; bowel sounds normal; no masses,  no organomegaly Extremities: extremities normal, atraumatic, no cyanosis or edema Neurologic: Alert and oriented X 3, normal strength and tone. Normal symmetric reflexes. Normal coordination and gait  ECOG PERFORMANCE STATUS: 1 - Symptomatic but completely ambulatory  Blood pressure 110/73, pulse 71, temperature 97.9 F (36.6 C), temperature source Oral, resp. rate 18, weight 115 lb 4.8 oz (52.3 kg), SpO2 100 %.  LABORATORY DATA: Lab Results  Component Value Date   WBC 9.3 10/10/2016   HGB 13.1 10/10/2016   HCT 39.7 10/10/2016   MCV 87.4 10/10/2016   PLT 271 10/10/2016      Chemistry      Component Value Date/Time   NA 140 10/10/2016 1015   K 4.7  10/10/2016 1015   CL 103 04/04/2016 1206   CO2 25 10/10/2016 1015   BUN 13.2 10/10/2016 1015   CREATININE 0.7 10/10/2016 1015   GLU 398 (H) 08/08/2016 1257      Component Value Date/Time   CALCIUM 10.2 10/10/2016 1015   ALKPHOS 92 10/10/2016 1015   AST 22 10/10/2016 1015   ALT 22 10/10/2016 1015   BILITOT 0.27 10/10/2016 1015       RADIOGRAPHIC STUDIES: Ct Chest W Contrast  Result Date: 10/10/2016 CLINICAL DATA:  Non-small cell lung cancer, metastatic.  Restaging. EXAM: CT CHEST, ABDOMEN, AND PELVIS WITH CONTRAST TECHNIQUE: Multidetector CT imaging of the chest, abdomen and pelvis was performed following the standard  protocol during bolus administration of intravenous contrast. The patient received standard 13 hour contrast allergy prep. CONTRAST:  16m ISOVUE-300 IOPAMIDOL (ISOVUE-300) INJECTION 61% COMPARISON:  Multiple exams, including 08/08/2016 FINDINGS: CT CHEST FINDINGS Cardiovascular: Left Port-A-Cath tip: SVC. Coronary, aortic arch, and branch vessel atherosclerotic vascular disease. Mediastinum/Nodes: Contrast medium in the esophagus compatible with dysmotility or reflux. Lungs/Pleura: Right upper lobectomy. Minimal tree-in-bud reticulonodular opacity in the left upper lobe on image 39/4, most characteristic of a small area of atypical infectious bronchiolitis or airway plugging. Musculoskeletal: Thoracic spondylosis. CT ABDOMEN PELVIS FINDINGS Hepatobiliary: Mildly contracted gallbladder. Otherwise unremarkable. Pancreas: Unremarkable Spleen: Unremarkable Adrenals/Urinary Tract: Unremarkable Stomach/Bowel: Postoperative findings along right-sided small bowel including a chronic mildly dilated segment along the anastomotic staple line age is likely incidental. Appendix normal. Orally administered contrast extends through the colon to the rectum. Vascular/Lymphatic: Aortoiliac atherosclerotic vascular disease. Severe stenosis and near occlusion of the left external iliac artery on image 100/2, chronic. Left periaortic lymph node referenced on the prior exam measures 0.7 cm in short axis on image 66/2, formerly 1.2 cm. A left external iliac node on image 107/ 2 measures 0.8 cm in short axis, formerly 0.9 cm. A left common iliac node measures 0.5 cm in short axis on image 89/2, previously 0.6 cm. There is subtle mesenteric stranding with some associated small mesenteric lymph nodes, but of reduced conspicuity compared to the prior exam, shown today on images 67 through seventy-three of series 2. Reproductive: Dense focal calcification in the right central prostate gland, stable. Otherwise unremarkable. Other: No  supplemental non-categorized findings. Musculoskeletal: Lumbar spondylosis and degenerative disc disease, causing bilateral foraminal impingement at the L4-5 level. IMPRESSION: 1. Continued improvement, with further resolution of the retroperitoneal and mesenteric adenopathy compared to prior exams. Currently there is no residual overtly pathologically enlarged adenopathy. 2. Minimal tree-in-bud reticulonodular opacity in the left upper lobe compatible with atypical infectious bronchiolitis or mild airway plugging. 3. Aortic Atherosclerosis (ICD10-I70.0). Near occlusion of the left external iliac artery due to atheromatous stenosis. Coronary atherosclerosis. 4. Other imaging findings of potential clinical significance: Right upper lobectomy. Postoperative findings along right-sided small bowel. Lumbar spondylosis and degenerative disc disease causing foraminal impingement at L4-5. Electronically Signed   By: WVan ClinesM.D.   On: 10/10/2016 10:35   Ct Abdomen Pelvis W Contrast  Result Date: 10/10/2016 CLINICAL DATA:  Non-small cell lung cancer, metastatic.  Restaging. EXAM: CT CHEST, ABDOMEN, AND PELVIS WITH CONTRAST TECHNIQUE: Multidetector CT imaging of the chest, abdomen and pelvis was performed following the standard protocol during bolus administration of intravenous contrast. The patient received standard 13 hour contrast allergy prep. CONTRAST:  1090mISOVUE-300 IOPAMIDOL (ISOVUE-300) INJECTION 61% COMPARISON:  Multiple exams, including 08/08/2016 FINDINGS: CT CHEST FINDINGS Cardiovascular: Left Port-A-Cath tip: SVC. Coronary, aortic arch, and branch vessel atherosclerotic vascular  disease. Mediastinum/Nodes: Contrast medium in the esophagus compatible with dysmotility or reflux. Lungs/Pleura: Right upper lobectomy. Minimal tree-in-bud reticulonodular opacity in the left upper lobe on image 39/4, most characteristic of a small area of atypical infectious bronchiolitis or airway plugging.  Musculoskeletal: Thoracic spondylosis. CT ABDOMEN PELVIS FINDINGS Hepatobiliary: Mildly contracted gallbladder. Otherwise unremarkable. Pancreas: Unremarkable Spleen: Unremarkable Adrenals/Urinary Tract: Unremarkable Stomach/Bowel: Postoperative findings along right-sided small bowel including a chronic mildly dilated segment along the anastomotic staple line age is likely incidental. Appendix normal. Orally administered contrast extends through the colon to the rectum. Vascular/Lymphatic: Aortoiliac atherosclerotic vascular disease. Severe stenosis and near occlusion of the left external iliac artery on image 100/2, chronic. Left periaortic lymph node referenced on the prior exam measures 0.7 cm in short axis on image 66/2, formerly 1.2 cm. A left external iliac node on image 107/ 2 measures 0.8 cm in short axis, formerly 0.9 cm. A left common iliac node measures 0.5 cm in short axis on image 89/2, previously 0.6 cm. There is subtle mesenteric stranding with some associated small mesenteric lymph nodes, but of reduced conspicuity compared to the prior exam, shown today on images 67 through seventy-three of series 2. Reproductive: Dense focal calcification in the right central prostate gland, stable. Otherwise unremarkable. Other: No supplemental non-categorized findings. Musculoskeletal: Lumbar spondylosis and degenerative disc disease, causing bilateral foraminal impingement at the L4-5 level. IMPRESSION: 1. Continued improvement, with further resolution of the retroperitoneal and mesenteric adenopathy compared to prior exams. Currently there is no residual overtly pathologically enlarged adenopathy. 2. Minimal tree-in-bud reticulonodular opacity in the left upper lobe compatible with atypical infectious bronchiolitis or mild airway plugging. 3. Aortic Atherosclerosis (ICD10-I70.0). Near occlusion of the left external iliac artery due to atheromatous stenosis. Coronary atherosclerosis. 4. Other imaging findings of  potential clinical significance: Right upper lobectomy. Postoperative findings along right-sided small bowel. Lumbar spondylosis and degenerative disc disease causing foraminal impingement at L4-5. Electronically Signed   By: Van Clines M.D.   On: 10/10/2016 10:35     ASSESSMENT AND PLAN:  This is a very pleasant 56 years old white male with metastatic non-small cell lung cancer, adenocarcinoma status post right upper lobectomy with lymph node dissection. Unfortunately the patient was found to have metastatic disease in the jejunum and terminal ileum.  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 The patient was started on treatment with systemic chemotherapy with carboplatin and Alimta for 2 cycles discontinued secondary to intolerance and disease progression. The patient is currently undergoing treatment with second line immunotherapy with Ketruda (pembrolizumab) 200 mg IV every 3 weeks, status post 6 cycles.  He continues to tolerate this treatment fairly well.  He had repeat CT scan of the chest, abdomen and pelvis performed recently. I personally and independently reviewed the scans ad discuss the results with the patient and his wife. His scan showed continuous improvement with further resolution of the retroperitoneal and mesenteric adenopathy compared to the previous exams.  I recommended for the patient to continue his current treatment with [place] and the patient will proceed with cycle #7 today.  I will see him back for follow-up visit in 3 weeks for evaluation before starting cycle #8.  He was advised to call immediately if he has any concerning symptoms in the interval. The patient voices understanding of current disease status and treatment options and is in agreement with the current care plan. All  questions were answered. The patient knows to  call the clinic with any problems, questions or concerns. We can certainly see the patient much sooner if necessary.  Disclaimer: This note was dictated with voice recognition software. Similar sounding words can inadvertently be transcribed and may not be corrected upon review.

## 2016-10-17 ENCOUNTER — Other Ambulatory Visit: Payer: Self-pay | Admitting: Internal Medicine

## 2016-10-17 DIAGNOSIS — M542 Cervicalgia: Secondary | ICD-10-CM

## 2016-10-24 ENCOUNTER — Ambulatory Visit (HOSPITAL_COMMUNITY)
Admission: RE | Admit: 2016-10-24 | Discharge: 2016-10-24 | Disposition: A | Discharge status: Home / Self Care | Payer: Medicare Other | Source: Ambulatory Visit | Attending: Internal Medicine | Admitting: Internal Medicine

## 2016-10-24 DIAGNOSIS — M129 Arthropathy, unspecified: Secondary | ICD-10-CM | POA: Diagnosis not present

## 2016-10-24 DIAGNOSIS — M542 Cervicalgia: Secondary | ICD-10-CM | POA: Insufficient documentation

## 2016-10-24 DIAGNOSIS — S199XXA Unspecified injury of neck, initial encounter: Secondary | ICD-10-CM | POA: Diagnosis not present

## 2016-10-25 ENCOUNTER — Ambulatory Visit (INDEPENDENT_AMBULATORY_CARE_PROVIDER_SITE_OTHER): Payer: Medicare Other | Admitting: Internal Medicine

## 2016-10-25 ENCOUNTER — Encounter: Payer: Self-pay | Admitting: Internal Medicine

## 2016-10-25 VITALS — BP 106/79 | HR 106 | Temp 97.8°F | Wt 115.9 lb

## 2016-10-25 DIAGNOSIS — S069X1A Unspecified intracranial injury with loss of consciousness of 30 minutes or less, initial encounter: Secondary | ICD-10-CM

## 2016-10-25 DIAGNOSIS — F1190 Opioid use, unspecified, uncomplicated: Secondary | ICD-10-CM | POA: Diagnosis not present

## 2016-10-25 DIAGNOSIS — Z0000 Encounter for general adult medical examination without abnormal findings: Secondary | ICD-10-CM

## 2016-10-25 DIAGNOSIS — G894 Chronic pain syndrome: Secondary | ICD-10-CM | POA: Diagnosis not present

## 2016-10-25 DIAGNOSIS — F17200 Nicotine dependence, unspecified, uncomplicated: Secondary | ICD-10-CM

## 2016-10-25 DIAGNOSIS — Z79891 Long term (current) use of opiate analgesic: Secondary | ICD-10-CM | POA: Diagnosis not present

## 2016-10-25 DIAGNOSIS — S069X9A Unspecified intracranial injury with loss of consciousness of unspecified duration, initial encounter: Secondary | ICD-10-CM

## 2016-10-25 DIAGNOSIS — C3491 Malignant neoplasm of unspecified part of right bronchus or lung: Secondary | ICD-10-CM

## 2016-10-25 DIAGNOSIS — E1151 Type 2 diabetes mellitus with diabetic peripheral angiopathy without gangrene: Secondary | ICD-10-CM

## 2016-10-25 DIAGNOSIS — E7800 Pure hypercholesterolemia, unspecified: Secondary | ICD-10-CM

## 2016-10-25 DIAGNOSIS — I739 Peripheral vascular disease, unspecified: Secondary | ICD-10-CM

## 2016-10-25 DIAGNOSIS — S0990XS Unspecified injury of head, sequela: Secondary | ICD-10-CM | POA: Diagnosis not present

## 2016-10-25 DIAGNOSIS — Z720 Tobacco use: Secondary | ICD-10-CM

## 2016-10-25 DIAGNOSIS — E43 Unspecified severe protein-calorie malnutrition: Secondary | ICD-10-CM | POA: Diagnosis not present

## 2016-10-25 DIAGNOSIS — E1159 Type 2 diabetes mellitus with other circulatory complications: Secondary | ICD-10-CM

## 2016-10-25 DIAGNOSIS — E440 Moderate protein-calorie malnutrition: Secondary | ICD-10-CM

## 2016-10-25 LAB — POCT GLYCOSYLATED HEMOGLOBIN (HGB A1C): Hemoglobin A1C: 7.5

## 2016-10-25 LAB — GLUCOSE, CAPILLARY: Glucose-Capillary: 196 mg/dL — ABNORMAL HIGH (ref 65–99)

## 2016-10-25 MED ORDER — CYCLOBENZAPRINE HCL 10 MG PO TABS: 10.0000 mg | ORAL_TABLET | Freq: Three times a day (TID) | ORAL | 11 refills | Status: DC | PRN

## 2016-10-25 MED ORDER — GLIPIZIDE 10 MG PO TABS: 20.0000 mg | ORAL_TABLET | Freq: Two times a day (BID) | ORAL | 3 refills | Status: DC

## 2016-10-25 NOTE — Assessment & Plan Note (Signed)
He will ask about the safety of receiving the flu vaccination while undergoing immunotherapy for his lung adenocarcinoma at his follow-up visit with Dr. Julien Nordmann. If it is felt to be safe, we will provide him with the flu vaccination. When I looked up the safety of the combination it reported there should be no difficulty getting the flu vaccination. Nonetheless, he preferred to clarify this with his oncologist first. We are holding off on evaluating his eyes while he is undergoing immunotherapy for his lung cancer. He is otherwise up-to-date on his health care maintenance.

## 2016-10-25 NOTE — Assessment & Plan Note (Signed)
Assessment  He is tolerating the atorvastatin 40 mg by mouth daily without generalized myalgias.  His current myalgias seem to be localized to his shoulders chronic back pain. Thus, I do not believe these are myalgias related to the statin therapy.  Plan  We will continue the high intensity statin and reassess for generalized myalgias at the follow-up visit.

## 2016-10-25 NOTE — Progress Notes (Signed)
   Subjective:    Patient ID: Brett [name], male    DOB: [birth year], 58 y.o.   MRN: [id number]  HPI  ROBI DEWOLFE is here for follow-up of neck weakness after a fall, closed head injury, diabetes with vascular disease, hyperlipidemia, tobacco abuse, chronic pain syndrome, metastatic lung adenocarcinoma, and malnutrition. Please see the A&P for the status of the pt's chronic medical problems.  Review of Systems  Constitutional: Positive for appetite change and fatigue. Negative for unexpected weight change.       His appetite is slowly improving.  He continues to experience fatigue.  Eyes: Positive for visual disturbance.       He has had some recent blurry vision lately  Respiratory: Negative for chest tightness and shortness of breath.   Cardiovascular: Negative for chest pain and leg swelling.  Gastrointestinal: Positive for constipation. Negative for diarrhea.  Endocrine: Positive for polydipsia and polyuria.  Genitourinary: Positive for frequency.  Musculoskeletal: Positive for arthralgias, back pain and myalgias. Negative for gait problem and joint swelling.       Neck weakness  Skin: Positive for wound. Negative for color change and rash.       Dog scratches on arms  Neurological: Positive for headaches.      Objective:   Physical Exam  Constitutional: He is oriented to person, place, and time. He appears well-developed. No distress.  HENT:  Head: Normocephalic and atraumatic.  Eyes: Conjunctivae are normal. Right eye exhibits no discharge. Left eye exhibits no discharge. No scleral icterus.  Neck: Normal range of motion. Neck supple.  Pulmonary/Chest: No stridor.  Musculoskeletal: Normal range of motion. He exhibits no edema, tenderness or deformity.  Neurological: He is alert and oriented to person, place, and time. He exhibits normal muscle tone.  Skin: Skin is warm and dry. No rash noted. He is not diaphoretic. No erythema.  Linear excoriations on bilateral arms    Psychiatric: He has a normal mood and affect. His behavior is normal. Judgment and thought content normal.  Nursing note and vitals reviewed.     Assessment & Plan:   Please see problem oriented charting.

## 2016-10-25 NOTE — Patient Instructions (Signed)
It was good to see you.  You are doing extremely well given everything.  1) Keep taking the medications as you are.  2) I started cyclobenzaprine 10 mg every 8 hours as needed for pain.  3)  Ask the cancer center if it is OK to get the flu shot.  I will see you in 3 months, sooner if necessary.

## 2016-10-25 NOTE — Assessment & Plan Note (Signed)
Assessment  He states that he continues to have chronic pain in the back, shoulders, and headaches. This responds reasonably well to the as needed Percocet and symptoms remain functional. He also states he gets additional improvement in his symptoms with cyclobenzaprine 10 mg by mouth every 8 hours as needed.  Plan  We will continue the Percocet 10-325 mg 1-2 tablets every 6 hours as needed for pain dispense #240 per month. A urine drug screen was obtained during this visit and is pending at the time of this dictation. A prescription for cyclobenzaprine 10 mg by mouth every 8 hours as needed for muscle spasm was provided. We will reassess the efficacy of this therapy in managing his chronic pain at the follow-up visit.

## 2016-10-25 NOTE — Assessment & Plan Note (Signed)
Assessment  His follow-up CT scans on the immunotherapy Keytruda seem to suggest responsiveness to this intervention.  Plan  He will continue receiving immunotherapy through the cancer center via Dr. Julien Nordmann.

## 2016-10-25 NOTE — Assessment & Plan Note (Signed)
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 These have been stable in severity.  Plan  We will continue the as needed Percocet which should also help alleviate some of his headache. We will reassess the chronic headaches associated with his closed head injury at the follow-up visit.

## 2016-10-25 NOTE — Assessment & Plan Note (Signed)
Assessment  His hemoglobin A1c today is 7.5. This is improved from 8.5 approximately 8 months ago. This is in spite of getting prednisone preparation for his CT scans about every 3 months. His current regimen includes glipizide regular release 20 mg by mouth twice daily, metformin 500 mg by mouth twice daily, and Januvia 100 mg by mouth daily. He is tolerating this regimen well without reports of hypoglycemic episodes. His claudication symptoms are stable as we aggressively try to manage his cardiovascular risk factors.  Plan  We will continue the glipizide regular release 20 mg by mouth twice daily, metformin 500 mg by mouth twice daily, and Januvia 100 mg by mouth daily. We will reassess the hemoglobin A1c at the follow-up visit. Our target is less than 8.0 at this point. We will continue to aggressively manage his cardiovascular risk factors including his diabetes, hypertension, and hyperlipidemia.

## 2016-10-25 NOTE — Assessment & Plan Note (Signed)
Assessment  His claudication is stable at this time.  Plan  We will continue to aggressively treat his diabetes, hypertension, and hyperlipidemia. He was once again advised on the importance of tobacco cessation for multiple reasons. Unfortunately, at this time he is not ready to quit after recently restarting.

## 2016-10-25 NOTE — Assessment & Plan Note (Signed)
Assessment  He has gained 6 pounds since the last clinic visit and is just under 116 pounds at this time. This is occurred with the improvement in his appetite.  Plan  He was praised for the weight gain and encouraged to continue to eat as much as he can in order to re-plan-his muscle mass. We will reassess his weight at the follow-up visit.

## 2016-10-25 NOTE — Assessment & Plan Note (Signed)
Assessment  Unfortunately he recently restarted smoking.  Plan  We discussed the multiple reasons why smoking is not in his best interests. At this point, he states he is not ready to quit smoking. We will reassess if he has moved beyond the pre-contemplative stage at the follow-up visit.

## 2016-10-31 ENCOUNTER — Other Ambulatory Visit (HOSPITAL_BASED_OUTPATIENT_CLINIC_OR_DEPARTMENT_OTHER): Payer: Medicare Other

## 2016-10-31 ENCOUNTER — Ambulatory Visit (HOSPITAL_BASED_OUTPATIENT_CLINIC_OR_DEPARTMENT_OTHER): Payer: Medicare Other

## 2016-10-31 ENCOUNTER — Ambulatory Visit (HOSPITAL_BASED_OUTPATIENT_CLINIC_OR_DEPARTMENT_OTHER): Payer: Medicare Other | Admitting: Internal Medicine

## 2016-10-31 ENCOUNTER — Encounter: Payer: Self-pay | Admitting: Internal Medicine

## 2016-10-31 VITALS — BP 120/79 | HR 117 | Temp 97.7°F | Resp 18 | Ht 67.0 in | Wt 113.2 lb

## 2016-10-31 DIAGNOSIS — C171 Malignant neoplasm of jejunum: Secondary | ICD-10-CM

## 2016-10-31 DIAGNOSIS — C3491 Malignant neoplasm of unspecified part of right bronchus or lung: Secondary | ICD-10-CM

## 2016-10-31 DIAGNOSIS — C784 Secondary malignant neoplasm of small intestine: Secondary | ICD-10-CM

## 2016-10-31 DIAGNOSIS — C3411 Malignant neoplasm of upper lobe, right bronchus or lung: Secondary | ICD-10-CM

## 2016-10-31 DIAGNOSIS — R5382 Chronic fatigue, unspecified: Secondary | ICD-10-CM

## 2016-10-31 DIAGNOSIS — Z79899 Other long term (current) drug therapy: Secondary | ICD-10-CM | POA: Diagnosis not present

## 2016-10-31 DIAGNOSIS — Z5112 Encounter for antineoplastic immunotherapy: Secondary | ICD-10-CM

## 2016-10-31 LAB — COMPREHENSIVE METABOLIC PANEL
ALT: 25 U/L (ref 0–55)
AST: 29 U/L (ref 5–34)
Albumin: 3.7 g/dL (ref 3.5–5.0)
Alkaline Phosphatase: 85 U/L (ref 40–150)
Anion Gap: 10 mEq/L (ref 3–11)
BUN: 7.8 mg/dL (ref 7.0–26.0)
CO2: 27 mEq/L (ref 22–29)
Calcium: 9.6 mg/dL (ref 8.4–10.4)
Chloride: 105 mEq/L (ref 98–109)
Creatinine: 0.7 mg/dL (ref 0.7–1.3)
EGFR: >60 mL/min/{1.73_m2} (ref >60)
Glucose: 68 mg/dl — ABNORMAL LOW (ref 70–140)
Potassium: 4.6 mEq/L (ref 3.5–5.1)
Sodium: 142 mEq/L (ref 136–145)
Total Bilirubin: 0.31 mg/dL (ref 0.20–1.20)
Total Protein: 7.2 g/dL (ref 6.4–8.3)

## 2016-10-31 LAB — CBC WITH DIFFERENTIAL/PLATELET
BASO%: 0.3 % (ref 0.0–2.0)
Basophils Absolute: 0 10*3/uL (ref 0.0–0.1)
EOS%: 3.3 % (ref 0.0–7.0)
Eosinophils Absolute: 0.2 10*3/uL (ref 0.0–0.5)
HCT: 45 % (ref 38.4–49.9)
HGB: 15 g/dL (ref 13.0–17.1)
LYMPH%: 16.7 % (ref 14.0–49.0)
MCH: 29.8 pg (ref 27.2–33.4)
MCHC: 33.3 g/dL (ref 32.0–36.0)
MCV: 89.3 fL (ref 79.3–98.0)
MONO#: 0.8 10*3/uL (ref 0.1–0.9)
MONO%: 11.1 % (ref 0.0–14.0)
NEUT#: 4.6 10*3/uL (ref 1.5–6.5)
NEUT%: 68.6 % (ref 39.0–75.0)
Platelets: 249 10*3/uL (ref 140–400)
RBC: 5.04 10*6/uL (ref 4.20–5.82)
RDW: 15.3 % — ABNORMAL HIGH (ref 11.0–14.6)
WBC: 6.8 10*3/uL (ref 4.0–10.3)
lymph#: 1.1 10*3/uL (ref 0.9–3.3)

## 2016-10-31 LAB — TSH: TSH: 2.383 m(IU)/L (ref 0.320–4.118)

## 2016-10-31 LAB — TOXASSURE SELECT,+ANTIDEPR,UR

## 2016-10-31 MED ORDER — SODIUM CHLORIDE 0.9 % IV SOLN
200.0000 mg | Freq: Once | INTRAVENOUS | Status: AC
  Administered 2016-10-31: 200 mg via INTRAVENOUS
  Filled 2016-10-31: qty 8

## 2016-10-31 MED ORDER — SODIUM CHLORIDE 0.9 % IV SOLN
Freq: Once | INTRAVENOUS | Status: AC
  Administered 2016-10-31: 11:00:00 via INTRAVENOUS

## 2016-10-31 MED ORDER — SODIUM CHLORIDE 0.9% FLUSH
10.0000 mL | INTRAVENOUS | Status: DC | PRN
  Administered 2016-10-31: 10 mL
  Filled 2016-10-31: qty 10

## 2016-10-31 MED ORDER — HEPARIN SOD (PORK) LOCK FLUSH 100 UNIT/ML IV SOLN
500.0000 [IU] | Freq: Once | INTRAVENOUS | Status: AC | PRN
  Administered 2016-10-31: 500 [IU]
  Filled 2016-10-31: qty 5

## 2016-10-31 NOTE — Progress Notes (Signed)
[facility] Telephone:([phone]) [phone]   Fax:([phone]  OFFICE PROGRESS NOTE  [name], MD [address] [place] [address]  DIAGNOSIS: Stage IV (T1b, N0, M1b) non-small cell lung cancer, adenocarcinoma presented with right upper lobe lung nodule and recent metastasis to the small intestine. This was initially diagnosed in September 2017.  Genomic Alterations Identified? ERBB2 amplification - equivocal? CDKN2A p16INK4a E88* and p14ARF W413K SMARCA4 splice site [id number] SPTA1 E2022* TOP2A amplification TP53 A159P Additional Findings? Microsatellite status MS-Stable Tumor Mutation Burden TMB-Intermediate; 18 Muts/Mb Additional Disease-relevant Genes with No Reportable Alterations Identified? EGFR KRAS ALK BRAF MET RET ROS1   PRIOR THERAPY:  1) Status post right VATS with right upper lobectomy and mediastinal lymph node dissection under the care of Dr. Roxan Hockey on 10/18/2015 and the final pathology was consistent with stage IA (T1b, N0, MX). 2) upper endoscopy on 01/05/2016 showed normal esophagus, normal stomach but there was occasional mass around 3.0 CM in length circumferential nonobstructing in the jejunum. The final pathology was consistent with metastatic adenocarcinoma. 3) status post laparoscopic laparotomy and resection of proximal lesion and and distal jejunum/proximal ileum under the care of Dr. Hassell Done 1 02/27/2016. 3)  Systemic chemotherapy with carboplatin for AUC of 5 and Alimta 500 MG/M2 every 3 weeks. First dose 04/04/2016. Status post 2 cycles. Last dose was given 04/21/2016 discontinued secondary to disease progression.   CURRENT THERAPY: Second line immunotherapy with Ketruda 200 mg IV every 2 weeks, first dose 05/30/2016. Status post 7 cycles.  INTERVAL HISTORY: Brett [name] 56 y.o. male returns to the clinic today for follow-up visit accompanied by his wife. The patient is feeling fine today was no specific  complaints. He continues to tolerate his treatment with Nat Math fairly well. He denied having any chest pain, shortness breath, cough or hemoptysis. He denied having any fever or chills. He has no nausea, vomiting, diarrhea or constipation. The patient denied having any recent weight loss or night sweats. He is here today for evaluation before starting cycle #8.  MEDICAL HISTORY: Past Medical History:  Diagnosis Date  . Anemia 11/28/2015  . Anxiety   . Arthritis    "hands, back" (11/28/2015)  . Barrett's esophagus 07/01/2013   Without dysplasia on biopsy 09/03/2012. Repeat EGD recommended 08/2015  . Carotid artery stenosis 07/01/2013   Requiring right sided stent   . Chronic pain syndrome 07/01/2013  . Closed head injury with brief loss of consciousness ([place]) 07/22/2010   Head trapped in a hydraulic device at work.  Fracture of orbital bones on right and brief loss of consciousness per report.  . 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 There was likely a strong psycho-social overlay in regard to the cognitive deficits in the form of mood disorder with psychotic features and mixed anxiety symptomatology. His primary tested cognitive deficits are in the areas of attention, executi  . Daily headache "since 07/2010"   constantly  . Degenerative joint disease of cervical spine 07/01/2013  . Dehydration 04/11/2016  . Dupuytren's contracture of both hands 04/08/2014  . Encounter for antineoplastic chemotherapy 10/05/2015  . Encounter for antineoplastic immunotherapy 05/24/2016  . Erectile dysfunction associated with type 2 diabetes mellitus ([place]) 07/01/2013  . Fibromyalgia 07/01/2013  . Goals of care, counseling/discussion 03/28/2016  . History of blood transfusion 11/28/2015   "suppose to get his first today" (11/28/2015)  .  Hyperlipidemia LDL goal < 100 07/01/2013  .  Intractable hiccups 04/11/2016  . 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 Memory changes    "memory issues" from head injury  . Moderate protein-calorie malnutrition ([place]) 11/29/2015  . Osteoarthritis of right thumb 10/21/2014  . Peripheral vascular occlusive disease ([place]) 07/01/2013   Requiring 2 arterial stents above the left knee per report  . Pneumonia ~ 2006/2007  . Post traumatic stress disorder 07/01/2013  . Primary lung adenocarcinoma ([place]) dx'd 08/2015   "right lung"  . Severe major depression with psychotic features ([place]) 04/15/2011  . Tobacco abuse 07/01/2013  . Tobacco abuse   . Type 2 diabetes mellitus with vascular disease ([place]) 07/01/2013   Left lower extremity and right carotid stenting    ALLERGIES:  is allergic to gabapentin; lyrica [pregabalin]; celebrex [celecoxib]; and contrast media [iodinated diagnostic agents].  MEDICATIONS:  Current Outpatient Prescriptions  Medication Sig Dispense Refill  . aspirin EC 81 MG tablet Take 81 mg by mouth daily.     Marland Kitchen atorvastatin (LIPITOR) 40 MG tablet Take 1 tablet (40 mg total) by mouth daily. 90 tablet 3  . clopidogrel (PLAVIX) 75 MG tablet Take 75 mg by mouth at bedtime.     . cyclobenzaprine (FLEXERIL) 10 MG tablet Take 1 tablet (10 mg total) by mouth 3 (three) times daily as needed for muscle spasms. 90 tablet 11  . diphenhydrAMINE (BENADRYL) 25 mg capsule Take 2 capsules (50 mg total) by mouth as directed. Take prior to CT scan as directed 30 capsule 0  . dronabinol (MARINOL) 2.5 MG capsule Take 1 capsule (2.5 mg total) by mouth 2 (two) times daily before a meal. 60 capsule 0  . glipiZIDE (GLUCOTROL) 10 MG tablet Take 2 tablets (20 mg total) by mouth 2 (two) times daily before a meal. 360 tablet 3  . lidocaine-prilocaine (EMLA) cream Apply 1 application topically as needed. 30 g 0  . lisinopril (PRINIVIL,ZESTRIL) 5 MG tablet Take 0.5 tablets (2.5 mg total) by mouth daily. 90 tablet 3  . metFORMIN (GLUCOPHAGE) 500 MG tablet Take  1 tablet (500 mg total) by mouth 2 (two) times daily with a meal. 180 tablet 3  . metoCLOPramide (REGLAN) 10 MG tablet TAKE 1 TABLET BY MOUTH FOUR TIMES DAILY AS NEEDED AS DIRECTED 30 tablet 0  . ondansetron (ZOFRAN) 4 MG tablet Take 1 tablet (4 mg total) by mouth 3 (three) times daily. 20 tablet 0  . oxyCODONE-acetaminophen (PERCOCET) 10-325 MG tablet Take 1-2 tablets by mouth every 6 (six) hours as needed for pain. 240 tablet 0  . pantoprazole (PROTONIX) 40 MG tablet Take 1 tablet (40 mg total) by mouth daily. 90 tablet 3  . predniSONE (DELTASONE) 50 MG tablet Take 1 tablet (50 mg total) by mouth as directed. Take 50 mg 13 hours. 7 hours and 1 hour prior to scan 3 tablet 2  . prochlorperazine (COMPAZINE) 10 MG tablet Take 1 tablet (10 mg total) by mouth every 6 (six) hours as needed for nausea or vomiting. 30 tablet 0  . prochlorperazine (COMPAZINE) 25 MG suppository Place 1 suppository (25 mg total) rectally every 12 (twelve) hours as needed for refractory nausea / vomiting. 12 suppository 11  . senna-docusate (SENOKOT-S) 8.6-50 MG tablet Take 1-2 tablets by mouth 2 (two) times daily as needed for mild constipation or moderate constipation. 30 tablet 1  . sitaGLIPtin (JANUVIA) 100 MG tablet Take 1 tablet (100 mg total) by mouth daily. 90 tablet 3  . sorbitol 70 % solution  Take 15 cc every 4 hours until bowel movement 473 mL 0   No current facility-administered medications for this visit.     SURGICAL HISTORY:  Past Surgical History:  Procedure Laterality Date  . CAROTID STENT Right ?2014  . COLONOSCOPY N/A 11/30/2015   Procedure: COLONOSCOPY;  Surgeon: Teena Irani, MD;  Location[facility] END[facility]COPY;  Service: Endoscopy;  Laterality: N/A;  . ESOPHAGOGASTRODUODENOSCOPY N/A 11/30/2015   Procedure: ESOPHAGOGASTRODUODENOSCOPY (EGD);  Surgeon: Teena Irani, MD;  Location: [facility] ENDOSCOPY;  Service: Endoscopy;  Laterality: N/A;  . ESOPHAGOGASTRODUODENOSCOPY (EGD) WITH PROPOFOL N/A 01/05/2016   Procedure:  ESOPHAGOGASTRODUODENOSCOPY (EGD) WITH PROPOFOL;  Surgeon: Teena Irani, MD;  Location: [facility] ENDOSCOPY;  Service: Endoscopy;  Laterality: N/A;  . FEMORAL ARTERY STENT Left 05/2012; ~ 2015   Archie Endo 06/04/2012; Raechel Chute report  . FRACTURE SURGERY    . GIVENS CAPSULE STUDY N/A 12/22/2015   Procedure: GIVENS CAPSULE STUDY;  Surgeon: Wonda Horner, MD;  Location: [facility] ENDOSCOPY;  Service: Endoscopy;  Laterality: N/A;  . HARDWARE REMOVAL Right 11/15/2011   Removal of deep frontozygomatic orbital hardware/notes 11/15/2011  . HERNIA REPAIR  [id number]   Umbilical  . LAPAROSCOPY N/A 02/27/2016   Procedure: LAPAROSCOPY, LAPAROTOMY  WITH TWO SMALL BOWEL RESECTION;  Surgeon: Johnathan Hausen, MD;  Location: [facility] ORS;  Service: General;  Laterality: N/A;  . ORIF ORBITAL FRACTURE Right 08/15/2010    caught in a hydraulic machine; open reduction internal fixation of orbital rim fracture and open reduction of zygomatic arch fracture  Archie Endo 10/13/2009  . PORTACATH PLACEMENT Left 04/04/2016   Procedure: INSERTION PORT-A-CATH LEFT CHEST;  Surgeon: Melrose Nakayama, MD;  Location: [facility];  Service: Thoracic;  Laterality: Left;  Marland Kitchen VIDEO ASSISTED THORACOSCOPY (VATS)/ LOBECTOMY Right 10/18/2015   Procedure: VIDEO ASSISTED THORACOSCOPY (VATS)/ LOBECTOMY;  Surgeon: Melrose Nakayama, MD;  Location: [facility];  Service: Thoracic;  Laterality: Right;  Marland Kitchen VIDEO BRONCHOSCOPY Bilateral 09/21/2015   Procedure: VIDEO BRONCHOSCOPY WITH FLUORO;  Surgeon: Juanito Doom, MD;  Location: [facility] ENDOSCOPY;  Service: Cardiopulmonary;  Laterality: Bilateral;    REVIEW OF SYSTEMS:  A comprehensive review of systems was negative.   PHYSICAL EXAMINATION: General appearance: alert, cooperative and no distress Head: Normocephalic, without obvious abnormality, atraumatic Neck: no adenopathy, no JVD, supple, symmetrical, trachea midline and thyroid not enlarged, symmetric, no tenderness/mass/nodules Lymph nodes: Cervical, supraclavicular, and axillary nodes  normal. Resp: clear to auscultation bilaterally Back: symmetric, no curvature. ROM normal. No CVA tenderness. Cardio: regular rate and rhythm, S1, S2 normal, no murmur, click, rub or gallop GI: soft, non-tender; bowel sounds normal; no masses,  no organomegaly Extremities: extremities normal, atraumatic, no cyanosis or edema  ECOG PERFORMANCE STATUS: 1 - Symptomatic but completely ambulatory  Blood pressure 120/79, pulse (!) 117, temperature 97.7 F (36.5 C), temperature source Oral, resp. rate 18, height _0  (1.702 m), weight 113 lb 3.2 oz (51.3 kg), SpO2 100 %.  LABORATORY DATA: Lab Results  Component Value Date   WBC 6.8 10/31/2016   HGB 15.0 10/31/2016   HCT 45.0 10/31/2016   MCV 89.3 10/31/2016   PLT 249 10/31/2016      Chemistry      Component Value Date/Time   NA 142 10/31/2016 0928   K 4.6 10/31/2016 0928   CL 103 04/04/2016 1206   CO2 27 10/31/2016 0928   BUN 7.8 10/31/2016 0928   CREATININE 0.7 10/31/2016 0928   GLU 398 (H) 08/08/2016 1257      Component Value Date/Time   CALCIUM 9.6 10/31/2016  0928   ALKPHOS 85 10/31/2016 0928   AST 29 10/31/2016 0928   ALT 25 10/31/2016 0928   BILITOT 0.31 10/31/2016 0928       RADIOGRAPHIC STUDIES: Ct Chest W Contrast  Result Date: 10/10/2016 CLINICAL DATA:  Non-small cell lung cancer, metastatic.  Restaging. EXAM: CT CHEST, ABDOMEN, AND PELVIS WITH CONTRAST TECHNIQUE: Multidetector CT imaging of the chest, abdomen and pelvis was performed following the standard protocol during bolus administration of intravenous contrast. The patient received standard 13 hour contrast allergy prep. CONTRAST:  141m ISOVUE-300 IOPAMIDOL (ISOVUE-300) INJECTION 61% COMPARISON:  Multiple exams, including 08/08/2016 FINDINGS: CT CHEST FINDINGS Cardiovascular: Left Port-A-Cath tip: SVC. Coronary, aortic arch, and branch vessel atherosclerotic vascular disease. Mediastinum/Nodes: Contrast medium in the esophagus compatible with dysmotility or  reflux. Lungs/Pleura: Right upper lobectomy. Minimal tree-in-bud reticulonodular opacity in the left upper lobe on image 39/4, most characteristic of a small area of atypical infectious bronchiolitis or airway plugging. Musculoskeletal: Thoracic spondylosis. CT ABDOMEN PELVIS FINDINGS Hepatobiliary: Mildly contracted gallbladder. Otherwise unremarkable. Pancreas: Unremarkable Spleen: Unremarkable Adrenals/Urinary Tract: Unremarkable Stomach/Bowel: Postoperative findings along right-sided small bowel including a chronic mildly dilated segment along the anastomotic staple line age is likely incidental. Appendix normal. Orally administered contrast extends through the colon to the rectum. Vascular/Lymphatic: Aortoiliac atherosclerotic vascular disease. Severe stenosis and near occlusion of the left external iliac artery on image 100/2, chronic. Left periaortic lymph node referenced on the prior exam measures 0.7 cm in short axis on image 66/2, formerly 1.2 cm. A left external iliac node on image 107/ 2 measures 0.8 cm in short axis, formerly 0.9 cm. A left common iliac node measures 0.5 cm in short axis on image 89/2, previously 0.6 cm. There is subtle mesenteric stranding with some associated small mesenteric lymph nodes, but of reduced conspicuity compared to the prior exam, shown today on images 67 through seventy-three of series 2. Reproductive: Dense focal calcification in the right central prostate gland, stable. Otherwise unremarkable. Other: No supplemental non-categorized findings. Musculoskeletal: Lumbar spondylosis and degenerative disc disease, causing bilateral foraminal impingement at the L4-5 level. IMPRESSION: 1. Continued improvement, with further resolution of the retroperitoneal and mesenteric adenopathy compared to prior exams. Currently there is no residual overtly pathologically enlarged adenopathy. 2. Minimal tree-in-bud reticulonodular opacity in the left upper lobe compatible with atypical  infectious bronchiolitis or mild airway plugging. 3. Aortic Atherosclerosis (ICD10-I70.0). Near occlusion of the left external iliac artery due to atheromatous stenosis. Coronary atherosclerosis. 4. Other imaging findings of potential clinical significance: Right upper lobectomy. Postoperative findings along right-sided small bowel. Lumbar spondylosis and degenerative disc disease causing foraminal impingement at L4-5. Electronically Signed   By: WVan ClinesM.D.   On: 10/10/2016 10:35   Mr Cervical Spine Wo Contrast  Result Date: 10/25/2016 CLINICAL DATA:  [place] back of head. Pain for 2 months. Suspect ligamentous injury. History of stage IV non-small-cell lung cancer. EXAM: MRI CERVICAL SPINE WITHOUT CONTRAST TECHNIQUE: Multiplanar, multisequence MR imaging of the cervical spine was performed. No intravenous contrast was administered. COMPARISON:  CT neck July 19th 2018 FINDINGS: ALIGNMENT: Straightened cervical lordosis.  No malalignment. VERTEBRAE/DISCS: Vertebral bodies are intact. Intervertebral disc morphology's and signal are normal. Mild bright STIR signal RIGHT C3-4 facet. Somewhat bright T1 and T2 bone marrow signal compatible with osteopenia. No suspicious bone marrow signal. CORD:Cervical spinal cord is normal morphology and signal characteristics from the cervicomedullary junction to level of T3-4, the most caudal well visualized level. POSTERIOR FOSSA, VERTEBRAL ARTERIES, PARASPINAL TISSUES: No MR  findings of ligamentous injury. Vertebral artery flow voids present. Included posterior fossa and paraspinal soft tissues are normal. DISC LEVELS: C2-3: No disc bulge, canal stenosis nor neural foraminal narrowing. Mild RIGHT, moderate LEFT facet arthropathy C3-4: Uncovertebral hypertrophy. Severe RIGHT facet arthropathy. Mild LEFT facet arthropathy. No canal stenosis. Moderate to severe RIGHT, moderate LEFT neural foraminal narrowing. C4-5: Uncovertebral hypertrophy. Moderate facet  arthropathy. No canal stenosis. Mild LEFT neural foraminal narrowing. C5-6: Annular bulging, uncovertebral hypertrophy and mild facet arthropathy. No canal stenosis. Mild LEFT neural foraminal narrowing. C6-7: Annular bulging, uncovertebral hypertrophy. No canal stenosis. Mild to moderate LEFT neural foraminal narrowing. C7-T1: No disc bulge, canal stenosis nor neural foraminal narrowing. IMPRESSION: 1. No fracture, malalignment or MR findings of ligamentous injury. 2. Degenerative cervical spine including severe RIGHT C3-4 facet arthropathy and acute inflammatory changes. 3. No canal stenosis. Neural foraminal narrowing C3-4 thru C6-7: Moderate to severe on the RIGHT at C3-4. Electronically Signed   By: Elon Alas M.D.   On: 10/25/2016 02:59   Ct Abdomen Pelvis W Contrast  Result Date: 10/10/2016 CLINICAL DATA:  Non-small cell lung cancer, metastatic.  Restaging. EXAM: CT CHEST, ABDOMEN, AND PELVIS WITH CONTRAST TECHNIQUE: Multidetector CT imaging of the chest, abdomen and pelvis was performed following the standard protocol during bolus administration of intravenous contrast. The patient received standard 13 hour contrast allergy prep. CONTRAST:  150m ISOVUE-300 IOPAMIDOL (ISOVUE-300) INJECTION 61% COMPARISON:  Multiple exams, including 08/08/2016 FINDINGS: CT CHEST FINDINGS Cardiovascular: Left Port-A-Cath tip: SVC. Coronary, aortic arch, and branch vessel atherosclerotic vascular disease. Mediastinum/Nodes: Contrast medium in the esophagus compatible with dysmotility or reflux. Lungs/Pleura: Right upper lobectomy. Minimal tree-in-bud reticulonodular opacity in the left upper lobe on image 39/4, most characteristic of a small area of atypical infectious bronchiolitis or airway plugging. Musculoskeletal: Thoracic spondylosis. CT ABDOMEN PELVIS FINDINGS Hepatobiliary: Mildly contracted gallbladder. Otherwise unremarkable. Pancreas: Unremarkable Spleen: Unremarkable Adrenals/Urinary Tract: Unremarkable  Stomach/Bowel: Postoperative findings along right-sided small bowel including a chronic mildly dilated segment along the anastomotic staple line age is likely incidental. Appendix normal. Orally administered contrast extends through the colon to the rectum. Vascular/Lymphatic: Aortoiliac atherosclerotic vascular disease. Severe stenosis and near occlusion of the left external iliac artery on image 100/2, chronic. Left periaortic lymph node referenced on the prior exam measures 0.7 cm in short axis on image 66/2, formerly 1.2 cm. A left external iliac node on image 107/ 2 measures 0.8 cm in short axis, formerly 0.9 cm. A left common iliac node measures 0.5 cm in short axis on image 89/2, previously 0.6 cm. There is subtle mesenteric stranding with some associated small mesenteric lymph nodes, but of reduced conspicuity compared to the prior exam, shown today on images 67 through seventy-three of series 2. Reproductive: Dense focal calcification in the right central prostate gland, stable. Otherwise unremarkable. Other: No supplemental non-categorized findings. Musculoskeletal: Lumbar spondylosis and degenerative disc disease, causing bilateral foraminal impingement at the L4-5 level. IMPRESSION: 1. Continued improvement, with further resolution of the retroperitoneal and mesenteric adenopathy compared to prior exams. Currently there is no residual overtly pathologically enlarged adenopathy. 2. Minimal tree-in-bud reticulonodular opacity in the left upper lobe compatible with atypical infectious bronchiolitis or mild airway plugging. 3. Aortic Atherosclerosis (ICD10-I70.0). Near occlusion of the left external iliac artery due to atheromatous stenosis. Coronary atherosclerosis. 4. Other imaging findings of potential clinical significance: Right upper lobectomy. Postoperative findings along right-sided small bowel. Lumbar spondylosis and degenerative disc disease causing foraminal impingement at L4-5. Electronically  Signed   By: WVan Clines  M.D.   On: 10/10/2016 10:35     ASSESSMENT AND PLAN:  This is a very pleasant 56 years old white male with metastatic non-small cell lung cancer, adenocarcinoma status post right upper lobectomy with lymph node dissection. Unfortunately the patient was found to have metastatic disease in the jejunum and terminal ileum.  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 The patient was started on treatment with systemic chemotherapy with carboplatin and Alimta for 2 cycles discontinued secondary to intolerance and disease progression. The patient is currently undergoing treatment with second line immunotherapy with Ketruda (pembrolizumab) 200 mg IV every 3 weeks, status post 7 cycles. The patient tolerated the last cycle of his treatment well with no significant adverse effects except for mild arthralgia. I recommended for him to proceed with cycle #8 today as scheduled. I would see him back for follow-up visit in 3 weeks for evaluation before starting cycle #9. The patient was advised to call immediately if he has any concerning symptoms in the interval. The patient voices understanding of current disease status and treatment options and is in agreement with the current care plan. All questions were answered. The patient knows to call the clinic with any problems, questions or concerns. We can certainly see the patient much sooner if necessary.  Disclaimer: This note was dictated with voice recognition software. Similar sounding words can inadvertently be transcribed and may not be corrected upon review.

## 2016-10-31 NOTE — Patient Instructions (Signed)
[facility] Discharge Instructions for Patients Receiving Chemotherapy  Today you received the following chemotherapy agents:  Keytruda.  To help prevent nausea and vomiting after your treatment, we encourage you to take your nausea medication as directed.   If you develop nausea and vomiting that is not controlled by your nausea medication, call the clinic.   BELOW ARE SYMPTOMS THAT SHOULD BE REPORTED IMMEDIATELY:  *FEVER GREATER THAN 100.5 F  *CHILLS WITH OR WITHOUT FEVER  NAUSEA AND VOMITING THAT IS NOT CONTROLLED WITH YOUR NAUSEA MEDICATION  *UNUSUAL SHORTNESS OF BREATH  *UNUSUAL BRUISING OR BLEEDING  TENDERNESS IN MOUTH AND THROAT WITH OR WITHOUT PRESENCE OF ULCERS  *URINARY PROBLEMS  *BOWEL PROBLEMS  UNUSUAL RASH Items with * indicate a potential emergency and should be followed up as soon as possible.  Feel free to call the clinic should you have any questions or concerns. The clinic phone number is [phone].  Please show the CHEMO ALERT CARD at check-in to the Emergency Department and triage nurse.    

## 2016-11-01 NOTE — Progress Notes (Signed)
Patient ID: Brett [name], male   DOB: January 11, 1961, 56 y.o.   MRN: [id number]  UDS appropriate.  Continue with current pain regimen.

## 2016-11-21 ENCOUNTER — Other Ambulatory Visit (HOSPITAL_BASED_OUTPATIENT_CLINIC_OR_DEPARTMENT_OTHER): Payer: Medicare Other

## 2016-11-21 ENCOUNTER — Encounter: Payer: Self-pay | Admitting: Internal Medicine

## 2016-11-21 ENCOUNTER — Ambulatory Visit (HOSPITAL_BASED_OUTPATIENT_CLINIC_OR_DEPARTMENT_OTHER): Payer: Medicare Other | Admitting: Internal Medicine

## 2016-11-21 ENCOUNTER — Ambulatory Visit (HOSPITAL_BASED_OUTPATIENT_CLINIC_OR_DEPARTMENT_OTHER): Payer: Medicare Other

## 2016-11-21 VITALS — BP 113/74 | HR 97 | Temp 98.0°F | Resp 20 | Ht 67.0 in | Wt 117.8 lb

## 2016-11-21 DIAGNOSIS — H538 Other visual disturbances: Secondary | ICD-10-CM | POA: Diagnosis not present

## 2016-11-21 DIAGNOSIS — C3491 Malignant neoplasm of unspecified part of right bronchus or lung: Secondary | ICD-10-CM

## 2016-11-21 DIAGNOSIS — C3411 Malignant neoplasm of upper lobe, right bronchus or lung: Secondary | ICD-10-CM

## 2016-11-21 DIAGNOSIS — Z5112 Encounter for antineoplastic immunotherapy: Secondary | ICD-10-CM

## 2016-11-21 DIAGNOSIS — E119 Type 2 diabetes mellitus without complications: Secondary | ICD-10-CM

## 2016-11-21 DIAGNOSIS — R5382 Chronic fatigue, unspecified: Secondary | ICD-10-CM

## 2016-11-21 DIAGNOSIS — Z79899 Other long term (current) drug therapy: Secondary | ICD-10-CM

## 2016-11-21 DIAGNOSIS — M2550 Pain in unspecified joint: Secondary | ICD-10-CM | POA: Diagnosis not present

## 2016-11-21 DIAGNOSIS — C171 Malignant neoplasm of jejunum: Secondary | ICD-10-CM

## 2016-11-21 DIAGNOSIS — C784 Secondary malignant neoplasm of small intestine: Secondary | ICD-10-CM | POA: Diagnosis not present

## 2016-11-21 DIAGNOSIS — C3490 Malignant neoplasm of unspecified part of unspecified bronchus or lung: Secondary | ICD-10-CM

## 2016-11-21 LAB — CBC WITH DIFFERENTIAL/PLATELET
BASO%: 0.4 % (ref 0.0–2.0)
Basophils Absolute: 0 10*3/uL (ref 0.0–0.1)
EOS%: 6.8 % (ref 0.0–7.0)
Eosinophils Absolute: 0.5 10*3/uL (ref 0.0–0.5)
HCT: 45.4 % (ref 38.4–49.9)
HGB: 14.9 g/dL (ref 13.0–17.1)
LYMPH%: 21.7 % (ref 14.0–49.0)
MCH: 29.6 pg (ref 27.2–33.4)
MCHC: 32.8 g/dL (ref 32.0–36.0)
MCV: 90.3 fL (ref 79.3–98.0)
MONO#: 0.6 10*3/uL (ref 0.1–0.9)
MONO%: 7.5 % (ref 0.0–14.0)
NEUT#: 5.1 10*3/uL (ref 1.5–6.5)
NEUT%: 63.6 % (ref 39.0–75.0)
Platelets: 215 10*3/uL (ref 140–400)
RBC: 5.03 10*6/uL (ref 4.20–5.82)
RDW: 14.7 % — ABNORMAL HIGH (ref 11.0–14.6)
WBC: 8 10*3/uL (ref 4.0–10.3)
lymph#: 1.7 10*3/uL (ref 0.9–3.3)

## 2016-11-21 LAB — COMPREHENSIVE METABOLIC PANEL
ALT: 12 U/L (ref 0–55)
AST: 16 U/L (ref 5–34)
Albumin: 3.9 g/dL (ref 3.5–5.0)
Alkaline Phosphatase: 78 U/L (ref 40–150)
Anion Gap: 10 mEq/L (ref 3–11)
BUN: 6.6 mg/dL — ABNORMAL LOW (ref 7.0–26.0)
CO2: 26 mEq/L (ref 22–29)
Calcium: 9.2 mg/dL (ref 8.4–10.4)
Chloride: 105 mEq/L (ref 98–109)
Creatinine: 0.7 mg/dL (ref 0.7–1.3)
EGFR: >60 mL/min/{1.73_m2} (ref >60)
Glucose: 95 mg/dl (ref 70–140)
Potassium: 3.9 mEq/L (ref 3.5–5.1)
Sodium: 141 mEq/L (ref 136–145)
Total Bilirubin: 0.37 mg/dL (ref 0.20–1.20)
Total Protein: 7.5 g/dL (ref 6.4–8.3)

## 2016-11-21 LAB — TSH: TSH: 2.027 m(IU)/L (ref 0.320–4.118)

## 2016-11-21 MED ORDER — SODIUM CHLORIDE 0.9% FLUSH
10.0000 mL | INTRAVENOUS | Status: DC | PRN
  Administered 2016-11-21: 10 mL
  Filled 2016-11-21: qty 10

## 2016-11-21 MED ORDER — HEPARIN SOD (PORK) LOCK FLUSH 100 UNIT/ML IV SOLN
500.0000 [IU] | Freq: Once | INTRAVENOUS | Status: AC | PRN
  Administered 2016-11-21: 500 [IU]
  Filled 2016-11-21: qty 5

## 2016-11-21 MED ORDER — SODIUM CHLORIDE 0.9 % IV SOLN
200.0000 mg | Freq: Once | INTRAVENOUS | Status: AC
  Administered 2016-11-21: 200 mg via INTRAVENOUS
  Filled 2016-11-21: qty 8

## 2016-11-21 MED ORDER — SODIUM CHLORIDE 0.9 % IV SOLN
Freq: Once | INTRAVENOUS | Status: AC
  Administered 2016-11-21: 12:00:00 via INTRAVENOUS

## 2016-11-21 NOTE — Progress Notes (Signed)
[facility] Telephone:([phone]) [phone]   Fax:(336) [phone]  OFFICE PROGRESS NOTE  [name], MD [address] [place] [address]  DIAGNOSIS: Stage IV (T1b, N0, M1b) non-small cell lung cancer, adenocarcinoma presented with right upper lobe lung nodule and recent metastasis to the small intestine. This was initially diagnosed in September 2017.  Genomic Alterations Identified? ERBB2 amplification - equivocal? CDKN2A p16INK4a E88* and p14ARF B559R SMARCA4 splice site [id number]>OE SPTA1 E2022* TOP2A amplification TP53 A159P Additional Findings? Microsatellite status MS-Stable Tumor Mutation Burden TMB-Intermediate; 18 Muts/Mb Additional Disease-relevant Genes with No Reportable Alterations Identified? EGFR KRAS ALK BRAF MET RET ROS1   PRIOR THERAPY:  1) Status post right VATS with right upper lobectomy and mediastinal lymph node dissection under the care of Dr. Roxan Hockey on 10/18/2015 and the final pathology was consistent with stage IA (T1b, N0, MX). 2) upper endoscopy on 01/05/2016 showed normal esophagus, normal stomach but there was occasional mass around 3.0 CM in length circumferential nonobstructing in the jejunum. The final pathology was consistent with metastatic adenocarcinoma. 3) status post laparoscopic laparotomy and resection of proximal lesion and and distal jejunum/proximal ileum under the care of Dr. Hassell Done 1 02/27/2016. 3)  Systemic chemotherapy with carboplatin for AUC of 5 and Alimta 500 MG/M2 every 3 weeks. First dose 04/04/2016. Status post 2 cycles. Last dose was given 04/21/2016 discontinued secondary to disease progression.   CURRENT THERAPY: Second line immunotherapy with Ketruda 200 mg IV every 2 weeks, first dose 05/30/2016. Status post 8 cycles.  INTERVAL HISTORY: Brett [name] 56 y.o. male returns to the clinic for follow-up visit accompanied by his wife.  The patient continues to tolerate his immunotherapy fairly  well with no significant adverse effect except for recent some visual changes.  That could be also related to his diabetes mellitus.  The patient denied having any nausea, vomiting, diarrhea or constipation.  Has some arthralgia.  He denied having any chest pain, shortness of breath, cough or hemoptysis.  He is here today for evaluation before starting cycle #9 of his treatment.   MEDICAL HISTORY: Past Medical History:  Diagnosis Date  . Anemia 11/28/2015  . Anxiety   . Arthritis    "hands, back" (11/28/2015)  . Barrett's esophagus 07/01/2013   Without dysplasia on biopsy 09/03/2012. Repeat EGD recommended 08/2015  . Carotid artery stenosis 07/01/2013   Requiring right sided stent   . Chronic pain syndrome 07/01/2013  . Closed head injury with brief loss of consciousness ([place]) 07/22/2010   Head trapped in a hydraulic device at work.  Fracture of orbital bones on right and brief loss of consciousness per report.  . 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 There was likely a strong psycho-social overlay in regard to the cognitive deficits in the form of mood disorder with psychotic features and mixed anxiety symptomatology. His primary tested cognitive deficits are in the areas of attention, executi  . Daily headache "since 07/2010"   constantly  . Degenerative joint disease of cervical spine 07/01/2013  . Dehydration 04/11/2016  . Dupuytren's contracture of both hands 04/08/2014  . Encounter for antineoplastic chemotherapy 10/05/2015  . Encounter for antineoplastic immunotherapy 05/24/2016  . Erectile dysfunction associated with type 2 diabetes mellitus ([place]) 07/01/2013  . Fibromyalgia 07/01/2013  . Goals of care, counseling/discussion 03/28/2016  . History of blood transfusion 11/28/2015   "suppose to get  his first today" (11/28/2015)  . Hyperlipidemia LDL goal <  100 07/01/2013  . Intractable hiccups 04/11/2016  . 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 Memory changes    "memory issues" from head injury  . Moderate protein-calorie malnutrition ([place]) 11/29/2015  . Osteoarthritis of right thumb 10/21/2014  . Peripheral vascular occlusive disease ([place]) 07/01/2013   Requiring 2 arterial stents above the left knee per report  . Pneumonia ~ 2006/2007  . Post traumatic stress disorder 07/01/2013  . Primary lung adenocarcinoma ([place]) dx'd 08/2015   "right lung"  . Severe major depression with psychotic features ([place]) 04/15/2011  . Tobacco abuse 07/01/2013  . Tobacco abuse   . Type 2 diabetes mellitus with vascular disease ([place]) 07/01/2013   Left lower extremity and right carotid stenting    ALLERGIES:  is allergic to gabapentin; lyrica [pregabalin]; celebrex [celecoxib]; and contrast media [iodinated diagnostic agents].  MEDICATIONS:  Current Outpatient Prescriptions  Medication Sig Dispense Refill  . aspirin EC 81 MG tablet Take 81 mg by mouth daily.     Marland Kitchen atorvastatin (LIPITOR) 40 MG tablet Take 1 tablet (40 mg total) by mouth daily. 90 tablet 3  . clopidogrel (PLAVIX) 75 MG tablet Take 75 mg by mouth at bedtime.     . cyclobenzaprine (FLEXERIL) 10 MG tablet Take 1 tablet (10 mg total) by mouth 3 (three) times daily as needed for muscle spasms. 90 tablet 11  . diphenhydrAMINE (BENADRYL) 25 mg capsule Take 2 capsules (50 mg total) by mouth as directed. Take prior to CT scan as directed 30 capsule 0  . dronabinol (MARINOL) 2.5 MG capsule Take 1 capsule (2.5 mg total) by mouth 2 (two) times daily before a meal. 60 capsule 0  . glipiZIDE (GLUCOTROL) 10 MG tablet Take 2 tablets (20 mg total) by mouth 2 (two) times daily before a meal. 360 tablet 3  . lidocaine-prilocaine (EMLA) cream Apply 1 application topically as needed. 30 g 0  . lisinopril (PRINIVIL,ZESTRIL) 5 MG tablet Take 0.5 tablets (2.5 mg total) by mouth daily. 90 tablet 3  . metFORMIN (GLUCOPHAGE)  500 MG tablet Take 1 tablet (500 mg total) by mouth 2 (two) times daily with a meal. 180 tablet 3  . metoCLOPramide (REGLAN) 10 MG tablet TAKE 1 TABLET BY MOUTH FOUR TIMES DAILY AS NEEDED AS DIRECTED 30 tablet 0  . ondansetron (ZOFRAN) 4 MG tablet Take 1 tablet (4 mg total) by mouth 3 (three) times daily. 20 tablet 0  . oxyCODONE-acetaminophen (PERCOCET) 10-325 MG tablet Take 1-2 tablets by mouth every 6 (six) hours as needed for pain. 240 tablet 0  . pantoprazole (PROTONIX) 40 MG tablet Take 1 tablet (40 mg total) by mouth daily. 90 tablet 3  . predniSONE (DELTASONE) 50 MG tablet Take 1 tablet (50 mg total) by mouth as directed. Take 50 mg 13 hours. 7 hours and 1 hour prior to scan 3 tablet 2  . prochlorperazine (COMPAZINE) 10 MG tablet Take 1 tablet (10 mg total) by mouth every 6 (six) hours as needed for nausea or vomiting. 30 tablet 0  . prochlorperazine (COMPAZINE) 25 MG suppository Place 1 suppository (25 mg total) rectally every 12 (twelve) hours as needed for refractory nausea / vomiting. 12 suppository 11  . senna-docusate (SENOKOT-S) 8.6-50 MG tablet Take 1-2 tablets by mouth 2 (two) times daily as needed for mild constipation or moderate constipation. 30 tablet 1  . sitaGLIPtin (JANUVIA) 100 MG tablet Take 1 tablet (100 mg total) by mouth daily. 90 tablet 3  .  sorbitol 70 % solution Take 15 cc every 4 hours until bowel movement 473 mL 0   No current facility-administered medications for this visit.     SURGICAL HISTORY:  Past Surgical History:  Procedure Laterality Date  . CAROTID STENT Right ?2014  . COLONOSCOPY N/A 11/30/2015   Procedure: COLONOSCOPY;  Surgeon: Teena Irani, MD;  Location[facility] ENDOSCOPY;  Service: Endoscopy;  Laterality: N/A;  . ESOPHAGOGASTRODUODENOSCOPY N/A 11/30/2015   Procedure: ESOPHAGOGASTRODUODENOSCOPY (EGD);  Surgeon: Teena Irani, MD;  Location: [facility] ENDOSCOPY;  Service: Endoscopy;  Laterality: N/A;  . ESOPHAGOGASTRODUODENOSCOPY (EGD) WITH PROPOFOL N/A 01/05/2016     Procedure: ESOPHAGOGASTRODUODENOSCOPY (EGD) WITH PROPOFOL;  Surgeon: Teena Irani, MD;  Location: [facility] ENDOSCOPY;  Service: Endoscopy;  Laterality: N/A;  . FEMORAL ARTERY STENT Left 05/2012; ~ 2015   Ar[facility] Endo 06/04/2012; Raechel Chute report  . FRACTURE SURGERY    . GIVENS CAPSULE STUDY N/A 12/22/2015   Procedure: GIVENS CAPSULE STUDY;  Surgeon: Wonda Horner, MD;  Location: [facility] ENDOSCOPY;  Service: Endoscopy;  Laterality: N/A;  . HARDWARE REMOVAL Right 11/15/2011   Removal of deep frontozygomatic orbital hardware/notes 11/15/2011  . HERNIA REPAIR  0102   Umbilical  . LAPAROSCOPY N/A 02/27/2016   Procedure: LAPAROSCOPY, LAPAROTOMY  WITH TWO SMALL BOWEL RESECTION;  Surgeon: Johnathan Hausen, MD;  Location: [facility] ORS;  Service: General;  Laterality: N/A;  . ORIF ORBITAL FRACTURE Right 08/15/2010    caught in a hydraulic machine; open reduction internal fixation of orbital rim fracture and open reduction of zygomatic arch fracture  Ar[facility] Endo 10/13/2009  . PORTACATH PLACEMENT Left 04/04/2016   Procedure: INSERTION PORT-A-CATH LEFT CHEST;  Surgeon: Melrose Nakayama, MD;  Location: [facility];  Service: Thoracic;  Laterality: Left;  Marland Kitchen VIDEO ASSISTED THORACOSCOPY (VATS)/ LOBECTOMY Right 10/18/2015   Procedure: VIDEO ASSISTED THORACOSCOPY (VATS)/ LOBECTOMY;  Surgeon: Melrose Nakayama, MD;  Location: [facility];  Service: Thoracic;  Laterality: Right;  Marland Kitchen VIDEO BRONCHOSCOPY Bilateral 09/21/2015   Procedure: VIDEO BRONCHOSCOPY WITH FLUORO;  Surgeon: Juanito Doom, MD;  Location: [facility] ENDOSCOPY;  Service: Cardiopulmonary;  Laterality: Bilateral;    REVIEW OF SYSTEMS:  A comprehensive review of systems was negative except for: Eyes: positive for visual disturbance Musculoskeletal: positive for arthralgias   PHYSICAL EXAMINATION: General appearance: alert, cooperative and no distress Head: Normocephalic, without obvious abnormality, atraumatic Neck: no adenopathy, no JVD, supple, symmetrical, trachea midline and thyroid not  enlarged, symmetric, no tenderness/mass/nodules Lymph nodes: Cervical, supraclavicular, and axillary nodes normal. Resp: clear to auscultation bilaterally Back: symmetric, no curvature. ROM normal. No CVA tenderness. Cardio: regular rate and rhythm, S1, S2 normal, no murmur, click, rub or gallop GI: soft, non-tender; bowel sounds normal; no masses,  no organomegaly Extremities: extremities normal, atraumatic, no cyanosis or edema  ECOG PERFORMANCE STATUS: 1 - Symptomatic but completely ambulatory  Blood pressure 113/74, pulse 97, temperature 98 F (36.7 C), temperature source Oral, resp. rate 20, height '5\' 7"'$  (1.702 m), weight 117 lb 12.8 oz (53.4 kg), SpO2 100 %.  LABORATORY DATA: Lab Results  Component Value Date   WBC 8.0 11/21/2016   HGB 14.9 11/21/2016   HCT 45.4 11/21/2016   MCV 90.3 11/21/2016   PLT 215 11/21/2016      Chemistry      Component Value Date/Time   NA 141 11/21/2016 0957   K 3.9 11/21/2016 0957   CL 103 04/04/2016 1206   CO2 26 11/21/2016 0957   BUN 6.6 (L) 11/21/2016 0957   CREATININE 0.7 11/21/2016 0957   GLU 398 (  H) 08/08/2016 1257      Component Value Date/Time   CALCIUM 9.2 11/21/2016 0957   ALKPHOS 78 11/21/2016 0957   AST 16 11/21/2016 0957   ALT 12 11/21/2016 0957   BILITOT 0.37 11/21/2016 0957       RADIOGRAPHIC STUDIES: Mr Cervical Spine Wo Contrast  Result Date: 10/25/2016 CLINICAL DATA:  [address] and hit back of head. Pain for 2 months. Suspect ligamentous injury. History of stage IV non-small-cell lung cancer. EXAM: MRI CERVICAL SPINE WITHOUT CONTRAST TECHNIQUE: Multiplanar, multisequence MR imaging of the cervical spine was performed. No intravenous contrast was administered. COMPARISON:  CT neck July 19th 2018 FINDINGS: ALIGNMENT: Straightened cervical lordosis.  No malalignment. VERTEBRAE/DISCS: Vertebral bodies are intact. Intervertebral disc morphology's and signal are normal. Mild bright STIR signal RIGHT C3-4 facet. Somewhat bright T1  and T2 bone marrow signal compatible with osteopenia. No suspicious bone marrow signal. CORD:Cervical spinal cord is normal morphology and signal characteristics from the cervicomedullary junction to level of T3-4, the most caudal well visualized level. POSTERIOR FOSSA, VERTEBRAL ARTERIES, PARASPINAL TISSUES: No MR findings of ligamentous injury. Vertebral artery flow voids present. Included posterior fossa and paraspinal soft tissues are normal. DISC LEVELS: C2-3: No disc bulge, canal stenosis nor neural foraminal narrowing. Mild RIGHT, moderate LEFT facet arthropathy C3-4: Uncovertebral hypertrophy. Severe RIGHT facet arthropathy. Mild LEFT facet arthropathy. No canal stenosis. Moderate to severe RIGHT, moderate LEFT neural foraminal narrowing. C4-5: Uncovertebral hypertrophy. Moderate facet arthropathy. No canal stenosis. Mild LEFT neural foraminal narrowing. C5-6: Annular bulging, uncovertebral hypertrophy and mild facet arthropathy. No canal stenosis. Mild LEFT neural foraminal narrowing. C6-7: Annular bulging, uncovertebral hypertrophy. No canal stenosis. Mild to moderate LEFT neural foraminal narrowing. C7-T1: No disc bulge, canal stenosis nor neural foraminal narrowing. IMPRESSION: 1. No fracture, malalignment or MR findings of ligamentous injury. 2. Degenerative cervical spine including severe RIGHT C3-4 facet arthropathy and acute inflammatory changes. 3. No canal stenosis. Neural foraminal narrowing C3-4 thru C6-7: Moderate to severe on the RIGHT at C3-4. Electronically Signed   By: Elon Alas M.D.   On: 10/25/2016 02:59     ASSESSMENT AND PLAN:  This is a very pleasant 56 years old white male with metastatic non-small cell lung cancer, adenocarcinoma status post right upper lobectomy with lymph node dissection. Unfortunately the patient was found to have metastatic disease in the jejunum and terminal ileum.  He underwent surgical resection of the proximal and distal jejunum as well as the  proximal ileum and the final pathology was consistent with high-grade neuroendocrine carcinoma. The patient was started on treatment with systemic chemotherapy with carboplatin and Alimta for 2 cycles discontinued secondary to intolerance and disease progression. The patient is currently undergoing treatment with second line immunotherapy with Ketruda (pembrolizumab) 200 mg IV every 3 weeks, status post 8 cycles. The patient continues to tolerate his current treatment with [facility] fairly well with no significant adverse effects except for mild arthralgia.  I recommended for him to proceed with cycle #9 today. I will see him back for follow-up visit in 3 weeks for evaluation after repeating CT scan of the chest, abdomen and pelvis for restaging of his disease. The patient was advised to call immediately if he has any concerning symptoms in the interval. The patient voices understanding of current disease status and treatment options and is in agreement with the current care plan. All questions were answered. The patient knows to call the clinic with any problems, questions or concerns. We can certainly see the patient much  sooner if necessary.  Disclaimer: This note was dictated with voice recognition software. Similar sounding words can inadvertently be transcribed and may not be corrected upon review.

## 2016-11-21 NOTE — Patient Instructions (Signed)
[facility] Discharge Instructions for Patients Receiving Chemotherapy  Today you received the following chemotherapy agents:  Keytruda.  To help prevent nausea and vomiting after your treatment, we encourage you to take your nausea medication as directed.   If you develop nausea and vomiting that is not controlled by your nausea medication, call the clinic.   BELOW ARE SYMPTOMS THAT SHOULD BE REPORTED IMMEDIATELY:  *FEVER GREATER THAN 100.5 F  *CHILLS WITH OR WITHOUT FEVER  NAUSEA AND VOMITING THAT IS NOT CONTROLLED WITH YOUR NAUSEA MEDICATION  *UNUSUAL SHORTNESS OF BREATH  *UNUSUAL BRUISING OR BLEEDING  TENDERNESS IN MOUTH AND THROAT WITH OR WITHOUT PRESENCE OF ULCERS  *URINARY PROBLEMS  *BOWEL PROBLEMS  UNUSUAL RASH Items with * indicate a potential emergency and should be followed up as soon as possible.  Feel free to call the clinic should you have any questions or concerns. The clinic phone number is [phone].  Please show the CHEMO ALERT CARD at check-in to the Emergency Department and triage nurse.    

## 2016-11-25 ENCOUNTER — Telehealth: Payer: Self-pay | Admitting: Internal Medicine

## 2016-11-25 NOTE — Telephone Encounter (Signed)
Per 11/1 los - patient is already previously scheduled for appointments.

## 2016-12-06 ENCOUNTER — Other Ambulatory Visit: Payer: Self-pay | Admitting: Internal Medicine

## 2016-12-06 DIAGNOSIS — E1159 Type 2 diabetes mellitus with other circulatory complications: Secondary | ICD-10-CM

## 2016-12-09 ENCOUNTER — Ambulatory Visit (HOSPITAL_COMMUNITY)
Admission: RE | Admit: 2016-12-09 | Discharge: 2016-12-09 | Disposition: A | Discharge status: Home / Self Care | Payer: Medicare Other | Source: Ambulatory Visit | Attending: Internal Medicine | Admitting: Internal Medicine

## 2016-12-09 DIAGNOSIS — I700 Atherosclerosis of aorta: Secondary | ICD-10-CM | POA: Diagnosis not present

## 2016-12-09 DIAGNOSIS — C171 Malignant neoplasm of jejunum: Secondary | ICD-10-CM | POA: Diagnosis not present

## 2016-12-09 DIAGNOSIS — C3490 Malignant neoplasm of unspecified part of unspecified bronchus or lung: Secondary | ICD-10-CM

## 2016-12-09 DIAGNOSIS — C3491 Malignant neoplasm of unspecified part of right bronchus or lung: Secondary | ICD-10-CM | POA: Diagnosis not present

## 2016-12-09 DIAGNOSIS — M5136 Other intervertebral disc degeneration, lumbar region: Secondary | ICD-10-CM | POA: Diagnosis not present

## 2016-12-09 DIAGNOSIS — Z5112 Encounter for antineoplastic immunotherapy: Secondary | ICD-10-CM | POA: Diagnosis not present

## 2016-12-09 MED ORDER — IOPAMIDOL (ISOVUE-300) INJECTION 61%
100.0000 mL | Freq: Once | INTRAVENOUS | Status: AC | PRN
  Administered 2016-12-09: 100 mL via INTRAVENOUS

## 2016-12-09 MED ORDER — IOPAMIDOL (ISOVUE-300) INJECTION 61%
INTRAVENOUS | Status: AC
  Filled 2016-12-09: qty 100

## 2016-12-10 ENCOUNTER — Other Ambulatory Visit (HOSPITAL_BASED_OUTPATIENT_CLINIC_OR_DEPARTMENT_OTHER): Payer: Medicare Other

## 2016-12-10 ENCOUNTER — Ambulatory Visit (HOSPITAL_BASED_OUTPATIENT_CLINIC_OR_DEPARTMENT_OTHER): Payer: Medicare Other

## 2016-12-10 ENCOUNTER — Encounter: Payer: Self-pay | Admitting: Internal Medicine

## 2016-12-10 ENCOUNTER — Ambulatory Visit (HOSPITAL_BASED_OUTPATIENT_CLINIC_OR_DEPARTMENT_OTHER): Payer: Medicare Other | Admitting: Internal Medicine

## 2016-12-10 ENCOUNTER — Ambulatory Visit: Payer: Self-pay

## 2016-12-10 VITALS — BP 112/73 | HR 90 | Temp 98.2°F | Resp 17 | Ht 67.0 in | Wt 120.2 lb

## 2016-12-10 DIAGNOSIS — C784 Secondary malignant neoplasm of small intestine: Secondary | ICD-10-CM

## 2016-12-10 DIAGNOSIS — C3491 Malignant neoplasm of unspecified part of right bronchus or lung: Secondary | ICD-10-CM

## 2016-12-10 DIAGNOSIS — Z79899 Other long term (current) drug therapy: Secondary | ICD-10-CM

## 2016-12-10 DIAGNOSIS — C3411 Malignant neoplasm of upper lobe, right bronchus or lung: Secondary | ICD-10-CM

## 2016-12-10 DIAGNOSIS — R5382 Chronic fatigue, unspecified: Secondary | ICD-10-CM

## 2016-12-10 DIAGNOSIS — Z5112 Encounter for antineoplastic immunotherapy: Secondary | ICD-10-CM | POA: Diagnosis not present

## 2016-12-10 DIAGNOSIS — H538 Other visual disturbances: Secondary | ICD-10-CM | POA: Diagnosis not present

## 2016-12-10 DIAGNOSIS — E119 Type 2 diabetes mellitus without complications: Secondary | ICD-10-CM | POA: Diagnosis not present

## 2016-12-10 DIAGNOSIS — Z720 Tobacco use: Secondary | ICD-10-CM

## 2016-12-10 DIAGNOSIS — E1159 Type 2 diabetes mellitus with other circulatory complications: Secondary | ICD-10-CM

## 2016-12-10 LAB — COMPREHENSIVE METABOLIC PANEL
ALT: 12 U/L (ref 0–55)
AST: 8 U/L (ref 5–34)
Albumin: 4.1 g/dL (ref 3.5–5.0)
Alkaline Phosphatase: 79 U/L (ref 40–150)
Anion Gap: 11 mEq/L (ref 3–11)
BUN: 13.9 mg/dL (ref 7.0–26.0)
CO2: 28 mEq/L (ref 22–29)
Calcium: 9.9 mg/dL (ref 8.4–10.4)
Chloride: 103 mEq/L (ref 98–109)
Creatinine: 0.8 mg/dL (ref 0.7–1.3)
EGFR: >60 mL/min/{1.73_m2} (ref >60)
Glucose: 220 mg/dl — ABNORMAL HIGH (ref 70–140)
Potassium: 4.1 mEq/L (ref 3.5–5.1)
Sodium: 142 mEq/L (ref 136–145)
Total Bilirubin: 0.31 mg/dL (ref 0.20–1.20)
Total Protein: 7.6 g/dL (ref 6.4–8.3)

## 2016-12-10 LAB — CBC WITH DIFFERENTIAL/PLATELET
BASO%: 0.4 % (ref 0.0–2.0)
Basophils Absolute: 0 10*3/uL (ref 0.0–0.1)
EOS%: 0.2 % (ref 0.0–7.0)
Eosinophils Absolute: 0 10*3/uL (ref 0.0–0.5)
HCT: 40.8 % (ref 38.4–49.9)
HGB: 13.5 g/dL (ref 13.0–17.1)
LYMPH%: 14.2 % (ref 14.0–49.0)
MCH: 29.7 pg (ref 27.2–33.4)
MCHC: 33.2 g/dL (ref 32.0–36.0)
MCV: 89.7 fL (ref 79.3–98.0)
MONO#: 0.9 10*3/uL (ref 0.1–0.9)
MONO%: 9 % (ref 0.0–14.0)
NEUT#: 7.4 10*3/uL — ABNORMAL HIGH (ref 1.5–6.5)
NEUT%: 76.2 % — ABNORMAL HIGH (ref 39.0–75.0)
Platelets: 274 10*3/uL (ref 140–400)
RBC: 4.55 10*6/uL (ref 4.20–5.82)
RDW: 15.4 % — ABNORMAL HIGH (ref 11.0–14.6)
WBC: 9.8 10*3/uL (ref 4.0–10.3)
lymph#: 1.4 10*3/uL (ref 0.9–3.3)

## 2016-12-10 LAB — TSH: TSH: 3.614 m(IU)/L (ref 0.320–4.118)

## 2016-12-10 MED ORDER — HEPARIN SOD (PORK) LOCK FLUSH 100 UNIT/ML IV SOLN
500.0000 [IU] | Freq: Once | INTRAVENOUS | Status: AC | PRN
  Administered 2016-12-10: 500 [IU]
  Filled 2016-12-10: qty 5

## 2016-12-10 MED ORDER — SODIUM CHLORIDE 0.9 % IV SOLN
200.0000 mg | Freq: Once | INTRAVENOUS | Status: AC
  Administered 2016-12-10: 200 mg via INTRAVENOUS
  Filled 2016-12-10: qty 8

## 2016-12-10 MED ORDER — SODIUM CHLORIDE 0.9% FLUSH
10.0000 mL | INTRAVENOUS | Status: DC | PRN
  Administered 2016-12-10: 10 mL
  Filled 2016-12-10: qty 10

## 2016-12-10 MED ORDER — SODIUM CHLORIDE 0.9 % IV SOLN
Freq: Once | INTRAVENOUS | Status: AC
  Administered 2016-12-10: 10:00:00 via INTRAVENOUS

## 2016-12-10 NOTE — Patient Instructions (Signed)
[facility] Discharge Instructions for Patients Receiving Chemotherapy  Today you received the following chemotherapy agents:  Keytruda.  To help prevent nausea and vomiting after your treatment, we encourage you to take your nausea medication as directed.   If you develop nausea and vomiting that is not controlled by your nausea medication, call the clinic.   BELOW ARE SYMPTOMS THAT SHOULD BE REPORTED IMMEDIATELY:  *FEVER GREATER THAN 100.5 F  *CHILLS WITH OR WITHOUT FEVER  NAUSEA AND VOMITING THAT IS NOT CONTROLLED WITH YOUR NAUSEA MEDICATION  *UNUSUAL SHORTNESS OF BREATH  *UNUSUAL BRUISING OR BLEEDING  TENDERNESS IN MOUTH AND THROAT WITH OR WITHOUT PRESENCE OF ULCERS  *URINARY PROBLEMS  *BOWEL PROBLEMS  UNUSUAL RASH Items with * indicate a potential emergency and should be followed up as soon as possible.  Feel free to call the clinic should you have any questions or concerns. The clinic phone number is [phone].  Please show the CHEMO ALERT CARD at check-in to the Emergency Department and triage nurse.    

## 2016-12-10 NOTE — Progress Notes (Signed)
[place] [phone]   [phone]) [phone]  OFFICE PROGRESS NOTE  Brett Linsey, MD [address] [place] [address]  DIAGNOSIS: Stage IV (T1b, N0, M1b) non-small cell lung cancer, adenocarcinoma presented with right upper lobe lung nodule and recent metastasis to the small intestine. This was initially diagnosed in September 2017.  Genomic Alterations Identified? ERBB2 amplification - equivocal? CDKN2A p16INK4a E88* and p14ARF T267T SMARCA4 splice site [id number] SPTA1 E2022* TOP2A amplification TP53 A159P Additional Findings? Microsatellite status MS-Stable Tumor Mutation Burden TMB-Intermediate; 18 Muts/Mb Additional Disease-relevant Genes with No Reportable Alterations Identified? EGFR KRAS ALK BRAF MET RET ROS1   PRIOR THERAPY:  1) Status post right VATS with right upper lobectomy and mediastinal lymph node dissection under the care of Dr. Roxan Hockey on 10/18/2015 and the final pathology was consistent with stage IA (T1b, N0, MX). 2) upper endoscopy on 01/05/2016 showed normal esophagus, normal stomach but there was occasional mass around 3.0 CM in length circumferential nonobstructing in the jejunum. The final pathology was consistent with metastatic adenocarcinoma. 3) status post laparoscopic laparotomy and resection of proximal lesion and and distal jejunum/proximal ileum under the care of Dr. Hassell Done 1 02/27/2016. 3)  Systemic chemotherapy with carboplatin for AUC of 5 and Alimta 500 MG/M2 every 3 weeks. First dose 04/04/2016. Status post 2 cycles. Last dose was given 04/21/2016 discontinued secondary to disease progression.   CURRENT THERAPY: Second line immunotherapy with Ketruda 200 mg IV every 2 weeks, first dose 05/30/2016. Status post 8 cycles.  INTERVAL HISTORY: Brett [name] 56 y.o. male returns to the clinic today for follow-up visit.  The patient is feeling fine today with no specific complaints.  He continues  to tolerate his treatment with Keytruda fairly well.  He denied having any chest pain, shortness of breath, cough or hemoptysis.  He gained 2 more pounds since his last visit.  He eats very good.  He continues to have blurry vision but he has uncontrolled diabetes mellitus.  The patient has no nausea, vomiting, diarrhea or constipation.  He denied having any fever or chills.  He is here today for evaluation after repeating CT scan of the chest, abdomen and pelvis for restaging of his disease.   MEDICAL HISTORY: Past Medical History:  Diagnosis Date  . Anemia 11/28/2015  . Anxiety   . Arthritis    "hands, back" (11/28/2015)  . Barrett's esophagus 07/01/2013   Without dysplasia on biopsy 09/03/2012. Repeat EGD recommended 08/2015  . Carotid artery stenosis 07/01/2013   Requiring right sided stent   . Chronic pain syndrome 07/01/2013  . Closed head injury with brief loss of consciousness ([place]) 07/22/2010   Head trapped in a hydraulic device at work.  Fracture of orbital bones on right and brief loss of consciousness per report.  . 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 There was likely a strong psycho-social overlay in regard to the cognitive deficits in the form of mood disorder with psychotic features and mixed anxiety symptomatology. His primary tested cognitive deficits are in the areas of attention, executi  . Daily headache "since 07/2010"   constantly  . Degenerative joint disease of cervical spine 07/01/2013  . Dehydration 04/11/2016  . Dupuytren's contracture of both hands 04/08/2014  . Encounter for antineoplastic chemotherapy 10/05/2015  . Encounter for antineoplastic immunotherapy 05/24/2016  . Erectile dysfunction associated with type 2  diabetes mellitus ([place]) 07/01/2013  . Fibromyalgia 07/01/2013  . Goals of care, counseling/discussion 03/28/2016  .  History of blood transfusion 11/28/2015   "suppose to get his first today" (11/28/2015)  . Hyperlipidemia LDL goal < 100 07/01/2013  . Intractable hiccups 04/11/2016  . 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 Memory changes    "memory issues" from head injury  . Moderate protein-calorie malnutrition ([place]) 11/29/2015  . Osteoarthritis of right thumb 10/21/2014  . Peripheral vascular occlusive disease ([place]) 07/01/2013   Requiring 2 arterial stents above the left knee per report  . Pneumonia ~ 2006/2007  . Post traumatic stress disorder 07/01/2013  . Primary lung adenocarcinoma ([place]) dx'd 08/2015   "right lung"  . Severe major depression with psychotic features ([place]) 04/15/2011  . Tobacco abuse 07/01/2013  . Tobacco abuse   . Type 2 diabetes mellitus with vascular disease ([place]) 07/01/2013   Left lower extremity and right carotid stenting    ALLERGIES:  is allergic to gabapentin; lyrica [pregabalin]; celebrex [celecoxib]; and contrast media [iodinated diagnostic agents].  MEDICATIONS:  Current Outpatient Medications  Medication Sig Dispense Refill  . aspirin EC 81 MG tablet Take 81 mg by mouth daily.     Marland Kitchen atorvastatin (LIPITOR) 40 MG tablet Take 1 tablet (40 mg total) by mouth daily. 90 tablet 3  . clopidogrel (PLAVIX) 75 MG tablet Take 75 mg by mouth at bedtime.     . cyclobenzaprine (FLEXERIL) 10 MG tablet Take 1 tablet (10 mg total) by mouth 3 (three) times daily as needed for muscle spasms. 90 tablet 11  . diphenhydrAMINE (BENADRYL) 25 mg capsule Take 2 capsules (50 mg total) by mouth as directed. Take prior to CT scan as directed 30 capsule 0  . dronabinol (MARINOL) 2.5 MG capsule Take 1 capsule (2.5 mg total) by mouth 2 (two) times daily before a meal. 60 capsule 0  . glipiZIDE (GLUCOTROL) 10 MG tablet Take 2 tablets (20 mg total) by mouth 2 (two) times daily before a meal. 360 tablet 3  . lidocaine-prilocaine (EMLA) cream Apply 1 application topically as needed. 30 g 0  . lisinopril  (PRINIVIL,ZESTRIL) 5 MG tablet Take 0.5 tablets (2.5 mg total) by mouth daily. 90 tablet 3  . metFORMIN (GLUCOPHAGE) 500 MG tablet Take 1 tablet (500 mg total) by mouth 2 (two) times daily with a meal. 180 tablet 3  . metoCLOPramide (REGLAN) 10 MG tablet TAKE 1 TABLET BY MOUTH FOUR TIMES DAILY AS NEEDED AS DIRECTED 30 tablet 0  . ondansetron (ZOFRAN) 4 MG tablet Take 1 tablet (4 mg total) by mouth 3 (three) times daily. 20 tablet 0  . oxyCODONE-acetaminophen (PERCOCET) 10-325 MG tablet Take 1-2 tablets by mouth every 6 (six) hours as needed for pain. 240 tablet 0  . pantoprazole (PROTONIX) 40 MG tablet Take 1 tablet (40 mg total) by mouth daily. 90 tablet 3  . predniSONE (DELTASONE) 50 MG tablet Take 1 tablet (50 mg total) by mouth as directed. Take 50 mg 13 hours. 7 hours and 1 hour prior to scan 3 tablet 2  . prochlorperazine (COMPAZINE) 10 MG tablet Take 1 tablet (10 mg total) by mouth every 6 (six) hours as needed for nausea or vomiting. 30 tablet 0  . prochlorperazine (COMPAZINE) 25 MG suppository Place 1 suppository (25 mg total) rectally every 12 (twelve) hours as needed for refractory nausea / vomiting. 12 suppository 11  . senna-docusate (SENOKOT-S) 8.6-50 MG tablet Take 1-2 tablets by mouth 2 (two) times daily as needed for  mild constipation or moderate constipation. 30 tablet 1  . sitaGLIPtin (JANUVIA) 100 MG tablet Take 1 tablet (100 mg total) by mouth daily. 90 tablet 3  . sorbitol 70 % solution Take 15 cc every 4 hours until bowel movement 473 mL 0   No current facility-administered medications for this visit.     SURGICAL HISTORY:  Past Surgical History:  Procedure Laterality Date  . CAROTID STENT Right ?2014  . COLONOSCOPY N/A 11/30/2015   Performed by Teena Irani, MD at [place]  . ESOPHAGOGASTRODUODENOSCOPY (EGD) N/A 11/30/2015   Performed by Teena Irani, MD at [place]  . ESOPHAGOGASTRODUODENOSCOPY (EGD) WITH PROPOFOL N/A 01/05/2016   Performed by Teena Irani, MD at  [place]  . FEMORAL ARTERY STENT Left 05/2012; ~ 2015   [facility] Endo 06/04/2012; Raechel Chute report  . FRACTURE SURGERY    . GIVENS CAPSULE STUDY N/A 12/22/2015   Performed by Wonda Horner, MD at [facility] ENDOSCOPY  . HARDWARE REMOVAL Right 11/15/2011   Removal of deep frontozygomatic orbital hardware/notes 11/15/2011  . HERNIA REPAIR  6283   Umbilical  . INSERTION PORT-A-CATH LEFT CHEST Left 04/04/2016   Performed by Melrose Nakayama, MD at [place]  . LAPAROSCOPY, LAPAROTOMY  WITH TWO SMALL BOWEL RESECTION N/A 02/27/2016   Performed by Johnathan Hausen, MD at [facility] ORS  . ORIF ORBITAL FRACTURE Right 08/15/2010    caught in a hydraulic machine; open reduction internal fixation of orbital rim fracture and open reduction of zygomatic arch fracture  Ar[facility] Endo 10/13/2009  . VIDEO ASSISTED THORACOSCOPY (VATS)/ LOBECTOMY Right 10/18/2015   Performed by Melrose Nakayama, MD at [place]  . VIDEO BRONCHOSCOPY WITH FLUORO Bilateral 09/21/2015   Performed by Juanito Doom, MD at [place]:  Constitutional: negative Eyes: positive for visual disturbance Ears, nose, mouth, throat, and face: negative Respiratory: negative Cardiovascular: negative Gastrointestinal: negative Genitourinary:negative Integument/breast: negative Hematologic/lymphatic: negative Musculoskeletal:negative Neurological: negative Behavioral/Psych: negative Endocrine: negative Allergic/Immunologic: negative   PHYSICAL EXAMINATION: General appearance: alert, cooperative and no distress Head: Normocephalic, without obvious abnormality, atraumatic Neck: no adenopathy, no JVD, supple, symmetrical, trachea midline and thyroid not enlarged, symmetric, no tenderness/mass/nodules Lymph nodes: Cervical, supraclavicular, and axillary nodes normal. Resp: clear to auscultation bilaterally Back: symmetric, no curvature. ROM normal. No CVA tenderness. Cardio: regular rate and rhythm, S1, S2 normal, no murmur, click, rub  or gallop GI: soft, non-tender; bowel sounds normal; no masses,  no organomegaly Extremities: extremities normal, atraumatic, no cyanosis or edema Neurologic: Alert and oriented X 3, normal strength and tone. Normal symmetric reflexes. Normal coordination and gait  ECOG PERFORMANCE STATUS: 1 - Symptomatic but completely ambulatory  Blood pressure 112/73, pulse 90, temperature 98.2 F (36.8 C), temperature source Oral, resp. rate 17, height 5' 7" (1.702 m), weight 120 lb 3.2 oz (54.5 kg), SpO2 100 %.  LABORATORY DATA: Lab Results  Component Value Date   WBC 9.8 12/10/2016   HGB 13.5 12/10/2016   HCT 40.8 12/10/2016   MCV 89.7 12/10/2016   PLT 274 12/10/2016      Chemistry      Component Value Date/Time   NA 141 11/21/2016 0957   K 3.9 11/21/2016 0957   CL 103 04/04/2016 1206   CO2 26 11/21/2016 0957   BUN 6.6 (L) 11/21/2016 0957   CREATININE 0.7 11/21/2016 0957   GLU 398 (H) 08/08/2016 1257      Component Value Date/Time   CALCIUM 9.2 11/21/2016 0957   ALKPHOS 78 11/21/2016  0957   AST 16 11/21/2016 0957   ALT 12 11/21/2016 0957   BILITOT 0.37 11/21/2016 0957       RADIOGRAPHIC STUDIES: Ct Chest W Contrast  Result Date: 12/09/2016 CLINICAL DATA:  Right lung adenocarcinoma. Jejunal adenocarcinoma. Restaging assessment. Prior right upper lobectomy and prior bowel surgery. EXAM: CT CHEST, ABDOMEN, AND PELVIS WITH CONTRAST TECHNIQUE: Multidetector CT imaging of the chest, abdomen and pelvis was performed following the standard protocol during bolus administration of intravenous contrast. CONTRAST:  186m ISOVUE-300 IOPAMIDOL (ISOVUE-300) INJECTION 61% COMPARISON:  Multiple exams, including 10/09/2016 FINDINGS: CT CHEST FINDINGS Cardiovascular: Aortic Atherosclerosis (ICD10-I70.0). Left subclavian Port-A-Cath tip: SVC. Mediastinum/Nodes: Contrast medium in the esophagus suggests dysmotility or reflux. Lungs/Pleura: Prior right upper lobectomy. Expected and stable postoperative  findings. The subtle tree-in-bud nodularity in the left upper lobe is again observed on image 55/6 and is probably due to chronic postinflammatory bronchiolar plugging. No new or worrisome nodule. Musculoskeletal: Thoracic spondylosis. CT ABDOMEN PELVIS FINDINGS Hepatobiliary: Unremarkable Pancreas: Unremarkable Spleen: Unremarkable Adrenals/Urinary Tract: Unremarkable Stomach/Bowel: Prominent stool throughout the colon favors constipation. Postoperative findings in right-sided loops of small bowel. Vascular/Lymphatic: Mild stranding along the central mesentery similar prior. Scattered central mesenteric lymph nodes with largest measuring 10 mm 9 mm in short axis on image 76/2, previously 7 mm. Aortoiliac atherosclerotic vascular disease. A left periaortic node on image 72/2 measures 7 mm in short axis, stable. A right external iliac lymph node with fatty hilum measures 9 mm in short axis on image 113/2, stable. Similar left external iliac lymph node, 0.8 cm in short axis. The left common iliac lymph node on image 95/2 appears stable. Reproductive: Dense calcification in the right central prostate gland. Other: No supplemental non-categorized findings. Musculoskeletal: Lumbar spondylosis and degenerative disc disease with impingement at L4-5 and to a lesser degree at L2-3, L3-4, and L5-S1. IMPRESSION: 1. Essentially stable appearance without findings of recurrent malignancy. There are some upper normal size lymph nodes in the mesentery and pelvis which are not appreciably changed. 2. Other imaging findings of potential clinical significance: Aortic Atherosclerosis (ICD10-I70.0). Suspected esophageal dysmotility or reflux. Right upper lobectomy. Prominent stool throughout the colon favors constipation. Multilevel impingement in the lumbar sine due to spondylosis and degenerative disc disease. Electronically Signed   By: WVan ClinesM.D.   On: 12/09/2016 15:32   Ct Abdomen Pelvis W Contrast  Result Date:  12/09/2016 CLINICAL DATA:  Right lung adenocarcinoma. Jejunal adenocarcinoma. Restaging assessment. Prior right upper lobectomy and prior bowel surgery. EXAM: CT CHEST, ABDOMEN, AND PELVIS WITH CONTRAST TECHNIQUE: Multidetector CT imaging of the chest, abdomen and pelvis was performed following the standard protocol during bolus administration of intravenous contrast. CONTRAST:  1039mISOVUE-300 IOPAMIDOL (ISOVUE-300) INJECTION 61% COMPARISON:  Multiple exams, including 10/09/2016 FINDINGS: CT CHEST FINDINGS Cardiovascular: Aortic Atherosclerosis (ICD10-I70.0). Left subclavian Port-A-Cath tip: SVC. Mediastinum/Nodes: Contrast medium in the esophagus suggests dysmotility or reflux. Lungs/Pleura: Prior right upper lobectomy. Expected and stable postoperative findings. The subtle tree-in-bud nodularity in the left upper lobe is again observed on image 55/6 and is probably due to chronic postinflammatory bronchiolar plugging. No new or worrisome nodule. Musculoskeletal: Thoracic spondylosis. CT ABDOMEN PELVIS FINDINGS Hepatobiliary: Unremarkable Pancreas: Unremarkable Spleen: Unremarkable Adrenals/Urinary Tract: Unremarkable Stomach/Bowel: Prominent stool throughout the colon favors constipation. Postoperative findings in right-sided loops of small bowel. Vascular/Lymphatic: Mild stranding along the central mesentery similar prior. Scattered central mesenteric lymph nodes with largest measuring 10 mm 9 mm in short axis on image 76/2, previously 7 mm. Aortoiliac atherosclerotic vascular disease. A  left periaortic node on image 72/2 measures 7 mm in short axis, stable. A right external iliac lymph node with fatty hilum measures 9 mm in short axis on image 113/2, stable. Similar left external iliac lymph node, 0.8 cm in short axis. The left common iliac lymph node on image 95/2 appears stable. Reproductive: Dense calcification in the right central prostate gland. Other: No supplemental non-categorized findings.  Musculoskeletal: Lumbar spondylosis and degenerative disc disease with impingement at L4-5 and to a lesser degree at L2-3, L3-4, and L5-S1. IMPRESSION: 1. Essentially stable appearance without findings of recurrent malignancy. There are some upper normal size lymph nodes in the mesentery and pelvis which are not appreciably changed. 2. Other imaging findings of potential clinical significance: Aortic Atherosclerosis (ICD10-I70.0). Suspected esophageal dysmotility or reflux. Right upper lobectomy. Prominent stool throughout the colon favors constipation. Multilevel impingement in the lumbar sine due to spondylosis and degenerative disc disease. Electronically Signed   By: Van Clines M.D.   On: 12/09/2016 15:32     ASSESSMENT AND PLAN:  This is a very pleasant 56 years old white male with metastatic non-small cell lung cancer, adenocarcinoma status post right upper lobectomy with lymph node dissection. Unfortunately the patient was found to have metastatic disease in the jejunum and terminal ileum.  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 The patient was started on treatment with systemic chemotherapy with carboplatin and Alimta for 2 cycles discontinued secondary to intolerance and disease progression. The patient is currently undergoing treatment with second line immunotherapy with Ketruda (pembrolizumab) 200 mg IV every 3 weeks, status post 9 cycles. The patient tolerated the last cycle of his treatment well with no significant adverse effects. He had a repeat CT scan of the chest, abdomen and pelvis performed recently.  I personally and independently reviewed the scans and discussed the results with the patient today.  He is a scan showed no evidence for disease progression.  I recommended for the patient to continue his current treatment with [facility] and he  would proceed with cycle #10 today. For the blurry vision, I recommended for the patient to see his ophthalmologist for evaluation and management of this condition. He will come back for follow-up visit in 3 weeks for evaluation before the next cycle of his treatment. The patient was advised to call immediately if he has any concerning symptoms in the interval. The patient voices understanding of current disease status and treatment options and is in agreement with the current care plan. All questions were answered. The patient knows to call the clinic with any problems, questions or concerns. We can certainly see the patient much sooner if necessary.  Disclaimer: This note was dictated with voice recognition software. Similar sounding words can inadvertently be transcribed and may not be corrected upon review.

## 2016-12-11 ENCOUNTER — Ambulatory Visit: Payer: Self-pay

## 2017-01-02 ENCOUNTER — Other Ambulatory Visit (HOSPITAL_BASED_OUTPATIENT_CLINIC_OR_DEPARTMENT_OTHER): Payer: Medicare Other

## 2017-01-02 ENCOUNTER — Ambulatory Visit (HOSPITAL_BASED_OUTPATIENT_CLINIC_OR_DEPARTMENT_OTHER): Payer: Medicare Other

## 2017-01-02 ENCOUNTER — Ambulatory Visit (HOSPITAL_BASED_OUTPATIENT_CLINIC_OR_DEPARTMENT_OTHER): Payer: Medicare Other | Admitting: Internal Medicine

## 2017-01-02 ENCOUNTER — Encounter: Payer: Self-pay | Admitting: Internal Medicine

## 2017-01-02 VITALS — BP 116/83 | HR 100 | Temp 98.9°F | Resp 20 | Wt 120.7 lb

## 2017-01-02 DIAGNOSIS — Z5112 Encounter for antineoplastic immunotherapy: Secondary | ICD-10-CM

## 2017-01-02 DIAGNOSIS — C784 Secondary malignant neoplasm of small intestine: Secondary | ICD-10-CM

## 2017-01-02 DIAGNOSIS — C3411 Malignant neoplasm of upper lobe, right bronchus or lung: Secondary | ICD-10-CM | POA: Diagnosis not present

## 2017-01-02 DIAGNOSIS — C3491 Malignant neoplasm of unspecified part of right bronchus or lung: Secondary | ICD-10-CM

## 2017-01-02 DIAGNOSIS — R5382 Chronic fatigue, unspecified: Secondary | ICD-10-CM

## 2017-01-02 DIAGNOSIS — Z79899 Other long term (current) drug therapy: Secondary | ICD-10-CM | POA: Diagnosis not present

## 2017-01-02 LAB — COMPREHENSIVE METABOLIC PANEL
ALT: 12 U/L (ref 0–55)
AST: 13 U/L (ref 5–34)
Albumin: 4 g/dL (ref 3.5–5.0)
Alkaline Phosphatase: 95 U/L (ref 40–150)
Anion Gap: 9 mEq/L (ref 3–11)
BUN: 9.1 mg/dL (ref 7.0–26.0)
CO2: 26 mEq/L (ref 22–29)
Calcium: 9.3 mg/dL (ref 8.4–10.4)
Chloride: 105 mEq/L (ref 98–109)
Creatinine: 0.7 mg/dL (ref 0.7–1.3)
EGFR: >60 mL/min/{1.73_m2} (ref >60)
Glucose: 116 mg/dl (ref 70–140)
Potassium: 4.3 mEq/L (ref 3.5–5.1)
Sodium: 140 mEq/L (ref 136–145)
Total Bilirubin: 0.42 mg/dL (ref 0.20–1.20)
Total Protein: 7.3 g/dL (ref 6.4–8.3)

## 2017-01-02 LAB — CBC WITH DIFFERENTIAL/PLATELET
BASO%: 0.4 % (ref 0.0–2.0)
Basophils Absolute: 0 10*3/uL (ref 0.0–0.1)
EOS%: 3.3 % (ref 0.0–7.0)
Eosinophils Absolute: 0.2 10*3/uL (ref 0.0–0.5)
HCT: 44.1 % (ref 38.4–49.9)
HGB: 14.4 g/dL (ref 13.0–17.1)
LYMPH%: 19.3 % (ref 14.0–49.0)
MCH: 30.1 pg (ref 27.2–33.4)
MCHC: 32.7 g/dL (ref 32.0–36.0)
MCV: 92.1 fL (ref 79.3–98.0)
MONO#: 0.6 10*3/uL (ref 0.1–0.9)
MONO%: 8.2 % (ref 0.0–14.0)
NEUT#: 5 10*3/uL (ref 1.5–6.5)
NEUT%: 68.8 % (ref 39.0–75.0)
Platelets: 250 10*3/uL (ref 140–400)
RBC: 4.79 10*6/uL (ref 4.20–5.82)
RDW: 14.4 % (ref 11.0–14.6)
WBC: 7.2 10*3/uL (ref 4.0–10.3)
lymph#: 1.4 10*3/uL (ref 0.9–3.3)

## 2017-01-02 LAB — TSH: TSH: 2.293 m(IU)/L (ref 0.320–4.118)

## 2017-01-02 MED ORDER — HEPARIN SOD (PORK) LOCK FLUSH 100 UNIT/ML IV SOLN
500.0000 [IU] | Freq: Once | INTRAVENOUS | Status: AC | PRN
  Administered 2017-01-02: 500 [IU]
  Filled 2017-01-02: qty 5

## 2017-01-02 MED ORDER — SODIUM CHLORIDE 0.9% FLUSH
10.0000 mL | INTRAVENOUS | Status: DC | PRN
  Administered 2017-01-02: 10 mL
  Filled 2017-01-02: qty 10

## 2017-01-02 MED ORDER — SODIUM CHLORIDE 0.9 % IV SOLN
Freq: Once | INTRAVENOUS | Status: AC
  Administered 2017-01-02: 11:00:00 via INTRAVENOUS

## 2017-01-02 MED ORDER — SODIUM CHLORIDE 0.9 % IV SOLN
200.0000 mg | Freq: Once | INTRAVENOUS | Status: AC
  Administered 2017-01-02: 200 mg via INTRAVENOUS
  Filled 2017-01-02: qty 8

## 2017-01-02 NOTE — Progress Notes (Signed)
[name] Telephone:([phone]) [phone]   Fax:([phone]) [phone]  OFFICE PROGRESS NOTE  [name], MD [address] [place] [address]  DIAGNOSIS: Stage IV (T1b, N0, M1b) non-small cell lung cancer, adenocarcinoma presented with right upper lobe lung nodule and recent metastasis to the small intestine. This was initially diagnosed in September 2017.  Genomic Alterations Identified? ERBB2 amplification - equivocal? CDKN2A p16INK4a E88* and p14ARF P329J SMARCA4 splice site [id number]>ZS SPTA1 E2022* TOP2A amplification TP53 A159P Additional Findings? Microsatellite status MS-Stable Tumor Mutation Burden TMB-Intermediate; 18 Muts/Mb Additional Disease-relevant Genes with No Reportable Alterations Identified? EGFR KRAS ALK BRAF MET RET ROS1   PRIOR THERAPY:  1) Status post right VATS with right upper lobectomy and mediastinal lymph node dissection under the care of Dr. Roxan Hockey on 10/18/2015 and the final pathology was consistent with stage IA (T1b, N0, MX). 2) upper endoscopy on 01/05/2016 showed normal esophagus, normal stomach but there was occasional mass around 3.0 CM in length circumferential nonobstructing in the jejunum. The final pathology was consistent with metastatic adenocarcinoma. 3) status post laparoscopic laparotomy and resection of proximal lesion and and distal jejunum/proximal ileum under the care of Dr. Hassell Done 1 02/27/2016. 3)  Systemic chemotherapy with carboplatin for AUC of 5 and Alimta 500 MG/M2 every 3 weeks. First dose 04/04/2016. Status post 2 cycles. Last dose was given 04/21/2016 discontinued secondary to disease progression.   CURRENT THERAPY: Second line immunotherapy with Ketruda 200 mg IV every 2 weeks, first dose 05/30/2016. Status post 10 cycles.  INTERVAL HISTORY: Brett [name] 56 y.o. male returns to the clinic today for follow-up visit accompanied by his wife.  The patient tolerated the last cycle of his treatment  well.  He denied having any current chest pain, shortness of breath, cough or hemoptysis.  He denied having any recent weight loss or night sweats.  He has no nausea, vomiting, diarrhea or constipation.  He has no fever or chills.  The patient is here today for evaluation before starting cycle #11 of his treatment.   MEDICAL HISTORY: Past Medical History:  Diagnosis Date  . Anemia 11/28/2015  . Anxiety   . Arthritis    "hands, back" (11/28/2015)  . Barrett's esophagus 07/01/2013   Without dysplasia on biopsy 09/03/2012. Repeat EGD recommended 08/2015  . Carotid artery stenosis 07/01/2013   Requiring right sided stent   . Chronic pain syndrome 07/01/2013  . Closed head injury with brief loss of consciousness ([place]) 07/22/2010   Head trapped in a hydraulic device at work.  Fracture of orbital bones on right and brief loss of consciousness per report.  . 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 There was likely a strong psycho-social overlay in regard to the cognitive deficits in the form of mood disorder with psychotic features and mixed anxiety symptomatology. His primary tested cognitive deficits are in the areas of attention, executi  . Daily headache "since 07/2010"   constantly  . Degenerative joint disease of cervical spine 07/01/2013  . Dehydration 04/11/2016  . Dupuytren's contracture of both hands 04/08/2014  . Encounter for antineoplastic chemotherapy 10/05/2015  . Encounter for antineoplastic immunotherapy 05/24/2016  . Erectile dysfunction associated with type 2 diabetes mellitus ([place]) 07/01/2013  . Fibromyalgia 07/01/2013  . Goals of care, counseling/discussion 03/28/2016  . History of blood transfusion 11/28/2015   "suppose to get his first today" (11/28/2015)  .  Hyperlipidemia LDL goal < 100 07/01/2013  . Intractable hiccups 04/11/2016  . 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 Memory changes    "memory issues" from head injury  . Moderate protein-calorie malnutrition ([place]) 11/29/2015  . Osteoarthritis of right thumb 10/21/2014  . Peripheral vascular occlusive disease ([place]) 07/01/2013   Requiring 2 arterial stents above the left knee per report  . Pneumonia ~ 2006/2007  . Post traumatic stress disorder 07/01/2013  . Primary lung adenocarcinoma ([place]) dx'd 08/2015   "right lung"  . Severe major depression with psychotic features ([place]) 04/15/2011  . Tobacco abuse 07/01/2013  . Tobacco abuse   . Type 2 diabetes mellitus with vascular disease ([place]) 07/01/2013   Left lower extremity and right carotid stenting    ALLERGIES:  is allergic to gabapentin; lyrica [pregabalin]; celebrex [celecoxib]; and contrast media [iodinated diagnostic agents].  MEDICATIONS:  Current Outpatient Medications  Medication Sig Dispense Refill  . aspirin EC 81 MG tablet Take 81 mg by mouth daily.     . clopidogrel (PLAVIX) 75 MG tablet Take 75 mg by mouth at bedtime.     Marland Kitchen glipiZIDE (GLUCOTROL) 10 MG tablet Take 2 tablets (20 mg total) by mouth 2 (two) times daily before a meal. 360 tablet 3  . lidocaine-prilocaine (EMLA) cream Apply 1 application topically as needed. 30 g 0  . lisinopril (PRINIVIL,ZESTRIL) 5 MG tablet Take 0.5 tablets (2.5 mg total) by mouth daily. 90 tablet 3  . metFORMIN (GLUCOPHAGE) 500 MG tablet Take 1 tablet (500 mg total) by mouth 2 (two) times daily with a meal. 180 tablet 3  . metoCLOPramide (REGLAN) 10 MG tablet TAKE 1 TABLET BY MOUTH FOUR TIMES DAILY AS NEEDED AS DIRECTED 30 tablet 0  . pantoprazole (PROTONIX) 40 MG tablet Take 1 tablet (40 mg total) by mouth daily. 90 tablet 3  . simvastatin (ZOCOR) 10 MG tablet Take 10 mg by mouth.    . sitaGLIPtin (JANUVIA) 100 MG tablet Take 1 tablet (100 mg total) by mouth daily. 90 tablet 3  . VITAMIN B COMPLEX-C CAPS Take by mouth.    Marland Kitchen atorvastatin (LIPITOR) 40 MG tablet Take 1 tablet (40 mg  total) by mouth daily. 90 tablet 3  . cyclobenzaprine (FLEXERIL) 10 MG tablet Take 1 tablet (10 mg total) by mouth 3 (three) times daily as needed for muscle spasms. (Patient not taking: Reported on 01/02/2017) 90 tablet 11  . diphenhydrAMINE (BENADRYL) 25 mg capsule Take 2 capsules (50 mg total) by mouth as directed. Take prior to CT scan as directed (Patient not taking: Reported on 01/02/2017) 30 capsule 0  . dronabinol (MARINOL) 2.5 MG capsule Take 1 capsule (2.5 mg total) by mouth 2 (two) times daily before a meal. (Patient not taking: Reported on 01/02/2017) 60 capsule 0  . L-METHYLFOLATE CALCIUM PO Take by mouth.    . Multiple Vitamin (MULTI-VITAMINS) TABS Take by mouth.    . ondansetron (ZOFRAN) 4 MG tablet Take 1 tablet (4 mg total) by mouth 3 (three) times daily. (Patient not taking: Reported on 01/02/2017) 20 tablet 0  . oxyCODONE-acetaminophen (PERCOCET) 10-325 MG tablet Take 1-2 tablets by mouth every 6 (six) hours as needed for pain. (Patient not taking: Reported on 01/02/2017) 240 tablet 0  . predniSONE (DELTASONE) 50 MG tablet Take 1 tablet (50 mg total) by mouth as directed. Take 50 mg 13 hours. 7 hours and 1 hour prior to scan (Patient not taking: Reported on 01/02/2017) 3 tablet 2  . prochlorperazine (COMPAZINE) 10 MG tablet  Take 1 tablet (10 mg total) by mouth every 6 (six) hours as needed for nausea or vomiting. (Patient not taking: Reported on 01/02/2017) 30 tablet 0  . prochlorperazine (COMPAZINE) 25 MG suppository Place 1 suppository (25 mg total) rectally every 12 (twelve) hours as needed for refractory nausea / vomiting. (Patient not taking: Reported on 01/02/2017) 12 suppository 11  . senna-docusate (SENOKOT-S) 8.6-50 MG tablet Take 1-2 tablets by mouth 2 (two) times daily as needed for mild constipation or moderate constipation. (Patient not taking: Reported on 01/02/2017) 30 tablet 1  . sorbitol 70 % solution Take 15 cc every 4 hours until bowel movement (Patient not taking:  Reported on 01/02/2017) 473 mL 0  . traMADol (ULTRAM) 50 MG tablet Take 50 mg by mouth.     No current facility-administered medications for this visit.     SURGICAL HISTORY:  Past Surgical History:  Procedure Laterality Date  . CAROTID STENT Right ?2014  . COLONOSCOPY N/A 11/30/2015   Procedure: COLONOSCOPY;  Surgeon: Teena Irani, MD;  Location: [facility] ENDOSCOPY;  Service: Endoscopy;  Laterality: N/A;  . ESOPHAGOGASTRODUODENOSCOPY N/A 11/30/2015   Procedure: ESOPHAGOGASTRODUODENOSCOPY (EGD);  Surgeon: Teena Irani, MD;  Location: [facility] ENDOSCOPY;  Service: Endoscopy;  Laterality: N/A;  . ESOPHAGOGASTRODUODENOSCOPY (EGD) WITH PROPOFOL N/A 01/05/2016   Procedure: ESOPHAGOGASTRODUODENOSCOPY (EGD) WITH PROPOFOL;  Surgeon: Teena Irani, MD;  Location: [facility] ENDOSCOPY;  Service: Endoscopy;  Laterality: N/A;  . FEMORAL ARTERY STENT Left 05/2012; ~ 2015   Archie Endo 06/04/2012; Raechel Chute report  . FRACTURE SURGERY    . GIVENS CAPSULE STUDY N/A 12/22/2015   Procedure: GIVENS CAPSULE STUDY;  Surgeon: Wonda Horner, MD;  Location: [facility] ENDOSCOPY;  Service: Endoscopy;  Laterality: N/A;  . HARDWARE REMOVAL Right 11/15/2011   Removal of deep frontozygomatic orbital hardware/notes 11/15/2011  . HERNIA REPAIR  2831   Umbilical  . LAPAROSCOPY N/A 02/27/2016   Procedure: LAPAROSCOPY, LAPAROTOMY  WITH TWO SMALL BOWEL RESECTION;  Surgeon: Johnathan Hausen, MD;  Location: [facility] ORS;  Service: General;  Laterality: N/A;  . ORIF ORBITAL FRACTURE Right 08/15/2010    caught in a hydraulic machine; open reduction internal fixation of orbital rim fracture and open reduction of zygomatic arch fracture  Archie Endo 10/13/2009  . PORTACATH PLACEMENT Left 04/04/2016   Procedure: INSERTION PORT-A-CATH LEFT CHEST;  Surgeon: Melrose Nakayama, MD;  Location: [facility];  Service: Thoracic;  Laterality: Left;  Marland Kitchen VIDEO ASSISTED THORACOSCOPY (VATS)/ LOBECTOMY Right 10/18/2015   Procedure: VIDEO ASSISTED THORACOSCOPY (VATS)/ LOBECTOMY;  Surgeon: Melrose Nakayama, MD;  Location: [facility];  Service: Thoracic;  Laterality: Right;  Marland Kitchen VIDEO BRONCHOSCOPY Bilateral 09/21/2015   Procedure: VIDEO BRONCHOSCOPY WITH FLUORO;  Surgeon: Juanito Doom, MD;  Location: [facility] ENDOSCOPY;  Service: Cardiopulmonary;  Laterality: Bilateral;    REVIEW OF SYSTEMS:  A comprehensive review of systems was negative.   PHYSICAL EXAMINATION: General appearance: alert, cooperative and no distress Head: Normocephalic, without obvious abnormality, atraumatic Neck: no adenopathy, no JVD, supple, symmetrical, trachea midline and thyroid not enlarged, symmetric, no tenderness/mass/nodules Lymph nodes: Cervical, supraclavicular, and axillary nodes normal. Resp: clear to auscultation bilaterally Back: symmetric, no curvature. ROM normal. No CVA tenderness. Cardio: regular rate and rhythm, S1, S2 normal, no murmur, click, rub or gallop GI: soft, non-tender; bowel sounds normal; no masses,  no organomegaly Extremities: extremities normal, atraumatic, no cyanosis or edema  ECOG PERFORMANCE STATUS: 1 - Symptomatic but completely ambulatory  Blood pressure 116/83, pulse 100, temperature 98.9 F (37.2 C), temperature source Oral, resp. rate 20,  weight 120 lb 11.2 oz (54.7 kg), SpO2 100 %.  LABORATORY DATA: Lab Results  Component Value Date   WBC 7.2 01/02/2017   HGB 14.4 01/02/2017   HCT 44.1 01/02/2017   MCV 92.1 01/02/2017   PLT 250 01/02/2017      Chemistry      Component Value Date/Time   NA 140 01/02/2017 0932   K 4.3 01/02/2017 0932   CL 103 04/04/2016 1206   CO2 26 01/02/2017 0932   BUN 9.1 01/02/2017 0932   CREATININE 0.7 01/02/2017 0932   GLU 398 (H) 08/08/2016 1257      Component Value Date/Time   CALCIUM 9.3 01/02/2017 0932   ALKPHOS 95 01/02/2017 0932   AST 13 01/02/2017 0932   ALT 12 01/02/2017 0932   BILITOT 0.42 01/02/2017 0932       RADIOGRAPHIC STUDIES: Ct Chest W Contrast  Result Date: 12/09/2016 CLINICAL DATA:  Right lung  adenocarcinoma. Jejunal adenocarcinoma. Restaging assessment. Prior right upper lobectomy and prior bowel surgery. EXAM: CT CHEST, ABDOMEN, AND PELVIS WITH CONTRAST TECHNIQUE: Multidetector CT imaging of the chest, abdomen and pelvis was performed following the standard protocol during bolus administration of intravenous contrast. CONTRAST:  14m ISOVUE-300 IOPAMIDOL (ISOVUE-300) INJECTION 61% COMPARISON:  Multiple exams, including 10/09/2016 FINDINGS: CT CHEST FINDINGS Cardiovascular: Aortic Atherosclerosis (ICD10-I70.0). Left subclavian Port-A-Cath tip: SVC. Mediastinum/Nodes: Contrast medium in the esophagus suggests dysmotility or reflux. Lungs/Pleura: Prior right upper lobectomy. Expected and stable postoperative findings. The subtle tree-in-bud nodularity in the left upper lobe is again observed on image 55/6 and is probably due to chronic postinflammatory bronchiolar plugging. No new or worrisome nodule. Musculoskeletal: Thoracic spondylosis. CT ABDOMEN PELVIS FINDINGS Hepatobiliary: Unremarkable Pancreas: Unremarkable Spleen: Unremarkable Adrenals/Urinary Tract: Unremarkable Stomach/Bowel: Prominent stool throughout the colon favors constipation. Postoperative findings in right-sided loops of small bowel. Vascular/Lymphatic: Mild stranding along the central mesentery similar prior. Scattered central mesenteric lymph nodes with largest measuring 10 mm 9 mm in short axis on image 76/2, previously 7 mm. Aortoiliac atherosclerotic vascular disease. A left periaortic node on image 72/2 measures 7 mm in short axis, stable. A right external iliac lymph node with fatty hilum measures 9 mm in short axis on image 113/2, stable. Similar left external iliac lymph node, 0.8 cm in short axis. The left common iliac lymph node on image 95/2 appears stable. Reproductive: Dense calcification in the right central prostate gland. Other: No supplemental non-categorized findings. Musculoskeletal: Lumbar spondylosis and  degenerative disc disease with impingement at L4-5 and to a lesser degree at L2-3, L3-4, and L5-S1. IMPRESSION: 1. Essentially stable appearance without findings of recurrent malignancy. There are some upper normal size lymph nodes in the mesentery and pelvis which are not appreciably changed. 2. Other imaging findings of potential clinical significance: Aortic Atherosclerosis (ICD10-I70.0). Suspected esophageal dysmotility or reflux. Right upper lobectomy. Prominent stool throughout the colon favors constipation. Multilevel impingement in the lumbar sine due to spondylosis and degenerative disc disease. Electronically Signed   By: WVan ClinesM.D.   On: 12/09/2016 15:32   Ct Abdomen Pelvis W Contrast  Result Date: 12/09/2016 CLINICAL DATA:  Right lung adenocarcinoma. Jejunal adenocarcinoma. Restaging assessment. Prior right upper lobectomy and prior bowel surgery. EXAM: CT CHEST, ABDOMEN, AND PELVIS WITH CONTRAST TECHNIQUE: Multidetector CT imaging of the chest, abdomen and pelvis was performed following the standard protocol during bolus administration of intravenous contrast. CONTRAST:  109mISOVUE-300 IOPAMIDOL (ISOVUE-300) INJECTION 61% COMPARISON:  Multiple exams, including 10/09/2016 FINDINGS: CT CHEST FINDINGS Cardiovascular: Aortic Atherosclerosis (ICD10-I70.0). Left  subclavian Port-A-Cath tip: SVC. Mediastinum/Nodes: Contrast medium in the esophagus suggests dysmotility or reflux. Lungs/Pleura: Prior right upper lobectomy. Expected and stable postoperative findings. The subtle tree-in-bud nodularity in the left upper lobe is again observed on image 55/6 and is probably due to chronic postinflammatory bronchiolar plugging. No new or worrisome nodule. Musculoskeletal: Thoracic spondylosis. CT ABDOMEN PELVIS FINDINGS Hepatobiliary: Unremarkable Pancreas: Unremarkable Spleen: Unremarkable Adrenals/Urinary Tract: Unremarkable Stomach/Bowel: Prominent stool throughout the colon favors constipation.  Postoperative findings in right-sided loops of small bowel. Vascular/Lymphatic: Mild stranding along the central mesentery similar prior. Scattered central mesenteric lymph nodes with largest measuring 10 mm 9 mm in short axis on image 76/2, previously 7 mm. Aortoiliac atherosclerotic vascular disease. A left periaortic node on image 72/2 measures 7 mm in short axis, stable. A right external iliac lymph node with fatty hilum measures 9 mm in short axis on image 113/2, stable. Similar left external iliac lymph node, 0.8 cm in short axis. The left common iliac lymph node on image 95/2 appears stable. Reproductive: Dense calcification in the right central prostate gland. Other: No supplemental non-categorized findings. Musculoskeletal: Lumbar spondylosis and degenerative disc disease with impingement at L4-5 and to a lesser degree at L2-3, L3-4, and L5-S1. IMPRESSION: 1. Essentially stable appearance without findings of recurrent malignancy. There are some upper normal size lymph nodes in the mesentery and pelvis which are not appreciably changed. 2. Other imaging findings of potential clinical significance: Aortic Atherosclerosis (ICD10-I70.0). Suspected esophageal dysmotility or reflux. Right upper lobectomy. Prominent stool throughout the colon favors constipation. Multilevel impingement in the lumbar sine due to spondylosis and degenerative disc disease. Electronically Signed   By: Van Clines M.D.   On: 12/09/2016 15:32     ASSESSMENT AND PLAN:  This is a very pleasant 56 years old white male with metastatic non-small cell lung cancer, adenocarcinoma status post right upper lobectomy with lymph node dissection. Unfortunately the patient was found to have metastatic disease in the jejunum and terminal ileum.  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 The patient was  started on treatment with systemic chemotherapy with carboplatin and Alimta for 2 cycles discontinued secondary to intolerance and disease progression. The patient is currently undergoing treatment with second line immunotherapy with Ketruda (pembrolizumab) 200 mg IV every 3 weeks, status post 10 cycles. The patient continues to tolerate this treatment well with no significant adverse effects. I recommended for him to proceed with cycle #11 today as a scheduled. I will see him back for follow-up visit in 3 weeks for evaluation before starting cycle #12. The patient voices understanding of current disease status and treatment options and is in agreement with the current care plan. All questions were answered. The patient knows to call the clinic with any problems, questions or concerns. We can certainly see the patient much sooner if necessary.  Disclaimer: This note was dictated with voice recognition software. Similar sounding words can inadvertently be transcribed and may not be corrected upon review.

## 2017-01-02 NOTE — Patient Instructions (Signed)
[facility] Discharge Instructions for Patients Receiving Chemotherapy  Today you received the following chemotherapy agents:  Keytruda.  To help prevent nausea and vomiting after your treatment, we encourage you to take your nausea medication as directed.   If you develop nausea and vomiting that is not controlled by your nausea medication, call the clinic.   BELOW ARE SYMPTOMS THAT SHOULD BE REPORTED IMMEDIATELY:  *FEVER GREATER THAN 100.5 F  *CHILLS WITH OR WITHOUT FEVER  NAUSEA AND VOMITING THAT IS NOT CONTROLLED WITH YOUR NAUSEA MEDICATION  *UNUSUAL SHORTNESS OF BREATH  *UNUSUAL BRUISING OR BLEEDING  TENDERNESS IN MOUTH AND THROAT WITH OR WITHOUT PRESENCE OF ULCERS  *URINARY PROBLEMS  *BOWEL PROBLEMS  UNUSUAL RASH Items with * indicate a potential emergency and should be followed up as soon as possible.  Feel free to call the clinic should you have any questions or concerns. The clinic phone number is [phone].  Please show the CHEMO ALERT CARD at check-in to the Emergency Department and triage nurse.    

## 2017-01-02 NOTE — Patient Instructions (Signed)
Steps to Quit Smoking Smoking tobacco can be bad for your health. It can also affect almost every organ in your body. Smoking puts you and people around you at risk for many serious long-lasting (chronic) diseases. Quitting smoking is hard, but it is one of the best things that you can do for your health. It is never too late to quit. What are the benefits of quitting smoking? When you quit smoking, you lower your risk for getting serious diseases and conditions. They can include:  Lung cancer or lung disease.  Heart disease.  Stroke.  Heart attack.  Not being able to have children (infertility).  Weak bones (osteoporosis) and broken bones (fractures).  If you have coughing, wheezing, and shortness of breath, those symptoms may get better when you quit. You may also get sick less often. If you are pregnant, quitting smoking can help to lower your chances of having a baby of low birth weight. What can I do to help me quit smoking? Talk with your doctor about what can help you quit smoking. Some things you can do (strategies) include:  Quitting smoking totally, instead of slowly cutting back how much you smoke over a period of time.  Going to in-person counseling. You are more likely to quit if you go to many counseling sessions.  Using resources and support systems, such as: ? Online chats with a counselor. ? Phone quitlines. ? Printed self-help materials. ? Support groups or group counseling. ? Text messaging programs. ? Mobile phone apps or applications.  Taking medicines. Some of these medicines may have nicotine in them. If you are pregnant or breastfeeding, do not take any medicines to quit smoking unless your doctor says it is okay. Talk with your doctor about counseling or other things that can help you.  Talk with your doctor about using more than one strategy at the same time, such as taking medicines while you are also going to in-person counseling. This can help make  quitting easier. What things can I do to make it easier to quit? Quitting smoking might feel very hard at first, but there is a lot that you can do to make it easier. Take these steps:  Talk to your family and friends. Ask them to support and encourage you.  Call phone quitlines, reach out to support groups, or work with a counselor.  Ask people who smoke to not smoke around you.  Avoid places that make you want (trigger) to smoke, such as: ? Bars. ? Parties. ? Smoke-break areas at work.  Spend time with people who do not smoke.  Lower the stress in your life. Stress can make you want to smoke. Try these things to help your stress: ? Getting regular exercise. ? Deep-breathing exercises. ? Yoga. ? Meditating. ? Doing a body scan. To do this, close your eyes, focus on one area of your body at a time from head to toe, and notice which parts of your body are tense. Try to relax the muscles in those areas.  Download or buy apps on your mobile phone or tablet that can help you stick to your quit plan. There are many free apps, such as QuitGuide from the CDC (Centers for Disease Control and Prevention). You can find more support from smokefree.gov and other websites.  This information is not intended to replace advice given to you by your health care provider. Make sure you discuss any questions you have with your health care provider. Document Released: 11/03/2008 Document   Revised: 09/05/2015 Document Reviewed: 05/24/2014 Elsevier Interactive Patient Education  2018 Elsevier Inc.  

## 2017-01-04 ENCOUNTER — Telehealth: Payer: Self-pay | Admitting: Internal Medicine

## 2017-01-04 NOTE — Telephone Encounter (Signed)
Added additional appointments for January/February. Schedule mailed

## 2017-01-16 ENCOUNTER — Other Ambulatory Visit: Payer: Self-pay

## 2017-01-16 DIAGNOSIS — C3491 Malignant neoplasm of unspecified part of right bronchus or lung: Secondary | ICD-10-CM

## 2017-01-16 NOTE — Telephone Encounter (Signed)
oxyCODONE-acetaminophen (PERCOCET) 10-325 MG tablet, refill request.

## 2017-01-17 MED ORDER — OXYCODONE-ACETAMINOPHEN 10-325 MG PO TABS: 1.0000 | ORAL_TABLET | Freq: Four times a day (QID) | ORAL | 0 refills | Status: DC | PRN

## 2017-01-17 NOTE — Telephone Encounter (Signed)
Pt's wife calling for Percocet refill.

## 2017-01-17 NOTE — Telephone Encounter (Signed)
Pt's wife informed refill; sent to [facility].

## 2017-01-17 NOTE — Telephone Encounter (Signed)
Would like to know if oxyCODONE-acetaminophen (PERCOCET) 10-325 MG tablet is ready for pick up. Would like it by today.

## 2017-01-23 ENCOUNTER — Inpatient Hospital Stay: Payer: Medicare Other | Attending: Internal Medicine | Admitting: Internal Medicine

## 2017-01-23 ENCOUNTER — Other Ambulatory Visit (HOSPITAL_BASED_OUTPATIENT_CLINIC_OR_DEPARTMENT_OTHER): Payer: Medicare Other

## 2017-01-23 ENCOUNTER — Encounter: Payer: Self-pay | Admitting: Internal Medicine

## 2017-01-23 ENCOUNTER — Ambulatory Visit (HOSPITAL_BASED_OUTPATIENT_CLINIC_OR_DEPARTMENT_OTHER): Payer: Medicare Other

## 2017-01-23 VITALS — BP 103/84 | HR 92 | Temp 98.3°F | Resp 18 | Ht 67.0 in | Wt 121.0 lb

## 2017-01-23 DIAGNOSIS — C3411 Malignant neoplasm of upper lobe, right bronchus or lung: Secondary | ICD-10-CM

## 2017-01-23 DIAGNOSIS — N5082 Scrotal pain: Secondary | ICD-10-CM

## 2017-01-23 DIAGNOSIS — H538 Other visual disturbances: Secondary | ICD-10-CM | POA: Insufficient documentation

## 2017-01-23 DIAGNOSIS — C3490 Malignant neoplasm of unspecified part of unspecified bronchus or lung: Secondary | ICD-10-CM

## 2017-01-23 DIAGNOSIS — C171 Malignant neoplasm of jejunum: Secondary | ICD-10-CM

## 2017-01-23 DIAGNOSIS — C3491 Malignant neoplasm of unspecified part of right bronchus or lung: Secondary | ICD-10-CM

## 2017-01-23 DIAGNOSIS — C784 Secondary malignant neoplasm of small intestine: Secondary | ICD-10-CM

## 2017-01-23 DIAGNOSIS — Z5112 Encounter for antineoplastic immunotherapy: Secondary | ICD-10-CM

## 2017-01-23 DIAGNOSIS — R51 Headache: Secondary | ICD-10-CM | POA: Diagnosis not present

## 2017-01-23 DIAGNOSIS — N5089 Other specified disorders of the male genital organs: Secondary | ICD-10-CM

## 2017-01-23 DIAGNOSIS — Z79899 Other long term (current) drug therapy: Secondary | ICD-10-CM | POA: Diagnosis not present

## 2017-01-23 DIAGNOSIS — R5382 Chronic fatigue, unspecified: Secondary | ICD-10-CM

## 2017-01-23 DIAGNOSIS — G894 Chronic pain syndrome: Secondary | ICD-10-CM

## 2017-01-23 DIAGNOSIS — N509 Disorder of male genital organs, unspecified: Secondary | ICD-10-CM | POA: Insufficient documentation

## 2017-01-23 LAB — CBC WITH DIFFERENTIAL/PLATELET
BASO%: 0.4 % (ref 0.0–2.0)
Basophils Absolute: 0 10*3/uL (ref 0.0–0.1)
EOS%: 3.2 % (ref 0.0–7.0)
Eosinophils Absolute: 0.2 10*3/uL (ref 0.0–0.5)
HCT: 43.8 % (ref 38.4–49.9)
HGB: 14.6 g/dL (ref 13.0–17.1)
LYMPH%: 15.3 % (ref 14.0–49.0)
MCH: 30.5 pg (ref 27.2–33.4)
MCHC: 33.3 g/dL (ref 32.0–36.0)
MCV: 91.6 fL (ref 79.3–98.0)
MONO#: 0.8 10*3/uL (ref 0.1–0.9)
MONO%: 10.5 % (ref 0.0–14.0)
NEUT#: 5.4 10*3/uL (ref 1.5–6.5)
NEUT%: 70.6 % (ref 39.0–75.0)
Platelets: 250 10*3/uL (ref 140–400)
RBC: 4.78 10*6/uL (ref 4.20–5.82)
RDW: 14.1 % (ref 11.0–14.6)
WBC: 7.6 10*3/uL (ref 4.0–10.3)
lymph#: 1.2 10*3/uL (ref 0.9–3.3)

## 2017-01-23 LAB — COMPREHENSIVE METABOLIC PANEL
ALT: 12 U/L (ref 0–55)
AST: 11 U/L (ref 5–34)
Albumin: 4 g/dL (ref 3.5–5.0)
Alkaline Phosphatase: 113 U/L (ref 40–150)
Anion Gap: 8 mEq/L (ref 3–11)
BUN: 8.1 mg/dL (ref 7.0–26.0)
CO2: 28 mEq/L (ref 22–29)
Calcium: 9.6 mg/dL (ref 8.4–10.4)
Chloride: 104 mEq/L (ref 98–109)
Creatinine: 0.8 mg/dL (ref 0.7–1.3)
EGFR: >60 mL/min/{1.73_m2} (ref >60)
Glucose: 162 mg/dl — ABNORMAL HIGH (ref 70–140)
Potassium: 4.5 mEq/L (ref 3.5–5.1)
Sodium: 140 mEq/L (ref 136–145)
Total Bilirubin: 0.4 mg/dL (ref 0.20–1.20)
Total Protein: 7.5 g/dL (ref 6.4–8.3)

## 2017-01-23 LAB — TSH: TSH: 1 m(IU)/L (ref 0.320–4.118)

## 2017-01-23 MED ORDER — SODIUM CHLORIDE 0.9 % IV SOLN
Freq: Once | INTRAVENOUS | Status: AC
  Administered 2017-01-23: 11:00:00 via INTRAVENOUS

## 2017-01-23 MED ORDER — SODIUM CHLORIDE 0.9% FLUSH
10.0000 mL | INTRAVENOUS | Status: DC | PRN
  Administered 2017-01-23: 10 mL
  Filled 2017-01-23: qty 10

## 2017-01-23 MED ORDER — HEPARIN SOD (PORK) LOCK FLUSH 100 UNIT/ML IV SOLN
500.0000 [IU] | Freq: Once | INTRAVENOUS | Status: AC | PRN
  Administered 2017-01-23: 500 [IU]
  Filled 2017-01-23: qty 5

## 2017-01-23 MED ORDER — SODIUM CHLORIDE 0.9 % IV SOLN
200.0000 mg | Freq: Once | INTRAVENOUS | Status: AC
  Administered 2017-01-23: 200 mg via INTRAVENOUS
  Filled 2017-01-23: qty 8

## 2017-01-23 NOTE — Progress Notes (Signed)
[place] [phone]   [phone]  OFFICE PROGRESS NOTE  [name], MD [address] [place] [address]  DIAGNOSIS: Stage IV (T1b, N0, M1b) non-small cell lung cancer, adenocarcinoma presented with right upper lobe lung nodule and recent metastasis to the small intestine. This was initially diagnosed in September 2017.  Genomic Alterations Identified? ERBB2 amplification - equivocal? CDKN2A p16INK4a E88* and p14ARF O671I SMARCA4 splice site [id number]>NK SPTA1 E2022* TOP2A amplification TP53 A159P Additional Findings? Microsatellite status MS-Stable Tumor Mutation Burden TMB-Intermediate; 18 Muts/Mb Additional Disease-relevant Genes with No Reportable Alterations Identified? EGFR KRAS ALK BRAF MET RET ROS1   PRIOR THERAPY:  1) Status post right VATS with right upper lobectomy and mediastinal lymph node dissection under the care of Dr. Roxan Hockey on 10/18/2015 and the final pathology was consistent with stage IA (T1b, N0, MX). 2) upper endoscopy on 01/05/2016 showed normal esophagus, normal stomach but there was occasional mass around 3.0 CM in length circumferential nonobstructing in the jejunum. The final pathology was consistent with metastatic adenocarcinoma. 3) status post laparoscopic laparotomy and resection of proximal lesion and and distal jejunum/proximal ileum under the care of Dr. Hassell Done 1 02/27/2016. 3)  Systemic chemotherapy with carboplatin for AUC of 5 and Alimta 500 MG/M2 every 3 weeks. First dose 04/04/2016. Status post 2 cycles. Last dose was given 04/21/2016 discontinued secondary to disease progression.   CURRENT THERAPY: Second line immunotherapy with Ketruda 200 mg IV every 2 weeks, first dose 05/30/2016. Status post 11 cycles.  INTERVAL HISTORY: Brett [name] 57 y.o. male returns to the clinic today for follow-up visit accompanied by his wife and daughter.  The patient is feeling fine today with  no specific complaints except for blurry vision started 2 weeks ago.  He also has intermittent headache.  He denied having any recent chest pain, shortness breath, cough or hemoptysis.  He has no nausea, vomiting, diarrhea or constipation.  He has no recent weight loss or night sweats.  The patient noticed a mass in the scrotal area between the testicles started 2 weeks ago and it is not improving.  He is here today for evaluation before starting cycle #12.  MEDICAL HISTORY: Past Medical History:  Diagnosis Date  . Anemia 11/28/2015  . Anxiety   . Arthritis    "hands, back" (11/28/2015)  . Barrett's esophagus 07/01/2013   Without dysplasia on biopsy 09/03/2012. Repeat EGD recommended 08/2015  . Carotid artery stenosis 07/01/2013   Requiring right sided stent   . Chronic pain syndrome 07/01/2013  . Closed head injury with brief loss of consciousness ([place]) 07/22/2010   Head trapped in a hydraulic device at work.  Fracture of orbital bones on right and brief loss of consciousness per report.  . 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 There was likely a strong psycho-social overlay in regard to the cognitive deficits in the form of mood disorder with psychotic features and mixed anxiety symptomatology. His primary tested cognitive deficits are in the areas of attention, executi  . Daily headache "since 07/2010"   constantly  . Degenerative joint disease of cervical spine 07/01/2013  . Dehydration 04/11/2016  . Dupuytren's contracture of both hands 04/08/2014  . Encounter for antineoplastic chemotherapy 10/05/2015  . Encounter for antineoplastic immunotherapy 05/24/2016  . Erectile dysfunction associated with type 2 diabetes mellitus ([place]) 07/01/2013  . Fibromyalgia 07/01/2013  .  Goals of care, counseling/discussion 03/28/2016  . History of blood transfusion 11/28/2015     "suppose to get his first today" (11/28/2015)  . Hyperlipidemia LDL goal < 100 07/01/2013  . Intractable hiccups 04/11/2016  . 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 Memory changes    "memory issues" from head injury  . Moderate protein-calorie malnutrition ([place]) 11/29/2015  . Osteoarthritis of right thumb 10/21/2014  . Peripheral vascular occlusive disease ([place]) 07/01/2013   Requiring 2 arterial stents above the left knee per report  . Pneumonia ~ 2006/2007  . Post traumatic stress disorder 07/01/2013  . Primary lung adenocarcinoma ([place]) dx'd 08/2015   "right lung"  . Severe major depression with psychotic features ([place]) 04/15/2011  . Tobacco abuse 07/01/2013  . Tobacco abuse   . Type 2 diabetes mellitus with vascular disease ([place]) 07/01/2013   Left lower extremity and right carotid stenting    ALLERGIES:  is allergic to gabapentin; lyrica [pregabalin]; celebrex [celecoxib]; and contrast media [iodinated diagnostic agents].  MEDICATIONS:  Current Outpatient Medications  Medication Sig Dispense Refill  . aspirin EC 81 MG tablet Take 81 mg by mouth daily.     . clopidogrel (PLAVIX) 75 MG tablet Take 75 mg by mouth at bedtime.     . cyclobenzaprine (FLEXERIL) 10 MG tablet Take 1 tablet (10 mg total) by mouth 3 (three) times daily as needed for muscle spasms. 90 tablet 11  . glipiZIDE (GLUCOTROL) 10 MG tablet Take 2 tablets (20 mg total) by mouth 2 (two) times daily before a meal. 360 tablet 3  . lidocaine-prilocaine (EMLA) cream Apply 1 application topically as needed. 30 g 0  . lisinopril (PRINIVIL,ZESTRIL) 5 MG tablet Take 0.5 tablets (2.5 mg total) by mouth daily. 90 tablet 3  . metFORMIN (GLUCOPHAGE) 500 MG tablet Take 1 tablet (500 mg total) by mouth 2 (two) times daily with a meal. 180 tablet 3  . metoCLOPramide (REGLAN) 10 MG tablet TAKE 1 TABLET BY MOUTH FOUR TIMES DAILY AS NEEDED AS DIRECTED 30 tablet 0  . Multiple Vitamin (MULTI-VITAMINS) TABS Take by mouth.    .  oxyCODONE-acetaminophen (PERCOCET) 10-325 MG tablet Take 1-2 tablets by mouth every 6 (six) hours as needed for pain. 240 tablet 0  . pantoprazole (PROTONIX) 40 MG tablet Take 1 tablet (40 mg total) by mouth daily. 90 tablet 3  . simvastatin (ZOCOR) 10 MG tablet Take 10 mg by mouth.    . sitaGLIPtin (JANUVIA) 100 MG tablet Take 1 tablet (100 mg total) by mouth daily. 90 tablet 3  . atorvastatin (LIPITOR) 40 MG tablet Take 1 tablet (40 mg total) by mouth daily. 90 tablet 3  . diphenhydrAMINE (BENADRYL) 25 mg capsule Take 2 capsules (50 mg total) by mouth as directed. Take prior to CT scan as directed (Patient not taking: Reported on 01/02/2017) 30 capsule 0  . dronabinol (MARINOL) 2.5 MG capsule Take 1 capsule (2.5 mg total) by mouth 2 (two) times daily before a meal. (Patient not taking: Reported on 01/02/2017) 60 capsule 0  . ondansetron (ZOFRAN) 4 MG tablet Take 1 tablet (4 mg total) by mouth 3 (three) times daily. (Patient not taking: Reported on 01/02/2017) 20 tablet 0  . predniSONE (DELTASONE) 50 MG tablet Take 1 tablet (50 mg total) by mouth as directed. Take 50 mg 13 hours. 7 hours and 1 hour prior to scan (Patient not taking: Reported on 01/02/2017) 3 tablet 2  . prochlorperazine (COMPAZINE) 10 MG tablet Take 1 tablet (10 mg total) by mouth every  6 (six) hours as needed for nausea or vomiting. (Patient not taking: Reported on 01/02/2017) 30 tablet 0  . prochlorperazine (COMPAZINE) 25 MG suppository Place 1 suppository (25 mg total) rectally every 12 (twelve) hours as needed for refractory nausea / vomiting. (Patient not taking: Reported on 01/02/2017) 12 suppository 11  . senna-docusate (SENOKOT-S) 8.6-50 MG tablet Take 1-2 tablets by mouth 2 (two) times daily as needed for mild constipation or moderate constipation. (Patient not taking: Reported on 01/02/2017) 30 tablet 1  . sorbitol 70 % solution Take 15 cc every 4 hours until bowel movement (Patient not taking: Reported on 01/02/2017) 473 mL 0    No current facility-administered medications for this visit.     SURGICAL HISTORY:  Past Surgical History:  Procedure Laterality Date  . CAROTID STENT Right ?2014  . COLONOSCOPY N/A 11/30/2015   Procedure: COLONOSCOPY;  Surgeon: Teena Irani, MD;  Location: [facility] ENDOSCOPY;  Service: Endoscopy;  Laterality: N/A;  . ESOPHAGOGASTRODUODENOSCOPY N/A 11/30/2015   Procedure: ESOPHAGOGASTRODUODENOSCOPY (EGD);  Surgeon: Teena Irani, MD;  Location: [facility] ENDOSCOPY;  Service: Endoscopy;  Laterality: N/A;  . ESOPHAGOGASTRODUODENOSCOPY (EGD) WITH PROPOFOL N/A 01/05/2016   Procedure: ESOPHAGOGASTRODUODENOSCOPY (EGD) WITH PROPOFOL;  Surgeon: Teena Irani, MD;  Location: [facility] ENDOSCOPY;  Service: Endoscopy;  Laterality: N/A;  . FEMORAL ARTERY STENT Left 05/2012; ~ 2015   [facility] Endo 06/04/2012; Raechel Chute report  . FRACTURE SURGERY    . GIVENS CAPSULE STUDY N/A 12/22/2015   Procedure: GIVENS CAPSULE STUDY;  Surgeon: Wonda Horner, MD;  Location: [facility] ENDOSCOPY;  Service: Endoscopy;  Laterality: N/A;  . HARDWARE REMOVAL Right 11/15/2011   Removal of deep frontozygomatic orbital hardware/notes 11/15/2011  . HERNIA REPAIR  4196   Umbilical  . LAPAROSCOPY N/A 02/27/2016   Procedure: LAPAROSCOPY, LAPAROTOMY  WITH TWO SMALL BOWEL RESECTION;  Surgeon: Johnathan Hausen, MD;  Location: [facility] ORS;  Service: General;  Laterality: N/A;  . ORIF ORBITAL FRACTURE Right 08/15/2010    caught in a hydraulic machine; open reduction internal fixation of orbital rim fracture and open reduction of zygomatic arch fracture  Ar[facility] Endo 10/13/2009  . PORTACATH PLACEMENT Left 04/04/2016   Procedure: INSERTION PORT-A-CATH LEFT CHEST;  Surgeon: Melrose Nakayama, MD;  Location: [place];  Service: Thoracic;  Laterality: Left;  Marland Kitchen VIDEO ASSISTED THORACOSCOPY (VATS)/ LOBECTOMY Right 10/18/2015   Procedure: VIDEO ASSISTED THORACOSCOPY (VATS)/ LOBECTOMY;  Surgeon: Melrose Nakayama, MD;  Location: [place];  Service: Thoracic;  Laterality: Right;  Marland Kitchen VIDEO BRONCHOSCOPY  Bilateral 09/21/2015   Procedure: VIDEO BRONCHOSCOPY WITH FLUORO;  Surgeon: Juanito Doom, MD;  Location: [facility] ENDOSCOPY;  Service: Cardiopulmonary;  Laterality: Bilateral;    REVIEW OF SYSTEMS:  Constitutional: negative Eyes: negative Ears, nose, mouth, throat, and face: negative Respiratory: negative Cardiovascular: negative Gastrointestinal: negative Genitourinary:negative, Scrotal mass Integument/breast: negative Hematologic/lymphatic: negative Musculoskeletal:negative Neurological: negative Behavioral/Psych: negative Endocrine: negative Allergic/Immunologic: negative   PHYSICAL EXAMINATION: General appearance: alert, cooperative and no distress Head: Normocephalic, without obvious abnormality, atraumatic Neck: no adenopathy, no JVD, supple, symmetrical, trachea midline and thyroid not enlarged, symmetric, no tenderness/mass/nodules Lymph nodes: Cervical, supraclavicular, and axillary nodes normal. Resp: clear to auscultation bilaterally Back: symmetric, no curvature. ROM normal. No CVA tenderness. Cardio: regular rate and rhythm, S1, S2 normal, no murmur, click, rub or gallop GI: soft, non-tender; bowel sounds normal; no masses,  no organomegaly Genitalia: Palpable scrotal mass between the 2 testicles measuring around 2 cm in size.  Nontender to palpation Extremities: extremities normal, atraumatic, no cyanosis or edema Neurologic: Alert and oriented X 3,  normal strength and tone. Normal symmetric reflexes. Normal coordination and gait  ECOG PERFORMANCE STATUS: 1 - Symptomatic but completely ambulatory  Blood pressure 103/84, pulse 92, temperature 98.3 F (36.8 C), temperature source Oral, resp. rate 18, height 5' 7" (1.702 m), weight 121 lb (54.9 kg), SpO2 100 %.  LABORATORY DATA: Lab Results  Component Value Date   WBC 7.6 01/23/2017   HGB 14.6 01/23/2017   HCT 43.8 01/23/2017   MCV 91.6 01/23/2017   PLT 250 01/23/2017      Chemistry      Component Value  Date/Time   NA 140 01/23/2017 0910   K 4.5 01/23/2017 0910   CL 103 04/04/2016 1206   CO2 28 01/23/2017 0910   BUN 8.1 01/23/2017 0910   CREATININE 0.8 01/23/2017 0910   GLU 398 (H) 08/08/2016 1257      Component Value Date/Time   CALCIUM 9.6 01/23/2017 0910   ALKPHOS 113 01/23/2017 0910   AST 11 01/23/2017 0910   ALT 12 01/23/2017 0910   BILITOT 0.40 01/23/2017 0910       RADIOGRAPHIC STUDIES: No results found.   ASSESSMENT AND PLAN:  This is a very pleasant 57 years old white male with metastatic non-small cell lung cancer, adenocarcinoma status post right upper lobectomy with lymph node dissection. Unfortunately the patient was found to have metastatic disease in the jejunum and terminal ileum.  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 The patient was started on treatment with systemic chemotherapy with carboplatin and Alimta for 2 cycles discontinued secondary to intolerance and disease progression. The patient is currently undergoing treatment with second line immunotherapy with Ketruda (pembrolizumab) 200 mg IV every 3 weeks, status post 11 cycles. The patient continues to tolerate this treatment fairly well with no significant adverse effects.  I recommended for him to proceed with cycle #12 today as a schedule. I will see him back for follow-up visit in 3 weeks for evaluation after repeating CT scan of the chest, abdomen and pelvis for restaging of his disease. For the new blurry vision and headache, I would order MRI of the brain to rule out brain metastasis. For the palpable scrotal mass, I would order ultrasound of the scrotum with Doppler to rule out any inflammatory or neoplastic process.  He may need referral to urology based on the ultrasound results. The patient was advised to call immediately if he has any concerning symptoms in the  interval. The patient voices understanding of current disease status and treatment options and is in agreement with the current care plan. All questions were answered. The patient knows to call the clinic with any problems, questions or concerns. We can certainly see the patient much sooner if necessary.  Disclaimer: This note was dictated with voice recognition software. Similar sounding words can inadvertently be transcribed and may not be corrected upon review.

## 2017-01-23 NOTE — Patient Instructions (Signed)
[facility] Discharge Instructions for Patients Receiving Chemotherapy  Today you received the following chemotherapy agents :  Keytruda.  To help prevent nausea and vomiting after your treatment, we encourage you to take your nausea medication as prescribed.   If you develop nausea and vomiting that is not controlled by your nausea medication, call the clinic.   BELOW ARE SYMPTOMS THAT SHOULD BE REPORTED IMMEDIATELY:  *FEVER GREATER THAN 100.5 F  *CHILLS WITH OR WITHOUT FEVER  NAUSEA AND VOMITING THAT IS NOT CONTROLLED WITH YOUR NAUSEA MEDICATION  *UNUSUAL SHORTNESS OF BREATH  *UNUSUAL BRUISING OR BLEEDING  TENDERNESS IN MOUTH AND THROAT WITH OR WITHOUT PRESENCE OF ULCERS  *URINARY PROBLEMS  *BOWEL PROBLEMS  UNUSUAL RASH Items with * indicate a potential emergency and should be followed up as soon as possible.  Feel free to call the clinic should you have any questions or concerns. The clinic phone number is [phone].  Please show the CHEMO ALERT CARD at check-in to the Emergency Department and triage nurse.  

## 2017-01-31 ENCOUNTER — Ambulatory Visit (INDEPENDENT_AMBULATORY_CARE_PROVIDER_SITE_OTHER): Payer: Medicare Other | Admitting: Internal Medicine

## 2017-01-31 ENCOUNTER — Encounter: Payer: Self-pay | Admitting: Internal Medicine

## 2017-01-31 VITALS — BP 112/78 | HR 78 | Temp 97.4°F | Ht 67.0 in | Wt 120.9 lb

## 2017-01-31 DIAGNOSIS — Z87820 Personal history of traumatic brain injury: Secondary | ICD-10-CM

## 2017-01-31 DIAGNOSIS — Z681 Body mass index (BMI) 19 or less, adult: Secondary | ICD-10-CM

## 2017-01-31 DIAGNOSIS — H53149 Visual discomfort, unspecified: Secondary | ICD-10-CM | POA: Diagnosis not present

## 2017-01-31 DIAGNOSIS — H538 Other visual disturbances: Secondary | ICD-10-CM

## 2017-01-31 DIAGNOSIS — K5903 Drug induced constipation: Secondary | ICD-10-CM

## 2017-01-31 DIAGNOSIS — E43 Unspecified severe protein-calorie malnutrition: Secondary | ICD-10-CM | POA: Diagnosis not present

## 2017-01-31 DIAGNOSIS — Z79891 Long term (current) use of opiate analgesic: Secondary | ICD-10-CM

## 2017-01-31 DIAGNOSIS — Z7984 Long term (current) use of oral hypoglycemic drugs: Secondary | ICD-10-CM

## 2017-01-31 DIAGNOSIS — E785 Hyperlipidemia, unspecified: Secondary | ICD-10-CM

## 2017-01-31 DIAGNOSIS — N5089 Other specified disorders of the male genital organs: Secondary | ICD-10-CM

## 2017-01-31 DIAGNOSIS — E1159 Type 2 diabetes mellitus with other circulatory complications: Secondary | ICD-10-CM | POA: Diagnosis not present

## 2017-01-31 DIAGNOSIS — R51 Headache: Secondary | ICD-10-CM

## 2017-01-31 DIAGNOSIS — G894 Chronic pain syndrome: Secondary | ICD-10-CM

## 2017-01-31 DIAGNOSIS — M549 Dorsalgia, unspecified: Secondary | ICD-10-CM | POA: Diagnosis not present

## 2017-01-31 DIAGNOSIS — C3491 Malignant neoplasm of unspecified part of right bronchus or lung: Secondary | ICD-10-CM

## 2017-01-31 DIAGNOSIS — T402X5S Adverse effect of other opioids, sequela: Secondary | ICD-10-CM | POA: Diagnosis not present

## 2017-01-31 DIAGNOSIS — F17210 Nicotine dependence, cigarettes, uncomplicated: Secondary | ICD-10-CM

## 2017-01-31 DIAGNOSIS — E7800 Pure hypercholesterolemia, unspecified: Secondary | ICD-10-CM

## 2017-01-31 DIAGNOSIS — T402X5A Adverse effect of other opioids, initial encounter: Secondary | ICD-10-CM

## 2017-01-31 DIAGNOSIS — Z720 Tobacco use: Secondary | ICD-10-CM

## 2017-01-31 DIAGNOSIS — Z79899 Other long term (current) drug therapy: Secondary | ICD-10-CM

## 2017-01-31 LAB — POCT GLYCOSYLATED HEMOGLOBIN (HGB A1C): Hemoglobin A1C: 8.6

## 2017-01-31 LAB — GLUCOSE, CAPILLARY: Glucose-Capillary: 210 mg/dL — ABNORMAL HIGH (ref 65–99)

## 2017-01-31 MED ORDER — METFORMIN HCL ER 500 MG PO TB24
500.0000 mg | ORAL_TABLET | Freq: Two times a day (BID) | ORAL | 3 refills | Status: DC
Start: 2017-01-31 — End: 2018-06-26

## 2017-01-31 MED ORDER — ATORVASTATIN CALCIUM 40 MG PO TABS: 40.0000 mg | ORAL_TABLET | Freq: Every day | ORAL | 3 refills | Status: DC

## 2017-01-31 NOTE — Patient Instructions (Signed)
It was good to see you again.  1) Keep taking the medications as you are.  2) I sent a referral to the eye doctor to check you eyes out.  3) Do not forget to schedule the CT scan, MRI, and ultrasound.  4) I will refill the percocet electronically now.  I will see you back in 3 months, sooner if necessary.

## 2017-01-31 NOTE — Assessment & Plan Note (Signed)
Assessment  He has restarted smoking and is currently in the pre-contemplative stage not mentally ready to quit at this time. The importance of tobacco cessation on the success of his therapy was discussed.  Plan  He was asked to contact me were he to change his mind and be ready to quit as I would provide him any necessary pharmacologic aid. We will reassess his readiness to quit smoking at the follow-up visit.

## 2017-01-31 NOTE — Assessment & Plan Note (Signed)
Assessment  His chronic pain from his previous traumatic brain injury is reasonably well controlled on the Percocet 10-325 mg 1 tablet every 6 hours as needed for pain. This regimen allows him to function by controlling his chronic pain so that he may enjoy time with his family, make his numerous physician appointments, and run chores outside of the house.  Plan  We will continue the Percocet 10-325 mg 1 tablet every 6 hours as needed for pain dispense #240 per month. We will reassess the efficacy of this regimen in controlling his pain in allowing him to remain functional at the follow-up visit.

## 2017-01-31 NOTE — Assessment & Plan Note (Signed)
Assessment  His glycemic control has deteriorated with a hemoglobin of 8.6 today up from 7.5 three months ago. This is on glipizide 20 mg by mouth twice daily, metformin regular release 500 mg by mouth twice daily, and Januvia 100 mg by mouth daily. He does require periodic prednisone prior to CT scans but this occurs every 9 weeks and therefore is unlikely playing a role. Along with this increase in his hemoglobin he does note blurry vision. This has been present for about 2-1/2-3 months. He admits to eating more carbohydrates with his particular favorites being potatoes and rice. Given his severe protein malnutrition previously and the fact that he is gaining weight we are not necessarily discouraging his diet. With regards to his vascular disease he continues to have stable exertional claudication that has not changed in character.  Plan  We will continue the glipizide at 20 mg by mouth twice daily and Januvia at 100 mg by mouth daily. We decided to change the metformin to extended release 500 mg by mouth twice daily in hopes of him better tolerating this formulation. In the past, we were unable to increase the dose of metformin to 1000 mg dose secondary to GI intolerance. We are hopeful that with the extended release formulation we may have an opportunity to increase the dose if he is unable to decrease his hemoglobin A1c over the next 3 months. A referral to ophthalmology was made for retinal eye examination. For his peripheral vascular disease we are continuing to aggressively manage his hyperlipidemia and have encouraged tobacco cessation. With the above changes in the formulation of the metformin we may also be able to escalate the dose in the future should it be necessary. We will reassess glycemic control with a repeat hemoglobin A1c at follow-up.

## 2017-01-31 NOTE — Assessment & Plan Note (Signed)
He continues to wish to defer the pneumococcal vaccination and flu vaccination during his immunotherapy. He forgot to asked Dr. Earlie Server if it would be okay. An ophthalmologic referral was made. He is otherwise up-to-date on his health care maintenance.

## 2017-01-31 NOTE — Progress Notes (Signed)
   Subjective:    Patient ID: Brett [name], male    DOB: 09/27/1960, 57 y.o.   MRN: [id number]  HPI  Brett [name] is here for follow-up of his type 2 diabetes with vascular disease, metastatic adenocarcinoma of the lung, hyperlipidemia, tobacco abuse, chronic pain syndrome, and chronic constipation. Please see the A&P for the status of the pt's chronic medical problems.  Review of Systems  Constitutional: Negative for activity change, appetite change, fever and unexpected weight change.  Eyes: Positive for photophobia and visual disturbance.       Blurry vision with photophobia.  Respiratory: Negative for chest tightness, shortness of breath and wheezing.   Cardiovascular: Negative for chest pain, palpitations and leg swelling.  Gastrointestinal: Positive for constipation. Negative for abdominal pain, diarrhea, nausea and vomiting.  Genitourinary: Negative for difficulty urinating.  Musculoskeletal: Positive for arthralgias and back pain. Negative for joint swelling and myalgias.  Skin: Negative for color change, rash and wound.  Neurological: Positive for headaches.      Objective:   Physical Exam  Constitutional: He is oriented to person, place, and time. He appears well-developed. No distress.  HENT:  Head: Normocephalic.  Eyes: Conjunctivae are normal. Right eye exhibits no discharge. Left eye exhibits no discharge. No scleral icterus.  Cardiovascular: Normal rate, regular rhythm and normal heart sounds. Exam reveals no gallop and no friction rub.  No murmur heard. Pulmonary/Chest: Effort normal and breath sounds normal. No respiratory distress. He has no wheezes. He has no rales.  Abdominal: Soft. Bowel sounds are normal. He exhibits no distension. There is no tenderness. There is no rebound and no guarding.  Musculoskeletal: Normal range of motion. He exhibits no edema, tenderness or deformity.  Neurological: He is alert and oriented to person, place, and time. He exhibits  normal muscle tone.  Skin: Skin is warm and dry. No rash noted. He is not diaphoretic. No erythema.  Psychiatric: He has a normal mood and affect. His behavior is normal. Judgment and thought content normal.  Nursing note and vitals reviewed.     Assessment & Plan:   Please see problem oriented charting.

## 2017-01-31 NOTE — Assessment & Plan Note (Signed)
Assessment  He is tolerating the atorvastatin 40 mg by mouth daily without myalgias.  Plan  We will continue with this high intensity statin and reassess for intolerances at the follow-up visit.

## 2017-01-31 NOTE — Assessment & Plan Note (Signed)
Assessment  He is tolerating the immunotherapy well and radiographically it appears that his disease is stabilized. When seen by oncology recently he mentioned the new onset of a scrotal mass. An ultrasound was ordered and is currently pending. He is also scheduled to get a repeat CT scan of the chest and abdomen to assess for radiographic stability or progression. Finally, because of blurry vision, an MRI had been ordered. He is been unable to schedule any of these radiographic studies but plans on doing so following week.  Plan  He will continue to follow-up in the cancer center for his immunotherapy. His wife plans on scheduling the ultrasound, MRI, and CT scans next week. I am concerned the photophobia may reflect inflammation from the immunotherapy. He was therefore referred to ophthalmology not only for diabetic retinal examination, but also for an assessment of inflammation of the eyes given the photophobia.

## 2017-01-31 NOTE — Assessment & Plan Note (Signed)
Assessment  His symptoms are well controlled on Senokot and sorbitol as needed for his opioid-induced constipation.  Plan  We will continue the Senokot and sorbitol for the opioid-induced constipation and reassess his symptoms on this regimen at follow-up visit.

## 2017-02-01 ENCOUNTER — Other Ambulatory Visit: Payer: Self-pay | Admitting: Internal Medicine

## 2017-02-01 DIAGNOSIS — E1159 Type 2 diabetes mellitus with other circulatory complications: Secondary | ICD-10-CM

## 2017-02-04 ENCOUNTER — Other Ambulatory Visit: Payer: Self-pay

## 2017-02-10 ENCOUNTER — Ambulatory Visit (HOSPITAL_COMMUNITY)
Admission: RE | Admit: 2017-02-10 | Discharge: 2017-02-10 | Disposition: A | Discharge status: Home / Self Care | Payer: Medicare Other | Source: Ambulatory Visit | Attending: Internal Medicine | Admitting: Internal Medicine

## 2017-02-10 DIAGNOSIS — C3490 Malignant neoplasm of unspecified part of unspecified bronchus or lung: Secondary | ICD-10-CM

## 2017-02-10 DIAGNOSIS — N433 Hydrocele, unspecified: Secondary | ICD-10-CM | POA: Diagnosis not present

## 2017-02-10 DIAGNOSIS — I2510 Atherosclerotic heart disease of native coronary artery without angina pectoris: Secondary | ICD-10-CM | POA: Insufficient documentation

## 2017-02-10 DIAGNOSIS — I700 Atherosclerosis of aorta: Secondary | ICD-10-CM | POA: Insufficient documentation

## 2017-02-10 DIAGNOSIS — C3411 Malignant neoplasm of upper lobe, right bronchus or lung: Secondary | ICD-10-CM | POA: Diagnosis not present

## 2017-02-10 DIAGNOSIS — C784 Secondary malignant neoplasm of small intestine: Secondary | ICD-10-CM | POA: Diagnosis not present

## 2017-02-10 MED ORDER — IOPAMIDOL (ISOVUE-300) INJECTION 61%
INTRAVENOUS | Status: AC
  Filled 2017-02-10: qty 100

## 2017-02-10 MED ORDER — GADOBENATE DIMEGLUMINE 529 MG/ML IV SOLN
15.0000 mL | Freq: Once | INTRAVENOUS | Status: AC | PRN
  Administered 2017-02-10: 12 mL via INTRAVENOUS

## 2017-02-10 MED ORDER — IOPAMIDOL (ISOVUE-300) INJECTION 61%
100.0000 mL | Freq: Once | INTRAVENOUS | Status: AC | PRN
  Administered 2017-02-10: 100 mL via INTRAVENOUS

## 2017-02-12 ENCOUNTER — Encounter: Payer: Self-pay | Admitting: *Deleted

## 2017-02-12 DIAGNOSIS — H25043 Posterior subcapsular polar age-related cataract, bilateral: Secondary | ICD-10-CM | POA: Diagnosis not present

## 2017-02-12 DIAGNOSIS — H5213 Myopia, bilateral: Secondary | ICD-10-CM | POA: Diagnosis not present

## 2017-02-12 DIAGNOSIS — E119 Type 2 diabetes mellitus without complications: Secondary | ICD-10-CM | POA: Diagnosis not present

## 2017-02-12 DIAGNOSIS — H2513 Age-related nuclear cataract, bilateral: Secondary | ICD-10-CM | POA: Diagnosis not present

## 2017-02-12 LAB — HM DIABETES EYE EXAM

## 2017-02-12 MED ORDER — GADOBENATE DIMEGLUMINE 529 MG/ML IV SOLN
15.0000 mL | Freq: Once | INTRAVENOUS | Status: AC | PRN
  Administered 2017-02-10: 12 mL via INTRAVENOUS

## 2017-02-13 ENCOUNTER — Encounter: Payer: Self-pay | Admitting: Internal Medicine

## 2017-02-13 ENCOUNTER — Inpatient Hospital Stay: Payer: Medicare Other

## 2017-02-13 ENCOUNTER — Telehealth: Payer: Self-pay | Admitting: Oncology

## 2017-02-13 ENCOUNTER — Inpatient Hospital Stay (HOSPITAL_BASED_OUTPATIENT_CLINIC_OR_DEPARTMENT_OTHER): Payer: Medicare Other | Admitting: Oncology

## 2017-02-13 VITALS — BP 122/78 | HR 104 | Temp 98.2°F | Resp 18 | Ht 67.0 in | Wt 119.5 lb

## 2017-02-13 DIAGNOSIS — Z79899 Other long term (current) drug therapy: Secondary | ICD-10-CM | POA: Diagnosis not present

## 2017-02-13 DIAGNOSIS — Z5112 Encounter for antineoplastic immunotherapy: Secondary | ICD-10-CM | POA: Diagnosis not present

## 2017-02-13 DIAGNOSIS — C3491 Malignant neoplasm of unspecified part of right bronchus or lung: Secondary | ICD-10-CM

## 2017-02-13 DIAGNOSIS — N5089 Other specified disorders of the male genital organs: Secondary | ICD-10-CM

## 2017-02-13 DIAGNOSIS — H269 Unspecified cataract: Secondary | ICD-10-CM | POA: Insufficient documentation

## 2017-02-13 DIAGNOSIS — N509 Disorder of male genital organs, unspecified: Secondary | ICD-10-CM | POA: Diagnosis not present

## 2017-02-13 DIAGNOSIS — H538 Other visual disturbances: Secondary | ICD-10-CM | POA: Insufficient documentation

## 2017-02-13 DIAGNOSIS — C784 Secondary malignant neoplasm of small intestine: Secondary | ICD-10-CM | POA: Diagnosis not present

## 2017-02-13 DIAGNOSIS — C3411 Malignant neoplasm of upper lobe, right bronchus or lung: Secondary | ICD-10-CM | POA: Diagnosis not present

## 2017-02-13 HISTORY — DX: Unspecified cataract: H26.9

## 2017-02-13 LAB — COMPREHENSIVE METABOLIC PANEL
ALT: 9 U/L (ref 0–55)
AST: 8 U/L (ref 5–34)
Albumin: 3.8 g/dL (ref 3.5–5.0)
Alkaline Phosphatase: 94 U/L (ref 40–150)
Anion gap: 9 (ref 3–11)
BUN: 10 mg/dL (ref 7–26)
CO2: 26 mmol/L (ref 22–29)
Calcium: 9.3 mg/dL (ref 8.4–10.4)
Chloride: 104 mmol/L (ref 98–109)
Creatinine, Ser: 0.83 mg/dL (ref 0.70–1.30)
GFR calc Af Amer: >60 mL/min (ref >60)
GFR calc non Af Amer: >60 mL/min (ref >60)
Glucose, Bld: 291 mg/dL — ABNORMAL HIGH (ref 70–140)
Potassium: 4.4 mmol/L (ref 3.5–5.1)
Sodium: 139 mmol/L (ref 136–145)
Total Bilirubin: 0.3 mg/dL (ref 0.2–1.2)
Total Protein: 7 g/dL (ref 6.4–8.3)

## 2017-02-13 LAB — CBC WITH DIFFERENTIAL/PLATELET
Basophils Absolute: 0.1 10*3/uL (ref 0.0–0.1)
Basophils Relative: 1 %
Eosinophils Absolute: 0.2 10*3/uL (ref 0.0–0.5)
Eosinophils Relative: 3 %
HCT: 41.9 % (ref 38.4–49.9)
Hemoglobin: 14.1 g/dL (ref 13.0–17.1)
Lymphocytes Relative: 20 %
Lymphs Abs: 1.4 10*3/uL (ref 0.9–3.3)
MCH: 30.5 pg (ref 27.2–33.4)
MCHC: 33.7 g/dL (ref 32.0–36.0)
MCV: 90.4 fL (ref 79.3–98.0)
Monocytes Absolute: 0.6 10*3/uL (ref 0.1–0.9)
Monocytes Relative: 8 %
Neutro Abs: 4.8 10*3/uL (ref 1.5–6.5)
Neutrophils Relative %: 68 %
Platelets: 223 10*3/uL (ref 140–400)
RBC: 4.64 MIL/uL (ref 4.20–5.82)
RDW: 14.5 % (ref 11.0–15.6)
WBC: 7.1 10*3/uL (ref 4.0–10.3)

## 2017-02-13 MED ORDER — SODIUM CHLORIDE 0.9 % IV SOLN
Freq: Once | INTRAVENOUS | Status: AC
  Administered 2017-02-13: 12:00:00 via INTRAVENOUS

## 2017-02-13 MED ORDER — SODIUM CHLORIDE 0.9% FLUSH
10.0000 mL | INTRAVENOUS | Status: DC | PRN
  Administered 2017-02-13: 10 mL
  Filled 2017-02-13: qty 10

## 2017-02-13 MED ORDER — SODIUM CHLORIDE 0.9 % IV SOLN
200.0000 mg | Freq: Once | INTRAVENOUS | Status: AC
  Administered 2017-02-13: 200 mg via INTRAVENOUS
  Filled 2017-02-13: qty 8

## 2017-02-13 MED ORDER — HEPARIN SOD (PORK) LOCK FLUSH 100 UNIT/ML IV SOLN
500.0000 [IU] | Freq: Once | INTRAVENOUS | Status: AC | PRN
  Administered 2017-02-13: 500 [IU]
  Filled 2017-02-13: qty 5

## 2017-02-13 NOTE — Assessment & Plan Note (Signed)
This is a very pleasant 57 year old white male with metastatic non-small cell lung cancer, adenocarcinoma status post right upper lobectomy with lymph node dissection. Unfortunately the patient was found to have metastatic disease in the jejunum and terminal ileum.  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 The patient was started on treatment with systemic chemotherapy with carboplatin and Alimta for 2 cycles discontinued secondary to intolerance and disease progression. The patient is currently undergoing treatment with second line immunotherapy with Keytruda (pembrolizumab) 200 mg IV every 3 weeks, status post 12 cycles. The patient continues to tolerate this treatment fairly well with no significant adverse effects.    The patient was seen with Dr. Julien Nordmann.  Restaging CT scan shows no evidence of disease progression.  MRI of the brain is negative for brain metastases.  Recommend that he continue on his current treatment with Keytruda.  He will proceed with cycle 13 today as scheduled.  Blurred vision is likely due to his cataracts.  Patient plans to proceed with cataract surgery in the near future.  Ultrasound of the scrotum was reviewed with the patient wishes not show any abnormality.  The mass that he feels may be related to blood vessels.  Recommend that he continue to monitor this.  Follow-up visit will be in 3 weeks for evaluation prior to cycle 14 of treatment.  The patient was advised to call immediately if he has any concerning symptoms in the interval. The patient voices understanding of current disease status and treatment options and is in agreement with the current care plan. All questions were answered. The patient knows to call the clinic with any problems, questions or concerns. We can certainly see the patient much sooner if necessary.

## 2017-02-13 NOTE — Telephone Encounter (Signed)
Scheduled appt per 1/24 los - Patient to get an updated schedule next visit.

## 2017-02-13 NOTE — Patient Instructions (Addendum)
[facility] Discharge Instructions for Patients Receiving Chemotherapy  Today you received the following chemotherapy agents pembrolizumab [name]).  To help prevent nausea and vomiting after your treatment, we encourage you to take your nausea medication as prescribed.  If you develop nausea and vomiting that is not controlled by your nausea medication, call the clinic.   BELOW ARE SYMPTOMS THAT SHOULD BE REPORTED IMMEDIATELY:  *FEVER GREATER THAN 100.5 F  *CHILLS WITH OR WITHOUT FEVER  NAUSEA AND VOMITING THAT IS NOT CONTROLLED WITH YOUR NAUSEA MEDICATION  *UNUSUAL SHORTNESS OF BREATH  *UNUSUAL BRUISING OR BLEEDING  TENDERNESS IN MOUTH AND THROAT WITH OR WITHOUT PRESENCE OF ULCERS  *URINARY PROBLEMS  *BOWEL PROBLEMS  UNUSUAL RASH Items with * indicate a potential emergency and should be followed up as soon as possible.  Feel free to call the clinic should you have any questions or concerns. The clinic phone number is [phone].  Please show the [facility] at check-in to the Emergency Department and triage nurse.

## 2017-02-13 NOTE — Progress Notes (Signed)
[name] OFFICE PROGRESS NOTE  [name], MD [facility] [place] [address]  DIAGNOSIS: Stage IV (T1b, N0, M1b) non-small cell lung cancer, adenocarcinoma presented with right upper lobe lung nodule and recent metastasis to the small intestine. This was initially diagnosed in September 2017.  Genomic Alterations Identified? ERBB2 amplification - equivocal? CDKN2A p16INK4a E88* and p14ARF U023X SMARCA4 splice site [id number]>GB SPTA1 E2022* TOP2A amplification TP53 A159P Additional Findings? Microsatellite status MS-Stable Tumor Mutation Burden TMB-Intermediate; 18 Muts/Mb Additional Disease-relevant Genes with No Reportable Alterations Identified? EGFR KRAS ALK BRAF MET RET ROS1   PRIOR THERAPY: 1) Status post right VATS with right upper lobectomy and mediastinal lymph node dissection under the care of Dr. Roxan Hockey on 10/18/2015 and the final pathology was consistent with stage IA (T1b, N0, MX). 2) upper endoscopy on 01/05/2016 showed normal esophagus, normal stomach but there was occasional mass around 3.0 CM in length circumferential nonobstructing in the jejunum. The final pathology was consistent with metastatic adenocarcinoma. 3) status post laparoscopic laparotomy and resection of proximal lesion and and distal jejunum/proximal ileum under the care of Dr. Hassell Done 1 02/27/2016. 3)  Systemic chemotherapy with carboplatin for AUC of 5 and Alimta 500 MG/M2 every 3 weeks. First dose 04/04/2016. Status post 2 cycles. Last dose was given 04/21/2016 discontinued secondary to disease progression.  CURRENT THERAPY: Second line immunotherapy with Ketruda 200 mg IV every 2 weeks, first dose 05/30/2016. Status post 12 cycles.  INTERVAL HISTORY: Brett [name] 57 y.o. male returns for routine follow-up visit accompanied by his wife.  The patient is feeling fine today with no specific complaints except for blurry vision which is been present for about 4-5  weeks and ongoing scrotal mass which is unchanged for the past month.  Patient reports that he was seen by an eye doctor recently and was diagnosed as having cataracts which may explain his blurred vision.  He anticipates having surgery for his cataracts in the near future.  The patient denies fevers and chills.  Denies chest pain, shortness breath, cough, hemoptysis.  Denies nausea, vomiting, constipation, diarrhea.  He has no recent weight loss or night sweats.  The mass in the scrotal area is unchanged and not really causing him any discomfort.  The patient is here for evaluation prior to cycle 13 of his treatment and to discuss his restaging CT scan, MRI of the brain, and ultrasound of the scrotum.  MEDICAL HISTORY: Past Medical History:  Diagnosis Date  . Anemia 11/28/2015  . Anxiety   . Arthritis    "hands, back" (11/28/2015)  . Barrett's esophagus 07/01/2013   Without dysplasia on biopsy 09/03/2012. Repeat EGD recommended 08/2015  . Bilateral cataracts 02/13/2017  . Carotid artery stenosis 07/01/2013   Requiring right sided stent   . Chronic pain syndrome 07/01/2013  . Closed head injury with brief loss of consciousness ([place]) 07/22/2010   Head trapped in a hydraulic device at work.  Fracture of orbital bones on right and brief loss of consciousness per report.  . 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 There was likely a strong psycho-social overlay in regard to the cognitive deficits in the form of mood disorder with psychotic features and mixed anxiety symptomatology. His primary tested cognitive deficits are in the areas of attention, executi  . Daily headache "since 07/2010"   constantly  . Degenerative joint disease of cervical  spine 07/01/2013  . Dehydration 04/11/2016  . Dupuytren's contracture of both hands 04/08/2014  . Encounter for  antineoplastic chemotherapy 10/05/2015  . Encounter for antineoplastic immunotherapy 05/24/2016  . Erectile dysfunction associated with type 2 diabetes mellitus ([place]) 07/01/2013  . Fibromyalgia 07/01/2013  . Goals of care, counseling/discussion 03/28/2016  . History of blood transfusion 11/28/2015   "suppose to get his first today" (11/28/2015)  . Hyperlipidemia LDL goal < 100 07/01/2013  . Intractable hiccups 04/11/2016  . 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 Memory changes    "memory issues" from head injury  . Moderate protein-calorie malnutrition ([place]) 11/29/2015  . Osteoarthritis of right thumb 10/21/2014  . Peripheral vascular occlusive disease ([place]) 07/01/2013   Requiring 2 arterial stents above the left knee per report  . Pneumonia ~ 2006/2007  . Post traumatic stress disorder 07/01/2013  . Primary lung adenocarcinoma ([place]) dx'd 08/2015   "right lung"  . Severe major depression with psychotic features ([place]) 04/15/2011  . Tobacco abuse 07/01/2013  . Tobacco abuse   . Type 2 diabetes mellitus with vascular disease ([place]) 07/01/2013   Left lower extremity and right carotid stenting    ALLERGIES:  is allergic to gabapentin; lyrica [pregabalin]; celebrex [celecoxib]; and contrast media [iodinated diagnostic agents].  MEDICATIONS:  Current Outpatient Medications  Medication Sig Dispense Refill  . aspirin EC 81 MG tablet Take 81 mg by mouth daily.     Marland Kitchen atorvastatin (LIPITOR) 40 MG tablet Take 1 tablet (40 mg total) by mouth daily. 90 tablet 3  . glipiZIDE (GLUCOTROL) 10 MG tablet Take 2 tablets (20 mg total) by mouth 2 (two) times daily before a meal. 360 tablet 3  . lidocaine-prilocaine (EMLA) cream Apply 1 application topically as needed. 30 g 0  . lisinopril (PRINIVIL,ZESTRIL) 5 MG tablet Take 0.5 tablets (2.5 mg total) by mouth daily. 90 tablet 3  . metFORMIN (GLUCOPHAGE-XR) 500 MG 24 hr tablet Take 1 tablet (500 mg total) by mouth 2 (two) times daily. 180 tablet 3  .  oxyCODONE-acetaminophen (PERCOCET) 10-325 MG tablet Take 1-2 tablets by mouth every 6 (six) hours as needed for pain. 240 tablet 0  . pantoprazole (PROTONIX) 40 MG tablet Take 1 tablet (40 mg total) by mouth daily. 90 tablet 3  . senna-docusate (SENOKOT-S) 8.6-50 MG tablet Take 1-2 tablets by mouth 2 (two) times daily as needed for mild constipation or moderate constipation. 30 tablet 1  . sitaGLIPtin (JANUVIA) 100 MG tablet Take 1 tablet (100 mg total) by mouth daily. 90 tablet 3  . clopidogrel (PLAVIX) 75 MG tablet Take 75 mg by mouth at bedtime.     . cyclobenzaprine (FLEXERIL) 10 MG tablet Take 1 tablet (10 mg total) by mouth 3 (three) times daily as needed for muscle spasms. (Patient not taking: Reported on 02/13/2017) 90 tablet 11  . diphenhydrAMINE (BENADRYL) 25 mg capsule Take 2 capsules (50 mg total) by mouth as directed. Take prior to CT scan as directed (Patient not taking: Reported on 01/02/2017) 30 capsule 0  . dronabinol (MARINOL) 2.5 MG capsule Take 1 capsule (2.5 mg total) by mouth 2 (two) times daily before a meal. (Patient not taking: Reported on 01/02/2017) 60 capsule 0  . metoCLOPramide (REGLAN) 10 MG tablet TAKE 1 TABLET BY MOUTH FOUR TIMES DAILY AS NEEDED AS DIRECTED (Patient not taking: Reported on 01/31/2017) 30 tablet 0  . ondansetron (ZOFRAN) 4 MG tablet Take 1 tablet (4 mg total) by mouth 3 (three) times daily. (Patient not taking: Reported on 01/02/2017)  20 tablet 0  . predniSONE (DELTASONE) 50 MG tablet Take 1 tablet (50 mg total) by mouth as directed. Take 50 mg 13 hours. 7 hours and 1 hour prior to scan (Patient not taking: Reported on 01/02/2017) 3 tablet 2  . prochlorperazine (COMPAZINE) 10 MG tablet Take 1 tablet (10 mg total) by mouth every 6 (six) hours as needed for nausea or vomiting. (Patient not taking: Reported on 01/02/2017) 30 tablet 0  . prochlorperazine (COMPAZINE) 25 MG suppository Place 1 suppository (25 mg total) rectally every 12 (twelve) hours as needed for  refractory nausea / vomiting. (Patient not taking: Reported on 01/02/2017) 12 suppository 11  . sorbitol 70 % solution Take 15 cc every 4 hours until bowel movement (Patient not taking: Reported on 01/31/2017) 473 mL 0   No current facility-administered medications for this visit.    Facility-Administered Medications Ordered in Other Visits  Medication Dose Route Frequency Provider Last Rate Last Dose  . sodium chloride flush (NS) 0.9 % injection 10 mL  10 mL Intracatheter PRN Curt Bears, MD   10 mL at 02/13/17 1338    SURGICAL HISTORY:  Past Surgical History:  Procedure Laterality Date  . CAROTID STENT Right ?2014  . COLONOSCOPY N/A 11/30/2015   Procedure: COLONOSCOPY;  Surgeon: Teena Irani, MD;  Location: [facility] ENDOSCOPY;  Service: Endoscopy;  Laterality: N/A;  . ESOPHAGOGASTRODUODENOSCOPY N/A 11/30/2015   Procedure: ESOPHAGOGASTRODUODENOSCOPY (EGD);  Surgeon: Teena Irani, MD;  Location: [facility] ENDOSCOPY;  Service: Endoscopy;  Laterality: N/A;  . ESOPHAGOGASTRODUODENOSCOPY (EGD) WITH PROPOFOL N/A 01/05/2016   Procedure: ESOPHAGOGASTRODUODENOSCOPY (EGD) WITH PROPOFOL;  Surgeon: Teena Irani, MD;  Location: [facility] ENDOSCOPY;  Service: Endoscopy;  Laterality: N/A;  . FEMORAL ARTERY STENT Left 05/2012; ~ 2015   Archie Endo 06/04/2012; Raechel Chute report  . FRACTURE SURGERY    . GIVENS CAPSULE STUDY N/A 12/22/2015   Procedure: GIVENS CAPSULE STUDY;  Surgeon: Wonda Horner, MD;  Location: [facility] ENDOSCOPY;  Service: Endoscopy;  Laterality: N/A;  . HARDWARE REMOVAL Right 11/15/2011   Removal of deep frontozygomatic orbital hardware/notes 11/15/2011  . HERNIA REPAIR  [id number]   Umbilical  . LAPAROSCOPY N/A 02/27/2016   Procedure: LAPAROSCOPY, LAPAROTOMY  WITH TWO SMALL BOWEL RESECTION;  Surgeon: Johnathan Hausen, MD;  Location: [facility] ORS;  Service: General;  Laterality: N/A;  . ORIF ORBITAL FRACTURE Right 08/15/2010    caught in a hydraulic machine; open reduction internal fixation of orbital rim fracture and open reduction of  zygomatic arch fracture  Archie Endo 10/13/2009  . PORTACATH PLACEMENT Left 04/04/2016   Procedure: INSERTION PORT-A-CATH LEFT CHEST;  Surgeon: Melrose Nakayama, MD;  Location: [facility];  Service: Thoracic;  Laterality: Left;  Marland Kitchen VIDEO ASSISTED THORACOSCOPY (VATS)/ LOBECTOMY Right 10/18/2015   Procedure: VIDEO ASSISTED THORACOSCOPY (VATS)/ LOBECTOMY;  Surgeon: Melrose Nakayama, MD;  Location: [facility];  Service: Thoracic;  Laterality: Right;  Marland Kitchen VIDEO BRONCHOSCOPY Bilateral 09/21/2015   Procedure: VIDEO BRONCHOSCOPY WITH FLUORO;  Surgeon: Juanito Doom, MD;  Location: [facility] ENDOSCOPY;  Service: Cardiopulmonary;  Laterality: Bilateral;    REVIEW OF SYSTEMS:   Review of Systems  Constitutional: Negative for appetite change, chills, fatigue, fever and unexpected weight change.  HENT:   Negative for mouth sores, nosebleeds, sore throat and trouble swallowing.   Eyes: Negative for icterus. Positive for blurred vision. Respiratory: Negative for cough, hemoptysis, shortness of breath and wheezing.   Cardiovascular: Negative for chest pain and leg swelling.  Gastrointestinal: Negative for abdominal pain, constipation, diarrhea, nausea and vomiting.  Genitourinary: Negative for  bladder incontinence, difficulty urinating, dysuria, frequency and hematuria.  Positive for mass on the scrotum.   Musculoskeletal: Negative for back pain, gait problem, neck pain and neck stiffness.  Skin: Negative for itching and rash.  Neurological: Negative for dizziness, extremity weakness, gait problem, light-headedness and seizures. Positive for headaches which are unchanged. Hematological: Negative for adenopathy. Does not bruise/bleed easily.  Psychiatric/Behavioral: Negative for confusion, depression and sleep disturbance. The patient is not nervous/anxious.     PHYSICAL EXAMINATION:  Blood pressure 122/78, pulse (!) 104, temperature 98.2 F (36.8 C), temperature source Oral, resp. rate 18, height _0  (1.702 m), weight  119 lb 8 oz (54.2 kg), SpO2 98 %.  ECOG PERFORMANCE STATUS: 1 - Symptomatic but completely ambulatory  Physical Exam  Constitutional: Oriented to person, place, and time and well-developed, well-nourished, and in no distress. No distress.  HENT:  Head: Normocephalic and atraumatic.  Mouth/Throat: Oropharynx is clear and moist. No oropharyngeal exudate.  Eyes: Conjunctivae are normal. Right eye exhibits no discharge. Left eye exhibits no discharge. No scleral icterus.  Neck: Normal range of motion. Neck supple.  Cardiovascular: Normal rate, regular rhythm, normal heart sounds and intact distal pulses.   Pulmonary/Chest: Effort normal and breath sounds normal. No respiratory distress. No wheezes. No rales.  Abdominal: Soft. Bowel sounds are normal. Exhibits no distension and no mass. There is no tenderness.  Musculoskeletal: Normal range of motion. Exhibits no edema.  Lymphadenopathy:    No cervical adenopathy.  Neurological: Alert and oriented to person, place, and time. Exhibits normal muscle tone. Gait normal. Coordination normal.  Skin: Skin is warm and dry. No rash noted. Not diaphoretic. No erythema. No pallor.  Psychiatric: Mood, memory and judgment normal.  Vitals reviewed.  LABORATORY DATA: Lab Results  Component Value Date   WBC 7.1 02/13/2017   HGB 14.1 02/13/2017   HCT 41.9 02/13/2017   MCV 90.4 02/13/2017   PLT 223 02/13/2017      Chemistry      Component Value Date/Time   NA 139 02/13/2017 0947   NA 140 01/23/2017 0910   K 4.4 02/13/2017 0947   K 4.5 01/23/2017 0910   CL 104 02/13/2017 0947   CO2 26 02/13/2017 0947   CO2 28 01/23/2017 0910   BUN 10 02/13/2017 0947   BUN 8.1 01/23/2017 0910   CREATININE 0.83 02/13/2017 0947   CREATININE 0.8 01/23/2017 0910   GLU 398 (H) 08/08/2016 1257      Component Value Date/Time   CALCIUM 9.3 02/13/2017 0947   CALCIUM 9.6 01/23/2017 0910   ALKPHOS 94 02/13/2017 0947   ALKPHOS 113 01/23/2017 0910   AST 8 02/13/2017  0947   AST 11 01/23/2017 0910   ALT 9 02/13/2017 0947   ALT 12 01/23/2017 0910   BILITOT 0.3 02/13/2017 0947   BILITOT 0.40 01/23/2017 0910       RADIOGRAPHIC STUDIES:  Ct Chest W Contrast  Result Date: 02/10/2017 CLINICAL DATA:  Metastatic non-small cell lung cancer of the right upper lobe to the small intestine. History of wedge resection and small bowel resection. Restaging. EXAM: CT CHEST, ABDOMEN, AND PELVIS WITH CONTRAST TECHNIQUE: Multidetector CT imaging of the chest, abdomen and pelvis was performed following the standard protocol during bolus administration of intravenous contrast. CONTRAST:  168m ISOVUE-300 IOPAMIDOL (ISOVUE-300) INJECTION 61% COMPARISON:  Multiple exams, including 12/09/2016 FINDINGS: CT CHEST FINDINGS Cardiovascular: Coronary, aortic arch, and branch vessel atherosclerotic vascular disease. Left Port-A-Cath tip: SVC. Mediastinum/Nodes: Unremarkable Lungs/Pleura: Right upper lobectomy. Wedge clips peripherally  along the remaining fissure. Stable appearance of tree-in-bud type nodularity in the left upper lobe, possibly residual scarring from prior atypical infectious bronchiolitis, merit surveillance. Musculoskeletal: Thoracic spondylosis. CT ABDOMEN PELVIS FINDINGS Hepatobiliary: Mildly contracted gallbladder. Otherwise unremarkable. Pancreas: Unremarkable Spleen: Unremarkable Adrenals/Urinary Tract: Unremarkable Stomach/Bowel: Prominent stool throughout the colon favors constipation. Postoperative findings along the small bowel just to the right of midline, appearance suggesting a 1.5 cm intussusception along the anastomotic pouch site Vascular/Lymphatic: Aortoiliac atherosclerotic vascular disease. Right eccentric mesenteric lymph node 0.7 cm in short axis on image 76/2, formerly 0.9 cm. Reproductive: Clustered calcification in the right central gland. Other: No supplemental non-categorized findings. Musculoskeletal: Stable mild lumbar impingement from spondylosis and  degenerative disc disease. IMPRESSION: 1. No findings of recurrent malignancy in the chest, abdomen, or pelvis. The dominant right eccentric mesenteric lymph node measures 0.7 cm in short axis. 2. There is potentially 1.5 cm intussusception along the anastomotic pouch site in the small bowel, without obstruction. 3. Other imaging findings of potential clinical significance: Aortic Atherosclerosis (ICD10-I70.0). Coronary atherosclerosis. Right upper lobectomy. Prominent stool throughout the colon favors constipation. Electronically Signed   By: Van Clines M.D.   On: 02/10/2017 15:35   Mr Jeri Cos WN Contrast  Result Date: 02/10/2017 CLINICAL DATA:  Non-small-cell lung cancer. Here for staging. History of diabetes, hyperlipidemia, RIGHT carotid artery stenosis, head injury. EXAM: MRI HEAD WITHOUT AND WITH CONTRAST TECHNIQUE: Multiplanar, multiecho pulse sequences of the brain and surrounding structures were obtained without and with intravenous contrast. CONTRAST:  12 cc MultiHance COMPARISON:  MRI of the head April 03, 2016 FINDINGS: INTRACRANIAL CONTENTS: No reduced diffusion to suggest acute ischemia or hypercellular tumor. No susceptibility artifact to suggest hemorrhage. Mild parenchymal brain volume loss for age. A few scattered punctate supratentorial white matter FLAIR T2 hyperintensities. No suspicious parenchymal signal, masses, mass effect. No abnormal intraparenchymal or extra-axial enhancement. No abnormal extra-axial fluid collections. No extra-axial masses. Cerebellar tonsils at the foramen magnum. VASCULAR: Normal major intracranial vascular flow voids present at skull base. SKULL AND UPPER CERVICAL SPINE: No abnormal sellar expansion. No suspicious calvarial bone marrow signal. Subcentimeter RIGHT parietal intraosseous lipoma versus hemangioma. Craniocervical junction maintained. SINUSES/ORBITS: Chronic LEFT maxillary sinusitis with small RIGHT maxillary sinus air-fluid level. Included  ocular globes and orbital contents are non-suspicious. Mild medial deviation RIGHT lamina papyracea equivocal for old blowout fracture. OTHER: Patient is edentulous. IMPRESSION: 1. No acute intracranial process or intracranial metastasis. 2. Stable mild parenchymal brain volume loss for age and mild chronic small vessel ischemic disease. Electronically Signed   By: Elon Alas M.D.   On: 02/10/2017 20:10   Ct Abdomen Pelvis W Contrast  Result Date: 02/10/2017 CLINICAL DATA:  Metastatic non-small cell lung cancer of the right upper lobe to the small intestine. History of wedge resection and small bowel resection. Restaging. EXAM: CT CHEST, ABDOMEN, AND PELVIS WITH CONTRAST TECHNIQUE: Multidetector CT imaging of the chest, abdomen and pelvis was performed following the standard protocol during bolus administration of intravenous contrast. CONTRAST:  148m ISOVUE-300 IOPAMIDOL (ISOVUE-300) INJECTION 61% COMPARISON:  Multiple exams, including 12/09/2016 FINDINGS: CT CHEST FINDINGS Cardiovascular: Coronary, aortic arch, and branch vessel atherosclerotic vascular disease. Left Port-A-Cath tip: SVC. Mediastinum/Nodes: Unremarkable Lungs/Pleura: Right upper lobectomy. Wedge clips peripherally along the remaining fissure. Stable appearance of tree-in-bud type nodularity in the left upper lobe, possibly residual scarring from prior atypical infectious bronchiolitis, merit surveillance. Musculoskeletal: Thoracic spondylosis. CT ABDOMEN PELVIS FINDINGS Hepatobiliary: Mildly contracted gallbladder. Otherwise unremarkable. Pancreas: Unremarkable Spleen: Unremarkable Adrenals/Urinary Tract: Unremarkable Stomach/Bowel:  Prominent stool throughout the colon favors constipation. Postoperative findings along the small bowel just to the right of midline, appearance suggesting a 1.5 cm intussusception along the anastomotic pouch site Vascular/Lymphatic: Aortoiliac atherosclerotic vascular disease. Right eccentric mesenteric  lymph node 0.7 cm in short axis on image 76/2, formerly 0.9 cm. Reproductive: Clustered calcification in the right central gland. Other: No supplemental non-categorized findings. Musculoskeletal: Stable mild lumbar impingement from spondylosis and degenerative disc disease. IMPRESSION: 1. No findings of recurrent malignancy in the chest, abdomen, or pelvis. The dominant right eccentric mesenteric lymph node measures 0.7 cm in short axis. 2. There is potentially 1.5 cm intussusception along the anastomotic pouch site in the small bowel, without obstruction. 3. Other imaging findings of potential clinical significance: Aortic Atherosclerosis (ICD10-I70.0). Coronary atherosclerosis. Right upper lobectomy. Prominent stool throughout the colon favors constipation. Electronically Signed   By: Van Clines M.D.   On: 02/10/2017 15:35   [place] Scrotom W/doppler  Result Date: 02/10/2017 CLINICAL DATA:  Palpable abnormality in scrotum for 1 month at midline to the LEFT EXAM: SCROTAL ULTRASOUND DOPPLER ULTRASOUND OF THE TESTICLES TECHNIQUE: Complete ultrasound examination of the testicles, epididymis, and other scrotal structures was performed. Color and spectral Doppler ultrasound were also utilized to evaluate blood flow to the testicles. COMPARISON:  None. FINDINGS: Right testicle Measurements: 4.8 x 2.0 x 3.2 cm. Normal morphology without mass or calcification. Internal blood flow present on color Doppler imaging. Left testicle Measurements: 4.5 x 1.6 x 3.2 cm. Normal morphology without mass or calcification. Internal blood flow present on color Doppler imaging, symmetric with RIGHT Right epididymis:  Normal in size and appearance. Left epididymis:  Normal in size and appearance Hydrocele:  Minimal hydroceles bilaterally. Varicocele:  Absent bilaterally Pulsed Doppler interrogation of both testes demonstrates normal low resistance arterial and venous waveforms bilaterally. Sonography of the site of clinical concern  at the inferior LEFT scrotum corresponded to unremarkable appearing scrotal vessels without discrete mass, calcification or fluid collection. IMPRESSION: Normal exam. Electronically Signed   By: Lavonia Dana M.D.   On: 02/10/2017 15:47     ASSESSMENT/PLAN:  Primary adenocarcinoma of right lung [facility]) This is a very pleasant 57 year old white male with metastatic non-small cell lung cancer, adenocarcinoma status post right upper lobectomy with lymph node dissection. Unfortunately the patient was found to have metastatic disease in the jejunum and terminal ileum.  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 The patient was started on treatment with systemic chemotherapy with carboplatin and Alimta for 2 cycles discontinued secondary to intolerance and disease progression. The patient is currently undergoing treatment with second line immunotherapy with Keytruda (pembrolizumab) 200 mg IV every 3 weeks, status post 12 cycles. The patient continues to tolerate this treatment fairly well with no significant adverse effects.    The patient was seen with Dr. Julien Nordmann.  Restaging CT scan shows no evidence of disease progression.  MRI of the brain is negative for brain metastases.  Recommend that he continue on his current treatment with Keytruda.  He will proceed with cycle 13 today as scheduled.  Blurred vision is likely due to his cataracts.  Patient plans to proceed with cataract surgery in the near future.  Ultrasound of the scrotum was reviewed with the patient wishes not show any abnormality.  The mass that he feels may be related to blood vessels.  Recommend that he continue to monitor  this.  Follow-up visit will be in 3 weeks for evaluation prior to cycle 14 of treatment.  The patient was advised to call immediately if he has any concerning symptoms in the  interval. The patient voices understanding of current disease status and treatment options and is in agreement with the current care plan. All questions were answered. The patient knows to call the clinic with any problems, questions or concerns. We can certainly see the patient much sooner if necessary.  Orders Placed This Encounter  Procedures  . CBC with Differential (Cancer Center Only)    Standing Status:   Standing    Number of Occurrences:   20    Standing Expiration Date:   02/13/2018  . CMP ([place] only)    Standing Status:   Standing    Number of Occurrences:   20    Standing Expiration Date:   02/13/2018  . TSH    Standing Status:   Standing    Number of Occurrences:   20    Standing Expiration Date:   02/13/2018    Brett Bussing, DNP, AGPCNP-BC, AOCNP 02/13/17  ADDENDUM: Hematology/Oncology Attending: I had a face-to-face encounter with the patient.  I recommended his care plan.  This is a very pleasant 57 years old white male with metastatic non-small cell lung cancer. He is currently undergoing treatment with immunotherapy status post 12 cycles.  The patient has been tolerating this treatment fairly well with no concerning complaints.  Recently had.  Patient was seen by his ophthalmologist and diagnosed with strep.  He is expected to have Surgery soon. He had several studies performed recently including repeat CT scan of the chest, abdomen and pelvis as well as MRI and ultrasound of the scrotum for the questionable palpated mass. His imaging studies showed no concerning findings and the patient continues to have stable disease. I discussed the scan results with the patient and his wife. I recommended for him to continue his current treatment with [facility] and the patient will proceed with cycle #13 today. I will see him back for follow-up visit in 3 weeks for evaluation before the next cycle of his treatment. He was advised to call immediately if she has any  concerning symptoms in the interval.  Disclaimer: This note was dictated with voice recognition software. Similar sounding words can inadvertently be transcribed and may be missed upon review. Eilleen Kempf, MD 02/13/17

## 2017-02-14 ENCOUNTER — Other Ambulatory Visit: Payer: Self-pay

## 2017-02-14 DIAGNOSIS — C3491 Malignant neoplasm of unspecified part of right bronchus or lung: Secondary | ICD-10-CM

## 2017-02-14 MED ORDER — OXYCODONE-ACETAMINOPHEN 10-325 MG PO TABS: 1.0000 | ORAL_TABLET | Freq: Four times a day (QID) | ORAL | 0 refills | Status: DC | PRN

## 2017-02-14 NOTE — Telephone Encounter (Signed)
oxyCODONE-acetaminophen (PERCOCET) 10-325 MG tablet, Refill request @ [facility] in [place]. Would like a call back.

## 2017-02-18 DIAGNOSIS — E119 Type 2 diabetes mellitus without complications: Secondary | ICD-10-CM | POA: Diagnosis not present

## 2017-02-18 DIAGNOSIS — H25043 Posterior subcapsular polar age-related cataract, bilateral: Secondary | ICD-10-CM | POA: Diagnosis not present

## 2017-02-18 DIAGNOSIS — H0011 Chalazion right upper eyelid: Secondary | ICD-10-CM | POA: Diagnosis not present

## 2017-02-18 DIAGNOSIS — H5010 Unspecified exotropia: Secondary | ICD-10-CM | POA: Diagnosis not present

## 2017-02-18 DIAGNOSIS — H2513 Age-related nuclear cataract, bilateral: Secondary | ICD-10-CM | POA: Diagnosis not present

## 2017-02-28 DIAGNOSIS — H2512 Age-related nuclear cataract, left eye: Secondary | ICD-10-CM | POA: Diagnosis not present

## 2017-03-06 ENCOUNTER — Inpatient Hospital Stay: Payer: Medicare Other

## 2017-03-06 ENCOUNTER — Telehealth: Payer: Self-pay | Admitting: Internal Medicine

## 2017-03-06 ENCOUNTER — Inpatient Hospital Stay: Payer: Medicare Other | Attending: Internal Medicine | Admitting: Internal Medicine

## 2017-03-06 ENCOUNTER — Encounter: Payer: Self-pay | Admitting: Internal Medicine

## 2017-03-06 VITALS — BP 113/74 | HR 91 | Temp 98.4°F | Resp 20 | Ht 67.0 in | Wt 120.8 lb

## 2017-03-06 DIAGNOSIS — C784 Secondary malignant neoplasm of small intestine: Secondary | ICD-10-CM | POA: Insufficient documentation

## 2017-03-06 DIAGNOSIS — Z79899 Other long term (current) drug therapy: Secondary | ICD-10-CM | POA: Diagnosis not present

## 2017-03-06 DIAGNOSIS — Z5112 Encounter for antineoplastic immunotherapy: Secondary | ICD-10-CM

## 2017-03-06 DIAGNOSIS — C3411 Malignant neoplasm of upper lobe, right bronchus or lung: Secondary | ICD-10-CM | POA: Diagnosis not present

## 2017-03-06 DIAGNOSIS — C3491 Malignant neoplasm of unspecified part of right bronchus or lung: Secondary | ICD-10-CM

## 2017-03-06 DIAGNOSIS — H0013 Chalazion right eye, unspecified eyelid: Secondary | ICD-10-CM | POA: Diagnosis not present

## 2017-03-06 DIAGNOSIS — H0011 Chalazion right upper eyelid: Secondary | ICD-10-CM | POA: Diagnosis not present

## 2017-03-06 DIAGNOSIS — E1159 Type 2 diabetes mellitus with other circulatory complications: Secondary | ICD-10-CM

## 2017-03-06 LAB — CMP (CANCER CENTER ONLY)
ALT: 12 U/L (ref 0–55)
AST: 14 U/L (ref 5–34)
Albumin: 4 g/dL (ref 3.5–5.0)
Alkaline Phosphatase: 118 U/L (ref 40–150)
Anion gap: 9 (ref 3–11)
BUN: 9 mg/dL (ref 7–26)
CO2: 25 mmol/L (ref 22–29)
Calcium: 9.6 mg/dL (ref 8.4–10.4)
Chloride: 103 mmol/L (ref 98–109)
Creatinine: 0.83 mg/dL (ref 0.70–1.30)
GFR, Est AFR Am: >60 mL/min (ref >60)
GFR, Estimated: >60 mL/min (ref >60)
Glucose, Bld: 256 mg/dL — ABNORMAL HIGH (ref 70–140)
Potassium: 4.6 mmol/L (ref 3.5–5.1)
Sodium: 137 mmol/L (ref 136–145)
Total Bilirubin: 0.4 mg/dL (ref 0.2–1.2)
Total Protein: 7.3 g/dL (ref 6.4–8.3)

## 2017-03-06 LAB — CBC WITH DIFFERENTIAL (CANCER CENTER ONLY)
Basophils Absolute: 0 10*3/uL (ref 0.0–0.1)
Basophils Relative: 0 %
Eosinophils Absolute: 0.3 10*3/uL (ref 0.0–0.5)
Eosinophils Relative: 4 %
HCT: 44.5 % (ref 38.4–49.9)
Hemoglobin: 14.8 g/dL (ref 13.0–17.1)
Lymphocytes Relative: 19 %
Lymphs Abs: 1.3 10*3/uL (ref 0.9–3.3)
MCH: 30.4 pg (ref 27.2–33.4)
MCHC: 33.3 g/dL (ref 32.0–36.0)
MCV: 91.4 fL (ref 79.3–98.0)
Monocytes Absolute: 0.5 10*3/uL (ref 0.1–0.9)
Monocytes Relative: 8 %
Neutro Abs: 4.9 10*3/uL (ref 1.5–6.5)
Neutrophils Relative %: 69 %
Platelet Count: 232 10*3/uL (ref 140–400)
RBC: 4.87 MIL/uL (ref 4.20–5.82)
RDW: 13.9 % (ref 11.0–14.6)
WBC Count: 7.1 10*3/uL (ref 4.0–10.3)

## 2017-03-06 LAB — TSH: TSH: 2.791 u[IU]/mL (ref 0.320–4.118)

## 2017-03-06 MED ORDER — SODIUM CHLORIDE 0.9 % IV SOLN
200.0000 mg | Freq: Once | INTRAVENOUS | Status: AC
  Administered 2017-03-06: 200 mg via INTRAVENOUS
  Filled 2017-03-06: qty 8

## 2017-03-06 MED ORDER — HEPARIN SOD (PORK) LOCK FLUSH 100 UNIT/ML IV SOLN
500.0000 [IU] | Freq: Once | INTRAVENOUS | Status: AC | PRN
  Administered 2017-03-06: 500 [IU]
  Filled 2017-03-06: qty 5

## 2017-03-06 MED ORDER — SODIUM CHLORIDE 0.9 % IV SOLN
Freq: Once | INTRAVENOUS | Status: AC
  Administered 2017-03-06: 12:00:00 via INTRAVENOUS

## 2017-03-06 MED ORDER — SODIUM CHLORIDE 0.9% FLUSH
10.0000 mL | INTRAVENOUS | Status: DC | PRN
  Administered 2017-03-06: 10 mL
  Filled 2017-03-06: qty 10

## 2017-03-06 NOTE — Telephone Encounter (Signed)
Scheduled appt per 2/14 los - Patient to get an updated schedule next visit.

## 2017-03-06 NOTE — Patient Instructions (Signed)
[facility] Discharge Instructions for Patients Receiving Chemotherapy  Today you received the following chemotherapy agents :  Keytruda.  To help prevent nausea and vomiting after your treatment, we encourage you to take your nausea medication as prescribed.   If you develop nausea and vomiting that is not controlled by your nausea medication, call the clinic.   BELOW ARE SYMPTOMS THAT SHOULD BE REPORTED IMMEDIATELY:  *FEVER GREATER THAN 100.5 F  *CHILLS WITH OR WITHOUT FEVER  NAUSEA AND VOMITING THAT IS NOT CONTROLLED WITH YOUR NAUSEA MEDICATION  *UNUSUAL SHORTNESS OF BREATH  *UNUSUAL BRUISING OR BLEEDING  TENDERNESS IN MOUTH AND THROAT WITH OR WITHOUT PRESENCE OF ULCERS  *URINARY PROBLEMS  *BOWEL PROBLEMS  UNUSUAL RASH Items with * indicate a potential emergency and should be followed up as soon as possible.  Feel free to call the clinic should you have any questions or concerns. The clinic phone number is [phone].  Please show the CHEMO ALERT CARD at check-in to the Emergency Department and triage nurse.  

## 2017-03-06 NOTE — Progress Notes (Signed)
[place] Telephone:([phone]) [phone]   Fax:([phone]  OFFICE PROGRESS NOTE  [name], MD [address] [place] [address]  DIAGNOSIS: Stage IV (T1b, N0, M1b) non-small cell lung cancer, adenocarcinoma presented with right upper lobe lung nodule and recent metastasis to the small intestine. This was initially diagnosed in September 2017.  Genomic Alterations Identified? ERBB2 amplification - equivocal? CDKN2A p16INK4a E88* and p14ARF B510C SMARCA4 splice site [id number]>NT SPTA1 E2022* TOP2A amplification TP53 A159P Additional Findings? Microsatellite status MS-Stable Tumor Mutation Burden TMB-Intermediate; 18 Muts/Mb Additional Disease-relevant Genes with No Reportable Alterations Identified? EGFR KRAS ALK BRAF MET RET ROS1   PRIOR THERAPY:  1) Status post right VATS with right upper lobectomy and mediastinal lymph node dissection under the care of Dr. Roxan Hockey on 10/18/2015 and the final pathology was consistent with stage IA (T1b, N0, MX). 2) upper endoscopy on 01/05/2016 showed normal esophagus, normal stomach but there was occasional mass around 3.0 CM in length circumferential nonobstructing in the jejunum. The final pathology was consistent with metastatic adenocarcinoma. 3) status post laparoscopic laparotomy and resection of proximal lesion and and distal jejunum/proximal ileum under the care of Dr. Hassell Done 1 02/27/2016. 3)  Systemic chemotherapy with carboplatin for AUC of 5 and Alimta 500 MG/M2 every 3 weeks. First dose 04/04/2016. Status post 2 cycles. Last dose was given 04/21/2016 discontinued secondary to disease progression.   CURRENT THERAPY: Second line immunotherapy with Ketruda 200 mg IV every 2 weeks, first dose 05/30/2016. Status post 13 cycles.  INTERVAL HISTORY: Brett [name] 57 y.o. male returns to the clinic today for follow-up visit accompanied by his wife and daughter.  The patient is feeling fine today with  no specific complaints.  He denied having any chest pain, shortness of breath, cough or hemoptysis.  He denied having any skin rash or diarrhea.  He has no nausea or vomiting.  He denied having any significant weight loss or night sweats.  He continues to tolerate this treatment fairly well with no concerning complaints.   MEDICAL HISTORY: Past Medical History:  Diagnosis Date  . Anemia 11/28/2015  . Anxiety   . Arthritis    "hands, back" (11/28/2015)  . Barrett's esophagus 07/01/2013   Without dysplasia on biopsy 09/03/2012. Repeat EGD recommended 08/2015  . Bilateral cataracts 02/13/2017  . Carotid artery stenosis 07/01/2013   Requiring right sided stent   . Chronic pain syndrome 07/01/2013  . Closed head injury with brief loss of consciousness ([place]) 07/22/2010   Head trapped in a hydraulic device at work.  Fracture of orbital bones on right and brief loss of consciousness per report.  . 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 There was likely a strong psycho-social overlay in regard to the cognitive deficits in the form of mood disorder with psychotic features and mixed anxiety symptomatology. His primary tested cognitive deficits are in the areas of attention, executi  . Daily headache "since 07/2010"   constantly  . Degenerative joint disease of cervical spine 07/01/2013  . Dehydration 04/11/2016  . Dupuytren's contracture of both hands 04/08/2014  . Encounter for antineoplastic chemotherapy 10/05/2015  . Encounter for antineoplastic immunotherapy 05/24/2016  . Erectile dysfunction associated with type 2 diabetes mellitus ([place]) 07/01/2013  . Fibromyalgia 07/01/2013  . Goals of care, counseling/discussion 03/28/2016  . History of blood transfusion 11/28/2015   "suppose to get his  first today" (11/28/2015)  . Hyperlipidemia LDL goal < 100 07/01/2013  .  Intractable hiccups 04/11/2016  . 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 Memory changes    "memory issues" from head injury  . Moderate protein-calorie malnutrition ([place]) 11/29/2015  . Osteoarthritis of right thumb 10/21/2014  . Peripheral vascular occlusive disease ([place]) 07/01/2013   Requiring 2 arterial stents above the left knee per report  . Pneumonia ~ 2006/2007  . Post traumatic stress disorder 07/01/2013  . Primary lung adenocarcinoma ([place]) dx'd 08/2015   "right lung"  . Severe major depression with psychotic features ([place]) 04/15/2011  . Tobacco abuse 07/01/2013  . Tobacco abuse   . Type 2 diabetes mellitus with vascular disease ([place]) 07/01/2013   Left lower extremity and right carotid stenting    ALLERGIES:  is allergic to gabapentin; lyrica [pregabalin]; celebrex [celecoxib]; and contrast media [iodinated diagnostic agents].  MEDICATIONS:  Current Outpatient Medications  Medication Sig Dispense Refill  . aspirin EC 81 MG tablet Take 81 mg by mouth daily.     Marland Kitchen atorvastatin (LIPITOR) 40 MG tablet Take 1 tablet (40 mg total) by mouth daily. 90 tablet 3  . clopidogrel (PLAVIX) 75 MG tablet Take 75 mg by mouth at bedtime.     . cyclobenzaprine (FLEXERIL) 10 MG tablet Take 1 tablet (10 mg total) by mouth 3 (three) times daily as needed for muscle spasms. (Patient not taking: Reported on 02/13/2017) 90 tablet 11  . diphenhydrAMINE (BENADRYL) 25 mg capsule Take 2 capsules (50 mg total) by mouth as directed. Take prior to CT scan as directed (Patient not taking: Reported on 01/02/2017) 30 capsule 0  . dronabinol (MARINOL) 2.5 MG capsule Take 1 capsule (2.5 mg total) by mouth 2 (two) times daily before a meal. (Patient not taking: Reported on 01/02/2017) 60 capsule 0  . glipiZIDE (GLUCOTROL) 10 MG tablet Take 2 tablets (20 mg total) by mouth 2 (two) times daily before a meal. 360 tablet 3  . lidocaine-prilocaine (EMLA) cream Apply 1 application topically as needed. 30 g 0  . lisinopril  (PRINIVIL,ZESTRIL) 5 MG tablet Take 0.5 tablets (2.5 mg total) by mouth daily. 90 tablet 3  . metFORMIN (GLUCOPHAGE-XR) 500 MG 24 hr tablet Take 1 tablet (500 mg total) by mouth 2 (two) times daily. 180 tablet 3  . metoCLOPramide (REGLAN) 10 MG tablet TAKE 1 TABLET BY MOUTH FOUR TIMES DAILY AS NEEDED AS DIRECTED (Patient not taking: Reported on 01/31/2017) 30 tablet 0  . ondansetron (ZOFRAN) 4 MG tablet Take 1 tablet (4 mg total) by mouth 3 (three) times daily. (Patient not taking: Reported on 01/02/2017) 20 tablet 0  . oxyCODONE-acetaminophen (PERCOCET) 10-325 MG tablet Take 1-2 tablets by mouth every 6 (six) hours as needed for pain. 240 tablet 0  . pantoprazole (PROTONIX) 40 MG tablet Take 1 tablet (40 mg total) by mouth daily. 90 tablet 3  . predniSONE (DELTASONE) 50 MG tablet Take 1 tablet (50 mg total) by mouth as directed. Take 50 mg 13 hours. 7 hours and 1 hour prior to scan (Patient not taking: Reported on 01/02/2017) 3 tablet 2  . prochlorperazine (COMPAZINE) 10 MG tablet Take 1 tablet (10 mg total) by mouth every 6 (six) hours as needed for nausea or vomiting. (Patient not taking: Reported on 01/02/2017) 30 tablet 0  . prochlorperazine (COMPAZINE) 25 MG suppository Place 1 suppository (25 mg total) rectally every 12 (twelve) hours as needed for refractory nausea / vomiting. (Patient not taking: Reported on 01/02/2017) 12 suppository 11  .  senna-docusate (SENOKOT-S) 8.6-50 MG tablet Take 1-2 tablets by mouth 2 (two) times daily as needed for mild constipation or moderate constipation. 30 tablet 1  . sitaGLIPtin (JANUVIA) 100 MG tablet Take 1 tablet (100 mg total) by mouth daily. 90 tablet 3  . sorbitol 70 % solution Take 15 cc every 4 hours until bowel movement (Patient not taking: Reported on 01/31/2017) 473 mL 0   No current facility-administered medications for this visit.     SURGICAL HISTORY:  Past Surgical History:  Procedure Laterality Date  . CAROTID STENT Right ?2014  .  COLONOSCOPY N/A 11/30/2015   Procedure: COLONOSCOPY;  Surgeon: Teena Irani, MD;  Location: [facility] ENDOSCOPY;  Service: Endoscopy;  Laterality: N/A;  . ESOPHAGOGASTRODUODENOSCOPY N/A 11/30/2015   Procedure: ESOPHAGOGASTRODUODENOSCOPY (EGD);  Surgeon: Teena Irani, MD;  Location: [facility] ENDOSCOPY;  Service: Endoscopy;  Laterality: N/A;  . ESOPHAGOGASTRODUODENOSCOPY (EGD) WITH PROPOFOL N/A 01/05/2016   Procedure: ESOPHAGOGASTRODUODENOSCOPY (EGD) WITH PROPOFOL;  Surgeon: Teena Irani, MD;  Location: [facility] ENDOSCOPY;  Service: Endoscopy;  Laterality: N/A;  . FEMORAL ARTERY STENT Left 05/2012; ~ 2015   Archie Endo 06/04/2012; Raechel Chute report  . FRACTURE SURGERY    . GIVENS CAPSULE STUDY N/A 12/22/2015   Procedure: GIVENS CAPSULE STUDY;  Surgeon: Wonda Horner, MD;  Location: [facility] ENDOSCOPY;  Service: Endoscopy;  Laterality: N/A;  . HARDWARE REMOVAL Right 11/15/2011   Removal of deep frontozygomatic orbital hardware/notes 11/15/2011  . HERNIA REPAIR  3154   Umbilical  . LAPAROSCOPY N/A 02/27/2016   Procedure: LAPAROSCOPY, LAPAROTOMY  WITH TWO SMALL BOWEL RESECTION;  Surgeon: Johnathan Hausen, MD;  Location: [facility] ORS;  Service: General;  Laterality: N/A;  . ORIF ORBITAL FRACTURE Right 08/15/2010    caught in a hydraulic machine; open reduction internal fixation of orbital rim fracture and open reduction of zygomatic arch fracture  Archie Endo 10/13/2009  . PORTACATH PLACEMENT Left 04/04/2016   Procedure: INSERTION PORT-A-CATH LEFT CHEST;  Surgeon: Melrose Nakayama, MD;  Location: [place];  Service: Thoracic;  Laterality: Left;  Marland Kitchen VIDEO ASSISTED THORACOSCOPY (VATS)/ LOBECTOMY Right 10/18/2015   Procedure: VIDEO ASSISTED THORACOSCOPY (VATS)/ LOBECTOMY;  Surgeon: Melrose Nakayama, MD;  Location: [place];  Service: Thoracic;  Laterality: Right;  Marland Kitchen VIDEO BRONCHOSCOPY Bilateral 09/21/2015   Procedure: VIDEO BRONCHOSCOPY WITH FLUORO;  Surgeon: Juanito Doom, MD;  Location: [facility] ENDOSCOPY;  Service: Cardiopulmonary;  Laterality: Bilateral;     REVIEW OF SYSTEMS:  A comprehensive review of systems was negative.   PHYSICAL EXAMINATION: General appearance: alert, cooperative and no distress Head: Normocephalic, without obvious abnormality, atraumatic Neck: no adenopathy, no JVD, supple, symmetrical, trachea midline and thyroid not enlarged, symmetric, no tenderness/mass/nodules Lymph nodes: Cervical, supraclavicular, and axillary nodes normal. Resp: clear to auscultation bilaterally Back: symmetric, no curvature. ROM normal. No CVA tenderness. Cardio: regular rate and rhythm, S1, S2 normal, no murmur, click, rub or gallop GI: soft, non-tender; bowel sounds normal; no masses,  no organomegaly Extremities: extremities normal, atraumatic, no cyanosis or edema  ECOG PERFORMANCE STATUS: 1 - Symptomatic but completely ambulatory  Blood pressure 113/74, pulse 91, temperature 98.4 F (36.9 C), temperature source Oral, resp. rate 20, height 5' 7"  (1.702 m), weight 120 lb 12.8 oz (54.8 kg), SpO2 99 %.  LABORATORY DATA: Lab Results  Component Value Date   WBC 7.1 03/06/2017   HGB 14.1 02/13/2017   HCT 44.5 03/06/2017   MCV 91.4 03/06/2017   PLT 232 03/06/2017      Chemistry      Component Value Date/Time  NA 139 02/13/2017 0947   NA 140 01/23/2017 0910   K 4.4 02/13/2017 0947   K 4.5 01/23/2017 0910   CL 104 02/13/2017 0947   CO2 26 02/13/2017 0947   CO2 28 01/23/2017 0910   BUN 10 02/13/2017 0947   BUN 8.1 01/23/2017 0910   CREATININE 0.83 02/13/2017 0947   CREATININE 0.8 01/23/2017 0910   GLU 398 (H) 08/08/2016 1257      Component Value Date/Time   CALCIUM 9.3 02/13/2017 0947   CALCIUM 9.6 01/23/2017 0910   ALKPHOS 94 02/13/2017 0947   ALKPHOS 113 01/23/2017 0910   AST 8 02/13/2017 0947   AST 11 01/23/2017 0910   ALT 9 02/13/2017 0947   ALT 12 01/23/2017 0910   BILITOT 0.3 02/13/2017 0947   BILITOT 0.40 01/23/2017 0910       RADIOGRAPHIC STUDIES: Ct Chest W Contrast  Result Date: 02/10/2017 CLINICAL  DATA:  Metastatic non-small cell lung cancer of the right upper lobe to the small intestine. History of wedge resection and small bowel resection. Restaging. EXAM: CT CHEST, ABDOMEN, AND PELVIS WITH CONTRAST TECHNIQUE: Multidetector CT imaging of the chest, abdomen and pelvis was performed following the standard protocol during bolus administration of intravenous contrast. CONTRAST:  118m ISOVUE-300 IOPAMIDOL (ISOVUE-300) INJECTION 61% COMPARISON:  Multiple exams, including 12/09/2016 FINDINGS: CT CHEST FINDINGS Cardiovascular: Coronary, aortic arch, and branch vessel atherosclerotic vascular disease. Left Port-A-Cath tip: SVC. Mediastinum/Nodes: Unremarkable Lungs/Pleura: Right upper lobectomy. Wedge clips peripherally along the remaining fissure. Stable appearance of tree-in-bud type nodularity in the left upper lobe, possibly residual scarring from prior atypical infectious bronchiolitis, merit surveillance. Musculoskeletal: Thoracic spondylosis. CT ABDOMEN PELVIS FINDINGS Hepatobiliary: Mildly contracted gallbladder. Otherwise unremarkable. Pancreas: Unremarkable Spleen: Unremarkable Adrenals/Urinary Tract: Unremarkable Stomach/Bowel: Prominent stool throughout the colon favors constipation. Postoperative findings along the small bowel just to the right of midline, appearance suggesting a 1.5 cm intussusception along the anastomotic pouch site Vascular/Lymphatic: Aortoiliac atherosclerotic vascular disease. Right eccentric mesenteric lymph node 0.7 cm in short axis on image 76/2, formerly 0.9 cm. Reproductive: Clustered calcification in the right central gland. Other: No supplemental non-categorized findings. Musculoskeletal: Stable mild lumbar impingement from spondylosis and degenerative disc disease. IMPRESSION: 1. No findings of recurrent malignancy in the chest, abdomen, or pelvis. The dominant right eccentric mesenteric lymph node measures 0.7 cm in short axis. 2. There is potentially 1.5 cm  intussusception along the anastomotic pouch site in the small bowel, without obstruction. 3. Other imaging findings of potential clinical significance: Aortic Atherosclerosis (ICD10-I70.0). Coronary atherosclerosis. Right upper lobectomy. Prominent stool throughout the colon favors constipation. Electronically Signed   By: WVan ClinesM.D.   On: 02/10/2017 15:35   Mr BJeri CosWJOContrast  Result Date: 02/10/2017 CLINICAL DATA:  Non-small-cell lung cancer. Here for staging. History of diabetes, hyperlipidemia, RIGHT carotid artery stenosis, head injury. EXAM: MRI HEAD WITHOUT AND WITH CONTRAST TECHNIQUE: Multiplanar, multiecho pulse sequences of the brain and surrounding structures were obtained without and with intravenous contrast. CONTRAST:  12 cc MultiHance COMPARISON:  MRI of the head April 03, 2016 FINDINGS: INTRACRANIAL CONTENTS: No reduced diffusion to suggest acute ischemia or hypercellular tumor. No susceptibility artifact to suggest hemorrhage. Mild parenchymal brain volume loss for age. A few scattered punctate supratentorial white matter FLAIR T2 hyperintensities. No suspicious parenchymal signal, masses, mass effect. No abnormal intraparenchymal or extra-axial enhancement. No abnormal extra-axial fluid collections. No extra-axial masses. Cerebellar tonsils at the foramen magnum. VASCULAR: Normal major intracranial vascular flow voids present at skull base. SKULL  AND UPPER CERVICAL SPINE: No abnormal sellar expansion. No suspicious calvarial bone marrow signal. Subcentimeter RIGHT parietal intraosseous lipoma versus hemangioma. Craniocervical junction maintained. SINUSES/ORBITS: Chronic LEFT maxillary sinusitis with small RIGHT maxillary sinus air-fluid level. Included ocular globes and orbital contents are non-suspicious. Mild medial deviation RIGHT lamina papyracea equivocal for old blowout fracture. OTHER: Patient is edentulous. IMPRESSION: 1. No acute intracranial process or intracranial  metastasis. 2. Stable mild parenchymal brain volume loss for age and mild chronic small vessel ischemic disease. Electronically Signed   By: Elon Alas M.D.   On: 02/10/2017 20:10   Ct Abdomen Pelvis W Contrast  Result Date: 02/10/2017 CLINICAL DATA:  Metastatic non-small cell lung cancer of the right upper lobe to the small intestine. History of wedge resection and small bowel resection. Restaging. EXAM: CT CHEST, ABDOMEN, AND PELVIS WITH CONTRAST TECHNIQUE: Multidetector CT imaging of the chest, abdomen and pelvis was performed following the standard protocol during bolus administration of intravenous contrast. CONTRAST:  117m ISOVUE-300 IOPAMIDOL (ISOVUE-300) INJECTION 61% COMPARISON:  Multiple exams, including 12/09/2016 FINDINGS: CT CHEST FINDINGS Cardiovascular: Coronary, aortic arch, and branch vessel atherosclerotic vascular disease. Left Port-A-Cath tip: SVC. Mediastinum/Nodes: Unremarkable Lungs/Pleura: Right upper lobectomy. Wedge clips peripherally along the remaining fissure. Stable appearance of tree-in-bud type nodularity in the left upper lobe, possibly residual scarring from prior atypical infectious bronchiolitis, merit surveillance. Musculoskeletal: Thoracic spondylosis. CT ABDOMEN PELVIS FINDINGS Hepatobiliary: Mildly contracted gallbladder. Otherwise unremarkable. Pancreas: Unremarkable Spleen: Unremarkable Adrenals/Urinary Tract: Unremarkable Stomach/Bowel: Prominent stool throughout the colon favors constipation. Postoperative findings along the small bowel just to the right of midline, appearance suggesting a 1.5 cm intussusception along the anastomotic pouch site Vascular/Lymphatic: Aortoiliac atherosclerotic vascular disease. Right eccentric mesenteric lymph node 0.7 cm in short axis on image 76/2, formerly 0.9 cm. Reproductive: Clustered calcification in the right central gland. Other: No supplemental non-categorized findings. Musculoskeletal: Stable mild lumbar impingement  from spondylosis and degenerative disc disease. IMPRESSION: 1. No findings of recurrent malignancy in the chest, abdomen, or pelvis. The dominant right eccentric mesenteric lymph node measures 0.7 cm in short axis. 2. There is potentially 1.5 cm intussusception along the anastomotic pouch site in the small bowel, without obstruction. 3. Other imaging findings of potential clinical significance: Aortic Atherosclerosis (ICD10-I70.0). Coronary atherosclerosis. Right upper lobectomy. Prominent stool throughout the colon favors constipation. Electronically Signed   By: WVan ClinesM.D.   On: 02/10/2017 15:35   UKoreaScrotom W/doppler  Result Date: 02/10/2017 CLINICAL DATA:  Palpable abnormality in scrotum for 1 month at midline to the LEFT EXAM: SCROTAL ULTRASOUND DOPPLER ULTRASOUND OF THE TESTICLES TECHNIQUE: Complete ultrasound examination of the testicles, epididymis, and other scrotal structures was performed. Color and spectral Doppler ultrasound were also utilized to evaluate blood flow to the testicles. COMPARISON:  None. FINDINGS: Right testicle Measurements: 4.8 x 2.0 x 3.2 cm. Normal morphology without mass or calcification. Internal blood flow present on color Doppler imaging. Left testicle Measurements: 4.5 x 1.6 x 3.2 cm. Normal morphology without mass or calcification. Internal blood flow present on color Doppler imaging, symmetric with RIGHT Right epididymis:  Normal in size and appearance. Left epididymis:  Normal in size and appearance Hydrocele:  Minimal hydroceles bilaterally. Varicocele:  Absent bilaterally Pulsed Doppler interrogation of both testes demonstrates normal low resistance arterial and venous waveforms bilaterally. Sonography of the site of clinical concern at the inferior LEFT scrotum corresponded to unremarkable appearing scrotal vessels without discrete mass, calcification or fluid collection. IMPRESSION: Normal exam. Electronically Signed   By: MLavonia Dana  M.D.   On: 02/10/2017  15:47     ASSESSMENT AND PLAN:  This is a very pleasant 57 years old white male with metastatic non-small cell lung cancer, adenocarcinoma status post right upper lobectomy with lymph node dissection. Unfortunately the patient was found to have metastatic disease in the jejunum and terminal ileum.  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 The patient was started on treatment with systemic chemotherapy with carboplatin and Alimta for 2 cycles discontinued secondary to intolerance and disease progression. The patient is currently undergoing treatment with second line immunotherapy with Ketruda (pembrolizumab) 200 mg IV every 3 weeks, status post 13 cycles. The patient tolerated the last cycle of his treatment well with no concerning complaints. I recommended for him to proceed with cycle #14 today as a schedule. I will see him back for follow-up visit in 3 weeks for evaluation before his next cycle of the treatment. He was advised to call immediately if he has any concerning symptoms in the interval. The patient voices understanding of current disease status and treatment options and is in agreement with the current care plan. All questions were answered. The patient knows to call the clinic with any problems, questions or concerns. We can certainly see the patient much sooner if necessary.  Disclaimer: This note was dictated with voice recognition software. Similar sounding words can inadvertently be transcribed and may not be corrected upon review.

## 2017-03-11 DIAGNOSIS — H2512 Age-related nuclear cataract, left eye: Secondary | ICD-10-CM | POA: Diagnosis not present

## 2017-03-11 DIAGNOSIS — H2181 Floppy iris syndrome: Secondary | ICD-10-CM | POA: Diagnosis not present

## 2017-03-11 DIAGNOSIS — H25812 Combined forms of age-related cataract, left eye: Secondary | ICD-10-CM | POA: Diagnosis not present

## 2017-03-17 ENCOUNTER — Other Ambulatory Visit: Payer: Self-pay | Admitting: Internal Medicine

## 2017-03-17 DIAGNOSIS — E1159 Type 2 diabetes mellitus with other circulatory complications: Secondary | ICD-10-CM

## 2017-03-27 ENCOUNTER — Ambulatory Visit: Payer: Medicare Other

## 2017-03-27 ENCOUNTER — Encounter: Payer: Self-pay | Admitting: Oncology

## 2017-03-27 ENCOUNTER — Telehealth: Payer: Self-pay | Admitting: Oncology

## 2017-03-27 ENCOUNTER — Inpatient Hospital Stay: Payer: PPO | Attending: Internal Medicine | Admitting: Oncology

## 2017-03-27 ENCOUNTER — Inpatient Hospital Stay: Payer: PPO

## 2017-03-27 VITALS — BP 112/68 | HR 81 | Temp 97.9°F | Resp 18 | Ht 67.0 in | Wt 124.3 lb

## 2017-03-27 DIAGNOSIS — R21 Rash and other nonspecific skin eruption: Secondary | ICD-10-CM

## 2017-03-27 DIAGNOSIS — C3491 Malignant neoplasm of unspecified part of right bronchus or lung: Secondary | ICD-10-CM

## 2017-03-27 DIAGNOSIS — C784 Secondary malignant neoplasm of small intestine: Secondary | ICD-10-CM | POA: Diagnosis not present

## 2017-03-27 DIAGNOSIS — Z5112 Encounter for antineoplastic immunotherapy: Secondary | ICD-10-CM

## 2017-03-27 DIAGNOSIS — E119 Type 2 diabetes mellitus without complications: Secondary | ICD-10-CM

## 2017-03-27 DIAGNOSIS — C3411 Malignant neoplasm of upper lobe, right bronchus or lung: Secondary | ICD-10-CM

## 2017-03-27 DIAGNOSIS — Z79899 Other long term (current) drug therapy: Secondary | ICD-10-CM | POA: Diagnosis not present

## 2017-03-27 DIAGNOSIS — E1159 Type 2 diabetes mellitus with other circulatory complications: Secondary | ICD-10-CM

## 2017-03-27 DIAGNOSIS — C171 Malignant neoplasm of jejunum: Secondary | ICD-10-CM

## 2017-03-27 LAB — CBC WITH DIFFERENTIAL/PLATELET
Basophils Absolute: 0.1 10*3/uL (ref 0.0–0.1)
Basophils Relative: 1 %
Eosinophils Absolute: 0.2 10*3/uL (ref 0.0–0.5)
Eosinophils Relative: 3 %
HCT: 42 % (ref 38.4–49.9)
Hemoglobin: 14.1 g/dL (ref 13.0–17.1)
Lymphocytes Relative: 18 %
Lymphs Abs: 1.2 10*3/uL (ref 0.9–3.3)
MCH: 30.5 pg (ref 27.2–33.4)
MCHC: 33.5 g/dL (ref 32.0–36.0)
MCV: 90.9 fL (ref 79.3–98.0)
Monocytes Absolute: 0.6 10*3/uL (ref 0.1–0.9)
Monocytes Relative: 9 %
Neutro Abs: 4.5 10*3/uL (ref 1.5–6.5)
Neutrophils Relative %: 69 %
Platelets: 213 10*3/uL (ref 140–400)
RBC: 4.62 MIL/uL (ref 4.20–5.82)
RDW: 14.3 % (ref 11.0–14.6)
WBC: 6.5 10*3/uL (ref 4.0–10.3)

## 2017-03-27 LAB — COMPREHENSIVE METABOLIC PANEL
ALT: 13 U/L (ref 0–55)
AST: 11 U/L (ref 5–34)
Albumin: 3.9 g/dL (ref 3.5–5.0)
Alkaline Phosphatase: 109 U/L (ref 40–150)
Anion gap: 8 (ref 3–11)
BUN: 8 mg/dL (ref 7–26)
CO2: 27 mmol/L (ref 22–29)
Calcium: 9.8 mg/dL (ref 8.4–10.4)
Chloride: 102 mmol/L (ref 98–109)
Creatinine, Ser: 0.93 mg/dL (ref 0.70–1.30)
GFR calc Af Amer: >60 mL/min (ref >60)
GFR calc non Af Amer: >60 mL/min (ref >60)
Glucose, Bld: 286 mg/dL — ABNORMAL HIGH (ref 70–140)
Potassium: 4.9 mmol/L (ref 3.5–5.1)
Sodium: 137 mmol/L (ref 136–145)
Total Bilirubin: 0.4 mg/dL (ref 0.2–1.2)
Total Protein: 7.3 g/dL (ref 6.4–8.3)

## 2017-03-27 LAB — TSH: TSH: 2.889 u[IU]/mL (ref 0.320–4.118)

## 2017-03-27 MED ORDER — SODIUM CHLORIDE 0.9 % IV SOLN
Freq: Once | INTRAVENOUS | Status: AC
  Administered 2017-03-27: 11:00:00 via INTRAVENOUS

## 2017-03-27 MED ORDER — PREDNISONE 50 MG PO TABS: 50.0000 mg | ORAL_TABLET | ORAL | 2 refills | Status: DC

## 2017-03-27 MED ORDER — SODIUM CHLORIDE 0.9 % IV SOLN
200.0000 mg | Freq: Once | INTRAVENOUS | Status: AC
  Administered 2017-03-27: 200 mg via INTRAVENOUS
  Filled 2017-03-27: qty 8

## 2017-03-27 MED ORDER — HEPARIN SOD (PORK) LOCK FLUSH 100 UNIT/ML IV SOLN
500.0000 [IU] | Freq: Once | INTRAVENOUS | Status: AC | PRN
  Administered 2017-03-27: 500 [IU]
  Filled 2017-03-27: qty 5

## 2017-03-27 MED ORDER — SODIUM CHLORIDE 0.9% FLUSH
10.0000 mL | INTRAVENOUS | Status: DC | PRN
  Administered 2017-03-27: 10 mL
  Filled 2017-03-27: qty 10

## 2017-03-27 NOTE — Progress Notes (Addendum)
[facility] OFFICE PROGRESS NOTE  [name], MD [place] [address]  DIAGNOSIS: Stage IV (T1b, N0, M1b) non-small cell lung cancer, adenocarcinoma presented with right upper lobe lung nodule and recent metastasis to the small intestine. This was initially diagnosed in September 2017.  Genomic Alterations Identified? ERBB2 amplification - equivocal? CDKN2A p16INK4a E88* and p14ARF D408X SMARCA4 splice site [id number]>FW SPTA1 E2022* TOP2A amplification TP53 A159P Additional Findings? Microsatellite status MS-Stable Tumor Mutation Burden TMB-Intermediate; 18 Muts/Mb Additional Disease-relevant Genes with No Reportable Alterations Identified? EGFR KRAS ALK BRAF MET RET ROS1   PRIOR THERAPY: 1) Status post right VATS with right upper lobectomy and mediastinal lymph node dissection under the care of Dr. Roxan Hockey on 10/18/2015 and the final pathology was consistent with stage IA (T1b, N0, MX). 2) upper endoscopy on 01/05/2016 showed normal esophagus, normal stomach but there was occasional mass around 3.0 CM in length circumferential nonobstructing in the jejunum. The final pathology was consistent with metastatic adenocarcinoma. 3) status post laparoscopic laparotomy and resection of proximal lesion and and distal jejunum/proximal ileum under the care of Dr. Hassell Done 1 02/27/2016. 3)  Systemic chemotherapy with carboplatin for AUC of 5 and Alimta 500 MG/M2 every 3 weeks. First dose 04/04/2016. Status post 2 cycles. Last dose was given 04/21/2016 discontinued secondary to disease progression.  CURRENT THERAPY: Second line immunotherapy with Ketruda 200 mg IV every 3 weeks, first dose 05/30/2016. Status post 14 cycles.  INTERVAL HISTORY: Brett [name] 57 y.o. male returns for routine follow-up visit accompanied by his wife and daughter.  The patient is feeling fine today and has no specific complaints.  She denies fevers and chills.  Denies  chest pain, shortness of breath, cough, hemoptysis.  Denies skin rash and diarrhea.  Denies nausea, vomiting, constipation.  The patient is getting back some of his lost weight.  He denies night sweats.  He continues to tolerate his treatment with Keytruda fairly well.  The patient is here for evaluation prior to cycle #15 of his treatment.  MEDICAL HISTORY: Past Medical History:  Diagnosis Date  . Anemia 11/28/2015  . Anxiety   . Arthritis    "hands, back" (11/28/2015)  . Barrett's esophagus 07/01/2013   Without dysplasia on biopsy 09/03/2012. Repeat EGD recommended 08/2015  . Bilateral cataracts 02/13/2017  . Carotid artery stenosis 07/01/2013   Requiring right sided stent   . Chronic pain syndrome 07/01/2013  . Closed head injury with brief loss of consciousness ([place]) 07/22/2010   Head trapped in a hydraulic device at work.  Fracture of orbital bones on right and brief loss of consciousness per report.  . 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 There was likely a strong psycho-social overlay in regard to the cognitive deficits in the form of mood disorder with psychotic features and mixed anxiety symptomatology. His primary tested cognitive deficits are in the areas of attention, executi  . Daily headache "since 07/2010"   constantly  . Degenerative joint disease of cervical spine 07/01/2013  . Dehydration 04/11/2016  . Dupuytren's contracture of both hands 04/08/2014  . Encounter for antineoplastic chemotherapy 10/05/2015  . Encounter for antineoplastic immunotherapy 05/24/2016  . Erectile dysfunction associated with type 2 diabetes mellitus ([place]) 07/01/2013  . Fibromyalgia 07/01/2013  . Goals of care, counseling/discussion 03/28/2016  . History of blood transfusion 11/28/2015   "suppose to get his first today" (  11/28/2015)  . Hyperlipidemia LDL goal < 100  07/01/2013  . Intractable hiccups 04/11/2016  . 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 Memory changes    "memory issues" from head injury  . Moderate protein-calorie malnutrition ([place]) 11/29/2015  . Osteoarthritis of right thumb 10/21/2014  . Peripheral vascular occlusive disease ([place]) 07/01/2013   Requiring 2 arterial stents above the left knee per report  . Pneumonia ~ 2006/2007  . Post traumatic stress disorder 07/01/2013  . Primary lung adenocarcinoma ([place]) dx'd 08/2015   "right lung"  . Severe major depression with psychotic features ([place]) 04/15/2011  . Tobacco abuse 07/01/2013  . Tobacco abuse   . Type 2 diabetes mellitus with vascular disease ([place]) 07/01/2013   Left lower extremity and right carotid stenting    ALLERGIES:  is allergic to gabapentin; lyrica [pregabalin]; celebrex [celecoxib]; and contrast media [iodinated diagnostic agents].  MEDICATIONS:  Current Outpatient Medications  Medication Sig Dispense Refill  . aspirin EC 81 MG tablet Take 81 mg by mouth daily.     Marland Kitchen atorvastatin (LIPITOR) 40 MG tablet Take 1 tablet (40 mg total) by mouth daily. 90 tablet 3  . cyclobenzaprine (FLEXERIL) 10 MG tablet Take 1 tablet (10 mg total) by mouth 3 (three) times daily as needed for muscle spasms. 90 tablet 11  . dronabinol (MARINOL) 2.5 MG capsule Take 1 capsule (2.5 mg total) by mouth 2 (two) times daily before a meal. 60 capsule 0  . glipiZIDE (GLUCOTROL) 10 MG tablet Take 2 tablets (20 mg total) by mouth 2 (two) times daily before a meal. 360 tablet 3  . lisinopril (PRINIVIL,ZESTRIL) 5 MG tablet Take 0.5 tablets (2.5 mg total) by mouth daily. 90 tablet 3  . metFORMIN (GLUCOPHAGE-XR) 500 MG 24 hr tablet Take 1 tablet (500 mg total) by mouth 2 (two) times daily. 180 tablet 3  . ondansetron (ZOFRAN) 4 MG tablet Take 1 tablet (4 mg total) by mouth 3 (three) times daily. 20 tablet 0  . oxyCODONE-acetaminophen (PERCOCET) 10-325 MG tablet Take 1-2 tablets by mouth every 6 (six) hours as  needed for pain. 240 tablet 0  . pantoprazole (PROTONIX) 40 MG tablet Take 1 tablet (40 mg total) by mouth daily. 90 tablet 3  . sitaGLIPtin (JANUVIA) 100 MG tablet Take 1 tablet (100 mg total) by mouth daily. 90 tablet 3  . clopidogrel (PLAVIX) 75 MG tablet Take 75 mg by mouth at bedtime.     . diphenhydrAMINE (BENADRYL) 25 mg capsule Take 2 capsules (50 mg total) by mouth as directed. Take prior to CT scan as directed (Patient not taking: Reported on 01/02/2017) 30 capsule 0  . erythromycin ophthalmic ointment Place 1 application into both eyes 2 (two) times daily.  1  . lidocaine-prilocaine (EMLA) cream Apply 1 application topically as needed. (Patient not taking: Reported on 03/27/2017) 30 g 0  . metoCLOPramide (REGLAN) 10 MG tablet TAKE 1 TABLET BY MOUTH FOUR TIMES DAILY AS NEEDED AS DIRECTED (Patient not taking: Reported on 03/27/2017) 30 tablet 0  . predniSONE (DELTASONE) 50 MG tablet Take 1 tablet (50 mg total) by mouth as directed. Take 50 mg 13 hours. 7 hours and 1 hour prior to scan 3 tablet 2  . prochlorperazine (COMPAZINE) 10 MG tablet Take 10 mg by mouth every 6 (six) hours as needed for nausea or vomiting.    . prochlorperazine (COMPAZINE) 25 MG suppository Place 1 suppository (25 mg total) rectally every 12 (twelve) hours as needed for refractory nausea / vomiting. (Patient not taking:  Reported on 01/02/2017) 12 suppository 11  . PROLENSA 0.07 % SOLN Place 1 drop into both eyes 2 (two) times daily.  0  . senna-docusate (SENOKOT-S) 8.6-50 MG tablet Take 1-2 tablets by mouth 2 (two) times daily as needed for mild constipation or moderate constipation. (Patient not taking: Reported on 03/27/2017) 30 tablet 1  . sorbitol 70 % solution Take 15 cc every 4 hours until bowel movement (Patient not taking: Reported on 01/31/2017) 473 mL 0   No current facility-administered medications for this visit.     SURGICAL HISTORY:  Past Surgical History:  Procedure Laterality Date  . CAROTID STENT Right  ?2014  . COLONOSCOPY N/A 11/30/2015   Procedure: COLONOSCOPY;  Surgeon: Teena Irani, MD;  Location: [facility] ENDOSCOPY;  Service: Endoscopy;  Laterality: N/A;  . ESOPHAGOGASTRODUODENOSCOPY N/A 11/30/2015   Procedure: ESOPHAGOGASTRODUODENOSCOPY (EGD);  Surgeon: Teena Irani, MD;  Location: [facility] ENDOSCOPY;  Service: Endoscopy;  Laterality: N/A;  . ESOPHAGOGASTRODUODENOSCOPY (EGD) WITH PROPOFOL N/A 01/05/2016   Procedure: ESOPHAGOGASTRODUODENOSCOPY (EGD) WITH PROPOFOL;  Surgeon: Teena Irani, MD;  Location: [facility] ENDOSCOPY;  Service: Endoscopy;  Laterality: N/A;  . FEMORAL ARTERY STENT Left 05/2012; ~ 2015   Archie Endo 06/04/2012; Raechel Chute report  . FRACTURE SURGERY    . GIVENS CAPSULE STUDY N/A 12/22/2015   Procedure: GIVENS CAPSULE STUDY;  Surgeon: Wonda Horner, MD;  Location: [facility] ENDOSCOPY;  Service: Endoscopy;  Laterality: N/A;  . HARDWARE REMOVAL Right 11/15/2011   Removal of deep frontozygomatic orbital hardware/notes 11/15/2011  . HERNIA REPAIR  8315   Umbilical  . LAPAROSCOPY N/A 02/27/2016   Procedure: LAPAROSCOPY, LAPAROTOMY  WITH TWO SMALL BOWEL RESECTION;  Surgeon: Johnathan Hausen, MD;  Location: [facility] ORS;  Service: General;  Laterality: N/A;  . ORIF ORBITAL FRACTURE Right 08/15/2010    caught in a hydraulic machine; open reduction internal fixation of orbital rim fracture and open reduction of zygomatic arch fracture  Archie Endo 10/13/2009  . PORTACATH PLACEMENT Left 04/04/2016   Procedure: INSERTION PORT-A-CATH LEFT CHEST;  Surgeon: Melrose Nakayama, MD;  Location: [facility];  Service: Thoracic;  Laterality: Left;  Marland Kitchen VIDEO ASSISTED THORACOSCOPY (VATS)/ LOBECTOMY Right 10/18/2015   Procedure: VIDEO ASSISTED THORACOSCOPY (VATS)/ LOBECTOMY;  Surgeon: Melrose Nakayama, MD;  Location: [facility];  Service: Thoracic;  Laterality: Right;  Marland Kitchen VIDEO BRONCHOSCOPY Bilateral 09/21/2015   Procedure: VIDEO BRONCHOSCOPY WITH FLUORO;  Surgeon: Juanito Doom, MD;  Location: [facility] ENDOSCOPY;  Service: Cardiopulmonary;  Laterality:  Bilateral;    REVIEW OF SYSTEMS:   Review of Systems  Constitutional: Negative for appetite change, chills, fatigue, fever and unexpected weight change.  HENT:   Negative for mouth sores, nosebleeds, sore throat and trouble swallowing.   Eyes: Negative for eye problems and icterus.  Respiratory: Negative for cough, hemoptysis, shortness of breath and wheezing.   Cardiovascular: Negative for chest pain and leg swelling.  Gastrointestinal: Negative for abdominal pain, constipation, diarrhea, nausea and vomiting.  Genitourinary: Negative for bladder incontinence, difficulty urinating, dysuria, frequency and hematuria.   Musculoskeletal: Negative for back pain, gait problem, neck pain and neck stiffness.  Skin: Negative for itching and rash.  Neurological: Negative for dizziness, extremity weakness, gait problem, headaches, light-headedness and seizures.  Hematological: Negative for adenopathy. Does not bruise/bleed easily.  Psychiatric/Behavioral: Negative for confusion, depression and sleep disturbance. The patient is not nervous/anxious.     PHYSICAL EXAMINATION:  Blood pressure 112/68, pulse 81, temperature 97.9 F (36.6 C), temperature source Oral, resp. rate 18, height _0  (1.702 m), weight 124 lb 4.8  oz (56.4 kg), SpO2 100 %.  ECOG PERFORMANCE STATUS: 1 - Symptomatic but completely ambulatory  Physical Exam  Constitutional: Oriented to person, place, and time and well-developed, well-nourished, and in no distress. No distress.  HENT:  Head: Normocephalic and atraumatic.  Mouth/Throat: Oropharynx is clear and moist. No oropharyngeal exudate.  Eyes: Conjunctivae are normal. Right eye exhibits no discharge. Left eye exhibits no discharge. No scleral icterus.  Neck: Normal range of motion. Neck supple.  Cardiovascular: Normal rate, regular rhythm, normal heart sounds and intact distal pulses.   Pulmonary/Chest: Effort normal and breath sounds normal. No respiratory distress. No  wheezes. No rales.  Abdominal: Soft. Bowel sounds are normal. Exhibits no distension and no mass. There is no tenderness.  Musculoskeletal: Normal range of motion. Exhibits no edema.  Lymphadenopathy:    No cervical adenopathy.  Neurological: Alert and oriented to person, place, and time. Exhibits normal muscle tone. Gait normal. Coordination normal.  Skin: Skin is warm and dry. No rash noted. Not diaphoretic. No erythema. No pallor.  Psychiatric: Mood, memory and judgment normal.  Vitals reviewed.  LABORATORY DATA: Lab Results  Component Value Date   WBC 6.5 03/27/2017   HGB 14.1 03/27/2017   HCT 42.0 03/27/2017   MCV 90.9 03/27/2017   PLT 213 03/27/2017      Chemistry      Component Value Date/Time   NA 137 03/27/2017 0945   NA 140 01/23/2017 0910   K 4.9 03/27/2017 0945   K 4.5 01/23/2017 0910   CL 102 03/27/2017 0945   CO2 27 03/27/2017 0945   CO2 28 01/23/2017 0910   BUN 8 03/27/2017 0945   BUN 8.1 01/23/2017 0910   CREATININE 0.93 03/27/2017 0945   CREATININE 0.83 03/06/2017 0941   CREATININE 0.8 01/23/2017 0910   GLU 398 (H) 08/08/2016 1257      Component Value Date/Time   CALCIUM 9.8 03/27/2017 0945   CALCIUM 9.6 01/23/2017 0910   ALKPHOS 109 03/27/2017 0945   ALKPHOS 113 01/23/2017 0910   AST 11 03/27/2017 0945   AST 14 03/06/2017 0941   AST 11 01/23/2017 0910   ALT 13 03/27/2017 0945   ALT 12 03/06/2017 0941   ALT 12 01/23/2017 0910   BILITOT 0.4 03/27/2017 0945   BILITOT 0.4 03/06/2017 0941   BILITOT 0.40 01/23/2017 0910       RADIOGRAPHIC STUDIES:  No results found.   ASSESSMENT/PLAN:  Primary adenocarcinoma of right lung [facility]) This is a very pleasant 57 year old white male with metastatic non-small cell lung cancer, adenocarcinoma status post right upper lobectomy with lymph node dissection. Unfortunately the patient was found to have metastatic disease in the jejunum and terminal ileum.  He underwent surgical resection of the proximal and  distal jejunum as well as the proximal ileum and the final pathology was consistent with high-grade neuroendocrine carcinoma. The patient was started on treatment with systemic chemotherapy with carboplatin and Alimta for 2 cycles discontinued secondary to intolerance and disease progression. The patient is currently undergoing treatment with second line immunotherapy with Ketruda (pembrolizumab) 200 mg IV every 3 weeks, status post 14 cycles. The patient tolerated the last cycle of his treatment well with no concerning complaints. I recommended for him to proceed with cycle #15 today as a scheduled. The patient will have a restaging CT scan of the chest, abdomen, pelvis prior to his next visit. Follow-up visit will be in 3 weeks for evaluation prior to cycle #16 and to review his restaging  CT scan results.  The patient was provided a prescription for prednisone as premedication to his CT scan given his history of rash.  He is also aware that he needs to take Benadryl with this.  The patient's blood sugar is elevated today but this is nonfasting.  He was advised to continue to take his medication for his diabetes and follow-up with his primary care provider.  He was advised to call immediately if he has any concerning symptoms in the interval. The patient voices understanding of current disease status and treatment options and is in agreement with the current care plan. All questions were answered. The patient knows to call the clinic with any problems, questions or concerns. We can certainly see the patient much sooner if necessary.  Orders Placed This Encounter  Procedures  . CT ABDOMEN PELVIS W CONTRAST    Standing Status:   Future    Standing Expiration Date:   03/28/2018    Order Specific Question:   If indicated for the ordered procedure, I authorize the administration of contrast media per Radiology protocol    Answer:   Yes    Order Specific Question:   Preferred imaging location?     Answer:   [facility]    Order Specific Question:   Radiology Contrast Protocol - do NOT remove file path    Answer:   \\[email or web address]    Order Specific Question:   Reason for Exam additional comments    Answer:   Stage IV lung cancer. Restaging.  . CT CHEST W CONTRAST    Standing Status:   Future    Standing Expiration Date:   03/28/2018    Order Specific Question:   If indicated for the ordered procedure, I authorize the administration of contrast media per Radiology protocol    Answer:   Yes    Order Specific Question:   Preferred imaging location?    Answer:   [facility]    Order Specific Question:   Radiology Contrast Protocol - do NOT remove file path    Answer:   \\[email or web address]    Order Specific Question:   Reason for Exam additional comments    Answer:   Stage IV lung cancer. Restaging.    Mikey Bussing, DNP, AGPCNP-BC, AOCNP 03/27/17

## 2017-03-27 NOTE — Telephone Encounter (Signed)
3 cycles already scheduled per 3/7 los.

## 2017-03-27 NOTE — Patient Instructions (Signed)
[facility] Discharge Instructions for Patients Receiving Chemotherapy  Today you received the following chemotherapy agents: Pembrolizumab [name]).  To help prevent nausea and vomiting after your treatment, we encourage you to take your nausea medication as prescribed.  If you develop nausea and vomiting that is not controlled by your nausea medication, call the clinic.   BELOW ARE SYMPTOMS THAT SHOULD BE REPORTED IMMEDIATELY:  *FEVER GREATER THAN 100.5 F  *CHILLS WITH OR WITHOUT FEVER  NAUSEA AND VOMITING THAT IS NOT CONTROLLED WITH YOUR NAUSEA MEDICATION  *UNUSUAL SHORTNESS OF BREATH  *UNUSUAL BRUISING OR BLEEDING  TENDERNESS IN MOUTH AND THROAT WITH OR WITHOUT PRESENCE OF ULCERS  *URINARY PROBLEMS  *BOWEL PROBLEMS  UNUSUAL RASH Items with * indicate a potential emergency and should be followed up as soon as possible.  Feel free to call the clinic should you have any questions or concerns. The clinic phone number is [phone].  Please show the [place] at check-in to the Emergency Department and triage nurse.

## 2017-03-27 NOTE — Assessment & Plan Note (Addendum)
This is a very pleasant 57 year old white male with metastatic non-small cell lung cancer, adenocarcinoma status post right upper lobectomy with lymph node dissection. Unfortunately the patient was found to have metastatic disease in the jejunum and terminal ileum.  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 The patient was started on treatment with systemic chemotherapy with carboplatin and Alimta for 2 cycles discontinued secondary to intolerance and disease progression. The patient is currently undergoing treatment with second line immunotherapy with Ketruda (pembrolizumab) 200 mg IV every 3 weeks, status post 14 cycles. The patient tolerated the last cycle of his treatment well with no concerning complaints. I recommended for him to proceed with cycle #15 today as a scheduled. The patient will have a restaging CT scan of the chest, abdomen, pelvis prior to his next visit. Follow-up visit will be in 3 weeks for evaluation prior to cycle #16 and to review his restaging CT scan results.  The patient was provided a prescription for prednisone as premedication to his CT scan given his history of rash.  He is also aware that he needs to take Benadryl with this.  The patient's blood sugar is elevated today but this is nonfasting.  He was advised to continue to take his medication for his diabetes and follow-up with his primary care provider.  He was advised to call immediately if he has any concerning symptoms in the interval. The patient voices understanding of current disease status and treatment options and is in agreement with the current care plan. All questions were answered. The patient knows to call the clinic with any problems, questions or concerns. We can certainly see the patient much sooner if necessary.

## 2017-04-03 ENCOUNTER — Telehealth: Payer: Self-pay | Admitting: Internal Medicine

## 2017-04-03 NOTE — Telephone Encounter (Signed)
Patient requesting refills on Plavix and also needs pain medicine.

## 2017-04-07 ENCOUNTER — Encounter: Payer: Self-pay | Admitting: Medical Oncology

## 2017-04-07 ENCOUNTER — Telehealth: Payer: Self-pay | Admitting: Medical Oncology

## 2017-04-07 NOTE — Telephone Encounter (Signed)
Letter ready for pick up .wife notified.

## 2017-04-10 ENCOUNTER — Ambulatory Visit (HOSPITAL_COMMUNITY)
Admission: RE | Admit: 2017-04-10 | Discharge: 2017-04-10 | Disposition: A | Discharge status: Home / Self Care | Payer: PPO | Source: Ambulatory Visit | Attending: Oncology | Admitting: Oncology

## 2017-04-10 DIAGNOSIS — Z85118 Personal history of other malignant neoplasm of bronchus and lung: Secondary | ICD-10-CM | POA: Diagnosis not present

## 2017-04-10 DIAGNOSIS — R911 Solitary pulmonary nodule: Secondary | ICD-10-CM | POA: Diagnosis not present

## 2017-04-10 DIAGNOSIS — C3491 Malignant neoplasm of unspecified part of right bronchus or lung: Secondary | ICD-10-CM | POA: Insufficient documentation

## 2017-04-10 DIAGNOSIS — I880 Nonspecific mesenteric lymphadenitis: Secondary | ICD-10-CM | POA: Diagnosis not present

## 2017-04-10 MED ORDER — IOPAMIDOL (ISOVUE-300) INJECTION 61%
INTRAVENOUS | Status: AC
  Filled 2017-04-10: qty 100

## 2017-04-10 MED ORDER — IOPAMIDOL (ISOVUE-300) INJECTION 61%
100.0000 mL | Freq: Once | INTRAVENOUS | Status: AC | PRN
  Administered 2017-04-10: 100 mL via INTRAVENOUS

## 2017-04-17 ENCOUNTER — Encounter: Payer: Self-pay | Admitting: Internal Medicine

## 2017-04-17 ENCOUNTER — Inpatient Hospital Stay: Payer: PPO

## 2017-04-17 ENCOUNTER — Telehealth: Payer: Self-pay | Admitting: Internal Medicine

## 2017-04-17 ENCOUNTER — Inpatient Hospital Stay (HOSPITAL_BASED_OUTPATIENT_CLINIC_OR_DEPARTMENT_OTHER): Payer: PPO | Admitting: Internal Medicine

## 2017-04-17 VITALS — BP 144/89 | HR 83 | Temp 97.8°F | Resp 18 | Ht 67.0 in | Wt 120.0 lb

## 2017-04-17 DIAGNOSIS — Z7984 Long term (current) use of oral hypoglycemic drugs: Secondary | ICD-10-CM

## 2017-04-17 DIAGNOSIS — C3491 Malignant neoplasm of unspecified part of right bronchus or lung: Secondary | ICD-10-CM

## 2017-04-17 DIAGNOSIS — Z5112 Encounter for antineoplastic immunotherapy: Secondary | ICD-10-CM

## 2017-04-17 DIAGNOSIS — C784 Secondary malignant neoplasm of small intestine: Secondary | ICD-10-CM | POA: Diagnosis not present

## 2017-04-17 DIAGNOSIS — C3411 Malignant neoplasm of upper lobe, right bronchus or lung: Secondary | ICD-10-CM

## 2017-04-17 DIAGNOSIS — E119 Type 2 diabetes mellitus without complications: Secondary | ICD-10-CM | POA: Diagnosis not present

## 2017-04-17 DIAGNOSIS — E1159 Type 2 diabetes mellitus with other circulatory complications: Secondary | ICD-10-CM

## 2017-04-17 LAB — CMP (CANCER CENTER ONLY)
ALT: 16 U/L (ref 0–55)
AST: 11 U/L (ref 5–34)
Albumin: 4 g/dL (ref 3.5–5.0)
Alkaline Phosphatase: 102 U/L (ref 40–150)
Anion gap: 9 (ref 3–11)
BUN: 10 mg/dL (ref 7–26)
CO2: 24 mmol/L (ref 22–29)
Calcium: 9.6 mg/dL (ref 8.4–10.4)
Chloride: 103 mmol/L (ref 98–109)
Creatinine: 0.9 mg/dL (ref 0.70–1.30)
GFR, Est AFR Am: >60 mL/min (ref >60)
GFR, Estimated: >60 mL/min (ref >60)
Glucose, Bld: 298 mg/dL — ABNORMAL HIGH (ref 70–140)
Potassium: 4.5 mmol/L (ref 3.5–5.1)
Sodium: 136 mmol/L (ref 136–145)
Total Bilirubin: 0.4 mg/dL (ref 0.2–1.2)
Total Protein: 7.4 g/dL (ref 6.4–8.3)

## 2017-04-17 LAB — CBC WITH DIFFERENTIAL (CANCER CENTER ONLY)
Basophils Absolute: 0.1 10*3/uL (ref 0.0–0.1)
Basophils Relative: 1 %
Eosinophils Absolute: 0.2 10*3/uL (ref 0.0–0.5)
Eosinophils Relative: 3 %
HCT: 44.1 % (ref 38.4–49.9)
Hemoglobin: 14.7 g/dL (ref 13.0–17.1)
Lymphocytes Relative: 19 %
Lymphs Abs: 1.4 10*3/uL (ref 0.9–3.3)
MCH: 30.3 pg (ref 27.2–33.4)
MCHC: 33.3 g/dL (ref 32.0–36.0)
MCV: 91 fL (ref 79.3–98.0)
Monocytes Absolute: 0.7 10*3/uL (ref 0.1–0.9)
Monocytes Relative: 9 %
Neutro Abs: 5.2 10*3/uL (ref 1.5–6.5)
Neutrophils Relative %: 68 %
Platelet Count: 213 10*3/uL (ref 140–400)
RBC: 4.85 MIL/uL (ref 4.20–5.82)
RDW: 14.1 % (ref 11.0–14.6)
WBC Count: 7.7 10*3/uL (ref 4.0–10.3)

## 2017-04-17 LAB — TSH: TSH: 7.341 u[IU]/mL — ABNORMAL HIGH (ref 0.320–4.118)

## 2017-04-17 MED ORDER — SODIUM CHLORIDE 0.9 % IV SOLN
Freq: Once | INTRAVENOUS | Status: AC
  Administered 2017-04-17: 11:00:00 via INTRAVENOUS

## 2017-04-17 MED ORDER — SODIUM CHLORIDE 0.9 % IV SOLN
200.0000 mg | Freq: Once | INTRAVENOUS | Status: AC
  Administered 2017-04-17: 200 mg via INTRAVENOUS
  Filled 2017-04-17: qty 8

## 2017-04-17 MED ORDER — HEPARIN SOD (PORK) LOCK FLUSH 100 UNIT/ML IV SOLN
500.0000 [IU] | Freq: Once | INTRAVENOUS | Status: AC | PRN
  Administered 2017-04-17: 500 [IU]
  Filled 2017-04-17: qty 5

## 2017-04-17 MED ORDER — SODIUM CHLORIDE 0.9% FLUSH
10.0000 mL | INTRAVENOUS | Status: DC | PRN
  Administered 2017-04-17: 10 mL
  Filled 2017-04-17: qty 10

## 2017-04-17 NOTE — Telephone Encounter (Signed)
Scheduled appt per 3/28 los - patient to get an updated schedule in the treatment area.

## 2017-04-17 NOTE — Patient Instructions (Signed)
[facility] Discharge Instructions for Patients Receiving Chemotherapy  Today you received the following chemotherapy agents:  Keytruda.  To help prevent nausea and vomiting after your treatment, we encourage you to take your nausea medication as directed.   If you develop nausea and vomiting that is not controlled by your nausea medication, call the clinic.   BELOW ARE SYMPTOMS THAT SHOULD BE REPORTED IMMEDIATELY:  *FEVER GREATER THAN 100.5 F  *CHILLS WITH OR WITHOUT FEVER  NAUSEA AND VOMITING THAT IS NOT CONTROLLED WITH YOUR NAUSEA MEDICATION  *UNUSUAL SHORTNESS OF BREATH  *UNUSUAL BRUISING OR BLEEDING  TENDERNESS IN MOUTH AND THROAT WITH OR WITHOUT PRESENCE OF ULCERS  *URINARY PROBLEMS  *BOWEL PROBLEMS  UNUSUAL RASH Items with * indicate a potential emergency and should be followed up as soon as possible.  Feel free to call the clinic should you have any questions or concerns. The clinic phone number is [phone].  Please show the CHEMO ALERT CARD at check-in to the Emergency Department and triage nurse.    

## 2017-04-17 NOTE — Progress Notes (Signed)
[place] [phone]   [phone]  OFFICE PROGRESS NOTE  [name], MD [address] [place] [address]  DIAGNOSIS: Stage IV (T1b, N0, M1b) non-small cell lung cancer, adenocarcinoma presented with right upper lobe lung nodule and recent metastasis to the small intestine. This was initially diagnosed in September 2017.  Genomic Alterations Identified? ERBB2 amplification - equivocal? CDKN2A p16INK4a E88* and p14ARF Z169C SMARCA4 splice site [id number] SPTA1 E2022* TOP2A amplification TP53 A159P Additional Findings? Microsatellite status MS-Stable Tumor Mutation Burden TMB-Intermediate; 18 Muts/Mb Additional Disease-relevant Genes with No Reportable Alterations Identified? EGFR KRAS ALK BRAF MET RET ROS1   PRIOR THERAPY:  1) Status post right VATS with right upper lobectomy and mediastinal lymph node dissection under the care of Dr. Roxan Hockey on 10/18/2015 and the final pathology was consistent with stage IA (T1b, N0, MX). 2) upper endoscopy on 01/05/2016 showed normal esophagus, normal stomach but there was occasional mass around 3.0 CM in length circumferential nonobstructing in the jejunum. The final pathology was consistent with metastatic adenocarcinoma. 3) status post laparoscopic laparotomy and resection of proximal lesion and and distal jejunum/proximal ileum under the care of Dr. Hassell Done 1 02/27/2016. 3)  Systemic chemotherapy with carboplatin for AUC of 5 and Alimta 500 MG/M2 every 3 weeks. First dose 04/04/2016. Status post 2 cycles. Last dose was given 04/21/2016 discontinued secondary to disease progression.   CURRENT THERAPY: Second line immunotherapy with Ketruda 200 mg IV every 2 weeks, first dose 05/30/2016. Status post 15 cycles.  INTERVAL HISTORY: Brett [name] 57 y.o. male returns to the clinic today for follow-up visit accompanied by his wife.  The patient is feeling fine today with no specific  complaints.  He eats good but he lost 4 pounds since his last visit.  He is more active these days.  He denied having any chest pain, shortness of breath, cough or hemoptysis.  He denied having any fever or chills.  He has no nausea, vomiting, diarrhea or constipation.  He denied having any headache or visual changes.  He continues to tolerate his treatment with Keytruda fairly well.  The patient is here today after repeating CT scan of the chest, abdomen pelvis for restaging of his disease.   MEDICAL HISTORY: Past Medical History:  Diagnosis Date  . Anemia 11/28/2015  . Anxiety   . Arthritis    "hands, back" (11/28/2015)  . Barrett's esophagus 07/01/2013   Without dysplasia on biopsy 09/03/2012. Repeat EGD recommended 08/2015  . Bilateral cataracts 02/13/2017  . Carotid artery stenosis 07/01/2013   Requiring right sided stent   . Chronic pain syndrome 07/01/2013  . Closed head injury with brief loss of consciousness ([place]) 07/22/2010   Head trapped in a hydraulic device at work.  Fracture of orbital bones on right and brief loss of consciousness per report.  . 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 There was likely a strong psycho-social overlay in regard to the cognitive deficits in the form of mood disorder with psychotic features and mixed anxiety symptomatology. His primary tested cognitive deficits are in the areas of attention, executi  . Daily headache "since 07/2010"   constantly  . Degenerative joint disease of cervical spine 07/01/2013  . Dehydration 04/11/2016  . Dupuytren's contracture of both hands 04/08/2014  . Encounter for antineoplastic chemotherapy 10/05/2015  . Encounter for antineoplastic immunotherapy 05/24/2016  .  Erectile dysfunction associated with type 2 diabetes mellitus ([place]) 07/01/2013  . Fibromyalgia 07/01/2013  . Goals of care,  counseling/discussion 03/28/2016  . History of blood transfusion 11/28/2015   "suppose to get his first today" (11/28/2015)  . Hyperlipidemia LDL goal < 100 07/01/2013  . Intractable hiccups 04/11/2016  . 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 Memory changes    "memory issues" from head injury  . Moderate protein-calorie malnutrition ([place]) 11/29/2015  . Osteoarthritis of right thumb 10/21/2014  . Peripheral vascular occlusive disease ([place]) 07/01/2013   Requiring 2 arterial stents above the left knee per report  . Pneumonia ~ 2006/2007  . Post traumatic stress disorder 07/01/2013  . Primary lung adenocarcinoma ([place]) dx'd 08/2015   "right lung"  . Severe major depression with psychotic features ([place]) 04/15/2011  . Tobacco abuse 07/01/2013  . Tobacco abuse   . Type 2 diabetes mellitus with vascular disease ([place]) 07/01/2013   Left lower extremity and right carotid stenting    ALLERGIES:  is allergic to gabapentin; lyrica [pregabalin]; celebrex [celecoxib]; and contrast media [iodinated diagnostic agents].  MEDICATIONS:  Current Outpatient Medications  Medication Sig Dispense Refill  . aspirin EC 81 MG tablet Take 81 mg by mouth daily.     Marland Kitchen atorvastatin (LIPITOR) 40 MG tablet Take 1 tablet (40 mg total) by mouth daily. 90 tablet 3  . clopidogrel (PLAVIX) 75 MG tablet Take 75 mg by mouth at bedtime.     . cyclobenzaprine (FLEXERIL) 10 MG tablet Take 1 tablet (10 mg total) by mouth 3 (three) times daily as needed for muscle spasms. 90 tablet 11  . diphenhydrAMINE (BENADRYL) 25 mg capsule Take 2 capsules (50 mg total) by mouth as directed. Take prior to CT scan as directed 30 capsule 0  . dronabinol (MARINOL) 2.5 MG capsule Take 1 capsule (2.5 mg total) by mouth 2 (two) times daily before a meal. 60 capsule 0  . erythromycin ophthalmic ointment Place 1 application into both eyes 2 (two) times daily.  1  . glipiZIDE (GLUCOTROL) 10 MG tablet Take 2 tablets (20 mg total) by mouth 2 (two) times daily  before a meal. 360 tablet 3  . lidocaine-prilocaine (EMLA) cream Apply 1 application topically as needed. 30 g 0  . lisinopril (PRINIVIL,ZESTRIL) 5 MG tablet Take 0.5 tablets (2.5 mg total) by mouth daily. 90 tablet 3  . metFORMIN (GLUCOPHAGE-XR) 500 MG 24 hr tablet Take 1 tablet (500 mg total) by mouth 2 (two) times daily. 180 tablet 3  . metoCLOPramide (REGLAN) 10 MG tablet TAKE 1 TABLET BY MOUTH FOUR TIMES DAILY AS NEEDED AS DIRECTED 30 tablet 0  . ondansetron (ZOFRAN) 4 MG tablet Take 1 tablet (4 mg total) by mouth 3 (three) times daily. 20 tablet 0  . oxyCODONE-acetaminophen (PERCOCET) 10-325 MG tablet Take 1-2 tablets by mouth every 6 (six) hours as needed for pain. 240 tablet 0  . pantoprazole (PROTONIX) 40 MG tablet Take 1 tablet (40 mg total) by mouth daily. 90 tablet 3  . predniSONE (DELTASONE) 50 MG tablet Take 1 tablet (50 mg total) by mouth as directed. Take 50 mg 13 hours. 7 hours and 1 hour prior to scan 3 tablet 2  . prochlorperazine (COMPAZINE) 10 MG tablet Take 10 mg by mouth every 6 (six) hours as needed for nausea or vomiting.    . prochlorperazine (COMPAZINE) 25 MG suppository Place 1 suppository (25 mg total) rectally every 12 (twelve) hours as needed for refractory nausea / vomiting. 12 suppository 11  .  PROLENSA 0.07 % SOLN Place 1 drop into both eyes 2 (two) times daily.  0  . senna-docusate (SENOKOT-S) 8.6-50 MG tablet Take 1-2 tablets by mouth 2 (two) times daily as needed for mild constipation or moderate constipation. 30 tablet 1  . sitaGLIPtin (JANUVIA) 100 MG tablet Take 1 tablet (100 mg total) by mouth daily. 90 tablet 3  . sorbitol 70 % solution Take 15 cc every 4 hours until bowel movement 473 mL 0   No current facility-administered medications for this visit.     SURGICAL HISTORY:  Past Surgical History:  Procedure Laterality Date  . CAROTID STENT Right ?2014  . COLONOSCOPY N/A 11/30/2015   Procedure: COLONOSCOPY;  Surgeon: Teena Irani, MD;  Location: [facility]  ENDOSCOPY;  Service: Endoscopy;  Laterality: N/A;  . ESOPHAGOGASTRODUODENOSCOPY N/A 11/30/2015   Procedure: ESOPHAGOGASTRODUODENOSCOPY (EGD);  Surgeon: Teena Irani, MD;  [facility] ENDOSCOPY;  Service: Endoscopy;  Laterality: N/A;  . ESOPHAGOGASTRODUODENOSCOPY (EGD) WITH PROPOFOL N/A 01/05/2016   Procedure: ESOPHAGOGASTRODUODENOSCOPY (EGD) WITH PROPOFOL;  Surgeon: Teena Irani, MD;  Location: [facility] ENDOSCOPY;  Service: Endoscopy;  Laterality: N/A;  . FEMORAL ARTERY STENT Left 05/2012; ~ 2015   Ar[facility] Endo 06/04/2012; Raechel Chute report  . FRACTURE SURGERY    . GIVENS CAPSULE STUDY N/A 12/22/2015   Procedure: GIVENS CAPSULE STUDY;  Surgeon: Wonda Horner, MD;  Location: [facility] ENDOSCOPY;  Service: Endoscopy;  Laterality: N/A;  . HARDWARE REMOVAL Right 11/15/2011   Removal of deep frontozygomatic orbital hardware/notes 11/15/2011  . HERNIA REPAIR  7106   Umbilical  . LAPAROSCOPY N/A 02/27/2016   Procedure: LAPAROSCOPY, LAPAROTOMY  WITH TWO SMALL BOWEL RESECTION;  Surgeon: Johnathan Hausen, MD;  Location: [facility] ORS;  Service: General;  Laterality: N/A;  . ORIF ORBITAL FRACTURE Right 08/15/2010    caught in a hydraulic machine; open reduction internal fixation of orbital rim fracture and open reduction of zygomatic arch fracture  Ar[facility] Endo 10/13/2009  . PORTACATH PLACEMENT Left 04/04/2016   Procedure: INSERTION PORT-A-CATH LEFT CHEST;  Surgeon: Melrose Nakayama, MD;  Location: [facility];  Service: Thoracic;  Laterality: Left;  Marland Kitchen VIDEO ASSISTED THORACOSCOPY (VATS)/ LOBECTOMY Right 10/18/2015   Procedure: VIDEO ASSISTED THORACOSCOPY (VATS)/ LOBECTOMY;  Surgeon: Melrose Nakayama, MD;  Location: [facility];  Service: Thoracic;  Laterality: Right;  Marland Kitchen VIDEO BRONCHOSCOPY Bilateral 09/21/2015   Procedure: VIDEO BRONCHOSCOPY WITH FLUORO;  Surgeon: Juanito Doom, MD;  Location: [facility] ENDOSCOPY;  Service: Cardiopulmonary;  Laterality: Bilateral;    REVIEW OF SYSTEMS:  Constitutional: positive for weight loss Eyes: negative Ears, nose,  mouth, throat, and face: negative Respiratory: negative Cardiovascular: negative Gastrointestinal: negative Genitourinary:negative Integument/breast: negative Hematologic/lymphatic: negative Musculoskeletal:negative Neurological: negative Behavioral/Psych: negative Endocrine: negative Allergic/Immunologic: negative   PHYSICAL EXAMINATION: General appearance: alert, cooperative and no distress Head: Normocephalic, without obvious abnormality, atraumatic Neck: no adenopathy, no JVD, supple, symmetrical, trachea midline and thyroid not enlarged, symmetric, no tenderness/mass/nodules Lymph nodes: Cervical, supraclavicular, and axillary nodes normal. Resp: clear to auscultation bilaterally Back: symmetric, no curvature. ROM normal. No CVA tenderness. Cardio: regular rate and rhythm, S1, S2 normal, no murmur, click, rub or gallop GI: soft, non-tender; bowel sounds normal; no masses,  no organomegaly Extremities: extremities normal, atraumatic, no cyanosis or edema Neurologic: Alert and oriented X 3, normal strength and tone. Normal symmetric reflexes. Normal coordination and gait  ECOG PERFORMANCE STATUS: 1 - Symptomatic but completely ambulatory  Blood pressure (!) 144/89, pulse 83, temperature 97.8 F (36.6 C), temperature source Oral, resp. rate 18, height _0  (1.702 m), weight 120  lb (54.4 kg), SpO2 96 %.  LABORATORY DATA: Lab Results  Component Value Date   WBC 7.7 04/17/2017   HGB 14.1 03/27/2017   HCT 44.1 04/17/2017   MCV 91.0 04/17/2017   PLT 213 04/17/2017      Chemistry      Component Value Date/Time   NA 137 03/27/2017 0945   NA 140 01/23/2017 0910   K 4.9 03/27/2017 0945   K 4.5 01/23/2017 0910   CL 102 03/27/2017 0945   CO2 27 03/27/2017 0945   CO2 28 01/23/2017 0910   BUN 8 03/27/2017 0945   BUN 8.1 01/23/2017 0910   CREATININE 0.93 03/27/2017 0945   CREATININE 0.83 03/06/2017 0941   CREATININE 0.8 01/23/2017 0910   GLU 398 (H) 08/08/2016 1257        Component Value Date/Time   CALCIUM 9.8 03/27/2017 0945   CALCIUM 9.6 01/23/2017 0910   ALKPHOS 109 03/27/2017 0945   ALKPHOS 113 01/23/2017 0910   AST 11 03/27/2017 0945   AST 14 03/06/2017 0941   AST 11 01/23/2017 0910   ALT 13 03/27/2017 0945   ALT 12 03/06/2017 0941   ALT 12 01/23/2017 0910   BILITOT 0.4 03/27/2017 0945   BILITOT 0.4 03/06/2017 0941   BILITOT 0.40 01/23/2017 0910       RADIOGRAPHIC STUDIES: Ct Chest W Contrast  Result Date: 04/10/2017 CLINICAL DATA:  History of metastatic lung cancer.  On chemotherapy. EXAM: CT CHEST, ABDOMEN, AND PELVIS WITH CONTRAST TECHNIQUE: Multidetector CT imaging of the chest, abdomen and pelvis was performed following the standard protocol during bolus administration of intravenous contrast. CONTRAST:  198m ISOVUE-300 IOPAMIDOL (ISOVUE-300) INJECTION 61% COMPARISON:  CT CAP 02/10/2017. FINDINGS: CT CHEST FINDINGS Cardiovascular: Left anterior chest wall Port-A-Cath is present with tip terminating in the superior vena cava. Normal heart size. Dense coronary arterial vascular calcifications. Thoracic aortic vascular calcifications. Mediastinum/Nodes: No enlarged axillary, mediastinal or hilar lymphadenopathy. Normal esophagus. Lungs/Pleura: Postsurgical changes compatible with right upper lobectomy. Stable postsurgical changes. Interval increase in size of 3 mm left upper lobe nodule (image 36; series 4), previously 2 mm. No pleural effusion or pneumothorax. Musculoskeletal: Probable bone island left humeral head. Thoracic spine degenerative changes. No aggressive or acute appearing osseous lesions. CT ABDOMEN PELVIS FINDINGS Hepatobiliary: Liver is normal in size and contour. No focal hepatic lesion is identified. Gallbladder is decompressed. No intrahepatic or extrahepatic biliary ductal dilatation. Pancreas: Unremarkable Spleen: Unremarkable Adrenals/Urinary Tract: The adrenal glands are normal. Kidneys enhance symmetrically with contrast. No  hydronephrosis. Urinary bladder is unremarkable. Stomach/Bowel: No abnormal bowel wall thickening or evidence for bowel obstruction. Normal morphology of the stomach. No free fluid or free intraperitoneal air. Vascular/Lymphatic: Normal caliber abdominal aorta. Peripheral calcified atherosclerotic plaque. No retroperitoneal lymphadenopathy. Stable 7 mm small bowel mesenteric lymph node (image 74; series 2). Reproductive: Central dystrophic calcifications in the prostate. Other: None. Musculoskeletal: Lumbar spine degenerative changes. No aggressive or acute appearing osseous lesions. IMPRESSION: 1. No findings of recurrent malignancy in the chest, abdomen or pelvis. 2. Unchanged 7 mm mesenteric lymph node. 3. 3 mm left upper lobe nodule, recommend attention on follow-up. Electronically Signed   By: DLovey NewcomerM.D.   On: 04/10/2017 14:49   Ct Abdomen Pelvis W Contrast  Result Date: 04/10/2017 CLINICAL DATA:  History of metastatic lung cancer.  On chemotherapy. EXAM: CT CHEST, ABDOMEN, AND PELVIS WITH CONTRAST TECHNIQUE: Multidetector CT imaging of the chest, abdomen and pelvis was performed following the standard protocol during bolus administration of intravenous  contrast. CONTRAST:  141m ISOVUE-300 IOPAMIDOL (ISOVUE-300) INJECTION 61% COMPARISON:  CT CAP 02/10/2017. FINDINGS: CT CHEST FINDINGS Cardiovascular: Left anterior chest wall Port-A-Cath is present with tip terminating in the superior vena cava. Normal heart size. Dense coronary arterial vascular calcifications. Thoracic aortic vascular calcifications. Mediastinum/Nodes: No enlarged axillary, mediastinal or hilar lymphadenopathy. Normal esophagus. Lungs/Pleura: Postsurgical changes compatible with right upper lobectomy. Stable postsurgical changes. Interval increase in size of 3 mm left upper lobe nodule (image 36; series 4), previously 2 mm. No pleural effusion or pneumothorax. Musculoskeletal: Probable bone island left humeral head. Thoracic spine  degenerative changes. No aggressive or acute appearing osseous lesions. CT ABDOMEN PELVIS FINDINGS Hepatobiliary: Liver is normal in size and contour. No focal hepatic lesion is identified. Gallbladder is decompressed. No intrahepatic or extrahepatic biliary ductal dilatation. Pancreas: Unremarkable Spleen: Unremarkable Adrenals/Urinary Tract: The adrenal glands are normal. Kidneys enhance symmetrically with contrast. No hydronephrosis. Urinary bladder is unremarkable. Stomach/Bowel: No abnormal bowel wall thickening or evidence for bowel obstruction. Normal morphology of the stomach. No free fluid or free intraperitoneal air. Vascular/Lymphatic: Normal caliber abdominal aorta. Peripheral calcified atherosclerotic plaque. No retroperitoneal lymphadenopathy. Stable 7 mm small bowel mesenteric lymph node (image 74; series 2). Reproductive: Central dystrophic calcifications in the prostate. Other: None. Musculoskeletal: Lumbar spine degenerative changes. No aggressive or acute appearing osseous lesions. IMPRESSION: 1. No findings of recurrent malignancy in the chest, abdomen or pelvis. 2. Unchanged 7 mm mesenteric lymph node. 3. 3 mm left upper lobe nodule, recommend attention on follow-up. Electronically Signed   By: DLovey NewcomerM.D.   On: 04/10/2017 14:49     ASSESSMENT AND PLAN:  This is a very pleasant 57years old white male with metastatic non-small cell lung cancer, adenocarcinoma status post right upper lobectomy with lymph node dissection. Unfortunately the patient was found to have metastatic disease in the jejunum and terminal ileum.  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 The patient was started on treatment with systemic chemotherapy with carboplatin and Alimta for 2 cycles discontinued secondary to intolerance and disease progression. The patient is currently  undergoing treatment with second line immunotherapy with Ketruda (pembrolizumab) 200 mg IV every 3 weeks, status post 15 cycles. He tolerated the last cycle of this treatment fairly well with no concerning complaints. The patient had a repeat CT scan of the chest, abdomen and pelvis.  I personally and independently reviewed the scans and discussed the results with the patient and his wife.  His scan showed no concerning findings for disease progression. I recommended for him [facility] will proceed with cycle #16 today. For diabetes mellitus, he will continue his current treatment by his primary care physician. He will come back for follow-up visit in 3 weeks for evaluation before the next cycle of his treatment. The patient was advised to call immediately if he has any concerning symptoms in the interval. The patient voices understanding of current disease status and treatment options and is in agreement with the current care plan. All questions were answered. The patient knows to call the clinic with any problems, questions or concerns. We can certainly see the patient much sooner if necessary.  Disclaimer: This note was dictated with voice recognition software. Similar sounding words can inadvertently be transcribed and may not be corrected upon review.

## 2017-05-08 ENCOUNTER — Inpatient Hospital Stay (HOSPITAL_BASED_OUTPATIENT_CLINIC_OR_DEPARTMENT_OTHER): Payer: PPO | Admitting: Internal Medicine

## 2017-05-08 ENCOUNTER — Encounter: Payer: Self-pay | Admitting: Internal Medicine

## 2017-05-08 ENCOUNTER — Inpatient Hospital Stay: Payer: PPO | Attending: Internal Medicine

## 2017-05-08 ENCOUNTER — Telehealth: Payer: Self-pay | Admitting: Internal Medicine

## 2017-05-08 ENCOUNTER — Inpatient Hospital Stay: Payer: PPO

## 2017-05-08 VITALS — BP 112/75 | HR 76 | Temp 98.0°F | Resp 18 | Ht 67.0 in | Wt 120.2 lb

## 2017-05-08 DIAGNOSIS — E119 Type 2 diabetes mellitus without complications: Secondary | ICD-10-CM | POA: Diagnosis not present

## 2017-05-08 DIAGNOSIS — Z5112 Encounter for antineoplastic immunotherapy: Secondary | ICD-10-CM

## 2017-05-08 DIAGNOSIS — C784 Secondary malignant neoplasm of small intestine: Secondary | ICD-10-CM | POA: Insufficient documentation

## 2017-05-08 DIAGNOSIS — C3411 Malignant neoplasm of upper lobe, right bronchus or lung: Secondary | ICD-10-CM | POA: Insufficient documentation

## 2017-05-08 DIAGNOSIS — C3491 Malignant neoplasm of unspecified part of right bronchus or lung: Secondary | ICD-10-CM

## 2017-05-08 DIAGNOSIS — Z79899 Other long term (current) drug therapy: Secondary | ICD-10-CM | POA: Diagnosis not present

## 2017-05-08 DIAGNOSIS — Z7984 Long term (current) use of oral hypoglycemic drugs: Secondary | ICD-10-CM | POA: Diagnosis not present

## 2017-05-08 DIAGNOSIS — E1159 Type 2 diabetes mellitus with other circulatory complications: Secondary | ICD-10-CM

## 2017-05-08 LAB — CBC WITH DIFFERENTIAL (CANCER CENTER ONLY)
Basophils Absolute: 0.1 10*3/uL (ref 0.0–0.1)
Basophils Relative: 1 %
Eosinophils Absolute: 0.2 10*3/uL (ref 0.0–0.5)
Eosinophils Relative: 3 %
HCT: 42.4 % (ref 38.4–49.9)
Hemoglobin: 14.3 g/dL (ref 13.0–17.1)
Lymphocytes Relative: 19 %
Lymphs Abs: 1.2 10*3/uL (ref 0.9–3.3)
MCH: 30.5 pg (ref 27.2–33.4)
MCHC: 33.9 g/dL (ref 32.0–36.0)
MCV: 89.9 fL (ref 79.3–98.0)
Monocytes Absolute: 0.6 10*3/uL (ref 0.1–0.9)
Monocytes Relative: 9 %
Neutro Abs: 4.4 10*3/uL (ref 1.5–6.5)
Neutrophils Relative %: 68 %
Platelet Count: 264 10*3/uL (ref 140–400)
RBC: 4.71 MIL/uL (ref 4.20–5.82)
RDW: 14.6 % (ref 11.0–14.6)
WBC Count: 6.5 10*3/uL (ref 4.0–10.3)

## 2017-05-08 LAB — CMP (CANCER CENTER ONLY)
ALT: 13 U/L (ref 0–55)
AST: 12 U/L (ref 5–34)
Albumin: 4 g/dL (ref 3.5–5.0)
Alkaline Phosphatase: 152 U/L — ABNORMAL HIGH (ref 40–150)
Anion gap: 9 (ref 3–11)
BUN: 9 mg/dL (ref 7–26)
CO2: 28 mmol/L (ref 22–29)
Calcium: 9.8 mg/dL (ref 8.4–10.4)
Chloride: 100 mmol/L (ref 98–109)
Creatinine: 0.93 mg/dL (ref 0.70–1.30)
GFR, Est AFR Am: >60 mL/min (ref >60)
GFR, Estimated: >60 mL/min (ref >60)
Glucose, Bld: 307 mg/dL — ABNORMAL HIGH (ref 70–140)
Potassium: 4.1 mmol/L (ref 3.5–5.1)
Sodium: 137 mmol/L (ref 136–145)
Total Bilirubin: 0.3 mg/dL (ref 0.2–1.2)
Total Protein: 7.9 g/dL (ref 6.4–8.3)

## 2017-05-08 LAB — TSH: TSH: 6.956 u[IU]/mL — ABNORMAL HIGH (ref 0.320–4.118)

## 2017-05-08 MED ORDER — SODIUM CHLORIDE 0.9 % IV SOLN
200.0000 mg | Freq: Once | INTRAVENOUS | Status: AC
  Administered 2017-05-08: 200 mg via INTRAVENOUS
  Filled 2017-05-08: qty 8

## 2017-05-08 MED ORDER — SODIUM CHLORIDE 0.9% FLUSH
10.0000 mL | INTRAVENOUS | Status: DC | PRN
  Administered 2017-05-08: 10 mL
  Filled 2017-05-08: qty 10

## 2017-05-08 MED ORDER — SODIUM CHLORIDE 0.9 % IV SOLN
Freq: Once | INTRAVENOUS | Status: AC
Start: 2017-05-08 — End: 2017-05-08
  Administered 2017-05-08: 11:00:00 via INTRAVENOUS

## 2017-05-08 MED ORDER — HEPARIN SOD (PORK) LOCK FLUSH 100 UNIT/ML IV SOLN
500.0000 [IU] | Freq: Once | INTRAVENOUS | Status: AC | PRN
Start: 2017-05-08 — End: 2017-05-08
  Administered 2017-05-08: 500 [IU]
  Filled 2017-05-08: qty 5

## 2017-05-08 NOTE — Progress Notes (Signed)
[facility] [phone]   [phone]  OFFICE PROGRESS NOTE  [name], MD [address] [place] [address]  DIAGNOSIS: Stage IV (T1b, N0, M1b) non-small cell lung cancer, adenocarcinoma presented with right upper lobe lung nodule and recent metastasis to the small intestine. This was initially diagnosed in September 2017.  Genomic Alterations Identified? ERBB2 amplification - equivocal? CDKN2A p16INK4a E88* and p14ARF Q762U SMARCA4 splice site [id number]>LS SPTA1 E2022* TOP2A amplification TP53 A159P Additional Findings? Microsatellite status MS-Stable Tumor Mutation Burden TMB-Intermediate; 18 Muts/Mb Additional Disease-relevant Genes with No Reportable Alterations Identified? EGFR KRAS ALK BRAF MET RET ROS1   PRIOR THERAPY:  1) Status post right VATS with right upper lobectomy and mediastinal lymph node dissection under the care of Dr. Roxan Hockey on 10/18/2015 and the final pathology was consistent with stage IA (T1b, N0, MX). 2) upper endoscopy on 01/05/2016 showed normal esophagus, normal stomach but there was occasional mass around 3.0 CM in length circumferential nonobstructing in the jejunum. The final pathology was consistent with metastatic adenocarcinoma. 3) status post laparoscopic laparotomy and resection of proximal lesion and and distal jejunum/proximal ileum under the care of Dr. Hassell Done 1 02/27/2016. 3)  Systemic chemotherapy with carboplatin for AUC of 5 and Alimta 500 MG/M2 every 3 weeks. First dose 04/04/2016. Status post 2 cycles. Last dose was given 04/21/2016 discontinued secondary to disease progression.   CURRENT THERAPY: Second line immunotherapy with Ketruda 200 mg IV every 2 weeks, first dose 05/30/2016. Status post 16 cycles.  INTERVAL HISTORY: Brett [name] 57 y.o. male returns to the clinic today for follow-up visit.  The patient is feeling fine today with no specific complaints.  He denied  having any chest pain, shortness of breath, cough or hemoptysis.  He denied having any skin rash but has occasional itching.  He has no nausea, vomiting, diarrhea or constipation.  He denied having any fever or chills.  He has no significant weight loss or night sweats.  He continues to tolerate his treatment with Keytruda fairly well.  He is here for evaluation before starting cycle #17.   MEDICAL HISTORY: Past Medical History:  Diagnosis Date  . Anemia 11/28/2015  . Anxiety   . Arthritis    "hands, back" (11/28/2015)  . Barrett's esophagus 07/01/2013   Without dysplasia on biopsy 09/03/2012. Repeat EGD recommended 08/2015  . Bilateral cataracts 02/13/2017  . Carotid artery stenosis 07/01/2013   Requiring right sided stent   . Chronic pain syndrome 07/01/2013  . Closed head injury with brief loss of consciousness ([place]) 07/22/2010   Head trapped in a hydraulic device at work.  Fracture of orbital bones on right and brief loss of consciousness per report.  . 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 There was likely a strong psycho-social overlay in regard to the cognitive deficits in the form of mood disorder with psychotic features and mixed anxiety symptomatology. His primary tested cognitive deficits are in the areas of attention, executi  . Daily headache "since 07/2010"   constantly  . Degenerative joint disease of cervical spine 07/01/2013  . Dehydration 04/11/2016  . Dupuytren's contracture of both hands 04/08/2014  . Encounter for antineoplastic chemotherapy 10/05/2015  . Encounter for antineoplastic immunotherapy 05/24/2016  . Erectile dysfunction associated with type 2 diabetes mellitus ([place]) 07/01/2013  . Fibromyalgia 07/01/2013  . Goals of care, counseling/discussion 03/28/2016  .  History of blood transfusion 11/28/2015   "suppose to get his first  today" (11/28/2015)  . Hyperlipidemia LDL goal < 100 07/01/2013  . Intractable hiccups 04/11/2016  . 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 Memory changes    "memory issues" from head injury  . Moderate protein-calorie malnutrition ([place]) 11/29/2015  . Osteoarthritis of right thumb 10/21/2014  . Peripheral vascular occlusive disease ([place]) 07/01/2013   Requiring 2 arterial stents above the left knee per report  . Pneumonia ~ 2006/2007  . Post traumatic stress disorder 07/01/2013  . Primary lung adenocarcinoma ([place]) dx'd 08/2015   "right lung"  . Severe major depression with psychotic features ([place]) 04/15/2011  . Tobacco abuse 07/01/2013  . Tobacco abuse   . Type 2 diabetes mellitus with vascular disease ([place]) 07/01/2013   Left lower extremity and right carotid stenting    ALLERGIES:  is allergic to gabapentin; lyrica [pregabalin]; celebrex [celecoxib]; and contrast media [iodinated diagnostic agents].  MEDICATIONS:  Current Outpatient Medications  Medication Sig Dispense Refill  . aspirin EC 81 MG tablet Take 81 mg by mouth daily.     Marland Kitchen atorvastatin (LIPITOR) 40 MG tablet Take 1 tablet (40 mg total) by mouth daily. 90 tablet 3  . clopidogrel (PLAVIX) 75 MG tablet Take 75 mg by mouth at bedtime.     . cyclobenzaprine (FLEXERIL) 10 MG tablet Take 1 tablet (10 mg total) by mouth 3 (three) times daily as needed for muscle spasms. 90 tablet 11  . diphenhydrAMINE (BENADRYL) 25 mg capsule Take 2 capsules (50 mg total) by mouth as directed. Take prior to CT scan as directed 30 capsule 0  . dronabinol (MARINOL) 2.5 MG capsule Take 1 capsule (2.5 mg total) by mouth 2 (two) times daily before a meal. 60 capsule 0  . erythromycin ophthalmic ointment Place 1 application into both eyes 2 (two) times daily.  1  . glipiZIDE (GLUCOTROL) 10 MG tablet Take 2 tablets (20 mg total) by mouth 2 (two) times daily before a meal. 360 tablet 3  . lidocaine-prilocaine (EMLA) cream Apply 1 application topically as  needed. 30 g 0  . lisinopril (PRINIVIL,ZESTRIL) 5 MG tablet Take 0.5 tablets (2.5 mg total) by mouth daily. 90 tablet 3  . metFORMIN (GLUCOPHAGE-XR) 500 MG 24 hr tablet Take 1 tablet (500 mg total) by mouth 2 (two) times daily. 180 tablet 3  . metoCLOPramide (REGLAN) 10 MG tablet TAKE 1 TABLET BY MOUTH FOUR TIMES DAILY AS NEEDED AS DIRECTED 30 tablet 0  . ondansetron (ZOFRAN) 4 MG tablet Take 1 tablet (4 mg total) by mouth 3 (three) times daily. 20 tablet 0  . oxyCODONE-acetaminophen (PERCOCET) 10-325 MG tablet Take 1-2 tablets by mouth every 6 (six) hours as needed for pain. 240 tablet 0  . pantoprazole (PROTONIX) 40 MG tablet Take 1 tablet (40 mg total) by mouth daily. 90 tablet 3  . predniSONE (DELTASONE) 50 MG tablet Take 1 tablet (50 mg total) by mouth as directed. Take 50 mg 13 hours. 7 hours and 1 hour prior to scan 3 tablet 2  . prochlorperazine (COMPAZINE) 10 MG tablet Take 10 mg by mouth every 6 (six) hours as needed for nausea or vomiting.    . prochlorperazine (COMPAZINE) 25 MG suppository Place 1 suppository (25 mg total) rectally every 12 (twelve) hours as needed for refractory nausea / vomiting. 12 suppository 11  . PROLENSA 0.07 % SOLN Place 1 drop into both eyes 2 (two) times daily.  0  . senna-docusate (SENOKOT-S) 8.6-50 MG  tablet Take 1-2 tablets by mouth 2 (two) times daily as needed for mild constipation or moderate constipation. 30 tablet 1  . sitaGLIPtin (JANUVIA) 100 MG tablet Take 1 tablet (100 mg total) by mouth daily. 90 tablet 3  . sorbitol 70 % solution Take 15 cc every 4 hours until bowel movement 473 mL 0   No current facility-administered medications for this visit.     SURGICAL HISTORY:  Past Surgical History:  Procedure Laterality Date  . CAROTID STENT Right ?2014  . COLONOSCOPY N/A 11/30/2015   Procedure: COLONOSCOPY;  Surgeon: Teena Irani, MD;  Location[facility] ENDOSCOPY;  Service: Endoscopy;  Laterality: N/A;  . ESOPHAGOGASTRODUODENOSCOPY N/A 11/30/2015    Procedure: ESOPHAGOGASTRODUODENOSCOPY (EGD);  Surgeon: Teena Irani, MD;  Location[facility] ENDOSCOPY;  Service: Endoscopy;  Laterality: N/A;  . ESOPHAGOGASTRODUODENOSCOPY (EGD) WITH PROPOFOL N/A 01/05/2016   Procedure: ESOPHAGOGASTRODUODENOSCOPY (EGD) WITH PROPOFOL;  Surgeon: Teena Irani, MD;  Location: [facility] ENDOSCOPY;  Service: Endoscopy;  Laterality: N/A;  . FEMORAL ARTERY STENT Left 05/2012; ~ 2015   Ar[facility] Endo 06/04/2012; Raechel Chute report  . FRACTURE SURGERY    . GIVENS CAPSULE STUDY N/A 12/22/2015   Procedure: GIVENS CAPSULE STUDY;  Surgeon: Wonda Horner, MD;  Location[facility] ENDOSCOPY;  Service: Endoscopy;  Laterality: N/A;  . HARDWARE REMOVAL Right 11/15/2011   Removal of deep frontozygomatic orbital hardware/notes 11/15/2011  . HERNIA REPAIR  0786   Umbilical  . LAPAROSCOPY N/A 02/27/2016   Procedure: LAPAROSCOPY, LAPAROTOMY  WITH TWO SMALL BOWEL RESECTION;  Surgeon: Johnathan Hausen, MD;  Location: [facility] ORS;  Service: General;  Laterality: N/A;  . ORIF ORBITAL FRACTURE Right 08/15/2010    caught in a hydraulic machine; open reduction internal fixation of orbital rim fracture and open reduction of zygomatic arch fracture  Ar[facility] Endo 10/13/2009  . PORTACATH PLACEMENT Left 04/04/2016   Procedure: INSERTION PORT-A-CATH LEFT CHEST;  Surgeon: Melrose Nakayama, MD;  Location: [facility];  Service: Thoracic;  Laterality: Left;  Marland Kitchen VIDEO ASSISTED THORACOSCOPY (VATS)/ LOBECTOMY Right 10/18/2015   Procedure: VIDEO ASSISTED THORACOSCOPY (VATS)/ LOBECTOMY;  Surgeon: Melrose Nakayama, MD;  Location: [facility];  Service: Thoracic;  Laterality: Right;  Marland Kitchen VIDEO BRONCHOSCOPY Bilateral 09/21/2015   Procedure: VIDEO BRONCHOSCOPY WITH FLUORO;  Surgeon: Juanito Doom, MD;  Location: [facility] ENDOSCOPY;  Service: Cardiopulmonary;  Laterality: Bilateral;    REVIEW OF SYSTEMS:  A comprehensive review of systems was negative except for: Integument/breast: positive for pruritus   PHYSICAL EXAMINATION: General appearance: alert, cooperative and  no distress Head: Normocephalic, without obvious abnormality, atraumatic Neck: no adenopathy, no JVD, supple, symmetrical, trachea midline and thyroid not enlarged, symmetric, no tenderness/mass/nodules Lymph nodes: Cervical, supraclavicular, and axillary nodes normal. Resp: clear to auscultation bilaterally Back: symmetric, no curvature. ROM normal. No CVA tenderness. Cardio: regular rate and rhythm, S1, S2 normal, no murmur, click, rub or gallop GI: soft, non-tender; bowel sounds normal; no masses,  no organomegaly Extremities: extremities normal, atraumatic, no cyanosis or edema  ECOG PERFORMANCE STATUS: 1 - Symptomatic but completely ambulatory  Blood pressure 112/75, pulse 76, temperature 98 F (36.7 C), temperature source Oral, resp. rate 18, height '5\' 7"'  (1.702 m), weight 120 lb 3.2 oz (54.5 kg), SpO2 100 %.  LABORATORY DATA: Lab Results  Component Value Date   WBC 6.5 05/08/2017   HGB 14.1 03/27/2017   HCT 42.4 05/08/2017   MCV 89.9 05/08/2017   PLT 264 05/08/2017      Chemistry      Component Value Date/Time   NA 137 05/08/2017  0955   NA 140 01/23/2017 0910   K 4.1 05/08/2017 0955   K 4.5 01/23/2017 0910   CL 100 05/08/2017 0955   CO2 28 05/08/2017 0955   CO2 28 01/23/2017 0910   BUN 9 05/08/2017 0955   BUN 8.1 01/23/2017 0910   CREATININE 0.93 05/08/2017 0955   CREATININE 0.8 01/23/2017 0910   GLU 398 (H) 08/08/2016 1257      Component Value Date/Time   CALCIUM 9.8 05/08/2017 0955   CALCIUM 9.6 01/23/2017 0910   ALKPHOS 152 (H) 05/08/2017 0955   ALKPHOS 113 01/23/2017 0910   AST 12 05/08/2017 0955   AST 11 01/23/2017 0910   ALT 13 05/08/2017 0955   ALT 12 01/23/2017 0910   BILITOT 0.3 05/08/2017 0955   BILITOT 0.40 01/23/2017 0910       RADIOGRAPHIC STUDIES: Ct Chest W Contrast  Result Date: 04/10/2017 CLINICAL DATA:  History of metastatic lung cancer.  On chemotherapy. EXAM: CT CHEST, ABDOMEN, AND PELVIS WITH CONTRAST TECHNIQUE: Multidetector CT  imaging of the chest, abdomen and pelvis was performed following the standard protocol during bolus administration of intravenous contrast. CONTRAST:  142m ISOVUE-300 IOPAMIDOL (ISOVUE-300) INJECTION 61% COMPARISON:  CT CAP 02/10/2017. FINDINGS: CT CHEST FINDINGS Cardiovascular: Left anterior chest wall Port-A-Cath is present with tip terminating in the superior vena cava. Normal heart size. Dense coronary arterial vascular calcifications. Thoracic aortic vascular calcifications. Mediastinum/Nodes: No enlarged axillary, mediastinal or hilar lymphadenopathy. Normal esophagus. Lungs/Pleura: Postsurgical changes compatible with right upper lobectomy. Stable postsurgical changes. Interval increase in size of 3 mm left upper lobe nodule (image 36; series 4), previously 2 mm. No pleural effusion or pneumothorax. Musculoskeletal: Probable bone island left humeral head. Thoracic spine degenerative changes. No aggressive or acute appearing osseous lesions. CT ABDOMEN PELVIS FINDINGS Hepatobiliary: Liver is normal in size and contour. No focal hepatic lesion is identified. Gallbladder is decompressed. No intrahepatic or extrahepatic biliary ductal dilatation. Pancreas: Unremarkable Spleen: Unremarkable Adrenals/Urinary Tract: The adrenal glands are normal. Kidneys enhance symmetrically with contrast. No hydronephrosis. Urinary bladder is unremarkable. Stomach/Bowel: No abnormal bowel wall thickening or evidence for bowel obstruction. Normal morphology of the stomach. No free fluid or free intraperitoneal air. Vascular/Lymphatic: Normal caliber abdominal aorta. Peripheral calcified atherosclerotic plaque. No retroperitoneal lymphadenopathy. Stable 7 mm small bowel mesenteric lymph node (image 74; series 2). Reproductive: Central dystrophic calcifications in the prostate. Other: None. Musculoskeletal: Lumbar spine degenerative changes. No aggressive or acute appearing osseous lesions. IMPRESSION: 1. No findings of recurrent  malignancy in the chest, abdomen or pelvis. 2. Unchanged 7 mm mesenteric lymph node. 3. 3 mm left upper lobe nodule, recommend attention on follow-up. Electronically Signed   By: DLovey NewcomerM.D.   On: 04/10/2017 14:49   Ct Abdomen Pelvis W Contrast  Result Date: 04/10/2017 CLINICAL DATA:  History of metastatic lung cancer.  On chemotherapy. EXAM: CT CHEST, ABDOMEN, AND PELVIS WITH CONTRAST TECHNIQUE: Multidetector CT imaging of the chest, abdomen and pelvis was performed following the standard protocol during bolus administration of intravenous contrast. CONTRAST:  1033mISOVUE-300 IOPAMIDOL (ISOVUE-300) INJECTION 61% COMPARISON:  CT CAP 02/10/2017. FINDINGS: CT CHEST FINDINGS Cardiovascular: Left anterior chest wall Port-A-Cath is present with tip terminating in the superior vena cava. Normal heart size. Dense coronary arterial vascular calcifications. Thoracic aortic vascular calcifications. Mediastinum/Nodes: No enlarged axillary, mediastinal or hilar lymphadenopathy. Normal esophagus. Lungs/Pleura: Postsurgical changes compatible with right upper lobectomy. Stable postsurgical changes. Interval increase in size of 3 mm left upper lobe nodule (image 36; series 4), previously  2 mm. No pleural effusion or pneumothorax. Musculoskeletal: Probable bone island left humeral head. Thoracic spine degenerative changes. No aggressive or acute appearing osseous lesions. CT ABDOMEN PELVIS FINDINGS Hepatobiliary: Liver is normal in size and contour. No focal hepatic lesion is identified. Gallbladder is decompressed. No intrahepatic or extrahepatic biliary ductal dilatation. Pancreas: Unremarkable Spleen: Unremarkable Adrenals/Urinary Tract: The adrenal glands are normal. Kidneys enhance symmetrically with contrast. No hydronephrosis. Urinary bladder is unremarkable. Stomach/Bowel: No abnormal bowel wall thickening or evidence for bowel obstruction. Normal morphology of the stomach. No free fluid or free intraperitoneal  air. Vascular/Lymphatic: Normal caliber abdominal aorta. Peripheral calcified atherosclerotic plaque. No retroperitoneal lymphadenopathy. Stable 7 mm small bowel mesenteric lymph node (image 74; series 2). Reproductive: Central dystrophic calcifications in the prostate. Other: None. Musculoskeletal: Lumbar spine degenerative changes. No aggressive or acute appearing osseous lesions. IMPRESSION: 1. No findings of recurrent malignancy in the chest, abdomen or pelvis. 2. Unchanged 7 mm mesenteric lymph node. 3. 3 mm left upper lobe nodule, recommend attention on follow-up. Electronically Signed   By: Lovey Newcomer M.D.   On: 04/10/2017 14:49     ASSESSMENT AND PLAN:  This is a very pleasant 57 years old white male with metastatic non-small cell lung cancer, adenocarcinoma status post right upper lobectomy with lymph node dissection. Unfortunately the patient was found to have metastatic disease in the jejunum and terminal ileum.  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 The patient was started on treatment with systemic chemotherapy with carboplatin and Alimta for 2 cycles discontinued secondary to intolerance and disease progression. The patient is currently undergoing treatment with second line immunotherapy with Ketruda (pembrolizumab) 200 mg IV every 3 weeks, status post 16 cycles. The patient continues to tolerate his treatment fairly well with no concerning complaints.  I recommended for him to proceed with cycle #17 today as a scheduled. He will come back for follow-up visit in 3 weeks for evaluation before starting cycle #18. For diabetes mellitus, he will continue his current treatment by his primary care physician. The patient was advised to call immediately if he has any concerning symptoms in the interval. The patient voices understanding of current disease  status and treatment options and is in agreement with the current care plan. All questions were answered. The patient knows to call the clinic with any problems, questions or concerns. We can certainly see the patient much sooner if necessary.  Disclaimer: This note was dictated with voice recognition software. Similar sounding words can inadvertently be transcribed and may not be corrected upon review.

## 2017-05-08 NOTE — Telephone Encounter (Signed)
Scheduled appt per 4/18 los - pt to get an updated schedule next visit.

## 2017-05-08 NOTE — Progress Notes (Signed)
Ok to treat despite labs

## 2017-05-08 NOTE — Patient Instructions (Signed)
[facility] Discharge Instructions for Patients Receiving Chemotherapy  Today you received the following chemotherapy agents [name]   To help prevent nausea and vomiting after your treatment, we encourage you to take your nausea medication as directed  If you develop nausea and vomiting that is not controlled by your nausea medication, call the clinic.   BELOW ARE SYMPTOMS THAT SHOULD BE REPORTED IMMEDIATELY:  *FEVER GREATER THAN 100.5 F  *CHILLS WITH OR WITHOUT FEVER  NAUSEA AND VOMITING THAT IS NOT CONTROLLED WITH YOUR NAUSEA MEDICATION  *UNUSUAL SHORTNESS OF BREATH  *UNUSUAL BRUISING OR BLEEDING  TENDERNESS IN MOUTH AND THROAT WITH OR WITHOUT PRESENCE OF ULCERS  *URINARY PROBLEMS  *BOWEL PROBLEMS  UNUSUAL RASH Items with * indicate a potential emergency and should be followed up as soon as possible.  Feel free to call the clinic you have any questions or concerns. The clinic phone number is [phone].

## 2017-05-16 ENCOUNTER — Telehealth: Payer: Self-pay | Admitting: Internal Medicine

## 2017-05-16 ENCOUNTER — Ambulatory Visit (INDEPENDENT_AMBULATORY_CARE_PROVIDER_SITE_OTHER): Payer: PPO | Admitting: Internal Medicine

## 2017-05-16 ENCOUNTER — Encounter: Payer: Self-pay | Admitting: Internal Medicine

## 2017-05-16 VITALS — BP 122/82 | HR 89 | Temp 97.8°F | Wt 120.8 lb

## 2017-05-16 DIAGNOSIS — E440 Moderate protein-calorie malnutrition: Secondary | ICD-10-CM

## 2017-05-16 DIAGNOSIS — E785 Hyperlipidemia, unspecified: Secondary | ICD-10-CM | POA: Diagnosis not present

## 2017-05-16 DIAGNOSIS — R0982 Postnasal drip: Secondary | ICD-10-CM

## 2017-05-16 DIAGNOSIS — I6521 Occlusion and stenosis of right carotid artery: Secondary | ICD-10-CM

## 2017-05-16 DIAGNOSIS — Z79899 Other long term (current) drug therapy: Secondary | ICD-10-CM

## 2017-05-16 DIAGNOSIS — T40605S Adverse effect of unspecified narcotics, sequela: Secondary | ICD-10-CM

## 2017-05-16 DIAGNOSIS — G893 Neoplasm related pain (acute) (chronic): Secondary | ICD-10-CM

## 2017-05-16 DIAGNOSIS — E538 Deficiency of other specified B group vitamins: Secondary | ICD-10-CM

## 2017-05-16 DIAGNOSIS — Z681 Body mass index (BMI) 19 or less, adult: Secondary | ICD-10-CM | POA: Diagnosis not present

## 2017-05-16 DIAGNOSIS — T402X5A Adverse effect of other opioids, initial encounter: Secondary | ICD-10-CM

## 2017-05-16 DIAGNOSIS — C3491 Malignant neoplasm of unspecified part of right bronchus or lung: Secondary | ICD-10-CM

## 2017-05-16 DIAGNOSIS — R05 Cough: Secondary | ICD-10-CM | POA: Diagnosis not present

## 2017-05-16 DIAGNOSIS — F09 Unspecified mental disorder due to known physiological condition: Secondary | ICD-10-CM

## 2017-05-16 DIAGNOSIS — E1151 Type 2 diabetes mellitus with diabetic peripheral angiopathy without gangrene: Secondary | ICD-10-CM | POA: Diagnosis not present

## 2017-05-16 DIAGNOSIS — E1140 Type 2 diabetes mellitus with diabetic neuropathy, unspecified: Secondary | ICD-10-CM | POA: Diagnosis not present

## 2017-05-16 DIAGNOSIS — K5903 Drug induced constipation: Secondary | ICD-10-CM | POA: Diagnosis not present

## 2017-05-16 DIAGNOSIS — E7800 Pure hypercholesterolemia, unspecified: Secondary | ICD-10-CM

## 2017-05-16 DIAGNOSIS — E1169 Type 2 diabetes mellitus with other specified complication: Secondary | ICD-10-CM | POA: Diagnosis not present

## 2017-05-16 DIAGNOSIS — E1159 Type 2 diabetes mellitus with other circulatory complications: Secondary | ICD-10-CM | POA: Diagnosis not present

## 2017-05-16 DIAGNOSIS — Z720 Tobacco use: Secondary | ICD-10-CM

## 2017-05-16 DIAGNOSIS — T402X5S Adverse effect of other opioids, sequela: Secondary | ICD-10-CM | POA: Diagnosis not present

## 2017-05-16 DIAGNOSIS — Z7982 Long term (current) use of aspirin: Secondary | ICD-10-CM | POA: Diagnosis not present

## 2017-05-16 DIAGNOSIS — Z79891 Long term (current) use of opiate analgesic: Secondary | ICD-10-CM

## 2017-05-16 DIAGNOSIS — I739 Peripheral vascular disease, unspecified: Secondary | ICD-10-CM

## 2017-05-16 DIAGNOSIS — G629 Polyneuropathy, unspecified: Secondary | ICD-10-CM | POA: Insufficient documentation

## 2017-05-16 DIAGNOSIS — Z7984 Long term (current) use of oral hypoglycemic drugs: Secondary | ICD-10-CM

## 2017-05-16 DIAGNOSIS — G894 Chronic pain syndrome: Secondary | ICD-10-CM

## 2017-05-16 DIAGNOSIS — F17200 Nicotine dependence, unspecified, uncomplicated: Secondary | ICD-10-CM | POA: Diagnosis not present

## 2017-05-16 DIAGNOSIS — E1165 Type 2 diabetes mellitus with hyperglycemia: Secondary | ICD-10-CM | POA: Diagnosis not present

## 2017-05-16 LAB — GLUCOSE, CAPILLARY: Glucose-Capillary: 326 mg/dL — ABNORMAL HIGH (ref 65–99)

## 2017-05-16 LAB — POCT GLYCOSYLATED HEMOGLOBIN (HGB A1C): Hemoglobin A1C: 12.6

## 2017-05-16 MED ORDER — OXYCODONE-ACETAMINOPHEN 10-325 MG PO TABS: 1.0000 | ORAL_TABLET | Freq: Four times a day (QID) | ORAL | 0 refills | Status: DC | PRN

## 2017-05-16 MED ORDER — EMPAGLIFLOZIN 10 MG PO TABS: 10.0000 mg | ORAL_TABLET | Freq: Every day | ORAL | 3 refills | Status: DC

## 2017-05-16 NOTE — Patient Instructions (Signed)
It is always good to see you.  A lot of how you are feeling has to do with your diabetes being out of control.  We also are checking to see if your  Vitamin B12 level is low.  1) Keep taking the medications as you are.  2) We are starting Jardiance 10 mg daily for your diabetes.  3) I checked a B12 level today.  I will call you with the results next week.  4) I wrote for some ultrasound studies of your left leg and right neck (carotid).  5) Let us know if you want to get help quitting smoking.  I will see you back in 3 months, sooner as necessary.

## 2017-05-16 NOTE — Assessment & Plan Note (Signed)
Assessment  He is tolerating the atorvastatin 40 mg by mouth daily without new myalgias.  Plan  We will continue with this high intensity statin and reassess for intolerances at the follow-up visit.

## 2017-05-16 NOTE — Assessment & Plan Note (Signed)
Assessment  Unfortunately he is not mentally ready to quit smoking at this time despite several obvious reasons why this is harmful to his health. I discussed this with the patient and his wife who was sitting at his side. He is fully aware of these reasons.  Plan  He was reminded to call the clinic at any time he decided he was ready to quit smoking and we can assist him with a prescription of a medication to assist in his attempts at cessation. We will reassess his smoking status at the follow-up visit.

## 2017-05-16 NOTE — Telephone Encounter (Signed)
Patient wife said that Dr Eppie Gibson and Ulis Rias told they to call back if there a problem with the medicine, the pain medicine the insurance is company is questioning the qty

## 2017-05-16 NOTE — Assessment & Plan Note (Signed)
Assessment  He continues to follow closely at the cancer center his receiving Keytruda infusions every 3 weeks, which he is tolerating well. Per the notes, the most recent CT scanning demonstrated no evidence of recurrence at this time.  Plan  He will continue to follow closely with the cancer center and receive therapy as appropriate.

## 2017-05-16 NOTE — Assessment & Plan Note (Signed)
Assessment  He has not had any worsening in his baseline exertional claudication. He did stop his Plavix 3 months ago when his prescription ran out. He has continue the aspirin 81 mg by mouth daily.  Plan  We will continue the aspirin at 81 mg by mouth daily. We are also continuing his high intensity statin. We have tried to convince him of the importance of smoking cessation for several reasons, yet he remains in the pre-contemplative stage. We will not restart the Plavix as he has done well over the last 3 months. Instead, we will repeat the ultrasound of the left lower extremity to assess patency of the two left lower extremity stents as well as an ultrasound of the right carotid to assess the patency of that stent.

## 2017-05-16 NOTE — Progress Notes (Signed)
   Subjective:    Patient ID: Brett [name], male    DOB: 1960/08/03, 57 y.o.   MRN: [id number]  HPI  Brett [name] is here for follow-up of his type 2 diabetes complicated by peripheral vascular occlusive disease, chronic pain, metastatic non-small cell lung cancer, moderate protein-calorie malnutrition, and hyperlipidemia. Please see the A&P for the status of the pt's chronic medical problems.  Review of Systems  Constitutional: Positive for fatigue. Negative for activity change, appetite change and unexpected weight change.  HENT: Positive for congestion and postnasal drip.   Eyes: Positive for visual disturbance.       Blurry vision  Respiratory: Positive for cough. Negative for chest tightness, shortness of breath and wheezing.   Cardiovascular: Negative for chest pain and leg swelling.  Gastrointestinal: Positive for constipation. Negative for diarrhea.  Endocrine: Positive for polydipsia and polyuria.  Genitourinary: Positive for frequency and urgency. Negative for decreased urine volume.  Musculoskeletal: Positive for arthralgias and back pain.  Skin: Positive for rash.  Neurological: Positive for numbness.      Objective:   Physical Exam  Constitutional: He is oriented to person, place, and time. He appears well-developed. No distress.  HENT:  Head: Normocephalic.  Eyes: Right eye exhibits no discharge. Left eye exhibits no discharge. No scleral icterus.  Pulmonary/Chest: Effort normal and breath sounds normal. No stridor. No respiratory distress. He has no wheezes. He has no rales.  Musculoskeletal: Normal range of motion. He exhibits no edema or deformity.  Neurological: He is alert and oriented to person, place, and time. A sensory deficit is present. He exhibits normal muscle tone. Coordination normal.  Skin: Skin is warm and dry. He is not diaphoretic.  Psychiatric: He has a normal mood and affect. His behavior is normal. Judgment and thought content normal.  Nursing  note and vitals reviewed.     Assessment & Plan:   Please see problem based charting.

## 2017-05-16 NOTE — Assessment & Plan Note (Signed)
Assessment  He states his opioid therapy related constipation is reasonably well-controlled on the sorbitol 70% when necessary. He is quite happy with this therapy.  Plan  We will continue with the sorbitol 70% as needed, titrated to the desired number of bowel movements per week. We will reassess the efficacy of this therapy at the follow-up visit.

## 2017-05-16 NOTE — Assessment & Plan Note (Signed)
Assessment  His chronic pain syndrome is reasonably well controlled on the Percocet 10-325 mg 1-2 tablets every 6 hours as needed dispense #240 per month. This medication allows him to remain functional around the house as well as to be able to attend his medical tests and office visits.  His last UDS was appropriate in October.  Plan  I have represcribed the Percocet 10-325 mg 1-2 tablets every 6 hours as needed dispense #240 per month 3 months which should get him to the final week of July. We will reassess the efficacy of this therapy at the follow-up visit.

## 2017-05-16 NOTE — Assessment & Plan Note (Signed)
Assessment  His appetite has been decent and he has had no dietary restrictions, despite his diabetes, in order to promote eating. Despite this, his weight has been relatively stable at 120 pounds.  Plan  We are continuing to encourage him to eat when hungry and have made sure that there are no dietary restrictions that would prevent him from eating nourishing food during his active treatment for his lung cancer.

## 2017-05-16 NOTE — Assessment & Plan Note (Signed)
Assessment  He was noted to have decreased vibratory sense in his left lower extremity. Given his malnourished status he may be missing essential nutrients. Review of the record reveals the last vitamin B12 level was 381 four years ago. It is quite possible he has a vitamin B12 deficiency as the cause of his decreased vibratory sense.  Plan  A vitamin B12 and MMA level were drawn during this visit and are pending at the time of this dictation. We will follow-up on the results of these tests and if suggestive of a vitamin B12 deficiency we will provide him with parenteral vitamin B12 to replete his stores.

## 2017-05-16 NOTE — Assessment & Plan Note (Signed)
Assessment  His diabetes is out of control and his hyperglycemia has been worsening. For the last several months he's had polyuria and polydipsia along with blurred vision, all suggestive of and supportive of uncontrolled hyperglycemia. His hemoglobin A1c today was 12.6 up from 8.6 just 3 months ago. He states he's been compliant with his metformin XL 500 mg by mouth twice daily, glipizide 20 mg by mouth twice daily, and Januvia 100 mg by mouth daily.  He admits to not following his diet, which is understandable given that he is being treated for metastatic lung cancer and the goal was actually to encourage him to eat a much is he could. Despite this, his weight has been stable rather than increasing. He is on prednisone prior to his CT scans but that is every 9 weeks. Thus, I do not believe this is contributing to his poorly controlled diabetes. His claudication symptoms are unchanged. He has not developed rest pain or ulcerations. He stopped taking his Plavix 3 months ago as his prescription ran out. He has had no difficulty off of the Plavix and assures me he has been compliant with the aspirin throughout this period.  Plan  We will continue the glipizide at 20 mg by mouth twice daily, metformin XL 500 mg by mouth twice daily, and Januvia 100 mg by mouth daily. We will start empagloflozin 10 mg by mouth daily. If we do not get significant improvement we may give metformin 1000 mg by mouth twice daily another trial. In the past he did not tolerate this dose, but it was not the extended release formulation which he may better tolerate now. Ultimately, he may require initiation of insulin therapy to achieve reasonable control of his diabetes and satisfactorily address the blurry vision, polyuria, and polydipsia. A diabetic foot exam was performed today and there was notable decrease in vibration in the left foot. Further evaluation will take place as outlined elsewhere. We ordered an ultrasound to assess the  patency of the to left lower extremity stents and the right carotid stent. We will continue the aspirin 81 mg by mouth daily.

## 2017-05-19 NOTE — Telephone Encounter (Signed)
PA request for qty override limit submitted online via Cover My Meds.  Request sent for review-decision can take up to 5 days, will call patient back tomorrow with update.Despina Hidden Cassady4/29/20194:13 PM

## 2017-05-19 NOTE — Progress Notes (Signed)
Patient ID: Brett [name], male   DOB: 29-Nov-1960, 57 y.o.   MRN: [id number]  B12 250 which technically is the low end of normal  MMA pending  I remain concerned he is physiologically vitamin B12 deficient.  MMA level will confirm when resulted.  I called Mr. Gill and spoke with Ms. Popelka (he has given me permission to speak with her about health related things).  I told her I remain suspicious but we need to wait for the MMA level which should result in a few days.  She also informed me the insurance company did not approve the percocet #240 and would only approve #180.  Therefore, we are now beginning the process to get the #240 approved, which he has found beneficial given all of his chronic pain issues in the setting of metastatic lung cancer for which he is receiving active treatment.

## 2017-05-20 LAB — METHYLMALONIC ACID, SERUM: Methylmalonic Acid: 477 nmol/L — ABNORMAL HIGH (ref 0–378)

## 2017-05-20 LAB — VITAMIN B12: Vitamin B-12: 250 pg/mL (ref 232–1245)

## 2017-05-23 NOTE — Telephone Encounter (Signed)
Call made to [facility]-I was informed that PA (qty limit override)  had been approved from 05/19/2017 through 01/20/2018.[name], Darlene Cassady5/3/201911:30 AM  Pharmacy aware

## 2017-05-26 ENCOUNTER — Other Ambulatory Visit: Payer: Self-pay | Admitting: Internal Medicine

## 2017-05-26 DIAGNOSIS — E1159 Type 2 diabetes mellitus with other circulatory complications: Secondary | ICD-10-CM

## 2017-05-28 ENCOUNTER — Ambulatory Visit (HOSPITAL_COMMUNITY)
Admission: RE | Admit: 2017-05-28 | Discharge: 2017-05-28 | Disposition: A | Discharge status: Home / Self Care | Payer: PPO | Source: Ambulatory Visit | Attending: Internal Medicine | Admitting: Internal Medicine

## 2017-05-28 DIAGNOSIS — I6521 Occlusion and stenosis of right carotid artery: Secondary | ICD-10-CM | POA: Insufficient documentation

## 2017-05-28 NOTE — Progress Notes (Signed)
VASCULAR LAB PRELIMINARY  PRELIMINARY  PRELIMINARY  PRELIMINARY  Carotid duplex completed.    Preliminary report:  Right ICA stent is patent. 1-39% left ICA plaquing. Vertebral artery flow is antegrade.   Louine Tenpenny, RVT 05/28/2017, 11:41 AM

## 2017-05-29 ENCOUNTER — Encounter: Payer: Self-pay | Admitting: Internal Medicine

## 2017-05-29 ENCOUNTER — Inpatient Hospital Stay (HOSPITAL_BASED_OUTPATIENT_CLINIC_OR_DEPARTMENT_OTHER): Payer: PPO | Admitting: Internal Medicine

## 2017-05-29 ENCOUNTER — Inpatient Hospital Stay: Payer: PPO | Attending: Internal Medicine

## 2017-05-29 ENCOUNTER — Telehealth: Payer: Self-pay

## 2017-05-29 ENCOUNTER — Other Ambulatory Visit: Payer: Self-pay | Admitting: Internal Medicine

## 2017-05-29 ENCOUNTER — Inpatient Hospital Stay: Payer: PPO

## 2017-05-29 VITALS — BP 94/74 | HR 94 | Temp 98.4°F | Resp 18 | Ht 67.0 in | Wt 119.5 lb

## 2017-05-29 DIAGNOSIS — E119 Type 2 diabetes mellitus without complications: Secondary | ICD-10-CM | POA: Insufficient documentation

## 2017-05-29 DIAGNOSIS — Z5112 Encounter for antineoplastic immunotherapy: Secondary | ICD-10-CM

## 2017-05-29 DIAGNOSIS — C3411 Malignant neoplasm of upper lobe, right bronchus or lung: Secondary | ICD-10-CM

## 2017-05-29 DIAGNOSIS — C784 Secondary malignant neoplasm of small intestine: Secondary | ICD-10-CM | POA: Diagnosis not present

## 2017-05-29 DIAGNOSIS — C3491 Malignant neoplasm of unspecified part of right bronchus or lung: Secondary | ICD-10-CM

## 2017-05-29 DIAGNOSIS — E1159 Type 2 diabetes mellitus with other circulatory complications: Secondary | ICD-10-CM

## 2017-05-29 DIAGNOSIS — G894 Chronic pain syndrome: Secondary | ICD-10-CM

## 2017-05-29 DIAGNOSIS — Z79899 Other long term (current) drug therapy: Secondary | ICD-10-CM | POA: Insufficient documentation

## 2017-05-29 DIAGNOSIS — C3490 Malignant neoplasm of unspecified part of unspecified bronchus or lung: Secondary | ICD-10-CM

## 2017-05-29 DIAGNOSIS — R091 Pleurisy: Secondary | ICD-10-CM | POA: Diagnosis not present

## 2017-05-29 DIAGNOSIS — L298 Other pruritus: Secondary | ICD-10-CM | POA: Diagnosis not present

## 2017-05-29 LAB — CMP (CANCER CENTER ONLY)
ALT: 18 U/L (ref 0–55)
AST: 17 U/L (ref 5–34)
Albumin: 4.4 g/dL (ref 3.5–5.0)
Alkaline Phosphatase: 120 U/L (ref 40–150)
Anion gap: 7 (ref 3–11)
BUN: 14 mg/dL (ref 7–26)
CO2: 27 mmol/L (ref 22–29)
Calcium: 10.2 mg/dL (ref 8.4–10.4)
Chloride: 105 mmol/L (ref 98–109)
Creatinine: 1.04 mg/dL (ref 0.70–1.30)
GFR, Est AFR Am: >60 mL/min (ref >60)
GFR, Estimated: >60 mL/min (ref >60)
Glucose, Bld: 164 mg/dL — ABNORMAL HIGH (ref 70–140)
Potassium: 4.9 mmol/L (ref 3.5–5.1)
Sodium: 139 mmol/L (ref 136–145)
Total Bilirubin: 0.4 mg/dL (ref 0.2–1.2)
Total Protein: 8 g/dL (ref 6.4–8.3)

## 2017-05-29 LAB — CBC WITH DIFFERENTIAL (CANCER CENTER ONLY)
Basophils Absolute: 0.1 10*3/uL (ref 0.0–0.1)
Basophils Relative: 1 %
Eosinophils Absolute: 0.2 10*3/uL (ref 0.0–0.5)
Eosinophils Relative: 4 %
HCT: 46.8 % (ref 38.4–49.9)
Hemoglobin: 15.3 g/dL (ref 13.0–17.1)
Lymphocytes Relative: 18 %
Lymphs Abs: 1.2 10*3/uL (ref 0.9–3.3)
MCH: 30.4 pg (ref 27.2–33.4)
MCHC: 32.7 g/dL (ref 32.0–36.0)
MCV: 93 fL (ref 79.3–98.0)
Monocytes Absolute: 0.6 10*3/uL (ref 0.1–0.9)
Monocytes Relative: 8 %
Neutro Abs: 4.9 10*3/uL (ref 1.5–6.5)
Neutrophils Relative %: 69 %
Platelet Count: 219 10*3/uL (ref 140–400)
RBC: 5.03 MIL/uL (ref 4.20–5.82)
RDW: 14.5 % (ref 11.0–14.6)
WBC Count: 6.9 10*3/uL (ref 4.0–10.3)

## 2017-05-29 LAB — TSH: TSH: 2.583 u[IU]/mL (ref 0.320–4.118)

## 2017-05-29 MED ORDER — HEPARIN SOD (PORK) LOCK FLUSH 100 UNIT/ML IV SOLN
500.0000 [IU] | Freq: Once | INTRAVENOUS | Status: AC | PRN
  Administered 2017-05-29: 500 [IU]
  Filled 2017-05-29: qty 5

## 2017-05-29 MED ORDER — OXYCODONE-ACETAMINOPHEN 10-325 MG PO TABS: 1.0000 | ORAL_TABLET | Freq: Four times a day (QID) | ORAL | 0 refills | Status: DC | PRN

## 2017-05-29 MED ORDER — CYANOCOBALAMIN 1000 MCG/ML IJ SOLN
1000.0000 ug | INTRAMUSCULAR | Status: DC
  Administered 2017-06-18: 1000 ug via INTRAMUSCULAR

## 2017-05-29 MED ORDER — SODIUM CHLORIDE 0.9 % IV SOLN
Freq: Once | INTRAVENOUS | Status: AC
  Administered 2017-05-29: 11:00:00 via INTRAVENOUS

## 2017-05-29 MED ORDER — SODIUM CHLORIDE 0.9 % IV SOLN
200.0000 mg | Freq: Once | INTRAVENOUS | Status: AC
  Administered 2017-05-29: 200 mg via INTRAVENOUS
  Filled 2017-05-29: qty 8

## 2017-05-29 MED ORDER — LISINOPRIL 5 MG PO TABS: 2.5000 mg | ORAL_TABLET | Freq: Every day | ORAL | 3 refills | Status: DC

## 2017-05-29 MED ORDER — SODIUM CHLORIDE 0.9% FLUSH
10.0000 mL | INTRAVENOUS | Status: DC | PRN
  Administered 2017-05-29: 10 mL
  Filled 2017-05-29: qty 10

## 2017-05-29 MED ORDER — HYDROXYZINE HCL 10 MG PO TABS: 10.0000 mg | ORAL_TABLET | Freq: Three times a day (TID) | ORAL | 0 refills | Status: DC | PRN

## 2017-05-29 NOTE — Telephone Encounter (Signed)
Patient was already scheduled. Per 5/9 los. Printed avs and calender also gave contrast, instructions, and ct number.

## 2017-05-29 NOTE — Patient Instructions (Signed)
Steps to Quit Smoking Smoking tobacco can be bad for your health. It can also affect almost every organ in your body. Smoking puts you and people around you at risk for many serious long-lasting (chronic) diseases. Quitting smoking is hard, but it is one of the best things that you can do for your health. It is never too late to quit. What are the benefits of quitting smoking? When you quit smoking, you lower your risk for getting serious diseases and conditions. They can include:  Lung cancer or lung disease.  Heart disease.  Stroke.  Heart attack.  Not being able to have children (infertility).  Weak bones (osteoporosis) and broken bones (fractures).  If you have coughing, wheezing, and shortness of breath, those symptoms may get better when you quit. You may also get sick less often. If you are pregnant, quitting smoking can help to lower your chances of having a baby of low birth weight. What can I do to help me quit smoking? Talk with your doctor about what can help you quit smoking. Some things you can do (strategies) include:  Quitting smoking totally, instead of slowly cutting back how much you smoke over a period of time.  Going to in-person counseling. You are more likely to quit if you go to many counseling sessions.  Using resources and support systems, such as: ? Online chats with a counselor. ? Phone quitlines. ? Printed self-help materials. ? Support groups or group counseling. ? Text messaging programs. ? Mobile phone apps or applications.  Taking medicines. Some of these medicines may have nicotine in them. If you are pregnant or breastfeeding, do not take any medicines to quit smoking unless your doctor says it is okay. Talk with your doctor about counseling or other things that can help you.  Talk with your doctor about using more than one strategy at the same time, such as taking medicines while you are also going to in-person counseling. This can help make  quitting easier. What things can I do to make it easier to quit? Quitting smoking might feel very hard at first, but there is a lot that you can do to make it easier. Take these steps:  Talk to your family and friends. Ask them to support and encourage you.  Call phone quitlines, reach out to support groups, or work with a counselor.  Ask people who smoke to not smoke around you.  Avoid places that make you want (trigger) to smoke, such as: ? Bars. ? Parties. ? Smoke-break areas at work.  Spend time with people who do not smoke.  Lower the stress in your life. Stress can make you want to smoke. Try these things to help your stress: ? Getting regular exercise. ? Deep-breathing exercises. ? Yoga. ? Meditating. ? Doing a body scan. To do this, close your eyes, focus on one area of your body at a time from head to toe, and notice which parts of your body are tense. Try to relax the muscles in those areas.  Download or buy apps on your mobile phone or tablet that can help you stick to your quit plan. There are many free apps, such as QuitGuide from the CDC (Centers for Disease Control and Prevention). You can find more support from smokefree.gov and other websites.  This information is not intended to replace advice given to you by your health care provider. Make sure you discuss any questions you have with your health care provider. Document Released: 11/03/2008 Document   Revised: 09/05/2015 Document Reviewed: 05/24/2014 Elsevier Interactive Patient Education  2018 Elsevier Inc.  

## 2017-05-29 NOTE — Patient Instructions (Signed)
[facility] Discharge Instructions for Patients Receiving Chemotherapy  Today you received the following chemotherapy agents:  Keytruda (pembrolizumab)  To help prevent nausea and vomiting after your treatment, we encourage you to take your nausea medication as prescribed.   If you develop nausea and vomiting that is not controlled by your nausea medication, call the clinic.   BELOW ARE SYMPTOMS THAT SHOULD BE REPORTED IMMEDIATELY:  *FEVER GREATER THAN 100.5 F  *CHILLS WITH OR WITHOUT FEVER  NAUSEA AND VOMITING THAT IS NOT CONTROLLED WITH YOUR NAUSEA MEDICATION  *UNUSUAL SHORTNESS OF BREATH  *UNUSUAL BRUISING OR BLEEDING  TENDERNESS IN MOUTH AND THROAT WITH OR WITHOUT PRESENCE OF ULCERS  *URINARY PROBLEMS  *BOWEL PROBLEMS  UNUSUAL RASH Items with * indicate a potential emergency and should be followed up as soon as possible.  Feel free to call the clinic should you have any questions or concerns. The clinic phone number is [phone].  Please show the [facility] at check-in to the Emergency Department and triage nurse.

## 2017-05-29 NOTE — Addendum Note (Signed)
Addended by: [name] D on: 05/29/2017 02:20 PM   Modules accepted: Orders

## 2017-05-29 NOTE — Progress Notes (Signed)
Patient ID: Brett [name], male   DOB: 08-16-1960, 57 y.o.   MRN: [id number]  MMA 477 which is elevated.  Although this may be a reaction with metformin, I believe the benefits of supplementation outweigh the risks given his symptoms.  We will therefore provide vitamin B12 1000 mcg IM Q month.  I tried calling cell but received no answer or opportunity for voice mail.  Will try contacting later.

## 2017-05-29 NOTE — Progress Notes (Signed)
[facility] [phone]   [phone]  OFFICE PROGRESS NOTE  [name], MD [address] [place] [address]  DIAGNOSIS: Stage IV (T1b, N0, M1b) non-small cell lung cancer, adenocarcinoma presented with right upper lobe lung nodule and recent metastasis to the small intestine. This was initially diagnosed in September 2017.  Genomic Alterations Identified? ERBB2 amplification - equivocal? CDKN2A p16INK4a E88* and p14ARF W119J SMARCA4 splice site [id number] SPTA1 E2022* TOP2A amplification TP53 A159P Additional Findings? Microsatellite status MS-Stable Tumor Mutation Burden TMB-Intermediate; 18 Muts/Mb Additional Disease-relevant Genes with No Reportable Alterations Identified? EGFR KRAS ALK BRAF MET RET ROS1   PRIOR THERAPY:  1) Status post right VATS with right upper lobectomy and mediastinal lymph node dissection under the care of Dr. Roxan Hockey on 10/18/2015 and the final pathology was consistent with stage IA (T1b, N0, MX). 2) upper endoscopy on 01/05/2016 showed normal esophagus, normal stomach but there was occasional mass around 3.0 CM in length circumferential nonobstructing in the jejunum. The final pathology was consistent with metastatic adenocarcinoma. 3) status post laparoscopic laparotomy and resection of proximal lesion and and distal jejunum/proximal ileum under the care of Dr. Hassell Done 1 02/27/2016. 3)  Systemic chemotherapy with carboplatin for AUC of 5 and Alimta 500 MG/M2 every 3 weeks. First dose 04/04/2016. Status post 2 cycles. Last dose was given 04/21/2016 discontinued secondary to disease progression.   CURRENT THERAPY: Second line immunotherapy with Ketruda 200 mg IV every 2 weeks, first dose 05/30/2016. Status post 17 cycles.  INTERVAL HISTORY: Brett [name] 57 y.o. male returns to the clinic today for follow-up visit accompanied by his wife.  The patient is feeling fine today with no specific  complaints.  He lost 1 pound since his last visit.  He eats good.  He denied having any chest pain, shortness of breath, cough or hemoptysis.  He denied having any fever or chills.  He has no nausea, vomiting, diarrhea or constipation.  He continues to tolerate his treatment with Keytruda fairly well.  He is here today for evaluation before starting cycle #18 of his treatment.  MEDICAL HISTORY: Past Medical History:  Diagnosis Date  . Anemia 11/28/2015  . Anxiety   . Arthritis    "hands, back" (11/28/2015)  . Barrett's esophagus 07/01/2013   Without dysplasia on biopsy 09/03/2012. Repeat EGD recommended 08/2015  . Bilateral cataracts 02/13/2017  . Carotid artery stenosis 07/01/2013   Requiring right sided stent   . Chronic pain syndrome 07/01/2013  . Closed head injury with brief loss of consciousness ([facility]) 07/22/2010   Head trapped in a hydraulic device at work.  Fracture of orbital bones on right and brief loss of consciousness per report.  . 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 There was likely a strong psycho-social overlay in regard to the cognitive deficits in the form of mood disorder with psychotic features and mixed anxiety symptomatology. His primary tested cognitive deficits are in the areas of attention, executi  . Daily headache "since 07/2010"   constantly  . Degenerative joint disease of cervical spine 07/01/2013  . Dehydration 04/11/2016  . Dupuytren's contracture of both hands 04/08/2014  . Encounter for antineoplastic chemotherapy 10/05/2015  . Encounter for antineoplastic immunotherapy 05/24/2016  . Erectile dysfunction associated with type 2 diabetes mellitus ([facility]) 07/01/2013  . Fibromyalgia 07/01/2013  . Goals of care, counseling/discussion 03/28/2016  .  History of blood transfusion 11/28/2015   "suppose to get his first today" (11/28/2015)    . Hyperlipidemia LDL goal < 100 07/01/2013  . Intractable hiccups 04/11/2016  . 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 Memory changes    "memory issues" from head injury  . Moderate protein-calorie malnutrition ([facility]) 11/29/2015  . Osteoarthritis of right thumb 10/21/2014  . Peripheral vascular occlusive disease ([facility]) 07/01/2013   Requiring 2 arterial stents above the left knee per report  . Pneumonia ~ 2006/2007  . Post traumatic stress disorder 07/01/2013  . Primary lung adenocarcinoma ([facility]) dx'd 08/2015   "right lung"  . Severe major depression with psychotic features ([facility]) 04/15/2011  . Tobacco abuse 07/01/2013  . Tobacco abuse   . Type 2 diabetes mellitus with vascular disease ([place] [facility]) 07/01/2013   Left lower extremity and right carotid stenting    ALLERGIES:  is allergic to gabapentin; lyrica [pregabalin]; celebrex [celecoxib]; and contrast media [iodinated diagnostic agents].  MEDICATIONS:  Current Outpatient Medications  Medication Sig Dispense Refill  . aspirin EC 81 MG tablet Take 81 mg by mouth daily.     Marland Kitchen atorvastatin (LIPITOR) 40 MG tablet Take 1 tablet (40 mg total) by mouth daily. 90 tablet 3  . cyclobenzaprine (FLEXERIL) 10 MG tablet Take 1 tablet (10 mg total) by mouth 3 (three) times daily as needed for muscle spasms. 90 tablet 11  . dronabinol (MARINOL) 2.5 MG capsule Take 1 capsule (2.5 mg total) by mouth 2 (two) times daily before a meal. 60 capsule 0  . empagliflozin (JARDIANCE) 10 MG TABS tablet Take 10 mg by mouth daily. 90 tablet 3  . erythromycin ophthalmic ointment Place 1 application into both eyes 2 (two) times daily.  1  . glipiZIDE (GLUCOTROL) 10 MG tablet Take 2 tablets (20 mg total) by mouth 2 (two) times daily before a meal. 360 tablet 3  . lidocaine-prilocaine (EMLA) cream Apply 1 application topically as needed. 30 g 0  . lisinopril (PRINIVIL,ZESTRIL) 5 MG tablet Take 0.5 tablets (2.5 mg total) by mouth daily. 90 tablet 3  . metFORMIN (GLUCOPHAGE-XR) 500  MG 24 hr tablet Take 1 tablet (500 mg total) by mouth 2 (two) times daily. 180 tablet 3  . metoCLOPramide (REGLAN) 10 MG tablet TAKE 1 TABLET BY MOUTH FOUR TIMES DAILY AS NEEDED AS DIRECTED 30 tablet 0  . oxyCODONE-acetaminophen (PERCOCET) 10-325 MG tablet Take 1-2 tablets by mouth every 6 (six) hours as needed for pain. 240 tablet 0  . pantoprazole (PROTONIX) 40 MG tablet Take 1 tablet (40 mg total) by mouth daily. 90 tablet 3  . PROLENSA 0.07 % SOLN Place 1 drop into both eyes 2 (two) times daily.  0  . senna-docusate (SENOKOT-S) 8.6-50 MG tablet Take 1-2 tablets by mouth 2 (two) times daily as needed for mild constipation or moderate constipation. 30 tablet 1  . sitaGLIPtin (JANUVIA) 100 MG tablet Take 1 tablet (100 mg total) by mouth daily. 90 tablet 3  . diphenhydrAMINE (BENADRYL) 25 mg capsule Take 2 capsules (50 mg total) by mouth as directed. Take prior to CT scan as directed (Patient not taking: Reported on 05/29/2017) 30 capsule 0  . ondansetron (ZOFRAN) 4 MG tablet Take 1 tablet (4 mg total) by mouth 3 (three) times daily. (Patient not taking: Reported on 05/29/2017) 20 tablet 0  . predniSONE (DELTASONE) 50 MG tablet Take 1 tablet (50 mg total) by mouth as directed. Take 50 mg 13 hours. 7 hours and 1 hour prior to scan (Patient not  taking: Reported on 05/29/2017) 3 tablet 2  . prochlorperazine (COMPAZINE) 10 MG tablet Take 10 mg by mouth every 6 (six) hours as needed for nausea or vomiting.    . prochlorperazine (COMPAZINE) 25 MG suppository Place 1 suppository (25 mg total) rectally every 12 (twelve) hours as needed for refractory nausea / vomiting. (Patient not taking: Reported on 05/29/2017) 12 suppository 11  . sorbitol 70 % solution Take 15 cc every 4 hours until bowel movement (Patient not taking: Reported on 05/29/2017) 473 mL 0   No current facility-administered medications for this visit.     SURGICAL HISTORY:  Past Surgical History:  Procedure Laterality Date  . CAROTID STENT Right  ?2014  . COLONOSCOPY N/A 11/30/2015   Procedure: COLONOSCOPY;  Surgeon: Teena Irani, MD;  Location: [facility] ENDOSCOPY;  Service: Endoscopy;  Laterality: N/A;  . ESOPHAGOGASTRODUODENOSCOPY N/A 11/30/2015   Procedure: ESOPHAGOGASTRODUODENOSCOPY (EGD);  Surgeon: Teena Irani, MD;  Location: [facility] ENDOSCOPY;  Service: Endoscopy;  Laterality: N/A;  . ESOPHAGOGASTRODUODENOSCOPY (EGD) WITH PROPOFOL N/A 01/05/2016   Procedure: ESOPHAGOGASTRODUODENOSCOPY (EGD) WITH PROPOFOL;  Surgeon: Teena Irani, MD;  Location: [facility] ENDOSCOPY;  Service: Endoscopy;  Laterality: N/A;  . FEMORAL ARTERY STENT Left 05/2012; ~ 2015   Archie Endo 06/04/2012; Raechel Chute report  . FRACTURE SURGERY    . GIVENS CAPSULE STUDY N/A 12/22/2015   Procedure: GIVENS CAPSULE STUDY;  Surgeon: Wonda Horner, MD;  Location: [facility] ENDOSCOPY;  Service: Endoscopy;  Laterality: N/A;  . HARDWARE REMOVAL Right 11/15/2011   Removal of deep frontozygomatic orbital hardware/notes 11/15/2011  . HERNIA REPAIR  6720   Umbilical  . LAPAROSCOPY N/A 02/27/2016   Procedure: LAPAROSCOPY, LAPAROTOMY  WITH TWO SMALL BOWEL RESECTION;  Surgeon: Johnathan Hausen, MD;  Location: [facility] ORS;  Service: General;  Laterality: N/A;  . ORIF ORBITAL FRACTURE Right 08/15/2010    caught in a hydraulic machine; open reduction internal fixation of orbital rim fracture and open reduction of zygomatic arch fracture  Archie Endo 10/13/2009  . PORTACATH PLACEMENT Left 04/04/2016   Procedure: INSERTION PORT-A-CATH LEFT CHEST;  Surgeon: Melrose Nakayama, MD;  Location: [facility];  Service: Thoracic;  Laterality: Left;  Marland Kitchen VIDEO ASSISTED THORACOSCOPY (VATS)/ LOBECTOMY Right 10/18/2015   Procedure: VIDEO ASSISTED THORACOSCOPY (VATS)/ LOBECTOMY;  Surgeon: Melrose Nakayama, MD;  Location: [facility];  Service: Thoracic;  Laterality: Right;  Marland Kitchen VIDEO BRONCHOSCOPY Bilateral 09/21/2015   Procedure: VIDEO BRONCHOSCOPY WITH FLUORO;  Surgeon: Juanito Doom, MD;  Location: [facility] ENDOSCOPY;  Service: Cardiopulmonary;  Laterality:  Bilateral;    REVIEW OF SYSTEMS:  A comprehensive review of systems was negative except for: Integument/breast: positive for pruritus   PHYSICAL EXAMINATION: General appearance: alert, cooperative and no distress Head: Normocephalic, without obvious abnormality, atraumatic Neck: no adenopathy, no JVD, supple, symmetrical, trachea midline and thyroid not enlarged, symmetric, no tenderness/mass/nodules Lymph nodes: Cervical, supraclavicular, and axillary nodes normal. Resp: clear to auscultation bilaterally Back: symmetric, no curvature. ROM normal. No CVA tenderness. Cardio: regular rate and rhythm, S1, S2 normal, no murmur, click, rub or gallop GI: soft, non-tender; bowel sounds normal; no masses,  no organomegaly Extremities: extremities normal, atraumatic, no cyanosis or edema  ECOG PERFORMANCE STATUS: 1 - Symptomatic but completely ambulatory  Blood pressure 94/74, pulse 94, temperature 98.4 F (36.9 C), temperature source Oral, resp. rate 18, height 5' 7"  (1.702 m), weight 119 lb 8 oz (54.2 kg), SpO2 100 %.  LABORATORY DATA: Lab Results  Component Value Date   WBC 6.9 05/29/2017   HGB 15.3 05/29/2017   HCT  46.8 05/29/2017   MCV 93.0 05/29/2017   PLT 219 05/29/2017      Chemistry      Component Value Date/Time   NA 139 05/29/2017 0908   NA 140 01/23/2017 0910   K 4.9 05/29/2017 0908   K 4.5 01/23/2017 0910   CL 105 05/29/2017 0908   CO2 27 05/29/2017 0908   CO2 28 01/23/2017 0910   BUN 14 05/29/2017 0908   BUN 8.1 01/23/2017 0910   CREATININE 1.04 05/29/2017 0908   CREATININE 0.8 01/23/2017 0910   GLU 398 (H) 08/08/2016 1257      Component Value Date/Time   CALCIUM 10.2 05/29/2017 0908   CALCIUM 9.6 01/23/2017 0910   ALKPHOS 120 05/29/2017 0908   ALKPHOS 113 01/23/2017 0910   AST 17 05/29/2017 0908   AST 11 01/23/2017 0910   ALT 18 05/29/2017 0908   ALT 12 01/23/2017 0910   BILITOT 0.4 05/29/2017 0908   BILITOT 0.40 01/23/2017 0910       RADIOGRAPHIC  STUDIES: No results found.   ASSESSMENT AND PLAN:  This is a very pleasant 57 years old white male with metastatic non-small cell lung cancer, adenocarcinoma status post right upper lobectomy with lymph node dissection. Unfortunately the patient was found to have metastatic disease in the jejunum and terminal ileum.  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 The patient was started on treatment with systemic chemotherapy with carboplatin and Alimta for 2 cycles discontinued secondary to intolerance and disease progression. The patient is currently undergoing treatment with second line immunotherapy with Ketruda (pembrolizumab) 200 mg IV every 3 weeks, status post 17 cycles. He continues to tolerate this treatment well with no concerning complaints.  I recommended for him to proceed with cycle #18 today as a scheduled. I will see him back for follow-up visit in 3 weeks for evaluation after repeating CT scan of the chest, abdomen and pelvis for restaging of his disease. For the pleuritis, I will start the patient on Atarax 1-2 times daily as needed For diabetes mellitus, he will continue his current treatment by his primary care physician. The patient was advised to call immediately if he has any concerning symptoms in the interval. The patient voices understanding of current disease status and treatment options and is in agreement with the current care plan. All questions were answered. The patient knows to call the clinic with any problems, questions or concerns. We can certainly see the patient much sooner if necessary.  Disclaimer: This note was dictated with voice recognition software. Similar sounding words can inadvertently be transcribed and may not be corrected upon review.

## 2017-06-18 ENCOUNTER — Ambulatory Visit (HOSPITAL_COMMUNITY)
Admission: RE | Admit: 2017-06-18 | Discharge: 2017-06-18 | Disposition: A | Discharge status: Home / Self Care | Payer: PPO | Source: Ambulatory Visit | Attending: Internal Medicine | Admitting: Internal Medicine

## 2017-06-18 ENCOUNTER — Ambulatory Visit (INDEPENDENT_AMBULATORY_CARE_PROVIDER_SITE_OTHER): Payer: PPO | Admitting: *Deleted

## 2017-06-18 DIAGNOSIS — N400 Enlarged prostate without lower urinary tract symptoms: Secondary | ICD-10-CM | POA: Insufficient documentation

## 2017-06-18 DIAGNOSIS — G629 Polyneuropathy, unspecified: Secondary | ICD-10-CM

## 2017-06-18 DIAGNOSIS — C171 Malignant neoplasm of jejunum: Secondary | ICD-10-CM

## 2017-06-18 DIAGNOSIS — R911 Solitary pulmonary nodule: Secondary | ICD-10-CM | POA: Insufficient documentation

## 2017-06-18 DIAGNOSIS — C3490 Malignant neoplasm of unspecified part of unspecified bronchus or lung: Secondary | ICD-10-CM

## 2017-06-18 DIAGNOSIS — E538 Deficiency of other specified B group vitamins: Secondary | ICD-10-CM

## 2017-06-18 DIAGNOSIS — C7880 Secondary malignant neoplasm of unspecified digestive organ: Secondary | ICD-10-CM | POA: Diagnosis not present

## 2017-06-18 DIAGNOSIS — M5136 Other intervertebral disc degeneration, lumbar region: Secondary | ICD-10-CM | POA: Diagnosis not present

## 2017-06-18 DIAGNOSIS — I2510 Atherosclerotic heart disease of native coronary artery without angina pectoris: Secondary | ICD-10-CM | POA: Insufficient documentation

## 2017-06-18 DIAGNOSIS — I700 Atherosclerosis of aorta: Secondary | ICD-10-CM | POA: Diagnosis not present

## 2017-06-18 MED ORDER — IOPAMIDOL (ISOVUE-300) INJECTION 61%
100.0000 mL | Freq: Once | INTRAVENOUS | Status: AC | PRN
  Administered 2017-06-18: 100 mL via INTRAVENOUS

## 2017-06-18 MED ORDER — IOPAMIDOL (ISOVUE-300) INJECTION 61%
INTRAVENOUS | Status: AC
  Filled 2017-06-18: qty 100

## 2017-06-19 ENCOUNTER — Inpatient Hospital Stay: Payer: PPO

## 2017-06-19 ENCOUNTER — Inpatient Hospital Stay (HOSPITAL_BASED_OUTPATIENT_CLINIC_OR_DEPARTMENT_OTHER): Payer: PPO | Admitting: Internal Medicine

## 2017-06-19 ENCOUNTER — Encounter: Payer: Self-pay | Admitting: Internal Medicine

## 2017-06-19 ENCOUNTER — Telehealth: Payer: Self-pay | Admitting: Internal Medicine

## 2017-06-19 VITALS — BP 106/64 | HR 69 | Temp 98.1°F | Resp 17 | Ht 67.0 in | Wt 118.7 lb

## 2017-06-19 DIAGNOSIS — C3491 Malignant neoplasm of unspecified part of right bronchus or lung: Secondary | ICD-10-CM

## 2017-06-19 DIAGNOSIS — C171 Malignant neoplasm of jejunum: Secondary | ICD-10-CM

## 2017-06-19 DIAGNOSIS — Z5112 Encounter for antineoplastic immunotherapy: Secondary | ICD-10-CM

## 2017-06-19 DIAGNOSIS — C3411 Malignant neoplasm of upper lobe, right bronchus or lung: Secondary | ICD-10-CM | POA: Diagnosis not present

## 2017-06-19 DIAGNOSIS — E440 Moderate protein-calorie malnutrition: Secondary | ICD-10-CM

## 2017-06-19 DIAGNOSIS — E119 Type 2 diabetes mellitus without complications: Secondary | ICD-10-CM | POA: Diagnosis not present

## 2017-06-19 DIAGNOSIS — L298 Other pruritus: Secondary | ICD-10-CM

## 2017-06-19 LAB — CMP (CANCER CENTER ONLY)
ALT: 12 U/L (ref 0–55)
AST: 11 U/L (ref 5–34)
Albumin: 4.2 g/dL (ref 3.5–5.0)
Alkaline Phosphatase: 106 U/L (ref 40–150)
Anion gap: 11 (ref 3–11)
BUN: 17 mg/dL (ref 7–26)
CO2: 26 mmol/L (ref 22–29)
Calcium: 10.1 mg/dL (ref 8.4–10.4)
Chloride: 103 mmol/L (ref 98–109)
Creatinine: 0.88 mg/dL (ref 0.70–1.30)
GFR, Est AFR Am: >60 mL/min (ref >60)
GFR, Estimated: >60 mL/min (ref >60)
Glucose, Bld: 202 mg/dL — ABNORMAL HIGH (ref 70–140)
Potassium: 3.8 mmol/L (ref 3.5–5.1)
Sodium: 140 mmol/L (ref 136–145)
Total Bilirubin: 0.3 mg/dL (ref 0.2–1.2)
Total Protein: 7.6 g/dL (ref 6.4–8.3)

## 2017-06-19 LAB — TSH: TSH: 5.882 u[IU]/mL — ABNORMAL HIGH (ref 0.320–4.118)

## 2017-06-19 LAB — CBC WITH DIFFERENTIAL (CANCER CENTER ONLY)
Basophils Absolute: 0.1 10*3/uL (ref 0.0–0.1)
Basophils Relative: 1 %
Eosinophils Absolute: 0.2 10*3/uL (ref 0.0–0.5)
Eosinophils Relative: 2 %
HCT: 42 % (ref 38.4–49.9)
Hemoglobin: 14.2 g/dL (ref 13.0–17.1)
Lymphocytes Relative: 19 %
Lymphs Abs: 1.7 10*3/uL (ref 0.9–3.3)
MCH: 30.5 pg (ref 27.2–33.4)
MCHC: 33.8 g/dL (ref 32.0–36.0)
MCV: 90.1 fL (ref 79.3–98.0)
Monocytes Absolute: 0.7 10*3/uL (ref 0.1–0.9)
Monocytes Relative: 8 %
Neutro Abs: 6.2 10*3/uL (ref 1.5–6.5)
Neutrophils Relative %: 70 %
Platelet Count: 225 10*3/uL (ref 140–400)
RBC: 4.66 MIL/uL (ref 4.20–5.82)
RDW: 14.7 % — ABNORMAL HIGH (ref 11.0–14.6)
WBC Count: 8.9 10*3/uL (ref 4.0–10.3)

## 2017-06-19 MED ORDER — SODIUM CHLORIDE 0.9 % IV SOLN
Freq: Once | INTRAVENOUS | Status: AC
  Administered 2017-06-19: 11:00:00 via INTRAVENOUS

## 2017-06-19 MED ORDER — SODIUM CHLORIDE 0.9 % IV SOLN
200.0000 mg | Freq: Once | INTRAVENOUS | Status: AC
  Administered 2017-06-19: 200 mg via INTRAVENOUS
  Filled 2017-06-19: qty 8

## 2017-06-19 MED ORDER — HEPARIN SOD (PORK) LOCK FLUSH 100 UNIT/ML IV SOLN
500.0000 [IU] | Freq: Once | INTRAVENOUS | Status: DC | PRN
  Filled 2017-06-19: qty 5

## 2017-06-19 MED ORDER — SODIUM CHLORIDE 0.9% FLUSH
10.0000 mL | INTRAVENOUS | Status: DC | PRN
  Filled 2017-06-19: qty 10

## 2017-06-19 NOTE — Progress Notes (Signed)
[place] Telephone:(336) [phone]   Fax:([phone]) [phone]  OFFICE PROGRESS NOTE  [name], MD [address] [place] [address]  DIAGNOSIS: Stage IV (T1b, N0, M1b) non-small cell lung cancer, adenocarcinoma presented with right upper lobe lung nodule and recent metastasis to the small intestine. This was initially diagnosed in September 2017.  Genomic Alterations Identified? ERBB2 amplification - equivocal? CDKN2A p16INK4a E88* and p14ARF E527P SMARCA4 splice site [id number] SPTA1 E2022* TOP2A amplification TP53 A159P Additional Findings? Microsatellite status MS-Stable Tumor Mutation Burden TMB-Intermediate; 18 Muts/Mb Additional Disease-relevant Genes with No Reportable Alterations Identified? EGFR KRAS ALK BRAF MET RET ROS1   PRIOR THERAPY:  1) Status post right VATS with right upper lobectomy and mediastinal lymph node dissection under the care of Dr. Roxan Hockey on 10/18/2015 and the final pathology was consistent with stage IA (T1b, N0, MX). 2) upper endoscopy on 01/05/2016 showed normal esophagus, normal stomach but there was occasional mass around 3.0 CM in length circumferential nonobstructing in the jejunum. The final pathology was consistent with metastatic adenocarcinoma. 3) status post laparoscopic laparotomy and resection of proximal lesion and and distal jejunum/proximal ileum under the care of Dr. Hassell Done 1 02/27/2016. 3)  Systemic chemotherapy with carboplatin for AUC of 5 and Alimta 500 MG/M2 every 3 weeks. First dose 04/04/2016. Status post 2 cycles. Last dose was given 04/21/2016 discontinued secondary to disease progression.   CURRENT THERAPY: Second line immunotherapy with Ketruda 200 mg IV every 2 weeks, first dose 05/30/2016. Status post 18 cycles.  INTERVAL HISTORY: Brett [name] 57 y.o. male returns to the clinic today for follow-up visit accompanied by his wife.  The patient is feeling fine today with no specific  complaints.  He denied having any chest pain, shortness of breath, cough or hemoptysis.  He denied having any fever or chills.  He has no nausea, vomiting, diarrhea or constipation.  He has no weight loss or night sweats.  He has been tolerating his treatment with Keytruda fairly well.  The patient had a repeat CT scan of the chest, abdomen and pelvis performed recently and he is here for evaluation and discussion of his discuss results.  MEDICAL HISTORY: Past Medical History:  Diagnosis Date  . Anemia 11/28/2015  . Anxiety   . Arthritis    "hands, back" (11/28/2015)  . Barrett's esophagus 07/01/2013   Without dysplasia on biopsy 09/03/2012. Repeat EGD recommended 08/2015  . Bilateral cataracts 02/13/2017  . Carotid artery stenosis 07/01/2013   Requiring right sided stent   . Chronic pain syndrome 07/01/2013  . Closed head injury with brief loss of consciousness ([place]) 07/22/2010   Head trapped in a hydraulic device at work.  Fracture of orbital bones on right and brief loss of consciousness per report.  . 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 There was likely a strong psycho-social overlay in regard to the cognitive deficits in the form of mood disorder with psychotic features and mixed anxiety symptomatology. His primary tested cognitive deficits are in the areas of attention, executi  . Daily headache "since 07/2010"   constantly  . Degenerative joint disease of cervical spine 07/01/2013  . Dehydration 04/11/2016  . Dupuytren's contracture of both hands 04/08/2014  . Encounter for antineoplastic chemotherapy 10/05/2015  . Encounter for antineoplastic immunotherapy 05/24/2016  . Erectile dysfunction associated with type 2 diabetes mellitus ([place]) 07/01/2013  .  Fibromyalgia 07/01/2013  . Goals of care, counseling/discussion 03/28/2016  . History of blood  transfusion 11/28/2015   "suppose to get his first today" (11/28/2015)  . Hyperlipidemia LDL goal < 100 07/01/2013  . Intractable hiccups 04/11/2016  . 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 Memory changes    "memory issues" from head injury  . Moderate protein-calorie malnutrition ([place]) 11/29/2015  . Osteoarthritis of right thumb 10/21/2014  . Peripheral vascular occlusive disease ([place]) 07/01/2013   Requiring 2 arterial stents above the left knee per report  . Pneumonia ~ 2006/2007  . Post traumatic stress disorder 07/01/2013  . Primary lung adenocarcinoma ([place]) dx'd 08/2015   "right lung"  . Severe major depression with psychotic features ([place]) 04/15/2011  . Tobacco abuse 07/01/2013  . Tobacco abuse   . Type 2 diabetes mellitus with vascular disease ([place]) 07/01/2013   Left lower extremity and right carotid stenting    ALLERGIES:  is allergic to gabapentin; lyrica [pregabalin]; celebrex [celecoxib]; and contrast media [iodinated diagnostic agents].  MEDICATIONS:  Current Outpatient Medications  Medication Sig Dispense Refill  . aspirin EC 81 MG tablet Take 81 mg by mouth daily.     Marland Kitchen atorvastatin (LIPITOR) 40 MG tablet Take 1 tablet (40 mg total) by mouth daily. 90 tablet 3  . cyclobenzaprine (FLEXERIL) 10 MG tablet Take 1 tablet (10 mg total) by mouth 3 (three) times daily as needed for muscle spasms. 90 tablet 11  . diphenhydrAMINE (BENADRYL) 25 mg capsule Take 2 capsules (50 mg total) by mouth as directed. Take prior to CT scan as directed (Patient not taking: Reported on 05/29/2017) 30 capsule 0  . dronabinol (MARINOL) 2.5 MG capsule Take 1 capsule (2.5 mg total) by mouth 2 (two) times daily before a meal. 60 capsule 0  . empagliflozin (JARDIANCE) 10 MG TABS tablet Take 10 mg by mouth daily. 90 tablet 3  . erythromycin ophthalmic ointment Place 1 application into both eyes 2 (two) times daily.  1  . glipiZIDE (GLUCOTROL) 10 MG tablet Take 2 tablets (20 mg total) by mouth 2 (two) times  daily before a meal. 360 tablet 3  . hydrOXYzine (ATARAX/VISTARIL) 10 MG tablet Take 1 tablet (10 mg total) by mouth 3 (three) times daily as needed for itching. 30 tablet 0  . lidocaine-prilocaine (EMLA) cream Apply 1 application topically as needed. 30 g 0  . lisinopril (PRINIVIL,ZESTRIL) 5 MG tablet Take 0.5 tablets (2.5 mg total) by mouth daily. 90 tablet 3  . metFORMIN (GLUCOPHAGE-XR) 500 MG 24 hr tablet Take 1 tablet (500 mg total) by mouth 2 (two) times daily. 180 tablet 3  . metoCLOPramide (REGLAN) 10 MG tablet TAKE 1 TABLET BY MOUTH FOUR TIMES DAILY AS NEEDED AS DIRECTED 30 tablet 0  . ondansetron (ZOFRAN) 4 MG tablet Take 1 tablet (4 mg total) by mouth 3 (three) times daily. (Patient not taking: Reported on 05/29/2017) 20 tablet 0  . oxyCODONE-acetaminophen (PERCOCET) 10-325 MG tablet Take 1-2 tablets by mouth every 6 (six) hours as needed for pain. 240 tablet 0  . oxyCODONE-acetaminophen (PERCOCET) 10-325 MG tablet Take 1 tablet by mouth every 6 (six) hours as needed for pain. 60 tablet 0  . pantoprazole (PROTONIX) 40 MG tablet Take 1 tablet (40 mg total) by mouth daily. 90 tablet 3  . predniSONE (DELTASONE) 50 MG tablet Take 1 tablet (50 mg total) by mouth as directed. Take 50 mg 13 hours. 7 hours and 1 hour prior to scan (Patient not taking: Reported on 05/29/2017) 3  tablet 2  . prochlorperazine (COMPAZINE) 10 MG tablet Take 10 mg by mouth every 6 (six) hours as needed for nausea or vomiting.    . prochlorperazine (COMPAZINE) 25 MG suppository Place 1 suppository (25 mg total) rectally every 12 (twelve) hours as needed for refractory nausea / vomiting. (Patient not taking: Reported on 05/29/2017) 12 suppository 11  . PROLENSA 0.07 % SOLN Place 1 drop into both eyes 2 (two) times daily.  0  . senna-docusate (SENOKOT-S) 8.6-50 MG tablet Take 1-2 tablets by mouth 2 (two) times daily as needed for mild constipation or moderate constipation. 30 tablet 1  . sitaGLIPtin (JANUVIA) 100 MG tablet Take 1  tablet (100 mg total) by mouth daily. 90 tablet 3  . sorbitol 70 % solution Take 15 cc every 4 hours until bowel movement (Patient not taking: Reported on 05/29/2017) 473 mL 0   Current Facility-Administered Medications  Medication Dose Route Frequency Provider Last Rate Last Dose  . cyanocobalamin ((VITAMIN B-12)) injection 1,000 mcg  1,000 mcg Intramuscular Q30 days [name], MD   1,000 mcg at 06/18/17 1425    SURGICAL HISTORY:  Past Surgical History:  Procedure Laterality Date  . CAROTID STENT Right ?2014  . COLONOSCOPY N/A 11/30/2015   Procedure: COLONOSCOPY;  Surgeon: Teena Irani, MD;  Location[facility] ENDOSCOPY;  Service: Endoscopy;  Laterality: N/A;  . ESOPHAGOGASTRODUODENOSCOPY N/A 11/30/2015   Procedure: ESOPHAGOGASTRODUODENOSCOPY (EGD);  Surgeon: Teena Irani, MD;  Location[facility] ENDOSCOPY;  Service: Endoscopy;  Laterality: N/A;  . ESOPHAGOGASTRODUODENOSCOPY (EGD) WITH PROPOFOL N/A 01/05/2016   Procedure: ESOPHAGOGASTRODUODENOSCOPY (EGD) WITH PROPOFOL;  Surgeon: Teena Irani, MD;  Location: [facility] ENDOSCOPY;  Service: Endoscopy;  Laterality: N/A;  . FEMORAL ARTERY STENT Left 05/2012; ~ 2015   Archie Endo 06/04/2012; Raechel Chute report  . FRACTURE SURGERY    . GIVENS CAPSULE STUDY N/A 12/22/2015   Procedure: GIVENS CAPSULE STUDY;  Surgeon: Wonda Horner, MD;  Location: [facility] ENDOSCOPY;  Service: Endoscopy;  Laterality: N/A;  . HARDWARE REMOVAL Right 11/15/2011   Removal of deep frontozygomatic orbital hardware/notes 11/15/2011  . HERNIA REPAIR  1700   Umbilical  . LAPAROSCOPY N/A 02/27/2016   Procedure: LAPAROSCOPY, LAPAROTOMY  WITH TWO SMALL BOWEL RESECTION;  Surgeon: Johnathan Hausen, MD;  Location: [facility] ORS;  Service: General;  Laterality: N/A;  . ORIF ORBITAL FRACTURE Right 08/15/2010    caught in a hydraulic machine; open reduction internal fixation of orbital rim fracture and open reduction of zygomatic arch fracture  Archie Endo 10/13/2009  . PORTACATH PLACEMENT Left 04/04/2016   Procedure: INSERTION  PORT-A-CATH LEFT CHEST;  Surgeon: Melrose Nakayama, MD;  Location: [facility];  Service: Thoracic;  Laterality: Left;  Marland Kitchen VIDEO ASSISTED THORACOSCOPY (VATS)/ LOBECTOMY Right 10/18/2015   Procedure: VIDEO ASSISTED THORACOSCOPY (VATS)/ LOBECTOMY;  Surgeon: Melrose Nakayama, MD;  Location: [facility];  Service: Thoracic;  Laterality: Right;  Marland Kitchen VIDEO BRONCHOSCOPY Bilateral 09/21/2015   Procedure: VIDEO BRONCHOSCOPY WITH FLUORO;  Surgeon: Juanito Doom, MD;  Location: [facility] ENDOSCOPY;  Service: Cardiopulmonary;  Laterality: Bilateral;    REVIEW OF SYSTEMS:  Constitutional: negative Eyes: negative Ears, nose, mouth, throat, and face: negative Respiratory: negative Cardiovascular: negative Gastrointestinal: negative Genitourinary:negative Integument/breast: negative Hematologic/lymphatic: negative Musculoskeletal:negative Neurological: negative Behavioral/Psych: negative Endocrine: negative Allergic/Immunologic: negative   PHYSICAL EXAMINATION: General appearance: alert, cooperative and no distress Head: Normocephalic, without obvious abnormality, atraumatic Neck: no adenopathy, no JVD, supple, symmetrical, trachea midline and thyroid not enlarged, symmetric, no tenderness/mass/nodules Lymph nodes: Cervical, supraclavicular, and axillary nodes normal. Resp: clear to auscultation bilaterally  Back: symmetric, no curvature. ROM normal. No CVA tenderness. Cardio: regular rate and rhythm, S1, S2 normal, no murmur, click, rub or gallop GI: soft, non-tender; bowel sounds normal; no masses,  no organomegaly Extremities: extremities normal, atraumatic, no cyanosis or edema Neurologic: Alert and oriented X 3, normal strength and tone. Normal symmetric reflexes. Normal coordination and gait  ECOG PERFORMANCE STATUS: 1 - Symptomatic but completely ambulatory  Blood pressure 106/64, pulse 69, temperature 98.1 F (36.7 C), temperature source Oral, resp. rate 17, height 5' 7"  (1.702 m), weight 118 lb 11.2  oz (53.8 kg), SpO2 100 %.  LABORATORY DATA: Lab Results  Component Value Date   WBC 8.9 06/19/2017   HGB 14.2 06/19/2017   HCT 42.0 06/19/2017   MCV 90.1 06/19/2017   PLT 225 06/19/2017      Chemistry      Component Value Date/Time   NA 139 05/29/2017 0908   NA 140 01/23/2017 0910   K 4.9 05/29/2017 0908   K 4.5 01/23/2017 0910   CL 105 05/29/2017 0908   CO2 27 05/29/2017 0908   CO2 28 01/23/2017 0910   BUN 14 05/29/2017 0908   BUN 8.1 01/23/2017 0910   CREATININE 1.04 05/29/2017 0908   CREATININE 0.8 01/23/2017 0910   GLU 398 (H) 08/08/2016 1257      Component Value Date/Time   CALCIUM 10.2 05/29/2017 0908   CALCIUM 9.6 01/23/2017 0910   ALKPHOS 120 05/29/2017 0908   ALKPHOS 113 01/23/2017 0910   AST 17 05/29/2017 0908   AST 11 01/23/2017 0910   ALT 18 05/29/2017 0908   ALT 12 01/23/2017 0910   BILITOT 0.4 05/29/2017 0908   BILITOT 0.40 01/23/2017 0910       RADIOGRAPHIC STUDIES: Ct Chest W Contrast  Result Date: 06/18/2017 CLINICAL DATA:  Right small cell lung cancer with metastatic disease to the small bowel, ongoing chemotherapy. EXAM: CT CHEST, ABDOMEN, AND PELVIS WITH CONTRAST TECHNIQUE: Multidetector CT imaging of the chest, abdomen and pelvis was performed following the standard protocol during bolus administration of intravenous contrast. CONTRAST:  127m ISOVUE-300 IOPAMIDOL (ISOVUE-300) INJECTION 61% The patient was administered are full 13 hour premedication regimen due to history of rash with contrast. COMPARISON:  04/10/2017 FINDINGS: CT CHEST FINDINGS Cardiovascular: Left Port-A-Cath tip: SVC. Coronary, aortic arch, and branch vessel atherosclerotic vascular disease. Mediastinum/Nodes: No pathologic adenopathy identified. Lungs/Pleura: Right upper lobectomy minimal scarring laterally in the left lower lobe. A 3 mm left upper lobe pulmonary nodule is stable on image 40/6. Musculoskeletal: Mild thoracic spondylosis. CT ABDOMEN PELVIS FINDINGS Hepatobiliary:  Contracted gallbladder. No focal liver lesion identified. No biliary dilatation. Pancreas: Atrophy of notable segments of the pancreas with some spurring of the tip of the tail and the lower head of the pancreas. This same appearance has been present chronically without hypermetabolic activity on prior PET-CT exams. Spleen: Unremarkable Adrenals/Urinary Tract: Unremarkable Stomach/Bowel: Prominent stool throughout the colon favors constipation. Postoperative findings along loops of small bowel Vascular/Lymphatic: Aortoiliac atherosclerotic vascular disease. A right abdominal mesenteric node measures 0.8 cm in short axis on image 78/2, previously 0.7 cm. Reproductive: Borderline prostatomegaly. Calcification in the right central prostate gland. Other: No supplemental non-categorized findings. Musculoskeletal: Mild degenerative disc disease potentially with some mild foraminal impingement at L4-5. IMPRESSION: 1. No compelling findings of recurrent malignancy in the chest, abdomen, or pelvis. The right mesenteric lymph node measures 8 mm in short axis, essentially stable compared to prior 7 mm measurement. 2. Stable 3 mm left upper lobe pulmonary nodule.  3.  Prominent stool throughout the colon favors constipation. 4. Postoperative findings along the small bowel. 5. Other imaging findings of potential clinical significance: Aortic Atherosclerosis (ICD10-I70.0). Coronary atherosclerosis. Right upper lobectomy. Borderline prostatomegaly. Degenerative disc disease at L4-5. Electronically Signed   By: Van Clines M.D.   On: 06/18/2017 16:36   Ct Abdomen Pelvis W Contrast  Result Date: 06/18/2017 CLINICAL DATA:  Right small cell lung cancer with metastatic disease to the small bowel, ongoing chemotherapy. EXAM: CT CHEST, ABDOMEN, AND PELVIS WITH CONTRAST TECHNIQUE: Multidetector CT imaging of the chest, abdomen and pelvis was performed following the standard protocol during bolus administration of intravenous  contrast. CONTRAST:  132m ISOVUE-300 IOPAMIDOL (ISOVUE-300) INJECTION 61% The patient was administered are full 13 hour premedication regimen due to history of rash with contrast. COMPARISON:  04/10/2017 FINDINGS: CT CHEST FINDINGS Cardiovascular: Left Port-A-Cath tip: SVC. Coronary, aortic arch, and branch vessel atherosclerotic vascular disease. Mediastinum/Nodes: No pathologic adenopathy identified. Lungs/Pleura: Right upper lobectomy minimal scarring laterally in the left lower lobe. A 3 mm left upper lobe pulmonary nodule is stable on image 40/6. Musculoskeletal: Mild thoracic spondylosis. CT ABDOMEN PELVIS FINDINGS Hepatobiliary: Contracted gallbladder. No focal liver lesion identified. No biliary dilatation. Pancreas: Atrophy of notable segments of the pancreas with some spurring of the tip of the tail and the lower head of the pancreas. This same appearance has been present chronically without hypermetabolic activity on prior PET-CT exams. Spleen: Unremarkable Adrenals/Urinary Tract: Unremarkable Stomach/Bowel: Prominent stool throughout the colon favors constipation. Postoperative findings along loops of small bowel Vascular/Lymphatic: Aortoiliac atherosclerotic vascular disease. A right abdominal mesenteric node measures 0.8 cm in short axis on image 78/2, previously 0.7 cm. Reproductive: Borderline prostatomegaly. Calcification in the right central prostate gland. Other: No supplemental non-categorized findings. Musculoskeletal: Mild degenerative disc disease potentially with some mild foraminal impingement at L4-5. IMPRESSION: 1. No compelling findings of recurrent malignancy in the chest, abdomen, or pelvis. The right mesenteric lymph node measures 8 mm in short axis, essentially stable compared to prior 7 mm measurement. 2. Stable 3 mm left upper lobe pulmonary nodule. 3.  Prominent stool throughout the colon favors constipation. 4. Postoperative findings along the small bowel. 5. Other imaging  findings of potential clinical significance: Aortic Atherosclerosis (ICD10-I70.0). Coronary atherosclerosis. Right upper lobectomy. Borderline prostatomegaly. Degenerative disc disease at L4-5. Electronically Signed   By: WVan ClinesM.D.   On: 06/18/2017 16:36     ASSESSMENT AND PLAN:  This is a very pleasant 57years old white male with metastatic non-small cell lung cancer, adenocarcinoma status post right upper lobectomy with lymph node dissection. Unfortunately the patient was found to have metastatic disease in the jejunum and terminal ileum.  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 The patient was started on treatment with systemic chemotherapy with carboplatin and Alimta for 2 cycles discontinued secondary to intolerance and disease progression. The patient is currently undergoing treatment with second line immunotherapy with Ketruda (pembrolizumab) 200 mg IV every 3 weeks, status post 18 cycles. He continues to tolerate this treatment well with no concerning complaints. Repeat CT scan of the chest, abdomen and pelvis showed no concerning findings for disease progression.  I personally and independently reviewed the scans and discussed the results with the patient and his wife.  I recommended for him [facility]. For the  itching, he will continue on Atarax as needed. For diabetes mellitus, he will continue his current treatment by his primary care physician. The patient was advised to call immediately if he has any concerning symptoms in the interval. The patient voices understanding of current disease status and treatment options and is in agreement with the current care plan. All questions were answered. The patient knows to call the clinic with any problems, questions or  concerns. We can certainly see the patient much sooner if necessary.  Disclaimer: This note was dictated with voice recognition software. Similar sounding words can inadvertently be transcribed and may not be corrected upon review.

## 2017-06-19 NOTE — Telephone Encounter (Signed)
Scheduled appt per 5/30 los - pt to get an updated schedule in treatment area.

## 2017-06-19 NOTE — Patient Instructions (Signed)
[facility] Discharge Instructions for Patients Receiving Chemotherapy  Today you received the following chemotherapy agents:  Keytruda (pembrolizumab)  To help prevent nausea and vomiting after your treatment, we encourage you to take your nausea medication as prescribed.   If you develop nausea and vomiting that is not controlled by your nausea medication, call the clinic.   BELOW ARE SYMPTOMS THAT SHOULD BE REPORTED IMMEDIATELY:  *FEVER GREATER THAN 100.5 F  *CHILLS WITH OR WITHOUT FEVER  NAUSEA AND VOMITING THAT IS NOT CONTROLLED WITH YOUR NAUSEA MEDICATION  *UNUSUAL SHORTNESS OF BREATH  *UNUSUAL BRUISING OR BLEEDING  TENDERNESS IN MOUTH AND THROAT WITH OR WITHOUT PRESENCE OF ULCERS  *URINARY PROBLEMS  *BOWEL PROBLEMS  UNUSUAL RASH Items with * indicate a potential emergency and should be followed up as soon as possible.  Feel free to call the clinic should you have any questions or concerns. The clinic phone number is [phone].  Please show the [facility] at check-in to the Emergency Department and triage nurse.

## 2017-07-09 NOTE — Assessment & Plan Note (Addendum)
This is a very pleasant 57 year old white male with metastatic non-small cell lung cancer, adenocarcinoma status post right upper lobectomy with lymph node dissection. Unfortunately the patient was found to have metastatic disease in the jejunum and terminal ileum.  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 The patient was started on treatment with systemic chemotherapy with carboplatin and Alimta for 2 cycles discontinued secondary to intolerance and disease progression. The patient is currently undergoing treatment with second line immunotherapy with Ketruda (pembrolizumab) 200 mg IV every 3 weeks, status post 19 cycles. He continues to tolerate this treatment well with no concerning complaints except for fatigue. Recommend for him to receive with cycle #20 of his treatment as scheduled today.  He will return in 3 weeks for evaluation prior to cycle #21.  For diabetes mellitus, he will continue his current treatment by his primary care physician.  For his appetite, I have refilled his Marinol.  Will follow-up on TSH that was drawn earlier today to determine if this is playing into his fatigue.  If abnormal, we will consider starting him on levothyroxine.  The patient was advised to call immediately if he has any concerning symptoms in the interval. The patient voices understanding of current disease status and treatment options and is in agreement with the current care plan. All questions were answered. The patient knows to call the clinic with any problems, questions or concerns. We can certainly see the patient much sooner if necessary.

## 2017-07-09 NOTE — Progress Notes (Signed)
[facility] OFFICE PROGRESS NOTE  [name], MD [place] [address]  DIAGNOSIS: Stage IV (T1b, N0, M1b) non-small cell lung cancer, adenocarcinoma presented with right upper lobe lung nodule and recent metastasis to the small intestine. This was initially diagnosed in September 2017.  Genomic Alterations Identified? ERBB2 amplification - equivocal? CDKN2A p16INK4a E88* and p14ARF R678L SMARCA4 splice site [id number] SPTA1 E2022* TOP2A amplification TP53 A159P Additional Findings? Microsatellite status MS-Stable Tumor Mutation Burden TMB-Intermediate; 18 Muts/Mb Additional Disease-relevant Genes with No Reportable Alterations Identified? EGFR KRAS ALK BRAF MET RET ROS1   PRIOR THERAPY: 1) Status post right VATS with right upper lobectomy and mediastinal lymph node dissection under the care of Dr. Roxan Hockey on 10/18/2015 and the final pathology was consistent with stage IA (T1b, N0, MX). 2) upper endoscopy on 01/05/2016 showed normal esophagus, normal stomach but there was occasional mass around 3.0 CM in length circumferential nonobstructing in the jejunum. The final pathology was consistent with metastatic adenocarcinoma. 3) status post laparoscopic laparotomy and resection of proximal lesion and and distal jejunum/proximal ileum under the care of Dr. Hassell Done 1 02/27/2016. 3)  Systemic chemotherapy with carboplatin for AUC of 5 and Alimta 500 MG/M2 every 3 weeks. First dose 04/04/2016. Status post 2 cycles. Last dose was given 04/21/2016 discontinued secondary to disease progression.  CURRENT THERAPY: Second line immunotherapy with Ketruda 200 mg IV every 2 weeks, first dose 05/30/2016. Status post 19 cycles.  INTERVAL HISTORY: Brett [name] 57 y.o. male returns for routine follow-up visit accompanied by his wife.  The patient is feeling fine today and has no specific complaints except for increased fatigue.  He was recently started on  vitamin B12 injections by his primary care provider.  He has not noticed much difference.  He denies fevers and chills.  Denies chest pain, shortness breath, cough, hemoptysis.  Denies nausea, vomiting, constipation, diarrhea.  Denies recent weight loss or night sweats.  Requests a refill of Marinol today.  Denies skin rashes.  The patient continues to tolerate treatment with [facility] fairly well.  The patient is here for evaluation prior to cycle #20 of his treatment.  MEDICAL HISTORY: Past Medical History:  Diagnosis Date  . Anemia 11/28/2015  . Anxiety   . Arthritis    "hands, back" (11/28/2015)  . Barrett's esophagus 07/01/2013   Without dysplasia on biopsy 09/03/2012. Repeat EGD recommended 08/2015  . Bilateral cataracts 02/13/2017  . Carotid artery stenosis 07/01/2013   Requiring right sided stent   . Chronic pain syndrome 07/01/2013  . Closed head injury with brief loss of consciousness ([place]) 07/22/2010   Head trapped in a hydraulic device at work.  Fracture of orbital bones on right and brief loss of consciousness per report.  . 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 There was likely a strong psycho-social overlay in regard to the cognitive deficits in the form of mood disorder with psychotic features and mixed anxiety symptomatology. His primary tested cognitive deficits are in the areas of attention, executi  . Daily headache "since 07/2010"   constantly  . Degenerative joint disease of cervical spine 07/01/2013  . Dehydration 04/11/2016  . Dupuytren's contracture of both hands 04/08/2014  . Encounter for antineoplastic chemotherapy 10/05/2015  . Encounter for antineoplastic immunotherapy 05/24/2016  . Erectile dysfunction associated with type 2 diabetes mellitus ([place]) 07/01/2013  . Fibromyalgia 07/01/2013  .  Goals of care, counseling/discussion  03/28/2016  . History of blood transfusion 11/28/2015   "suppose to get his first today" (11/28/2015)  . Hyperlipidemia LDL goal < 100 07/01/2013  . Intractable hiccups 04/11/2016  . 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 Memory changes    "memory issues" from head injury  . Moderate protein-calorie malnutrition ([place]) 11/29/2015  . Osteoarthritis of right thumb 10/21/2014  . Peripheral vascular occlusive disease ([place]) 07/01/2013   Requiring 2 arterial stents above the left knee per report  . Pneumonia ~ 2006/2007  . Post traumatic stress disorder 07/01/2013  . Primary lung adenocarcinoma ([place]) dx'd 08/2015   "right lung"  . Severe major depression with psychotic features ([place]) 04/15/2011  . Tobacco abuse 07/01/2013  . Tobacco abuse   . Type 2 diabetes mellitus with vascular disease ([place]) 07/01/2013   Left lower extremity and right carotid stenting    ALLERGIES:  is allergic to gabapentin; lyrica [pregabalin]; celebrex [celecoxib]; and contrast media [iodinated diagnostic agents].  MEDICATIONS:  Current Outpatient Medications  Medication Sig Dispense Refill  . aspirin EC 81 MG tablet Take 81 mg by mouth daily.     Marland Kitchen atorvastatin (LIPITOR) 40 MG tablet Take 1 tablet (40 mg total) by mouth daily. 90 tablet 3  . cyclobenzaprine (FLEXERIL) 10 MG tablet Take 1 tablet (10 mg total) by mouth 3 (three) times daily as needed for muscle spasms. 90 tablet 11  . dronabinol (MARINOL) 2.5 MG capsule Take 1 capsule (2.5 mg total) by mouth 2 (two) times daily before a meal. 60 capsule 0  . empagliflozin (JARDIANCE) 10 MG TABS tablet Take 10 mg by mouth daily. 90 tablet 3  . glipiZIDE (GLUCOTROL) 10 MG tablet Take 2 tablets (20 mg total) by mouth 2 (two) times daily before a meal. 360 tablet 3  . lisinopril (PRINIVIL,ZESTRIL) 5 MG tablet Take 0.5 tablets (2.5 mg total) by mouth daily. 90 tablet 3  . metFORMIN (GLUCOPHAGE-XR) 500 MG 24 hr tablet Take 1 tablet (500 mg total) by mouth 2 (two) times daily. 180  tablet 3  . oxyCODONE-acetaminophen (PERCOCET) 10-325 MG tablet Take 1 tablet by mouth every 6 (six) hours as needed for pain. 60 tablet 0  . pantoprazole (PROTONIX) 40 MG tablet Take 1 tablet (40 mg total) by mouth daily. 90 tablet 3  . sitaGLIPtin (JANUVIA) 100 MG tablet Take 1 tablet (100 mg total) by mouth daily. 90 tablet 3  . diphenhydrAMINE (BENADRYL) 25 mg capsule Take 2 capsules (50 mg total) by mouth as directed. Take prior to CT scan as directed (Patient not taking: Reported on 05/29/2017) 30 capsule 0  . erythromycin ophthalmic ointment Place 1 application into both eyes 2 (two) times daily.  1  . hydrOXYzine (ATARAX/VISTARIL) 10 MG tablet Take 1 tablet (10 mg total) by mouth 3 (three) times daily as needed for itching. (Patient not taking: Reported on 07/10/2017) 30 tablet 0  . lidocaine-prilocaine (EMLA) cream Apply 1 application topically as needed. (Patient not taking: Reported on 07/10/2017) 30 g 0  . metoCLOPramide (REGLAN) 10 MG tablet TAKE 1 TABLET BY MOUTH FOUR TIMES DAILY AS NEEDED AS DIRECTED (Patient not taking: Reported on 07/10/2017) 30 tablet 0  . ondansetron (ZOFRAN) 4 MG tablet Take 1 tablet (4 mg total) by mouth 3 (three) times daily. (Patient not taking: Reported on 07/10/2017) 20 tablet 0  . predniSONE (DELTASONE) 50 MG tablet Take 1 tablet (50 mg total) by mouth as directed. Take 50 mg 13 hours. 7 hours and 1 hour  prior to scan (Patient not taking: Reported on 05/29/2017) 3 tablet 2  . prochlorperazine (COMPAZINE) 10 MG tablet Take 10 mg by mouth every 6 (six) hours as needed for nausea or vomiting.    . prochlorperazine (COMPAZINE) 25 MG suppository Place 1 suppository (25 mg total) rectally every 12 (twelve) hours as needed for refractory nausea / vomiting. (Patient not taking: Reported on 05/29/2017) 12 suppository 11  . PROLENSA 0.07 % SOLN Place 1 drop into both eyes 2 (two) times daily.  0  . senna-docusate (SENOKOT-S) 8.6-50 MG tablet Take 1-2 tablets by mouth 2 (two)  times daily as needed for mild constipation or moderate constipation. (Patient not taking: Reported on 07/10/2017) 30 tablet 1  . sorbitol 70 % solution Take 15 cc every 4 hours until bowel movement (Patient not taking: Reported on 05/29/2017) 473 mL 0   Current Facility-Administered Medications  Medication Dose Route Frequency Provider Last Rate Last Dose  . cyanocobalamin ((VITAMIN B-12)) injection 1,000 mcg  1,000 mcg Intramuscular Q30 days [name], MD   1,000 mcg at 06/18/17 1425   Facility-Administered Medications Ordered in Other Visits  Medication Dose Route Frequency Provider Last Rate Last Dose  . 0.9 %  sodium chloride infusion   Intravenous Once Curt Bears, MD      . heparin lock flush 100 unit/mL  500 Units Intracatheter Once PRN Curt Bears, MD      . pembrolizumab [facility]) 200 mg in sodium chloride 0.9 % 50 mL chemo infusion  200 mg Intravenous Once Curt Bears, MD      . sodium chloride flush (NS) 0.9 % injection 10 mL  10 mL Intracatheter PRN Curt Bears, MD        SURGICAL HISTORY:  Past Surgical History:  Procedure Laterality Date  . CAROTID STENT Right ?2014  . COLONOSCOPY N/A 11/30/2015   Procedure: COLONOSCOPY;  Surgeon: Teena Irani, MD;  Location[facility] ENDOSCOPY;  Service: Endoscopy;  Laterality: N/A;  . ESOPHAGOGASTRODUODENOSCOPY N/A 11/30/2015   Procedure: ESOPHAGOGASTRODUODENOSCOPY (EGD);  Surgeon: Teena Irani, MD;  Location: [facility] ENDOSCOPY;  Service: Endoscopy;  Laterality: N/A;  . ESOPHAGOGASTRODUODENOSCOPY (EGD) WITH PROPOFOL N/A 01/05/2016   Procedure: ESOPHAGOGASTRODUODENOSCOPY (EGD) WITH PROPOFOL;  Surgeon: Teena Irani, MD;  Location: [facility] ENDOSCOPY;  Service: Endoscopy;  Laterality: N/A;  . FEMORAL ARTERY STENT Left 05/2012; ~ 2015   Archie Endo 06/04/2012; Raechel Chute report  . FRACTURE SURGERY    . GIVENS CAPSULE STUDY N/A 12/22/2015   Procedure: GIVENS CAPSULE STUDY;  Surgeon: Wonda Horner, MD;  Location: [facility] ENDOSCOPY;  Service: Endoscopy;   Laterality: N/A;  . HARDWARE REMOVAL Right 11/15/2011   Removal of deep frontozygomatic orbital hardware/notes 11/15/2011  . HERNIA REPAIR  [id number]   Umbilical  . LAPAROSCOPY N/A 02/27/2016   Procedure: LAPAROSCOPY, LAPAROTOMY  WITH TWO SMALL BOWEL RESECTION;  Surgeon: Johnathan Hausen, MD;  Location: [facility] ORS;  Service: General;  Laterality: N/A;  . ORIF ORBITAL FRACTURE Right 08/15/2010    caught in a hydraulic machine; open reduction internal fixation of orbital rim fracture and open reduction of zygomatic arch fracture  Archie Endo 10/13/2009  . PORTACATH PLACEMENT Left 04/04/2016   Procedure: INSERTION PORT-A-CATH LEFT CHEST;  Surgeon: Melrose Nakayama, MD;  Location: [place];  Service: Thoracic;  Laterality: Left;  Marland Kitchen VIDEO ASSISTED THORACOSCOPY (VATS)/ LOBECTOMY Right 10/18/2015   Procedure: VIDEO ASSISTED THORACOSCOPY (VATS)/ LOBECTOMY;  Surgeon: Melrose Nakayama, MD;  Location: [place];  Service: Thoracic;  Laterality: Right;  Marland Kitchen VIDEO BRONCHOSCOPY Bilateral 09/21/2015   Procedure:  VIDEO BRONCHOSCOPY WITH FLUORO;  Surgeon: Juanito Doom, MD;  Location: [facility] ENDOSCOPY;  Service: Cardiopulmonary;  Laterality: Bilateral;    REVIEW OF SYSTEMS:   Review of Systems  Constitutional: Negative for appetite change, chills, fever and unexpected weight change. Positive for fatigue. HENT:   Negative for mouth sores, nosebleeds, sore throat and trouble swallowing.   Eyes: Negative for eye problems and icterus.  Respiratory: Negative for cough, hemoptysis, shortness of breath and wheezing.   Cardiovascular: Negative for chest pain and leg swelling.  Gastrointestinal: Negative for abdominal pain, constipation, diarrhea, nausea and vomiting.  Genitourinary: Negative for bladder incontinence, difficulty urinating, dysuria, frequency and hematuria.   Musculoskeletal: Negative for back pain, gait problem, neck pain and neck stiffness.  Skin: Negative for itching and rash.  Neurological: Negative for dizziness,  extremity weakness, gait problem, headaches, light-headedness and seizures.  Hematological: Negative for adenopathy. Does not bruise/bleed easily.  Psychiatric/Behavioral: Negative for confusion, depression and sleep disturbance. The patient is not nervous/anxious.     PHYSICAL EXAMINATION:  Blood pressure 98/77, pulse 80, temperature 98.1 F (36.7 C), temperature source Oral, resp. rate 18, height 5' 7"  (1.702 m), weight 118 lb 1.6 oz (53.6 kg), SpO2 100 %.  ECOG PERFORMANCE STATUS: 1 - Symptomatic but completely ambulatory  Physical Exam  Constitutional: Oriented to person, place, and time and well-developed, well-nourished, and in no distress. No distress.  HENT:  Head: Normocephalic and atraumatic.  Mouth/Throat: Oropharynx is clear and moist. No oropharyngeal exudate.  Eyes: Conjunctivae are normal. Right eye exhibits no discharge. Left eye exhibits no discharge. No scleral icterus.  Neck: Normal range of motion. Neck supple.  Cardiovascular: Normal rate, regular rhythm, normal heart sounds and intact distal pulses.   Pulmonary/Chest: Effort normal and breath sounds normal. No respiratory distress. No wheezes. No rales.  Abdominal: Soft. Bowel sounds are normal. Exhibits no distension and no mass. There is no tenderness.  Musculoskeletal: Normal range of motion. Exhibits no edema.  Lymphadenopathy:    No cervical adenopathy.  Neurological: Alert and oriented to person, place, and time. Exhibits normal muscle tone. Gait normal. Coordination normal.  Skin: Skin is warm and dry. No rash noted. Not diaphoretic. No erythema. No pallor.  Psychiatric: Mood, memory and judgment normal.  Vitals reviewed.  LABORATORY DATA: Lab Results  Component Value Date   WBC 6.5 07/10/2017   HGB 15.0 07/10/2017   HCT 44.9 07/10/2017   MCV 91.0 07/10/2017   PLT 191 07/10/2017      Chemistry      Component Value Date/Time   NA 140 07/10/2017 0906   NA 140 01/23/2017 0910   K 4.4 07/10/2017  0906   K 4.5 01/23/2017 0910   CL 105 07/10/2017 0906   CO2 26 07/10/2017 0906   CO2 28 01/23/2017 0910   BUN 15 07/10/2017 0906   BUN 8.1 01/23/2017 0910   CREATININE 0.81 07/10/2017 0906   CREATININE 0.8 01/23/2017 0910   GLU 398 (H) 08/08/2016 1257      Component Value Date/Time   CALCIUM 9.6 07/10/2017 0906   CALCIUM 9.6 01/23/2017 0910   ALKPHOS 129 07/10/2017 0906   ALKPHOS 113 01/23/2017 0910   AST 14 07/10/2017 0906   AST 11 01/23/2017 0910   ALT 9 07/10/2017 0906   ALT 12 01/23/2017 0910   BILITOT 0.4 07/10/2017 0906   BILITOT 0.40 01/23/2017 0910       RADIOGRAPHIC STUDIES:  Ct Chest W Contrast  Result Date: 06/18/2017 CLINICAL DATA:  Right small  cell lung cancer with metastatic disease to the small bowel, ongoing chemotherapy. EXAM: CT CHEST, ABDOMEN, AND PELVIS WITH CONTRAST TECHNIQUE: Multidetector CT imaging of the chest, abdomen and pelvis was performed following the standard protocol during bolus administration of intravenous contrast. CONTRAST:  157m ISOVUE-300 IOPAMIDOL (ISOVUE-300) INJECTION 61% The patient was administered are full 13 hour premedication regimen due to history of rash with contrast. COMPARISON:  04/10/2017 FINDINGS: CT CHEST FINDINGS Cardiovascular: Left Port-A-Cath tip: SVC. Coronary, aortic arch, and branch vessel atherosclerotic vascular disease. Mediastinum/Nodes: No pathologic adenopathy identified. Lungs/Pleura: Right upper lobectomy minimal scarring laterally in the left lower lobe. A 3 mm left upper lobe pulmonary nodule is stable on image 40/6. Musculoskeletal: Mild thoracic spondylosis. CT ABDOMEN PELVIS FINDINGS Hepatobiliary: Contracted gallbladder. No focal liver lesion identified. No biliary dilatation. Pancreas: Atrophy of notable segments of the pancreas with some spurring of the tip of the tail and the lower head of the pancreas. This same appearance has been present chronically without hypermetabolic activity on prior PET-CT exams.  Spleen: Unremarkable Adrenals/Urinary Tract: Unremarkable Stomach/Bowel: Prominent stool throughout the colon favors constipation. Postoperative findings along loops of small bowel Vascular/Lymphatic: Aortoiliac atherosclerotic vascular disease. A right abdominal mesenteric node measures 0.8 cm in short axis on image 78/2, previously 0.7 cm. Reproductive: Borderline prostatomegaly. Calcification in the right central prostate gland. Other: No supplemental non-categorized findings. Musculoskeletal: Mild degenerative disc disease potentially with some mild foraminal impingement at L4-5. IMPRESSION: 1. No compelling findings of recurrent malignancy in the chest, abdomen, or pelvis. The right mesenteric lymph node measures 8 mm in short axis, essentially stable compared to prior 7 mm measurement. 2. Stable 3 mm left upper lobe pulmonary nodule. 3.  Prominent stool throughout the colon favors constipation. 4. Postoperative findings along the small bowel. 5. Other imaging findings of potential clinical significance: Aortic Atherosclerosis (ICD10-I70.0). Coronary atherosclerosis. Right upper lobectomy. Borderline prostatomegaly. Degenerative disc disease at L4-5. Electronically Signed   By: WVan ClinesM.D.   On: 06/18/2017 16:36   Ct Abdomen Pelvis W Contrast  Result Date: 06/18/2017 CLINICAL DATA:  Right small cell lung cancer with metastatic disease to the small bowel, ongoing chemotherapy. EXAM: CT CHEST, ABDOMEN, AND PELVIS WITH CONTRAST TECHNIQUE: Multidetector CT imaging of the chest, abdomen and pelvis was performed following the standard protocol during bolus administration of intravenous contrast. CONTRAST:  1033mISOVUE-300 IOPAMIDOL (ISOVUE-300) INJECTION 61% The patient was administered are full 13 hour premedication regimen due to history of rash with contrast. COMPARISON:  04/10/2017 FINDINGS: CT CHEST FINDINGS Cardiovascular: Left Port-A-Cath tip: SVC. Coronary, aortic arch, and branch vessel  atherosclerotic vascular disease. Mediastinum/Nodes: No pathologic adenopathy identified. Lungs/Pleura: Right upper lobectomy minimal scarring laterally in the left lower lobe. A 3 mm left upper lobe pulmonary nodule is stable on image 40/6. Musculoskeletal: Mild thoracic spondylosis. CT ABDOMEN PELVIS FINDINGS Hepatobiliary: Contracted gallbladder. No focal liver lesion identified. No biliary dilatation. Pancreas: Atrophy of notable segments of the pancreas with some spurring of the tip of the tail and the lower head of the pancreas. This same appearance has been present chronically without hypermetabolic activity on prior PET-CT exams. Spleen: Unremarkable Adrenals/Urinary Tract: Unremarkable Stomach/Bowel: Prominent stool throughout the colon favors constipation. Postoperative findings along loops of small bowel Vascular/Lymphatic: Aortoiliac atherosclerotic vascular disease. A right abdominal mesenteric node measures 0.8 cm in short axis on image 78/2, previously 0.7 cm. Reproductive: Borderline prostatomegaly. Calcification in the right central prostate gland. Other: No supplemental non-categorized findings. Musculoskeletal: Mild degenerative disc disease potentially with some mild foraminal  impingement at L4-5. IMPRESSION: 1. No compelling findings of recurrent malignancy in the chest, abdomen, or pelvis. The right mesenteric lymph node measures 8 mm in short axis, essentially stable compared to prior 7 mm measurement. 2. Stable 3 mm left upper lobe pulmonary nodule. 3.  Prominent stool throughout the colon favors constipation. 4. Postoperative findings along the small bowel. 5. Other imaging findings of potential clinical significance: Aortic Atherosclerosis (ICD10-I70.0). Coronary atherosclerosis. Right upper lobectomy. Borderline prostatomegaly. Degenerative disc disease at L4-5. Electronically Signed   By: Van Clines M.D.   On: 06/18/2017 16:36     ASSESSMENT/PLAN:  Primary adenocarcinoma of  right lung [facility]) This is a very pleasant 57 year old white male with metastatic non-small cell lung cancer, adenocarcinoma status post right upper lobectomy with lymph node dissection. Unfortunately the patient was found to have metastatic disease in the jejunum and terminal ileum.  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 The patient was started on treatment with systemic chemotherapy with carboplatin and Alimta for 2 cycles discontinued secondary to intolerance and disease progression. The patient is currently undergoing treatment with second line immunotherapy with Ketruda (pembrolizumab) 200 mg IV every 3 weeks, status post 19 cycles. He continues to tolerate this treatment well with no concerning complaints except for fatigue. Recommend for him to receive with cycle #20 of his treatment as scheduled today.  He will return in 3 weeks for evaluation prior to cycle #21.  For diabetes mellitus, he will continue his current treatment by his primary care physician.  For his appetite, I have refilled his Marinol.  Will follow-up on TSH that was drawn earlier today to determine if this is playing into his fatigue.  If abnormal, we will consider starting him on levothyroxine.  The patient was advised to call immediately if he has any concerning symptoms in the interval. The patient voices understanding of current disease status and treatment options and is in agreement with the current care plan. All questions were answered. The patient knows to call the clinic with any problems, questions or concerns. We can certainly see the patient much sooner if necessary.   No orders of the defined types were placed in this encounter.  Brett Bussing, DNP, AGPCNP-BC, AOCNP 07/10/17

## 2017-07-10 ENCOUNTER — Inpatient Hospital Stay: Payer: PPO

## 2017-07-10 ENCOUNTER — Telehealth: Payer: Self-pay | Admitting: Oncology

## 2017-07-10 ENCOUNTER — Inpatient Hospital Stay: Payer: PPO | Attending: Internal Medicine | Admitting: Oncology

## 2017-07-10 ENCOUNTER — Encounter: Payer: Self-pay | Admitting: Oncology

## 2017-07-10 VITALS — BP 98/77 | HR 80 | Temp 98.1°F | Resp 18 | Ht 67.0 in | Wt 118.1 lb

## 2017-07-10 DIAGNOSIS — C3491 Malignant neoplasm of unspecified part of right bronchus or lung: Secondary | ICD-10-CM

## 2017-07-10 DIAGNOSIS — Z79899 Other long term (current) drug therapy: Secondary | ICD-10-CM | POA: Diagnosis not present

## 2017-07-10 DIAGNOSIS — R5383 Other fatigue: Secondary | ICD-10-CM | POA: Insufficient documentation

## 2017-07-10 DIAGNOSIS — C3411 Malignant neoplasm of upper lobe, right bronchus or lung: Secondary | ICD-10-CM | POA: Diagnosis not present

## 2017-07-10 DIAGNOSIS — Z5112 Encounter for antineoplastic immunotherapy: Secondary | ICD-10-CM

## 2017-07-10 DIAGNOSIS — R630 Anorexia: Secondary | ICD-10-CM | POA: Diagnosis not present

## 2017-07-10 DIAGNOSIS — C784 Secondary malignant neoplasm of small intestine: Secondary | ICD-10-CM | POA: Diagnosis not present

## 2017-07-10 DIAGNOSIS — E119 Type 2 diabetes mellitus without complications: Secondary | ICD-10-CM | POA: Insufficient documentation

## 2017-07-10 LAB — CMP (CANCER CENTER ONLY)
ALT: 9 U/L (ref 0–55)
AST: 14 U/L (ref 5–34)
Albumin: 4 g/dL (ref 3.5–5.0)
Alkaline Phosphatase: 129 U/L (ref 40–150)
Anion gap: 9 (ref 3–11)
BUN: 15 mg/dL (ref 7–26)
CO2: 26 mmol/L (ref 22–29)
Calcium: 9.6 mg/dL (ref 8.4–10.4)
Chloride: 105 mmol/L (ref 98–109)
Creatinine: 0.81 mg/dL (ref 0.70–1.30)
GFR, Est AFR Am: >60 mL/min (ref >60)
GFR, Estimated: >60 mL/min (ref >60)
Glucose, Bld: 183 mg/dL — ABNORMAL HIGH (ref 70–140)
Potassium: 4.4 mmol/L (ref 3.5–5.1)
Sodium: 140 mmol/L (ref 136–145)
Total Bilirubin: 0.4 mg/dL (ref 0.2–1.2)
Total Protein: 7.3 g/dL (ref 6.4–8.3)

## 2017-07-10 LAB — CBC WITH DIFFERENTIAL (CANCER CENTER ONLY)
Basophils Absolute: 0 10*3/uL (ref 0.0–0.1)
Basophils Relative: 1 %
Eosinophils Absolute: 0.3 10*3/uL (ref 0.0–0.5)
Eosinophils Relative: 5 %
HCT: 44.9 % (ref 38.4–49.9)
Hemoglobin: 15 g/dL (ref 13.0–17.1)
Lymphocytes Relative: 16 %
Lymphs Abs: 1 10*3/uL (ref 0.9–3.3)
MCH: 30.3 pg (ref 27.2–33.4)
MCHC: 33.3 g/dL (ref 32.0–36.0)
MCV: 91 fL (ref 79.3–98.0)
Monocytes Absolute: 0.6 10*3/uL (ref 0.1–0.9)
Monocytes Relative: 10 %
Neutro Abs: 4.4 10*3/uL (ref 1.5–6.5)
Neutrophils Relative %: 68 %
Platelet Count: 191 10*3/uL (ref 140–400)
RBC: 4.93 MIL/uL (ref 4.20–5.82)
RDW: 15 % — ABNORMAL HIGH (ref 11.0–14.6)
WBC Count: 6.5 10*3/uL (ref 4.0–10.3)

## 2017-07-10 LAB — TSH: TSH: 4.3 u[IU]/mL — ABNORMAL HIGH (ref 0.320–4.118)

## 2017-07-10 MED ORDER — SODIUM CHLORIDE 0.9 % IV SOLN
Freq: Once | INTRAVENOUS | Status: AC
  Administered 2017-07-10: 10:00:00 via INTRAVENOUS

## 2017-07-10 MED ORDER — SODIUM CHLORIDE 0.9% FLUSH
10.0000 mL | INTRAVENOUS | Status: DC | PRN
  Administered 2017-07-10: 10 mL
  Filled 2017-07-10: qty 10

## 2017-07-10 MED ORDER — DRONABINOL 2.5 MG PO CAPS: 2.5000 mg | ORAL_CAPSULE | Freq: Two times a day (BID) | ORAL | 0 refills | Status: DC

## 2017-07-10 MED ORDER — PEMBROLIZUMAB CHEMO INJECTION 100 MG/4ML
200.0000 mg | Freq: Once | INTRAVENOUS | Status: AC
  Administered 2017-07-10: 200 mg via INTRAVENOUS
  Filled 2017-07-10: qty 8

## 2017-07-10 MED ORDER — HEPARIN SOD (PORK) LOCK FLUSH 100 UNIT/ML IV SOLN
500.0000 [IU] | Freq: Once | INTRAVENOUS | Status: AC | PRN
  Administered 2017-07-10: 500 [IU]
  Filled 2017-07-10: qty 5

## 2017-07-10 NOTE — Telephone Encounter (Signed)
Next three cycles already scheduled per 6/20 los -

## 2017-07-10 NOTE — Patient Instructions (Signed)
[facility] Discharge Instructions for Patients Receiving Chemotherapy  Today you received the following chemotherapy agents:  Keytruda.  To help prevent nausea and vomiting after your treatment, we encourage you to take your nausea medication as directed.   If you develop nausea and vomiting that is not controlled by your nausea medication, call the clinic.   BELOW ARE SYMPTOMS THAT SHOULD BE REPORTED IMMEDIATELY:  *FEVER GREATER THAN 100.5 F  *CHILLS WITH OR WITHOUT FEVER  NAUSEA AND VOMITING THAT IS NOT CONTROLLED WITH YOUR NAUSEA MEDICATION  *UNUSUAL SHORTNESS OF BREATH  *UNUSUAL BRUISING OR BLEEDING  TENDERNESS IN MOUTH AND THROAT WITH OR WITHOUT PRESENCE OF ULCERS  *URINARY PROBLEMS  *BOWEL PROBLEMS  UNUSUAL RASH Items with * indicate a potential emergency and should be followed up as soon as possible.  Feel free to call the clinic should you have any questions or concerns. The clinic phone number is [phone].  Please show the CHEMO ALERT CARD at check-in to the Emergency Department and triage nurse.    

## 2017-07-23 NOTE — Progress Notes (Signed)
PA for Dronabinol has been submitted.

## 2017-07-28 ENCOUNTER — Encounter: Payer: Self-pay | Admitting: Internal Medicine

## 2017-07-28 NOTE — Progress Notes (Signed)
Pt's Dronabinol was approved from 07/23/17 to 01/20/18.

## 2017-07-31 ENCOUNTER — Encounter: Payer: Self-pay | Admitting: Internal Medicine

## 2017-07-31 ENCOUNTER — Inpatient Hospital Stay: Payer: PPO

## 2017-07-31 ENCOUNTER — Telehealth: Payer: Self-pay

## 2017-07-31 ENCOUNTER — Inpatient Hospital Stay: Payer: PPO | Attending: Internal Medicine | Admitting: Internal Medicine

## 2017-07-31 VITALS — BP 107/58 | HR 83 | Temp 97.9°F | Resp 18 | Ht 67.0 in | Wt 115.8 lb

## 2017-07-31 DIAGNOSIS — Z5112 Encounter for antineoplastic immunotherapy: Secondary | ICD-10-CM

## 2017-07-31 DIAGNOSIS — C3491 Malignant neoplasm of unspecified part of right bronchus or lung: Secondary | ICD-10-CM

## 2017-07-31 DIAGNOSIS — C3411 Malignant neoplasm of upper lobe, right bronchus or lung: Secondary | ICD-10-CM | POA: Diagnosis not present

## 2017-07-31 DIAGNOSIS — C784 Secondary malignant neoplasm of small intestine: Secondary | ICD-10-CM | POA: Insufficient documentation

## 2017-07-31 DIAGNOSIS — Z79899 Other long term (current) drug therapy: Secondary | ICD-10-CM | POA: Diagnosis not present

## 2017-07-31 LAB — CBC WITH DIFFERENTIAL (CANCER CENTER ONLY)
Basophils Absolute: 0.1 10*3/uL (ref 0.0–0.1)
Basophils Relative: 2 %
Eosinophils Absolute: 0.6 10*3/uL — ABNORMAL HIGH (ref 0.0–0.5)
Eosinophils Relative: 7 %
HCT: 46.1 % (ref 38.4–49.9)
Hemoglobin: 15.3 g/dL (ref 13.0–17.1)
Lymphocytes Relative: 18 %
Lymphs Abs: 1.4 10*3/uL (ref 0.9–3.3)
MCH: 29.9 pg (ref 27.2–33.4)
MCHC: 33.2 g/dL (ref 32.0–36.0)
MCV: 90.1 fL (ref 79.3–98.0)
Monocytes Absolute: 0.6 10*3/uL (ref 0.1–0.9)
Monocytes Relative: 8 %
Neutro Abs: 5.3 10*3/uL (ref 1.5–6.5)
Neutrophils Relative %: 65 %
Platelet Count: 197 10*3/uL (ref 140–400)
RBC: 5.12 MIL/uL (ref 4.20–5.82)
RDW: 14.9 % — ABNORMAL HIGH (ref 11.0–14.6)
WBC Count: 8.1 10*3/uL (ref 4.0–10.3)

## 2017-07-31 LAB — CMP (CANCER CENTER ONLY)
ALT: 13 U/L (ref 0–44)
AST: 12 U/L — ABNORMAL LOW (ref 15–41)
Albumin: 4.3 g/dL (ref 3.5–5.0)
Alkaline Phosphatase: 150 U/L — ABNORMAL HIGH (ref 38–126)
Anion gap: 10 (ref 5–15)
BUN: 15 mg/dL (ref 6–20)
CO2: 26 mmol/L (ref 22–32)
Calcium: 10.2 mg/dL (ref 8.9–10.3)
Chloride: 102 mmol/L (ref 98–111)
Creatinine: 0.86 mg/dL (ref 0.61–1.24)
GFR, Est AFR Am: >60 mL/min (ref >60)
GFR, Estimated: >60 mL/min (ref >60)
Glucose, Bld: 273 mg/dL — ABNORMAL HIGH (ref 70–99)
Potassium: 4.4 mmol/L (ref 3.5–5.1)
Sodium: 138 mmol/L (ref 135–145)
Total Bilirubin: 0.4 mg/dL (ref 0.3–1.2)
Total Protein: 7.8 g/dL (ref 6.5–8.1)

## 2017-07-31 LAB — TSH: TSH: 2.868 u[IU]/mL (ref 0.320–4.118)

## 2017-07-31 NOTE — Progress Notes (Signed)
[facility] [phone]   [phone]  OFFICE PROGRESS NOTE  [name], MD [address] [place] [address]  DIAGNOSIS: Stage IV (T1b, N0, M1b) non-small cell lung cancer, adenocarcinoma presented with right upper lobe lung nodule and recent metastasis to the small intestine. This was initially diagnosed in September 2017.  Genomic Alterations Identified? ERBB2 amplification - equivocal? CDKN2A p16INK4a E88* and p14ARF O175Z SMARCA4 splice site [id number]>MP SPTA1 E2022* TOP2A amplification TP53 A159P Additional Findings? Microsatellite status MS-Stable Tumor Mutation Burden TMB-Intermediate; 18 Muts/Mb Additional Disease-relevant Genes with No Reportable Alterations Identified? EGFR KRAS ALK BRAF MET RET ROS1   PRIOR THERAPY:  1) Status post right VATS with right upper lobectomy and mediastinal lymph node dissection under the care of Dr. Roxan Hockey on 10/18/2015 and the final pathology was consistent with stage IA (T1b, N0, MX). 2) upper endoscopy on 01/05/2016 showed normal esophagus, normal stomach but there was occasional mass around 3.0 CM in length circumferential nonobstructing in the jejunum. The final pathology was consistent with metastatic adenocarcinoma. 3) status post laparoscopic laparotomy and resection of proximal lesion and and distal jejunum/proximal ileum under the care of Dr. Hassell Done 1 02/27/2016. 3)  Systemic chemotherapy with carboplatin for AUC of 5 and Alimta 500 MG/M2 every 3 weeks. First dose 04/04/2016. Status post 2 cycles. Last dose was given 04/21/2016 discontinued secondary to disease progression.   CURRENT THERAPY: Second line immunotherapy with Ketruda 200 mg IV every 2 weeks, first dose 05/30/2016. Status post 20 cycles.  INTERVAL HISTORY: Brett [name] 57 y.o. male returns to the clinic today for follow-up visit.  The patient is feeling fine today with no concerning complaints.  He denied  having any chest pain, shortness of breath, cough or hemoptysis.  He denied having any weight loss or night sweats.  He has no nausea, vomiting, diarrhea or constipation.  The patient has no headache or visual changes.  He has no fever or chills.  Has been tolerating his treatment with Keytruda fairly well.  Is here today for evaluation before starting cycle #21.  MEDICAL HISTORY: Past Medical History:  Diagnosis Date  . Anemia 11/28/2015  . Anxiety   . Arthritis    "hands, back" (11/28/2015)  . Barrett's esophagus 07/01/2013   Without dysplasia on biopsy 09/03/2012. Repeat EGD recommended 08/2015  . Bilateral cataracts 02/13/2017  . Carotid artery stenosis 07/01/2013   Requiring right sided stent   . Chronic pain syndrome 07/01/2013  . Closed head injury with brief loss of consciousness ([place]) 07/22/2010   Head trapped in a hydraulic device at work.  Fracture of orbital bones on right and brief loss of consciousness per report.  . 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 There was likely a strong psycho-social overlay in regard to the cognitive deficits in the form of mood disorder with psychotic features and mixed anxiety symptomatology. His primary tested cognitive deficits are in the areas of attention, executi  . Daily headache "since 07/2010"   constantly  . Degenerative joint disease of cervical spine 07/01/2013  . Dehydration 04/11/2016  . Dupuytren's contracture of both hands 04/08/2014  . Encounter for antineoplastic chemotherapy 10/05/2015  . Encounter for antineoplastic immunotherapy 05/24/2016  . Erectile dysfunction associated with type 2 diabetes mellitus ([place]) 07/01/2013  . Fibromyalgia 07/01/2013  . Goals of care, counseling/discussion 03/28/2016  . History of blood  transfusion 11/28/2015   "suppose to get his first today" (11/28/2015)  .  Hyperlipidemia LDL goal < 100 07/01/2013  . Intractable hiccups 04/11/2016  . 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 Memory changes    "memory issues" from head injury  . Moderate protein-calorie malnutrition ([place]) 11/29/2015  . Osteoarthritis of right thumb 10/21/2014  . Peripheral vascular occlusive disease ([place]) 07/01/2013   Requiring 2 arterial stents above the left knee per report  . Pneumonia ~ 2006/2007  . Post traumatic stress disorder 07/01/2013  . Primary lung adenocarcinoma ([place]) dx'd 08/2015   "right lung"  . Severe major depression with psychotic features ([place]) 04/15/2011  . Tobacco abuse 07/01/2013  . Tobacco abuse   . Type 2 diabetes mellitus with vascular disease ([place]) 07/01/2013   Left lower extremity and right carotid stenting    ALLERGIES:  is allergic to gabapentin; lyrica [pregabalin]; celebrex [celecoxib]; and contrast media [iodinated diagnostic agents].  MEDICATIONS:  Current Outpatient Medications  Medication Sig Dispense Refill  . aspirin EC 81 MG tablet Take 81 mg by mouth daily.     Marland Kitchen atorvastatin (LIPITOR) 40 MG tablet Take 1 tablet (40 mg total) by mouth daily. 90 tablet 3  . cyclobenzaprine (FLEXERIL) 10 MG tablet Take 1 tablet (10 mg total) by mouth 3 (three) times daily as needed for muscle spasms. 90 tablet 11  . diphenhydrAMINE (BENADRYL) 25 mg capsule Take 2 capsules (50 mg total) by mouth as directed. Take prior to CT scan as directed (Patient not taking: Reported on 05/29/2017) 30 capsule 0  . dronabinol (MARINOL) 2.5 MG capsule Take 1 capsule (2.5 mg total) by mouth 2 (two) times daily before a meal. 60 capsule 0  . empagliflozin (JARDIANCE) 10 MG TABS tablet Take 10 mg by mouth daily. 90 tablet 3  . erythromycin ophthalmic ointment Place 1 application into both eyes 2 (two) times daily.  1  . glipiZIDE (GLUCOTROL) 10 MG tablet Take 2 tablets (20 mg total) by mouth 2 (two) times daily before a meal. 360 tablet 3  . hydrOXYzine (ATARAX/VISTARIL) 10 MG  tablet Take 1 tablet (10 mg total) by mouth 3 (three) times daily as needed for itching. (Patient not taking: Reported on 07/10/2017) 30 tablet 0  . lidocaine-prilocaine (EMLA) cream Apply 1 application topically as needed. (Patient not taking: Reported on 07/10/2017) 30 g 0  . lisinopril (PRINIVIL,ZESTRIL) 5 MG tablet Take 0.5 tablets (2.5 mg total) by mouth daily. 90 tablet 3  . metFORMIN (GLUCOPHAGE-XR) 500 MG 24 hr tablet Take 1 tablet (500 mg total) by mouth 2 (two) times daily. 180 tablet 3  . metoCLOPramide (REGLAN) 10 MG tablet TAKE 1 TABLET BY MOUTH FOUR TIMES DAILY AS NEEDED AS DIRECTED (Patient not taking: Reported on 07/10/2017) 30 tablet 0  . ondansetron (ZOFRAN) 4 MG tablet Take 1 tablet (4 mg total) by mouth 3 (three) times daily. (Patient not taking: Reported on 07/10/2017) 20 tablet 0  . oxyCODONE-acetaminophen (PERCOCET) 10-325 MG tablet Take 1 tablet by mouth every 6 (six) hours as needed for pain. 60 tablet 0  . pantoprazole (PROTONIX) 40 MG tablet Take 1 tablet (40 mg total) by mouth daily. 90 tablet 3  . predniSONE (DELTASONE) 50 MG tablet Take 1 tablet (50 mg total) by mouth as directed. Take 50 mg 13 hours. 7 hours and 1 hour prior to scan (Patient not taking: Reported on 05/29/2017) 3 tablet 2  . prochlorperazine (COMPAZINE) 10 MG tablet Take 10 mg by mouth every 6 (six) hours as  needed for nausea or vomiting.    . prochlorperazine (COMPAZINE) 25 MG suppository Place 1 suppository (25 mg total) rectally every 12 (twelve) hours as needed for refractory nausea / vomiting. (Patient not taking: Reported on 05/29/2017) 12 suppository 11  . PROLENSA 0.07 % SOLN Place 1 drop into both eyes 2 (two) times daily.  0  . senna-docusate (SENOKOT-S) 8.6-50 MG tablet Take 1-2 tablets by mouth 2 (two) times daily as needed for mild constipation or moderate constipation. (Patient not taking: Reported on 07/10/2017) 30 tablet 1  . sitaGLIPtin (JANUVIA) 100 MG tablet Take 1 tablet (100 mg total) by mouth  daily. 90 tablet 3  . sorbitol 70 % solution Take 15 cc every 4 hours until bowel movement (Patient not taking: Reported on 05/29/2017) 473 mL 0   Current Facility-Administered Medications  Medication Dose Route Frequency Provider Last Rate Last Dose  . cyanocobalamin ((VITAMIN B-12)) injection 1,000 mcg  1,000 mcg Intramuscular Q30 days [name], MD   1,000 mcg at 06/18/17 1425    SURGICAL HISTORY:  Past Surgical History:  Procedure Laterality Date  . CAROTID STENT Right ?2014  . COLONOSCOPY N/A 11/30/2015   Procedure: COLONOSCOPY;  Surgeon: Teena Irani, MD;  Location: [facility] ENDOSCOPY;  Service: Endoscopy;  Laterality: N/A;  . ESOPHAGOGASTRODUODENOSCOPY N/A 11/30/2015   Procedure: ESOPHAGOGASTRODUODENOSCOPY (EGD);  Surgeon: Teena Irani, MD;  Location[facility] ENDOSCOPY;  Service: Endoscopy;  Laterality: N/A;  . ESOPHAGOGASTRODUODENOSCOPY (EGD) WITH PROPOFOL N/A 01/05/2016   Procedure: ESOPHAGOGASTRODUODENOSCOPY (EGD) WITH PROPOFOL;  Surgeon: Teena Irani, MD;  Location: [facility] ENDOSCOPY;  Service: Endoscopy;  Laterality: N/A;  . FEMORAL ARTERY STENT Left 05/2012; ~ 2015   Archie Endo 06/04/2012; Raechel Chute report  . FRACTURE SURGERY    . GIVENS CAPSULE STUDY N/A 12/22/2015   Procedure: GIVENS CAPSULE STUDY;  Surgeon: Wonda Horner, MD;  Location: [facility] ENDOSCOPY;  Service: Endoscopy;  Laterality: N/A;  . HARDWARE REMOVAL Right 11/15/2011   Removal of deep frontozygomatic orbital hardware/notes 11/15/2011  . HERNIA REPAIR  0932   Umbilical  . LAPAROSCOPY N/A 02/27/2016   Procedure: LAPAROSCOPY, LAPAROTOMY  WITH TWO SMALL BOWEL RESECTION;  Surgeon: Johnathan Hausen, MD;  Location: [facility] ORS;  Service: General;  Laterality: N/A;  . ORIF ORBITAL FRACTURE Right 08/15/2010    caught in a hydraulic machine; open reduction internal fixation of orbital rim fracture and open reduction of zygomatic arch fracture  Ar[facility] Endo 10/13/2009  . PORTACATH PLACEMENT Left 04/04/2016   Procedure: INSERTION PORT-A-CATH LEFT CHEST;  Surgeon:  Melrose Nakayama, MD;  Location: [facility];  Service: Thoracic;  Laterality: Left;  Marland Kitchen VIDEO ASSISTED THORACOSCOPY (VATS)/ LOBECTOMY Right 10/18/2015   Procedure: VIDEO ASSISTED THORACOSCOPY (VATS)/ LOBECTOMY;  Surgeon: Melrose Nakayama, MD;  Location: [facility];  Service: Thoracic;  Laterality: Right;  Marland Kitchen VIDEO BRONCHOSCOPY Bilateral 09/21/2015   Procedure: VIDEO BRONCHOSCOPY WITH FLUORO;  Surgeon: Juanito Doom, MD;  Location: [facility] ENDOSCOPY;  Service: Cardiopulmonary;  Laterality: Bilateral;    REVIEW OF SYSTEMS:  A comprehensive review of systems was negative except for: Constitutional: positive for weight loss   PHYSICAL EXAMINATION: General appearance: alert, cooperative and no distress Head: Normocephalic, without obvious abnormality, atraumatic Neck: no adenopathy, no JVD, supple, symmetrical, trachea midline and thyroid not enlarged, symmetric, no tenderness/mass/nodules Lymph nodes: Cervical, supraclavicular, and axillary nodes normal. Resp: clear to auscultation bilaterally Back: symmetric, no curvature. ROM normal. No CVA tenderness. Cardio: regular rate and rhythm, S1, S2 normal, no murmur, click, rub or gallop GI: soft, non-tender; bowel sounds normal; no  masses,  no organomegaly Extremities: extremities normal, atraumatic, no cyanosis or edema  ECOG PERFORMANCE STATUS: 1 - Symptomatic but completely ambulatory  Blood pressure (!) 107/58, pulse 83, temperature 97.9 F (36.6 C), temperature source Oral, resp. rate 18, height 5' 7"  (1.702 m), weight 115 lb 12.8 oz (52.5 kg), SpO2 100 %.  LABORATORY DATA: Lab Results  Component Value Date   WBC 8.1 07/31/2017   HGB 15.3 07/31/2017   HCT 46.1 07/31/2017   MCV 90.1 07/31/2017   PLT 197 07/31/2017      Chemistry      Component Value Date/Time   NA 138 07/31/2017 0950   NA 140 01/23/2017 0910   K 4.4 07/31/2017 0950   K 4.5 01/23/2017 0910   CL 102 07/31/2017 0950   CO2 26 07/31/2017 0950   CO2 28 01/23/2017 0910    BUN 15 07/31/2017 0950   BUN 8.1 01/23/2017 0910   CREATININE 0.86 07/31/2017 0950   CREATININE 0.8 01/23/2017 0910   GLU 398 (H) 08/08/2016 1257      Component Value Date/Time   CALCIUM 10.2 07/31/2017 0950   CALCIUM 9.6 01/23/2017 0910   ALKPHOS 150 (H) 07/31/2017 0950   ALKPHOS 113 01/23/2017 0910   AST 12 (L) 07/31/2017 0950   AST 11 01/23/2017 0910   ALT 13 07/31/2017 0950   ALT 12 01/23/2017 0910   BILITOT 0.4 07/31/2017 0950   BILITOT 0.40 01/23/2017 0910       RADIOGRAPHIC STUDIES: No results found.   ASSESSMENT AND PLAN:  This is a very pleasant 57 years old white male with metastatic non-small cell lung cancer, adenocarcinoma status post right upper lobectomy with lymph node dissection. Unfortunately the patient was found to have metastatic disease in the jejunum and terminal ileum.  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 The patient was started on treatment with systemic chemotherapy with carboplatin and Alimta for 2 cycles discontinued secondary to intolerance and disease progression. The patient is currently undergoing treatment with second line immunotherapy with Ketruda (pembrolizumab) 200 mg IV every 3 weeks, status post 20 cycles. He has been tolerating the treatment well with no concerning complaints. I recommended for the patient to proceed with cycle #21 today as scheduled. I will see him back for follow-up visit in 3 weeks for evaluation before starting the next cycle of his treatment. He was advised to call immediately if he has any concerning symptoms in the interval. The patient voices understanding of current disease status and treatment options and is in agreement with the current care plan. All questions were answered. The patient knows to call the clinic with any problems, questions or concerns. We can certainly see  the patient much sooner if necessary.  Disclaimer: This note was dictated with voice recognition software. Similar sounding words can inadvertently be transcribed and may not be corrected upon review.

## 2017-07-31 NOTE — Telephone Encounter (Signed)
Per 7/11 los patient was unable to stay for infusion due to wait time gap. He was r/s for tomorrow morning at 8:30 am with Threasa Beards) here to assist patient needs also. Patient agreed on the time scheduled. Added another appointment onto his schedule also. He declined avs and calender.

## 2017-08-01 ENCOUNTER — Inpatient Hospital Stay: Payer: PPO

## 2017-08-01 VITALS — BP 101/72 | HR 70 | Temp 98.1°F | Resp 18

## 2017-08-01 DIAGNOSIS — Z5112 Encounter for antineoplastic immunotherapy: Secondary | ICD-10-CM | POA: Diagnosis not present

## 2017-08-01 DIAGNOSIS — C3491 Malignant neoplasm of unspecified part of right bronchus or lung: Secondary | ICD-10-CM

## 2017-08-01 MED ORDER — SODIUM CHLORIDE 0.9 % IV SOLN
Freq: Once | INTRAVENOUS | Status: AC
  Administered 2017-08-01: 11:00:00 via INTRAVENOUS

## 2017-08-01 MED ORDER — HEPARIN SOD (PORK) LOCK FLUSH 100 UNIT/ML IV SOLN
500.0000 [IU] | Freq: Once | INTRAVENOUS | Status: AC | PRN
  Administered 2017-08-01: 500 [IU]
  Filled 2017-08-01: qty 5

## 2017-08-01 MED ORDER — SODIUM CHLORIDE 0.9 % IV SOLN
200.0000 mg | Freq: Once | INTRAVENOUS | Status: AC
  Administered 2017-08-01: 200 mg via INTRAVENOUS
  Filled 2017-08-01: qty 8

## 2017-08-01 MED ORDER — SODIUM CHLORIDE 0.9% FLUSH
10.0000 mL | INTRAVENOUS | Status: DC | PRN
  Administered 2017-08-01: 10 mL
  Filled 2017-08-01: qty 10

## 2017-08-01 NOTE — Patient Instructions (Signed)
[facility] Discharge Instructions for Patients Receiving Chemotherapy  Today you received the following chemotherapy agents:  Keytruda.  To help prevent nausea and vomiting after your treatment, we encourage you to take your nausea medication as directed.   If you develop nausea and vomiting that is not controlled by your nausea medication, call the clinic.   BELOW ARE SYMPTOMS THAT SHOULD BE REPORTED IMMEDIATELY:  *FEVER GREATER THAN 100.5 F  *CHILLS WITH OR WITHOUT FEVER  NAUSEA AND VOMITING THAT IS NOT CONTROLLED WITH YOUR NAUSEA MEDICATION  *UNUSUAL SHORTNESS OF BREATH  *UNUSUAL BRUISING OR BLEEDING  TENDERNESS IN MOUTH AND THROAT WITH OR WITHOUT PRESENCE OF ULCERS  *URINARY PROBLEMS  *BOWEL PROBLEMS  UNUSUAL RASH Items with * indicate a potential emergency and should be followed up as soon as possible.  Feel free to call the clinic should you have any questions or concerns. The clinic phone number is [phone].  Please show the CHEMO ALERT CARD at check-in to the Emergency Department and triage nurse.    

## 2017-08-08 ENCOUNTER — Other Ambulatory Visit: Payer: Self-pay | Admitting: *Deleted

## 2017-08-08 DIAGNOSIS — G894 Chronic pain syndrome: Secondary | ICD-10-CM

## 2017-08-08 MED ORDER — OXYCODONE-ACETAMINOPHEN 10-325 MG PO TABS: ORAL_TABLET | ORAL | 0 refills | Status: DC

## 2017-08-08 NOTE — Telephone Encounter (Signed)
Chronic pain syndrome functionally doing well on percocet 240 per month. Last seen in April, not sure why he didn't follow up in July as asked. I checked database which is appropriate. Dronabinol just added by oncology group. I will refill a 2 month supply and patient will follow up with Dr. Eppie Gibson in September.

## 2017-08-08 NOTE — Telephone Encounter (Signed)
Call from pt's wife-refill due 07/24, but was told to call in advance to prevent delays in getting refill.   Pt currenty taking percocet 10-325mg  1-2 every six hours as needed. #240.  Pt's pcp unavail, will send to attending for consideration.Despina Hidden Cassady7/19/20193:06 PM  Of note, pt will be added to DrKlima's clinic in Sept once schedule becomes available.Despina Hidden Cassady7/19/20193:08 PM

## 2017-08-21 ENCOUNTER — Encounter: Payer: Self-pay | Admitting: Internal Medicine

## 2017-08-21 ENCOUNTER — Inpatient Hospital Stay: Payer: PPO | Attending: Internal Medicine

## 2017-08-21 ENCOUNTER — Inpatient Hospital Stay: Payer: PPO

## 2017-08-21 ENCOUNTER — Inpatient Hospital Stay (HOSPITAL_BASED_OUTPATIENT_CLINIC_OR_DEPARTMENT_OTHER): Payer: PPO | Admitting: Internal Medicine

## 2017-08-21 ENCOUNTER — Other Ambulatory Visit: Payer: Self-pay | Admitting: Internal Medicine

## 2017-08-21 ENCOUNTER — Telehealth: Payer: Self-pay | Admitting: Internal Medicine

## 2017-08-21 VITALS — BP 106/86 | HR 99 | Temp 98.1°F | Resp 20 | Ht 67.0 in | Wt 112.3 lb

## 2017-08-21 DIAGNOSIS — Z5112 Encounter for antineoplastic immunotherapy: Secondary | ICD-10-CM

## 2017-08-21 DIAGNOSIS — Z79899 Other long term (current) drug therapy: Secondary | ICD-10-CM | POA: Insufficient documentation

## 2017-08-21 DIAGNOSIS — C3491 Malignant neoplasm of unspecified part of right bronchus or lung: Secondary | ICD-10-CM

## 2017-08-21 DIAGNOSIS — E119 Type 2 diabetes mellitus without complications: Secondary | ICD-10-CM | POA: Insufficient documentation

## 2017-08-21 DIAGNOSIS — C3411 Malignant neoplasm of upper lobe, right bronchus or lung: Secondary | ICD-10-CM

## 2017-08-21 DIAGNOSIS — Z7984 Long term (current) use of oral hypoglycemic drugs: Secondary | ICD-10-CM | POA: Diagnosis not present

## 2017-08-21 DIAGNOSIS — R5383 Other fatigue: Secondary | ICD-10-CM | POA: Diagnosis not present

## 2017-08-21 DIAGNOSIS — C784 Secondary malignant neoplasm of small intestine: Secondary | ICD-10-CM | POA: Insufficient documentation

## 2017-08-21 DIAGNOSIS — R634 Abnormal weight loss: Secondary | ICD-10-CM | POA: Diagnosis not present

## 2017-08-21 DIAGNOSIS — C171 Malignant neoplasm of jejunum: Secondary | ICD-10-CM

## 2017-08-21 DIAGNOSIS — C3490 Malignant neoplasm of unspecified part of unspecified bronchus or lung: Secondary | ICD-10-CM

## 2017-08-21 LAB — TSH: TSH: 2.326 u[IU]/mL (ref 0.320–4.118)

## 2017-08-21 LAB — CMP (CANCER CENTER ONLY)
ALT: 13 U/L (ref 0–44)
AST: 14 U/L — ABNORMAL LOW (ref 15–41)
Albumin: 4.2 g/dL (ref 3.5–5.0)
Alkaline Phosphatase: 151 U/L — ABNORMAL HIGH (ref 38–126)
Anion gap: 11 (ref 5–15)
BUN: 16 mg/dL (ref 6–20)
CO2: 27 mmol/L (ref 22–32)
Calcium: 10.6 mg/dL — ABNORMAL HIGH (ref 8.9–10.3)
Chloride: 102 mmol/L (ref 98–111)
Creatinine: 0.84 mg/dL (ref 0.61–1.24)
GFR, Est AFR Am: >60 mL/min (ref >60)
GFR, Estimated: >60 mL/min (ref >60)
Glucose, Bld: 229 mg/dL — ABNORMAL HIGH (ref 70–99)
Potassium: 4.2 mmol/L (ref 3.5–5.1)
Sodium: 140 mmol/L (ref 135–145)
Total Bilirubin: 0.4 mg/dL (ref 0.3–1.2)
Total Protein: 8 g/dL (ref 6.5–8.1)

## 2017-08-21 LAB — CBC WITH DIFFERENTIAL (CANCER CENTER ONLY)
Basophils Absolute: 0 10*3/uL (ref 0.0–0.1)
Basophils Relative: 1 %
Eosinophils Absolute: 0.7 10*3/uL — ABNORMAL HIGH (ref 0.0–0.5)
Eosinophils Relative: 9 %
HCT: 46 % (ref 38.4–49.9)
Hemoglobin: 15.5 g/dL (ref 13.0–17.1)
Lymphocytes Relative: 18 %
Lymphs Abs: 1.5 10*3/uL (ref 0.9–3.3)
MCH: 30.6 pg (ref 27.2–33.4)
MCHC: 33.7 g/dL (ref 32.0–36.0)
MCV: 90.9 fL (ref 79.3–98.0)
Monocytes Absolute: 0.5 10*3/uL (ref 0.1–0.9)
Monocytes Relative: 6 %
Neutro Abs: 5.3 10*3/uL (ref 1.5–6.5)
Neutrophils Relative %: 66 %
Platelet Count: 225 10*3/uL (ref 140–400)
RBC: 5.06 MIL/uL (ref 4.20–5.82)
RDW: 14.4 % (ref 11.0–14.6)
WBC Count: 8 10*3/uL (ref 4.0–10.3)

## 2017-08-21 MED ORDER — SODIUM CHLORIDE 0.9 % IV SOLN
Freq: Once | INTRAVENOUS | Status: AC
  Administered 2017-08-21: 10:00:00 via INTRAVENOUS
  Filled 2017-08-21: qty 250

## 2017-08-21 MED ORDER — SODIUM CHLORIDE 0.9 % IV SOLN
200.0000 mg | Freq: Once | INTRAVENOUS | Status: AC
  Administered 2017-08-21: 200 mg via INTRAVENOUS
  Filled 2017-08-21: qty 8

## 2017-08-21 MED ORDER — SODIUM CHLORIDE 0.9% FLUSH
10.0000 mL | INTRAVENOUS | Status: DC | PRN
  Administered 2017-08-21: 10 mL
  Filled 2017-08-21: qty 10

## 2017-08-21 MED ORDER — HEPARIN SOD (PORK) LOCK FLUSH 100 UNIT/ML IV SOLN
500.0000 [IU] | Freq: Once | INTRAVENOUS | Status: AC | PRN
  Administered 2017-08-21: 500 [IU]
  Filled 2017-08-21: qty 5

## 2017-08-21 NOTE — Telephone Encounter (Signed)
Scheduled appt per 8/1 los - pt to get an updated schedule in treatment area.

## 2017-08-21 NOTE — Patient Instructions (Signed)
[facility] Discharge Instructions for Patients Receiving Chemotherapy  Today you received the following chemotherapy agents:  Keytruda.  To help prevent nausea and vomiting after your treatment, we encourage you to take your nausea medication as directed.   If you develop nausea and vomiting that is not controlled by your nausea medication, call the clinic.   BELOW ARE SYMPTOMS THAT SHOULD BE REPORTED IMMEDIATELY:  *FEVER GREATER THAN 100.5 F  *CHILLS WITH OR WITHOUT FEVER  NAUSEA AND VOMITING THAT IS NOT CONTROLLED WITH YOUR NAUSEA MEDICATION  *UNUSUAL SHORTNESS OF BREATH  *UNUSUAL BRUISING OR BLEEDING  TENDERNESS IN MOUTH AND THROAT WITH OR WITHOUT PRESENCE OF ULCERS  *URINARY PROBLEMS  *BOWEL PROBLEMS  UNUSUAL RASH Items with * indicate a potential emergency and should be followed up as soon as possible.  Feel free to call the clinic should you have any questions or concerns. The clinic phone number is [phone].  Please show the CHEMO ALERT CARD at check-in to the Emergency Department and triage nurse.    

## 2017-08-21 NOTE — Progress Notes (Signed)
[place] [phone]) [phone]   Fax:([phone]) [phone]  OFFICE PROGRESS NOTE  [name], MD [address] [place] [address]  DIAGNOSIS: Stage IV (T1b, N0, M1b) non-small cell lung cancer, adenocarcinoma presented with right upper lobe lung nodule and recent metastasis to the small intestine. This was initially diagnosed in September 2017.  Genomic Alterations Identified? ERBB2 amplification - equivocal? CDKN2A p16INK4a E88* and p14ARF R975O SMARCA4 splice site [id number]>RA SPTA1 E2022* TOP2A amplification TP53 A159P Additional Findings? Microsatellite status MS-Stable Tumor Mutation Burden TMB-Intermediate; 18 Muts/Mb Additional Disease-relevant Genes with No Reportable Alterations Identified? EGFR KRAS ALK BRAF MET RET ROS1   PRIOR THERAPY:  1) Status post right VATS with right upper lobectomy and mediastinal lymph node dissection under the care of Dr. Roxan Hockey on 10/18/2015 and the final pathology was consistent with stage IA (T1b, N0, MX). 2) upper endoscopy on 01/05/2016 showed normal esophagus, normal stomach but there was occasional mass around 3.0 CM in length circumferential nonobstructing in the jejunum. The final pathology was consistent with metastatic adenocarcinoma. 3) status post laparoscopic laparotomy and resection of proximal lesion and and distal jejunum/proximal ileum under the care of Dr. Hassell Done 1 02/27/2016. 3)  Systemic chemotherapy with carboplatin for AUC of 5 and Alimta 500 MG/M2 every 3 weeks. First dose 04/04/2016. Status post 2 cycles. Last dose was given 04/21/2016 discontinued secondary to disease progression.   CURRENT THERAPY: Second line immunotherapy with Ketruda 200 mg IV every 2 weeks, first dose 05/30/2016. Status post 21 cycles.  INTERVAL HISTORY: Brett [name] 57 y.o. male returns to the clinic today for follow-up visit accompanied by his wife.  The patient is feeling fine today with no specific  complaints except for intermittent fatigue.  He denied having any chest pain, shortness of breath, cough or hemoptysis.  He denied having any fever or chills.  He has no nausea, vomiting, diarrhea or constipation.  He denied having any skin rash.  He continues to tolerate his treatment with Keytruda fairly well.  He is here for evaluation before starting cycle #22.  MEDICAL HISTORY: Past Medical History:  Diagnosis Date  . Anemia 11/28/2015  . Anxiety   . Arthritis    "hands, back" (11/28/2015)  . Barrett's esophagus 07/01/2013   Without dysplasia on biopsy 09/03/2012. Repeat EGD recommended 08/2015  . Bilateral cataracts 02/13/2017  . Carotid artery stenosis 07/01/2013   Requiring right sided stent   . Chronic pain syndrome 07/01/2013  . Closed head injury with brief loss of consciousness ([place]) 07/22/2010   Head trapped in a hydraulic device at work.  Fracture of orbital bones on right and brief loss of consciousness per report.  . 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 There was likely a strong psycho-social overlay in regard to the cognitive deficits in the form of mood disorder with psychotic features and mixed anxiety symptomatology. His primary tested cognitive deficits are in the areas of attention, executi  . Daily headache "since 07/2010"   constantly  . Degenerative joint disease of cervical spine 07/01/2013  . Dehydration 04/11/2016  . Dupuytren's contracture of both hands 04/08/2014  . Encounter for antineoplastic chemotherapy 10/05/2015  . Encounter for antineoplastic immunotherapy 05/24/2016  . Erectile dysfunction associated with type 2 diabetes mellitus ([place]) 07/01/2013  . Fibromyalgia 07/01/2013  . Goals of care, counseling/discussion 03/28/2016  . History of blood transfusion 11/28/2015   "  suppose to get his first today" (11/28/2015)  .  Hyperlipidemia LDL goal < 100 07/01/2013  . Intractable hiccups 04/11/2016  . 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 Memory changes    "memory issues" from head injury  . Moderate protein-calorie malnutrition ([place]) 11/29/2015  . Osteoarthritis of right thumb 10/21/2014  . Peripheral vascular occlusive disease ([place]) 07/01/2013   Requiring 2 arterial stents above the left knee per report  . Pneumonia ~ 2006/2007  . Post traumatic stress disorder 07/01/2013  . Primary lung adenocarcinoma ([place]) dx'd 08/2015   "right lung"  . Severe major depression with psychotic features ([place]) 04/15/2011  . Tobacco abuse 07/01/2013  . Tobacco abuse   . Type 2 diabetes mellitus with vascular disease ([place]) 07/01/2013   Left lower extremity and right carotid stenting    ALLERGIES:  is allergic to gabapentin; lyrica [pregabalin]; celebrex [celecoxib]; and contrast media [iodinated diagnostic agents].  MEDICATIONS:  Current Outpatient Medications  Medication Sig Dispense Refill  . aspirin EC 81 MG tablet Take 81 mg by mouth daily.     Marland Kitchen atorvastatin (LIPITOR) 40 MG tablet Take 1 tablet (40 mg total) by mouth daily. 90 tablet 3  . cyclobenzaprine (FLEXERIL) 10 MG tablet Take 1 tablet (10 mg total) by mouth 3 (three) times daily as needed for muscle spasms. 90 tablet 11  . diphenhydrAMINE (BENADRYL) 25 mg capsule Take 2 capsules (50 mg total) by mouth as directed. Take prior to CT scan as directed 30 capsule 0  . dronabinol (MARINOL) 2.5 MG capsule Take 1 capsule (2.5 mg total) by mouth 2 (two) times daily before a meal. 60 capsule 0  . empagliflozin (JARDIANCE) 10 MG TABS tablet Take 10 mg by mouth daily. 90 tablet 3  . erythromycin ophthalmic ointment Place 1 application into both eyes 2 (two) times daily.  1  . glipiZIDE (GLUCOTROL) 10 MG tablet Take 2 tablets (20 mg total) by mouth 2 (two) times daily before a meal. 360 tablet 3  . hydrOXYzine (ATARAX/VISTARIL) 10 MG tablet Take 1 tablet (10 mg total) by mouth 3  (three) times daily as needed for itching. 30 tablet 0  . lidocaine-prilocaine (EMLA) cream Apply 1 application topically as needed. 30 g 0  . lisinopril (PRINIVIL,ZESTRIL) 5 MG tablet Take 0.5 tablets (2.5 mg total) by mouth daily. 90 tablet 3  . metFORMIN (GLUCOPHAGE-XR) 500 MG 24 hr tablet Take 1 tablet (500 mg total) by mouth 2 (two) times daily. 180 tablet 3  . metoCLOPramide (REGLAN) 10 MG tablet TAKE 1 TABLET BY MOUTH FOUR TIMES DAILY AS NEEDED AS DIRECTED 30 tablet 0  . ondansetron (ZOFRAN) 4 MG tablet Take 1 tablet (4 mg total) by mouth 3 (three) times daily. 20 tablet 0  . oxyCODONE-acetaminophen (PERCOCET) 10-325 MG tablet 1-2 tablets every 6 hours as needed for moderate pain 240 tablet 0  . pantoprazole (PROTONIX) 40 MG tablet Take 1 tablet (40 mg total) by mouth daily. 90 tablet 3  . predniSONE (DELTASONE) 50 MG tablet Take 1 tablet (50 mg total) by mouth as directed. Take 50 mg 13 hours. 7 hours and 1 hour prior to scan 3 tablet 2  . prochlorperazine (COMPAZINE) 10 MG tablet Take 10 mg by mouth every 6 (six) hours as needed for nausea or vomiting.    . prochlorperazine (COMPAZINE) 25 MG suppository Place 1 suppository (25 mg total) rectally every 12 (twelve) hours as needed for refractory nausea / vomiting. 12 suppository 11  . PROLENSA 0.07 % SOLN Place 1  drop into both eyes 2 (two) times daily.  0  . senna-docusate (SENOKOT-S) 8.6-50 MG tablet Take 1-2 tablets by mouth 2 (two) times daily as needed for mild constipation or moderate constipation. 30 tablet 1  . sitaGLIPtin (JANUVIA) 100 MG tablet Take 1 tablet (100 mg total) by mouth daily. 90 tablet 3  . sorbitol 70 % solution Take 15 cc every 4 hours until bowel movement 473 mL 0   Current Facility-Administered Medications  Medication Dose Route Frequency Provider Last Rate Last Dose  . cyanocobalamin ((VITAMIN B-12)) injection 1,000 mcg  1,000 mcg Intramuscular Q30 days [name], MD   1,000 mcg at 06/18/17 1425     SURGICAL HISTORY:  Past Surgical History:  Procedure Laterality Date  . CAROTID STENT Right ?2014  . COLONOSCOPY N/A 11/30/2015   Procedure: COLONOSCOPY;  Surgeon: Teena Irani, MD;  Location[facility] ENDOSCOPY;  Service: Endoscopy;  Laterality: N/A;  . ESOPHAGOGASTRODUODENOSCOPY N/A 11/30/2015   Procedure: ESOPHAGOGASTRODUODENOSCOPY (EGD);  Surgeon: Teena Irani, MD;  Location: [facility] ENDOSCOPY;  Service: Endoscopy;  Laterality: N/A;  . ESOPHAGOGASTRODUODENOSCOPY (EGD) WITH PROPOFOL N/A 01/05/2016   Procedure: ESOPHAGOGASTRODUODENOSCOPY (EGD) WITH PROPOFOL;  Surgeon: Teena Irani, MD;  Location: [facility] ENDOSCOPY;  Service: Endoscopy;  Laterality: N/A;  . FEMORAL ARTERY STENT Left 05/2012; ~ 2015   Ar[facility] Endo 06/04/2012; Raechel Chute report  . FRACTURE SURGERY    . GIVENS CAPSULE STUDY N/A 12/22/2015   Procedure: GIVENS CAPSULE STUDY;  Surgeon: Wonda Horner, MD;  Location: [facility] ENDOSCOPY;  Service: Endoscopy;  Laterality: N/A;  . HARDWARE REMOVAL Right 11/15/2011   Removal of deep frontozygomatic orbital hardware/notes 11/15/2011  . HERNIA REPAIR  1941   Umbilical  . LAPAROSCOPY N/A 02/27/2016   Procedure: LAPAROSCOPY, LAPAROTOMY  WITH TWO SMALL BOWEL RESECTION;  Surgeon: Johnathan Hausen, MD;  Location: [facility] ORS;  Service: General;  Laterality: N/A;  . ORIF ORBITAL FRACTURE Right 08/15/2010    caught in a hydraulic machine; open reduction internal fixation of orbital rim fracture and open reduction of zygomatic arch fracture  Archie Endo 10/13/2009  . PORTACATH PLACEMENT Left 04/04/2016   Procedure: INSERTION PORT-A-CATH LEFT CHEST;  Surgeon: Melrose Nakayama, MD;  Location: [place];  Service: Thoracic;  Laterality: Left;  Marland Kitchen VIDEO ASSISTED THORACOSCOPY (VATS)/ LOBECTOMY Right 10/18/2015   Procedure: VIDEO ASSISTED THORACOSCOPY (VATS)/ LOBECTOMY;  Surgeon: Melrose Nakayama, MD;  Location: [place];  Service: Thoracic;  Laterality: Right;  Marland Kitchen VIDEO BRONCHOSCOPY Bilateral 09/21/2015   Procedure: VIDEO BRONCHOSCOPY WITH FLUORO;   Surgeon: Juanito Doom, MD;  Location: [facility] ENDOSCOPY;  Service: Cardiopulmonary;  Laterality: Bilateral;    REVIEW OF SYSTEMS:  A comprehensive review of systems was negative.   PHYSICAL EXAMINATION: General appearance: alert, cooperative and no distress Head: Normocephalic, without obvious abnormality, atraumatic Neck: no adenopathy, no JVD, supple, symmetrical, trachea midline and thyroid not enlarged, symmetric, no tenderness/mass/nodules Lymph nodes: Cervical, supraclavicular, and axillary nodes normal. Resp: clear to auscultation bilaterally Back: symmetric, no curvature. ROM normal. No CVA tenderness. Cardio: regular rate and rhythm, S1, S2 normal, no murmur, click, rub or gallop GI: soft, non-tender; bowel sounds normal; no masses,  no organomegaly Extremities: extremities normal, atraumatic, no cyanosis or edema  ECOG PERFORMANCE STATUS: 1 - Symptomatic but completely ambulatory  Blood pressure 106/86, pulse 99, temperature 98.1 F (36.7 C), temperature source Oral, resp. rate 20, height _0  (1.702 m), weight 112 lb 4.8 oz (50.9 kg), SpO2 100 %.  LABORATORY DATA: Lab Results  Component Value Date   WBC 8.0 08/21/2017  HGB 15.5 08/21/2017   HCT 46.0 08/21/2017   MCV 90.9 08/21/2017   PLT 225 08/21/2017      Chemistry      Component Value Date/Time   NA 138 07/31/2017 0950   NA 140 01/23/2017 0910   K 4.4 07/31/2017 0950   K 4.5 01/23/2017 0910   CL 102 07/31/2017 0950   CO2 26 07/31/2017 0950   CO2 28 01/23/2017 0910   BUN 15 07/31/2017 0950   BUN 8.1 01/23/2017 0910   CREATININE 0.86 07/31/2017 0950   CREATININE 0.8 01/23/2017 0910   GLU 398 (H) 08/08/2016 1257      Component Value Date/Time   CALCIUM 10.2 07/31/2017 0950   CALCIUM 9.6 01/23/2017 0910   ALKPHOS 150 (H) 07/31/2017 0950   ALKPHOS 113 01/23/2017 0910   AST 12 (L) 07/31/2017 0950   AST 11 01/23/2017 0910   ALT 13 07/31/2017 0950   ALT 12 01/23/2017 0910   BILITOT 0.4 07/31/2017 0950    BILITOT 0.40 01/23/2017 0910       RADIOGRAPHIC STUDIES: No results found.   ASSESSMENT AND PLAN:  This is a very pleasant 57 years old white male with metastatic non-small cell lung cancer, adenocarcinoma status post right upper lobectomy with lymph node dissection. Unfortunately the patient was found to have metastatic disease in the jejunum and terminal ileum.  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 The patient was started on treatment with systemic chemotherapy with carboplatin and Alimta for 2 cycles discontinued secondary to intolerance and disease progression. The patient is currently undergoing treatment with second line immunotherapy with Ketruda (pembrolizumab) 200 mg IV every 3 weeks, status post 21 cycles. He continues to tolerate this treatment well with no concerning complaints. I recommended for the patient to proceed with cycle #22 today as scheduled. I will see him back for follow-up visit in 3 weeks for evaluation after repeating CT scan of the chest, abdomen and pelvis for restaging of his disease. The patient was advised to call immediately if he has any concerning symptoms in the interval. The patient voices understanding of current disease status and treatment options and is in agreement with the current care plan. All questions were answered. The patient knows to call the clinic with any problems, questions or concerns. We can certainly see the patient much sooner if necessary.  Disclaimer: This note was dictated with voice recognition software. Similar sounding words can inadvertently be transcribed and may not be corrected upon review.

## 2017-08-26 IMAGING — CR DG CHEST 2V
2 series · 2 of 2 positions shown · non-contrast
Comparison: Portable chest x-ray October 23, 2015

CLINICAL DATA: Status post chest tube removal with subsequent
reaccumulation of pneumothorax

EXAM:
CHEST  2 VIEW

[chest pa]
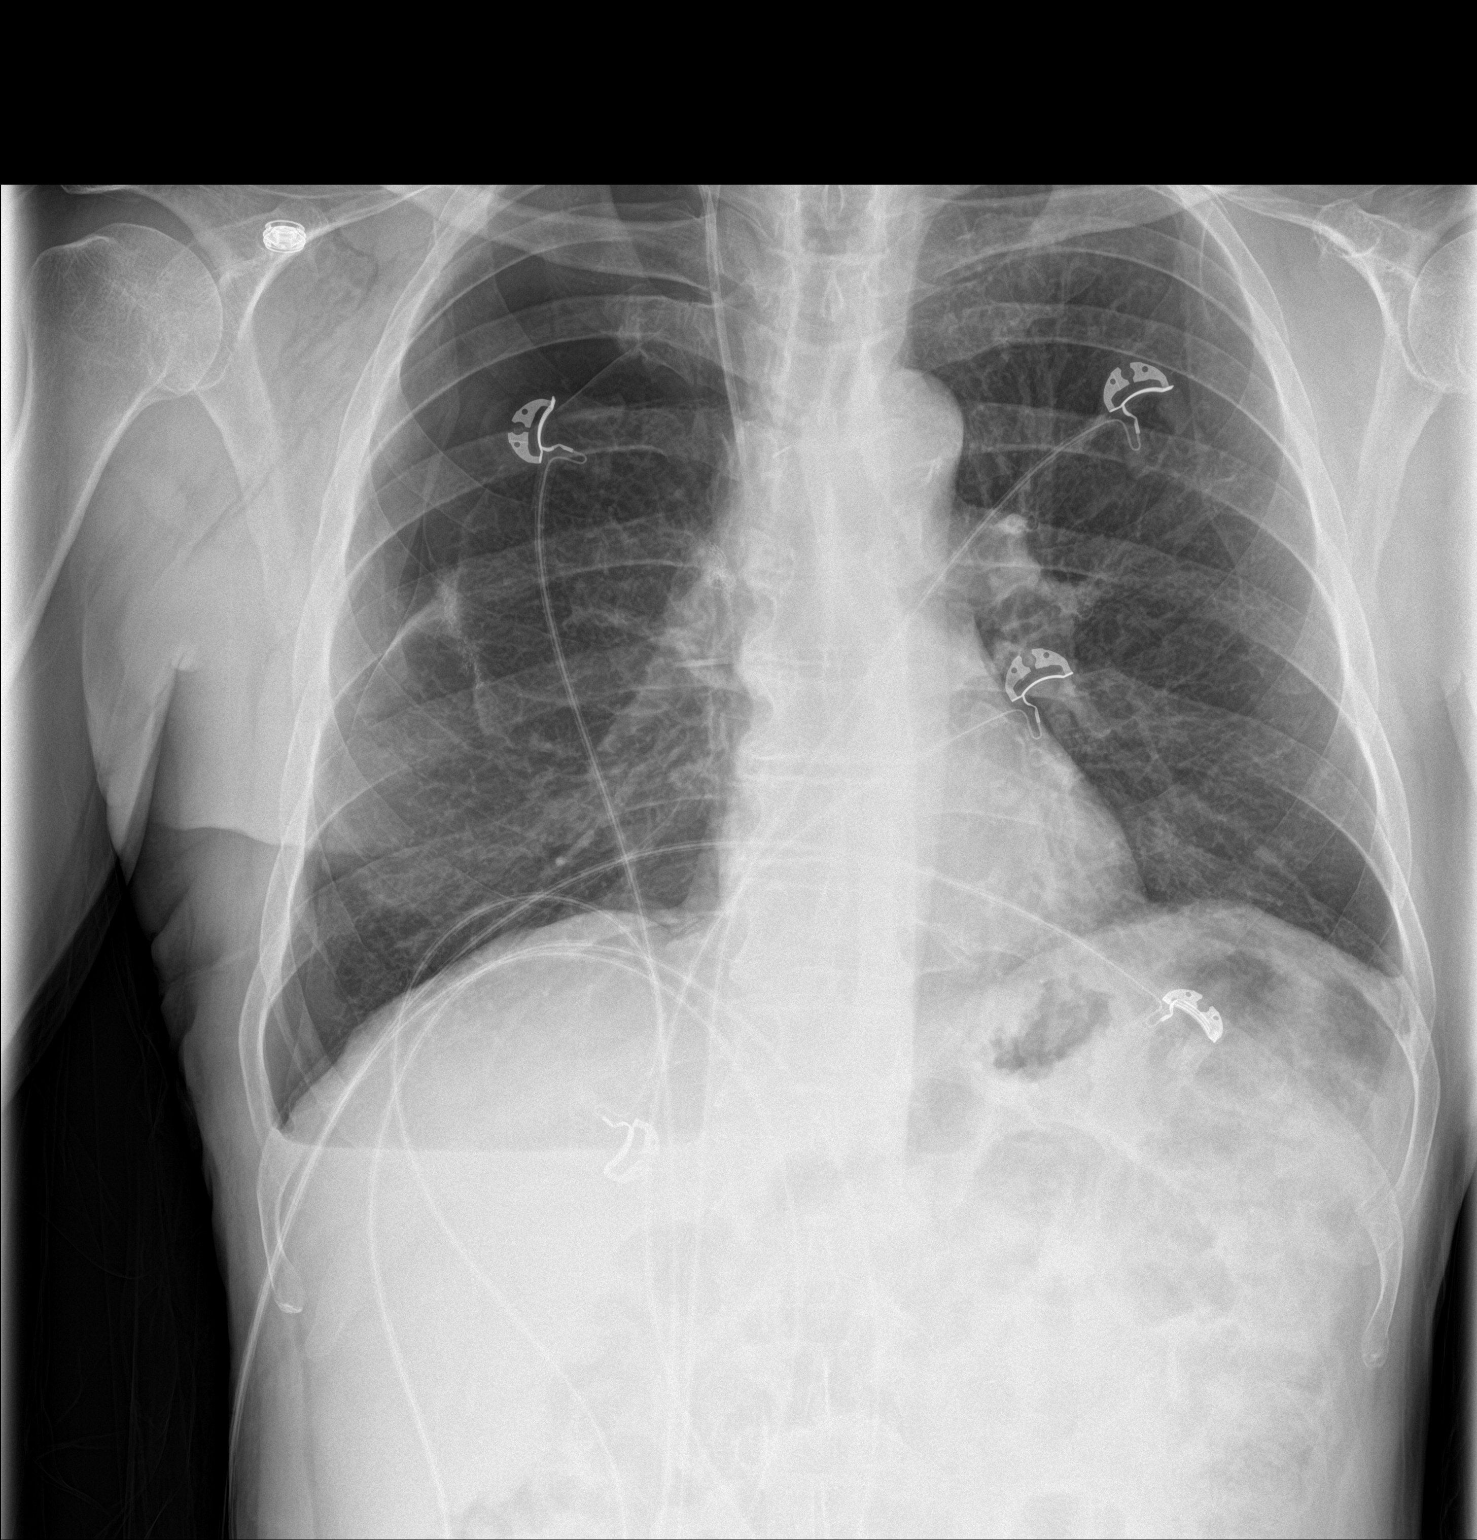

[chest lat]
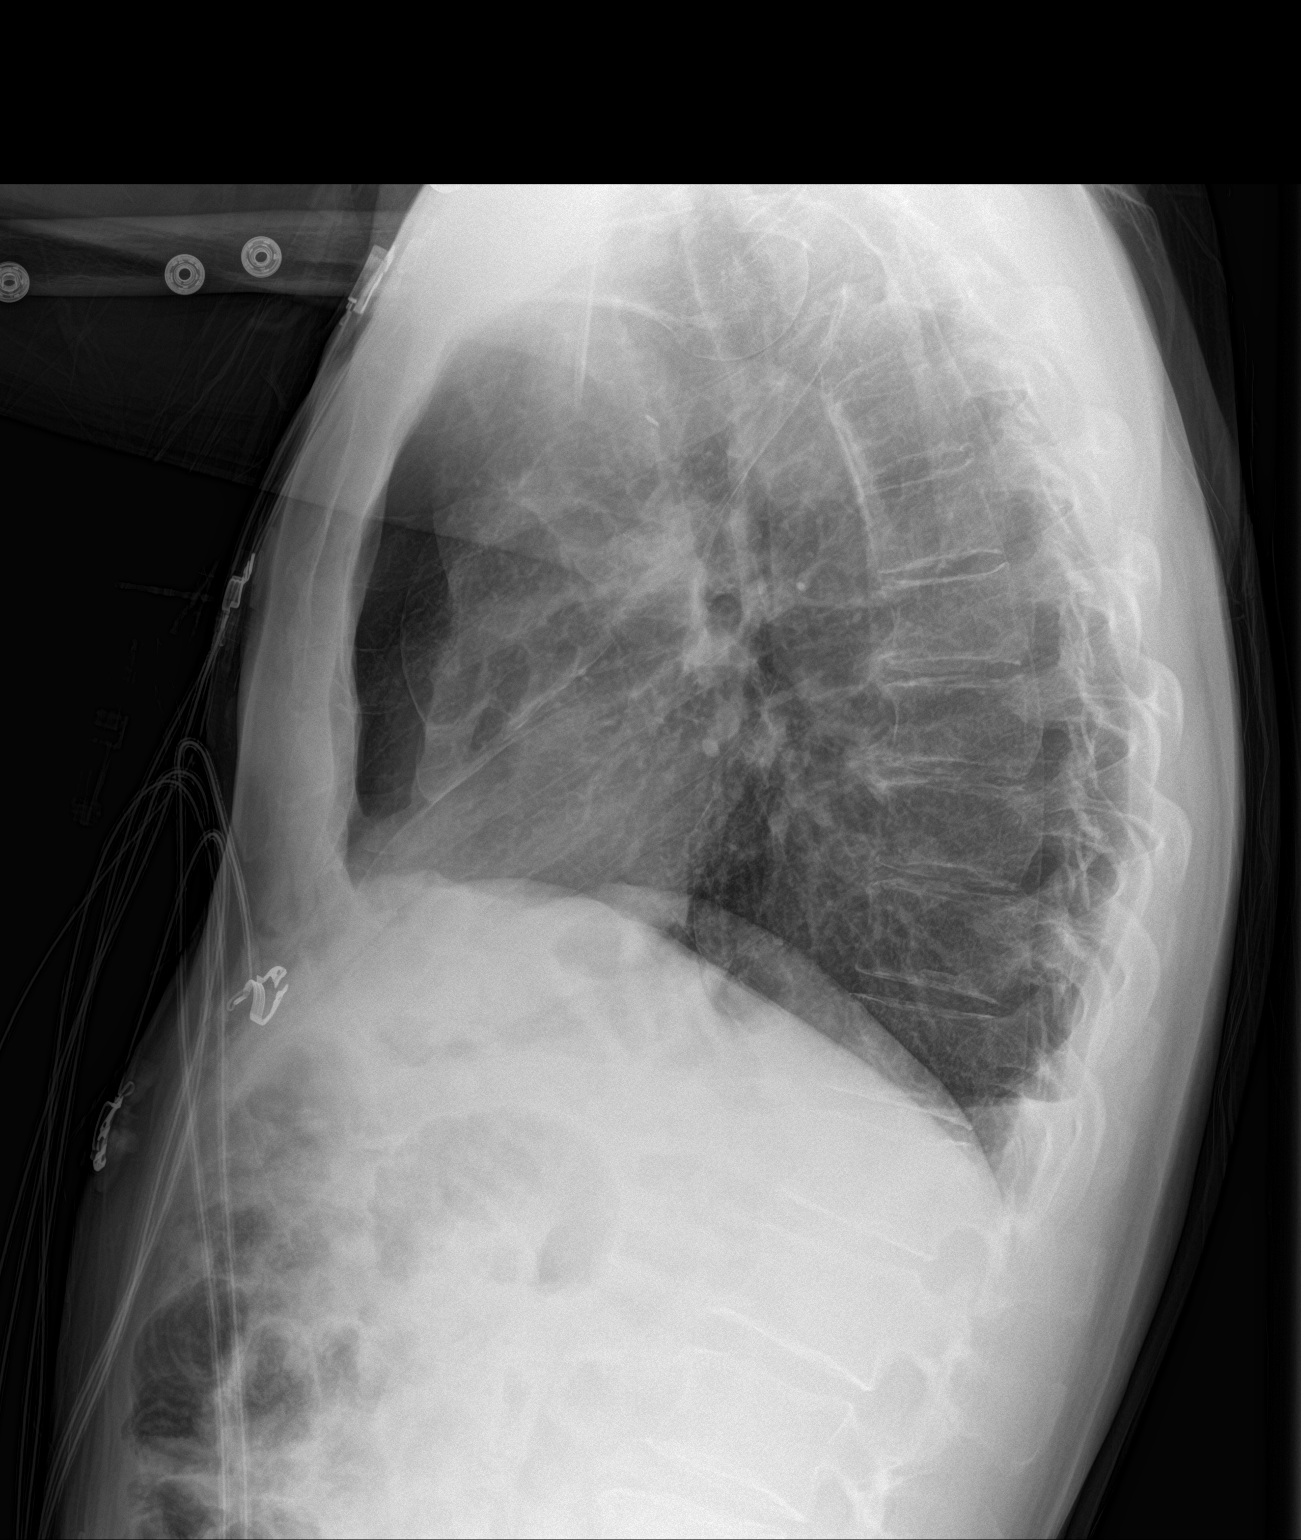

[2 of 2 positions shown; findings below may reference images not displayed]

FINDINGS: The right-sided hydropneumothorax has increased and occupies at
least 40% of the lung volume. There is no mediastinal shift. The
left lung is well-expanded. The heart and pulmonary vascularity are
normal. There is calcification in the wall of the aortic arch. The
right internal jugular venous catheter tip projects over the
junction of the proximal and mid portions of the SVC.
IMPRESSION: Interval increase in the volume of the right-sided hydropneumothorax
which now occupies 40% of the lung volume.

These results were called by telephone at the time of interpretation
on 10/24/2015 at [DATE] to Jonte Chu, RN, who verbally
acknowledged these results.

## 2017-09-09 ENCOUNTER — Encounter (HOSPITAL_COMMUNITY): Payer: Self-pay

## 2017-09-09 ENCOUNTER — Ambulatory Visit (HOSPITAL_COMMUNITY)
Admission: RE | Admit: 2017-09-09 | Discharge: 2017-09-09 | Disposition: A | Discharge status: Home / Self Care | Payer: PPO | Source: Ambulatory Visit | Attending: Internal Medicine | Admitting: Internal Medicine

## 2017-09-09 DIAGNOSIS — C3490 Malignant neoplasm of unspecified part of unspecified bronchus or lung: Secondary | ICD-10-CM | POA: Diagnosis not present

## 2017-09-09 DIAGNOSIS — K5900 Constipation, unspecified: Secondary | ICD-10-CM | POA: Insufficient documentation

## 2017-09-09 DIAGNOSIS — I2510 Atherosclerotic heart disease of native coronary artery without angina pectoris: Secondary | ICD-10-CM | POA: Insufficient documentation

## 2017-09-09 DIAGNOSIS — M5136 Other intervertebral disc degeneration, lumbar region: Secondary | ICD-10-CM | POA: Insufficient documentation

## 2017-09-09 DIAGNOSIS — C784 Secondary malignant neoplasm of small intestine: Secondary | ICD-10-CM | POA: Diagnosis not present

## 2017-09-09 DIAGNOSIS — I700 Atherosclerosis of aorta: Secondary | ICD-10-CM | POA: Insufficient documentation

## 2017-09-09 MED ORDER — IOHEXOL 300 MG/ML  SOLN
100.0000 mL | Freq: Once | INTRAMUSCULAR | Status: AC | PRN
  Administered 2017-09-09: 80 mL via INTRAVENOUS

## 2017-09-11 ENCOUNTER — Telehealth: Payer: Self-pay | Admitting: Internal Medicine

## 2017-09-11 ENCOUNTER — Other Ambulatory Visit: Payer: Self-pay | Admitting: Medical Oncology

## 2017-09-11 ENCOUNTER — Inpatient Hospital Stay: Payer: PPO

## 2017-09-11 ENCOUNTER — Encounter: Payer: Self-pay | Admitting: Internal Medicine

## 2017-09-11 ENCOUNTER — Inpatient Hospital Stay (HOSPITAL_BASED_OUTPATIENT_CLINIC_OR_DEPARTMENT_OTHER): Payer: PPO | Admitting: Internal Medicine

## 2017-09-11 VITALS — BP 103/72 | HR 71 | Temp 98.8°F | Resp 18 | Ht 67.0 in | Wt 110.6 lb

## 2017-09-11 DIAGNOSIS — E1159 Type 2 diabetes mellitus with other circulatory complications: Secondary | ICD-10-CM

## 2017-09-11 DIAGNOSIS — E119 Type 2 diabetes mellitus without complications: Secondary | ICD-10-CM

## 2017-09-11 DIAGNOSIS — C3411 Malignant neoplasm of upper lobe, right bronchus or lung: Secondary | ICD-10-CM | POA: Diagnosis not present

## 2017-09-11 DIAGNOSIS — C3491 Malignant neoplasm of unspecified part of right bronchus or lung: Secondary | ICD-10-CM

## 2017-09-11 DIAGNOSIS — R634 Abnormal weight loss: Secondary | ICD-10-CM

## 2017-09-11 DIAGNOSIS — Z7984 Long term (current) use of oral hypoglycemic drugs: Secondary | ICD-10-CM

## 2017-09-11 DIAGNOSIS — R739 Hyperglycemia, unspecified: Secondary | ICD-10-CM

## 2017-09-11 DIAGNOSIS — Z5112 Encounter for antineoplastic immunotherapy: Secondary | ICD-10-CM

## 2017-09-11 LAB — CBC WITH DIFFERENTIAL (CANCER CENTER ONLY)
Basophils Absolute: 0.1 10*3/uL (ref 0.0–0.1)
Basophils Relative: 1 %
Eosinophils Absolute: 0.4 10*3/uL (ref 0.0–0.5)
Eosinophils Relative: 5 %
HCT: 44.2 % (ref 38.4–49.9)
Hemoglobin: 14.9 g/dL (ref 13.0–17.1)
Lymphocytes Relative: 22 %
Lymphs Abs: 1.6 10*3/uL (ref 0.9–3.3)
MCH: 30.4 pg (ref 27.2–33.4)
MCHC: 33.7 g/dL (ref 32.0–36.0)
MCV: 90.2 fL (ref 79.3–98.0)
Monocytes Absolute: 0.4 10*3/uL (ref 0.1–0.9)
Monocytes Relative: 6 %
Neutro Abs: 4.8 10*3/uL (ref 1.5–6.5)
Neutrophils Relative %: 66 %
Platelet Count: 211 10*3/uL (ref 140–400)
RBC: 4.9 MIL/uL (ref 4.20–5.82)
RDW: 14.2 % (ref 11.0–14.6)
WBC Count: 7.2 10*3/uL (ref 4.0–10.3)

## 2017-09-11 LAB — CMP (CANCER CENTER ONLY)
ALT: 12 U/L (ref 0–44)
AST: 9 U/L — ABNORMAL LOW (ref 15–41)
Albumin: 4.1 g/dL (ref 3.5–5.0)
Alkaline Phosphatase: 133 U/L — ABNORMAL HIGH (ref 38–126)
Anion gap: 10 (ref 5–15)
BUN: 14 mg/dL (ref 6–20)
CO2: 27 mmol/L (ref 22–32)
Calcium: 10 mg/dL (ref 8.9–10.3)
Chloride: 99 mmol/L (ref 98–111)
Creatinine: 1 mg/dL (ref 0.61–1.24)
GFR, Est AFR Am: >60 mL/min (ref >60)
GFR, Estimated: >60 mL/min (ref >60)
Glucose, Bld: 488 mg/dL — ABNORMAL HIGH (ref 70–99)
Potassium: 4.5 mmol/L (ref 3.5–5.1)
Sodium: 136 mmol/L (ref 135–145)
Total Bilirubin: 0.3 mg/dL (ref 0.3–1.2)
Total Protein: 7.4 g/dL (ref 6.5–8.1)

## 2017-09-11 LAB — TSH: TSH: 2.85 u[IU]/mL (ref 0.320–4.118)

## 2017-09-11 MED ORDER — SODIUM CHLORIDE 0.9% FLUSH
10.0000 mL | INTRAVENOUS | Status: DC | PRN
  Administered 2017-09-11: 10 mL
  Filled 2017-09-11: qty 10

## 2017-09-11 MED ORDER — SODIUM CHLORIDE 0.9 % IV SOLN
Freq: Once | INTRAVENOUS | Status: AC
  Administered 2017-09-11: 13:00:00 via INTRAVENOUS
  Filled 2017-09-11: qty 250

## 2017-09-11 MED ORDER — HEPARIN SOD (PORK) LOCK FLUSH 100 UNIT/ML IV SOLN
500.0000 [IU] | Freq: Once | INTRAVENOUS | Status: AC | PRN
  Administered 2017-09-11: 500 [IU]
  Filled 2017-09-11: qty 5

## 2017-09-11 MED ORDER — INSULIN REGULAR HUMAN 100 UNIT/ML IJ SOLN
10.0000 [IU] | Freq: Once | INTRAMUSCULAR | Status: AC
  Administered 2017-09-11: 10 [IU] via SUBCUTANEOUS
  Filled 2017-09-11: qty 0.1

## 2017-09-11 MED ORDER — SODIUM CHLORIDE 0.9 % IV SOLN
200.0000 mg | Freq: Once | INTRAVENOUS | Status: AC
  Administered 2017-09-11: 200 mg via INTRAVENOUS
  Filled 2017-09-11: qty 8

## 2017-09-11 NOTE — Telephone Encounter (Signed)
s scheduled AVS/Calendar printed per 8/22 los

## 2017-09-11 NOTE — Progress Notes (Signed)
[facility] [phone]   [phone]  OFFICE PROGRESS NOTE  [name], MD [address] [place] [address]  DIAGNOSIS: Stage IV (T1b, N0, M1b) non-small cell lung cancer, adenocarcinoma presented with right upper lobe lung nodule and recent metastasis to the small intestine. This was initially diagnosed in September 2017.  Genomic Alterations Identified? ERBB2 amplification - equivocal? CDKN2A p16INK4a E88* and p14ARF W119J SMARCA4 splice site [id number] SPTA1 E2022* TOP2A amplification TP53 A159P Additional Findings? Microsatellite status MS-Stable Tumor Mutation Burden TMB-Intermediate; 18 Muts/Mb Additional Disease-relevant Genes with No Reportable Alterations Identified? EGFR KRAS ALK BRAF MET RET ROS1   PRIOR THERAPY:  1) Status post right VATS with right upper lobectomy and mediastinal lymph node dissection under the care of Dr. Roxan Hockey on 10/18/2015 and the final pathology was consistent with stage IA (T1b, N0, MX). 2) upper endoscopy on 01/05/2016 showed normal esophagus, normal stomach but there was occasional mass around 3.0 CM in length circumferential nonobstructing in the jejunum. The final pathology was consistent with metastatic adenocarcinoma. 3) status post laparoscopic laparotomy and resection of proximal lesion and and distal jejunum/proximal ileum under the care of Dr. Hassell Done 1 02/27/2016. 3)  Systemic chemotherapy with carboplatin for AUC of 5 and Alimta 500 MG/M2 every 3 weeks. First dose 04/04/2016. Status post 2 cycles. Last dose was given 04/21/2016 discontinued secondary to disease progression.   CURRENT THERAPY: Second line immunotherapy with Ketruda 200 mg IV every 2 weeks, first dose 05/30/2016. Status post 22 cycles.  INTERVAL HISTORY: Brett [name] 57 y.o. male to the clinic today for follow-up visit accompanied by his wife.  The patient is feeling fine today with no concerning  complaints.  He lost 2 pounds since his last visit.  He eats good.  He is very active.  He denied having any chest pain, shortness of breath, cough or hemoptysis.  He denied having any nausea, vomiting, diarrhea or constipation.  He has no headache or visual changes.  He denied having any fever or chills.  He continues to tolerate his treatment with Keytruda fairly well.  The patient had repeat CT scan of the chest, abdomen and pelvis performed recently and he is here for evaluation and discussion of his discuss results.  MEDICAL HISTORY: Past Medical History:  Diagnosis Date  . Anemia 11/28/2015  . Anxiety   . Arthritis    "hands, back" (11/28/2015)  . Barrett's esophagus 07/01/2013   Without dysplasia on biopsy 09/03/2012. Repeat EGD recommended 08/2015  . Bilateral cataracts 02/13/2017  . Carotid artery stenosis 07/01/2013   Requiring right sided stent   . Chronic pain syndrome 07/01/2013  . Closed head injury with brief loss of consciousness ([place]) 07/22/2010   Head trapped in a hydraulic device at work.  Fracture of orbital bones on right and brief loss of consciousness per report.  . 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 There was likely a strong psycho-social overlay in regard to the cognitive deficits in the form of mood disorder with psychotic features and mixed anxiety symptomatology. His primary tested cognitive deficits are in the areas of attention, executi  . Daily headache "since 07/2010"   constantly  . Degenerative joint disease of cervical spine 07/01/2013  . Dehydration 04/11/2016  . Dupuytren's contracture of both hands 04/08/2014  . Encounter for antineoplastic chemotherapy 10/05/2015  . Encounter for antineoplastic  immunotherapy 05/24/2016  . Erectile dysfunction associated with type 2 diabetes mellitus ([place]) 07/01/2013  . Fibromyalgia  07/01/2013  . Goals of care, counseling/discussion 03/28/2016  . History of blood transfusion 11/28/2015   "suppose to get his first today" (11/28/2015)  . Hyperlipidemia LDL goal < 100 07/01/2013  . Intractable hiccups 04/11/2016  . 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 Memory changes    "memory issues" from head injury  . Moderate protein-calorie malnutrition ([place]) 11/29/2015  . Osteoarthritis of right thumb 10/21/2014  . Peripheral vascular occlusive disease ([place]) 07/01/2013   Requiring 2 arterial stents above the left knee per report  . Pneumonia ~ 2006/2007  . Post traumatic stress disorder 07/01/2013  . Primary lung adenocarcinoma ([place]) dx'd 08/2015   "right lung"  . Severe major depression with psychotic features ([place]) 04/15/2011  . Tobacco abuse 07/01/2013  . Tobacco abuse   . Type 2 diabetes mellitus with vascular disease ([place]) 07/01/2013   Left lower extremity and right carotid stenting    ALLERGIES:  is allergic to gabapentin; lyrica [pregabalin]; celebrex [celecoxib]; and contrast media [iodinated diagnostic agents].  MEDICATIONS:  Current Outpatient Medications  Medication Sig Dispense Refill  . aspirin EC 81 MG tablet Take 81 mg by mouth daily.     Marland Kitchen atorvastatin (LIPITOR) 40 MG tablet Take 1 tablet (40 mg total) by mouth daily. 90 tablet 3  . cyclobenzaprine (FLEXERIL) 10 MG tablet Take 1 tablet (10 mg total) by mouth 3 (three) times daily as needed for muscle spasms. 90 tablet 11  . diphenhydrAMINE (BENADRYL) 25 mg capsule Take 2 capsules (50 mg total) by mouth as directed. Take prior to CT scan as directed 30 capsule 0  . dronabinol (MARINOL) 2.5 MG capsule Take 1 capsule (2.5 mg total) by mouth 2 (two) times daily before a meal. 60 capsule 0  . empagliflozin (JARDIANCE) 10 MG TABS tablet Take 10 mg by mouth daily. 90 tablet 3  . erythromycin ophthalmic ointment Place 1 application into both eyes 2 (two) times daily.  1  . glipiZIDE (GLUCOTROL) 10 MG tablet Take 2 tablets  (20 mg total) by mouth 2 (two) times daily before a meal. 360 tablet 3  . hydrOXYzine (ATARAX/VISTARIL) 10 MG tablet Take 1 tablet (10 mg total) by mouth 3 (three) times daily as needed for itching. 30 tablet 0  . lidocaine-prilocaine (EMLA) cream Apply 1 application topically as needed. 30 g 0  . lisinopril (PRINIVIL,ZESTRIL) 5 MG tablet Take 0.5 tablets (2.5 mg total) by mouth daily. 90 tablet 3  . metFORMIN (GLUCOPHAGE-XR) 500 MG 24 hr tablet Take 1 tablet (500 mg total) by mouth 2 (two) times daily. 180 tablet 3  . metoCLOPramide (REGLAN) 10 MG tablet TAKE 1 TABLET BY MOUTH FOUR TIMES DAILY AS NEEDED AS DIRECTED 30 tablet 0  . ondansetron (ZOFRAN) 4 MG tablet Take 1 tablet (4 mg total) by mouth 3 (three) times daily. 20 tablet 0  . oxyCODONE-acetaminophen (PERCOCET) 10-325 MG tablet 1-2 tablets every 6 hours as needed for moderate pain 240 tablet 0  . pantoprazole (PROTONIX) 40 MG tablet Take 1 tablet (40 mg total) by mouth daily. 90 tablet 3  . predniSONE (DELTASONE) 50 MG tablet Take 1 tablet (50 mg total) by mouth as directed. Take 50 mg 13 hours. 7 hours and 1 hour prior to scan 3 tablet 2  . prochlorperazine (COMPAZINE) 10 MG tablet Take 10 mg by mouth every 6 (six) hours as needed for nausea or vomiting.    Marland Kitchen  prochlorperazine (COMPAZINE) 25 MG suppository Place 1 suppository (25 mg total) rectally every 12 (twelve) hours as needed for refractory nausea / vomiting. 12 suppository 11  . PROLENSA 0.07 % SOLN Place 1 drop into both eyes 2 (two) times daily.  0  . senna-docusate (SENOKOT-S) 8.6-50 MG tablet Take 1-2 tablets by mouth 2 (two) times daily as needed for mild constipation or moderate constipation. 30 tablet 1  . sitaGLIPtin (JANUVIA) 100 MG tablet Take 1 tablet (100 mg total) by mouth daily. 90 tablet 3  . sorbitol 70 % solution Take 15 cc every 4 hours until bowel movement 473 mL 0   Current Facility-Administered Medications  Medication Dose Route Frequency Provider Last Rate Last  Dose  . cyanocobalamin ((VITAMIN B-12)) injection 1,000 mcg  1,000 mcg Intramuscular Q30 days [name], MD   1,000 mcg at 06/18/17 1425    SURGICAL HISTORY:  Past Surgical History:  Procedure Laterality Date  . CAROTID STENT Right ?2014  . COLONOSCOPY N/A 11/30/2015   Procedure: COLONOSCOPY;  Surgeon: Teena Irani, MD;  Location[facility] ENDOSCOPY;  Service: Endoscopy;  Laterality: N/A;  . ESOPHAGOGASTRODUODENOSCOPY N/A 11/30/2015   Procedure: ESOPHAGOGASTRODUODENOSCOPY (EGD);  Surgeon: Teena Irani, MD;  Location: [facility] ENDOSCOPY;  Service: Endoscopy;  Laterality: N/A;  . ESOPHAGOGASTRODUODENOSCOPY (EGD) WITH PROPOFOL N/A 01/05/2016   Procedure: ESOPHAGOGASTRODUODENOSCOPY (EGD) WITH PROPOFOL;  Surgeon: Teena Irani, MD;  Location: [facility] ENDOSCOPY;  Service: Endoscopy;  Laterality: N/A;  . FEMORAL ARTERY STENT Left 05/2012; ~ 2015   Ar[facility] Endo 06/04/2012; Raechel Chute report  . FRACTURE SURGERY    . GIVENS CAPSULE STUDY N/A 12/22/2015   Procedure: GIVENS CAPSULE STUDY;  Surgeon: Wonda Horner, MD;  Location: [facility] ENDOSCOPY;  Service: Endoscopy;  Laterality: N/A;  . HARDWARE REMOVAL Right 11/15/2011   Removal of deep frontozygomatic orbital hardware/notes 11/15/2011  . HERNIA REPAIR  0347   Umbilical  . LAPAROSCOPY N/A 02/27/2016   Procedure: LAPAROSCOPY, LAPAROTOMY  WITH TWO SMALL BOWEL RESECTION;  Surgeon: Johnathan Hausen, MD;  Location: [facility] ORS;  Service: General;  Laterality: N/A;  . ORIF ORBITAL FRACTURE Right 08/15/2010    caught in a hydraulic machine; open reduction internal fixation of orbital rim fracture and open reduction of zygomatic arch fracture  Ar[facility] Endo 10/13/2009  . PORTACATH PLACEMENT Left 04/04/2016   Procedure: INSERTION PORT-A-CATH LEFT CHEST;  Surgeon: Melrose Nakayama, MD;  Location: [facility];  Service: Thoracic;  Laterality: Left;  Marland Kitchen VIDEO ASSISTED THORACOSCOPY (VATS)/ LOBECTOMY Right 10/18/2015   Procedure: VIDEO ASSISTED THORACOSCOPY (VATS)/ LOBECTOMY;  Surgeon: Melrose Nakayama, MD;   Location: [place] [facility];  Service: Thoracic;  Laterality: Right;  Marland Kitchen VIDEO BRONCHOSCOPY Bilateral 09/21/2015   Procedure: VIDEO BRONCHOSCOPY WITH FLUORO;  Surgeon: Juanito Doom, MD;  Location: [facility] ENDOSCOPY;  Service: Cardiopulmonary;  Laterality: Bilateral;    REVIEW OF SYSTEMS:  Constitutional: positive for weight loss Eyes: negative Ears, nose, mouth, throat, and face: negative Respiratory: negative Cardiovascular: negative Gastrointestinal: negative Genitourinary:negative Integument/breast: negative Hematologic/lymphatic: negative Musculoskeletal:negative Neurological: negative Behavioral/Psych: negative Endocrine: negative Allergic/Immunologic: negative   PHYSICAL EXAMINATION: General appearance: alert, cooperative and no distress Head: Normocephalic, without obvious abnormality, atraumatic Neck: no adenopathy, no JVD, supple, symmetrical, trachea midline and thyroid not enlarged, symmetric, no tenderness/mass/nodules Lymph nodes: Cervical, supraclavicular, and axillary nodes normal. Resp: clear to auscultation bilaterally Back: symmetric, no curvature. ROM normal. No CVA tenderness. Cardio: regular rate and rhythm, S1, S2 normal, no murmur, click, rub or gallop GI: soft, non-tender; bowel sounds normal; no masses,  no organomegaly Extremities: extremities normal,  atraumatic, no cyanosis or edema Neurologic: Alert and oriented X 3, normal strength and tone. Normal symmetric reflexes. Normal coordination and gait  ECOG PERFORMANCE STATUS: 1 - Symptomatic but completely ambulatory  Blood pressure 103/72, pulse 71, temperature 98.8 F (37.1 C), temperature source Oral, resp. rate 18, height '5\' 7"'  (1.702 m), weight 110 lb 9.6 oz (50.2 kg), SpO2 99 %.  LABORATORY DATA: Lab Results  Component Value Date   WBC 7.2 09/11/2017   HGB 14.9 09/11/2017   HCT 44.2 09/11/2017   MCV 90.2 09/11/2017   PLT 211 09/11/2017      Chemistry      Component Value Date/Time   NA 140 08/21/2017  0823   NA 140 01/23/2017 0910   K 4.2 08/21/2017 0823   K 4.5 01/23/2017 0910   CL 102 08/21/2017 0823   CO2 27 08/21/2017 0823   CO2 28 01/23/2017 0910   BUN 16 08/21/2017 0823   BUN 8.1 01/23/2017 0910   CREATININE 0.84 08/21/2017 0823   CREATININE 0.8 01/23/2017 0910   GLU 398 (H) 08/08/2016 1257      Component Value Date/Time   CALCIUM 10.6 (H) 08/21/2017 0823   CALCIUM 9.6 01/23/2017 0910   ALKPHOS 151 (H) 08/21/2017 0823   ALKPHOS 113 01/23/2017 0910   AST 14 (L) 08/21/2017 0823   AST 11 01/23/2017 0910   ALT 13 08/21/2017 0823   ALT 12 01/23/2017 0910   BILITOT 0.4 08/21/2017 0823   BILITOT 0.40 01/23/2017 0910       RADIOGRAPHIC STUDIES: Ct Chest W Contrast  Result Date: 09/09/2017 CLINICAL DATA:  Metastatic non-small cell lung cancer of the right upper lobe, metastatic to the small intestine EXAM: CT CHEST, ABDOMEN, AND PELVIS WITH CONTRAST TECHNIQUE: Multidetector CT imaging of the chest, abdomen and pelvis was performed following the standard protocol during bolus administration of intravenous contrast. CONTRAST:  21m OMNIPAQUE IOHEXOL 300 MG/ML  SOLN COMPARISON:  Multiple exams, including 06/18/2017 FINDINGS: CT CHEST FINDINGS Cardiovascular: Left Port-A-Cath tip: SVC. Coronary, aortic arch, and branch vessel atherosclerotic vascular disease. Mediastinum/Nodes: Unremarkable.  No pathologic adenopathy. Lungs/Pleura: Right upper lobectomy. Small staple line laterally along the minor fissure. 2 mm left upper lobe nodule on image 28/6, stable. 2 mm left upper lobe nodule on image 32/6, stable. Musculoskeletal: Mild thoracic spondylosis. CT ABDOMEN PELVIS FINDINGS Hepatobiliary: Unremarkable Pancreas: Unremarkable Spleen: Unremarkable Adrenals/Urinary Tract: Unremarkable Stomach/Bowel: Prominent stool throughout the colon favors constipation. Vascular/Lymphatic: Aortoiliac atherosclerotic vascular disease. A central mesenteric lymph node measures 0.8 cm in short axis on image  75/2, stable from 06/18/2017. Reproductive: Calcification in the right central prostate gland. Other: No supplemental non-categorized findings. Musculoskeletal: Mild lower lumbar spondylosis and degenerative disc disease. IMPRESSION: 1. No findings of active malignancy. 2.  Prominent stool throughout the colon favors constipation. 3. There are 2 small pulmonary nodules in the left upper lobe in the 2-3 mm range which are stable and probably benign. 4. Other imaging findings of potential clinical significance: Aortic Atherosclerosis (ICD10-I70.0). Coronary atherosclerosis. Right upper lobectomy. Lower lumbar spondylosis and degenerative disc disease. Electronically Signed   By: WVan ClinesM.D.   On: 09/09/2017 16:28   Ct Abdomen Pelvis W Contrast  Result Date: 09/09/2017 CLINICAL DATA:  Metastatic non-small cell lung cancer of the right upper lobe, metastatic to the small intestine EXAM: CT CHEST, ABDOMEN, AND PELVIS WITH CONTRAST TECHNIQUE: Multidetector CT imaging of the chest, abdomen and pelvis was performed following the standard protocol during bolus administration of intravenous contrast. CONTRAST:  849m  OMNIPAQUE IOHEXOL 300 MG/ML  SOLN COMPARISON:  Multiple exams, including 06/18/2017 FINDINGS: CT CHEST FINDINGS Cardiovascular: Left Port-A-Cath tip: SVC. Coronary, aortic arch, and branch vessel atherosclerotic vascular disease. Mediastinum/Nodes: Unremarkable.  No pathologic adenopathy. Lungs/Pleura: Right upper lobectomy. Small staple line laterally along the minor fissure. 2 mm left upper lobe nodule on image 28/6, stable. 2 mm left upper lobe nodule on image 32/6, stable. Musculoskeletal: Mild thoracic spondylosis. CT ABDOMEN PELVIS FINDINGS Hepatobiliary: Unremarkable Pancreas: Unremarkable Spleen: Unremarkable Adrenals/Urinary Tract: Unremarkable Stomach/Bowel: Prominent stool throughout the colon favors constipation. Vascular/Lymphatic: Aortoiliac atherosclerotic vascular disease. A central  mesenteric lymph node measures 0.8 cm in short axis on image 75/2, stable from 06/18/2017. Reproductive: Calcification in the right central prostate gland. Other: No supplemental non-categorized findings. Musculoskeletal: Mild lower lumbar spondylosis and degenerative disc disease. IMPRESSION: 1. No findings of active malignancy. 2.  Prominent stool throughout the colon favors constipation. 3. There are 2 small pulmonary nodules in the left upper lobe in the 2-3 mm range which are stable and probably benign. 4. Other imaging findings of potential clinical significance: Aortic Atherosclerosis (ICD10-I70.0). Coronary atherosclerosis. Right upper lobectomy. Lower lumbar spondylosis and degenerative disc disease. Electronically Signed   By: Van Clines M.D.   On: 09/09/2017 16:28     ASSESSMENT AND PLAN:  This is a very pleasant 57 years old white male with metastatic non-small cell lung cancer, adenocarcinoma status post right upper lobectomy with lymph node dissection. Unfortunately the patient was found to have metastatic disease in the jejunum and terminal ileum.  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 The patient was started on treatment with systemic chemotherapy with carboplatin and Alimta for 2 cycles discontinued secondary to intolerance and disease progression. The patient is currently undergoing treatment with second line immunotherapy with Ketruda (pembrolizumab) 200 mg IV every 3 weeks, status post 22 cycles. , Abdomen and pelvis performed recently.  I personally and independently reviewed the scans and discussed the results with the patient and his wife. His scan showed no concerning findings for disease progression. I recommended for the patient to continue his current treatment with [facility] and he will proceed with cycle #23 today. For the diabetes  mellitus, he will continue with his current medications. For the weight loss, encouraged the patient to increase his oral intake and snacking between meals. I will see the patient back for follow-up visit in 3 weeks for evaluation before starting cycle #24. He was advised to call immediately if he has any concerning symptoms in the interval. The patient voices understanding of current disease status and treatment options and is in agreement with the current care plan. All questions were answered. The patient knows to call the clinic with any problems, questions or concerns. We can certainly see the patient much sooner if necessary.  Disclaimer: This note was dictated with voice recognition software. Similar sounding words can inadvertently be transcribed and may not be corrected upon review.

## 2017-09-11 NOTE — Patient Instructions (Signed)
[facility] Discharge Instructions for Patients Receiving Chemotherapy  Today you received the following chemotherapy agents :  Keytruda.  To help prevent nausea and vomiting after your treatment, we encourage you to take your nausea medication as prescribed.   If you develop nausea and vomiting that is not controlled by your nausea medication, call the clinic.   BELOW ARE SYMPTOMS THAT SHOULD BE REPORTED IMMEDIATELY:  *FEVER GREATER THAN 100.5 F  *CHILLS WITH OR WITHOUT FEVER  NAUSEA AND VOMITING THAT IS NOT CONTROLLED WITH YOUR NAUSEA MEDICATION  *UNUSUAL SHORTNESS OF BREATH  *UNUSUAL BRUISING OR BLEEDING  TENDERNESS IN MOUTH AND THROAT WITH OR WITHOUT PRESENCE OF ULCERS  *URINARY PROBLEMS  *BOWEL PROBLEMS  UNUSUAL RASH Items with * indicate a potential emergency and should be followed up as soon as possible.  Feel free to call the clinic should you have any questions or concerns. The clinic phone number is [phone].  Please show the CHEMO ALERT CARD at check-in to the Emergency Department and triage nurse.  

## 2017-09-11 NOTE — Progress Notes (Signed)
Late entry :  Glucose  488  from CMET results today.  Pt stated he withheld Metformin on Tuesday due to scans.  Pt was premedicated with high dose Prednisone prior to scans.   Pt did resume Metformin this am.  A&O x 4.   Dr. Julien Nordmann notified.  Order received for Regular insulin 10 units SQ.  Explanations given to pt and wife.  Both voiced understanding.

## 2017-10-02 ENCOUNTER — Inpatient Hospital Stay: Payer: PPO

## 2017-10-02 ENCOUNTER — Inpatient Hospital Stay (HOSPITAL_BASED_OUTPATIENT_CLINIC_OR_DEPARTMENT_OTHER): Payer: PPO | Admitting: Oncology

## 2017-10-02 ENCOUNTER — Encounter: Payer: Self-pay | Admitting: Oncology

## 2017-10-02 ENCOUNTER — Inpatient Hospital Stay: Payer: PPO | Attending: Internal Medicine

## 2017-10-02 VITALS — BP 102/81 | HR 100 | Temp 98.2°F | Resp 18 | Ht 67.0 in | Wt 106.7 lb

## 2017-10-02 DIAGNOSIS — C3411 Malignant neoplasm of upper lobe, right bronchus or lung: Secondary | ICD-10-CM | POA: Diagnosis not present

## 2017-10-02 DIAGNOSIS — E119 Type 2 diabetes mellitus without complications: Secondary | ICD-10-CM

## 2017-10-02 DIAGNOSIS — C3491 Malignant neoplasm of unspecified part of right bronchus or lung: Secondary | ICD-10-CM

## 2017-10-02 DIAGNOSIS — Z5112 Encounter for antineoplastic immunotherapy: Secondary | ICD-10-CM

## 2017-10-02 DIAGNOSIS — R634 Abnormal weight loss: Secondary | ICD-10-CM | POA: Insufficient documentation

## 2017-10-02 DIAGNOSIS — R5383 Other fatigue: Secondary | ICD-10-CM | POA: Diagnosis not present

## 2017-10-02 DIAGNOSIS — Z794 Long term (current) use of insulin: Secondary | ICD-10-CM | POA: Diagnosis not present

## 2017-10-02 DIAGNOSIS — Z79899 Other long term (current) drug therapy: Secondary | ICD-10-CM | POA: Diagnosis not present

## 2017-10-02 DIAGNOSIS — C784 Secondary malignant neoplasm of small intestine: Secondary | ICD-10-CM | POA: Diagnosis not present

## 2017-10-02 DIAGNOSIS — R630 Anorexia: Secondary | ICD-10-CM | POA: Insufficient documentation

## 2017-10-02 DIAGNOSIS — E1159 Type 2 diabetes mellitus with other circulatory complications: Secondary | ICD-10-CM

## 2017-10-02 LAB — CMP (CANCER CENTER ONLY)
ALT: 9 U/L (ref 0–44)
AST: 11 U/L — ABNORMAL LOW (ref 15–41)
Albumin: 4 g/dL (ref 3.5–5.0)
Alkaline Phosphatase: 137 U/L — ABNORMAL HIGH (ref 38–126)
Anion gap: 12 (ref 5–15)
BUN: 16 mg/dL (ref 6–20)
CO2: 25 mmol/L (ref 22–32)
Calcium: 10.4 mg/dL — ABNORMAL HIGH (ref 8.9–10.3)
Chloride: 101 mmol/L (ref 98–111)
Creatinine: 0.94 mg/dL (ref 0.61–1.24)
GFR, Est AFR Am: >60 mL/min (ref >60)
GFR, Estimated: >60 mL/min (ref >60)
Glucose, Bld: 311 mg/dL — ABNORMAL HIGH (ref 70–99)
Potassium: 4.5 mmol/L (ref 3.5–5.1)
Sodium: 138 mmol/L (ref 135–145)
Total Bilirubin: 0.5 mg/dL (ref 0.3–1.2)
Total Protein: 7.5 g/dL (ref 6.5–8.1)

## 2017-10-02 LAB — GLUCOSE, CAPILLARY: Glucose-Capillary: 413 mg/dL — ABNORMAL HIGH (ref 70–99)

## 2017-10-02 LAB — CBC WITH DIFFERENTIAL (CANCER CENTER ONLY)
Basophils Absolute: 0 10*3/uL (ref 0.0–0.1)
Basophils Relative: 0 %
Eosinophils Absolute: 0.3 10*3/uL (ref 0.0–0.5)
Eosinophils Relative: 5 %
HCT: 45 % (ref 38.4–49.9)
Hemoglobin: 15.2 g/dL (ref 13.0–17.1)
Lymphocytes Relative: 20 %
Lymphs Abs: 1.2 10*3/uL (ref 0.9–3.3)
MCH: 30.4 pg (ref 27.2–33.4)
MCHC: 33.8 g/dL (ref 32.0–36.0)
MCV: 90 fL (ref 79.3–98.0)
Monocytes Absolute: 0.4 10*3/uL (ref 0.1–0.9)
Monocytes Relative: 7 %
Neutro Abs: 4 10*3/uL (ref 1.5–6.5)
Neutrophils Relative %: 68 %
Platelet Count: 173 10*3/uL (ref 140–400)
RBC: 5 MIL/uL (ref 4.20–5.82)
RDW: 14.2 % (ref 11.0–14.6)
WBC Count: 6 10*3/uL (ref 4.0–10.3)

## 2017-10-02 LAB — TSH: TSH: 3.299 u[IU]/mL (ref 0.320–4.118)

## 2017-10-02 MED ORDER — HEPARIN SOD (PORK) LOCK FLUSH 100 UNIT/ML IV SOLN
500.0000 [IU] | Freq: Once | INTRAVENOUS | Status: AC | PRN
  Administered 2017-10-02: 500 [IU]
  Filled 2017-10-02: qty 5

## 2017-10-02 MED ORDER — SODIUM CHLORIDE 0.9 % IV SOLN
Freq: Once | INTRAVENOUS | Status: AC
  Administered 2017-10-02: 10:00:00 via INTRAVENOUS
  Filled 2017-10-02: qty 250

## 2017-10-02 MED ORDER — SODIUM CHLORIDE 0.9% FLUSH
10.0000 mL | INTRAVENOUS | Status: DC | PRN
  Administered 2017-10-02: 10 mL
  Filled 2017-10-02: qty 10

## 2017-10-02 MED ORDER — INSULIN REGULAR HUMAN 100 UNIT/ML IJ SOLN
6.0000 [IU] | Freq: Once | INTRAMUSCULAR | Status: AC
  Administered 2017-10-02: 6 [IU] via SUBCUTANEOUS
  Filled 2017-10-02: qty 0.06

## 2017-10-02 MED ORDER — SODIUM CHLORIDE 0.9 % IV SOLN
200.0000 mg | Freq: Once | INTRAVENOUS | Status: AC
  Administered 2017-10-02: 200 mg via INTRAVENOUS
  Filled 2017-10-02: qty 8

## 2017-10-02 MED ORDER — DRONABINOL 2.5 MG PO CAPS: 2.5000 mg | ORAL_CAPSULE | Freq: Two times a day (BID) | ORAL | 0 refills | Status: DC

## 2017-10-02 NOTE — Assessment & Plan Note (Addendum)
This is a very pleasant 57 year old white male with metastatic non-small cell lung cancer, adenocarcinoma status post right upper lobectomy with lymph node dissection. Unfortunately the patient was found to have metastatic disease in the jejunum and terminal ileum.  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 The patient was started on treatment with systemic chemotherapy with carboplatin and Alimta for 2 cycles discontinued secondary to intolerance and disease progression. The patient is currently undergoing treatment with second line immunotherapy with Ketruda (pembrolizumab) 200 mg IV every 3 weeks, status post 23 cycles.  He is having decreased appetite, weight loss, and fatigue.  He is not routinely taking his Marinol.  It is not clear if some of the symptoms are related to his treatment versus uncontrolled diabetes. Recommend for the patient to proceed with cycle #24 of his treatment today as scheduled.  He will follow-up in 3 weeks for evaluation prior to cycle #25.  For the weight loss, I have refilled his Marinol.  I have advised him to take this twice a day every day.  The patient's blood sugar is elevated today at 311.  The patient will receive 6 units of regular insulin in our infusion area today.  He has a follow-up visit tomorrow with his primary care provider to discuss his diabetes.  I am not sure if his decreased appetite and weight loss is related to his Keytruda versus his uncontrolled diabetes.  He was advised to call immediately if he has any concerning symptoms in the interval. The patient voices understanding of current disease status and treatment options and is in agreement with the current care plan. All questions were answered. The patient knows to call the clinic with any problems, questions or concerns. We can certainly see the patient much  sooner if necessary.

## 2017-10-02 NOTE — Patient Instructions (Signed)
[facility] Discharge Instructions for Patients Receiving Chemotherapy  Today you received the following chemotherapy agents:  Keytruda.  To help prevent nausea and vomiting after your treatment, we encourage you to take your nausea medication as directed.   If you develop nausea and vomiting that is not controlled by your nausea medication, call the clinic.   BELOW ARE SYMPTOMS THAT SHOULD BE REPORTED IMMEDIATELY:  *FEVER GREATER THAN 100.5 F  *CHILLS WITH OR WITHOUT FEVER  NAUSEA AND VOMITING THAT IS NOT CONTROLLED WITH YOUR NAUSEA MEDICATION  *UNUSUAL SHORTNESS OF BREATH  *UNUSUAL BRUISING OR BLEEDING  TENDERNESS IN MOUTH AND THROAT WITH OR WITHOUT PRESENCE OF ULCERS  *URINARY PROBLEMS  *BOWEL PROBLEMS  UNUSUAL RASH Items with * indicate a potential emergency and should be followed up as soon as possible.  Feel free to call the clinic should you have any questions or concerns. The clinic phone number is [phone].  Please show the CHEMO ALERT CARD at check-in to the Emergency Department and triage nurse.    

## 2017-10-02 NOTE — Progress Notes (Signed)
Per MD order, pt received 6 units of insulin subcu.  30 minutes post pt FSBS 413.  Erasmo Downer NP notified and orders received to instruct pt to monitor FSBS closely at home and to follow up with endocrinologist tomorrow. Pt educated and stated understanding.

## 2017-10-02 NOTE — Progress Notes (Signed)
[facility] OFFICE PROGRESS NOTE  [name], MD [place] [address]  DIAGNOSIS: Stage IV (T1b, N0, M1b) non-small cell lung cancer, adenocarcinoma presented with right upper lobe lung nodule and recent metastasis to the small intestine. This was initially diagnosed in September 2017.  Genomic Alterations Identified? ERBB2 amplification - equivocal? CDKN2A p16INK4a E88* and p14ARF Q008Q SMARCA4 splice site [id number]>TI SPTA1 E2022* TOP2A amplification TP53 A159P Additional Findings? Microsatellite status MS-Stable Tumor Mutation Burden TMB-Intermediate; 18 Muts/Mb Additional Disease-relevant Genes with No Reportable Alterations Identified? EGFR KRAS ALK BRAF MET RET ROS1   PRIOR THERAPY:  1) Status post right VATS with right upper lobectomy and mediastinal lymph node dissection under the care of Dr. Roxan Hockey on 10/18/2015 and the final pathology was consistent with stage IA (T1b, N0, MX). 2) upper endoscopy on 01/05/2016 showed normal esophagus, normal stomach but there was occasional mass around 3.0 CM in length circumferential nonobstructing in the jejunum. The final pathology was consistent with metastatic adenocarcinoma. 3) status post laparoscopic laparotomy and resection of proximal lesion and and distal jejunum/proximal ileum under the care of Dr. Hassell Done 1 02/27/2016. 3)  Systemic chemotherapy with carboplatin for AUC of 5 and Alimta 500 MG/M2 every 3 weeks. First dose 04/04/2016. Status post 2 cycles. Last dose was given 04/21/2016 discontinued secondary to disease progression.  CURRENT THERAPY: Second line immunotherapy with Ketruda 200 mg IV every 2 weeks, first dose 05/30/2016. Status post 23 cycles.  INTERVAL HISTORY: Brett [name] 57 y.o. male returns for routine follow-up visit accompanied by his wife.  The patient reports that he is having fatigue, decreased appetite, and weight loss.  He thinks that his diabetes  medication may be causing some of these issues.  He is due to his follow-up with them tomorrow.  He reports that his blood sugars have not been controlled at home.  He denies fevers and chills.  Denies chest pain, shortness of breath, cough, hemoptysis.  Denies nausea, vomiting, constipation, diarrhea.  Denies night sweats.  The patient is here for evaluation prior to cycle #24 of his treatment.  MEDICAL HISTORY: Past Medical History:  Diagnosis Date  . Anemia 11/28/2015  . Anxiety   . Arthritis    "hands, back" (11/28/2015)  . Barrett's esophagus 07/01/2013   Without dysplasia on biopsy 09/03/2012. Repeat EGD recommended 08/2015  . Bilateral cataracts 02/13/2017  . Carotid artery stenosis 07/01/2013   Requiring right sided stent   . Chronic pain syndrome 07/01/2013  . Closed head injury with brief loss of consciousness ([place]) 07/22/2010   Head trapped in a hydraulic device at work.  Fracture of orbital bones on right and brief loss of consciousness per report.  . 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 There was likely a strong psycho-social overlay in regard to the cognitive deficits in the form of mood disorder with psychotic features and mixed anxiety symptomatology. His primary tested cognitive deficits are in the areas of attention, executi  . Daily headache "since 07/2010"   constantly  . Degenerative joint disease of cervical spine 07/01/2013  . Dehydration 04/11/2016  . Dupuytren's contracture of both hands 04/08/2014  . Encounter for antineoplastic chemotherapy 10/05/2015  . Encounter for antineoplastic immunotherapy 05/24/2016  . Erectile dysfunction associated with type 2 diabetes mellitus ([place]) 07/01/2013  . Fibromyalgia 07/01/2013  . Goals of care, counseling/discussion 03/28/2016  . History of blood  transfusion 11/28/2015   "suppose to get his first  today" (11/28/2015)  . Hyperlipidemia LDL goal < 100 07/01/2013  . Intractable hiccups 04/11/2016  . 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 Memory changes    "memory issues" from head injury  . Moderate protein-calorie malnutrition ([place]) 11/29/2015  . Osteoarthritis of right thumb 10/21/2014  . Peripheral vascular occlusive disease ([place]) 07/01/2013   Requiring 2 arterial stents above the left knee per report  . Pneumonia ~ 2006/2007  . Post traumatic stress disorder 07/01/2013  . Primary lung adenocarcinoma ([place]) dx'd 08/2015   "right lung"  . Severe major depression with psychotic features ([place]) 04/15/2011  . Tobacco abuse 07/01/2013  . Tobacco abuse   . Type 2 diabetes mellitus with vascular disease ([place]) 07/01/2013   Left lower extremity and right carotid stenting    ALLERGIES:  is allergic to gabapentin; lyrica [pregabalin]; celebrex [celecoxib]; and contrast media [iodinated diagnostic agents].  MEDICATIONS:  Current Outpatient Medications  Medication Sig Dispense Refill  . aspirin EC 81 MG tablet Take 81 mg by mouth daily.     Marland Kitchen atorvastatin (LIPITOR) 40 MG tablet Take 1 tablet (40 mg total) by mouth daily. 90 tablet 3  . cyclobenzaprine (FLEXERIL) 10 MG tablet Take 1 tablet (10 mg total) by mouth 3 (three) times daily as needed for muscle spasms. 90 tablet 11  . diphenhydrAMINE (BENADRYL) 25 mg capsule Take 2 capsules (50 mg total) by mouth as directed. Take prior to CT scan as directed 30 capsule 0  . dronabinol (MARINOL) 2.5 MG capsule Take 1 capsule (2.5 mg total) by mouth 2 (two) times daily before a meal. 60 capsule 0  . empagliflozin (JARDIANCE) 10 MG TABS tablet Take 10 mg by mouth daily. 90 tablet 3  . erythromycin ophthalmic ointment Place 1 application into both eyes 2 (two) times daily.  1  . glipiZIDE (GLUCOTROL) 10 MG tablet Take 2 tablets (20 mg total) by mouth 2 (two) times daily before a meal. 360 tablet 3  . hydrOXYzine (ATARAX/VISTARIL) 10 MG tablet Take 1 tablet  (10 mg total) by mouth 3 (three) times daily as needed for itching. 30 tablet 0  . lidocaine-prilocaine (EMLA) cream Apply 1 application topically as needed. 30 g 0  . lisinopril (PRINIVIL,ZESTRIL) 5 MG tablet Take 0.5 tablets (2.5 mg total) by mouth daily. 90 tablet 3  . metFORMIN (GLUCOPHAGE-XR) 500 MG 24 hr tablet Take 1 tablet (500 mg total) by mouth 2 (two) times daily. 180 tablet 3  . ondansetron (ZOFRAN) 4 MG tablet Take 1 tablet (4 mg total) by mouth 3 (three) times daily. 20 tablet 0  . oxyCODONE-acetaminophen (PERCOCET) 10-325 MG tablet 1-2 tablets every 6 hours as needed for moderate pain 240 tablet 0  . pantoprazole (PROTONIX) 40 MG tablet Take 1 tablet (40 mg total) by mouth daily. 90 tablet 3  . predniSONE (DELTASONE) 50 MG tablet Take 1 tablet (50 mg total) by mouth as directed. Take 50 mg 13 hours. 7 hours and 1 hour prior to scan 3 tablet 2  . prochlorperazine (COMPAZINE) 10 MG tablet Take 10 mg by mouth every 6 (six) hours as needed for nausea or vomiting.    . prochlorperazine (COMPAZINE) 25 MG suppository Place 1 suppository (25 mg total) rectally every 12 (twelve) hours as needed for refractory nausea / vomiting. 12 suppository 11  . PROLENSA 0.07 % SOLN Place 1 drop into both eyes 2 (two) times daily.  0  . senna-docusate (SENOKOT-S) 8.6-50 MG tablet Take  1-2 tablets by mouth 2 (two) times daily as needed for mild constipation or moderate constipation. 30 tablet 1  . sitaGLIPtin (JANUVIA) 100 MG tablet Take 1 tablet (100 mg total) by mouth daily. 90 tablet 3  . metoCLOPramide (REGLAN) 10 MG tablet TAKE 1 TABLET BY MOUTH FOUR TIMES DAILY AS NEEDED AS DIRECTED (Patient not taking: Reported on 10/02/2017) 30 tablet 0  . sorbitol 70 % solution Take 15 cc every 4 hours until bowel movement (Patient not taking: Reported on 10/02/2017) 473 mL 0   Current Facility-Administered Medications  Medication Dose Route Frequency Provider Last Rate Last Dose  . cyanocobalamin ((VITAMIN B-12))  injection 1,000 mcg  1,000 mcg Intramuscular Q30 days [name], MD   1,000 mcg at 06/18/17 1425   Facility-Administered Medications Ordered in Other Visits  Medication Dose Route Frequency Provider Last Rate Last Dose  . heparin lock flush 100 unit/mL  500 Units Intracatheter Once PRN Curt Bears, MD      . pembrolizumab [facility]) 200 mg in sodium chloride 0.9 % 50 mL chemo infusion  200 mg Intravenous Once Curt Bears, MD 116 mL/hr at 10/02/17 1058 200 mg at 10/02/17 1058  . sodium chloride flush (NS) 0.9 % injection 10 mL  10 mL Intracatheter PRN Curt Bears, MD        SURGICAL HISTORY:  Past Surgical History:  Procedure Laterality Date  . CAROTID STENT Right ?2014  . COLONOSCOPY N/A 11/30/2015   Procedure: COLONOSCOPY;  Surgeon: Teena Irani, MD;  Location: [facility] ENDOSCOPY;  Service: Endoscopy;  Laterality: N/A;  . ESOPHAGOGASTRODUODENOSCOPY N/A 11/30/2015   Procedure: ESOPHAGOGASTRODUODENOSCOPY (EGD);  Surgeon: Teena Irani, MD;  Location: [facility] ENDOSCOPY;  Service: Endoscopy;  Laterality: N/A;  . ESOPHAGOGASTRODUODENOSCOPY (EGD) WITH PROPOFOL N/A 01/05/2016   Procedure: ESOPHAGOGASTRODUODENOSCOPY (EGD) WITH PROPOFOL;  Surgeon: Teena Irani, MD;  Location: [facility] ENDOSCOPY;  Service: Endoscopy;  Laterality: N/A;  . FEMORAL ARTERY STENT Left 05/2012; ~ 2015   Archie Endo 06/04/2012; Raechel Chute report  . FRACTURE SURGERY    . GIVENS CAPSULE STUDY N/A 12/22/2015   Procedure: GIVENS CAPSULE STUDY;  Surgeon: Wonda Horner, MD;  Location: [facility] ENDOSCOPY;  Service: Endoscopy;  Laterality: N/A;  . HARDWARE REMOVAL Right 11/15/2011   Removal of deep frontozygomatic orbital hardware/notes 11/15/2011  . HERNIA REPAIR  9417   Umbilical  . LAPAROSCOPY N/A 02/27/2016   Procedure: LAPAROSCOPY, LAPAROTOMY  WITH TWO SMALL BOWEL RESECTION;  Surgeon: Johnathan Hausen, MD;  Location: [facility] ORS;  Service: General;  Laterality: N/A;  . ORIF ORBITAL FRACTURE Right 08/15/2010    caught in a hydraulic machine; open  reduction internal fixation of orbital rim fracture and open reduction of zygomatic arch fracture  Archie Endo 10/13/2009  . PORTACATH PLACEMENT Left 04/04/2016   Procedure: INSERTION PORT-A-CATH LEFT CHEST;  Surgeon: Melrose Nakayama, MD;  Location: [facility];  Service: Thoracic;  Laterality: Left;  Marland Kitchen VIDEO ASSISTED THORACOSCOPY (VATS)/ LOBECTOMY Right 10/18/2015   Procedure: VIDEO ASSISTED THORACOSCOPY (VATS)/ LOBECTOMY;  Surgeon: Melrose Nakayama, MD;  Location: [facility];  Service: Thoracic;  Laterality: Right;  Marland Kitchen VIDEO BRONCHOSCOPY Bilateral 09/21/2015   Procedure: VIDEO BRONCHOSCOPY WITH FLUORO;  Surgeon: Juanito Doom, MD;  Location: [facility] ENDOSCOPY;  Service: Cardiopulmonary;  Laterality: Bilateral;    REVIEW OF SYSTEMS:   Review of Systems  Constitutional: Negative for chills, fever.  Positive for fatigue, decreased appetite, and weight loss. HENT:   Negative for mouth sores, nosebleeds, sore throat and trouble swallowing.   Eyes: Negative for eye problems and icterus.  Respiratory: Negative for cough, hemoptysis, shortness of breath and wheezing.   Cardiovascular: Negative for chest pain and leg swelling.  Gastrointestinal: Negative for abdominal pain, constipation, diarrhea, nausea and vomiting.  Genitourinary: Negative for bladder incontinence, difficulty urinating, dysuria, frequency and hematuria.   Musculoskeletal: Negative for back pain, gait problem, neck pain and neck stiffness.  Skin: Negative for itching and rash.  Neurological: Negative for dizziness, extremity weakness, gait problem, headaches, light-headedness and seizures.  Hematological: Negative for adenopathy. Does not bruise/bleed easily.  Psychiatric/Behavioral: Negative for confusion, depression and sleep disturbance. The patient is not nervous/anxious.     PHYSICAL EXAMINATION:  Blood pressure 102/81, pulse 100, temperature 98.2 F (36.8 C), temperature source Oral, resp. rate 18, height _0  (1.702 m), weight 106  lb 11.2 oz (48.4 kg), SpO2 99 %.  ECOG PERFORMANCE STATUS: 1 - Symptomatic but completely ambulatory  Physical Exam  Constitutional: Oriented to person, place, and time. No distress.  HENT:  Head: Normocephalic and atraumatic.  Mouth/Throat: Oropharynx is clear and moist. No oropharyngeal exudate.  Eyes: Conjunctivae are normal. Right eye exhibits no discharge. Left eye exhibits no discharge. No scleral icterus.  Neck: Normal range of motion. Neck supple.  Cardiovascular: Normal rate, regular rhythm, normal heart sounds and intact distal pulses.   Pulmonary/Chest: Effort normal and breath sounds normal. No respiratory distress. No wheezes. No rales.  Abdominal: Soft. Bowel sounds are normal. Exhibits no distension and no mass. There is no tenderness.  Musculoskeletal: Normal range of motion. Exhibits no edema.  Lymphadenopathy:    No cervical adenopathy.  Neurological: Alert and oriented to person, place, and time. Exhibits normal muscle tone. Gait normal. Coordination normal.  Skin: Skin is warm and dry. No rash noted. Not diaphoretic. No erythema. No pallor.  Psychiatric: Mood, memory and judgment normal.  Vitals reviewed.  LABORATORY DATA: Lab Results  Component Value Date   WBC 6.0 10/02/2017   HGB 15.2 10/02/2017   HCT 45.0 10/02/2017   MCV 90.0 10/02/2017   PLT 173 10/02/2017      Chemistry      Component Value Date/Time   NA 138 10/02/2017 0840   NA 140 01/23/2017 0910   K 4.5 10/02/2017 0840   K 4.5 01/23/2017 0910   CL 101 10/02/2017 0840   CO2 25 10/02/2017 0840   CO2 28 01/23/2017 0910   BUN 16 10/02/2017 0840   BUN 8.1 01/23/2017 0910   CREATININE 0.94 10/02/2017 0840   CREATININE 0.8 01/23/2017 0910   GLU 398 (H) 08/08/2016 1257      Component Value Date/Time   CALCIUM 10.4 (H) 10/02/2017 0840   CALCIUM 9.6 01/23/2017 0910   ALKPHOS 137 (H) 10/02/2017 0840   ALKPHOS 113 01/23/2017 0910   AST 11 (L) 10/02/2017 0840   AST 11 01/23/2017 0910   ALT 9  10/02/2017 0840   ALT 12 01/23/2017 0910   BILITOT 0.5 10/02/2017 0840   BILITOT 0.40 01/23/2017 0910       RADIOGRAPHIC STUDIES:  Ct Chest W Contrast  Result Date: 09/09/2017 CLINICAL DATA:  Metastatic non-small cell lung cancer of the right upper lobe, metastatic to the small intestine EXAM: CT CHEST, ABDOMEN, AND PELVIS WITH CONTRAST TECHNIQUE: Multidetector CT imaging of the chest, abdomen and pelvis was performed following the standard protocol during bolus administration of intravenous contrast. CONTRAST:  50m OMNIPAQUE IOHEXOL 300 MG/ML  SOLN COMPARISON:  Multiple exams, including 06/18/2017 FINDINGS: CT CHEST FINDINGS Cardiovascular: Left Port-A-Cath tip: SVC. Coronary, aortic arch, and branch vessel  atherosclerotic vascular disease. Mediastinum/Nodes: Unremarkable.  No pathologic adenopathy. Lungs/Pleura: Right upper lobectomy. Small staple line laterally along the minor fissure. 2 mm left upper lobe nodule on image 28/6, stable. 2 mm left upper lobe nodule on image 32/6, stable. Musculoskeletal: Mild thoracic spondylosis. CT ABDOMEN PELVIS FINDINGS Hepatobiliary: Unremarkable Pancreas: Unremarkable Spleen: Unremarkable Adrenals/Urinary Tract: Unremarkable Stomach/Bowel: Prominent stool throughout the colon favors constipation. Vascular/Lymphatic: Aortoiliac atherosclerotic vascular disease. A central mesenteric lymph node measures 0.8 cm in short axis on image 75/2, stable from 06/18/2017. Reproductive: Calcification in the right central prostate gland. Other: No supplemental non-categorized findings. Musculoskeletal: Mild lower lumbar spondylosis and degenerative disc disease. IMPRESSION: 1. No findings of active malignancy. 2.  Prominent stool throughout the colon favors constipation. 3. There are 2 small pulmonary nodules in the left upper lobe in the 2-3 mm range which are stable and probably benign. 4. Other imaging findings of potential clinical significance: Aortic Atherosclerosis  (ICD10-I70.0). Coronary atherosclerosis. Right upper lobectomy. Lower lumbar spondylosis and degenerative disc disease. Electronically Signed   By: Van Clines M.D.   On: 09/09/2017 16:28   Ct Abdomen Pelvis W Contrast  Result Date: 09/09/2017 CLINICAL DATA:  Metastatic non-small cell lung cancer of the right upper lobe, metastatic to the small intestine EXAM: CT CHEST, ABDOMEN, AND PELVIS WITH CONTRAST TECHNIQUE: Multidetector CT imaging of the chest, abdomen and pelvis was performed following the standard protocol during bolus administration of intravenous contrast. CONTRAST:  15m OMNIPAQUE IOHEXOL 300 MG/ML  SOLN COMPARISON:  Multiple exams, including 06/18/2017 FINDINGS: CT CHEST FINDINGS Cardiovascular: Left Port-A-Cath tip: SVC. Coronary, aortic arch, and branch vessel atherosclerotic vascular disease. Mediastinum/Nodes: Unremarkable.  No pathologic adenopathy. Lungs/Pleura: Right upper lobectomy. Small staple line laterally along the minor fissure. 2 mm left upper lobe nodule on image 28/6, stable. 2 mm left upper lobe nodule on image 32/6, stable. Musculoskeletal: Mild thoracic spondylosis. CT ABDOMEN PELVIS FINDINGS Hepatobiliary: Unremarkable Pancreas: Unremarkable Spleen: Unremarkable Adrenals/Urinary Tract: Unremarkable Stomach/Bowel: Prominent stool throughout the colon favors constipation. Vascular/Lymphatic: Aortoiliac atherosclerotic vascular disease. A central mesenteric lymph node measures 0.8 cm in short axis on image 75/2, stable from 06/18/2017. Reproductive: Calcification in the right central prostate gland. Other: No supplemental non-categorized findings. Musculoskeletal: Mild lower lumbar spondylosis and degenerative disc disease. IMPRESSION: 1. No findings of active malignancy. 2.  Prominent stool throughout the colon favors constipation. 3. There are 2 small pulmonary nodules in the left upper lobe in the 2-3 mm range which are stable and probably benign. 4. Other imaging  findings of potential clinical significance: Aortic Atherosclerosis (ICD10-I70.0). Coronary atherosclerosis. Right upper lobectomy. Lower lumbar spondylosis and degenerative disc disease. Electronically Signed   By: WVan ClinesM.D.   On: 09/09/2017 16:28     ASSESSMENT/PLAN:  Primary adenocarcinoma of right lung ([facility] This is a very pleasant 57year old white male with metastatic non-small cell lung cancer, adenocarcinoma status post right upper lobectomy with lymph node dissection. Unfortunately the patient was found to have metastatic disease in the jejunum and terminal ileum.  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 The patient was started on treatment with systemic chemotherapy with carboplatin and Alimta for 2 cycles discontinued secondary to intolerance and disease progression. The patient is currently undergoing treatment with second line immunotherapy with Ketruda (pembrolizumab) 200 mg IV every 3 weeks, status post 23 cycles.   He is having decreased appetite, weight loss, and fatigue.  He is not routinely taking his Marinol.  It is not clear if some of the symptoms are related to his treatment versus uncontrolled diabetes. Recommend for the patient to proceed with cycle #24 of his treatment today as scheduled.  He will follow-up in 3 weeks for evaluation prior to cycle #25.  For the weight loss, I have refilled his Marinol.  I have advised him to take this twice a day every day.  The patient's blood sugar is elevated today at 311.  The patient will receive 6 units of regular insulin in our infusion area today.  He has a follow-up visit tomorrow with his primary care provider to discuss his diabetes.  I am not sure if his decreased appetite and weight loss is related to his Keytruda versus his uncontrolled diabetes.  He was advised to call  immediately if he has any concerning symptoms in the interval. The patient voices understanding of current disease status and treatment options and is in agreement with the current care plan. All questions were answered. The patient knows to call the clinic with any problems, questions or concerns. We can certainly see the patient much sooner if necessary.   No orders of the defined types were placed in this encounter.    Mikey Bussing, DNP, AGPCNP-BC, AOCNP 10/02/17

## 2017-10-03 ENCOUNTER — Encounter: Payer: Self-pay | Admitting: Internal Medicine

## 2017-10-03 ENCOUNTER — Telehealth: Payer: Self-pay | Admitting: Internal Medicine

## 2017-10-03 ENCOUNTER — Ambulatory Visit (INDEPENDENT_AMBULATORY_CARE_PROVIDER_SITE_OTHER): Payer: PPO | Admitting: Internal Medicine

## 2017-10-03 VITALS — BP 100/72 | HR 83 | Temp 98.0°F | Wt 108.4 lb

## 2017-10-03 DIAGNOSIS — E1159 Type 2 diabetes mellitus with other circulatory complications: Secondary | ICD-10-CM

## 2017-10-03 DIAGNOSIS — G894 Chronic pain syndrome: Secondary | ICD-10-CM | POA: Diagnosis not present

## 2017-10-03 DIAGNOSIS — Z79899 Other long term (current) drug therapy: Secondary | ICD-10-CM | POA: Diagnosis not present

## 2017-10-03 DIAGNOSIS — Z681 Body mass index (BMI) 19 or less, adult: Secondary | ICD-10-CM

## 2017-10-03 DIAGNOSIS — E440 Moderate protein-calorie malnutrition: Secondary | ICD-10-CM | POA: Diagnosis not present

## 2017-10-03 DIAGNOSIS — E1151 Type 2 diabetes mellitus with diabetic peripheral angiopathy without gangrene: Secondary | ICD-10-CM | POA: Diagnosis not present

## 2017-10-03 DIAGNOSIS — E7800 Pure hypercholesterolemia, unspecified: Secondary | ICD-10-CM

## 2017-10-03 DIAGNOSIS — E785 Hyperlipidemia, unspecified: Secondary | ICD-10-CM

## 2017-10-03 DIAGNOSIS — Z0000 Encounter for general adult medical examination without abnormal findings: Secondary | ICD-10-CM

## 2017-10-03 DIAGNOSIS — Z794 Long term (current) use of insulin: Secondary | ICD-10-CM | POA: Diagnosis not present

## 2017-10-03 DIAGNOSIS — Z720 Tobacco use: Secondary | ICD-10-CM | POA: Diagnosis not present

## 2017-10-03 DIAGNOSIS — Z79891 Long term (current) use of opiate analgesic: Secondary | ICD-10-CM | POA: Diagnosis not present

## 2017-10-03 DIAGNOSIS — I739 Peripheral vascular disease, unspecified: Secondary | ICD-10-CM

## 2017-10-03 DIAGNOSIS — C3491 Malignant neoplasm of unspecified part of right bronchus or lung: Secondary | ICD-10-CM

## 2017-10-03 LAB — GLUCOSE, CAPILLARY: Glucose-Capillary: 257 mg/dL — ABNORMAL HIGH (ref 70–99)

## 2017-10-03 LAB — POCT GLYCOSYLATED HEMOGLOBIN (HGB A1C): HbA1c POC (<> result, manual entry): >14 % — AB (ref 4.0–5.6)

## 2017-10-03 MED ORDER — ACCU-CHEK SOFTCLIX LANCETS MISC: 12 refills | Status: AC

## 2017-10-03 MED ORDER — INSULIN GLARGINE 100 UNIT/ML SOLOSTAR PEN: 10.0000 [IU] | PEN_INJECTOR | Freq: Every day | SUBCUTANEOUS | 3 refills | Status: DC

## 2017-10-03 MED ORDER — OXYCODONE-ACETAMINOPHEN 10-325 MG PO TABS: 1.0000 | ORAL_TABLET | Freq: Four times a day (QID) | ORAL | 0 refills | Status: DC | PRN

## 2017-10-03 MED ORDER — PEN NEEDLES 32G X 4 MM MISC: 1.0000 "pen " | Freq: Every day | 6 refills | Status: DC

## 2017-10-03 MED ORDER — GLUCOSE BLOOD VI STRP: ORAL_STRIP | 12 refills | Status: DC

## 2017-10-03 MED ORDER — ACCU-CHEK AVIVA PLUS W/DEVICE KIT: PACK | 0 refills | Status: DC

## 2017-10-03 NOTE — Telephone Encounter (Signed)
Ulis Rias   Pt wife call with some information, pls return call [phone]

## 2017-10-03 NOTE — Addendum Note (Signed)
Addended by: Marcelino Duster on: 10/03/2017 04:28 PM   Modules accepted: Orders

## 2017-10-03 NOTE — Telephone Encounter (Signed)
rx for new meter, lancet, test strips, and pen needles sent to pharmacy.Brett Hidden Cassady9/13/20194:30 PM

## 2017-10-03 NOTE — Progress Notes (Signed)
   Subjective:    Patient ID: Brett [name], male    DOB: 1960/05/10, 57 y.o.   MRN: [id number]  HPI   Brett [name] is here for follow-up of his type 2 diabetes complicated by vascular disease, tobacco abuse, protein calorie malnutrition, and lung cancer. Please see the A&P for the status of the pt's chronic medical problems.  His only acute complaint is hyperglycemia despite the jardance with polyuria and blurry vision which has worsened since the last visit and continued weight loss (down 12 pounds since the last clinic visit).  He states he simply does not have an appetite when he does not take the Marinol, but admits the Marinol does stimulate his appetite when he takes it.  Review of Systems  Constitutional: Positive for appetite change and unexpected weight change. Negative for activity change.       Appetite remains poor and weight is down 12 pounds in 4-5 months.  Eyes: Positive for visual disturbance.       Blurry vision  Endocrine: Positive for polydipsia and polyuria.  Genitourinary: Positive for frequency and urgency.  Musculoskeletal: Positive for arthralgias and back pain.  Skin: Negative for rash.  Neurological: Positive for weakness.       Generalized weakness      Objective:   Physical Exam  Constitutional: He is oriented to person, place, and time. He appears well-developed. No distress.  Thin  HENT:  Head: Normocephalic.  Eyes: Right eye exhibits no discharge. Left eye exhibits no discharge. No scleral icterus.  Musculoskeletal: He exhibits no edema or deformity.  Neurological: He is alert and oriented to person, place, and time.  Skin: No rash noted. He is not diaphoretic. No erythema. No pallor.  Psychiatric: He has a normal mood and affect. His behavior is normal. Judgment and thought content normal.  Nursing note and vitals reviewed.     Assessment & Plan:   Please see problem based charting.

## 2017-10-03 NOTE — Patient Instructions (Signed)
It was good to see you.  I am glad the cancer is under control.  1) Stop the jardiance.  2) Start lantus 10 units every night.  I will see you back on 10/31/2017 to make further adjustments.  Call me if sugars fall below 150.  Stop all medicines if sugars fall below 80.

## 2017-10-03 NOTE — Assessment & Plan Note (Signed)
Assessment  We again discussed smoking cessation and he remains in the pre-contemplative stage.  Plan  We will continue to ask for his interest in smoking cessation and offer pharmacologic intervention should he be interested at each clinic visit.

## 2017-10-03 NOTE — Assessment & Plan Note (Signed)
He was asked to inquire whether or not he could have a flu vaccination while receiving the [facility] immunotherapy.  If this is permissible, we can provide it at the follow-up visit.

## 2017-10-03 NOTE — Assessment & Plan Note (Signed)
Assessment  His diabetic control has markedly deteriorated since the last clinic visit.  His hemoglobin A1c is above the upper level of detection and measured at greater than 14.  There have been no dietary restrictions as we have been trying to put weight on him and this may be contributing to the hyperglycemia.  In addition, the [place] may also cause hyperglycemia.  Interestingly, 2 years ago, his blood sugars were well controlled with a hemoglobin A1c of 6.4 on metformin 500 mg by mouth twice daily, glipizide 20 mg by mouth twice daily, and Januvia 100 mg by mouth daily.  He was recently placed on Jardiance, and despite this, his glycemic control has markedly deteriorated.  He found that since starting the Jardiance he has had worsening polyuria and diffuse muscle aches.  We discussed the likely requirement of insulin at this point and he was in agreement.  Regarding his vascular disease, his chronic lower extremity claudication is unchanged.  Plan  We are continuing with glipizide 20 mg by mouth twice daily, metformin XR 500 mg by mouth twice daily, and Januvia 100 mg by mouth daily.  We have discontinued the Jardiance given the intolerance.  We are starting Lantus 10 units subcu every evening.  This initial dose is weight-based at 0.2 units/kg.  We will titrate up from here.  He was educated on how to appropriately give himself the insulin therapy.  He will continue to monitor his sugars and call if they drop below 150.  He will stop all oral medications if they drop below 80.  I am not anticipating hypoglycemia given the severity of his hyperglycemia and fully expect that we will have to titrate the Lantus further.  He will be brought back to clinic in 1 month to review his glucose logs and we will make further adjustments in his Lantus dosing at that time.  With regards to his vascular disease we are continuing aggressive management of not only the diabetes but the hypertension and hyperlipidemia.   Unfortunately, he is not interested in smoking cessation at this time.

## 2017-10-03 NOTE — Assessment & Plan Note (Signed)
Assessment  His metastatic adenocarcinoma of the right lung has responded well to the [facility] therapy.  During his most recent surveillance CT scan there was no evidence of progressive disease.  He is tolerating the [facility] well.  Plan  He is to continue with the [place] as directed by the cancer center.  He has been given Percocet 10-325 mg 1 to 2 tablets every 6 hours as needed dispense #240 for the pain associated not only with the metastatic adenocarcinoma, but also for his chronic pain syndrome related to the work accident trauma he experienced in the remote past.  A urine drug screen was obtained today and is pending at the time of this dictation.

## 2017-10-03 NOTE — Assessment & Plan Note (Signed)
Assessment  He continues to suffer from protein calorie malnutrition and is losing weight.  In fact, he is lost 12 pounds since I last saw him in clinic.  He states he simply does not have an appetite.  I suspect some of the weight loss is also related to his very poorly controlled diabetes.  When he remembers to take the Marinol, he states his appetite improves and he will eat.  Plan  He was encouraged to take the Marinol on a more frequent basis.  We have also begin insulin therapy for his diabetes and hope that with better glycemic control over time his weight loss will turn the corner.  We will reassess his weight at the follow-up visit.

## 2017-10-03 NOTE — Assessment & Plan Note (Signed)
Assessment  His lower extremity claudication is symptomatically stable at this time.  Plan  We are continuing aggressive therapy for his diabetes, hypertension, and hyperlipidemia.  Unfortunately, he remains in the pre-contemplative stage with regards to tobacco cessation.  We will continue to offer pharmacotherapy to help with smoking cessation once he moves past the pre-contemplative stage.

## 2017-10-03 NOTE — Assessment & Plan Note (Signed)
Assessment  He denies any myalgias on the atorvastatin 40 mg by mouth daily.  Plan  We will continue with his high intensity statin and reassess for intolerances at the follow-up visit.

## 2017-10-08 ENCOUNTER — Other Ambulatory Visit: Payer: Self-pay | Admitting: Internal Medicine

## 2017-10-08 DIAGNOSIS — K2270 Barrett's esophagus without dysplasia: Secondary | ICD-10-CM

## 2017-10-09 ENCOUNTER — Encounter: Payer: Self-pay | Admitting: Internal Medicine

## 2017-10-09 IMAGING — DX DG CHEST 2V
2 series · 2 of 2 positions shown · non-contrast
Comparison: 11/24/2015 chest radiographs and CTA

CLINICAL DATA: New cough and shortness of breath. Concern for
sepsis. Lobectomy for lung cancer in [DATE].

EXAM:
CHEST  2 VIEW

[chest pa]
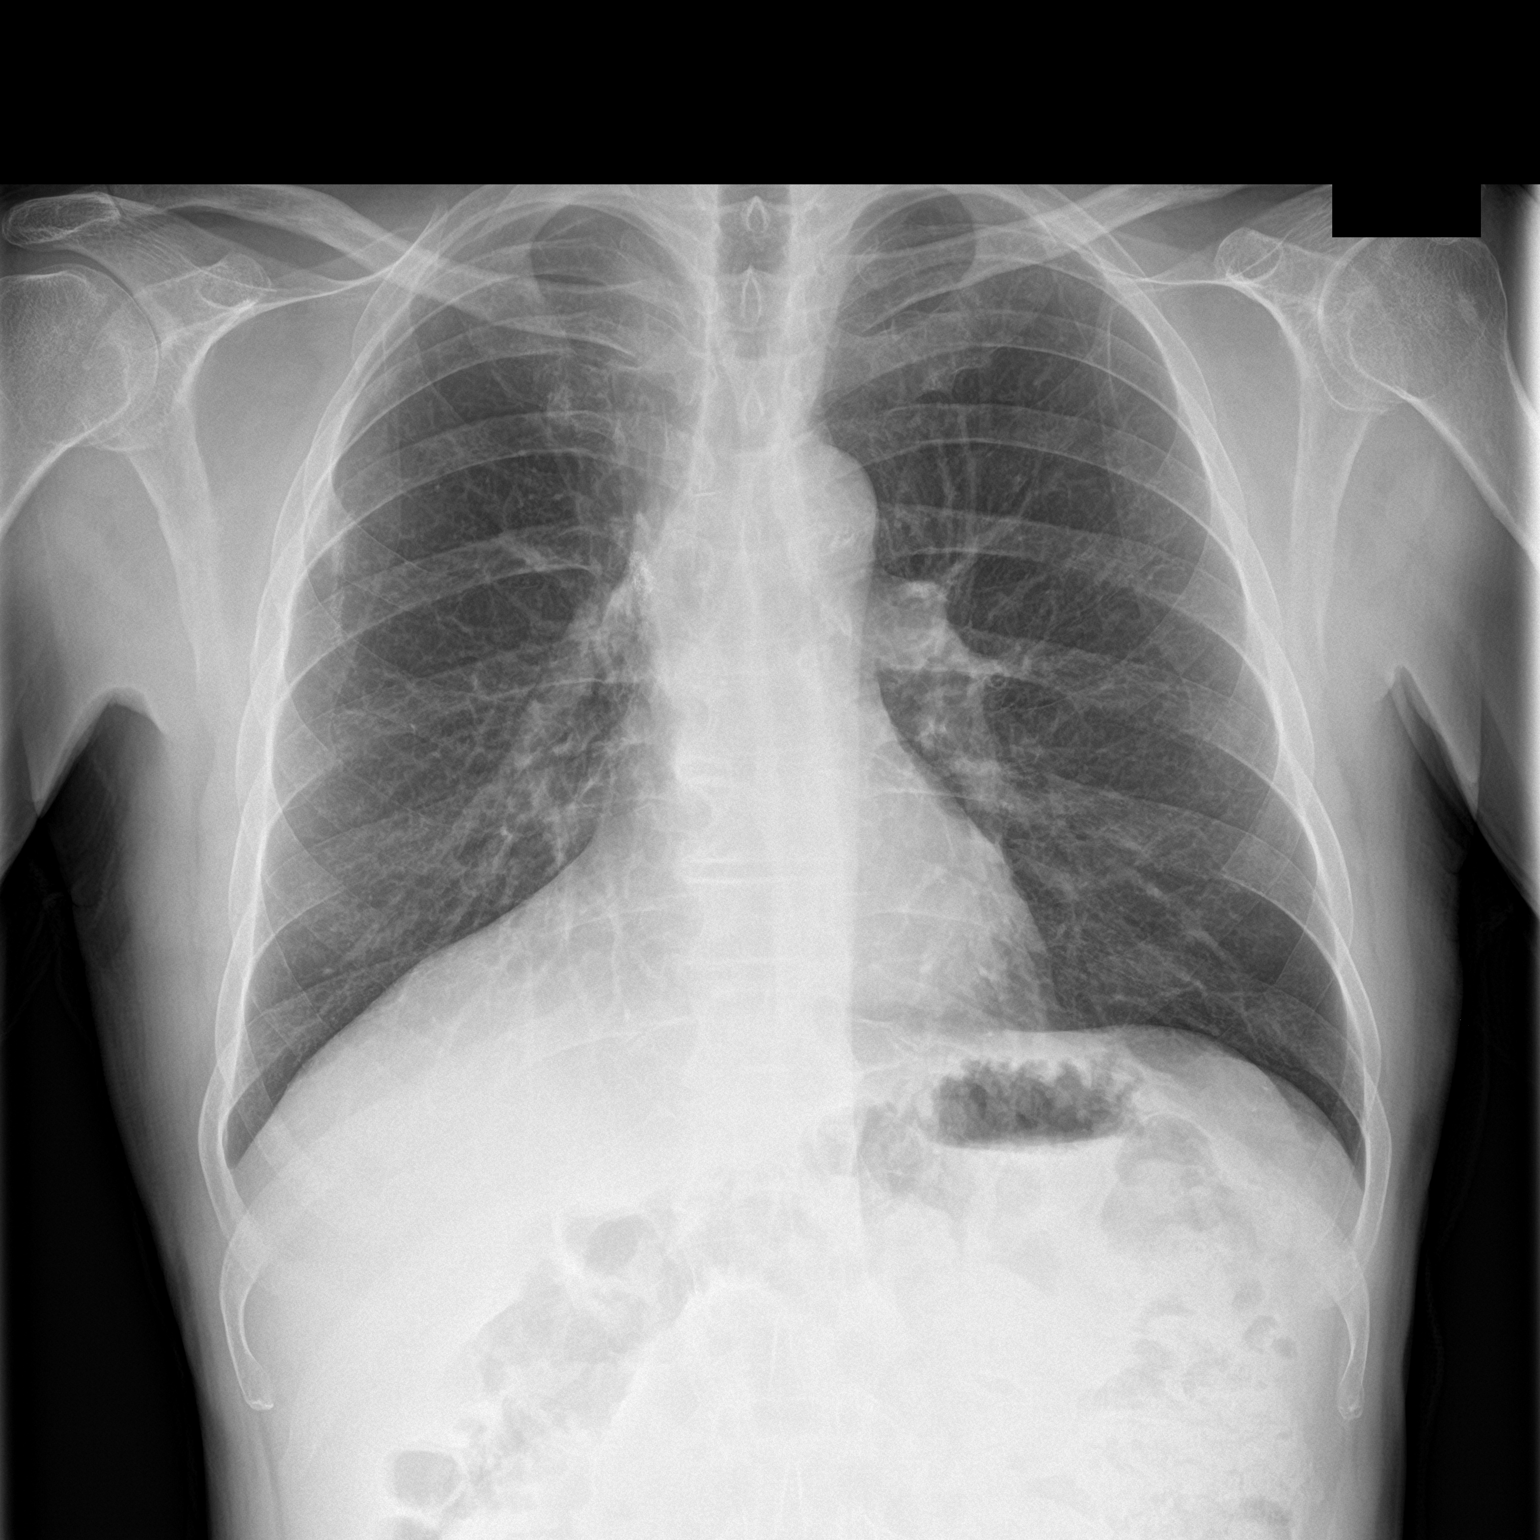

[chest lat]
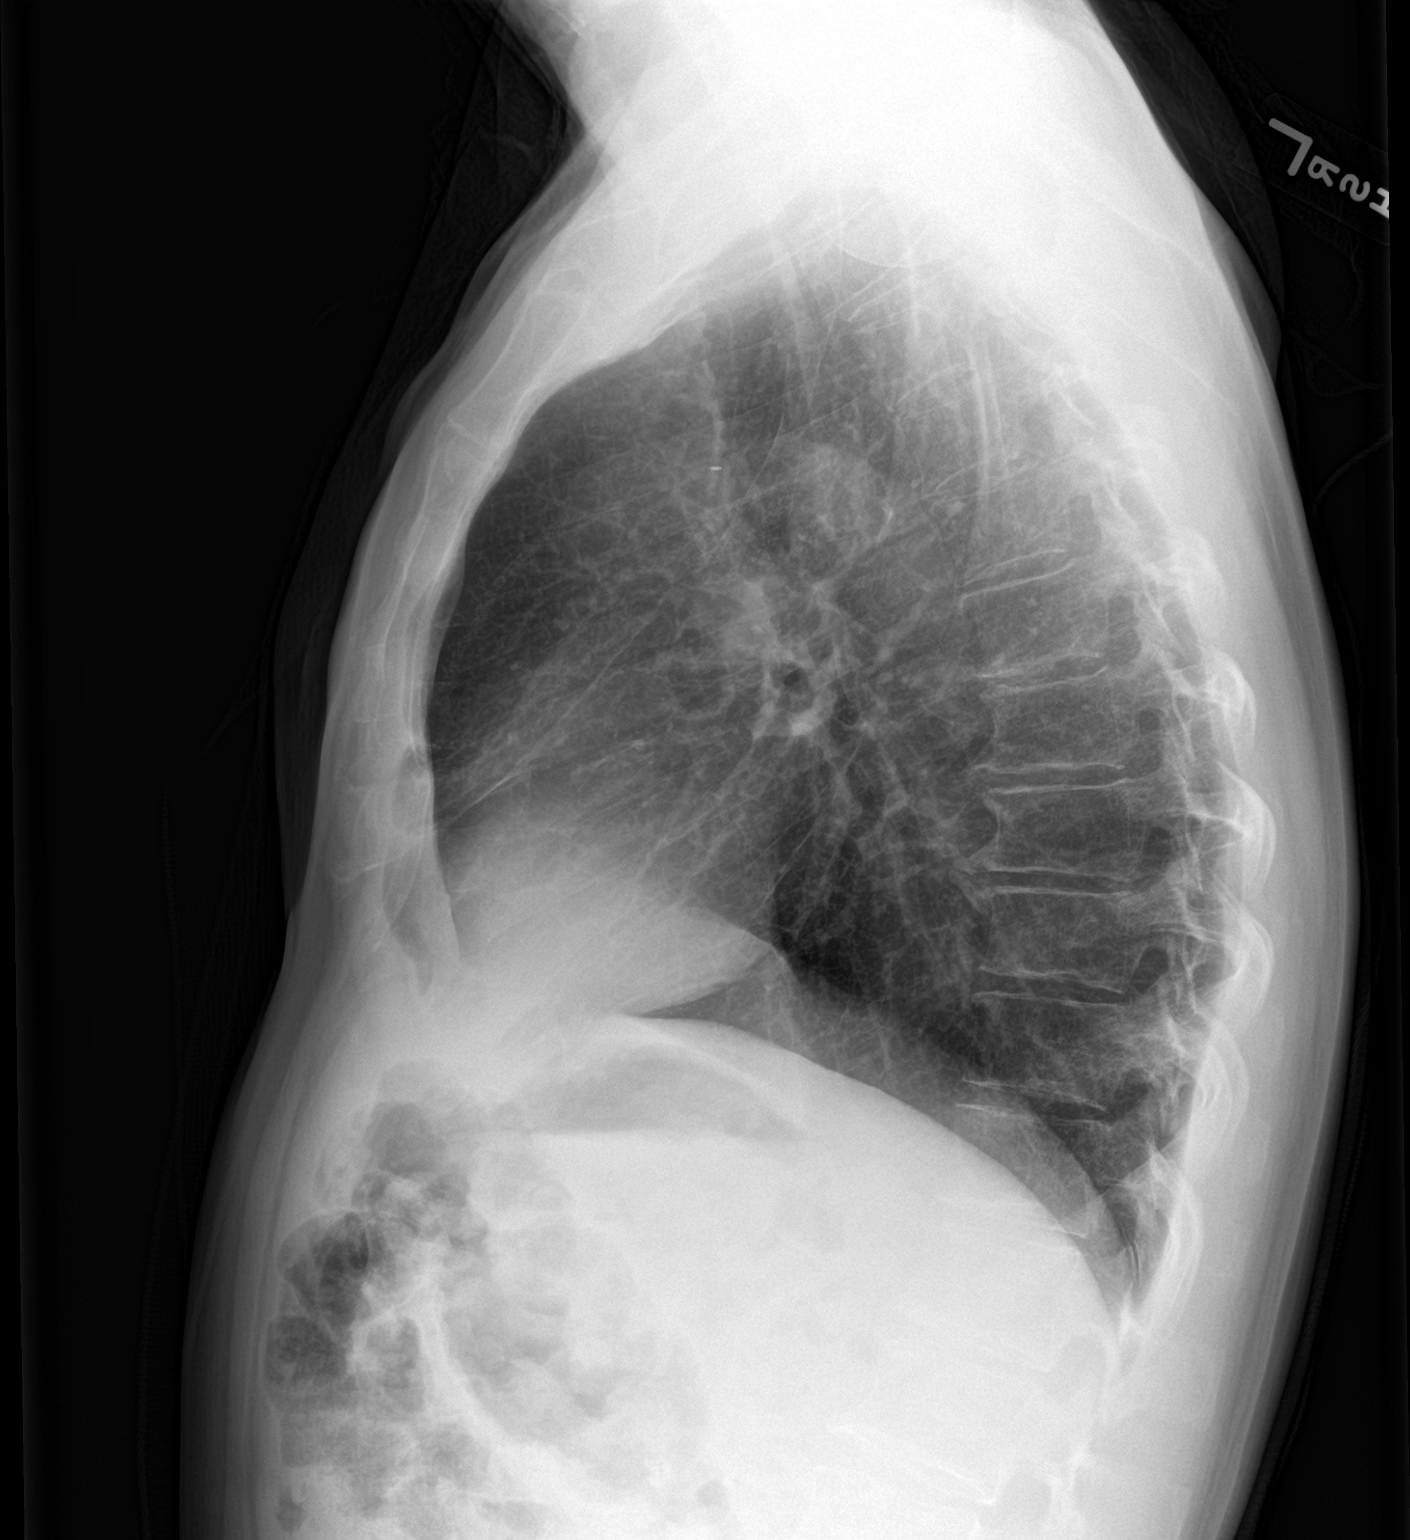

[2 of 2 positions shown; findings below may reference images not displayed]

FINDINGS: The cardiomediastinal silhouette is unchanged. The heart is normal
in size. Sequelae of right upper lobectomy are again identified. No
airspace consolidation, edema, pleural effusion, or pneumothorax is
identified. No acute osseous abnormality is seen.
IMPRESSION: No active cardiopulmonary disease.

## 2017-10-12 LAB — TOXASSURE SELECT,+ANTIDEPR,UR

## 2017-10-22 NOTE — Progress Notes (Signed)
Patient ID: Brett [name], male   DOB: 31-May-1960, 56 y.o.   MRN: [id number]  UDS results as expected with metabolites of oxycodone.

## 2017-10-23 ENCOUNTER — Inpatient Hospital Stay: Payer: PPO

## 2017-10-23 ENCOUNTER — Inpatient Hospital Stay (HOSPITAL_BASED_OUTPATIENT_CLINIC_OR_DEPARTMENT_OTHER): Payer: PPO | Admitting: Internal Medicine

## 2017-10-23 ENCOUNTER — Encounter: Payer: Self-pay | Admitting: Internal Medicine

## 2017-10-23 ENCOUNTER — Inpatient Hospital Stay: Payer: PPO | Attending: Internal Medicine

## 2017-10-23 ENCOUNTER — Other Ambulatory Visit: Payer: Self-pay

## 2017-10-23 ENCOUNTER — Telehealth: Payer: Self-pay | Admitting: Internal Medicine

## 2017-10-23 VITALS — BP 107/74 | HR 86 | Temp 98.0°F | Resp 18 | Ht 67.0 in | Wt 108.7 lb

## 2017-10-23 DIAGNOSIS — C3491 Malignant neoplasm of unspecified part of right bronchus or lung: Secondary | ICD-10-CM

## 2017-10-23 DIAGNOSIS — Z5112 Encounter for antineoplastic immunotherapy: Secondary | ICD-10-CM | POA: Diagnosis not present

## 2017-10-23 DIAGNOSIS — C784 Secondary malignant neoplasm of small intestine: Secondary | ICD-10-CM

## 2017-10-23 DIAGNOSIS — Z79899 Other long term (current) drug therapy: Secondary | ICD-10-CM | POA: Insufficient documentation

## 2017-10-23 DIAGNOSIS — C3411 Malignant neoplasm of upper lobe, right bronchus or lung: Secondary | ICD-10-CM | POA: Diagnosis not present

## 2017-10-23 DIAGNOSIS — C171 Malignant neoplasm of jejunum: Secondary | ICD-10-CM

## 2017-10-23 LAB — CMP (CANCER CENTER ONLY)
ALT: 9 U/L (ref 0–44)
AST: 9 U/L — ABNORMAL LOW (ref 15–41)
Albumin: 3.7 g/dL (ref 3.5–5.0)
Alkaline Phosphatase: 118 U/L (ref 38–126)
Anion gap: 10 (ref 5–15)
BUN: 14 mg/dL (ref 6–20)
CO2: 28 mmol/L (ref 22–32)
Calcium: 9.6 mg/dL (ref 8.9–10.3)
Chloride: 102 mmol/L (ref 98–111)
Creatinine: 0.86 mg/dL (ref 0.61–1.24)
GFR, Est AFR Am: >60 mL/min (ref >60)
GFR, Estimated: >60 mL/min (ref >60)
Glucose, Bld: 321 mg/dL — ABNORMAL HIGH (ref 70–99)
Potassium: 4.3 mmol/L (ref 3.5–5.1)
Sodium: 140 mmol/L (ref 135–145)
Total Bilirubin: 0.4 mg/dL (ref 0.3–1.2)
Total Protein: 7.2 g/dL (ref 6.5–8.1)

## 2017-10-23 LAB — CBC WITH DIFFERENTIAL (CANCER CENTER ONLY)
Basophils Absolute: 0.1 10*3/uL (ref 0.0–0.1)
Basophils Relative: 1 %
Eosinophils Absolute: 0.3 10*3/uL (ref 0.0–0.5)
Eosinophils Relative: 5 %
HCT: 43.8 % (ref 38.4–49.9)
Hemoglobin: 14.5 g/dL (ref 13.0–17.1)
Lymphocytes Relative: 16 %
Lymphs Abs: 1.1 10*3/uL (ref 0.9–3.3)
MCH: 30.1 pg (ref 27.2–33.4)
MCHC: 33.2 g/dL (ref 32.0–36.0)
MCV: 90.8 fL (ref 79.3–98.0)
Monocytes Absolute: 0.6 10*3/uL (ref 0.1–0.9)
Monocytes Relative: 9 %
Neutro Abs: 4.9 10*3/uL (ref 1.5–6.5)
Neutrophils Relative %: 69 %
Platelet Count: 203 10*3/uL (ref 140–400)
RBC: 4.83 MIL/uL (ref 4.20–5.82)
RDW: 14.2 % (ref 11.0–14.6)
WBC Count: 7.1 10*3/uL (ref 4.0–10.3)

## 2017-10-23 LAB — TSH: TSH: 2.572 u[IU]/mL (ref 0.320–4.118)

## 2017-10-23 MED ORDER — SODIUM CHLORIDE 0.9 % IV SOLN
Freq: Once | INTRAVENOUS | Status: AC
  Administered 2017-10-23: 10:00:00 via INTRAVENOUS
  Filled 2017-10-23: qty 250

## 2017-10-23 MED ORDER — SODIUM CHLORIDE 0.9 % IV SOLN
200.0000 mg | Freq: Once | INTRAVENOUS | Status: AC
  Administered 2017-10-23: 200 mg via INTRAVENOUS
  Filled 2017-10-23: qty 8

## 2017-10-23 MED ORDER — SODIUM CHLORIDE 0.9% FLUSH
10.0000 mL | INTRAVENOUS | Status: DC | PRN
  Administered 2017-10-23: 10 mL
  Filled 2017-10-23: qty 10

## 2017-10-23 MED ORDER — HEPARIN SOD (PORK) LOCK FLUSH 100 UNIT/ML IV SOLN
500.0000 [IU] | Freq: Once | INTRAVENOUS | Status: AC | PRN
  Administered 2017-10-23: 500 [IU]
  Filled 2017-10-23: qty 5

## 2017-10-23 NOTE — Progress Notes (Signed)
[facility] [phone]   [phone]  OFFICE PROGRESS NOTE  [name], MD [address] [address]  DIAGNOSIS: Stage IV (T1b, N0, M1b) non-small cell lung cancer, adenocarcinoma presented with right upper lobe lung nodule and recent metastasis to the small intestine. This was initially diagnosed in September 2017.  Genomic Alterations Identified? ERBB2 amplification - equivocal? CDKN2A p16INK4a E88* and p14ARF N027O SMARCA4 splice site [id number] SPTA1 E2022* TOP2A amplification TP53 A159P Additional Findings? Microsatellite status MS-Stable Tumor Mutation Burden TMB-Intermediate; 18 Muts/Mb Additional Disease-relevant Genes with No Reportable Alterations Identified? EGFR KRAS ALK BRAF MET RET ROS1   PRIOR THERAPY:  1) Status post right VATS with right upper lobectomy and mediastinal lymph node dissection under the care of Dr. Roxan Hockey on 10/18/2015 and the final pathology was consistent with stage IA (T1b, N0, MX). 2) upper endoscopy on 01/05/2016 showed normal esophagus, normal stomach but there was occasional mass around 3.0 CM in length circumferential nonobstructing in the jejunum. The final pathology was consistent with metastatic adenocarcinoma. 3) status post laparoscopic laparotomy and resection of proximal lesion and and distal jejunum/proximal ileum under the care of Dr. Hassell Done 1 02/27/2016. 3)  Systemic chemotherapy with carboplatin for AUC of 5 and Alimta 500 MG/M2 every 3 weeks. First dose 04/04/2016. Status post 2 cycles. Last dose was given 04/21/2016 discontinued secondary to disease progression.   CURRENT THERAPY: Second line immunotherapy with Ketruda 200 mg IV every 2 weeks, first dose 05/30/2016. Status post 24 cycles.  INTERVAL HISTORY: Brett [name] 57 y.o. male returns to the clinic today for follow-up visit accompanied by his wife.  The patient is feeling fine today with no concerning  complaints.  He denied having any chest pain, shortness breath, cough or hemoptysis.  He denied having any fever or chills.  He denied having any significant nausea, vomiting, diarrhea or constipation.  He continues to tolerate his treatment with Keytruda fairly well.  He was seen recently by his primary care physician and his A1c was significantly elevated.  He was started on insulin injection in addition to his other current antidiabetic medication.  The patient is here today for evaluation before starting cycle #25.   MEDICAL HISTORY: Past Medical History:  Diagnosis Date  . Barrett's esophagus 07/01/2013   Without dysplasia on biopsy 09/03/2012. Repeat EGD recommended 08/2015  . Bilateral cataracts 02/13/2017  . Carotid artery stenosis 07/01/2013   Requiring right sided stent   . Chronic pain syndrome 07/01/2013  . Closed head injury with brief loss of consciousness ([place]) 07/22/2010   Head trapped in a hydraulic device at work.  Fracture of orbital bones on right and brief loss of consciousness per report.  . 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 There was likely a strong psycho-social overlay in regard to the cognitive deficits in the form of mood disorder with psychotic features and mixed anxiety symptomatology. His primary tested cognitive deficits are in the areas of attention, executi  . Daily headache "since 07/2010"   constantly  . Degenerative joint disease of cervical spine 07/01/2013  . Dupuytren's contracture of both hands 04/08/2014  . Encounter for antineoplastic chemotherapy 10/05/2015  . Encounter for antineoplastic immunotherapy 05/24/2016  . Erectile dysfunction associated with type 2 diabetes mellitus ([place]) 07/01/2013  . Fibromyalgia 07/01/2013  . Goals of care, counseling/discussion 03/28/2016  . History of  blood transfusion 11/28/2015    "suppose to get his first today" (11/28/2015)  . Hyperlipidemia LDL goal < 100 07/01/2013  . Intractable hiccups 04/11/2016  . 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 Memory changes    "memory issues" from head injury  . Moderate protein-calorie malnutrition ([place]) 11/29/2015  . Osteoarthritis of right thumb 10/21/2014  . Peripheral vascular occlusive disease ([place]) 07/01/2013   Requiring 2 arterial stents above the left knee per report  . Pneumonia ~ 2006/2007  . Post traumatic stress disorder 07/01/2013  . Primary lung adenocarcinoma ([place]) dx'd 08/2015   "right lung"  . Severe major depression with psychotic features ([place]) 04/15/2011  . Tobacco abuse 07/01/2013  . Type 2 diabetes mellitus with vascular disease ([place]) 07/01/2013   Left lower extremity and right carotid stenting    ALLERGIES:  is allergic to gabapentin; lyrica [pregabalin]; celebrex [celecoxib]; and contrast media [iodinated diagnostic agents].  MEDICATIONS:  Current Outpatient Medications  Medication Sig Dispense Refill  . ACCU-CHEK SOFTCLIX LANCETS lancets Use to test blood glucose 1-2 times daily. Dx Code E11.59 100 each 12  . aspirin EC 81 MG tablet Take 81 mg by mouth daily.     Marland Kitchen atorvastatin (LIPITOR) 40 MG tablet Take 1 tablet (40 mg total) by mouth daily. 90 tablet 3  . Blood Glucose Monitoring Suppl (ACCU-CHEK AVIVA PLUS) w/Device KIT Use to test blood glucose 1-2 times daily. Dx Code E11.59 1 kit 0  . cyclobenzaprine (FLEXERIL) 10 MG tablet Take 1 tablet (10 mg total) by mouth 3 (three) times daily as needed for muscle spasms. 90 tablet 11  . diphenhydrAMINE (BENADRYL) 25 mg capsule Take 2 capsules (50 mg total) by mouth as directed. Take prior to CT scan as directed 30 capsule 0  . dronabinol (MARINOL) 2.5 MG capsule Take 1 capsule (2.5 mg total) by mouth 2 (two) times daily before a meal. 60 capsule 0  . erythromycin ophthalmic ointment Place 1 application into both eyes 2 (two) times daily.  1  . glipiZIDE  (GLUCOTROL) 10 MG tablet Take 2 tablets (20 mg total) by mouth 2 (two) times daily before a meal. 360 tablet 3  . glucose blood (ACCU-CHEK AVIVA) test strip Use to test blood glucose 1-2 times daily. Dx Code E11.59 100 each 12  . hydrOXYzine (ATARAX/VISTARIL) 10 MG tablet Take 1 tablet (10 mg total) by mouth 3 (three) times daily as needed for itching. 30 tablet 0  . Insulin Glargine (LANTUS) 100 UNIT/ML Solostar Pen Inject 10 Units into the skin daily. 15 mL 3  . Insulin Pen Needle (PEN NEEDLES) 32G X 4 MM MISC 1 pen by Does not apply route at bedtime. Use to inject lantus once daily at bedtime Dx Code E11.59 100 each 6  . lidocaine-prilocaine (EMLA) cream Apply 1 application topically as needed. 30 g 0  . lisinopril (PRINIVIL,ZESTRIL) 5 MG tablet Take 0.5 tablets (2.5 mg total) by mouth daily. 90 tablet 3  . metFORMIN (GLUCOPHAGE-XR) 500 MG 24 hr tablet Take 1 tablet (500 mg total) by mouth 2 (two) times daily. 180 tablet 3  . metoCLOPramide (REGLAN) 10 MG tablet TAKE 1 TABLET BY MOUTH FOUR TIMES DAILY AS NEEDED AS DIRECTED (Patient not taking: Reported on 10/02/2017) 30 tablet 0  . ondansetron (ZOFRAN) 4 MG tablet Take 1 tablet (4 mg total) by mouth 3 (three) times daily. 20 tablet 0  . [START ON 12/11/2017] oxyCODONE-acetaminophen (PERCOCET) 10-325 MG tablet Take 1-2 tablets by mouth every 6 (six) hours as needed for  pain. 240 tablet 0  . pantoprazole (PROTONIX) 40 MG tablet TAKE 1 TABLET(40 MG) BY MOUTH DAILY 90 tablet 1  . predniSONE (DELTASONE) 50 MG tablet Take 1 tablet (50 mg total) by mouth as directed. Take 50 mg 13 hours. 7 hours and 1 hour prior to scan 3 tablet 2  . prochlorperazine (COMPAZINE) 10 MG tablet Take 10 mg by mouth every 6 (six) hours as needed for nausea or vomiting.    . prochlorperazine (COMPAZINE) 25 MG suppository Place 1 suppository (25 mg total) rectally every 12 (twelve) hours as needed for refractory nausea / vomiting. 12 suppository 11  . PROLENSA 0.07 % SOLN Place 1  drop into both eyes 2 (two) times daily.  0  . senna-docusate (SENOKOT-S) 8.6-50 MG tablet Take 1-2 tablets by mouth 2 (two) times daily as needed for mild constipation or moderate constipation. 30 tablet 1  . sitaGLIPtin (JANUVIA) 100 MG tablet Take 1 tablet (100 mg total) by mouth daily. 90 tablet 3  . sorbitol 70 % solution Take 15 cc every 4 hours until bowel movement (Patient not taking: Reported on 10/02/2017) 473 mL 0   No current facility-administered medications for this visit.     SURGICAL HISTORY:  Past Surgical History:  Procedure Laterality Date  . CAROTID STENT Right ?2014  . COLONOSCOPY N/A 11/30/2015   Procedure: COLONOSCOPY;  Surgeon: Teena Irani, MD;  Location: [facility] ENDOSCOPY;  Service: Endoscopy;  Laterality: N/A;  . ESOPHAGOGASTRODUODENOSCOPY N/A 11/30/2015   Procedure: ESOPHAGOGASTRODUODENOSCOPY (EGD);  Surgeon: Teena Irani, MD;  Location[facility] ENDOSCOPY;  Service: Endoscopy;  Laterality: N/A;  . ESOPHAGOGASTRODUODENOSCOPY (EGD) WITH PROPOFOL N/A 01/05/2016   Procedure: ESOPHAGOGASTRODUODENOSCOPY (EGD) WITH PROPOFOL;  Surgeon: Teena Irani, MD;  Location: [facility] ENDOSCOPY;  Service: Endoscopy;  Laterality: N/A;  . FEMORAL ARTERY STENT Left 05/2012; ~ 2015   Archie Endo 06/04/2012; Raechel Chute report  . FRACTURE SURGERY    . GIVENS CAPSULE STUDY N/A 12/22/2015   Procedure: GIVENS CAPSULE STUDY;  Surgeon: Wonda Horner, MD;  Location: [facility] ENDOSCOPY;  Service: Endoscopy;  Laterality: N/A;  . HARDWARE REMOVAL Right 11/15/2011   Removal of deep frontozygomatic orbital hardware/notes 11/15/2011  . HERNIA REPAIR  4034   Umbilical  . LAPAROSCOPY N/A 02/27/2016   Procedure: LAPAROSCOPY, LAPAROTOMY  WITH TWO SMALL BOWEL RESECTION;  Surgeon: Johnathan Hausen, MD;  Location: [facility] ORS;  Service: General;  Laterality: N/A;  . ORIF ORBITAL FRACTURE Right 08/15/2010    caught in a hydraulic machine; open reduction internal fixation of orbital rim fracture and open reduction of zygomatic arch fracture  Archie Endo  10/13/2009  . PORTACATH PLACEMENT Left 04/04/2016   Procedure: INSERTION PORT-A-CATH LEFT CHEST;  Surgeon: Melrose Nakayama, MD;  Location: [facility];  Service: Thoracic;  Laterality: Left;  Marland Kitchen VIDEO ASSISTED THORACOSCOPY (VATS)/ LOBECTOMY Right 10/18/2015   Procedure: VIDEO ASSISTED THORACOSCOPY (VATS)/ LOBECTOMY;  Surgeon: Melrose Nakayama, MD;  Location: [facility];  Service: Thoracic;  Laterality: Right;  Marland Kitchen VIDEO BRONCHOSCOPY Bilateral 09/21/2015   Procedure: VIDEO BRONCHOSCOPY WITH FLUORO;  Surgeon: Juanito Doom, MD;  Location: [facility] ENDOSCOPY;  Service: Cardiopulmonary;  Laterality: Bilateral;    REVIEW OF SYSTEMS:  A comprehensive review of systems was negative except for: Constitutional: positive for fatigue   PHYSICAL EXAMINATION: General appearance: alert, cooperative and no distress Head: Normocephalic, without obvious abnormality, atraumatic Neck: no adenopathy, no JVD, supple, symmetrical, trachea midline and thyroid not enlarged, symmetric, no tenderness/mass/nodules Lymph nodes: Cervical, supraclavicular, and axillary nodes normal. Resp: clear to auscultation bilaterally Back:  symmetric, no curvature. ROM normal. No CVA tenderness. Cardio: regular rate and rhythm, S1, S2 normal, no murmur, click, rub or gallop GI: soft, non-tender; bowel sounds normal; no masses,  no organomegaly Extremities: extremities normal, atraumatic, no cyanosis or edema  ECOG PERFORMANCE STATUS: 1 - Symptomatic but completely ambulatory  Blood pressure 107/74, pulse 86, temperature 98 F (36.7 C), temperature source Oral, resp. rate 18, height 5' 7" (1.702 m), weight 108 lb 11.2 oz (49.3 kg), SpO2 100 %.  LABORATORY DATA: Lab Results  Component Value Date   WBC 7.1 10/23/2017   HGB 14.5 10/23/2017   HCT 43.8 10/23/2017   MCV 90.8 10/23/2017   PLT 203 10/23/2017      Chemistry      Component Value Date/Time   NA 138 10/02/2017 0840   NA 140 01/23/2017 0910   K 4.5 10/02/2017 0840   K 4.5  01/23/2017 0910   CL 101 10/02/2017 0840   CO2 25 10/02/2017 0840   CO2 28 01/23/2017 0910   BUN 16 10/02/2017 0840   BUN 8.1 01/23/2017 0910   CREATININE 0.94 10/02/2017 0840   CREATININE 0.8 01/23/2017 0910   GLU 398 (H) 08/08/2016 1257      Component Value Date/Time   CALCIUM 10.4 (H) 10/02/2017 0840   CALCIUM 9.6 01/23/2017 0910   ALKPHOS 137 (H) 10/02/2017 0840   ALKPHOS 113 01/23/2017 0910   AST 11 (L) 10/02/2017 0840   AST 11 01/23/2017 0910   ALT 9 10/02/2017 0840   ALT 12 01/23/2017 0910   BILITOT 0.5 10/02/2017 0840   BILITOT 0.40 01/23/2017 0910       RADIOGRAPHIC STUDIES: No results found.   ASSESSMENT AND PLAN:  This is a very pleasant 57 years old white male with metastatic non-small cell lung cancer, adenocarcinoma status post right upper lobectomy with lymph node dissection. Unfortunately the patient was found to have metastatic disease in the jejunum and terminal ileum.  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 The patient was started on treatment with systemic chemotherapy with carboplatin and Alimta for 2 cycles discontinued secondary to intolerance and disease progression. The patient is currently undergoing treatment with second line immunotherapy with Ketruda (pembrolizumab) 200 mg IV every 3 weeks, status post 24 cycles. The patient continues to tolerate this treatment well with no concerning adverse effects. I recommended for him to proceed with cycle #25 today as scheduled. I will see him back for follow-up visit in 3 weeks for evaluation before starting cycle #26. We will arrange his restaging scan to be done after cycle #26. He was advised to call immediately if he has any concerning symptoms in the interval. The patient voices understanding of current disease status and treatment options and is in agreement with the  current care plan. All questions were answered. The patient knows to call the clinic with any problems, questions or concerns. We can certainly see the patient much sooner if necessary.  Disclaimer: This note was dictated with voice recognition software. Similar sounding words can inadvertently be transcribed and may not be corrected upon review.

## 2017-10-23 NOTE — Telephone Encounter (Signed)
Appts already scheduled per 10/3 los.  No additional appt added./

## 2017-10-23 NOTE — Patient Instructions (Signed)
Pembrolizumab injection  What is this medicine?  PEMBROLIZUMAB (pem broe liz ue mab) is a monoclonal antibody. It is used to treat melanoma, head and neck cancer, Hodgkin lymphoma, non-small cell lung cancer, urothelial cancer, stomach cancer, and cancers that have a certain genetic condition.  This medicine may be used for other purposes; ask your health care provider or pharmacist if you have questions.  COMMON BRAND NAME(S): Keytruda  What should I tell my health care provider before I take this medicine?  They need to know if you have any of these conditions:  -diabetes  -immune system problems  -inflammatory bowel disease  -liver disease  -lung or breathing disease  -lupus  -organ transplant  -an unusual or allergic reaction to pembrolizumab, other medicines, foods, dyes, or preservatives  -pregnant or trying to get pregnant  -breast-feeding  How should I use this medicine?  This medicine is for infusion into a vein. It is given by a health care professional in a hospital or clinic setting.  A special MedGuide will be given to you before each treatment. Be sure to read this information carefully each time.  Talk to your pediatrician regarding the use of this medicine in children. While this drug may be prescribed for selected conditions, precautions do apply.  Overdosage: If you think you have taken too much of this medicine contact a poison control center or emergency room at once.  NOTE: This medicine is only for you. Do not share this medicine with others.  What if I miss a dose?  It is important not to miss your dose. Call your doctor or health care professional if you are unable to keep an appointment.  What may interact with this medicine?  Interactions have not been studied.  Give your health care provider a list of all the medicines, herbs, non-prescription drugs, or dietary supplements you use. Also tell them if you smoke, drink alcohol, or use illegal drugs. Some items may interact with your  medicine.  This list may not describe all possible interactions. Give your health care provider a list of all the medicines, herbs, non-prescription drugs, or dietary supplements you use. Also tell them if you smoke, drink alcohol, or use illegal drugs. Some items may interact with your medicine.  What should I watch for while using this medicine?  Your condition will be monitored carefully while you are receiving this medicine.  You may need blood work done while you are taking this medicine.  Do not become pregnant while taking this medicine or for 4 months after stopping it. Women should inform their doctor if they wish to become pregnant or think they might be pregnant. There is a potential for serious side effects to an unborn child. Talk to your health care professional or pharmacist for more information. Do not breast-feed an infant while taking this medicine or for 4 months after the last dose.  What side effects may I notice from receiving this medicine?  Side effects that you should report to your doctor or health care professional as soon as possible:  -allergic reactions like skin rash, itching or hives, swelling of the face, lips, or tongue  -bloody or black, tarry  -breathing problems  -changes in vision  -chest pain  -chills  -constipation  -cough  -dizziness or feeling faint or lightheaded  -fast or irregular heartbeat  -fever  -flushing  -hair loss  -low blood counts - this medicine may decrease the number of white blood cells, red blood cells   and platelets. You may be at increased risk for infections and bleeding.  -muscle pain  -muscle weakness  -persistent headache  -signs and symptoms of high blood sugar such as dizziness; dry mouth; dry skin; fruity breath; nausea; stomach pain; increased hunger or thirst; increased urination  -signs and symptoms of kidney injury like trouble passing urine or change in the amount of urine  -signs and symptoms of liver injury like dark urine, light-colored  stools, loss of appetite, nausea, right upper belly pain, yellowing of the eyes or skin  -stomach pain  -sweating  -weight loss  Side effects that usually do not require medical attention (report to your doctor or health care professional if they continue or are bothersome):  -decreased appetite  -diarrhea  -tiredness  This list may not describe all possible side effects. Call your doctor for medical advice about side effects. You may report side effects to FDA at 1-800-FDA-1088.  Where should I keep my medicine?  This drug is given in a hospital or clinic and will not be stored at home.  NOTE: This sheet is a summary. It may not cover all possible information. If you have questions about this medicine, talk to your doctor, pharmacist, or health care provider.   2018 Elsevier/Gold Standard (2015-10-17 12:29:36)

## 2017-10-26 ENCOUNTER — Other Ambulatory Visit: Payer: Self-pay | Admitting: Internal Medicine

## 2017-10-26 DIAGNOSIS — E1159 Type 2 diabetes mellitus with other circulatory complications: Secondary | ICD-10-CM

## 2017-10-31 ENCOUNTER — Ambulatory Visit (INDEPENDENT_AMBULATORY_CARE_PROVIDER_SITE_OTHER): Payer: PPO | Admitting: Internal Medicine

## 2017-10-31 ENCOUNTER — Encounter: Payer: Self-pay | Admitting: Pharmacist

## 2017-10-31 ENCOUNTER — Encounter: Payer: Self-pay | Admitting: Internal Medicine

## 2017-10-31 VITALS — BP 104/64 | HR 78 | Temp 97.9°F | Wt 110.0 lb

## 2017-10-31 DIAGNOSIS — E1159 Type 2 diabetes mellitus with other circulatory complications: Secondary | ICD-10-CM | POA: Diagnosis not present

## 2017-10-31 DIAGNOSIS — Z681 Body mass index (BMI) 19 or less, adult: Secondary | ICD-10-CM

## 2017-10-31 DIAGNOSIS — E1136 Type 2 diabetes mellitus with diabetic cataract: Secondary | ICD-10-CM | POA: Diagnosis not present

## 2017-10-31 DIAGNOSIS — H269 Unspecified cataract: Secondary | ICD-10-CM | POA: Diagnosis not present

## 2017-10-31 DIAGNOSIS — Z794 Long term (current) use of insulin: Secondary | ICD-10-CM | POA: Diagnosis not present

## 2017-10-31 DIAGNOSIS — E440 Moderate protein-calorie malnutrition: Secondary | ICD-10-CM | POA: Diagnosis not present

## 2017-10-31 LAB — GLUCOSE, CAPILLARY: Glucose-Capillary: 233 mg/dL — ABNORMAL HIGH (ref 70–99)

## 2017-10-31 MED ORDER — INSULIN ASPART 100 UNIT/ML FLEXPEN: 10.0000 [IU] | PEN_INJECTOR | SUBCUTANEOUS | 0 refills | Status: DC

## 2017-10-31 MED ORDER — INSULIN GLARGINE 100 UNIT/ML SOLOSTAR PEN: 15.0000 [IU] | PEN_INJECTOR | Freq: Every day | SUBCUTANEOUS | 3 refills | Status: DC

## 2017-10-31 NOTE — Assessment & Plan Note (Signed)
Assessment  Review of his meter download reveals that most of his fasting sugars in the morning were in the mid 200s, the lowest being 184.  His evening sugars 1 to 2 hours after dinner are a different story and were mainly in the 400-500 range.  He was tolerating the Lantus 10 units sub-q nightly without difficulty and had no hypoglycemic episodes.  Plan  We will increase the Lantus to 15 units subcu every morning and add a mealtime NovoLog 10 units subcu prior to dinner.  He will continue to closely monitor his blood sugars as well as take his metformin, glipizide, and Januvia at the current doses.  He will return at my next available appointment and we will review his glucose readings and make further adjustments in his regimen.  Ultimately, I would like to control his diabetes and begin to wean off the glipizide.

## 2017-10-31 NOTE — Patient Instructions (Signed)
It was great to see you again.  I appreciate all of the data you provided.  1) Increase your lantus insulin to 15 units in the morning.  2) Start novolog 10 units just before dinner.  3) Keep measuring your sugars.  4) If sugar below 150 stop oral diabetes pills and call me.  I will see you at my next available appointment.

## 2017-10-31 NOTE — Assessment & Plan Note (Signed)
Assessment  He is up 4 pounds in the last month since our last visit.  I suspect most of this is related to better glycemic control and a decrease in his osmotic diuresis.  That said, he and his wife both feel his appetite has improved and therefore some of this weight gain may truly be secondary to increased oral nutritional intake.  Plan  We will continue to make adjustments in his insulin regimen in hopes of achieving better glycemic control.  He was encouraged to continue with the as needed Marinol and eat what he felt he could.  We will reassess his weight at the follow-up visit.

## 2017-10-31 NOTE — Progress Notes (Signed)
   Subjective:    Patient ID: SIMRAN MANNIS, male    DOB: [birth year], 57 y.o.   MRN: [id number]  HPI  ULRIC SALZMAN is here for follow-up of his type 2 diabetes after initiating lantus insulin at the last visit. Please see the A&P for the status of the pt's chronic medical problems.  Review of Systems  Constitutional: Positive for appetite change. Negative for unexpected weight change.       His appetite has improved and he has gained about 2 pounds.  Eyes: Positive for visual disturbance.       Visual disturbance more from his cataract rather than from blurry vision.      Objective:   Physical Exam  Constitutional: He is oriented to person, place, and time. He appears well-developed. No distress.  HENT:  Head: Normocephalic.  Eyes: Right eye exhibits no discharge. Left eye exhibits no discharge. No scleral icterus.  Neurological: He is alert and oriented to person, place, and time.  Skin: Skin is warm and dry. He is not diaphoretic.  Psychiatric: He has a normal mood and affect. His behavior is normal. Judgment and thought content normal.  Nursing note and vitals reviewed.     Assessment & Plan:   Please see problem based charting.

## 2017-11-03 ENCOUNTER — Telehealth: Payer: Self-pay | Admitting: Pharmacist

## 2017-11-03 ENCOUNTER — Other Ambulatory Visit: Payer: Self-pay | Admitting: Internal Medicine

## 2017-11-03 NOTE — Telephone Encounter (Signed)
Pt's wife would like a call back about the patient diabetes A1 and medications

## 2017-11-03 NOTE — Telephone Encounter (Signed)
Returned Ms. Lanphere call: she reports blood sugar as follows: 136 Saturday morning after taking 15 units Lantus,  Saturday 352 (evening), Sunday 255 fasting, Sunday evening 468, did not start Novolog( pharmacy did not have it and they were hesitant) , went back to 10 units lantus because the 136 blood sugar scared them.  This am 212 took orals   After our conversation she agreed to do the following: start 10 units Novolog tonight, take schedule oral diabetes medicines,  then move Lantus to 15 units tomorrow morning. I told her I would notify Dr. Eppie Gibson and gave her my direct extension for further support.

## 2017-11-06 NOTE — Progress Notes (Signed)
Tried contacting patient to assist with medication disposal, unable to reach.

## 2017-11-07 ENCOUNTER — Telehealth: Payer: Self-pay | Admitting: Internal Medicine

## 2017-11-07 NOTE — Telephone Encounter (Signed)
MM PAL 11/14 - appointments moved to 11/13 with [facility]. Spoke with wife re change and new date/time.

## 2017-11-13 ENCOUNTER — Other Ambulatory Visit: Payer: Self-pay

## 2017-11-13 ENCOUNTER — Inpatient Hospital Stay (HOSPITAL_BASED_OUTPATIENT_CLINIC_OR_DEPARTMENT_OTHER): Payer: PPO | Admitting: Internal Medicine

## 2017-11-13 ENCOUNTER — Telehealth: Payer: Self-pay | Admitting: Internal Medicine

## 2017-11-13 ENCOUNTER — Encounter: Payer: Self-pay | Admitting: Internal Medicine

## 2017-11-13 ENCOUNTER — Inpatient Hospital Stay: Payer: PPO

## 2017-11-13 VITALS — BP 125/74 | HR 88 | Temp 97.8°F | Resp 18 | Ht 67.0 in | Wt 109.9 lb

## 2017-11-13 DIAGNOSIS — C3491 Malignant neoplasm of unspecified part of right bronchus or lung: Secondary | ICD-10-CM

## 2017-11-13 DIAGNOSIS — C784 Secondary malignant neoplasm of small intestine: Secondary | ICD-10-CM | POA: Diagnosis not present

## 2017-11-13 DIAGNOSIS — C171 Malignant neoplasm of jejunum: Secondary | ICD-10-CM

## 2017-11-13 DIAGNOSIS — C3411 Malignant neoplasm of upper lobe, right bronchus or lung: Secondary | ICD-10-CM | POA: Diagnosis not present

## 2017-11-13 DIAGNOSIS — C3490 Malignant neoplasm of unspecified part of unspecified bronchus or lung: Secondary | ICD-10-CM

## 2017-11-13 DIAGNOSIS — Z5112 Encounter for antineoplastic immunotherapy: Secondary | ICD-10-CM | POA: Diagnosis not present

## 2017-11-13 LAB — CMP (CANCER CENTER ONLY)
ALT: 11 U/L (ref 0–44)
AST: 12 U/L — ABNORMAL LOW (ref 15–41)
Albumin: 4.1 g/dL (ref 3.5–5.0)
Alkaline Phosphatase: 130 U/L — ABNORMAL HIGH (ref 38–126)
Anion gap: 14 (ref 5–15)
BUN: 10 mg/dL (ref 6–20)
CO2: 24 mmol/L (ref 22–32)
Calcium: 9.4 mg/dL (ref 8.9–10.3)
Chloride: 103 mmol/L (ref 98–111)
Creatinine: 0.82 mg/dL (ref 0.61–1.24)
GFR, Est AFR Am: >60 mL/min (ref >60)
GFR, Estimated: >60 mL/min (ref >60)
Glucose, Bld: 323 mg/dL — ABNORMAL HIGH (ref 70–99)
Potassium: 4.1 mmol/L (ref 3.5–5.1)
Sodium: 141 mmol/L (ref 135–145)
Total Bilirubin: 0.4 mg/dL (ref 0.3–1.2)
Total Protein: 7.7 g/dL (ref 6.5–8.1)

## 2017-11-13 LAB — CBC WITH DIFFERENTIAL (CANCER CENTER ONLY)
Abs Immature Granulocytes: 0.04 10*3/uL (ref 0.00–0.07)
Basophils Absolute: 0.1 10*3/uL (ref 0.0–0.1)
Basophils Relative: 1 %
Eosinophils Absolute: 0.4 10*3/uL (ref 0.0–0.5)
Eosinophils Relative: 6 %
HCT: 46.2 % (ref 39.0–52.0)
Hemoglobin: 14.9 g/dL (ref 13.0–17.0)
Immature Granulocytes: 1 %
Lymphocytes Relative: 26 %
Lymphs Abs: 1.7 10*3/uL (ref 0.7–4.0)
MCH: 30 pg (ref 26.0–34.0)
MCHC: 32.3 g/dL (ref 30.0–36.0)
MCV: 93 fL (ref 80.0–100.0)
Monocytes Absolute: 0.5 10*3/uL (ref 0.1–1.0)
Monocytes Relative: 8 %
Neutro Abs: 3.7 10*3/uL (ref 1.7–7.7)
Neutrophils Relative %: 58 %
Platelet Count: 203 10*3/uL (ref 150–400)
RBC: 4.97 MIL/uL (ref 4.22–5.81)
RDW: 13.5 % (ref 11.5–15.5)
WBC Count: 6.4 10*3/uL (ref 4.0–10.5)
nRBC: 0 % (ref 0.0–0.2)

## 2017-11-13 LAB — TSH: TSH: 4.137 u[IU]/mL — ABNORMAL HIGH (ref 0.320–4.118)

## 2017-11-13 MED ORDER — SODIUM CHLORIDE 0.9% FLUSH
10.0000 mL | INTRAVENOUS | Status: DC | PRN
  Administered 2017-11-13: 10 mL
  Filled 2017-11-13: qty 10

## 2017-11-13 MED ORDER — SODIUM CHLORIDE 0.9 % IV SOLN
200.0000 mg | Freq: Once | INTRAVENOUS | Status: AC
  Administered 2017-11-13: 200 mg via INTRAVENOUS
  Filled 2017-11-13: qty 8

## 2017-11-13 MED ORDER — SODIUM CHLORIDE 0.9 % IV SOLN
Freq: Once | INTRAVENOUS | Status: AC
  Administered 2017-11-13: 12:00:00 via INTRAVENOUS
  Filled 2017-11-13: qty 250

## 2017-11-13 MED ORDER — HEPARIN SOD (PORK) LOCK FLUSH 100 UNIT/ML IV SOLN
500.0000 [IU] | Freq: Once | INTRAVENOUS | Status: AC | PRN
  Administered 2017-11-13: 500 [IU]
  Filled 2017-11-13: qty 5

## 2017-11-13 NOTE — Progress Notes (Signed)
[facility] Telephone:([phone]) [phone]   [phone]  OFFICE PROGRESS NOTE  [name], MD [address] [place] [address]  DIAGNOSIS: Stage IV (T1b, N0, M1b) non-small cell lung cancer, adenocarcinoma presented with right upper lobe lung nodule and recent metastasis to the small intestine. This was initially diagnosed in September 2017.  Genomic Alterations Identified? ERBB2 amplification - equivocal? CDKN2A p16INK4a E88* and p14ARF C163A SMARCA4 splice site [id number]>YY SPTA1 E2022* TOP2A amplification TP53 A159P Additional Findings? Microsatellite status MS-Stable Tumor Mutation Burden TMB-Intermediate; 18 Muts/Mb Additional Disease-relevant Genes with No Reportable Alterations Identified? EGFR KRAS ALK BRAF MET RET ROS1   PRIOR THERAPY:  1) Status post right VATS with right upper lobectomy and mediastinal lymph node dissection under the care of Dr. Roxan Hockey on 10/18/2015 and the final pathology was consistent with stage IA (T1b, N0, MX). 2) upper endoscopy on 01/05/2016 showed normal esophagus, normal stomach but there was occasional mass around 3.0 CM in length circumferential nonobstructing in the jejunum. The final pathology was consistent with metastatic adenocarcinoma. 3) status post laparoscopic laparotomy and resection of proximal lesion and and distal jejunum/proximal ileum under the care of Dr. Hassell Done 1 02/27/2016. 3)  Systemic chemotherapy with carboplatin for AUC of 5 and Alimta 500 MG/M2 every 3 weeks. First dose 04/04/2016. Status post 2 cycles. Last dose was given 04/21/2016 discontinued secondary to disease progression.   CURRENT THERAPY: Second line immunotherapy with Ketruda 200 mg IV every 2 weeks, first dose 05/30/2016. Status post 25 cycles.  INTERVAL HISTORY: Brett [name] 57 y.o. male returns to the clinic today for follow-up visit.  The patient is feeling fine today with no concerning complaints.  He denied  having any chest pain, shortness of breath, cough or hemoptysis.  He denied having any fever or chills.  He has no nausea, vomiting, diarrhea or constipation.  His blood sugar still uncontrolled.  He takes insulin under the care of his primary care physician.  He denied having any fever or chills.  He has no significant weight loss or night sweats.  He continues to tolerate his treatment with Keytruda fairly well.  He is here for evaluation before starting cycle #26.  MEDICAL HISTORY: Past Medical History:  Diagnosis Date  . Barrett's esophagus 07/01/2013   Without dysplasia on biopsy 09/03/2012. Repeat EGD recommended 08/2015  . Bilateral cataracts 02/13/2017  . Carotid artery stenosis 07/01/2013   Requiring right sided stent   . Chronic pain syndrome 07/01/2013  . Closed head injury with brief loss of consciousness ([place]) 07/22/2010   Head trapped in a hydraulic device at work.  Fracture of orbital bones on right and brief loss of consciousness per report.  . 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 There was likely a strong psycho-social overlay in regard to the cognitive deficits in the form of mood disorder with psychotic features and mixed anxiety symptomatology. His primary tested cognitive deficits are in the areas of attention, executi  . Daily headache "since 07/2010"   constantly  . Degenerative joint disease of cervical spine 07/01/2013  . Dupuytren's contracture of both hands 04/08/2014  . Encounter for antineoplastic chemotherapy 10/05/2015  . Encounter for antineoplastic immunotherapy 05/24/2016  . Erectile dysfunction associated with type 2 diabetes mellitus ([place]) 07/01/2013  . Fibromyalgia 07/01/2013  . Goals of care, counseling/discussion 03/28/2016  . History of blood transfusion 11/28/2015   "  suppose to get his first today" (11/28/2015)  .  Hyperlipidemia LDL goal < 100 07/01/2013  . Intractable hiccups 04/11/2016  . 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 Memory changes    "memory issues" from head injury  . Moderate protein-calorie malnutrition ([place]) 11/29/2015  . Osteoarthritis of right thumb 10/21/2014  . Peripheral vascular occlusive disease ([place]) 07/01/2013   Requiring 2 arterial stents above the left knee per report  . Pneumonia ~ 2006/2007  . Post traumatic stress disorder 07/01/2013  . Primary lung adenocarcinoma ([place]) dx'd 08/2015   "right lung"  . Severe major depression with psychotic features ([place]) 04/15/2011  . Tobacco abuse 07/01/2013  . Type 2 diabetes mellitus with vascular disease ([place]) 07/01/2013   Left lower extremity and right carotid stenting    ALLERGIES:  is allergic to gabapentin; lyrica [pregabalin]; celebrex [celecoxib]; and contrast media [iodinated diagnostic agents].  MEDICATIONS:  Current Outpatient Medications  Medication Sig Dispense Refill  . ACCU-CHEK SOFTCLIX LANCETS lancets Use to test blood glucose 1-2 times daily. Dx Code E11.59 100 each 12  . aspirin EC 81 MG tablet Take 81 mg by mouth daily.     Marland Kitchen atorvastatin (LIPITOR) 40 MG tablet Take 1 tablet (40 mg total) by mouth daily. 90 tablet 3  . Blood Glucose Monitoring Suppl (ACCU-CHEK AVIVA PLUS) w/Device KIT Use to test blood glucose 1-2 times daily. Dx Code E11.59 1 kit 0  . cyclobenzaprine (FLEXERIL) 10 MG tablet Take 1 tablet (10 mg total) by mouth 3 (three) times daily as needed for muscle spasms. 90 tablet 11  . diphenhydrAMINE (BENADRYL) 25 mg capsule Take 2 capsules (50 mg total) by mouth as directed. Take prior to CT scan as directed 30 capsule 0  . dronabinol (MARINOL) 2.5 MG capsule Take 1 capsule (2.5 mg total) by mouth 2 (two) times daily before a meal. 60 capsule 0  . erythromycin ophthalmic ointment Place 1 application into both eyes 2 (two) times daily.  1  . glipiZIDE (GLUCOTROL) 10 MG tablet Take 2 tablets (20 mg total) by  mouth 2 (two) times daily before a meal. 360 tablet 3  . glucose blood (ACCU-CHEK AVIVA) test strip Use to test blood glucose 1-2 times daily. Dx Code E11.59 100 each 12  . hydrOXYzine (ATARAX/VISTARIL) 10 MG tablet Take 1 tablet (10 mg total) by mouth 3 (three) times daily as needed for itching. 30 tablet 0  . insulin aspart (NOVOLOG) 100 UNIT/ML FlexPen Inject 10 Units into the skin daily. Just before dinner 5 pen 0  . Insulin Glargine (LANTUS) 100 UNIT/ML Solostar Pen Inject 15 Units into the skin daily. 15 mL 3  . Insulin Pen Needle (PEN NEEDLES) 32G X 4 MM MISC 1 pen by Does not apply route at bedtime. Use to inject lantus once daily at bedtime Dx Code E11.59 100 each 6  . lidocaine-prilocaine (EMLA) cream Apply 1 application topically as needed. 30 g 0  . lisinopril (PRINIVIL,ZESTRIL) 5 MG tablet Take 0.5 tablets (2.5 mg total) by mouth daily. 90 tablet 3  . metFORMIN (GLUCOPHAGE-XR) 500 MG 24 hr tablet Take 1 tablet (500 mg total) by mouth 2 (two) times daily. 180 tablet 3  . metoCLOPramide (REGLAN) 10 MG tablet TAKE 1 TABLET BY MOUTH FOUR TIMES DAILY AS NEEDED AS DIRECTED (Patient not taking: Reported on 10/02/2017) 30 tablet 0  . ondansetron (ZOFRAN) 4 MG tablet Take 1 tablet (4 mg total) by mouth 3 (three) times daily. 20 tablet 0  . [START ON 12/11/2017] oxyCODONE-acetaminophen (  PERCOCET) 10-325 MG tablet Take 1-2 tablets by mouth every 6 (six) hours as needed for pain. 240 tablet 0  . pantoprazole (PROTONIX) 40 MG tablet TAKE 1 TABLET(40 MG) BY MOUTH DAILY 90 tablet 1  . prochlorperazine (COMPAZINE) 10 MG tablet Take 10 mg by mouth every 6 (six) hours as needed for nausea or vomiting.    . prochlorperazine (COMPAZINE) 25 MG suppository Place 1 suppository (25 mg total) rectally every 12 (twelve) hours as needed for refractory nausea / vomiting. 12 suppository 11  . PROLENSA 0.07 % SOLN Place 1 drop into both eyes 2 (two) times daily.  0  . senna-docusate (SENOKOT-S) 8.6-50 MG tablet Take  1-2 tablets by mouth 2 (two) times daily as needed for mild constipation or moderate constipation. 30 tablet 1  . sitaGLIPtin (JANUVIA) 100 MG tablet Take 1 tablet (100 mg total) by mouth daily. 90 tablet 3  . sorbitol 70 % solution Take 15 cc every 4 hours until bowel movement (Patient not taking: Reported on 10/02/2017) 473 mL 0   No current facility-administered medications for this visit.     SURGICAL HISTORY:  Past Surgical History:  Procedure Laterality Date  . CAROTID STENT Right ?2014  . COLONOSCOPY N/A 11/30/2015   Procedure: COLONOSCOPY;  Surgeon: Teena Irani, MD;  Location: [facility] ENDOSCOPY;  Service: Endoscopy;  Laterality: N/A;  . ESOPHAGOGASTRODUODENOSCOPY N/A 11/30/2015   Procedure: ESOPHAGOGASTRODUODENOSCOPY (EGD);  Surgeon: Teena Irani, MD;  Location: [facility] ENDOSCOPY;  Service: Endoscopy;  Laterality: N/A;  . ESOPHAGOGASTRODUODENOSCOPY (EGD) WITH PROPOFOL N/A 01/05/2016   Procedure: ESOPHAGOGASTRODUODENOSCOPY (EGD) WITH PROPOFOL;  Surgeon: Teena Irani, MD;  Location: [facility] ENDOSCOPY;  Service: Endoscopy;  Laterality: N/A;  . FEMORAL ARTERY STENT Left 05/2012; ~ 2015   Archie Endo 06/04/2012; Raechel Chute report  . FRACTURE SURGERY    . GIVENS CAPSULE STUDY N/A 12/22/2015   Procedure: GIVENS CAPSULE STUDY;  Surgeon: Wonda Horner, MD;  Location: [facility] ENDOSCOPY;  Service: Endoscopy;  Laterality: N/A;  . HARDWARE REMOVAL Right 11/15/2011   Removal of deep frontozygomatic orbital hardware/notes 11/15/2011  . HERNIA REPAIR  3716   Umbilical  . LAPAROSCOPY N/A 02/27/2016   Procedure: LAPAROSCOPY, LAPAROTOMY  WITH TWO SMALL BOWEL RESECTION;  Surgeon: Johnathan Hausen, MD;  Location: [facility] ORS;  Service: General;  Laterality: N/A;  . ORIF ORBITAL FRACTURE Right 08/15/2010    caught in a hydraulic machine; open reduction internal fixation of orbital rim fracture and open reduction of zygomatic arch fracture  Archie Endo 10/13/2009  . PORTACATH PLACEMENT Left 04/04/2016   Procedure: INSERTION PORT-A-CATH LEFT CHEST;   Surgeon: Melrose Nakayama, MD;  Location: [place];  Service: Thoracic;  Laterality: Left;  Marland Kitchen VIDEO ASSISTED THORACOSCOPY (VATS)/ LOBECTOMY Right 10/18/2015   Procedure: VIDEO ASSISTED THORACOSCOPY (VATS)/ LOBECTOMY;  Surgeon: Melrose Nakayama, MD;  Location: [place];  Service: Thoracic;  Laterality: Right;  Marland Kitchen VIDEO BRONCHOSCOPY Bilateral 09/21/2015   Procedure: VIDEO BRONCHOSCOPY WITH FLUORO;  Surgeon: Juanito Doom, MD;  Location: [facility] ENDOSCOPY;  Service: Cardiopulmonary;  Laterality: Bilateral;    REVIEW OF SYSTEMS:  A comprehensive review of systems was negative except for: Constitutional: positive for fatigue   PHYSICAL EXAMINATION: General appearance: alert, cooperative and no distress Head: Normocephalic, without obvious abnormality, atraumatic Neck: no adenopathy, no JVD, supple, symmetrical, trachea midline and thyroid not enlarged, symmetric, no tenderness/mass/nodules Lymph nodes: Cervical, supraclavicular, and axillary nodes normal. Resp: clear to auscultation bilaterally Back: symmetric, no curvature. ROM normal. No CVA tenderness. Cardio: regular rate and rhythm, S1, S2 normal, no  murmur, click, rub or gallop GI: soft, non-tender; bowel sounds normal; no masses,  no organomegaly Extremities: extremities normal, atraumatic, no cyanosis or edema  ECOG PERFORMANCE STATUS: 1 - Symptomatic but completely ambulatory  Blood pressure 125/74, pulse 88, temperature 97.8 F (36.6 C), temperature source Oral, resp. rate 18, height _0  (1.702 m), weight 109 lb 14.4 oz (49.9 kg), SpO2 100 %.  LABORATORY DATA: Lab Results  Component Value Date   WBC 6.4 11/13/2017   HGB 14.9 11/13/2017   HCT 46.2 11/13/2017   MCV 93.0 11/13/2017   PLT 203 11/13/2017      Chemistry      Component Value Date/Time   NA 140 10/23/2017 0905   NA 140 01/23/2017 0910   K 4.3 10/23/2017 0905   K 4.5 01/23/2017 0910   CL 102 10/23/2017 0905   CO2 28 10/23/2017 0905   CO2 28 01/23/2017 0910    BUN 14 10/23/2017 0905   BUN 8.1 01/23/2017 0910   CREATININE 0.86 10/23/2017 0905   CREATININE 0.8 01/23/2017 0910   GLU 398 (H) 08/08/2016 1257      Component Value Date/Time   CALCIUM 9.6 10/23/2017 0905   CALCIUM 9.6 01/23/2017 0910   ALKPHOS 118 10/23/2017 0905   ALKPHOS 113 01/23/2017 0910   AST 9 (L) 10/23/2017 0905   AST 11 01/23/2017 0910   ALT 9 10/23/2017 0905   ALT 12 01/23/2017 0910   BILITOT 0.4 10/23/2017 0905   BILITOT 0.40 01/23/2017 0910       RADIOGRAPHIC STUDIES: No results found.   ASSESSMENT AND PLAN:  This is a very pleasant 57 years old white male with metastatic non-small cell lung cancer, adenocarcinoma status post right upper lobectomy with lymph node dissection. Unfortunately the patient was found to have metastatic disease in the jejunum and terminal ileum.  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 The patient was started on treatment with systemic chemotherapy with carboplatin and Alimta for 2 cycles discontinued secondary to intolerance and disease progression. The patient is currently undergoing treatment with second line immunotherapy with Ketruda (pembrolizumab) 200 mg IV every 3 weeks, status post 25 cycles. The patient continues to tolerate this treatment well with no concerning adverse effects. I recommended for him to proceed with cycle #26 today as scheduled. I will see the patient back for follow-up visit in 3 weeks for evaluation after repeating CT scan of the chest, abdomen and pelvis for restaging of his disease. The patient was advised to call immediately if he has any concerning symptoms in the interval. The patient voices understanding of current disease status and treatment options and is in agreement with the current care plan. All questions were answered. The patient knows to call the clinic with  any problems, questions or concerns. We can certainly see the patient much sooner if necessary.  Disclaimer: This note was dictated with voice recognition software. Similar sounding words can inadvertently be transcribed and may not be corrected upon review.

## 2017-11-13 NOTE — Patient Instructions (Signed)
[facility] Discharge Instructions for Patients Receiving Chemotherapy  Today you received the following chemotherapy agents:  Keytruda.  To help prevent nausea and vomiting after your treatment, we encourage you to take your nausea medication as directed.   If you develop nausea and vomiting that is not controlled by your nausea medication, call the clinic.   BELOW ARE SYMPTOMS THAT SHOULD BE REPORTED IMMEDIATELY:  *FEVER GREATER THAN 100.5 F  *CHILLS WITH OR WITHOUT FEVER  NAUSEA AND VOMITING THAT IS NOT CONTROLLED WITH YOUR NAUSEA MEDICATION  *UNUSUAL SHORTNESS OF BREATH  *UNUSUAL BRUISING OR BLEEDING  TENDERNESS IN MOUTH AND THROAT WITH OR WITHOUT PRESENCE OF ULCERS  *URINARY PROBLEMS  *BOWEL PROBLEMS  UNUSUAL RASH Items with * indicate a potential emergency and should be followed up as soon as possible.  Feel free to call the clinic should you have any questions or concerns. The clinic phone number is [phone].  Please show the CHEMO ALERT CARD at check-in to the Emergency Department and triage nurse.    

## 2017-11-13 NOTE — Progress Notes (Signed)
Dr. Julien Nordmann made aware of patient's blood glucose of 323. No new orders received.

## 2017-11-13 NOTE — Telephone Encounter (Signed)
3 cycles already scheduled per 10/24 los - no additional appts added. Central radiology to contact patient with scan

## 2017-11-28 ENCOUNTER — Encounter: Payer: Self-pay | Admitting: Internal Medicine

## 2017-11-28 ENCOUNTER — Ambulatory Visit (INDEPENDENT_AMBULATORY_CARE_PROVIDER_SITE_OTHER): Payer: PPO | Admitting: Internal Medicine

## 2017-11-28 VITALS — BP 91/66 | HR 96 | Temp 97.8°F | Wt 111.7 lb

## 2017-11-28 DIAGNOSIS — G629 Polyneuropathy, unspecified: Secondary | ICD-10-CM | POA: Diagnosis not present

## 2017-11-28 DIAGNOSIS — H538 Other visual disturbances: Secondary | ICD-10-CM | POA: Diagnosis not present

## 2017-11-28 DIAGNOSIS — F17200 Nicotine dependence, unspecified, uncomplicated: Secondary | ICD-10-CM

## 2017-11-28 DIAGNOSIS — E1159 Type 2 diabetes mellitus with other circulatory complications: Secondary | ICD-10-CM

## 2017-11-28 DIAGNOSIS — E7800 Pure hypercholesterolemia, unspecified: Secondary | ICD-10-CM

## 2017-11-28 DIAGNOSIS — Z79899 Other long term (current) drug therapy: Secondary | ICD-10-CM

## 2017-11-28 DIAGNOSIS — E1151 Type 2 diabetes mellitus with diabetic peripheral angiopathy without gangrene: Secondary | ICD-10-CM | POA: Diagnosis not present

## 2017-11-28 DIAGNOSIS — Z794 Long term (current) use of insulin: Secondary | ICD-10-CM

## 2017-11-28 DIAGNOSIS — R21 Rash and other nonspecific skin eruption: Secondary | ICD-10-CM | POA: Diagnosis not present

## 2017-11-28 DIAGNOSIS — Z681 Body mass index (BMI) 19 or less, adult: Secondary | ICD-10-CM | POA: Diagnosis not present

## 2017-11-28 DIAGNOSIS — E440 Moderate protein-calorie malnutrition: Secondary | ICD-10-CM

## 2017-11-28 DIAGNOSIS — E785 Hyperlipidemia, unspecified: Secondary | ICD-10-CM | POA: Diagnosis not present

## 2017-11-28 DIAGNOSIS — Z23 Encounter for immunization: Secondary | ICD-10-CM

## 2017-11-28 DIAGNOSIS — Z0000 Encounter for general adult medical examination without abnormal findings: Secondary | ICD-10-CM

## 2017-11-28 DIAGNOSIS — E1140 Type 2 diabetes mellitus with diabetic neuropathy, unspecified: Secondary | ICD-10-CM

## 2017-11-28 DIAGNOSIS — I739 Peripheral vascular disease, unspecified: Secondary | ICD-10-CM

## 2017-11-28 MED ORDER — CYANOCOBALAMIN 1000 MCG/ML IJ SOLN
1000.0000 ug | Freq: Once | INTRAMUSCULAR | Status: AC
  Administered 2017-11-28: 1000 ug via INTRAMUSCULAR

## 2017-11-28 MED ORDER — INSULIN GLARGINE 100 UNIT/ML SOLOSTAR PEN: 20.0000 [IU] | PEN_INJECTOR | Freq: Every day | SUBCUTANEOUS | 3 refills | Status: DC

## 2017-11-28 MED ORDER — INSULIN ASPART 100 UNIT/ML FLEXPEN: 20.0000 [IU] | PEN_INJECTOR | SUBCUTANEOUS | 1 refills | Status: DC

## 2017-11-28 NOTE — Patient Instructions (Addendum)
It was great to see you today as always!  1) Increase your lantus insulin to 20 units once daily in the morning.  2) Increase your novolog insulin to 20 units once daily before dinner.  3) We gave you the flu shot today.  4) We gave you a vitamin B12 shot today.  5) Keep taking all of your medications as you have been doing (we only changed the insulin).  6) I will see you back on December 08, 2017 at 10:15 AM to make further adjustments.

## 2017-11-28 NOTE — Assessment & Plan Note (Signed)
Assessment  His neuropathy is currently unchanged despite the previous vitamin B12 shot given for a low B12 level with an elevated MMA.  Plan  He was given another vitamin B12 shot 1000 mcg IM.  We will reassess his neuropathic symptoms at the follow-up visit.

## 2017-11-28 NOTE — Progress Notes (Signed)
   Subjective:    Patient ID: Brett [name], male    DOB: 01-18-1961, 57 y.o.   MRN: [id number]  HPI  Brett [name] is here for follow-up and further adjustments in his insulin regimen for his diabetes. Please see the A&P for the status of the pt's chronic medical problems.  He has been taking his insulin as directed and measuring his sugars twice daily.  He continues to have blurry vision and polyuria, although the polyuria is improved.  His appetite continues to improve and he has put on 1.7 pounds over the last month.  He had an episode of transient numbness and weakness after sitting on a hard chair last week.  He denies any pain or dizziness and has not had a similar episode before or since.  This sounds most consistent with a leg "falling asleep".  Review of Systems  Constitutional: Positive for appetite change. Negative for activity change and unexpected weight change.       Improved appetite  Eyes: Positive for visual disturbance.  Respiratory: Negative for chest tightness, shortness of breath and wheezing.   Cardiovascular: Negative for chest pain, palpitations and leg swelling.  Gastrointestinal: Negative for abdominal pain.  Endocrine: Positive for polydipsia.  Musculoskeletal: Positive for arthralgias and back pain. Negative for joint swelling and myalgias.  Skin: Positive for rash.       Left forearm  Neurological: Positive for numbness. Negative for dizziness, syncope, weakness and light-headedness.  Psychiatric/Behavioral: Positive for decreased concentration. Negative for suicidal ideas.      Objective:   Physical Exam  Constitutional: He appears well-developed. No distress.  Eyes: Conjunctivae are normal. Right eye exhibits no discharge. Left eye exhibits no discharge. No scleral icterus.  Musculoskeletal: Normal range of motion. He exhibits no edema, tenderness or deformity.  Neurological: He exhibits normal muscle tone.  Skin: Skin is warm and dry. Rash noted. He is  not diaphoretic.  Erythematous patch on left forearm.  Psychiatric: He has a normal mood and affect. His behavior is normal. Judgment and thought content normal.  Nursing note and vitals reviewed.     Assessment & Plan:   Please see problem based charting.

## 2017-11-28 NOTE — Assessment & Plan Note (Signed)
Upon review of the recommendations of the [place] it was felt that he is a candidate for the influenza vaccine.  He is currently in between treatments with his next dose scheduled for the middle of next week.  I discussed recommendations with the patient and his wife and we have provided the flu vaccination today.  We are holding on providing him with a Pneumovax until he completes his cancer therapy.  He is otherwise up-to-date on his healthcare maintenance.

## 2017-11-28 NOTE — Assessment & Plan Note (Signed)
Assessment  He is tolerating the atorvastatin 40 mg by mouth daily without myalgias.  Plan  We will continue the atorvastatin 40 mg by mouth daily and reassess for intolerances at the follow-up visit.

## 2017-11-28 NOTE — Assessment & Plan Note (Signed)
Assessment  He states his appetite has improved.  This is supported by the elevated blood sugars despite our recent increase in the dose of his insulin at the last visit.  In addition, his weight is up 1.7 pounds although his BMI remains under 18.  Plan  He was encouraged to continue to eat as much as his appetite dictates despite his diabetic control as gaining weight is felt to be important as he continues with his cancer therapy.  We will reassess his weight at the follow-up visit.

## 2017-11-28 NOTE — Assessment & Plan Note (Signed)
Assessment  He notes continued blurry vision and polyuria although the polyuria is improved.  He was able to articulate the Lantus and NovoLog doses and dosing schedule.  He states he has been following these dosing schedules religiously.  In addition, we downloaded his glucose meter results and reviewed them together.  He had 53 readings with an average glucose level of 277.  100% of the readings were above target with 0 within target and 0 below target.  It appears that his sugars are higher after dinner than the morning fasting sugars, although they are all elevated.  With regards to his associated vascular disease, he states the claudication is stable and at baseline.  Plan  We decided to increase the Lantus to 20 units every morning and the NovoLog insulin to 20 units prior to dinner.  He will continue to monitor his blood sugars twice daily and will follow up on the morning of Monday, November 18 for further adjustments in his insulin regimen.  We are continuing to aggressively address his cardiovascular risk factors given the associated vascular disease.

## 2017-11-28 NOTE — Assessment & Plan Note (Signed)
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  He continues to smoke and remains in the pre-contemplative stage of tobacco cessation.  He is on a high intensity statin and tolerating this well.  His blood pressure is also well within target on lisinopril.  Plan  We will continue to aggressively address his cardiovascular risk factors and reassess his readiness to quit smoking at the follow-up visit.

## 2017-11-30 ENCOUNTER — Other Ambulatory Visit: Payer: Self-pay | Admitting: Oncology

## 2017-11-30 DIAGNOSIS — Z5112 Encounter for antineoplastic immunotherapy: Secondary | ICD-10-CM

## 2017-11-30 DIAGNOSIS — C3491 Malignant neoplasm of unspecified part of right bronchus or lung: Secondary | ICD-10-CM

## 2017-11-30 DIAGNOSIS — C171 Malignant neoplasm of jejunum: Secondary | ICD-10-CM

## 2017-11-30 DIAGNOSIS — E1159 Type 2 diabetes mellitus with other circulatory complications: Secondary | ICD-10-CM

## 2017-12-02 ENCOUNTER — Ambulatory Visit (HOSPITAL_COMMUNITY)
Admission: RE | Admit: 2017-12-02 | Discharge: 2017-12-02 | Disposition: A | Discharge status: Home / Self Care | Payer: PPO | Source: Ambulatory Visit | Attending: Internal Medicine | Admitting: Internal Medicine

## 2017-12-02 DIAGNOSIS — I2510 Atherosclerotic heart disease of native coronary artery without angina pectoris: Secondary | ICD-10-CM | POA: Diagnosis not present

## 2017-12-02 DIAGNOSIS — C3490 Malignant neoplasm of unspecified part of unspecified bronchus or lung: Secondary | ICD-10-CM

## 2017-12-02 DIAGNOSIS — R911 Solitary pulmonary nodule: Secondary | ICD-10-CM | POA: Diagnosis not present

## 2017-12-02 DIAGNOSIS — R918 Other nonspecific abnormal finding of lung field: Secondary | ICD-10-CM | POA: Insufficient documentation

## 2017-12-02 DIAGNOSIS — I700 Atherosclerosis of aorta: Secondary | ICD-10-CM | POA: Diagnosis not present

## 2017-12-02 MED ORDER — SODIUM CHLORIDE (PF) 0.9 % IJ SOLN
INTRAMUSCULAR | Status: AC
  Filled 2017-12-02: qty 50

## 2017-12-02 MED ORDER — IOHEXOL 300 MG/ML  SOLN
100.0000 mL | Freq: Once | INTRAMUSCULAR | Status: AC | PRN
  Administered 2017-12-02: 100 mL via INTRAVENOUS

## 2017-12-03 ENCOUNTER — Inpatient Hospital Stay: Payer: PPO

## 2017-12-03 ENCOUNTER — Encounter: Payer: Self-pay | Admitting: Oncology

## 2017-12-03 ENCOUNTER — Inpatient Hospital Stay: Payer: PPO | Attending: Internal Medicine | Admitting: Oncology

## 2017-12-03 VITALS — BP 116/71 | HR 69 | Temp 97.7°F | Resp 18 | Ht 67.0 in | Wt 114.5 lb

## 2017-12-03 DIAGNOSIS — C3411 Malignant neoplasm of upper lobe, right bronchus or lung: Secondary | ICD-10-CM | POA: Diagnosis not present

## 2017-12-03 DIAGNOSIS — Z79899 Other long term (current) drug therapy: Secondary | ICD-10-CM | POA: Insufficient documentation

## 2017-12-03 DIAGNOSIS — C3491 Malignant neoplasm of unspecified part of right bronchus or lung: Secondary | ICD-10-CM

## 2017-12-03 DIAGNOSIS — Z5112 Encounter for antineoplastic immunotherapy: Secondary | ICD-10-CM | POA: Diagnosis not present

## 2017-12-03 DIAGNOSIS — Z794 Long term (current) use of insulin: Secondary | ICD-10-CM | POA: Diagnosis not present

## 2017-12-03 DIAGNOSIS — C784 Secondary malignant neoplasm of small intestine: Secondary | ICD-10-CM | POA: Insufficient documentation

## 2017-12-03 DIAGNOSIS — E1165 Type 2 diabetes mellitus with hyperglycemia: Secondary | ICD-10-CM | POA: Diagnosis not present

## 2017-12-03 DIAGNOSIS — E1159 Type 2 diabetes mellitus with other circulatory complications: Secondary | ICD-10-CM

## 2017-12-03 LAB — CBC WITH DIFFERENTIAL (CANCER CENTER ONLY)
Abs Immature Granulocytes: 0.05 10*3/uL (ref 0.00–0.07)
Basophils Absolute: 0 10*3/uL (ref 0.0–0.1)
Basophils Relative: 0 %
Eosinophils Absolute: 0.1 10*3/uL (ref 0.0–0.5)
Eosinophils Relative: 1 %
HCT: 42 % (ref 39.0–52.0)
Hemoglobin: 13.7 g/dL (ref 13.0–17.0)
Immature Granulocytes: 1 %
Lymphocytes Relative: 24 %
Lymphs Abs: 2 10*3/uL (ref 0.7–4.0)
MCH: 30 pg (ref 26.0–34.0)
MCHC: 32.6 g/dL (ref 30.0–36.0)
MCV: 91.9 fL (ref 80.0–100.0)
Monocytes Absolute: 0.6 10*3/uL (ref 0.1–1.0)
Monocytes Relative: 7 %
Neutro Abs: 5.7 10*3/uL (ref 1.7–7.7)
Neutrophils Relative %: 67 %
Platelet Count: 224 10*3/uL (ref 150–400)
RBC: 4.57 MIL/uL (ref 4.22–5.81)
RDW: 13.3 % (ref 11.5–15.5)
WBC Count: 8.4 10*3/uL (ref 4.0–10.5)
nRBC: 0 % (ref 0.0–0.2)

## 2017-12-03 LAB — CMP (CANCER CENTER ONLY)
ALT: 10 U/L (ref 0–44)
AST: 10 U/L — ABNORMAL LOW (ref 15–41)
Albumin: 3.7 g/dL (ref 3.5–5.0)
Alkaline Phosphatase: 91 U/L (ref 38–126)
Anion gap: 11 (ref 5–15)
BUN: 10 mg/dL (ref 6–20)
CO2: 26 mmol/L (ref 22–32)
Calcium: 9.7 mg/dL (ref 8.9–10.3)
Chloride: 101 mmol/L (ref 98–111)
Creatinine: 0.97 mg/dL (ref 0.61–1.24)
GFR, Est AFR Am: >60 mL/min (ref >60)
GFR, Estimated: >60 mL/min (ref >60)
Glucose, Bld: 468 mg/dL — ABNORMAL HIGH (ref 70–99)
Potassium: 4.3 mmol/L (ref 3.5–5.1)
Sodium: 138 mmol/L (ref 135–145)
Total Bilirubin: 0.3 mg/dL (ref 0.3–1.2)
Total Protein: 7 g/dL (ref 6.5–8.1)

## 2017-12-03 LAB — TSH: TSH: 3.776 u[IU]/mL (ref 0.320–4.118)

## 2017-12-03 LAB — GLUCOSE, CAPILLARY: Glucose-Capillary: 404 mg/dL — ABNORMAL HIGH (ref 70–99)

## 2017-12-03 MED ORDER — SODIUM CHLORIDE 0.9% FLUSH
10.0000 mL | INTRAVENOUS | Status: DC | PRN
  Administered 2017-12-03: 10 mL
  Filled 2017-12-03: qty 10

## 2017-12-03 MED ORDER — INSULIN REGULAR HUMAN 100 UNIT/ML IJ SOLN
15.0000 [IU] | Freq: Once | INTRAMUSCULAR | Status: AC
  Administered 2017-12-03: 15 [IU] via SUBCUTANEOUS
  Filled 2017-12-03: qty 10

## 2017-12-03 MED ORDER — SODIUM CHLORIDE 0.9 % IV SOLN
Freq: Once | INTRAVENOUS | Status: AC
  Administered 2017-12-03: 14:00:00 via INTRAVENOUS
  Filled 2017-12-03: qty 250

## 2017-12-03 MED ORDER — SODIUM CHLORIDE 0.9 % IV SOLN
200.0000 mg | Freq: Once | INTRAVENOUS | Status: AC
  Administered 2017-12-03: 200 mg via INTRAVENOUS
  Filled 2017-12-03: qty 8

## 2017-12-03 MED ORDER — HEPARIN SOD (PORK) LOCK FLUSH 100 UNIT/ML IV SOLN
500.0000 [IU] | Freq: Once | INTRAVENOUS | Status: AC | PRN
  Administered 2017-12-03: 500 [IU]
  Filled 2017-12-03: qty 5

## 2017-12-03 NOTE — Progress Notes (Signed)
Pt FSBS at 1422 was 404.  Per Erasmo Downer NP, administer 15 units of insulin and check 30 minutes post.

## 2017-12-03 NOTE — Patient Instructions (Signed)
[facility] Discharge Instructions for Patients Receiving Chemotherapy  Today you received the following chemotherapy agents:  Keytruda.  To help prevent nausea and vomiting after your treatment, we encourage you to take your nausea medication as directed.   If you develop nausea and vomiting that is not controlled by your nausea medication, call the clinic.   BELOW ARE SYMPTOMS THAT SHOULD BE REPORTED IMMEDIATELY:  *FEVER GREATER THAN 100.5 F  *CHILLS WITH OR WITHOUT FEVER  NAUSEA AND VOMITING THAT IS NOT CONTROLLED WITH YOUR NAUSEA MEDICATION  *UNUSUAL SHORTNESS OF BREATH  *UNUSUAL BRUISING OR BLEEDING  TENDERNESS IN MOUTH AND THROAT WITH OR WITHOUT PRESENCE OF ULCERS  *URINARY PROBLEMS  *BOWEL PROBLEMS  UNUSUAL RASH Items with * indicate a potential emergency and should be followed up as soon as possible.  Feel free to call the clinic should you have any questions or concerns. The clinic phone number is [phone].  Please show the CHEMO ALERT CARD at check-in to the Emergency Department and triage nurse.    

## 2017-12-03 NOTE — Progress Notes (Signed)
[place] OFFICE PROGRESS NOTE  [name], MD [place] [address]  DIAGNOSIS: Stage IV (T1b, N0, M1b) non-small cell lung cancer, adenocarcinoma presented with right upper lobe lung nodule and recent metastasis to the small intestine. This was initially diagnosed in September 2017.  Genomic Alterations Identified? ERBB2 amplification - equivocal? CDKN2A p16INK4a E88* and p14ARF P536R SMARCA4 splice site [id number]>YP SPTA1 E2022* TOP2A amplification TP53 A159P Additional Findings? Microsatellite status MS-Stable Tumor Mutation Burden TMB-Intermediate; 18 Muts/Mb Additional Disease-relevant Genes with No Reportable Alterations Identified? EGFR KRAS ALK BRAF MET RET ROS1   PRIOR THERAPY:  1) Status post right VATS with right upper lobectomy and mediastinal lymph node dissection under the care of Dr. Roxan Hockey on 10/18/2015 and the final pathology was consistent with stage IA (T1b, N0, MX). 2) upper endoscopy on 01/05/2016 showed normal esophagus, normal stomach but there was occasional mass around 3.0 CM in length circumferential nonobstructing in the jejunum. The final pathology was consistent with metastatic adenocarcinoma. 3) status post laparoscopic laparotomy and resection of proximal lesion and and distal jejunum/proximal ileum under the care of Dr. Hassell Done 1 02/27/2016. 3)  Systemic chemotherapy with carboplatin for AUC of 5 and Alimta 500 MG/M2 every 3 weeks. First dose 04/04/2016. Status post 2 cycles. Last dose was given 04/21/2016 discontinued secondary to disease progression.  CURRENT THERAPY: Second line immunotherapy with Ketruda 200 mg IV every 2 weeks, first dose 05/30/2016. Status post 26 cycles.  INTERVAL HISTORY: Brett [name] 57 y.o. male returns for routine follow-up visit accompanied by his wife.  The patient is feeling fine today and has no specific complaints.  He reports his blood sugars have been up and down.   They have been as low as the 80s and he was symptomatic.  He held his insulin during this time.  He reports that after holding insulin and taking prednisone for premed for his CT scan his blood sugar was as high as 600.  He is being followed closely by his primary care provider with adjustments in his insulin.  Today he denies fevers and chills.  Denies chest pain, shortness of breath, cough, hemoptysis.  Denies nausea, vomiting, constipation, diarrhea.  Denies recent weight loss or night sweats.  The patient had a restaging CT scan and is here to discuss the results.  MEDICAL HISTORY: Past Medical History:  Diagnosis Date  . Barrett's esophagus 07/01/2013   Without dysplasia on biopsy 09/03/2012. Repeat EGD recommended 08/2015  . Bilateral cataracts 02/13/2017  . Carotid artery stenosis 07/01/2013   Requiring right sided stent   . Chronic pain syndrome 07/01/2013  . Closed head injury with brief loss of consciousness ([place]) 07/22/2010   Head trapped in a hydraulic device at work.  Fracture of orbital bones on right and brief loss of consciousness per report.  . 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 There was likely a strong psycho-social overlay in regard to the cognitive deficits in the form of mood disorder with psychotic features and mixed anxiety symptomatology. His primary tested cognitive deficits are in the areas of attention, executi  . Daily headache "since 07/2010"   constantly  . Degenerative joint disease of cervical spine 07/01/2013  . Dupuytren's contracture of both hands 04/08/2014  . Encounter for antineoplastic chemotherapy 10/05/2015  . Encounter for antineoplastic immunotherapy 05/24/2016  . Erectile dysfunction associated with type 2 diabetes mellitus (  [place]) 07/01/2013  . Fibromyalgia 07/01/2013  . Goals of care, counseling/discussion 03/28/2016   . History of blood transfusion 11/28/2015   "suppose to get his first today" (11/28/2015)  . Hyperlipidemia LDL goal < 100 07/01/2013  . Intractable hiccups 04/11/2016  . 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 Memory changes    "memory issues" from head injury  . Moderate protein-calorie malnutrition ([place]) 11/29/2015  . Osteoarthritis of right thumb 10/21/2014  . Peripheral vascular occlusive disease ([place]) 07/01/2013   Requiring 2 arterial stents above the left knee per report  . Pneumonia ~ 2006/2007  . Post traumatic stress disorder 07/01/2013  . Primary lung adenocarcinoma ([place]) dx'd 08/2015   "right lung"  . Severe major depression with psychotic features ([place]) 04/15/2011  . Tobacco abuse 07/01/2013  . Type 2 diabetes mellitus with vascular disease ([place]) 07/01/2013   Left lower extremity and right carotid stenting    ALLERGIES:  is allergic to gabapentin; lyrica [pregabalin]; jardiance [empagliflozin]; celebrex [celecoxib]; and contrast media [iodinated diagnostic agents].  MEDICATIONS:  Current Outpatient Medications  Medication Sig Dispense Refill  . ACCU-CHEK SOFTCLIX LANCETS lancets Use to test blood glucose 1-2 times daily. Dx Code E11.59 100 each 12  . aspirin EC 81 MG tablet Take 81 mg by mouth daily.     Marland Kitchen atorvastatin (LIPITOR) 40 MG tablet Take 1 tablet (40 mg total) by mouth daily. 90 tablet 3  . Blood Glucose Monitoring Suppl (ACCU-CHEK AVIVA PLUS) w/Device KIT Use to test blood glucose 1-2 times daily. Dx Code E11.59 1 kit 0  . cyclobenzaprine (FLEXERIL) 10 MG tablet Take 1 tablet (10 mg total) by mouth 3 (three) times daily as needed for muscle spasms. 90 tablet 11  . diphenhydrAMINE (BENADRYL) 25 mg capsule Take 2 capsules (50 mg total) by mouth as directed. Take prior to CT scan as directed 30 capsule 0  . dronabinol (MARINOL) 2.5 MG capsule Take 1 capsule (2.5 mg total) by mouth 2 (two) times daily before a meal. 60 capsule 0  . glipiZIDE (GLUCOTROL) 10 MG tablet Take  2 tablets (20 mg total) by mouth 2 (two) times daily before a meal. 360 tablet 3  . glucose blood (ACCU-CHEK AVIVA) test strip Use to test blood glucose 1-2 times daily. Dx Code E11.59 100 each 12  . hydrOXYzine (ATARAX/VISTARIL) 10 MG tablet Take 1 tablet (10 mg total) by mouth 3 (three) times daily as needed for itching. 30 tablet 0  . insulin aspart (NOVOLOG) 100 UNIT/ML FlexPen Inject 20 Units into the skin daily. Just before dinner 5 pen 1  . Insulin Glargine (LANTUS) 100 UNIT/ML Solostar Pen Inject 20 Units into the skin daily. 15 mL 3  . Insulin Pen Needle (PEN NEEDLES) 32G X 4 MM MISC 1 pen by Does not apply route at bedtime. Use to inject lantus once daily at bedtime Dx Code E11.59 100 each 6  . lidocaine-prilocaine (EMLA) cream Apply 1 application topically as needed. 30 g 0  . lisinopril (PRINIVIL,ZESTRIL) 5 MG tablet Take 0.5 tablets (2.5 mg total) by mouth daily. 90 tablet 3  . metFORMIN (GLUCOPHAGE-XR) 500 MG 24 hr tablet Take 1 tablet (500 mg total) by mouth 2 (two) times daily. 180 tablet 3  . metoCLOPramide (REGLAN) 10 MG tablet TAKE 1 TABLET BY MOUTH FOUR TIMES DAILY AS NEEDED AS DIRECTED (Patient not taking: Reported on 10/02/2017) 30 tablet 0  . ondansetron (ZOFRAN) 4 MG tablet Take 1 tablet (4 mg total) by mouth 3 (three) times daily. 20 tablet  0  . [START ON 12/11/2017] oxyCODONE-acetaminophen (PERCOCET) 10-325 MG tablet Take 1-2 tablets by mouth every 6 (six) hours as needed for pain. 240 tablet 0  . pantoprazole (PROTONIX) 40 MG tablet TAKE 1 TABLET(40 MG) BY MOUTH DAILY 90 tablet 1  . predniSONE (DELTASONE) 50 MG tablet TAKE 1 TABLET BY MOUTH AS DIRECTED. 1 TABLET 13 HOURS, 7 HOURS, AND 1 HOUR PRIOR TO SCAN 3 tablet 0  . prochlorperazine (COMPAZINE) 10 MG tablet Take 10 mg by mouth every 6 (six) hours as needed for nausea or vomiting.    . prochlorperazine (COMPAZINE) 25 MG suppository Place 1 suppository (25 mg total) rectally every 12 (twelve) hours as needed for refractory  nausea / vomiting. 12 suppository 11  . PROLENSA 0.07 % SOLN Place 1 drop into both eyes 2 (two) times daily.  0  . senna-docusate (SENOKOT-S) 8.6-50 MG tablet Take 1-2 tablets by mouth 2 (two) times daily as needed for mild constipation or moderate constipation. 30 tablet 1  . sitaGLIPtin (JANUVIA) 100 MG tablet Take 1 tablet (100 mg total) by mouth daily. 90 tablet 3  . sorbitol 70 % solution Take 15 cc every 4 hours until bowel movement (Patient not taking: Reported on 10/02/2017) 473 mL 0   No current facility-administered medications for this visit.     SURGICAL HISTORY:  Past Surgical History:  Procedure Laterality Date  . CAROTID STENT Right ?2014  . COLONOSCOPY N/A 11/30/2015   Procedure: COLONOSCOPY;  Surgeon: Teena Irani, MD;  Location: [facility] ENDOSCOPY;  Service: Endoscopy;  Laterality: N/A;  . ESOPHAGOGASTRODUODENOSCOPY N/A 11/30/2015   Procedure: ESOPHAGOGASTRODUODENOSCOPY (EGD);  Surgeon: Teena Irani, MD;  Location: [facility] ENDOSCOPY;  Service: Endoscopy;  Laterality: N/A;  . ESOPHAGOGASTRODUODENOSCOPY (EGD) WITH PROPOFOL N/A 01/05/2016   Procedure: ESOPHAGOGASTRODUODENOSCOPY (EGD) WITH PROPOFOL;  Surgeon: Teena Irani, MD;  Location: [facility] ENDOSCOPY;  Service: Endoscopy;  Laterality: N/A;  . FEMORAL ARTERY STENT Left 05/2012; ~ 2015   Archie Endo 06/04/2012; Raechel Chute report  . FRACTURE SURGERY    . GIVENS CAPSULE STUDY N/A 12/22/2015   Procedure: GIVENS CAPSULE STUDY;  Surgeon: Wonda Horner, MD;  Location: [facility] ENDOSCOPY;  Service: Endoscopy;  Laterality: N/A;  . HARDWARE REMOVAL Right 11/15/2011   Removal of deep frontozygomatic orbital hardware/notes 11/15/2011  . HERNIA REPAIR  [id number]   Umbilical  . LAPAROSCOPY N/A 02/27/2016   Procedure: LAPAROSCOPY, LAPAROTOMY  WITH TWO SMALL BOWEL RESECTION;  Surgeon: Johnathan Hausen, MD;  Location: [facility] ORS;  Service: General;  Laterality: N/A;  . ORIF ORBITAL FRACTURE Right 08/15/2010    caught in a hydraulic machine; open reduction internal fixation of orbital  rim fracture and open reduction of zygomatic arch fracture  Archie Endo 10/13/2009  . PORTACATH PLACEMENT Left 04/04/2016   Procedure: INSERTION PORT-A-CATH LEFT CHEST;  Surgeon: Melrose Nakayama, MD;  Location: [facility];  Service: Thoracic;  Laterality: Left;  Marland Kitchen VIDEO ASSISTED THORACOSCOPY (VATS)/ LOBECTOMY Right 10/18/2015   Procedure: VIDEO ASSISTED THORACOSCOPY (VATS)/ LOBECTOMY;  Surgeon: Melrose Nakayama, MD;  Location: [facility];  Service: Thoracic;  Laterality: Right;  Marland Kitchen VIDEO BRONCHOSCOPY Bilateral 09/21/2015   Procedure: VIDEO BRONCHOSCOPY WITH FLUORO;  Surgeon: Juanito Doom, MD;  Location: [facility] ENDOSCOPY;  Service: Cardiopulmonary;  Laterality: Bilateral;    REVIEW OF SYSTEMS:   Review of Systems  Constitutional: Negative for appetite change, chills, fatigue, fever and unexpected weight change.  HENT:   Negative for mouth sores, nosebleeds, sore throat and trouble swallowing.   Eyes: Negative for eye problems and icterus.  Respiratory: Negative for cough, hemoptysis, shortness of breath and wheezing.   Cardiovascular: Negative for chest pain and leg swelling.  Gastrointestinal: Negative for abdominal pain, constipation, diarrhea, nausea and vomiting.  Genitourinary: Negative for bladder incontinence, difficulty urinating, dysuria, frequency and hematuria.   Musculoskeletal: Negative for back pain, gait problem, neck pain and neck stiffness.  Skin: Negative for itching and rash.  Neurological: Negative for dizziness, extremity weakness, gait problem, headaches, light-headedness and seizures.  Hematological: Negative for adenopathy. Does not bruise/bleed easily.  Psychiatric/Behavioral: Negative for confusion, depression and sleep disturbance. The patient is not nervous/anxious.     PHYSICAL EXAMINATION:  Blood pressure 116/71, pulse 69, temperature 97.7 F (36.5 C), temperature source Oral, resp. rate 18, height '5\' 7"'  (1.702 m), weight 114 lb 8 oz (51.9 kg), SpO2 100 %.  ECOG  PERFORMANCE STATUS: 1 - Symptomatic but completely ambulatory  Physical Exam  Constitutional: Oriented to person, place, and time. No distress.  HENT:  Head: Normocephalic and atraumatic.  Mouth/Throat: Oropharynx is clear and moist. No oropharyngeal exudate.  Eyes: Conjunctivae are normal. Right eye exhibits no discharge. Left eye exhibits no discharge. No scleral icterus.  Neck: Normal range of motion. Neck supple.  Cardiovascular: Normal rate, regular rhythm, normal heart sounds and intact distal pulses.   Pulmonary/Chest: Effort normal and breath sounds normal. No respiratory distress. No wheezes. No rales.  Abdominal: Soft. Bowel sounds are normal. Exhibits no distension and no mass. There is no tenderness.  Musculoskeletal: Normal range of motion. Exhibits no edema.  Lymphadenopathy:    No cervical adenopathy.  Neurological: Alert and oriented to person, place, and time. Exhibits normal muscle tone. Gait normal. Coordination normal.  Skin: Skin is warm and dry. No rash noted. Not diaphoretic. No erythema. No pallor.  Psychiatric: Mood, memory and judgment normal.  Vitals reviewed.  LABORATORY DATA: Lab Results  Component Value Date   WBC 8.4 12/03/2017   HGB 13.7 12/03/2017   HCT 42.0 12/03/2017   MCV 91.9 12/03/2017   PLT 224 12/03/2017      Chemistry      Component Value Date/Time   NA 138 12/03/2017 1250   NA 140 01/23/2017 0910   K 4.3 12/03/2017 1250   K 4.5 01/23/2017 0910   CL 101 12/03/2017 1250   CO2 26 12/03/2017 1250   CO2 28 01/23/2017 0910   BUN 10 12/03/2017 1250   BUN 8.1 01/23/2017 0910   CREATININE 0.97 12/03/2017 1250   CREATININE 0.8 01/23/2017 0910   GLU 398 (H) 08/08/2016 1257      Component Value Date/Time   CALCIUM 9.7 12/03/2017 1250   CALCIUM 9.6 01/23/2017 0910   ALKPHOS 91 12/03/2017 1250   ALKPHOS 113 01/23/2017 0910   AST 10 (L) 12/03/2017 1250   AST 11 01/23/2017 0910   ALT 10 12/03/2017 1250   ALT 12 01/23/2017 0910   BILITOT  0.3 12/03/2017 1250   BILITOT 0.40 01/23/2017 0910       RADIOGRAPHIC STUDIES:  Ct Chest W Contrast  Result Date: 12/02/2017 CLINICAL DATA:  History of metastatic non-small cell right lung cancer metastatic to the small bowel. Ongoing Keytruda therapy. EXAM: CT CHEST, ABDOMEN, AND PELVIS WITH CONTRAST TECHNIQUE: Multidetector CT imaging of the chest, abdomen and pelvis was performed following the standard protocol during bolus administration of intravenous contrast. CONTRAST:  147m OMNIPAQUE IOHEXOL 300 MG/ML SOLN. The technologist reports minimal contrast infiltration (approximately 5 cc) at the patient's IV. The patient was premedicated according to the 13hour steroid and  Benadryl prep due to a history of iodinated contrast allergy. There were no immediate complications. COMPARISON:  CT 09/09/2017 and 06/18/2017 FINDINGS: CT CHEST FINDINGS Cardiovascular: There is extensive coronary artery atherosclerosis with lesser involvement of the aorta and great vessels. Left subclavian Port-A-Cath terminates at the mid SVC. This catheter is directed posteriorly towards the azygos orifice on the sagittal images, although does not appear to enter the azygos vein and is unchanged from the previous studies. The heart size is normal. There is no pericardial effusion. Mediastinum/Nodes: There are no enlarged mediastinal, hilar or axillary lymph nodes. The thyroid gland, trachea and esophagus demonstrate no significant findings. Lungs/Pleura: There is no pleural effusion or pneumothorax. Patient is status post right upper lobectomy. There are mild emphysematous changes. There is new clustered nodularity in the right lower lobe (images 47 through 53/4) and in the right middle lobe (images 79 through 85/4), likely inflammatory. Tiny left upper lobe nodule (image 33/4) is stable. Small nodular density laterally at the left costophrenic angle (image 123/4) is likely atelectasis based on the appearance on the reformatted  images. No suspicious pulmonary nodules. Mild underlying centrilobular emphysema. Musculoskeletal/Chest wall: No chest wall mass or suspicious osseous findings. CT ABDOMEN AND PELVIS FINDINGS Hepatobiliary: The liver is normal in density without focal abnormality. There are small calcified gallstones. No gallbladder wall thickening, surrounding inflammation or biliary dilatation. Pancreas: Unremarkable. No pancreatic ductal dilatation or surrounding inflammatory changes. Spleen: Normal in size without focal abnormality. Adrenals/Urinary Tract: Both adrenal glands appear normal. Probable tiny nonobstructing calculus in the upper pole of the right kidney, best seen on coronal image 95/5. The kidneys otherwise appear normal without additional urinary tract calculi, suspicious lesion or hydronephrosis. No bladder abnormalities are seen. Stomach/Bowel: No evidence of bowel wall thickening, distention or surrounding inflammatory change. No recurrent small bowel masses are identified status post apparent partial small bowel resection and anastomosis in the right mid abdomen. The appendix appears normal. There is prominent stool throughout the colon. Vascular/Lymphatic: There are no enlarged abdominal or pelvic lymph nodes. 9 mm central mesenteric node on image 73/2 is stable there is moderate aortic and branch vessel atherosclerosis. No acute vascular findings. Reproductive: Stable appearance of the prostate gland and seminal vesicles. Other: No evidence of abdominal wall mass or hernia. There is no ascites or peritoneal nodularity. Musculoskeletal: No acute or significant osseous findings. IMPRESSION: 1. No evidence of local thoracic recurrence or metastatic disease in the chest, abdomen or pelvis. 2. New clustered pulmonary nodularity in the right middle and lower lobes, likely inflammatory. Given the patient's history, attention on follow-up necessary. 3. Port-A-Cath tip directed posteriorly towards the orifice of the  azygos vein, although appears unchanged. 4. Coronary and Aortic Atherosclerosis (ICD10-I70.0). Electronically Signed   By: Richardean Sale M.D.   On: 12/02/2017 15:25   Ct Abdomen Pelvis W Contrast  Result Date: 12/02/2017 CLINICAL DATA:  History of metastatic non-small cell right lung cancer metastatic to the small bowel. Ongoing Keytruda therapy. EXAM: CT CHEST, ABDOMEN, AND PELVIS WITH CONTRAST TECHNIQUE: Multidetector CT imaging of the chest, abdomen and pelvis was performed following the standard protocol during bolus administration of intravenous contrast. CONTRAST:  163m OMNIPAQUE IOHEXOL 300 MG/ML SOLN. The technologist reports minimal contrast infiltration (approximately 5 cc) at the patient's IV. The patient was premedicated according to the 13hour steroid and Benadryl prep due to a history of iodinated contrast allergy. There were no immediate complications. COMPARISON:  CT 09/09/2017 and 06/18/2017 FINDINGS: CT CHEST FINDINGS Cardiovascular: There is extensive coronary  artery atherosclerosis with lesser involvement of the aorta and great vessels. Left subclavian Port-A-Cath terminates at the mid SVC. This catheter is directed posteriorly towards the azygos orifice on the sagittal images, although does not appear to enter the azygos vein and is unchanged from the previous studies. The heart size is normal. There is no pericardial effusion. Mediastinum/Nodes: There are no enlarged mediastinal, hilar or axillary lymph nodes. The thyroid gland, trachea and esophagus demonstrate no significant findings. Lungs/Pleura: There is no pleural effusion or pneumothorax. Patient is status post right upper lobectomy. There are mild emphysematous changes. There is new clustered nodularity in the right lower lobe (images 47 through 53/4) and in the right middle lobe (images 79 through 85/4), likely inflammatory. Tiny left upper lobe nodule (image 33/4) is stable. Small nodular density laterally at the left  costophrenic angle (image 123/4) is likely atelectasis based on the appearance on the reformatted images. No suspicious pulmonary nodules. Mild underlying centrilobular emphysema. Musculoskeletal/Chest wall: No chest wall mass or suspicious osseous findings. CT ABDOMEN AND PELVIS FINDINGS Hepatobiliary: The liver is normal in density without focal abnormality. There are small calcified gallstones. No gallbladder wall thickening, surrounding inflammation or biliary dilatation. Pancreas: Unremarkable. No pancreatic ductal dilatation or surrounding inflammatory changes. Spleen: Normal in size without focal abnormality. Adrenals/Urinary Tract: Both adrenal glands appear normal. Probable tiny nonobstructing calculus in the upper pole of the right kidney, best seen on coronal image 95/5. The kidneys otherwise appear normal without additional urinary tract calculi, suspicious lesion or hydronephrosis. No bladder abnormalities are seen. Stomach/Bowel: No evidence of bowel wall thickening, distention or surrounding inflammatory change. No recurrent small bowel masses are identified status post apparent partial small bowel resection and anastomosis in the right mid abdomen. The appendix appears normal. There is prominent stool throughout the colon. Vascular/Lymphatic: There are no enlarged abdominal or pelvic lymph nodes. 9 mm central mesenteric node on image 73/2 is stable there is moderate aortic and branch vessel atherosclerosis. No acute vascular findings. Reproductive: Stable appearance of the prostate gland and seminal vesicles. Other: No evidence of abdominal wall mass or hernia. There is no ascites or peritoneal nodularity. Musculoskeletal: No acute or significant osseous findings. IMPRESSION: 1. No evidence of local thoracic recurrence or metastatic disease in the chest, abdomen or pelvis. 2. New clustered pulmonary nodularity in the right middle and lower lobes, likely inflammatory. Given the patient's history,  attention on follow-up necessary. 3. Port-A-Cath tip directed posteriorly towards the orifice of the azygos vein, although appears unchanged. 4. Coronary and Aortic Atherosclerosis (ICD10-I70.0). Electronically Signed   By: Richardean Sale M.D.   On: 12/02/2017 15:25     ASSESSMENT/PLAN:  Primary adenocarcinoma of right lung [facility]) This is a very pleasant 57 year old white male with metastatic non-small cell lung cancer, adenocarcinoma status post right upper lobectomy with lymph node dissection. Unfortunately the patient was found to have metastatic disease in the jejunum and terminal ileum.  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 The patient was started on treatment with systemic chemotherapy with carboplatin and Alimta for 2 cycles discontinued secondary to intolerance and disease progression. The patient is currently undergoing treatment with second line immunotherapy with Ketruda (pembrolizumab) 200 mg IV every 3 weeks, status post 26 cycles. The patient continues to tolerate this treatment well with no concerning adverse effects. He had a restaging CT scan and is here to discuss the  results.  The patient was seen with Dr. Earlie Server.  CT scan results were discussed with the patient and his wife which showed a new area in the right middle and lower lobes which could be inflammatory but otherwise stable disease.  We will watch this new area on his upcoming scans.  Recommend for him to continue on Keytruda 200 mg IV every 3 weeks.  He will proceed with cycle #27 today as scheduled.  He will follow-up in 3 weeks for evaluation prior to cycle #28.  His glucose is elevated in our office today at 468.  He will receive 15 units of regular insulin in the infusion area today.  He will continue to follow-up with his primary care provider for adjustment of his diabetes  medications.  He was advised to call immediately if he has any concerning symptoms in the interval. The patient voices understanding of current disease status and treatment options and is in agreement with the current care plan. All questions were answered. The patient knows to call the clinic with any problems, questions or concerns. We can certainly see the patient much sooner if necessary.   No orders of the defined types were placed in this encounter.    Mikey Bussing, DNP, AGPCNP-BC, AOCNP 12/03/17   ADDENDUM: Hematology/Oncology Attending: I had a face-to-face encounter with the patient.  I recommended his care plan.  This is a very pleasant 57 years old white male with metastatic non-small cell lung cancer and currently undergoing treatment with single agent Ketruda (pembrolizumab) status post 26 cycles.  The patient has been tolerating this treatment well with no concerning adverse effects.  His blood sugar is not well controlled because of the prednisone premedication for his a scan.  The patient denied having any significant skin rash or diarrhea.  He denied having any chest pain, shortness breath, cough or hemoptysis.  He continues to tolerate his treatment with Keytruda with no significant adverse effects.  The patient had a repeat CT scan of the chest, abdomen and pelvis performed recently.  I personally and independently reviewed the scans and discussed the results with the patient and his wife today.  His a scan showed no concerning findings for disease progression. I recommended for the patient to continue his current treatment with single agent Ketruda (pembrolizumab) and he will proceed with cycle #27 today. I will see the patient back for follow-up visit in 3 weeks for evaluation before starting the next cycle of his treatment. The patient was advised to call immediately if he has any concerning symptoms in the interval.  Disclaimer: This note was dictated with voice  recognition software. Similar sounding words can inadvertently be transcribed and may be missed upon review. Eilleen Kempf, MD 12/07/17

## 2017-12-04 ENCOUNTER — Other Ambulatory Visit: Payer: Self-pay

## 2017-12-04 ENCOUNTER — Ambulatory Visit: Payer: Self-pay

## 2017-12-04 ENCOUNTER — Telehealth: Payer: Self-pay | Admitting: Oncology

## 2017-12-04 ENCOUNTER — Ambulatory Visit: Payer: Self-pay | Admitting: Internal Medicine

## 2017-12-04 NOTE — Telephone Encounter (Signed)
Scheduled appt per 11/13 los - pt to get updated schedule next visit .

## 2017-12-04 NOTE — Assessment & Plan Note (Signed)
This is a very pleasant 57 year old white male with metastatic non-small cell lung cancer, adenocarcinoma status post right upper lobectomy with lymph node dissection. Unfortunately the patient was found to have metastatic disease in the jejunum and terminal ileum.  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 The patient was started on treatment with systemic chemotherapy with carboplatin and Alimta for 2 cycles discontinued secondary to intolerance and disease progression. The patient is currently undergoing treatment with second line immunotherapy with Ketruda (pembrolizumab) 200 mg IV every 3 weeks, status post 26 cycles. The patient continues to tolerate this treatment well with no concerning adverse effects. He had a restaging CT scan and is here to discuss the results.  The patient was seen with Dr. Earlie Server.  CT scan results were discussed with the patient and his wife which showed a new area in the right middle and lower lobes which could be inflammatory but otherwise stable disease.  We will watch this new area on his upcoming scans.  Recommend for him to continue on Keytruda 200 mg IV every 3 weeks.  He will proceed with cycle #27 today as scheduled.  He will follow-up in 3 weeks for evaluation prior to cycle #28.  His glucose is elevated in our office today at 468.  He will receive 15 units of regular insulin in the infusion area today.  He will continue to follow-up with his primary care provider for adjustment of his diabetes medications.  He was advised to call immediately if he has any concerning symptoms in the interval. The patient voices understanding of current disease status and treatment options and is in agreement with the current care plan. All questions were answered. The patient knows to call the clinic with any problems, questions or concerns.  We can certainly see the patient much sooner if necessary.

## 2017-12-08 ENCOUNTER — Other Ambulatory Visit: Payer: Self-pay

## 2017-12-08 ENCOUNTER — Ambulatory Visit (INDEPENDENT_AMBULATORY_CARE_PROVIDER_SITE_OTHER): Payer: PPO | Admitting: Internal Medicine

## 2017-12-08 ENCOUNTER — Encounter: Payer: Self-pay | Admitting: Internal Medicine

## 2017-12-08 VITALS — BP 102/66 | HR 72 | Temp 97.4°F | Wt 115.1 lb

## 2017-12-08 DIAGNOSIS — F39 Unspecified mood [affective] disorder: Secondary | ICD-10-CM | POA: Diagnosis not present

## 2017-12-08 DIAGNOSIS — E1159 Type 2 diabetes mellitus with other circulatory complications: Secondary | ICD-10-CM | POA: Diagnosis not present

## 2017-12-08 DIAGNOSIS — Z681 Body mass index (BMI) 19 or less, adult: Secondary | ICD-10-CM | POA: Diagnosis not present

## 2017-12-08 DIAGNOSIS — E440 Moderate protein-calorie malnutrition: Secondary | ICD-10-CM

## 2017-12-08 DIAGNOSIS — R21 Rash and other nonspecific skin eruption: Secondary | ICD-10-CM

## 2017-12-08 DIAGNOSIS — Z794 Long term (current) use of insulin: Secondary | ICD-10-CM | POA: Diagnosis not present

## 2017-12-08 MED ORDER — GLIPIZIDE 10 MG PO TABS: 10.0000 mg | ORAL_TABLET | Freq: Two times a day (BID) | ORAL | 3 refills | Status: AC

## 2017-12-08 MED ORDER — PEN NEEDLES 32G X 4 MM MISC: 1.0000 "pen " | Freq: Two times a day (BID) | 6 refills | Status: DC

## 2017-12-08 NOTE — Patient Instructions (Signed)
It was great to see you.  Keep up the great work with the insulin.  We will get this figured out.  1) Decrease the glipizide to 10 mg (1 tablet) twice daily.  2) Keep taking the Januvia and metformin at the doses you are taking.  3) Keep taking the lantus at 20 units once daily in the morning.  4) Keep taking your novolog at 20 units before dinner.  5) I will see you on December 6 at 10:15 AM.  I will see you also on December 20 at 10:15 AM  Call if you have sugars below 80 so we can make further adjustments over the phone.

## 2017-12-08 NOTE — Progress Notes (Signed)
   Subjective:    Patient ID: JOSEARMANDO KUHNERT, male    DOB: May 15, 1960, 57 y.o.   MRN: [id number]  HPI  OSIEL STICK is here for follow-up of his diabetes after a recent escalation in his insulin regimen and his moderate protein-calorie malnutrition. Please see the A&P for the status of the pt's chronic medical problems.  He has no acute medical complaints but is somewhat down with the passing of his older brother last week.  Today happens to be the viewing with the funeral tomorrow.  Review of Systems  Constitutional: Negative for activity change and unexpected weight change.  Psychiatric/Behavioral: Positive for dysphoric mood.      Objective:   Physical Exam  Constitutional: He is oriented to person, place, and time. He appears well-developed. No distress.  HENT:  Head: Normocephalic.  Eyes: Right eye exhibits no discharge. Left eye exhibits no discharge. No scleral icterus.  Abdominal: He exhibits no distension.  Musculoskeletal: Normal range of motion. He exhibits no edema.  Neurological: He is alert and oriented to person, place, and time.  Skin: Rash noted. He is not diaphoretic.  Patches of erythema on left arm are improved for last visit.  There is an abrasion secondary to a recent scratch from his dog.  It is not erythematous or warm.  Psychiatric: He has a normal mood and affect. His behavior is normal. Judgment and thought content normal.      Assessment & Plan:   Please see problem based charting.

## 2017-12-08 NOTE — Assessment & Plan Note (Signed)
Assessment  His glucometer was downloaded and we reviewed the results together.  There is a total of 55 readings dating back to mid October.  His average glucose is 270.  5% of the readings were within target and there was one hypoglycemic reading of 53 that was symptomatic.  He also had a symptomatic glucose of 82.  Complicating matters was the fact that he required a prednisone burst for his CT scan that is used for surveillance.  This happened last week and during the evenings his readings were above measurement on 3 successive nights.  The night that he did have the low sugar of 53 he had received an extra insulin dose at his chemotherapy session for a blood sugar over 400.  Again, this was in the setting of a prednisone burst for his contrast pretreatment.  Plan  We will continue the Lantus at 20 units in the morning and NovoLog at 20 units before dinner.  We will also continue the metformin at 500 mg by mouth twice daily and Januvia at 100 mg by mouth daily.  With the hypoglycemia we will decrease the glipizide to 10 mg by mouth twice daily.  With this change I anticipate he may have an increase in his sugars, but this should eliminate the low sugars that were a combination of the escalation in his insulin dosing and the extra dose of insulin he received prior to the symptomatic low.  The reason why I am backing off on the oral medication though is that he had these hypoglycemic episodes in the setting of prednisone.  Thus, once the prednisone is out of his system, which should be about now, I doubt we will have the same buffer of hyperglycemia that he had earlier.  He will return on December 6 at 10:15 AM for further adjustments in his insulin regimen based on the readings between now and then.  I have also already scheduled him for a follow-up 2 weeks after that to make further adjustments in his insulin regimen.  There were no changes in his peripheral vascular occlusive disease symptoms and thus no  changes in his regimen other than trying to get better glycemic control.

## 2017-12-08 NOTE — Assessment & Plan Note (Signed)
Assessment  With better glycemic control his weight has actually been going up.  He is up 3 pounds in the last 10 days and notes an improvement in his appetite.  His BMI is finally back over 18.  Plan  As we continue to get better control of his diabetes I anticipate his weight will continue to increase, especially with the use of insulin.  At this point, it is a balancing act between glycemic control and putting on weight to help him get through his treatment for metastatic lung cancer.  We have continued to encourage him to eat what ever he would like in order to improve his nutritional status.  We will reassess the progress he has made at the follow-up visit with a repeat weight.

## 2017-12-25 ENCOUNTER — Other Ambulatory Visit: Payer: Self-pay

## 2017-12-25 ENCOUNTER — Inpatient Hospital Stay: Payer: PPO | Attending: Internal Medicine | Admitting: Oncology

## 2017-12-25 ENCOUNTER — Encounter: Payer: Self-pay | Admitting: Oncology

## 2017-12-25 ENCOUNTER — Inpatient Hospital Stay: Payer: PPO

## 2017-12-25 VITALS — BP 94/62 | HR 89 | Temp 98.3°F | Resp 18 | Ht 67.0 in | Wt 113.0 lb

## 2017-12-25 DIAGNOSIS — E119 Type 2 diabetes mellitus without complications: Secondary | ICD-10-CM | POA: Insufficient documentation

## 2017-12-25 DIAGNOSIS — Z5112 Encounter for antineoplastic immunotherapy: Secondary | ICD-10-CM | POA: Diagnosis not present

## 2017-12-25 DIAGNOSIS — C3411 Malignant neoplasm of upper lobe, right bronchus or lung: Secondary | ICD-10-CM | POA: Diagnosis not present

## 2017-12-25 DIAGNOSIS — Z79899 Other long term (current) drug therapy: Secondary | ICD-10-CM | POA: Insufficient documentation

## 2017-12-25 DIAGNOSIS — C3491 Malignant neoplasm of unspecified part of right bronchus or lung: Secondary | ICD-10-CM

## 2017-12-25 DIAGNOSIS — C784 Secondary malignant neoplasm of small intestine: Secondary | ICD-10-CM | POA: Insufficient documentation

## 2017-12-25 LAB — CBC WITH DIFFERENTIAL (CANCER CENTER ONLY)
Abs Immature Granulocytes: 0.09 10*3/uL — ABNORMAL HIGH (ref 0.00–0.07)
Basophils Absolute: 0.1 10*3/uL (ref 0.0–0.1)
Basophils Relative: 1 %
Eosinophils Absolute: 0.3 10*3/uL (ref 0.0–0.5)
Eosinophils Relative: 4 %
HCT: 43.7 % (ref 39.0–52.0)
Hemoglobin: 14.1 g/dL (ref 13.0–17.0)
Immature Granulocytes: 1 %
Lymphocytes Relative: 23 %
Lymphs Abs: 1.7 10*3/uL (ref 0.7–4.0)
MCH: 29.8 pg (ref 26.0–34.0)
MCHC: 32.3 g/dL (ref 30.0–36.0)
MCV: 92.4 fL (ref 80.0–100.0)
Monocytes Absolute: 0.6 10*3/uL (ref 0.1–1.0)
Monocytes Relative: 8 %
Neutro Abs: 4.5 10*3/uL (ref 1.7–7.7)
Neutrophils Relative %: 63 %
Platelet Count: 232 10*3/uL (ref 150–400)
RBC: 4.73 MIL/uL (ref 4.22–5.81)
RDW: 13.1 % (ref 11.5–15.5)
WBC Count: 7.2 10*3/uL (ref 4.0–10.5)
nRBC: 0 % (ref 0.0–0.2)

## 2017-12-25 LAB — CMP (CANCER CENTER ONLY)
ALT: 8 U/L (ref 0–44)
AST: 11 U/L — ABNORMAL LOW (ref 15–41)
Albumin: 3.6 g/dL (ref 3.5–5.0)
Alkaline Phosphatase: 89 U/L (ref 38–126)
Anion gap: 10 (ref 5–15)
BUN: 8 mg/dL (ref 6–20)
CO2: 26 mmol/L (ref 22–32)
Calcium: 9.3 mg/dL (ref 8.9–10.3)
Chloride: 106 mmol/L (ref 98–111)
Creatinine: 0.76 mg/dL (ref 0.61–1.24)
GFR, Est AFR Am: >60 mL/min (ref >60)
GFR, Estimated: >60 mL/min (ref >60)
Glucose, Bld: 130 mg/dL — ABNORMAL HIGH (ref 70–99)
Potassium: 4 mmol/L (ref 3.5–5.1)
Sodium: 142 mmol/L (ref 135–145)
Total Bilirubin: 0.3 mg/dL (ref 0.3–1.2)
Total Protein: 6.9 g/dL (ref 6.5–8.1)

## 2017-12-25 LAB — TSH: TSH: 3.221 u[IU]/mL (ref 0.320–4.118)

## 2017-12-25 MED ORDER — SODIUM CHLORIDE 0.9 % IV SOLN
200.0000 mg | Freq: Once | INTRAVENOUS | Status: AC
  Administered 2017-12-25: 200 mg via INTRAVENOUS
  Filled 2017-12-25: qty 8

## 2017-12-25 MED ORDER — SODIUM CHLORIDE 0.9 % IV SOLN
Freq: Once | INTRAVENOUS | Status: AC
  Administered 2017-12-25: 11:00:00 via INTRAVENOUS
  Filled 2017-12-25: qty 250

## 2017-12-25 MED ORDER — HEPARIN SOD (PORK) LOCK FLUSH 100 UNIT/ML IV SOLN
500.0000 [IU] | Freq: Once | INTRAVENOUS | Status: AC | PRN
  Administered 2017-12-25: 500 [IU]
  Filled 2017-12-25: qty 5

## 2017-12-25 MED ORDER — SODIUM CHLORIDE 0.9% FLUSH
10.0000 mL | INTRAVENOUS | Status: DC | PRN
  Administered 2017-12-25: 10 mL
  Filled 2017-12-25: qty 10

## 2017-12-25 NOTE — Assessment & Plan Note (Addendum)
This is a very pleasant 57 year old white male with metastatic non-small cell lung cancer, adenocarcinoma status post right upper lobectomy with lymph node dissection. Unfortunately the patient was found to have metastatic disease in the jejunum and terminal ileum.  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 The patient was started on treatment with systemic chemotherapy with carboplatin and Alimta for 2 cycles discontinued secondary to intolerance and disease progression. The patient is currently undergoing treatment with second line immunotherapy with Ketruda (pembrolizumab) 200 mg IV every 3 weeks, status post 27cycles. The patient continues to tolerate this treatment well with no concerning adverse effects.  Recommend for him to proceed with cycle #28 today as scheduled.  He will follow-up in 3 weeks for evaluation prior to cycle #29.  For his diabetes, he will continue to follow-up with his primary care provider for ongoing management of his medications.  He was advised to call immediately if he has any concerning symptoms in the interval. The patient voices understanding of current disease status and treatment options and is in agreement with the current care plan. All questions were answered. The patient knows to call the clinic with any problems, questions or concerns. We can certainly see the patient much sooner if necessary.

## 2017-12-25 NOTE — Progress Notes (Signed)
[place] OFFICE PROGRESS NOTE  [name], MD [address] [place] [address]  DIAGNOSIS:Stage IV (T1b, N0, M1b) non-small cell lung cancer, adenocarcinoma presented with right upper lobe lung nodule and recent metastasis to the small intestine. This was initially diagnosed in September 2017.  Genomic Alterations Identified? ERBB2 amplification - equivocal? CDKN2A p16INK4a E88* and p14ARF W413K SMARCA4 splice site [id number] SPTA1 E2022* TOP2A amplification TP53 A159P Additional Findings? Microsatellite status MS-Stable Tumor Mutation Burden TMB-Intermediate; 18 Muts/Mb Additional Disease-relevant Genes with No Reportable Alterations Identified? EGFR KRAS ALK BRAF MET RET ROS1   PRIOR THERAPY:  1) Status post right VATS with right upper lobectomy and mediastinal lymph node dissection under the care of Dr. Roxan Hockey on 10/18/2015 and the final pathology was consistent with stage IA (T1b, N0, MX). 2) upper endoscopy on 01/05/2016 showed normal esophagus, normal stomach but there was occasional mass around 3.0 CM in length circumferential nonobstructing in the jejunum. The final pathology was consistent with metastatic adenocarcinoma. 3) status post laparoscopic laparotomy and resection of proximal lesion and and distal jejunum/proximal ileum under the care of Dr. Hassell Done 1 02/27/2016. 3) Systemic chemotherapy with carboplatin for AUC of 5 and Alimta 500 MG/M2 every 3 weeks. First dose 04/04/2016. Status post 2 cycles. Last dose was given 04/21/2016 discontinued secondary to disease progression.  CURRENT THERAPY: Second line immunotherapy with Keytruda 200 mg IV every 2 weeks, first dose 05/30/2016. Status post27cycles.  INTERVAL HISTORY: Brett [name] 57 y.o. male returns for routine follow-up visit accompanied by his wife.  The patient is feeling fine today and has no specific complaints.  The patient denies fevers and chills.  Denies chest  pain, shortness of breath, cough, hemoptysis.  Denies nausea, vomiting, constipation, diarrhea.  Denies recent weight loss or night sweats.  He is being followed closely by his primary care provider for ongoing management of his diabetes.  The patient is here for evaluation prior to cycle #28 of Keytruda.  MEDICAL HISTORY: Past Medical History:  Diagnosis Date  . Barrett's esophagus 07/01/2013   Without dysplasia on biopsy 09/03/2012. Repeat EGD recommended 08/2015  . Bilateral cataracts 02/13/2017  . Carotid artery stenosis 07/01/2013   Requiring right sided stent   . Chronic pain syndrome 07/01/2013  . Closed head injury with brief loss of consciousness ([place]) 07/22/2010   Head trapped in a hydraulic device at work.  Fracture of orbital bones on right and brief loss of consciousness per report.  . 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 There was likely a strong psycho-social overlay in regard to the cognitive deficits in the form of mood disorder with psychotic features and mixed anxiety symptomatology. His primary tested cognitive deficits are in the areas of attention, executi  . Daily headache "since 07/2010"   constantly  . Degenerative joint disease of cervical spine 07/01/2013  . Dupuytren's contracture of both hands 04/08/2014  . Encounter for antineoplastic chemotherapy 10/05/2015  . Encounter for antineoplastic immunotherapy 05/24/2016  . Erectile dysfunction associated with type 2 diabetes mellitus ([place]) 07/01/2013  . Fibromyalgia 07/01/2013  . Goals of care, counseling/discussion 03/28/2016  . History of blood transfusion 11/28/2015   "suppose to get his first today" (11/28/2015)  . Hyperlipidemia LDL goal < 100 07/01/2013  . Intractable hiccups 04/11/2016  . 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 Memory changes    "memory issues" from head injury   .  Moderate protein-calorie malnutrition ([place]) 11/29/2015  . Osteoarthritis of right thumb 10/21/2014  . Peripheral vascular occlusive disease ([place]) 07/01/2013   Requiring 2 arterial stents above the left knee per report  . Pneumonia ~ 2006/2007  . Post traumatic stress disorder 07/01/2013  . Primary lung adenocarcinoma ([place]) dx'd 08/2015   "right lung"  . Severe major depression with psychotic features ([place]) 04/15/2011  . Tobacco abuse 07/01/2013  . Type 2 diabetes mellitus with vascular disease ([place]) 07/01/2013   Left lower extremity and right carotid stenting    ALLERGIES:  is allergic to gabapentin; lyrica [pregabalin]; jardiance [empagliflozin]; celebrex [celecoxib]; and contrast media [iodinated diagnostic agents].  MEDICATIONS:  Current Outpatient Medications  Medication Sig Dispense Refill  . ACCU-CHEK SOFTCLIX LANCETS lancets Use to test blood glucose 1-2 times daily. Dx Code E11.59 100 each 12  . aspirin EC 81 MG tablet Take 81 mg by mouth daily.     Marland Kitchen atorvastatin (LIPITOR) 40 MG tablet Take 1 tablet (40 mg total) by mouth daily. 90 tablet 3  . Blood Glucose Monitoring Suppl (ACCU-CHEK AVIVA PLUS) w/Device KIT Use to test blood glucose 1-2 times daily. Dx Code E11.59 1 kit 0  . cyclobenzaprine (FLEXERIL) 10 MG tablet Take 1 tablet (10 mg total) by mouth 3 (three) times daily as needed for muscle spasms. 90 tablet 11  . diphenhydrAMINE (BENADRYL) 25 mg capsule Take 2 capsules (50 mg total) by mouth as directed. Take prior to CT scan as directed (Patient not taking: Reported on 12/03/2017) 30 capsule 0  . dronabinol (MARINOL) 2.5 MG capsule Take 1 capsule (2.5 mg total) by mouth 2 (two) times daily before a meal. 60 capsule 0  . glipiZIDE (GLUCOTROL) 10 MG tablet Take 1 tablet (10 mg total) by mouth 2 (two) times daily before a meal. 180 tablet 3  . glucose blood (ACCU-CHEK AVIVA) test strip Use to test blood glucose 1-2 times daily. Dx Code E11.59 100 each 12  . hydrOXYzine  (ATARAX/VISTARIL) 10 MG tablet Take 1 tablet (10 mg total) by mouth 3 (three) times daily as needed for itching. 30 tablet 0  . insulin aspart (NOVOLOG) 100 UNIT/ML FlexPen Inject 20 Units into the skin daily. Just before dinner 5 pen 1  . Insulin Glargine (LANTUS) 100 UNIT/ML Solostar Pen Inject 20 Units into the skin daily. 15 mL 3  . Insulin Pen Needle (PEN NEEDLES) 32G X 4 MM MISC 1 pen by Does not apply route 2 (two) times daily. Dx Code E11.59 200 each 6  . lidocaine-prilocaine (EMLA) cream Apply 1 application topically as needed. 30 g 0  . lisinopril (PRINIVIL,ZESTRIL) 5 MG tablet Take 0.5 tablets (2.5 mg total) by mouth daily. 90 tablet 3  . metFORMIN (GLUCOPHAGE-XR) 500 MG 24 hr tablet Take 1 tablet (500 mg total) by mouth 2 (two) times daily. 180 tablet 3  . metoCLOPramide (REGLAN) 10 MG tablet TAKE 1 TABLET BY MOUTH FOUR TIMES DAILY AS NEEDED AS DIRECTED (Patient not taking: Reported on 10/02/2017) 30 tablet 0  . ondansetron (ZOFRAN) 4 MG tablet Take 1 tablet (4 mg total) by mouth 3 (three) times daily. (Patient not taking: Reported on 12/03/2017) 20 tablet 0  . oxyCODONE-acetaminophen (PERCOCET) 10-325 MG tablet Take 1-2 tablets by mouth every 6 (six) hours as needed for pain. 240 tablet 0  . pantoprazole (PROTONIX) 40 MG tablet TAKE 1 TABLET(40 MG) BY MOUTH DAILY 90 tablet 1  . predniSONE (DELTASONE) 50 MG tablet TAKE 1 TABLET BY MOUTH AS DIRECTED. 1 TABLET  13 HOURS, 7 HOURS, AND 1 HOUR PRIOR TO SCAN (Patient not taking: Reported on 12/03/2017) 3 tablet 0  . prochlorperazine (COMPAZINE) 10 MG tablet Take 10 mg by mouth every 6 (six) hours as needed for nausea or vomiting.    . prochlorperazine (COMPAZINE) 25 MG suppository Place 1 suppository (25 mg total) rectally every 12 (twelve) hours as needed for refractory nausea / vomiting. (Patient not taking: Reported on 12/03/2017) 12 suppository 11  . senna-docusate (SENOKOT-S) 8.6-50 MG tablet Take 1-2 tablets by mouth 2 (two) times daily as  needed for mild constipation or moderate constipation. 30 tablet 1  . sitaGLIPtin (JANUVIA) 100 MG tablet Take 1 tablet (100 mg total) by mouth daily. 90 tablet 3  . sorbitol 70 % solution Take 15 cc every 4 hours until bowel movement (Patient not taking: Reported on 10/02/2017) 473 mL 0   No current facility-administered medications for this visit.     SURGICAL HISTORY:  Past Surgical History:  Procedure Laterality Date  . CAROTID STENT Right ?2014  . COLONOSCOPY N/A 11/30/2015   Procedure: COLONOSCOPY;  Surgeon: Teena Irani, MD;  Location[facility] ENDOSCOPY;  Service: Endoscopy;  Laterality: N/A;  . ESOPHAGOGASTRODUODENOSCOPY N/A 11/30/2015   Procedure: ESOPHAGOGASTRODUODENOSCOPY (EGD);  Surgeon: Teena Irani, MD;  Location: [facility] ENDOSCOPY;  Service: Endoscopy;  Laterality: N/A;  . ESOPHAGOGASTRODUODENOSCOPY (EGD) WITH PROPOFOL N/A 01/05/2016   Procedure: ESOPHAGOGASTRODUODENOSCOPY (EGD) WITH PROPOFOL;  Surgeon: Teena Irani, MD;  Location: [facility] ENDOSCOPY;  Service: Endoscopy;  Laterality: N/A;  . FEMORAL ARTERY STENT Left 05/2012; ~ 2015   Archie Endo 06/04/2012; Raechel Chute report  . FRACTURE SURGERY    . GIVENS CAPSULE STUDY N/A 12/22/2015   Procedure: GIVENS CAPSULE STUDY;  Surgeon: Wonda Horner, MD;  Location: [facility] ENDOSCOPY;  Service: Endoscopy;  Laterality: N/A;  . HARDWARE REMOVAL Right 11/15/2011   Removal of deep frontozygomatic orbital hardware/notes 11/15/2011  . HERNIA REPAIR  [id number]   Umbilical  . LAPAROSCOPY N/A 02/27/2016   Procedure: LAPAROSCOPY, LAPAROTOMY  WITH TWO SMALL BOWEL RESECTION;  Surgeon: Johnathan Hausen, MD;  Location: [facility] ORS;  Service: General;  Laterality: N/A;  . ORIF ORBITAL FRACTURE Right 08/15/2010    caught in a hydraulic machine; open reduction internal fixation of orbital rim fracture and open reduction of zygomatic arch fracture  Archie Endo 10/13/2009  . PORTACATH PLACEMENT Left 04/04/2016   Procedure: INSERTION PORT-A-CATH LEFT CHEST;  Surgeon: Melrose Nakayama, MD;  Location: [facility];  Service: Thoracic;  Laterality: Left;  Marland Kitchen VIDEO ASSISTED THORACOSCOPY (VATS)/ LOBECTOMY Right 10/18/2015   Procedure: VIDEO ASSISTED THORACOSCOPY (VATS)/ LOBECTOMY;  Surgeon: Melrose Nakayama, MD;  Location: [facility];  Service: Thoracic;  Laterality: Right;  Marland Kitchen VIDEO BRONCHOSCOPY Bilateral 09/21/2015   Procedure: VIDEO BRONCHOSCOPY WITH FLUORO;  Surgeon: Juanito Doom, MD;  Location: [facility] ENDOSCOPY;  Service: Cardiopulmonary;  Laterality: Bilateral;    REVIEW OF SYSTEMS:   Review of Systems  Constitutional: Negative for appetite change, chills, fatigue, fever and unexpected weight change.  HENT:   Negative for mouth sores, nosebleeds, sore throat and trouble swallowing.   Eyes: Negative for eye problems and icterus.  Respiratory: Negative for cough, hemoptysis, shortness of breath and wheezing.   Cardiovascular: Negative for chest pain and leg swelling.  Gastrointestinal: Negative for abdominal pain, constipation, diarrhea, nausea and vomiting.  Genitourinary: Negative for bladder incontinence, difficulty urinating, dysuria, frequency and hematuria.   Musculoskeletal: Negative for back pain, gait problem, neck pain and neck stiffness.  Skin: Negative for itching and rash.  Neurological:  Negative for dizziness, extremity weakness, gait problem, headaches, light-headedness and seizures.  Hematological: Negative for adenopathy. Does not bruise/bleed easily.  Psychiatric/Behavioral: Negative for confusion, depression and sleep disturbance. The patient is not nervous/anxious.     PHYSICAL EXAMINATION:  Blood pressure 94/62, pulse 89, temperature 98.3 F (36.8 C), temperature source Oral, resp. rate 18, height 5' 7"  (1.702 m), weight 113 lb (51.3 kg), SpO2 100 %.  ECOG PERFORMANCE STATUS: 1 - Symptomatic but completely ambulatory  Physical Exam  Constitutional: Oriented to person, place, and time and well-developed, well-nourished, and in no distress. No distress.  HENT:  Head:  Normocephalic and atraumatic.  Mouth/Throat: Oropharynx is clear and moist. No oropharyngeal exudate.  Eyes: Conjunctivae are normal. Right eye exhibits no discharge. Left eye exhibits no discharge. No scleral icterus.  Neck: Normal range of motion. Neck supple.  Cardiovascular: Normal rate, regular rhythm, normal heart sounds and intact distal pulses.   Pulmonary/Chest: Effort normal and breath sounds normal. No respiratory distress. No wheezes. No rales.  Abdominal: Soft. Bowel sounds are normal. Exhibits no distension and no mass. There is no tenderness.  Musculoskeletal: Normal range of motion. Exhibits no edema.  Lymphadenopathy:    No cervical adenopathy.  Neurological: Alert and oriented to person, place, and time. Exhibits normal muscle tone. Gait normal. Coordination normal.  Skin: Skin is warm and dry. No rash noted. Not diaphoretic. No erythema. No pallor.  Psychiatric: Mood, memory and judgment normal.  Vitals reviewed.  LABORATORY DATA: Lab Results  Component Value Date   WBC 7.2 12/25/2017   HGB 14.1 12/25/2017   HCT 43.7 12/25/2017   MCV 92.4 12/25/2017   PLT 232 12/25/2017      Chemistry      Component Value Date/Time   NA 142 12/25/2017 0859   NA 140 01/23/2017 0910   K 4.0 12/25/2017 0859   K 4.5 01/23/2017 0910   CL 106 12/25/2017 0859   CO2 26 12/25/2017 0859   CO2 28 01/23/2017 0910   BUN 8 12/25/2017 0859   BUN 8.1 01/23/2017 0910   CREATININE 0.76 12/25/2017 0859   CREATININE 0.8 01/23/2017 0910   GLU 398 (H) 08/08/2016 1257      Component Value Date/Time   CALCIUM 9.3 12/25/2017 0859   CALCIUM 9.6 01/23/2017 0910   ALKPHOS 89 12/25/2017 0859   ALKPHOS 113 01/23/2017 0910   AST 11 (L) 12/25/2017 0859   AST 11 01/23/2017 0910   ALT 8 12/25/2017 0859   ALT 12 01/23/2017 0910   BILITOT 0.3 12/25/2017 0859   BILITOT 0.40 01/23/2017 0910       RADIOGRAPHIC STUDIES:  Ct Chest W Contrast  Result Date: 12/02/2017 CLINICAL DATA:  History of  metastatic non-small cell right lung cancer metastatic to the small bowel. Ongoing Keytruda therapy. EXAM: CT CHEST, ABDOMEN, AND PELVIS WITH CONTRAST TECHNIQUE: Multidetector CT imaging of the chest, abdomen and pelvis was performed following the standard protocol during bolus administration of intravenous contrast. CONTRAST:  119m OMNIPAQUE IOHEXOL 300 MG/ML SOLN. The technologist reports minimal contrast infiltration (approximately 5 cc) at the patient's IV. The patient was premedicated according to the 13hour steroid and Benadryl prep due to a history of iodinated contrast allergy. There were no immediate complications. COMPARISON:  CT 09/09/2017 and 06/18/2017 FINDINGS: CT CHEST FINDINGS Cardiovascular: There is extensive coronary artery atherosclerosis with lesser involvement of the aorta and great vessels. Left subclavian Port-A-Cath terminates at the mid SVC. This catheter is directed posteriorly towards the azygos orifice on the sagittal  images, although does not appear to enter the azygos vein and is unchanged from the previous studies. The heart size is normal. There is no pericardial effusion. Mediastinum/Nodes: There are no enlarged mediastinal, hilar or axillary lymph nodes. The thyroid gland, trachea and esophagus demonstrate no significant findings. Lungs/Pleura: There is no pleural effusion or pneumothorax. Patient is status post right upper lobectomy. There are mild emphysematous changes. There is new clustered nodularity in the right lower lobe (images 47 through 53/4) and in the right middle lobe (images 79 through 85/4), likely inflammatory. Tiny left upper lobe nodule (image 33/4) is stable. Small nodular density laterally at the left costophrenic angle (image 123/4) is likely atelectasis based on the appearance on the reformatted images. No suspicious pulmonary nodules. Mild underlying centrilobular emphysema. Musculoskeletal/Chest wall: No chest wall mass or suspicious osseous findings. CT  ABDOMEN AND PELVIS FINDINGS Hepatobiliary: The liver is normal in density without focal abnormality. There are small calcified gallstones. No gallbladder wall thickening, surrounding inflammation or biliary dilatation. Pancreas: Unremarkable. No pancreatic ductal dilatation or surrounding inflammatory changes. Spleen: Normal in size without focal abnormality. Adrenals/Urinary Tract: Both adrenal glands appear normal. Probable tiny nonobstructing calculus in the upper pole of the right kidney, best seen on coronal image 95/5. The kidneys otherwise appear normal without additional urinary tract calculi, suspicious lesion or hydronephrosis. No bladder abnormalities are seen. Stomach/Bowel: No evidence of bowel wall thickening, distention or surrounding inflammatory change. No recurrent small bowel masses are identified status post apparent partial small bowel resection and anastomosis in the right mid abdomen. The appendix appears normal. There is prominent stool throughout the colon. Vascular/Lymphatic: There are no enlarged abdominal or pelvic lymph nodes. 9 mm central mesenteric node on image 73/2 is stable there is moderate aortic and branch vessel atherosclerosis. No acute vascular findings. Reproductive: Stable appearance of the prostate gland and seminal vesicles. Other: No evidence of abdominal wall mass or hernia. There is no ascites or peritoneal nodularity. Musculoskeletal: No acute or significant osseous findings. IMPRESSION: 1. No evidence of local thoracic recurrence or metastatic disease in the chest, abdomen or pelvis. 2. New clustered pulmonary nodularity in the right middle and lower lobes, likely inflammatory. Given the patient's history, attention on follow-up necessary. 3. Port-A-Cath tip directed posteriorly towards the orifice of the azygos vein, although appears unchanged. 4. Coronary and Aortic Atherosclerosis (ICD10-I70.0). Electronically Signed   By: Richardean Sale M.D.   On: 12/02/2017  15:25   Ct Abdomen Pelvis W Contrast  Result Date: 12/02/2017 CLINICAL DATA:  History of metastatic non-small cell right lung cancer metastatic to the small bowel. Ongoing Keytruda therapy. EXAM: CT CHEST, ABDOMEN, AND PELVIS WITH CONTRAST TECHNIQUE: Multidetector CT imaging of the chest, abdomen and pelvis was performed following the standard protocol during bolus administration of intravenous contrast. CONTRAST:  17m OMNIPAQUE IOHEXOL 300 MG/ML SOLN. The technologist reports minimal contrast infiltration (approximately 5 cc) at the patient's IV. The patient was premedicated according to the 13hour steroid and Benadryl prep due to a history of iodinated contrast allergy. There were no immediate complications. COMPARISON:  CT 09/09/2017 and 06/18/2017 FINDINGS: CT CHEST FINDINGS Cardiovascular: There is extensive coronary artery atherosclerosis with lesser involvement of the aorta and great vessels. Left subclavian Port-A-Cath terminates at the mid SVC. This catheter is directed posteriorly towards the azygos orifice on the sagittal images, although does not appear to enter the azygos vein and is unchanged from the previous studies. The heart size is normal. There is no pericardial effusion. Mediastinum/Nodes: There are  no enlarged mediastinal, hilar or axillary lymph nodes. The thyroid gland, trachea and esophagus demonstrate no significant findings. Lungs/Pleura: There is no pleural effusion or pneumothorax. Patient is status post right upper lobectomy. There are mild emphysematous changes. There is new clustered nodularity in the right lower lobe (images 47 through 53/4) and in the right middle lobe (images 79 through 85/4), likely inflammatory. Tiny left upper lobe nodule (image 33/4) is stable. Small nodular density laterally at the left costophrenic angle (image 123/4) is likely atelectasis based on the appearance on the reformatted images. No suspicious pulmonary nodules. Mild underlying centrilobular  emphysema. Musculoskeletal/Chest wall: No chest wall mass or suspicious osseous findings. CT ABDOMEN AND PELVIS FINDINGS Hepatobiliary: The liver is normal in density without focal abnormality. There are small calcified gallstones. No gallbladder wall thickening, surrounding inflammation or biliary dilatation. Pancreas: Unremarkable. No pancreatic ductal dilatation or surrounding inflammatory changes. Spleen: Normal in size without focal abnormality. Adrenals/Urinary Tract: Both adrenal glands appear normal. Probable tiny nonobstructing calculus in the upper pole of the right kidney, best seen on coronal image 95/5. The kidneys otherwise appear normal without additional urinary tract calculi, suspicious lesion or hydronephrosis. No bladder abnormalities are seen. Stomach/Bowel: No evidence of bowel wall thickening, distention or surrounding inflammatory change. No recurrent small bowel masses are identified status post apparent partial small bowel resection and anastomosis in the right mid abdomen. The appendix appears normal. There is prominent stool throughout the colon. Vascular/Lymphatic: There are no enlarged abdominal or pelvic lymph nodes. 9 mm central mesenteric node on image 73/2 is stable there is moderate aortic and branch vessel atherosclerosis. No acute vascular findings. Reproductive: Stable appearance of the prostate gland and seminal vesicles. Other: No evidence of abdominal wall mass or hernia. There is no ascites or peritoneal nodularity. Musculoskeletal: No acute or significant osseous findings. IMPRESSION: 1. No evidence of local thoracic recurrence or metastatic disease in the chest, abdomen or pelvis. 2. New clustered pulmonary nodularity in the right middle and lower lobes, likely inflammatory. Given the patient's history, attention on follow-up necessary. 3. Port-A-Cath tip directed posteriorly towards the orifice of the azygos vein, although appears unchanged. 4. Coronary and Aortic  Atherosclerosis (ICD10-I70.0). Electronically Signed   By: Richardean Sale M.D.   On: 12/02/2017 15:25     ASSESSMENT/PLAN:  Primary adenocarcinoma of right lung [facility]) This is a very pleasant 57 year old white male with metastatic non-small cell lung cancer, adenocarcinoma status post right upper lobectomy with lymph node dissection. Unfortunately the patient was found to have metastatic disease in the jejunum and terminal ileum.  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 The patient was started on treatment with systemic chemotherapy with carboplatin and Alimta for 2 cycles discontinued secondary to intolerance and disease progression. The patient is currently undergoing treatment with second line immunotherapy with Ketruda (pembrolizumab) 200 mg IV every 3 weeks, status post 27cycles. The patient continues to tolerate this treatment well with no concerning adverse effects.  Recommend for him to proceed with cycle #28 today as scheduled.  He will follow-up in 3 weeks for evaluation prior to cycle #29.  For his diabetes, he will continue to follow-up with his primary care provider for ongoing management of his medications.  He was advised to call immediately if he has any concerning symptoms in the interval. The patient voices understanding of current disease status and treatment options and is  in agreement with the current care plan. All questions were answered. The patient knows to call the clinic with any problems, questions or concerns. We can certainly see the patient much sooner if necessary.   No orders of the defined types were placed in this encounter.    Mikey Bussing, DNP, AGPCNP-BC, AOCNP 12/25/17

## 2017-12-25 NOTE — Patient Instructions (Signed)
[facility] Discharge Instructions for Patients Receiving Chemotherapy  Today you received the following chemotherapy agents:  Keytruda.  To help prevent nausea and vomiting after your treatment, we encourage you to take your nausea medication as directed.   If you develop nausea and vomiting that is not controlled by your nausea medication, call the clinic.   BELOW ARE SYMPTOMS THAT SHOULD BE REPORTED IMMEDIATELY:  *FEVER GREATER THAN 100.5 F  *CHILLS WITH OR WITHOUT FEVER  NAUSEA AND VOMITING THAT IS NOT CONTROLLED WITH YOUR NAUSEA MEDICATION  *UNUSUAL SHORTNESS OF BREATH  *UNUSUAL BRUISING OR BLEEDING  TENDERNESS IN MOUTH AND THROAT WITH OR WITHOUT PRESENCE OF ULCERS  *URINARY PROBLEMS  *BOWEL PROBLEMS  UNUSUAL RASH Items with * indicate a potential emergency and should be followed up as soon as possible.  Feel free to call the clinic should you have any questions or concerns. The clinic phone number is [phone].  Please show the CHEMO ALERT CARD at check-in to the Emergency Department and triage nurse.    

## 2017-12-26 ENCOUNTER — Telehealth: Payer: Self-pay | Admitting: Oncology

## 2017-12-26 ENCOUNTER — Encounter: Payer: Self-pay | Admitting: Internal Medicine

## 2017-12-26 ENCOUNTER — Ambulatory Visit (INDEPENDENT_AMBULATORY_CARE_PROVIDER_SITE_OTHER): Payer: PPO | Admitting: Internal Medicine

## 2017-12-26 DIAGNOSIS — E1159 Type 2 diabetes mellitus with other circulatory complications: Secondary | ICD-10-CM | POA: Diagnosis not present

## 2017-12-26 DIAGNOSIS — R58 Hemorrhage, not elsewhere classified: Secondary | ICD-10-CM | POA: Diagnosis not present

## 2017-12-26 DIAGNOSIS — R21 Rash and other nonspecific skin eruption: Secondary | ICD-10-CM

## 2017-12-26 DIAGNOSIS — H9201 Otalgia, right ear: Secondary | ICD-10-CM | POA: Diagnosis not present

## 2017-12-26 DIAGNOSIS — L299 Pruritus, unspecified: Secondary | ICD-10-CM | POA: Diagnosis not present

## 2017-12-26 DIAGNOSIS — Z794 Long term (current) use of insulin: Secondary | ICD-10-CM

## 2017-12-26 NOTE — Patient Instructions (Signed)
It was great to see you again!  1) Keep taking medications just like you are.  2) Try hydrocortisone 1% cream on the rash to see if it works.  3) You can try benadryl or atarax for the itching if you need it.  I will see you as already scheduled.

## 2017-12-26 NOTE — Progress Notes (Signed)
   Subjective:    Patient ID: Brett [name], male    DOB: 03/14/1960, 57 y.o.   MRN: [id number]  HPI  RAHMIR BEEVER is here for follow-up of his glycemic control and pruritic rash. Please see the A&P for the status of the pt's chronic medical problems.  His only acute complaint today is some tenderness at the external auricle of the right ear.  The pain has been intermittent for a couple of days.  He denies any trauma but did have a scab that came off spontaneously.  Examination of the area fails to reveal any abnormalities.  A review of the Keytruda side effects does not list a chondritis.  This will have to be followed clinically.  He is with out other acute complaints.  Review of Systems  Constitutional: Positive for appetite change and fatigue. Negative for fever and unexpected weight change.       Improved appetite as of late.  Eyes: Positive for visual disturbance.       Blurry vision only if sugars get high.  Respiratory: Negative for chest tightness, shortness of breath and wheezing.   Cardiovascular: Negative for chest pain and leg swelling.  Endocrine: Negative for polydipsia and polyuria.  Genitourinary: Positive for decreased urine volume.  Skin: Positive for rash.       With pruritis  Psychiatric/Behavioral: Positive for dysphoric mood.       With brother's recent death      Objective:   Physical Exam  Constitutional: He is oriented to person, place, and time. He appears well-developed. No distress.  HENT:  Head: Normocephalic and atraumatic.  Eyes: Conjunctivae are normal. Right eye exhibits no discharge. Left eye exhibits no discharge. No scleral icterus.  Musculoskeletal: Normal range of motion.  Neurological: He is alert and oriented to person, place, and time.  Skin: Skin is warm and dry. Rash noted. He is not diaphoretic.  Left foerarm with eccymosis  Psychiatric: He has a normal mood and affect. His behavior is normal. Judgment and thought content normal.    Nursing note and vitals reviewed.     Assessment & Plan:   Please see problem oriented charting.

## 2017-12-26 NOTE — Telephone Encounter (Signed)
3 cycles scheduled already per 12/5 los.

## 2017-12-26 NOTE — Assessment & Plan Note (Signed)
Assessment  He continues to have a periodic paretic rash of his forearms and legs.  It is more pruritic than rash.  I suspect what he does have after scratching are excoriations.  These areas of pruritus are not in an area that would be consistent with contact to a particular agent.  Review of the Keytruda side effects that are common include pruritus and rash and my suspicion is that this is a manifestation of the common reactions to the [facility] rather than some other etiology.  Plan  Obviously, given his excellent response to the [facility] this must continue.  He has some hydrocortisone cream 1% at home and was asked to place some over the pruritic areas when it occurs to see if this can help with the pruritus.  In addition, he was told he could take either his Benadryl or his Atarax in case he were to develop pruritus to see if that would also help him symptomatically.  We will reassess the efficacy of these symptomatic therapies at the follow-up visit.

## 2017-12-26 NOTE — Assessment & Plan Note (Signed)
Assessment  We reviewed his glucose meter download today.  It includes readings as far back as November 6 which was before the last visit where we made further adjustments in his insulin regimen.  Looking at the readings after we made the adjustments in his regimen it is notable that his morning sugars tend to be variable and range from the 80s to 320s with most being in the 150-180 range.  His nighttime readings also are improved after the last visit adjustments and ranged between 76 and 519 although the high reading is somewhat elevated as most other readings are between the 130s and 190s.  There have been no hypoglycemic readings since the last visit.  He also notes no more polyuria or polydipsia and states that his blurry vision only occurs when his sugars are very high.  This has not been very often.  His current regimen includes Lantus 20 units q. a.m. and NovoLog 20 units prior to dinner.  In addition, he is on glipizide 10 mg by mouth twice daily, Januvia 100 mg by mouth daily, and metformin 500 mg by mouth twice daily.  He has had no change in his chronic vascular symptoms of the left lower extremity.  Plan  We will continue the Lantus at 20 units every morning and NovoLog at 20 units prior to dinner.  We will also continue the glipizide 10 mg by mouth twice daily, metformin 500 mg by mouth twice daily, and Januvia 100 mg by mouth daily.  He is already scheduled to see me in 2 weeks and at that point we will check a hemoglobin A1c.  We must realize that this will reflect a month and a half of very high sugars and a subsequent month and a half of much improved glycemic control.  Therefore, I expect the hemoglobin A1c will be above 7 but I am hopeful that it will be well below 9 given that it was above the threshold of 14 at the last check 2-1/2 months ago.  With regards to the associated vascular disease we are continuing aggressive management of his hyperglycemia and continuing his high intensity  statin.

## 2018-01-09 ENCOUNTER — Encounter: Payer: Self-pay | Admitting: Internal Medicine

## 2018-01-09 ENCOUNTER — Ambulatory Visit (INDEPENDENT_AMBULATORY_CARE_PROVIDER_SITE_OTHER): Payer: PPO | Admitting: Internal Medicine

## 2018-01-09 VITALS — BP 95/73 | HR 73 | Temp 97.6°F | Wt 114.7 lb

## 2018-01-09 DIAGNOSIS — E440 Moderate protein-calorie malnutrition: Secondary | ICD-10-CM | POA: Diagnosis not present

## 2018-01-09 DIAGNOSIS — A084 Viral intestinal infection, unspecified: Secondary | ICD-10-CM

## 2018-01-09 DIAGNOSIS — E1159 Type 2 diabetes mellitus with other circulatory complications: Secondary | ICD-10-CM | POA: Diagnosis not present

## 2018-01-09 DIAGNOSIS — Z681 Body mass index (BMI) 19 or less, adult: Secondary | ICD-10-CM | POA: Diagnosis not present

## 2018-01-09 DIAGNOSIS — Z79891 Long term (current) use of opiate analgesic: Secondary | ICD-10-CM

## 2018-01-09 DIAGNOSIS — Z794 Long term (current) use of insulin: Secondary | ICD-10-CM | POA: Diagnosis not present

## 2018-01-09 DIAGNOSIS — G894 Chronic pain syndrome: Secondary | ICD-10-CM | POA: Diagnosis not present

## 2018-01-09 LAB — POCT GLYCOSYLATED HEMOGLOBIN (HGB A1C): Hemoglobin A1C: 11.6 % — AB (ref 4.0–5.6)

## 2018-01-09 LAB — GLUCOSE, CAPILLARY: Glucose-Capillary: 266 mg/dL — ABNORMAL HIGH (ref 70–99)

## 2018-01-09 MED ORDER — INSULIN ASPART 100 UNIT/ML FLEXPEN: 20.0000 [IU] | PEN_INJECTOR | SUBCUTANEOUS | 3 refills | Status: DC

## 2018-01-09 MED ORDER — ATORVASTATIN CALCIUM 40 MG PO TABS: 40.0000 mg | ORAL_TABLET | Freq: Every day | ORAL | 3 refills | Status: DC

## 2018-01-09 MED ORDER — OXYCODONE-ACETAMINOPHEN 10-325 MG PO TABS: 1.0000 | ORAL_TABLET | Freq: Four times a day (QID) | ORAL | 0 refills | Status: DC | PRN

## 2018-01-09 MED ORDER — SITAGLIPTIN PHOSPHATE 100 MG PO TABS: 100.0000 mg | ORAL_TABLET | Freq: Every day | ORAL | 3 refills | Status: DC

## 2018-01-09 MED ORDER — INSULIN GLARGINE 100 UNIT/ML SOLOSTAR PEN: 20.0000 [IU] | PEN_INJECTOR | Freq: Every day | SUBCUTANEOUS | 3 refills | Status: DC

## 2018-01-09 NOTE — Assessment & Plan Note (Signed)
Assessment  His watery diarrhea over the last 3 days is likely related to a viral enteritis as he has not had any recent antibiotics or been exposed to unusual food or individuals with similar symptoms.  Fortunately, he has no nausea and vomiting and has been able to keep himself orally fed and hydrated.  Plan  We will treat symptomatically by watching and waiting.  If the diarrhea does not resolve within the next 2 days he was asked to call the clinic for an evaluation.

## 2018-01-09 NOTE — Progress Notes (Signed)
   Subjective:    Patient ID: Brett [name], male    DOB: December 05, 1960, 57 y.o.   MRN: [id number]  HPI  Brett [name] is here for follow-up of his type 2 diabetes with vascular disease, chronic pain syndrome, and moderate protein-calorie malnutrition. Please see the A&P for the status of the pt's chronic medical problems.  His only acute complaint today is a 3-day history of watery diarrhea from 3-4 times per day.  He denies any recent antibiotics or unusual foods.  No one else in his family has similar symptoms.  He denies any fevers, shakes, chills, nausea, or vomiting.  He is able to keep himself orally hydrated.  There is no blood in the stool.  He also denies any sweats or flushing with the episodes of diarrhea.  Review of Systems  Constitutional: Negative for appetite change, chills, diaphoresis, fever and unexpected weight change.  HENT: Positive for ear pain.        Right helix pain.  Gastrointestinal: Positive for diarrhea. Negative for abdominal pain, blood in stool, constipation, nausea and vomiting.  Musculoskeletal: Positive for arthralgias and back pain. Negative for myalgias.  Skin:       Pruritis  Neurological: Negative for dizziness and light-headedness.      Objective:   Physical Exam Vitals signs and nursing note reviewed.  Constitutional:      General: He is not in acute distress.    Appearance: Normal appearance. He is not ill-appearing, toxic-appearing or diaphoretic.  HENT:     Head: Normocephalic.     Ears:     Comments: Mild erythema on right ear helix. Eyes:     General: No scleral icterus.       Right eye: No discharge.        Left eye: No discharge.  Pulmonary:     Effort: Pulmonary effort is normal.  Skin:    Findings: Lesion present. No erythema or rash.  Neurological:     Mental Status: He is alert and oriented to person, place, and time. Mental status is at baseline.  Psychiatric:        Mood and Affect: Mood normal.        Behavior: Behavior  normal.        Thought Content: Thought content normal.        Judgment: Judgment normal.       Assessment & Plan:   Please see problem oriented charting.

## 2018-01-09 NOTE — Assessment & Plan Note (Signed)
Assessment  Although his appetite had been improving he has lost 1 pound since the visit 2 weeks ago.  This is likely related to his recent enteritis with diarrhea as he has been able to keep himself well nourished and hydrated despite the diarrhea.  Plan  He was encouraged to continue eating what he could in order to improve his nutritional status.  We will reassess his weight at the follow-up visit.

## 2018-01-09 NOTE — Patient Instructions (Signed)
I am sorry you are not feeling well.  If you are not better by Sunday give the clinic a call for an appointment on Monday.  1) Keep taking your medications as you are.  2) I renewed several of your medications as needed.  I will see you back in 3 months.  Sooner if necessary.

## 2018-01-09 NOTE — Assessment & Plan Note (Signed)
Assessment  Review of his blood sugars over the last month reveal an average glucose of 176.  26% of his readings were within target and only 1 was below target at 69.  It should be noted that his target is set at 140.  There are very few readings over 250.  That said, there does not appear to be a pattern with regards to the very highs whether they are in the morning or in the evening.  His hemoglobin A1c today was 11.6 which is down from above 14 3 months ago.  Although one would hope for a better hemoglobin A1c it must be remembered that 1 month ago he received his iodine contrast prep which included high-dose steroids resulting in markedly elevated sugars for a period of 2 to 3 days.  Thus I am not surprised we are above target although I would have like to have seen the A1c in the single digit range.  His chronic lower extremity pain associated with his peripheral vascular occlusive disease is unchanged.  Plan  Despite the hemoglobin A1c his measured blood sugars are near our target and he has minimal hypoglycemic episodes.  Therefore, I would like to continue with his current diabetic regimen which includes Lantus 20 units in the morning, NovoLog 20 units before dinner, glipizide 10 mg by mouth twice daily, metformin XR 500 mg by mouth twice daily, and Januvia 100 mg by mouth daily.  We will reassess glycemic control in 3 months with a repeat hemoglobin A1c realizing that in that interim he will also receive another course of prednisone for his contrast allergy prior to undergoing a CT scan.  For the peripheral vascular occlusive disease we are continuing with the high intensity statin and will continue to encourage tobacco cessation.

## 2018-01-09 NOTE — Assessment & Plan Note (Signed)
Assessment  His chronic pain syndrome is well controlled on the as needed oxycodone-acetaminophen 10-325 mg 1 to 2 tablets every 6 hours as needed for pain dispense number 240/month.  This allows him to function throughout the day.  Plan  We will continue the oxycodone-acetaminophen 10-325 mg 1 to 2 tablets every 6 hours as needed for pain dispense number 240/month.  Prescriptions for the next 3 months were sent electronically to the pharmacy.  We will reassess the efficacy of this therapy at the follow-up visit.  Please note that a UDS within the last 6-7 months was appropriate.

## 2018-01-15 ENCOUNTER — Inpatient Hospital Stay: Payer: PPO

## 2018-01-15 ENCOUNTER — Telehealth: Payer: Self-pay | Admitting: Internal Medicine

## 2018-01-15 ENCOUNTER — Encounter: Payer: Self-pay | Admitting: Internal Medicine

## 2018-01-15 ENCOUNTER — Other Ambulatory Visit: Payer: Self-pay

## 2018-01-15 ENCOUNTER — Inpatient Hospital Stay (HOSPITAL_BASED_OUTPATIENT_CLINIC_OR_DEPARTMENT_OTHER): Payer: PPO | Admitting: Internal Medicine

## 2018-01-15 VITALS — BP 102/74 | HR 92 | Temp 98.7°F | Resp 18 | Ht 67.0 in | Wt 115.1 lb

## 2018-01-15 DIAGNOSIS — C3491 Malignant neoplasm of unspecified part of right bronchus or lung: Secondary | ICD-10-CM

## 2018-01-15 DIAGNOSIS — Z5112 Encounter for antineoplastic immunotherapy: Secondary | ICD-10-CM

## 2018-01-15 DIAGNOSIS — C3411 Malignant neoplasm of upper lobe, right bronchus or lung: Secondary | ICD-10-CM | POA: Diagnosis not present

## 2018-01-15 DIAGNOSIS — C784 Secondary malignant neoplasm of small intestine: Secondary | ICD-10-CM | POA: Diagnosis not present

## 2018-01-15 DIAGNOSIS — C171 Malignant neoplasm of jejunum: Secondary | ICD-10-CM

## 2018-01-15 LAB — TSH: TSH: 2.288 u[IU]/mL (ref 0.320–4.118)

## 2018-01-15 LAB — CBC WITH DIFFERENTIAL (CANCER CENTER ONLY)
Abs Immature Granulocytes: 0.03 10*3/uL (ref 0.00–0.07)
Basophils Absolute: 0 10*3/uL (ref 0.0–0.1)
Basophils Relative: 1 %
Eosinophils Absolute: 0.3 10*3/uL (ref 0.0–0.5)
Eosinophils Relative: 4 %
HCT: 46.4 % (ref 39.0–52.0)
Hemoglobin: 15 g/dL (ref 13.0–17.0)
Immature Granulocytes: 1 %
Lymphocytes Relative: 21 %
Lymphs Abs: 1.4 10*3/uL (ref 0.7–4.0)
MCH: 29.3 pg (ref 26.0–34.0)
MCHC: 32.3 g/dL (ref 30.0–36.0)
MCV: 90.6 fL (ref 80.0–100.0)
Monocytes Absolute: 0.5 10*3/uL (ref 0.1–1.0)
Monocytes Relative: 8 %
Neutro Abs: 4.3 10*3/uL (ref 1.7–7.7)
Neutrophils Relative %: 65 %
Platelet Count: 208 10*3/uL (ref 150–400)
RBC: 5.12 MIL/uL (ref 4.22–5.81)
RDW: 13.4 % (ref 11.5–15.5)
WBC Count: 6.5 10*3/uL (ref 4.0–10.5)
nRBC: 0 % (ref 0.0–0.2)

## 2018-01-15 LAB — CMP (CANCER CENTER ONLY)
ALT: 13 U/L (ref 0–44)
AST: 15 U/L (ref 15–41)
Albumin: 3.9 g/dL (ref 3.5–5.0)
Alkaline Phosphatase: 101 U/L (ref 38–126)
Anion gap: 7 (ref 5–15)
BUN: 6 mg/dL (ref 6–20)
CO2: 26 mmol/L (ref 22–32)
Calcium: 9.2 mg/dL (ref 8.9–10.3)
Chloride: 107 mmol/L (ref 98–111)
Creatinine: 0.8 mg/dL (ref 0.61–1.24)
GFR, Est AFR Am: >60 mL/min (ref >60)
GFR, Estimated: >60 mL/min (ref >60)
Glucose, Bld: 222 mg/dL — ABNORMAL HIGH (ref 70–99)
Potassium: 4.1 mmol/L (ref 3.5–5.1)
Sodium: 140 mmol/L (ref 135–145)
Total Bilirubin: 0.4 mg/dL (ref 0.3–1.2)
Total Protein: 7 g/dL (ref 6.5–8.1)

## 2018-01-15 MED ORDER — SODIUM CHLORIDE 0.9% FLUSH
10.0000 mL | INTRAVENOUS | Status: DC | PRN
  Administered 2018-01-15: 10 mL
  Filled 2018-01-15: qty 10

## 2018-01-15 MED ORDER — SODIUM CHLORIDE 0.9 % IV SOLN
200.0000 mg | Freq: Once | INTRAVENOUS | Status: AC
  Administered 2018-01-15: 200 mg via INTRAVENOUS
  Filled 2018-01-15: qty 8

## 2018-01-15 MED ORDER — HEPARIN SOD (PORK) LOCK FLUSH 100 UNIT/ML IV SOLN
500.0000 [IU] | Freq: Once | INTRAVENOUS | Status: AC | PRN
  Administered 2018-01-15: 500 [IU]
  Filled 2018-01-15: qty 5

## 2018-01-15 MED ORDER — SODIUM CHLORIDE 0.9 % IV SOLN
Freq: Once | INTRAVENOUS | Status: AC
  Administered 2018-01-15: 11:00:00 via INTRAVENOUS
  Filled 2018-01-15: qty 250

## 2018-01-15 NOTE — Telephone Encounter (Signed)
Added another cycle per 12/26 los- pt to get an updated schedule next visit.

## 2018-01-15 NOTE — Progress Notes (Signed)
[facility] [phone]   [phone]  OFFICE PROGRESS NOTE  [name], MD [address] [place] [address]  DIAGNOSIS: Stage IV (T1b, N0, M1b) non-small cell lung cancer, adenocarcinoma presented with right upper lobe lung nodule and recent metastasis to the small intestine. This was initially diagnosed in September 2017.  Genomic Alterations Identified? ERBB2 amplification - equivocal? CDKN2A p16INK4a E88* and p14ARF E527P SMARCA4 splice site [id number]>RX SPTA1 E2022* TOP2A amplification TP53 A159P Additional Findings? Microsatellite status MS-Stable Tumor Mutation Burden TMB-Intermediate; 18 Muts/Mb Additional Disease-relevant Genes with No Reportable Alterations Identified? EGFR KRAS ALK BRAF MET RET ROS1   PRIOR THERAPY:  1) Status post right VATS with right upper lobectomy and mediastinal lymph node dissection under the care of Dr. Roxan Hockey on 10/18/2015 and the final pathology was consistent with stage IA (T1b, N0, MX). 2) upper endoscopy on 01/05/2016 showed normal esophagus, normal stomach but there was occasional mass around 3.0 CM in length circumferential nonobstructing in the jejunum. The final pathology was consistent with metastatic adenocarcinoma. 3) status post laparoscopic laparotomy and resection of proximal lesion and and distal jejunum/proximal ileum under the care of Dr. Hassell Done 1 02/27/2016. 3)  Systemic chemotherapy with carboplatin for AUC of 5 and Alimta 500 MG/M2 every 3 weeks. First dose 04/04/2016. Status post 2 cycles. Last dose was given 04/21/2016 discontinued secondary to disease progression.   CURRENT THERAPY: Second line immunotherapy with Ketruda 200 mg IV every 2 weeks, first dose 05/30/2016. Status post 28 cycles.  INTERVAL HISTORY: Brett [name] 57 y.o. male returns to the clinic today for follow-up visit.  The patient is feeling fine today with no concerning complaints.  He denied  having any chest pain, shortness of breath, cough or hemoptysis.  He denied having any fever or chills.  He has no nausea, vomiting, diarrhea or constipation.  He has no headache or visual changes.  He denied having any fever or chills.  He has no weight loss or night sweats.  He is here today for evaluation before starting cycle #29.   MEDICAL HISTORY: Past Medical History:  Diagnosis Date  . Barrett's esophagus 07/01/2013   Without dysplasia on biopsy 09/03/2012. Repeat EGD recommended 08/2015  . Bilateral cataracts 02/13/2017  . Carotid artery stenosis 07/01/2013   Requiring right sided stent   . Chronic pain syndrome 07/01/2013  . Closed head injury with brief loss of consciousness ([place]) 07/22/2010   Head trapped in a hydraulic device at work.  Fracture of orbital bones on right and brief loss of consciousness per report.  . 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 There was likely a strong psycho-social overlay in regard to the cognitive deficits in the form of mood disorder with psychotic features and mixed anxiety symptomatology. His primary tested cognitive deficits are in the areas of attention, executi  . Daily headache "since 07/2010"   constantly  . Degenerative joint disease of cervical spine 07/01/2013  . Dupuytren's contracture of both hands 04/08/2014  . Encounter for antineoplastic chemotherapy 10/05/2015  . Encounter for antineoplastic immunotherapy 05/24/2016  . Erectile dysfunction associated with type 2 diabetes mellitus ([place]) 07/01/2013  . Fibromyalgia 07/01/2013  . Goals of care, counseling/discussion 03/28/2016  . History of blood transfusion 11/28/2015   "suppose to get his first today" (11/28/2015)  . Hyperlipidemia LDL goal < 100 07/01/2013  . Intractable  hiccups 04/11/2016  . 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 Memory changes     "memory issues" from head injury  . Moderate protein-calorie malnutrition ([place]) 11/29/2015  . Osteoarthritis of right thumb 10/21/2014  . Peripheral vascular occlusive disease ([place]) 07/01/2013   Requiring 2 arterial stents above the left knee per report  . Pneumonia ~ 2006/2007  . Post traumatic stress disorder 07/01/2013  . Primary lung adenocarcinoma ([place]) dx'd 08/2015   "right lung"  . Severe major depression with psychotic features ([place]) 04/15/2011  . Tobacco abuse 07/01/2013  . Type 2 diabetes mellitus with vascular disease ([place]) 07/01/2013   Left lower extremity and right carotid stenting    ALLERGIES:  is allergic to gabapentin; lyrica [pregabalin]; jardiance [empagliflozin]; celebrex [celecoxib]; and contrast media [iodinated diagnostic agents].  MEDICATIONS:  Current Outpatient Medications  Medication Sig Dispense Refill  . ACCU-CHEK SOFTCLIX LANCETS lancets Use to test blood glucose 1-2 times daily. Dx Code E11.59 100 each 12  . aspirin EC 81 MG tablet Take 81 mg by mouth daily.     Marland Kitchen atorvastatin (LIPITOR) 40 MG tablet Take 1 tablet (40 mg total) by mouth daily. 90 tablet 3  . Blood Glucose Monitoring Suppl (ACCU-CHEK AVIVA PLUS) w/Device KIT Use to test blood glucose 1-2 times daily. Dx Code E11.59 1 kit 0  . cyclobenzaprine (FLEXERIL) 10 MG tablet Take 1 tablet (10 mg total) by mouth 3 (three) times daily as needed for muscle spasms. 90 tablet 11  . diphenhydrAMINE (BENADRYL) 25 mg capsule Take 2 capsules (50 mg total) by mouth as directed. Take prior to CT scan as directed (Patient not taking: Reported on 12/03/2017) 30 capsule 0  . dronabinol (MARINOL) 2.5 MG capsule Take 1 capsule (2.5 mg total) by mouth 2 (two) times daily before a meal. 60 capsule 0  . glipiZIDE (GLUCOTROL) 10 MG tablet Take 1 tablet (10 mg total) by mouth 2 (two) times daily before a meal. 180 tablet 3  . glucose blood (ACCU-CHEK AVIVA) test strip Use to test blood glucose 1-2 times daily. Dx Code E11.59 100  each 12  . hydrOXYzine (ATARAX/VISTARIL) 10 MG tablet Take 1 tablet (10 mg total) by mouth 3 (three) times daily as needed for itching. 30 tablet 0  . insulin aspart (NOVOLOG) 100 UNIT/ML FlexPen Inject 20 Units into the skin daily. Just before dinner 5 pen 3  . Insulin Glargine (LANTUS) 100 UNIT/ML Solostar Pen Inject 20 Units into the skin daily. 15 mL 3  . Insulin Pen Needle (PEN NEEDLES) 32G X 4 MM MISC 1 pen by Does not apply route 2 (two) times daily. Dx Code E11.59 200 each 6  . lidocaine-prilocaine (EMLA) cream Apply 1 application topically as needed. 30 g 0  . lisinopril (PRINIVIL,ZESTRIL) 5 MG tablet Take 0.5 tablets (2.5 mg total) by mouth daily. 90 tablet 3  . metFORMIN (GLUCOPHAGE-XR) 500 MG 24 hr tablet Take 1 tablet (500 mg total) by mouth 2 (two) times daily. 180 tablet 3  . metoCLOPramide (REGLAN) 10 MG tablet TAKE 1 TABLET BY MOUTH FOUR TIMES DAILY AS NEEDED AS DIRECTED (Patient not taking: Reported on 10/02/2017) 30 tablet 0  . ondansetron (ZOFRAN) 4 MG tablet Take 1 tablet (4 mg total) by mouth 3 (three) times daily. (Patient not taking: Reported on 12/03/2017) 20 tablet 0  . [START ON 03/11/2018] oxyCODONE-acetaminophen (PERCOCET) 10-325 MG tablet Take 1-2 tablets by mouth every 6 (six) hours as needed for pain. 240 tablet 0  . pantoprazole (PROTONIX) 40 MG tablet  TAKE 1 TABLET(40 MG) BY MOUTH DAILY 90 tablet 1  . predniSONE (DELTASONE) 50 MG tablet TAKE 1 TABLET BY MOUTH AS DIRECTED. 1 TABLET 13 HOURS, 7 HOURS, AND 1 HOUR PRIOR TO SCAN (Patient not taking: Reported on 12/03/2017) 3 tablet 0  . prochlorperazine (COMPAZINE) 10 MG tablet Take 10 mg by mouth every 6 (six) hours as needed for nausea or vomiting.    . prochlorperazine (COMPAZINE) 25 MG suppository Place 1 suppository (25 mg total) rectally every 12 (twelve) hours as needed for refractory nausea / vomiting. (Patient not taking: Reported on 12/03/2017) 12 suppository 11  . senna-docusate (SENOKOT-S) 8.6-50 MG tablet Take  1-2 tablets by mouth 2 (two) times daily as needed for mild constipation or moderate constipation. 30 tablet 1  . sitaGLIPtin (JANUVIA) 100 MG tablet Take 1 tablet (100 mg total) by mouth daily. 90 tablet 3  . sorbitol 70 % solution Take 15 cc every 4 hours until bowel movement (Patient not taking: Reported on 10/02/2017) 473 mL 0   No current facility-administered medications for this visit.     SURGICAL HISTORY:  Past Surgical History:  Procedure Laterality Date  . CAROTID STENT Right ?2014  . COLONOSCOPY N/A 11/30/2015   Procedure: COLONOSCOPY;  Surgeon: Teena Irani, MD;  Location: [facility] ENDOSCOPY;  Service: Endoscopy;  Laterality: N/A;  . ESOPHAGOGASTRODUODENOSCOPY N/A 11/30/2015   Procedure: ESOPHAGOGASTRODUODENOSCOPY (EGD);  Surgeon: Teena Irani, MD;  Location: [facility] ENDOSCOPY;  Service: Endoscopy;  Laterality: N/A;  . ESOPHAGOGASTRODUODENOSCOPY (EGD) WITH PROPOFOL N/A 01/05/2016   Procedure: ESOPHAGOGASTRODUODENOSCOPY (EGD) WITH PROPOFOL;  Surgeon: Teena Irani, MD;  Location: [facility] ENDOSCOPY;  Service: Endoscopy;  Laterality: N/A;  . FEMORAL ARTERY STENT Left 05/2012; ~ 2015   Ar[facility] Endo 06/04/2012; Raechel Chute report  . FRACTURE SURGERY    . GIVENS CAPSULE STUDY N/A 12/22/2015   Procedure: GIVENS CAPSULE STUDY;  Surgeon: Wonda Horner, MD;  Location: [facility] ENDOSCOPY;  Service: Endoscopy;  Laterality: N/A;  . HARDWARE REMOVAL Right 11/15/2011   Removal of deep frontozygomatic orbital hardware/notes 11/15/2011  . HERNIA REPAIR  [id number]   Umbilical  . LAPAROSCOPY N/A 02/27/2016   Procedure: LAPAROSCOPY, LAPAROTOMY  WITH TWO SMALL BOWEL RESECTION;  Surgeon: Johnathan Hausen, MD;  Location: [facility] ORS;  Service: General;  Laterality: N/A;  . ORIF ORBITAL FRACTURE Right 08/15/2010    caught in a hydraulic machine; open reduction internal fixation of orbital rim fracture and open reduction of zygomatic arch fracture  Archie Endo 10/13/2009  . PORTACATH PLACEMENT Left 04/04/2016   Procedure: INSERTION PORT-A-CATH LEFT CHEST;   Surgeon: Melrose Nakayama, MD;  Location: [facility];  Service: Thoracic;  Laterality: Left;  Marland Kitchen VIDEO ASSISTED THORACOSCOPY (VATS)/ LOBECTOMY Right 10/18/2015   Procedure: VIDEO ASSISTED THORACOSCOPY (VATS)/ LOBECTOMY;  Surgeon: Melrose Nakayama, MD;  Location: [facility];  Service: Thoracic;  Laterality: Right;  Marland Kitchen VIDEO BRONCHOSCOPY Bilateral 09/21/2015   Procedure: VIDEO BRONCHOSCOPY WITH FLUORO;  Surgeon: Juanito Doom, MD;  Location: [facility] ENDOSCOPY;  Service: Cardiopulmonary;  Laterality: Bilateral;    REVIEW OF SYSTEMS:  A comprehensive review of systems was negative except for: Constitutional: positive for fatigue   PHYSICAL EXAMINATION: General appearance: alert, cooperative and no distress Head: Normocephalic, without obvious abnormality, atraumatic Neck: no adenopathy, no JVD, supple, symmetrical, trachea midline and thyroid not enlarged, symmetric, no tenderness/mass/nodules Lymph nodes: Cervical, supraclavicular, and axillary nodes normal. Resp: clear to auscultation bilaterally Back: symmetric, no curvature. ROM normal. No CVA tenderness. Cardio: regular rate and rhythm, S1, S2 normal, no murmur, click, rub or  gallop GI: soft, non-tender; bowel sounds normal; no masses,  no organomegaly Extremities: extremities normal, atraumatic, no cyanosis or edema  ECOG PERFORMANCE STATUS: 1 - Symptomatic but completely ambulatory  Blood pressure 102/74, pulse 92, temperature 98.7 F (37.1 C), temperature source Oral, resp. rate 18, height _0  (1.702 m), weight 115 lb 1.6 oz (52.2 kg), SpO2 99 %.  LABORATORY DATA: Lab Results  Component Value Date   WBC 6.5 01/15/2018   HGB 15.0 01/15/2018   HCT 46.4 01/15/2018   MCV 90.6 01/15/2018   PLT 208 01/15/2018      Chemistry      Component Value Date/Time   NA 140 01/15/2018 0943   NA 140 01/23/2017 0910   K 4.1 01/15/2018 0943   K 4.5 01/23/2017 0910   CL 107 01/15/2018 0943   CO2 26 01/15/2018 0943   CO2 28 01/23/2017 0910   BUN  6 01/15/2018 0943   BUN 8.1 01/23/2017 0910   CREATININE 0.80 01/15/2018 0943   CREATININE 0.8 01/23/2017 0910   GLU 398 (H) 08/08/2016 1257      Component Value Date/Time   CALCIUM 9.2 01/15/2018 0943   CALCIUM 9.6 01/23/2017 0910   ALKPHOS 101 01/15/2018 0943   ALKPHOS 113 01/23/2017 0910   AST 15 01/15/2018 0943   AST 11 01/23/2017 0910   ALT 13 01/15/2018 0943   ALT 12 01/23/2017 0910   BILITOT 0.4 01/15/2018 0943   BILITOT 0.40 01/23/2017 0910       RADIOGRAPHIC STUDIES: No results found.   ASSESSMENT AND PLAN:  This is a very pleasant 57 years old white male with metastatic non-small cell lung cancer, adenocarcinoma status post right upper lobectomy with lymph node dissection. Unfortunately the patient was found to have metastatic disease in the jejunum and terminal ileum.  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 The patient was started on treatment with systemic chemotherapy with carboplatin and Alimta for 2 cycles discontinued secondary to intolerance and disease progression. The patient is currently undergoing treatment with second line immunotherapy with Ketruda (pembrolizumab) 200 mg IV every 3 weeks, status post 28 cycles. He continues to tolerate this treatment well with no concerning adverse effects. I recommended for him to proceed with cycle #29 today as scheduled. I will see him back for follow-up visit in 3 weeks for evaluation before starting cycle #30. The patient was advised to call immediately if he has any concerning symptoms in the interval. The patient voices understanding of current disease status and treatment options and is in agreement with the current care plan. All questions were answered. The patient knows to call the clinic with any problems, questions or concerns. We can certainly see the patient much sooner if  necessary.  Disclaimer: This note was dictated with voice recognition software. Similar sounding words can inadvertently be transcribed and may not be corrected upon review.

## 2018-01-15 NOTE — Patient Instructions (Signed)
[facility] Discharge Instructions for Patients Receiving Chemotherapy  Today you received the following chemotherapy agents: pembrolizumab [name]).  To help prevent nausea and vomiting after your treatment, we encourage you to take your nausea medication as directed   If you develop nausea and vomiting that is not controlled by your nausea medication, call the clinic.   BELOW ARE SYMPTOMS THAT SHOULD BE REPORTED IMMEDIATELY:  *FEVER GREATER THAN 100.5 F  *CHILLS WITH OR WITHOUT FEVER  NAUSEA AND VOMITING THAT IS NOT CONTROLLED WITH YOUR NAUSEA MEDICATION  *UNUSUAL SHORTNESS OF BREATH  *UNUSUAL BRUISING OR BLEEDING  TENDERNESS IN MOUTH AND THROAT WITH OR WITHOUT PRESENCE OF ULCERS  *URINARY PROBLEMS  *BOWEL PROBLEMS  UNUSUAL RASH Items with * indicate a potential emergency and should be followed up as soon as possible.  Feel free to call the clinic should you have any questions or concerns. The clinic phone number is [phone].  Please show the [name] at check-in to the Emergency Department and triage nurse.

## 2018-02-05 ENCOUNTER — Inpatient Hospital Stay: Payer: Medicare HMO | Attending: Internal Medicine

## 2018-02-05 ENCOUNTER — Encounter: Payer: Self-pay | Admitting: Internal Medicine

## 2018-02-05 ENCOUNTER — Inpatient Hospital Stay: Payer: Medicare HMO

## 2018-02-05 ENCOUNTER — Telehealth: Payer: Self-pay | Admitting: Internal Medicine

## 2018-02-05 ENCOUNTER — Inpatient Hospital Stay (HOSPITAL_BASED_OUTPATIENT_CLINIC_OR_DEPARTMENT_OTHER): Payer: Medicare HMO | Admitting: Internal Medicine

## 2018-02-05 VITALS — BP 104/76 | HR 70 | Temp 98.1°F | Resp 18 | Ht 67.0 in | Wt 113.9 lb

## 2018-02-05 DIAGNOSIS — C3491 Malignant neoplasm of unspecified part of right bronchus or lung: Secondary | ICD-10-CM

## 2018-02-05 DIAGNOSIS — Z79899 Other long term (current) drug therapy: Secondary | ICD-10-CM | POA: Insufficient documentation

## 2018-02-05 DIAGNOSIS — C784 Secondary malignant neoplasm of small intestine: Secondary | ICD-10-CM | POA: Diagnosis not present

## 2018-02-05 DIAGNOSIS — Z5112 Encounter for antineoplastic immunotherapy: Secondary | ICD-10-CM | POA: Diagnosis not present

## 2018-02-05 DIAGNOSIS — C3411 Malignant neoplasm of upper lobe, right bronchus or lung: Secondary | ICD-10-CM

## 2018-02-05 DIAGNOSIS — C3490 Malignant neoplasm of unspecified part of unspecified bronchus or lung: Secondary | ICD-10-CM

## 2018-02-05 DIAGNOSIS — C171 Malignant neoplasm of jejunum: Secondary | ICD-10-CM

## 2018-02-05 LAB — CBC WITH DIFFERENTIAL (CANCER CENTER ONLY)
Abs Immature Granulocytes: 0.03 10*3/uL (ref 0.00–0.07)
Basophils Absolute: 0 10*3/uL (ref 0.0–0.1)
Basophils Relative: 1 %
Eosinophils Absolute: 0.2 10*3/uL (ref 0.0–0.5)
Eosinophils Relative: 3 %
HCT: 44.2 % (ref 39.0–52.0)
Hemoglobin: 14.3 g/dL (ref 13.0–17.0)
Immature Granulocytes: 1 %
Lymphocytes Relative: 20 %
Lymphs Abs: 1.2 10*3/uL (ref 0.7–4.0)
MCH: 29.5 pg (ref 26.0–34.0)
MCHC: 32.4 g/dL (ref 30.0–36.0)
MCV: 91.3 fL (ref 80.0–100.0)
Monocytes Absolute: 0.6 10*3/uL (ref 0.1–1.0)
Monocytes Relative: 10 %
Neutro Abs: 4.1 10*3/uL (ref 1.7–7.7)
Neutrophils Relative %: 65 %
Platelet Count: 231 10*3/uL (ref 150–400)
RBC: 4.84 MIL/uL (ref 4.22–5.81)
RDW: 13.6 % (ref 11.5–15.5)
WBC Count: 6.1 10*3/uL (ref 4.0–10.5)
nRBC: 0 % (ref 0.0–0.2)

## 2018-02-05 LAB — CMP (CANCER CENTER ONLY)
ALT: 16 U/L (ref 0–44)
AST: 14 U/L — ABNORMAL LOW (ref 15–41)
Albumin: 3.7 g/dL (ref 3.5–5.0)
Alkaline Phosphatase: 104 U/L (ref 38–126)
Anion gap: 8 (ref 5–15)
BUN: 4 mg/dL — ABNORMAL LOW (ref 6–20)
CO2: 26 mmol/L (ref 22–32)
Calcium: 9.1 mg/dL (ref 8.9–10.3)
Chloride: 105 mmol/L (ref 98–111)
Creatinine: 0.75 mg/dL (ref 0.61–1.24)
GFR, Est AFR Am: >60 mL/min (ref >60)
GFR, Estimated: >60 mL/min (ref >60)
Glucose, Bld: 335 mg/dL — ABNORMAL HIGH (ref 70–99)
Potassium: 4.2 mmol/L (ref 3.5–5.1)
Sodium: 139 mmol/L (ref 135–145)
Total Bilirubin: 0.3 mg/dL (ref 0.3–1.2)
Total Protein: 6.9 g/dL (ref 6.5–8.1)

## 2018-02-05 LAB — TSH: TSH: 2.676 u[IU]/mL (ref 0.320–4.118)

## 2018-02-05 MED ORDER — SODIUM CHLORIDE 0.9 % IV SOLN
Freq: Once | INTRAVENOUS | Status: AC
  Administered 2018-02-05: 13:00:00 via INTRAVENOUS
  Filled 2018-02-05: qty 250

## 2018-02-05 MED ORDER — SODIUM CHLORIDE 0.9 % IV SOLN
200.0000 mg | Freq: Once | INTRAVENOUS | Status: AC
  Administered 2018-02-05: 200 mg via INTRAVENOUS
  Filled 2018-02-05: qty 8

## 2018-02-05 MED ORDER — SODIUM CHLORIDE 0.9% FLUSH
10.0000 mL | INTRAVENOUS | Status: DC | PRN
  Administered 2018-02-05: 10 mL
  Filled 2018-02-05: qty 10

## 2018-02-05 MED ORDER — HEPARIN SOD (PORK) LOCK FLUSH 100 UNIT/ML IV SOLN
500.0000 [IU] | Freq: Once | INTRAVENOUS | Status: AC | PRN
  Administered 2018-02-05: 500 [IU]
  Filled 2018-02-05: qty 5

## 2018-02-05 NOTE — Patient Instructions (Signed)
Brett [name] Discharge Instructions for Patients Receiving Chemotherapy  Today you received the following chemotherapy agents: pembrolizumab [name]).  To help prevent nausea and vomiting after your treatment, we encourage you to take your nausea medication as directed   If you develop nausea and vomiting that is not controlled by your nausea medication, call the clinic.   BELOW ARE SYMPTOMS THAT SHOULD BE REPORTED IMMEDIATELY:  *FEVER GREATER THAN 100.5 F  *CHILLS WITH OR WITHOUT FEVER  NAUSEA AND VOMITING THAT IS NOT CONTROLLED WITH YOUR NAUSEA MEDICATION  *UNUSUAL SHORTNESS OF BREATH  *UNUSUAL BRUISING OR BLEEDING  TENDERNESS IN MOUTH AND THROAT WITH OR WITHOUT PRESENCE OF ULCERS  *URINARY PROBLEMS  *BOWEL PROBLEMS  UNUSUAL RASH Items with * indicate a potential emergency and should be followed up as soon as possible.  Feel free to call the clinic should you have any questions or concerns. The clinic phone number is [phone].  Please show the [facility] [place] at check-in to the Emergency Department and triage nurse.

## 2018-02-05 NOTE — Progress Notes (Signed)
Okay to treat with Keytruda today with hyperglycemia per Dr. Julien Nordmann.  Pt is asymptomatic, pt will continue to monitor at home.

## 2018-02-05 NOTE — Telephone Encounter (Signed)
Added additional cycles per 1/16 los - pt to get an updated schedule next visit.

## 2018-02-05 NOTE — Progress Notes (Signed)
[facility] [phone]   [phone]  OFFICE PROGRESS NOTE  [name], MD [address] [place] [address]  DIAGNOSIS: Stage IV (T1b, N0, M1b) non-small cell lung cancer, adenocarcinoma presented with right upper lobe lung nodule and recent metastasis to the small intestine. This was initially diagnosed in September 2017.  Genomic Alterations Identified? ERBB2 amplification - equivocal? CDKN2A p16INK4a E88* and p14ARF X646O SMARCA4 splice site [id number]>IB SPTA1 E2022* TOP2A amplification TP53 A159P Additional Findings? Microsatellite status MS-Stable Tumor Mutation Burden TMB-Intermediate; 18 Muts/Mb Additional Disease-relevant Genes with No Reportable Alterations Identified? EGFR KRAS ALK BRAF MET RET ROS1   PRIOR THERAPY:  1) Status post right VATS with right upper lobectomy and mediastinal lymph node dissection under the care of Dr. Roxan Hockey on 10/18/2015 and the final pathology was consistent with stage IA (T1b, N0, MX). 2) upper endoscopy on 01/05/2016 showed normal esophagus, normal stomach but there was occasional mass around 3.0 CM in length circumferential nonobstructing in the jejunum. The final pathology was consistent with metastatic adenocarcinoma. 3) status post laparoscopic laparotomy and resection of proximal lesion and and distal jejunum/proximal ileum under the care of Dr. Hassell Done 1 02/27/2016. 3)  Systemic chemotherapy with carboplatin for AUC of 5 and Alimta 500 MG/M2 every 3 weeks. First dose 04/04/2016. Status post 2 cycles. Last dose was given 04/21/2016 discontinued secondary to disease progression.   CURRENT THERAPY: Second line immunotherapy with Ketruda 200 mg IV every 2 weeks, first dose 05/30/2016. Status post 29 cycles.  INTERVAL HISTORY: Brett [name] 58 y.o. male returns to the clinic today for follow-up visit accompanied by his wife.  The patient is feeling fine today with no concerning  complaints except for intermittent upset stomach.  He denied having any recent nausea, vomiting, diarrhea or constipation.  He denied having any fever or chills.  He has no chest pain, shortness of breath, cough or hemoptysis.  He denied having any recent weight loss or night sweats.  He continues to tolerate his treatment with Keytruda fairly well.  The patient is here today for evaluation before starting cycle #30.   MEDICAL HISTORY: Past Medical History:  Diagnosis Date  . Barrett's esophagus 07/01/2013   Without dysplasia on biopsy 09/03/2012. Repeat EGD recommended 08/2015  . Bilateral cataracts 02/13/2017  . Carotid artery stenosis 07/01/2013   Requiring right sided stent   . Chronic pain syndrome 07/01/2013  . Closed head injury with brief loss of consciousness ([place]) 07/22/2010   Head trapped in a hydraulic device at work.  Fracture of orbital bones on right and brief loss of consciousness per report.  . 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 There was likely a strong psycho-social overlay in regard to the cognitive deficits in the form of mood disorder with psychotic features and mixed anxiety symptomatology. His primary tested cognitive deficits are in the areas of attention, executi  . Daily headache "since 07/2010"   constantly  . Degenerative joint disease of cervical spine 07/01/2013  . Dupuytren's contracture of both hands 04/08/2014  . Encounter for antineoplastic chemotherapy 10/05/2015  . Encounter for antineoplastic immunotherapy 05/24/2016  . Erectile dysfunction associated with type 2 diabetes mellitus ([place]) 07/01/2013  . Fibromyalgia 07/01/2013  . Goals of care, counseling/discussion 03/28/2016  . History of blood transfusion 11/28/2015   "suppose to get his first today" (11/28/2015)  . Hyperlipidemia  LDL goal < 100 07/01/2013  . Intractable  hiccups 04/11/2016  . 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 Memory changes    "memory issues" from head injury  . Moderate protein-calorie malnutrition ([place]) 11/29/2015  . Osteoarthritis of right thumb 10/21/2014  . Peripheral vascular occlusive disease ([place]) 07/01/2013   Requiring 2 arterial stents above the left knee per report  . Pneumonia ~ 2006/2007  . Post traumatic stress disorder 07/01/2013  . Primary lung adenocarcinoma ([place]) dx'd 08/2015   "right lung"  . Severe major depression with psychotic features ([place]) 04/15/2011  . Tobacco abuse 07/01/2013  . Type 2 diabetes mellitus with vascular disease ([place]) 07/01/2013   Left lower extremity and right carotid stenting    ALLERGIES:  is allergic to gabapentin; lyrica [pregabalin]; jardiance [empagliflozin]; celebrex [celecoxib]; and contrast media [iodinated diagnostic agents].  MEDICATIONS:  Current Outpatient Medications  Medication Sig Dispense Refill  . ACCU-CHEK SOFTCLIX LANCETS lancets Use to test blood glucose 1-2 times daily. Dx Code E11.59 100 each 12  . aspirin EC 81 MG tablet Take 81 mg by mouth daily.     Marland Kitchen atorvastatin (LIPITOR) 40 MG tablet Take 1 tablet (40 mg total) by mouth daily. 90 tablet 3  . Blood Glucose Monitoring Suppl (ACCU-CHEK AVIVA PLUS) w/Device KIT Use to test blood glucose 1-2 times daily. Dx Code E11.59 1 kit 0  . cyclobenzaprine (FLEXERIL) 10 MG tablet Take 1 tablet (10 mg total) by mouth 3 (three) times daily as needed for muscle spasms. 90 tablet 11  . diphenhydrAMINE (BENADRYL) 25 mg capsule Take 2 capsules (50 mg total) by mouth as directed. Take prior to CT scan as directed (Patient not taking: Reported on 12/03/2017) 30 capsule 0  . dronabinol (MARINOL) 2.5 MG capsule Take 1 capsule (2.5 mg total) by mouth 2 (two) times daily before a meal. 60 capsule 0  . glipiZIDE (GLUCOTROL) 10 MG tablet Take 1 tablet (10 mg total) by mouth 2 (two) times daily before a meal. 180 tablet 3  . glucose blood  (ACCU-CHEK AVIVA) test strip Use to test blood glucose 1-2 times daily. Dx Code E11.59 100 each 12  . hydrOXYzine (ATARAX/VISTARIL) 10 MG tablet Take 1 tablet (10 mg total) by mouth 3 (three) times daily as needed for itching. 30 tablet 0  . insulin aspart (NOVOLOG) 100 UNIT/ML FlexPen Inject 20 Units into the skin daily. Just before dinner 5 pen 3  . Insulin Glargine (LANTUS) 100 UNIT/ML Solostar Pen Inject 20 Units into the skin daily. 15 mL 3  . Insulin Pen Needle (PEN NEEDLES) 32G X 4 MM MISC 1 pen by Does not apply route 2 (two) times daily. Dx Code E11.59 200 each 6  . lidocaine-prilocaine (EMLA) cream Apply 1 application topically as needed. 30 g 0  . lisinopril (PRINIVIL,ZESTRIL) 5 MG tablet Take 0.5 tablets (2.5 mg total) by mouth daily. 90 tablet 3  . metFORMIN (GLUCOPHAGE-XR) 500 MG 24 hr tablet Take 1 tablet (500 mg total) by mouth 2 (two) times daily. 180 tablet 3  . metoCLOPramide (REGLAN) 10 MG tablet TAKE 1 TABLET BY MOUTH FOUR TIMES DAILY AS NEEDED AS DIRECTED (Patient not taking: Reported on 10/02/2017) 30 tablet 0  . ondansetron (ZOFRAN) 4 MG tablet Take 1 tablet (4 mg total) by mouth 3 (three) times daily. (Patient not taking: Reported on 12/03/2017) 20 tablet 0  . [START ON 03/11/2018] oxyCODONE-acetaminophen (PERCOCET) 10-325 MG tablet Take 1-2 tablets by mouth every 6 (six) hours as needed for pain. 240 tablet  0  . pantoprazole (PROTONIX) 40 MG tablet TAKE 1 TABLET(40 MG) BY MOUTH DAILY 90 tablet 1  . predniSONE (DELTASONE) 50 MG tablet TAKE 1 TABLET BY MOUTH AS DIRECTED. 1 TABLET 13 HOURS, 7 HOURS, AND 1 HOUR PRIOR TO SCAN (Patient not taking: Reported on 12/03/2017) 3 tablet 0  . prochlorperazine (COMPAZINE) 10 MG tablet Take 10 mg by mouth every 6 (six) hours as needed for nausea or vomiting.    . prochlorperazine (COMPAZINE) 25 MG suppository Place 1 suppository (25 mg total) rectally every 12 (twelve) hours as needed for refractory nausea / vomiting. (Patient not taking:  Reported on 12/03/2017) 12 suppository 11  . senna-docusate (SENOKOT-S) 8.6-50 MG tablet Take 1-2 tablets by mouth 2 (two) times daily as needed for mild constipation or moderate constipation. 30 tablet 1  . sitaGLIPtin (JANUVIA) 100 MG tablet Take 1 tablet (100 mg total) by mouth daily. 90 tablet 3  . sorbitol 70 % solution Take 15 cc every 4 hours until bowel movement (Patient not taking: Reported on 10/02/2017) 473 mL 0   No current facility-administered medications for this visit.     SURGICAL HISTORY:  Past Surgical History:  Procedure Laterality Date  . CAROTID STENT Right ?2014  . COLONOSCOPY N/A 11/30/2015   Procedure: COLONOSCOPY;  Surgeon: Teena Irani, MD;  Location: [facility] ENDOSCOPY;  Service: Endoscopy;  Laterality: N/A;  . ESOPHAGOGASTRODUODENOSCOPY N/A 11/30/2015   Procedure: ESOPHAGOGASTRODUODENOSCOPY (EGD);  Surgeon: Teena Irani, MD;  Location: [facility] ENDOSCOPY;  Service: Endoscopy;  Laterality: N/A;  . ESOPHAGOGASTRODUODENOSCOPY (EGD) WITH PROPOFOL N/A 01/05/2016   Procedure: ESOPHAGOGASTRODUODENOSCOPY (EGD) WITH PROPOFOL;  Surgeon: Teena Irani, MD;  Location: [facility] ENDOSCOPY;  Service: Endoscopy;  Laterality: N/A;  . FEMORAL ARTERY STENT Left 05/2012; ~ 2015   Archie Endo 06/04/2012; Raechel Chute report  . FRACTURE SURGERY    . GIVENS CAPSULE STUDY N/A 12/22/2015   Procedure: GIVENS CAPSULE STUDY;  Surgeon: Wonda Horner, MD;  Location: [facility] ENDOSCOPY;  Service: Endoscopy;  Laterality: N/A;  . HARDWARE REMOVAL Right 11/15/2011   Removal of deep frontozygomatic orbital hardware/notes 11/15/2011  . HERNIA REPAIR  [id number]   Umbilical  . LAPAROSCOPY N/A 02/27/2016   Procedure: LAPAROSCOPY, LAPAROTOMY  WITH TWO SMALL BOWEL RESECTION;  Surgeon: Johnathan Hausen, MD;  Location: [facility] ORS;  Service: General;  Laterality: N/A;  . ORIF ORBITAL FRACTURE Right 08/15/2010    caught in a hydraulic machine; open reduction internal fixation of orbital rim fracture and open reduction of zygomatic arch fracture  Archie Endo  10/13/2009  . PORTACATH PLACEMENT Left 04/04/2016   Procedure: INSERTION PORT-A-CATH LEFT CHEST;  Surgeon: Melrose Nakayama, MD;  Location: [facility];  Service: Thoracic;  Laterality: Left;  Marland Kitchen VIDEO ASSISTED THORACOSCOPY (VATS)/ LOBECTOMY Right 10/18/2015   Procedure: VIDEO ASSISTED THORACOSCOPY (VATS)/ LOBECTOMY;  Surgeon: Melrose Nakayama, MD;  Location: [facility];  Service: Thoracic;  Laterality: Right;  Marland Kitchen VIDEO BRONCHOSCOPY Bilateral 09/21/2015   Procedure: VIDEO BRONCHOSCOPY WITH FLUORO;  Surgeon: Juanito Doom, MD;  Location: [facility] ENDOSCOPY;  Service: Cardiopulmonary;  Laterality: Bilateral;    REVIEW OF SYSTEMS:  A comprehensive review of systems was negative except for: Constitutional: positive for fatigue   PHYSICAL EXAMINATION: General appearance: alert, cooperative and no distress Head: Normocephalic, without obvious abnormality, atraumatic Neck: no adenopathy, no JVD, supple, symmetrical, trachea midline and thyroid not enlarged, symmetric, no tenderness/mass/nodules Lymph nodes: Cervical, supraclavicular, and axillary nodes normal. Resp: clear to auscultation bilaterally Back: symmetric, no curvature. ROM normal. No CVA tenderness. Cardio: regular rate and rhythm,  S1, S2 normal, no murmur, click, rub or gallop GI: soft, non-tender; bowel sounds normal; no masses,  no organomegaly Extremities: extremities normal, atraumatic, no cyanosis or edema  ECOG PERFORMANCE STATUS: 1 - Symptomatic but completely ambulatory  Blood pressure 104/76, pulse 70, temperature 98.1 F (36.7 C), temperature source Oral, resp. rate 18, height 5' 7"  (1.702 m), weight 113 lb 14.4 oz (51.7 kg), SpO2 100 %.  LABORATORY DATA: Lab Results  Component Value Date   WBC 6.5 01/15/2018   HGB 15.0 01/15/2018   HCT 46.4 01/15/2018   MCV 90.6 01/15/2018   PLT 208 01/15/2018      Chemistry      Component Value Date/Time   NA 140 01/15/2018 0943   NA 140 01/23/2017 0910   K 4.1 01/15/2018 0943   K 4.5  01/23/2017 0910   CL 107 01/15/2018 0943   CO2 26 01/15/2018 0943   CO2 28 01/23/2017 0910   BUN 6 01/15/2018 0943   BUN 8.1 01/23/2017 0910   CREATININE 0.80 01/15/2018 0943   CREATININE 0.8 01/23/2017 0910   GLU 398 (H) 08/08/2016 1257      Component Value Date/Time   CALCIUM 9.2 01/15/2018 0943   CALCIUM 9.6 01/23/2017 0910   ALKPHOS 101 01/15/2018 0943   ALKPHOS 113 01/23/2017 0910   AST 15 01/15/2018 0943   AST 11 01/23/2017 0910   ALT 13 01/15/2018 0943   ALT 12 01/23/2017 0910   BILITOT 0.4 01/15/2018 0943   BILITOT 0.40 01/23/2017 0910       RADIOGRAPHIC STUDIES: No results found.   ASSESSMENT AND PLAN:  This is a very pleasant 58 years old white male with metastatic non-small cell lung cancer, adenocarcinoma status post right upper lobectomy with lymph node dissection. Unfortunately the patient was found to have metastatic disease in the jejunum and terminal ileum.  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 The patient was started on treatment with systemic chemotherapy with carboplatin and Alimta for 2 cycles discontinued secondary to intolerance and disease progression. The patient is currently undergoing treatment with second line immunotherapy with Ketruda (pembrolizumab) 200 mg IV every 3 weeks, status post 29 cycles. He has been tolerating this treatment well with no concerning adverse effects. I recommended for him to proceed with cycle #30 today. I will see him back for follow-up visit in 3 weeks for evaluation after repeating CT scan of the chest, abdomen and pelvis for restaging of his disease before starting cycle #31. The patient was advised to call immediately if he has any concerning symptoms in the interval. The patient voices understanding of current disease status and treatment options and is in agreement with the  current care plan. All questions were answered. The patient knows to call the clinic with any problems, questions or concerns. We can certainly see the patient much sooner if necessary.  Disclaimer: This note was dictated with voice recognition software. Similar sounding words can inadvertently be transcribed and may not be corrected upon review.

## 2018-02-10 ENCOUNTER — Other Ambulatory Visit: Payer: Self-pay | Admitting: Internal Medicine

## 2018-02-10 DIAGNOSIS — Z5112 Encounter for antineoplastic immunotherapy: Secondary | ICD-10-CM

## 2018-02-10 DIAGNOSIS — E1159 Type 2 diabetes mellitus with other circulatory complications: Secondary | ICD-10-CM

## 2018-02-10 DIAGNOSIS — C171 Malignant neoplasm of jejunum: Secondary | ICD-10-CM

## 2018-02-10 DIAGNOSIS — C3491 Malignant neoplasm of unspecified part of right bronchus or lung: Secondary | ICD-10-CM

## 2018-02-19 ENCOUNTER — Ambulatory Visit (HOSPITAL_COMMUNITY)
Admission: RE | Admit: 2018-02-19 | Discharge: 2018-02-19 | Disposition: A | Discharge status: Home / Self Care | Payer: Medicare HMO | Source: Ambulatory Visit | Attending: Internal Medicine | Admitting: Internal Medicine

## 2018-02-19 DIAGNOSIS — C3490 Malignant neoplasm of unspecified part of unspecified bronchus or lung: Secondary | ICD-10-CM | POA: Diagnosis not present

## 2018-02-19 DIAGNOSIS — C7880 Secondary malignant neoplasm of unspecified digestive organ: Secondary | ICD-10-CM | POA: Diagnosis not present

## 2018-02-19 DIAGNOSIS — C3411 Malignant neoplasm of upper lobe, right bronchus or lung: Secondary | ICD-10-CM | POA: Diagnosis not present

## 2018-02-24 ENCOUNTER — Other Ambulatory Visit: Payer: Self-pay | Admitting: *Deleted

## 2018-02-24 DIAGNOSIS — C3490 Malignant neoplasm of unspecified part of unspecified bronchus or lung: Secondary | ICD-10-CM

## 2018-02-26 ENCOUNTER — Inpatient Hospital Stay: Payer: Medicare HMO

## 2018-02-26 ENCOUNTER — Inpatient Hospital Stay: Payer: Medicare HMO | Admitting: Internal Medicine

## 2018-03-03 ENCOUNTER — Ambulatory Visit: Payer: Self-pay

## 2018-03-06 ENCOUNTER — Inpatient Hospital Stay: Payer: Medicare HMO

## 2018-03-06 ENCOUNTER — Inpatient Hospital Stay: Payer: Medicare HMO | Attending: Internal Medicine

## 2018-03-06 ENCOUNTER — Inpatient Hospital Stay (HOSPITAL_BASED_OUTPATIENT_CLINIC_OR_DEPARTMENT_OTHER): Payer: Medicare HMO | Admitting: Physician Assistant

## 2018-03-06 ENCOUNTER — Telehealth: Payer: Self-pay | Admitting: Physician Assistant

## 2018-03-06 VITALS — BP 110/66 | HR 74 | Temp 98.6°F | Resp 18 | Ht 67.0 in | Wt 117.9 lb

## 2018-03-06 DIAGNOSIS — C784 Secondary malignant neoplasm of small intestine: Secondary | ICD-10-CM | POA: Diagnosis not present

## 2018-03-06 DIAGNOSIS — C3411 Malignant neoplasm of upper lobe, right bronchus or lung: Secondary | ICD-10-CM | POA: Insufficient documentation

## 2018-03-06 DIAGNOSIS — E119 Type 2 diabetes mellitus without complications: Secondary | ICD-10-CM

## 2018-03-06 DIAGNOSIS — R197 Diarrhea, unspecified: Secondary | ICD-10-CM

## 2018-03-06 DIAGNOSIS — C3490 Malignant neoplasm of unspecified part of unspecified bronchus or lung: Secondary | ICD-10-CM

## 2018-03-06 DIAGNOSIS — Z5112 Encounter for antineoplastic immunotherapy: Secondary | ICD-10-CM

## 2018-03-06 DIAGNOSIS — C3491 Malignant neoplasm of unspecified part of right bronchus or lung: Secondary | ICD-10-CM

## 2018-03-06 LAB — CBC WITH DIFFERENTIAL (CANCER CENTER ONLY)
Abs Immature Granulocytes: 0.04 10*3/uL (ref 0.00–0.07)
Basophils Absolute: 0 10*3/uL (ref 0.0–0.1)
Basophils Relative: 1 %
Eosinophils Absolute: 0.2 10*3/uL (ref 0.0–0.5)
Eosinophils Relative: 3 %
HCT: 43.3 % (ref 39.0–52.0)
Hemoglobin: 14 g/dL (ref 13.0–17.0)
Immature Granulocytes: 1 %
Lymphocytes Relative: 18 %
Lymphs Abs: 1.2 10*3/uL (ref 0.7–4.0)
MCH: 29.7 pg (ref 26.0–34.0)
MCHC: 32.3 g/dL (ref 30.0–36.0)
MCV: 91.7 fL (ref 80.0–100.0)
Monocytes Absolute: 0.7 10*3/uL (ref 0.1–1.0)
Monocytes Relative: 10 %
Neutro Abs: 4.8 10*3/uL (ref 1.7–7.7)
Neutrophils Relative %: 67 %
Platelet Count: 237 10*3/uL (ref 150–400)
RBC: 4.72 MIL/uL (ref 4.22–5.81)
RDW: 13.5 % (ref 11.5–15.5)
WBC Count: 7 10*3/uL (ref 4.0–10.5)
nRBC: 0 % (ref 0.0–0.2)

## 2018-03-06 LAB — CMP (CANCER CENTER ONLY)
ALT: 32 U/L (ref 0–44)
AST: 38 U/L (ref 15–41)
Albumin: 3.3 g/dL — ABNORMAL LOW (ref 3.5–5.0)
Alkaline Phosphatase: 132 U/L — ABNORMAL HIGH (ref 38–126)
Anion gap: 8 (ref 5–15)
BUN: 7 mg/dL (ref 6–20)
CO2: 27 mmol/L (ref 22–32)
Calcium: 8.7 mg/dL — ABNORMAL LOW (ref 8.9–10.3)
Chloride: 108 mmol/L (ref 98–111)
Creatinine: 0.69 mg/dL (ref 0.61–1.24)
GFR, Est AFR Am: >60 mL/min (ref >60)
GFR, Estimated: >60 mL/min (ref >60)
Glucose, Bld: 94 mg/dL (ref 70–99)
Potassium: 3.8 mmol/L (ref 3.5–5.1)
Sodium: 143 mmol/L (ref 135–145)
Total Bilirubin: 0.3 mg/dL (ref 0.3–1.2)
Total Protein: 6.2 g/dL — ABNORMAL LOW (ref 6.5–8.1)

## 2018-03-06 MED ORDER — SODIUM CHLORIDE 0.9 % IV SOLN
200.0000 mg | Freq: Once | INTRAVENOUS | Status: AC
  Administered 2018-03-06: 200 mg via INTRAVENOUS
  Filled 2018-03-06: qty 8

## 2018-03-06 MED ORDER — HEPARIN SOD (PORK) LOCK FLUSH 100 UNIT/ML IV SOLN
500.0000 [IU] | Freq: Once | INTRAVENOUS | Status: AC | PRN
  Administered 2018-03-06: 500 [IU]
  Filled 2018-03-06: qty 5

## 2018-03-06 MED ORDER — SODIUM CHLORIDE 0.9% FLUSH
10.0000 mL | INTRAVENOUS | Status: DC | PRN
  Administered 2018-03-06: 10 mL
  Filled 2018-03-06: qty 10

## 2018-03-06 MED ORDER — SODIUM CHLORIDE 0.9 % IV SOLN
Freq: Once | INTRAVENOUS | Status: AC
  Administered 2018-03-06: 12:00:00 via INTRAVENOUS
  Filled 2018-03-06: qty 250

## 2018-03-06 NOTE — Telephone Encounter (Signed)
Called pt per 2/14 los - left message for patient with appt date and time

## 2018-03-06 NOTE — Progress Notes (Signed)
[facility] OFFICE PROGRESS NOTE  [name], MD [place] [address]  DIAGNOSIS: Stage IV (T1b, N0, M1b) non-small cell lung cancer, adenocarcinoma presented with right upper lobe lung nodule and metastasis to the small intestine. This was initially diagnosed in September 2017.  Genomic Alterations Identified? ERBB2 amplification - equivocal? CDKN2A p16INK4a E88* and p14ARF T245Y SMARCA4 splice site [id number]>LZ SPTA1 E2022* TOP2A amplification TP53 A159P Additional Findings? Microsatellite status MS-Stable Tumor Mutation Burden TMB-Intermediate; 18 Muts/Mb Additional Disease-relevant Genes with No Reportable Alterations Identified? EGFR KRAS ALK BRAF MET RET ROS1  PRIOR THERAPY:  1) Status post right VATS with right upper lobectomy and mediastinal lymph node dissection under the care of Dr. Roxan Hockey on 10/18/2015 and the final pathology was consistent with stage IA (T1b, N0, MX). 2) upper endoscopy on 01/05/2016 showed normal esophagus, normal stomach but there was occasional mass around 3.0 CM in length circumferential nonobstructing in the jejunum. The final pathology was consistent with metastatic adenocarcinoma. 3) status post laparoscopic laparotomy and resection of proximal lesion and and distal jejunum/proximal ileum under the care of Dr. Hassell Done 1 02/27/2016. 3)  Systemic chemotherapy with carboplatin for AUC of 5 and Alimta 500 MG/M2 every 3 weeks. First dose 04/04/2016. Status post 2 cycles. Last dose was given 04/21/2016 discontinued secondary to disease progression.  CURRENT THERAPY: Second line immunotherapy with Ketruda 200 mg IV every 3 weeks, first dose 05/30/2016. Status post 30 cycles.  INTERVAL HISTORY: Brett [name] 58 y.o. male returns to the clinic today for follow-up visit accompanied by his wife.  The patient is feeling well today without any concerning complaints except for some diarrhea. The patient states that  he has about 5-6 bouts of diarrhea daily.  Tried taking Imodium for a short while without relief. He denies any other associate symptoms such as weakness, nausea, vomiting, weight loss, abdominal pain, polyuria, or polydipsia, or hematochezia/melena. He denies any fever, chills, or night sweats. He denies any chest pain, cough, hemoptysis, or shortness of breath. He denies any headache or visual changes. The patient is here today for evaluation and to review his recent CT scan prior to starting cycle number 31 today.    MEDICAL HISTORY: Past Medical History:  Diagnosis Date  . Barrett's esophagus 07/01/2013   Without dysplasia on biopsy 09/03/2012. Repeat EGD recommended 08/2015  . Bilateral cataracts 02/13/2017  . Carotid artery stenosis 07/01/2013   Requiring right sided stent   . Chronic pain syndrome 07/01/2013  . Closed head injury with brief loss of consciousness ([place]) 07/22/2010   Head trapped in a hydraulic device at work.  Fracture of orbital bones on right and brief loss of consciousness per report.  . 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 There was likely a strong psycho-social overlay in regard to the cognitive deficits in the form of mood disorder with psychotic features and mixed anxiety symptomatology. His primary tested cognitive deficits are in the areas of attention, executi  . Daily headache "since 07/2010"   constantly  . Degenerative joint disease of cervical spine 07/01/2013  . Dupuytren's contracture of both hands 04/08/2014  . Encounter for antineoplastic chemotherapy 10/05/2015  . Encounter for antineoplastic immunotherapy 05/24/2016  . Erectile dysfunction associated with type 2 diabetes mellitus ([place]) 07/01/2013  . Fibromyalgia 07/01/2013  . Goals of care, counseling/discussion 03/28/2016  . History of blood transfusion 11/28/2015    "  suppose to get his first today" (11/28/2015)  . Hyperlipidemia LDL goal < 100 07/01/2013  . Intractable hiccups 04/11/2016  . 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 Memory changes    "memory issues" from head injury  . Moderate protein-calorie malnutrition ([place]) 11/29/2015  . Osteoarthritis of right thumb 10/21/2014  . Peripheral vascular occlusive disease ([place]) 07/01/2013   Requiring 2 arterial stents above the left knee per report  . Pneumonia ~ 2006/2007  . Post traumatic stress disorder 07/01/2013  . Primary lung adenocarcinoma ([place]) dx'd 08/2015   "right lung"  . Severe major depression with psychotic features ([place]) 04/15/2011  . Tobacco abuse 07/01/2013  . Type 2 diabetes mellitus with vascular disease ([place]) 07/01/2013   Left lower extremity and right carotid stenting    ALLERGIES:  is allergic to gabapentin; lyrica [pregabalin]; jardiance [empagliflozin]; celebrex [celecoxib]; and contrast media [iodinated diagnostic agents].  MEDICATIONS:  Current Outpatient Medications  Medication Sig Dispense Refill  . ACCU-CHEK SOFTCLIX LANCETS lancets Use to test blood glucose 1-2 times daily. Dx Code E11.59 100 each 12  . aspirin EC 81 MG tablet Take 81 mg by mouth daily.     Marland Kitchen atorvastatin (LIPITOR) 40 MG tablet Take 1 tablet (40 mg total) by mouth daily. 90 tablet 3  . Blood Glucose Monitoring Suppl (ACCU-CHEK AVIVA PLUS) w/Device KIT Use to test blood glucose 1-2 times daily. Dx Code E11.59 1 kit 0  . cyclobenzaprine (FLEXERIL) 10 MG tablet Take 1 tablet (10 mg total) by mouth 3 (three) times daily as needed for muscle spasms. 90 tablet 11  . dronabinol (MARINOL) 2.5 MG capsule Take 1 capsule (2.5 mg total) by mouth 2 (two) times daily before a meal. 60 capsule 0  . glipiZIDE (GLUCOTROL) 10 MG tablet Take 1 tablet (10 mg total) by mouth 2 (two) times daily before a meal. 180 tablet 3  . glucose blood (ACCU-CHEK AVIVA) test strip Use to test blood glucose 1-2 times daily. Dx Code E11.59 100  each 12  . hydrOXYzine (ATARAX/VISTARIL) 10 MG tablet Take 1 tablet (10 mg total) by mouth 3 (three) times daily as needed for itching. 30 tablet 0  . insulin aspart (NOVOLOG) 100 UNIT/ML FlexPen Inject 20 Units into the skin daily. Just before dinner 5 pen 3  . Insulin Glargine (LANTUS) 100 UNIT/ML Solostar Pen Inject 20 Units into the skin daily. 15 mL 3  . Insulin Pen Needle (PEN NEEDLES) 32G X 4 MM MISC 1 pen by Does not apply route 2 (two) times daily. Dx Code E11.59 200 each 6  . lidocaine-prilocaine (EMLA) cream Apply 1 application topically as needed. 30 g 0  . lisinopril (PRINIVIL,ZESTRIL) 5 MG tablet Take 0.5 tablets (2.5 mg total) by mouth daily. 90 tablet 3  . metFORMIN (GLUCOPHAGE-XR) 500 MG 24 hr tablet Take 1 tablet (500 mg total) by mouth 2 (two) times daily. 180 tablet 3  . [START ON 03/11/2018] oxyCODONE-acetaminophen (PERCOCET) 10-325 MG tablet Take 1-2 tablets by mouth every 6 (six) hours as needed for pain. 240 tablet 0  . pantoprazole (PROTONIX) 40 MG tablet TAKE 1 TABLET(40 MG) BY MOUTH DAILY 90 tablet 1  . senna-docusate (SENOKOT-S) 8.6-50 MG tablet Take 1-2 tablets by mouth 2 (two) times daily as needed for mild constipation or moderate constipation. 30 tablet 1  . sitaGLIPtin (JANUVIA) 100 MG tablet Take 1 tablet (100 mg total) by mouth daily. 90 tablet 3  . diphenhydrAMINE (BENADRYL) 25 mg capsule Take 2 capsules (50 mg total) by mouth as  directed. Take prior to CT scan as directed (Patient not taking: Reported on 12/03/2017) 30 capsule 0  . metoCLOPramide (REGLAN) 10 MG tablet TAKE 1 TABLET BY MOUTH FOUR TIMES DAILY AS NEEDED AS DIRECTED (Patient not taking: Reported on 10/02/2017) 30 tablet 0  . ondansetron (ZOFRAN) 4 MG tablet Take 1 tablet (4 mg total) by mouth 3 (three) times daily. (Patient not taking: Reported on 12/03/2017) 20 tablet 0  . predniSONE (DELTASONE) 50 MG tablet TAKE 1 TABLET BY MOUTH AT 13 HOURS, 7 HOURS, AND 1 HOUR PRIOR TO SCAN (Patient not taking:  Reported on 03/06/2018) 3 tablet 0  . prochlorperazine (COMPAZINE) 10 MG tablet Take 10 mg by mouth every 6 (six) hours as needed for nausea or vomiting.    . prochlorperazine (COMPAZINE) 25 MG suppository Place 1 suppository (25 mg total) rectally every 12 (twelve) hours as needed for refractory nausea / vomiting. (Patient not taking: Reported on 12/03/2017) 12 suppository 11  . sorbitol 70 % solution Take 15 cc every 4 hours until bowel movement (Patient not taking: Reported on 10/02/2017) 473 mL 0   No current facility-administered medications for this visit.     SURGICAL HISTORY:  Past Surgical History:  Procedure Laterality Date  . CAROTID STENT Right ?2014  . COLONOSCOPY N/A 11/30/2015   Procedure: COLONOSCOPY;  Surgeon: Teena Irani, MD;  Location: [facility] ENDOSCOPY;  Service: Endoscopy;  Laterality: N/A;  . ESOPHAGOGASTRODUODENOSCOPY N/A 11/30/2015   Procedure: ESOPHAGOGASTRODUODENOSCOPY (EGD);  Surgeon: Teena Irani, MD;  Location: [facility] ENDOSCOPY;  Service: Endoscopy;  Laterality: N/A;  . ESOPHAGOGASTRODUODENOSCOPY (EGD) WITH PROPOFOL N/A 01/05/2016   Procedure: ESOPHAGOGASTRODUODENOSCOPY (EGD) WITH PROPOFOL;  Surgeon: Teena Irani, MD;  Location: [facility] ENDOSCOPY;  Service: Endoscopy;  Laterality: N/A;  . FEMORAL ARTERY STENT Left 05/2012; ~ 2015   Ar[facility] Endo 06/04/2012; Raechel Chute report  . FRACTURE SURGERY    . GIVENS CAPSULE STUDY N/A 12/22/2015   Procedure: GIVENS CAPSULE STUDY;  Surgeon: Wonda Horner, MD;  [facility];  Service: Endoscopy;  Laterality: N/A;  . HARDWARE REMOVAL Right 11/15/2011   Removal of deep frontozygomatic orbital hardware/notes 11/15/2011  . HERNIA REPAIR  4128   Umbilical  . LAPAROSCOPY N/A 02/27/2016   Procedure: LAPAROSCOPY, LAPAROTOMY  WITH TWO SMALL BOWEL RESECTION;  Surgeon: Johnathan Hausen, MD;  Location: [facility] ORS;  Service: General;  Laterality: N/A;  . ORIF ORBITAL FRACTURE Right 08/15/2010    caught in a hydraulic machine; open reduction internal fixation of  orbital rim fracture and open reduction of zygomatic arch fracture  Ar[facility] Endo 10/13/2009  . PORTACATH PLACEMENT Left 04/04/2016   Procedure: INSERTION PORT-A-CATH LEFT CHEST;  Surgeon: Melrose Nakayama, MD;  Location: [facility];  Service: Thoracic;  Laterality: Left;  Marland Kitchen VIDEO ASSISTED THORACOSCOPY (VATS)/ LOBECTOMY Right 10/18/2015   Procedure: VIDEO ASSISTED THORACOSCOPY (VATS)/ LOBECTOMY;  Surgeon: Melrose Nakayama, MD;  Location: [facility];  Service: Thoracic;  Laterality: Right;  Marland Kitchen VIDEO BRONCHOSCOPY Bilateral 09/21/2015   Procedure: VIDEO BRONCHOSCOPY WITH FLUORO;  Surgeon: Juanito Doom, MD;  Location: [facility] ENDOSCOPY;  Service: Cardiopulmonary;  Laterality: Bilateral;    REVIEW OF SYSTEMS:   Review of Systems  Constitutional: Negative for appetite change, chills, fatigue, fever and unexpected weight change.  HENT:   Negative for mouth sores, nosebleeds, sore throat and trouble swallowing.   Eyes: Negative for eye problems and icterus.  Respiratory: Negative for cough, hemoptysis, shortness of breath and wheezing.   Cardiovascular: Negative for chest pain and leg swelling.  Gastrointestinal: Positive for diarrhea. Negative  for abdominal pain, constipation, nausea and vomiting.  Genitourinary: Negative for bladder incontinence, difficulty urinating, dysuria, frequency and hematuria.   Musculoskeletal: Negative for back pain, gait problem, neck pain and neck stiffness.  Skin: Negative for itching and rash.  Neurological: Negative for dizziness, extremity weakness, gait problem, headaches, light-headedness and seizures.  Hematological: Negative for adenopathy. Does not bruise/bleed easily.  Psychiatric/Behavioral: Negative for confusion, depression and sleep disturbance. The patient is not nervous/anxious.     PHYSICAL EXAMINATION:  Blood pressure 110/66, pulse 74, temperature 98.6 F (37 C), temperature source Oral, resp. rate 18, height 5' 7"  (1.702 m), weight 117 lb 14.4 oz (53.5 kg), SpO2  100 %.  ECOG PERFORMANCE STATUS: 1 - Symptomatic but completely ambulatory  Physical Exam  Constitutional: Oriented to person, place, and time and well-developed, well-nourished, and in no distress. No distress.  HENT:  Head: Normocephalic and atraumatic.  Mouth/Throat: Oropharynx is clear and moist. No oropharyngeal exudate.  Eyes: Conjunctivae are normal. Right eye exhibits no discharge. Left eye exhibits no discharge. No scleral icterus.  Neck: Normal range of motion. Neck supple.  Cardiovascular: Normal rate, regular rhythm, normal heart sounds and intact distal pulses.   Pulmonary/Chest: Effort normal and breath sounds normal. No respiratory distress. No wheezes. No rales.  Abdominal: Soft. Bowel sounds are normal. Exhibits no distension and no mass. There is no tenderness.  Musculoskeletal: Normal range of motion. Exhibits no edema.  Lymphadenopathy:    No cervical adenopathy.  Neurological: Alert and oriented to person, place, and time. Exhibits normal muscle tone. Gait normal. Coordination normal.  Skin: Skin is warm and dry. No rash noted. Not diaphoretic. No erythema. No pallor.  Psychiatric: Mood, memory and judgment normal.  Vitals reviewed.  LABORATORY DATA: Lab Results  Component Value Date   WBC 7.0 03/06/2018   HGB 14.0 03/06/2018   HCT 43.3 03/06/2018   MCV 91.7 03/06/2018   PLT 237 03/06/2018      Chemistry      Component Value Date/Time   NA 143 03/06/2018 0916   NA 140 01/23/2017 0910   K 3.8 03/06/2018 0916   K 4.5 01/23/2017 0910   CL 108 03/06/2018 0916   CO2 27 03/06/2018 0916   CO2 28 01/23/2017 0910   BUN 7 03/06/2018 0916   BUN 8.1 01/23/2017 0910   CREATININE 0.69 03/06/2018 0916   CREATININE 0.8 01/23/2017 0910   GLU 398 (H) 08/08/2016 1257      Component Value Date/Time   CALCIUM 8.7 (L) 03/06/2018 0916   CALCIUM 9.6 01/23/2017 0910   ALKPHOS 132 (H) 03/06/2018 0916   ALKPHOS 113 01/23/2017 0910   AST 38 03/06/2018 0916   AST 11  01/23/2017 0910   ALT 32 03/06/2018 0916   ALT 12 01/23/2017 0910   BILITOT 0.3 03/06/2018 0916   BILITOT 0.40 01/23/2017 0910       RADIOGRAPHIC STUDIES:  Ct Abdomen Pelvis Wo Contrast  Result Date: 02/19/2018 CLINICAL DATA:  Non-small cell right upper lobe lung cancer with metastatic involvement of the small intestine. Restaging assessment and assessment of response to therapy. EXAM: CT CHEST, ABDOMEN AND PELVIS WITHOUT CONTRAST TECHNIQUE: Multidetector CT imaging of the chest, abdomen and pelvis was performed following the standard protocol without IV contrast. COMPARISON:  12/02/2017 FINDINGS: CT CHEST FINDINGS Cardiovascular: Left Port-A-Cath tip: SVC. Coronary, aortic arch, and branch vessel atherosclerotic vascular disease. Mediastinum/Nodes: There is contrast in the distal esophagus which could be from reflux or dysmotility. Lungs/Pleura: Right upper lobectomy. Stable scarring along  the major fissure. The indistinct ground-glass densities inferiorly in the right middle lobe are less dense than they were before, indicating some improvement. Indistinct ground-glass density in the superior segment left lower lobe shown on the prior exam has resolved. The nodular scarring laterally at the left lung base has improved. There are a couple of adjacent 2-3 mm nodules in the left lower lobe which are likely postinflammatory. Musculoskeletal: Unremarkable CT ABDOMEN PELVIS FINDINGS Hepatobiliary: 1.1 cm gallstone within the somewhat contracted gallbladder. Otherwise unremarkable. Pancreas: Unremarkable Spleen: Unremarkable Adrenals/Urinary Tract: Unremarkable Stomach/Bowel: Prominent stool throughout the colon favors constipation. Staple line along the right-sided small bowel without local mass observed. There is a nearby mesenteric lymph node measuring 1.3 cm in short axis on image 77/2, previously by my measurement 1.0 cm. Vascular/Lymphatic: Aortoiliac atherosclerotic vascular disease. A 1.0 cm right  external iliac node is stable in size and retains its fatty hilum. A similar low-density 1.0 cm left external iliac lymph node has a fatty hilum and is stable. Reproductive: Calcifications in the right side of the prostate gland, unchanged. Other: No supplemental non-categorized findings. Musculoskeletal: Unremarkable IMPRESSION: 1. The only finding of specific concern for residual malignancy is a 1.3 cm mesenteric lymph nodes slightly eccentric to the right side on image 77/2. 2. Reduced densities of the right middle lobe ground-glass opacities shown on the prior exam, with improvement likely indicating a non neoplastic nature. 3. Cholelithiasis. 4.  Prominent stool throughout the colon favors constipation. 5.  Aortic Atherosclerosis (ICD10-I70.0). Electronically Signed   By: Van Clines M.D.   On: 02/19/2018 15:17   Ct Chest Wo Contrast  Result Date: 02/19/2018 CLINICAL DATA:  Non-small cell right upper lobe lung cancer with metastatic involvement of the small intestine. Restaging assessment and assessment of response to therapy. EXAM: CT CHEST, ABDOMEN AND PELVIS WITHOUT CONTRAST TECHNIQUE: Multidetector CT imaging of the chest, abdomen and pelvis was performed following the standard protocol without IV contrast. COMPARISON:  12/02/2017 FINDINGS: CT CHEST FINDINGS Cardiovascular: Left Port-A-Cath tip: SVC. Coronary, aortic arch, and branch vessel atherosclerotic vascular disease. Mediastinum/Nodes: There is contrast in the distal esophagus which could be from reflux or dysmotility. Lungs/Pleura: Right upper lobectomy. Stable scarring along the major fissure. The indistinct ground-glass densities inferiorly in the right middle lobe are less dense than they were before, indicating some improvement. Indistinct ground-glass density in the superior segment left lower lobe shown on the prior exam has resolved. The nodular scarring laterally at the left lung base has improved. There are a couple of adjacent  2-3 mm nodules in the left lower lobe which are likely postinflammatory. Musculoskeletal: Unremarkable CT ABDOMEN PELVIS FINDINGS Hepatobiliary: 1.1 cm gallstone within the somewhat contracted gallbladder. Otherwise unremarkable. Pancreas: Unremarkable Spleen: Unremarkable Adrenals/Urinary Tract: Unremarkable Stomach/Bowel: Prominent stool throughout the colon favors constipation. Staple line along the right-sided small bowel without local mass observed. There is a nearby mesenteric lymph node measuring 1.3 cm in short axis on image 77/2, previously by my measurement 1.0 cm. Vascular/Lymphatic: Aortoiliac atherosclerotic vascular disease. A 1.0 cm right external iliac node is stable in size and retains its fatty hilum. A similar low-density 1.0 cm left external iliac lymph node has a fatty hilum and is stable. Reproductive: Calcifications in the right side of the prostate gland, unchanged. Other: No supplemental non-categorized findings. Musculoskeletal: Unremarkable IMPRESSION: 1. The only finding of specific concern for residual malignancy is a 1.3 cm mesenteric lymph nodes slightly eccentric to the right side on image 77/2. 2. Reduced densities of the right  middle lobe ground-glass opacities shown on the prior exam, with improvement likely indicating a non neoplastic nature. 3. Cholelithiasis. 4.  Prominent stool throughout the colon favors constipation. 5.  Aortic Atherosclerosis (ICD10-I70.0). Electronically Signed   By: Van Clines M.D.   On: 02/19/2018 15:17     ASSESSMENT/PLAN:  This is a very pleasant 58 year old Caucasian male with metastatic non-small cell lung cancer, adenocarcinoma status post right upper lobectomy with lymph node dissection. Unfortunately the patient was found to have metastatic disease in the jejunum and terminal ileum.  He underwent surgical resection of the proximal and distal jejunum as well as the proximal ileum and the final pathology was consistent with high-grade  neuroendocrine carcinoma. The patient was started on treatment with systemic chemotherapy with carboplatin and Alimta for 2 cycles discontinued secondary to intolerance and disease progression. The patient is currently undergoing treatment with second line immunotherapy with Ketruda (pembrolizumab) 200 mg IV every 3 weeks, status post 30 cycles.  Patient was seen today with Dr. Julien Nordmann.  The patient continues to tolerate treatment well with no adverse effects except for some diarrhea.  The patient was advised to continue to take Imodium more consistently until symptoms resolve.   Restaging CT chest, abdomen, and pelvis was performed.  The images were personally and independently reviewed by Dr. Julien Nordmann and did not demonstrate any evidence of disease progression. The CT scan results were discussed with the patient and his wife. We recommend for the patient to proceed with cycle #31 today as scheduled.   We will see the patient back for follow-up visit in 3 weeks prior to starting cycle #32.  The patient was advised to call immediately if he has any concerning symptoms in the interval. The patient voices understanding of current disease status and treatment options and is in agreement with the current care plan. All questions were answered. The patient knows to call the clinic with any problems, questions or concerns. We can certainly see the patient much sooner if necessary     Orders Placed This Encounter  Procedures  . TSH    Standing Status:   Standing    Number of Occurrences:   6    Standing Expiration Date:   03/07/2019     Brett Sos Airam Runions, PA-C 03/06/18  ADDENDUM: Hematology/Oncology Attending: I had a face-to-face encounter with the patient.  I recommended his care plan.  This is a very pleasant 58 years old white male with metastatic non-small cell lung cancer.  The patient is currently undergoing treatment with immunotherapy with Keytruda status post 13 cycles.  He has  been tolerating this treatment well with no concerning adverse effect except for intermittent diarrhea.  He does not take his antidiarrheal medication as prescribed. The patient had repeat CT scan of the chest, abdomen and pelvis performed recently.  I personally and independently reviewed the scans and discussed the results with the patient and his wife. His a scan showed no concerning findings for disease progression. I recommended for the patient to continue his current treatment with [facility] and he will proceed with cycle #31 today. For the diabetes mellitus, its better controlled these days and he will continue to follow-up with Dr. Eppie Gibson. I will see the patient back for follow-up visit in 3 weeks for evaluation before the next cycle of his treatment. The patient was advised to call immediately if he has any concerning symptoms in the interval.  Disclaimer: This note was dictated with voice recognition software. Similar sounding  words can inadvertently be transcribed and may be missed upon review. Eilleen Kempf, MD 03/07/18

## 2018-03-06 NOTE — Patient Instructions (Signed)
[place] [facility] Discharge Instructions for Patients Receiving Chemotherapy  Today you received the following chemotherapy agents: pembrolizumab [name]).  To help prevent nausea and vomiting after your treatment, we encourage you to take your nausea medication as directed   If you develop nausea and vomiting that is not controlled by your nausea medication, call the clinic.   BELOW ARE SYMPTOMS THAT SHOULD BE REPORTED IMMEDIATELY:  *FEVER GREATER THAN 100.5 F  *CHILLS WITH OR WITHOUT FEVER  NAUSEA AND VOMITING THAT IS NOT CONTROLLED WITH YOUR NAUSEA MEDICATION  *UNUSUAL SHORTNESS OF BREATH  *UNUSUAL BRUISING OR BLEEDING  TENDERNESS IN MOUTH AND THROAT WITH OR WITHOUT PRESENCE OF ULCERS  *URINARY PROBLEMS  *BOWEL PROBLEMS  UNUSUAL RASH Items with * indicate a potential emergency and should be followed up as soon as possible.  Feel free to call the clinic should you have any questions or concerns. The clinic phone number is [phone].  Please show the [facility] at check-in to the Emergency Department and triage nurse.

## 2018-03-07 ENCOUNTER — Encounter: Payer: Self-pay | Admitting: Physician Assistant

## 2018-03-19 ENCOUNTER — Other Ambulatory Visit: Payer: Self-pay

## 2018-03-19 ENCOUNTER — Ambulatory Visit: Payer: Self-pay | Admitting: Internal Medicine

## 2018-03-19 ENCOUNTER — Ambulatory Visit: Payer: Self-pay

## 2018-03-20 IMAGING — CT CT ABD-PELV W/ CM
2 of 5 series · 15 of 46 positions shown, 17 images · IV contrast (APPLIED)
Comparison: 04/03/2016.

CLINICAL DATA: Right lung cancer status post resection, with
metastasis small bowel status post small bowel resection,
chemotherapy ongoing. Abdominal pain, constipation, nausea/ vomiting
x 2-3 days.

EXAM:
CT ABDOMEN AND PELVIS WITH CONTRAST
TECHNIQUE: Multidetector CT imaging of the abdomen and pelvis was performed
using the standard protocol following bolus administration of
intravenous contrast.
CONTRAST:  100mL HLEH5V-HKK IOPAMIDOL (HLEH5V-HKK) INJECTION 61%

[Series 2: axial st · axial · 0.69mm/px · z∈[-472,-52]mm · 12 of 100 slices shown, 14 images]
[im 8/100  soft-tissue]
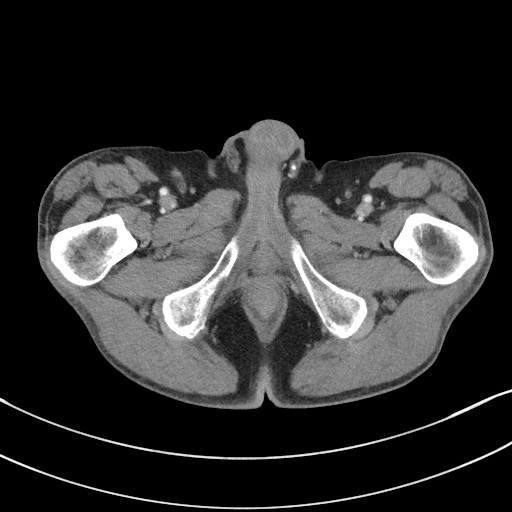
[im 8/100  bone]
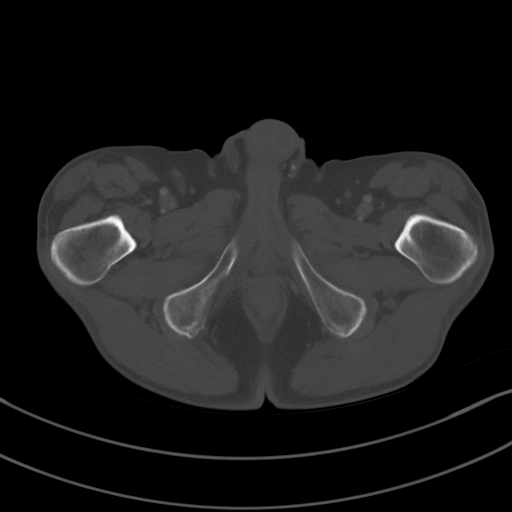
[im 15/100  soft-tissue]
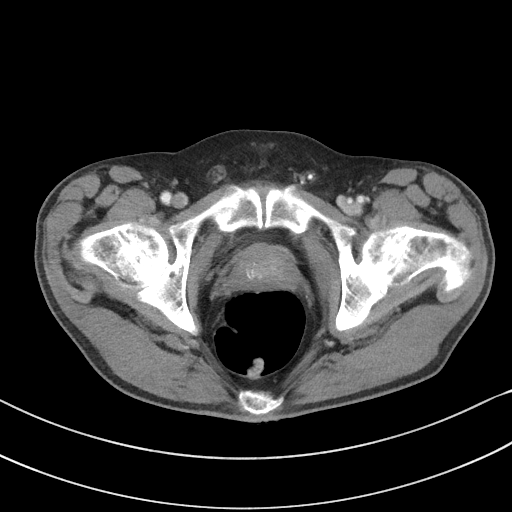
[im 22/100  soft-tissue]
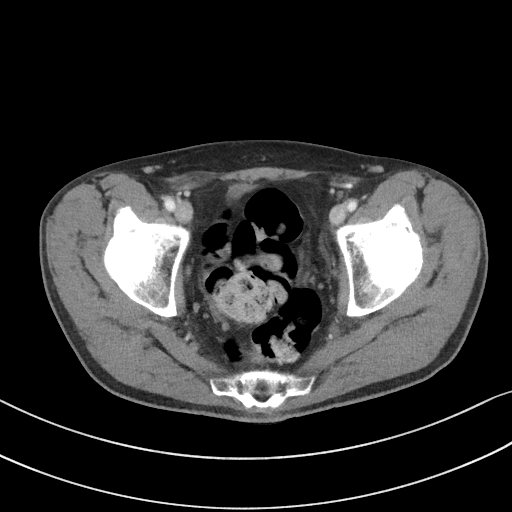
[im 29/100  soft-tissue]
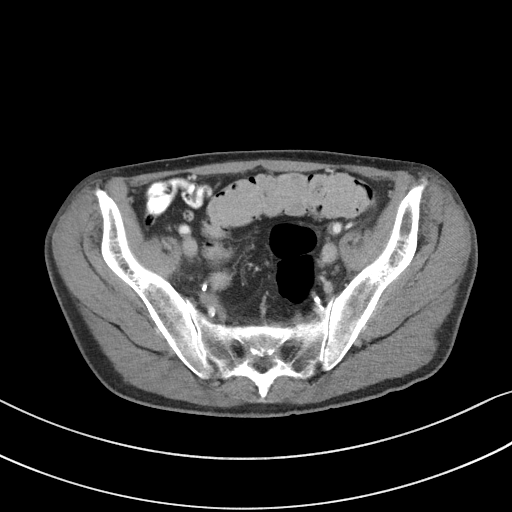
[im 36/100  soft-tissue]
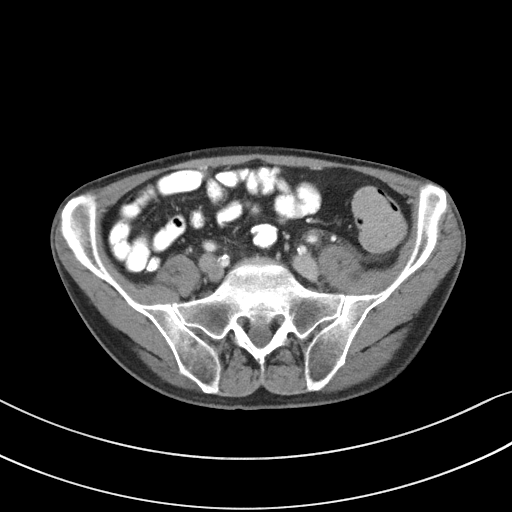
[im 43/100  soft-tissue]
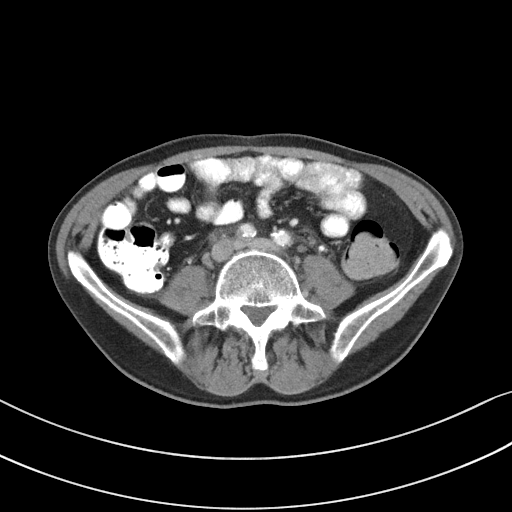
[im 57/100  soft-tissue]
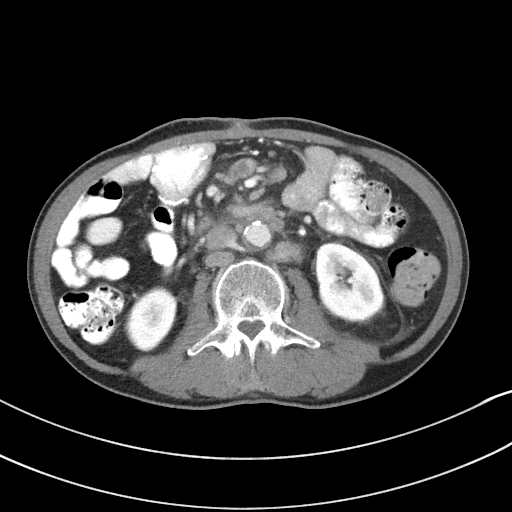
[im 64/100  soft-tissue]
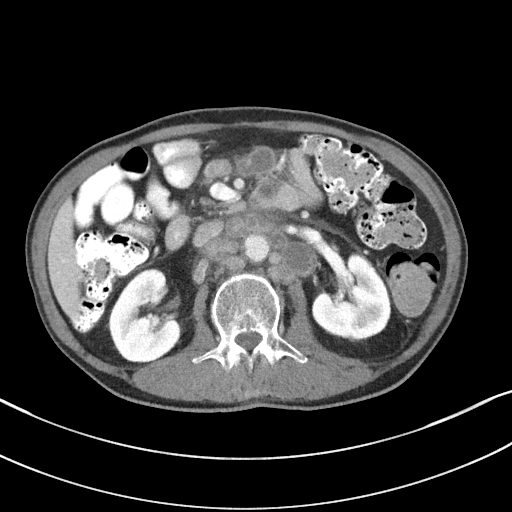
[im 71/100  soft-tissue]
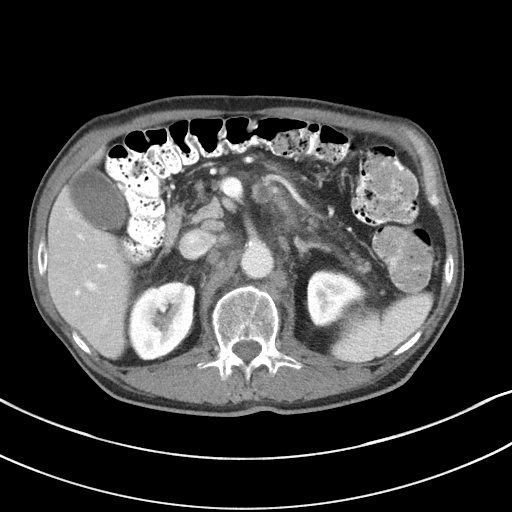
[im 71/100  bone]
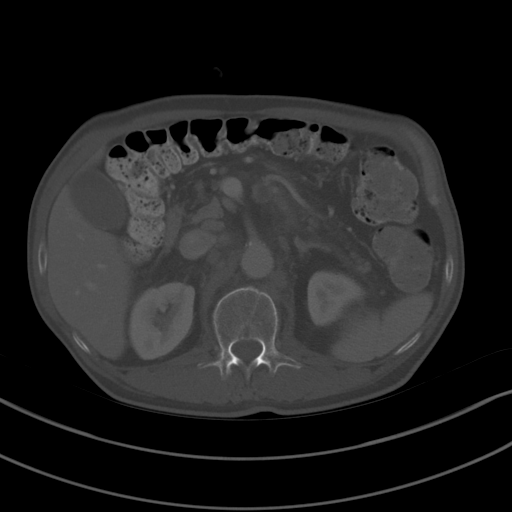
[im 78/100  soft-tissue]
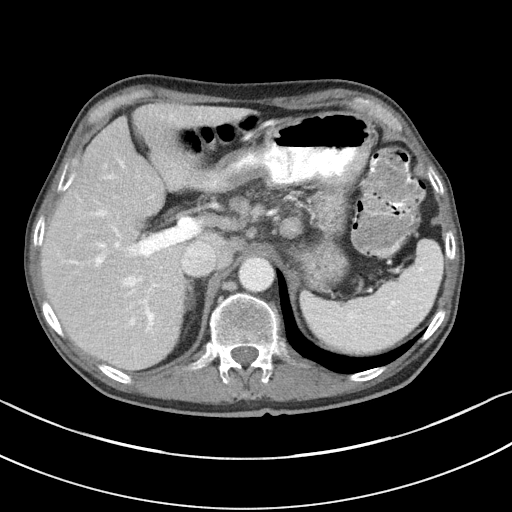
[im 85/100  soft-tissue]
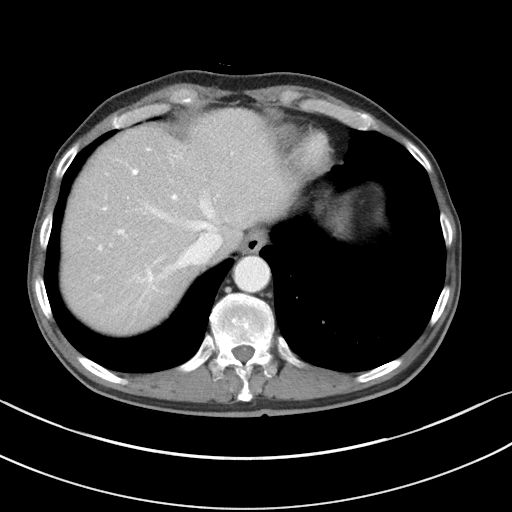
[im 92/100  soft-tissue]
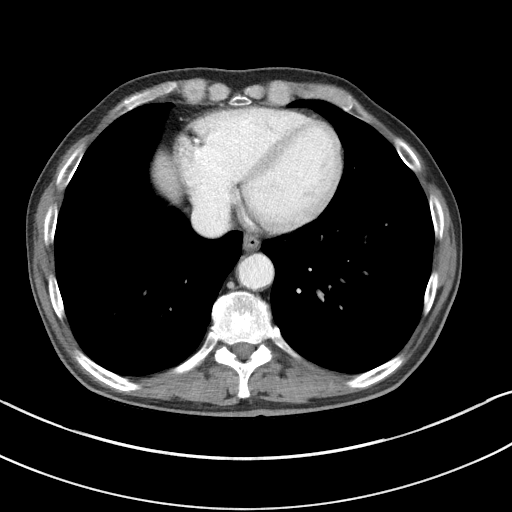

[Series 4: coronal st · coronal · 0.67mm/px · 3 of 82 slices shown]
[im 28/82  soft-tissue]
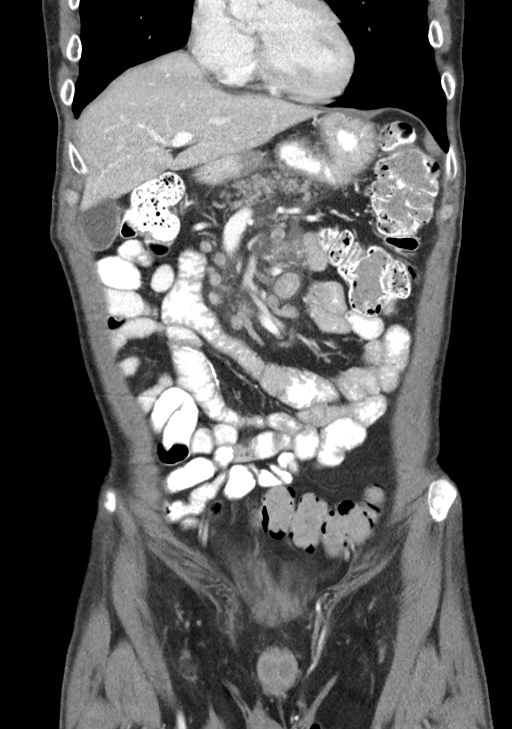
[im 37/82  soft-tissue]
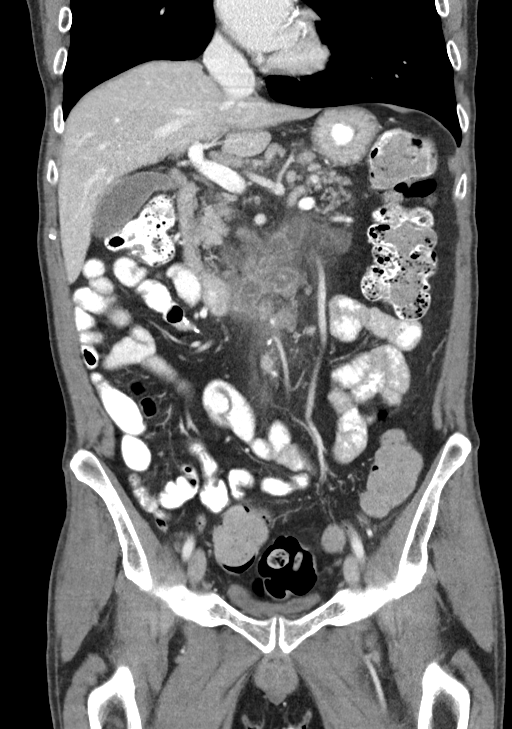
[im 46/82  soft-tissue]
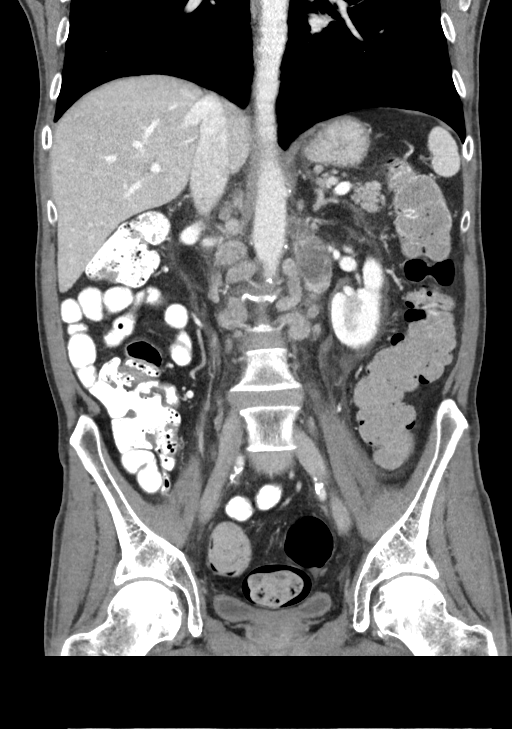

[15 of 46 positions shown; findings below may reference images not displayed]

FINDINGS: Lower chest: Lung bases are clear.

Hepatobiliary: Liver is within normal limits. No
suspicious/enhancing hepatic lesions.

Gallbladder is unremarkable. No intrahepatic or extrahepatic ductal
dilatation.

Pancreas: Within normal limits.

Spleen: Within normal limits.

Adrenals/Urinary Tract: Adrenal glands are within normal limits.

Kidneys are within normal limits.  No hydronephrosis.

Bladder is within normal limits.

Stomach/Bowel: Stomach is within normal limits.

No evidence of bowel obstruction.

Normal appendix (series 2/image 34).

Moderate left colonic stool burden.

Vascular/Lymphatic: No evidence of abdominal aortic aneurysm.

Atherosclerotic calcifications of the abdominal aorta and branch
vessels.

Progressive upper abdominal/retroperitoneal nodal metastases,
including:

--13 mm short axis gastrohepatic node (series 2/ image 23),
previously 6 mm

--12 mm short axis portacaval node (series 2/ image 26), previously
8 mm

--18 mm short axis necrotic left celiac axis node (series 2/ image
33), previously 13 mm

--23 mm short axis necrotic left para-aortic node (series 2/ image
36), previously 13 mm

--jejunal mesentery nodes measuring up to 17 mm short axis (series
2/ image 37), necrotic, previously 14 mm

Reproductive: Prostate is grossly unremarkable.

Other: No abdominopelvic ascites.

Possible 16 mm soft tissue implant along the midline anterior
abdominal wall (series 2/ image 41), equivocal.

Musculoskeletal: Degenerative changes of the visualized
thoracolumbar spine.
IMPRESSION: Progression of upper abdominal/ retroperitoneal nodal metastases, as
above.

Possible 16 mm soft tissue implant along the midline anterior
abdominal wall.

## 2018-03-26 ENCOUNTER — Telehealth: Payer: Self-pay | Admitting: Internal Medicine

## 2018-03-26 ENCOUNTER — Encounter: Payer: Self-pay | Admitting: Internal Medicine

## 2018-03-26 ENCOUNTER — Inpatient Hospital Stay (HOSPITAL_BASED_OUTPATIENT_CLINIC_OR_DEPARTMENT_OTHER): Payer: Medicare HMO | Admitting: Internal Medicine

## 2018-03-26 ENCOUNTER — Inpatient Hospital Stay: Payer: Medicare HMO | Attending: Internal Medicine

## 2018-03-26 ENCOUNTER — Inpatient Hospital Stay: Payer: Medicare HMO

## 2018-03-26 VITALS — BP 117/79 | HR 92 | Temp 98.0°F | Resp 18 | Ht 67.0 in | Wt 112.3 lb

## 2018-03-26 DIAGNOSIS — Z79899 Other long term (current) drug therapy: Secondary | ICD-10-CM | POA: Diagnosis not present

## 2018-03-26 DIAGNOSIS — C784 Secondary malignant neoplasm of small intestine: Secondary | ICD-10-CM | POA: Insufficient documentation

## 2018-03-26 DIAGNOSIS — E119 Type 2 diabetes mellitus without complications: Secondary | ICD-10-CM | POA: Insufficient documentation

## 2018-03-26 DIAGNOSIS — Z5112 Encounter for antineoplastic immunotherapy: Secondary | ICD-10-CM | POA: Diagnosis not present

## 2018-03-26 DIAGNOSIS — C3411 Malignant neoplasm of upper lobe, right bronchus or lung: Secondary | ICD-10-CM | POA: Insufficient documentation

## 2018-03-26 DIAGNOSIS — C3491 Malignant neoplasm of unspecified part of right bronchus or lung: Secondary | ICD-10-CM

## 2018-03-26 DIAGNOSIS — C3490 Malignant neoplasm of unspecified part of unspecified bronchus or lung: Secondary | ICD-10-CM

## 2018-03-26 LAB — COMPREHENSIVE METABOLIC PANEL
ALT: 27 U/L (ref 0–44)
AST: 31 U/L (ref 15–41)
Albumin: 3.7 g/dL (ref 3.5–5.0)
Alkaline Phosphatase: 117 U/L (ref 38–126)
Anion gap: 8 (ref 5–15)
BUN: 6 mg/dL (ref 6–20)
CO2: 26 mmol/L (ref 22–32)
Calcium: 8.7 mg/dL — ABNORMAL LOW (ref 8.9–10.3)
Chloride: 103 mmol/L (ref 98–111)
Creatinine, Ser: 0.59 mg/dL — ABNORMAL LOW (ref 0.61–1.24)
GFR calc Af Amer: >60 mL/min (ref >60)
GFR calc non Af Amer: >60 mL/min (ref >60)
Glucose, Bld: 302 mg/dL — ABNORMAL HIGH (ref 70–99)
Potassium: 3.6 mmol/L (ref 3.5–5.1)
Sodium: 137 mmol/L (ref 135–145)
Total Bilirubin: 0.4 mg/dL (ref 0.3–1.2)
Total Protein: 6.6 g/dL (ref 6.5–8.1)

## 2018-03-26 LAB — CBC WITH DIFFERENTIAL (CANCER CENTER ONLY)
Abs Immature Granulocytes: 0.04 10*3/uL (ref 0.00–0.07)
Basophils Absolute: 0 10*3/uL (ref 0.0–0.1)
Basophils Relative: 1 %
Eosinophils Absolute: 0.1 10*3/uL (ref 0.0–0.5)
Eosinophils Relative: 1 %
HCT: 44 % (ref 39.0–52.0)
Hemoglobin: 14.5 g/dL (ref 13.0–17.0)
Immature Granulocytes: 1 %
Lymphocytes Relative: 17 %
Lymphs Abs: 1.3 10*3/uL (ref 0.7–4.0)
MCH: 30 pg (ref 26.0–34.0)
MCHC: 33 g/dL (ref 30.0–36.0)
MCV: 90.9 fL (ref 80.0–100.0)
Monocytes Absolute: 0.6 10*3/uL (ref 0.1–1.0)
Monocytes Relative: 7 %
Neutro Abs: 5.7 10*3/uL (ref 1.7–7.7)
Neutrophils Relative %: 73 %
Platelet Count: 215 10*3/uL (ref 150–400)
RBC: 4.84 MIL/uL (ref 4.22–5.81)
RDW: 13.2 % (ref 11.5–15.5)
WBC Count: 7.7 10*3/uL (ref 4.0–10.5)
nRBC: 0 % (ref 0.0–0.2)

## 2018-03-26 LAB — TSH: TSH: 4.382 u[IU]/mL — ABNORMAL HIGH (ref 0.320–4.118)

## 2018-03-26 MED ORDER — HEPARIN SOD (PORK) LOCK FLUSH 100 UNIT/ML IV SOLN
500.0000 [IU] | Freq: Once | INTRAVENOUS | Status: AC | PRN
  Administered 2018-03-26: 500 [IU]
  Filled 2018-03-26: qty 5

## 2018-03-26 MED ORDER — SODIUM CHLORIDE 0.9 % IV SOLN
200.0000 mg | Freq: Once | INTRAVENOUS | Status: AC
  Administered 2018-03-26: 200 mg via INTRAVENOUS
  Filled 2018-03-26: qty 8

## 2018-03-26 MED ORDER — SODIUM CHLORIDE 0.9% FLUSH
10.0000 mL | INTRAVENOUS | Status: DC | PRN
  Administered 2018-03-26: 10 mL
  Filled 2018-03-26: qty 10

## 2018-03-26 MED ORDER — SODIUM CHLORIDE 0.9 % IV SOLN
Freq: Once | INTRAVENOUS | Status: AC
  Administered 2018-03-26: 13:00:00 via INTRAVENOUS
  Filled 2018-03-26: qty 250

## 2018-03-26 NOTE — Progress Notes (Signed)
[place] Telephone:(336) [phone]   [phone]) [phone]  OFFICE PROGRESS NOTE  [name], MD [address] [place] [address]  DIAGNOSIS: Stage IV (T1b, N0, M1b) non-small cell lung cancer, adenocarcinoma presented with right upper lobe lung nodule and recent metastasis to the small intestine. This was initially diagnosed in September 2017.  Genomic Alterations Identified? ERBB2 amplification - equivocal? CDKN2A p16INK4a E88* and p14ARF J825K SMARCA4 splice site [id number]>XT SPTA1 E2022* TOP2A amplification TP53 A159P Additional Findings? Microsatellite status MS-Stable Tumor Mutation Burden TMB-Intermediate; 18 Muts/Mb Additional Disease-relevant Genes with No Reportable Alterations Identified? EGFR KRAS ALK BRAF MET RET ROS1   PRIOR THERAPY:  1) Status post right VATS with right upper lobectomy and mediastinal lymph node dissection under the care of Dr. Roxan Hockey on 10/18/2015 and the final pathology was consistent with stage IA (T1b, N0, MX). 2) upper endoscopy on 01/05/2016 showed normal esophagus, normal stomach but there was occasional mass around 3.0 CM in length circumferential nonobstructing in the jejunum. The final pathology was consistent with metastatic adenocarcinoma. 3) status post laparoscopic laparotomy and resection of proximal lesion and and distal jejunum/proximal ileum under the care of Dr. Hassell Done 1 02/27/2016. 3)  Systemic chemotherapy with carboplatin for AUC of 5 and Alimta 500 MG/M2 every 3 weeks. First dose 04/04/2016. Status post 2 cycles. Last dose was given 04/21/2016 discontinued secondary to disease progression.   CURRENT THERAPY: Second line immunotherapy with Ketruda 200 mg IV every 2 weeks, first dose 05/30/2016. Status post 31 cycles.  INTERVAL HISTORY: Brett [name] 58 y.o. male returns to the clinic today for follow-up visit before his treatment.  The patient has no complaints today.  He was out of his  glucometer strips.  He was not checking his blood sugar as recommended.  He denied having any current chest pain, shortness of breath, cough or hemoptysis.  He denied having any fever or chills.  He has no nausea, vomiting, diarrhea or constipation.  He has no headache or visual changes.  He continues to tolerate his treatment with Keytruda fairly well.  The patient is here today for evaluation before starting cycle #32.   MEDICAL HISTORY: Past Medical History:  Diagnosis Date  . Barrett's esophagus 07/01/2013   Without dysplasia on biopsy 09/03/2012. Repeat EGD recommended 08/2015  . Bilateral cataracts 02/13/2017  . Carotid artery stenosis 07/01/2013   Requiring right sided stent   . Chronic pain syndrome 07/01/2013  . Closed head injury with brief loss of consciousness ([place]) 07/22/2010   Head trapped in a hydraulic device at work.  Fracture of orbital bones on right and brief loss of consciousness per report.  . 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 There was likely a strong psycho-social overlay in regard to the cognitive deficits in the form of mood disorder with psychotic features and mixed anxiety symptomatology. His primary tested cognitive deficits are in the areas of attention, executi  . Daily headache "since 07/2010"   constantly  . Degenerative joint disease of cervical spine 07/01/2013  . Dupuytren's contracture of both hands 04/08/2014  . Encounter for antineoplastic chemotherapy 10/05/2015  . Encounter for antineoplastic immunotherapy 05/24/2016  . Erectile dysfunction associated with type 2 diabetes mellitus ([place]) 07/01/2013  . Fibromyalgia 07/01/2013  . Goals of care, counseling/discussion 03/28/2016  . History of blood transfusion 11/28/2015   "suppose to get his first  today" (11/28/2015)  . Hyperlipidemia LDL goal < 100 07/01/2013  .  Intractable hiccups 04/11/2016  . 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 Memory changes    "memory issues" from head injury  . Moderate protein-calorie malnutrition ([place]) 11/29/2015  . Osteoarthritis of right thumb 10/21/2014  . Peripheral vascular occlusive disease ([place]) 07/01/2013   Requiring 2 arterial stents above the left knee per report  . Pneumonia ~ 2006/2007  . Post traumatic stress disorder 07/01/2013  . Primary lung adenocarcinoma ([place]) dx'd 08/2015   "right lung"  . Severe major depression with psychotic features ([place]) 04/15/2011  . Tobacco abuse 07/01/2013  . Type 2 diabetes mellitus with vascular disease ([place]) 07/01/2013   Left lower extremity and right carotid stenting    ALLERGIES:  is allergic to gabapentin; lyrica [pregabalin]; jardiance [empagliflozin]; celebrex [celecoxib]; and contrast media [iodinated diagnostic agents].  MEDICATIONS:  Current Outpatient Medications  Medication Sig Dispense Refill  . ACCU-CHEK SOFTCLIX LANCETS lancets Use to test blood glucose 1-2 times daily. Dx Code E11.59 100 each 12  . aspirin EC 81 MG tablet Take 81 mg by mouth daily.     Marland Kitchen atorvastatin (LIPITOR) 40 MG tablet Take 1 tablet (40 mg total) by mouth daily. 90 tablet 3  . Blood Glucose Monitoring Suppl (ACCU-CHEK AVIVA PLUS) w/Device KIT Use to test blood glucose 1-2 times daily. Dx Code E11.59 1 kit 0  . cyclobenzaprine (FLEXERIL) 10 MG tablet Take 1 tablet (10 mg total) by mouth 3 (three) times daily as needed for muscle spasms. 90 tablet 11  . diphenhydrAMINE (BENADRYL) 25 mg capsule Take 2 capsules (50 mg total) by mouth as directed. Take prior to CT scan as directed (Patient not taking: Reported on 12/03/2017) 30 capsule 0  . dronabinol (MARINOL) 2.5 MG capsule Take 1 capsule (2.5 mg total) by mouth 2 (two) times daily before a meal. 60 capsule 0  . glipiZIDE (GLUCOTROL) 10 MG tablet Take 1 tablet (10 mg total) by mouth 2 (two) times daily before a meal. 180 tablet 3  . glucose  blood (ACCU-CHEK AVIVA) test strip Use to test blood glucose 1-2 times daily. Dx Code E11.59 100 each 12  . hydrOXYzine (ATARAX/VISTARIL) 10 MG tablet Take 1 tablet (10 mg total) by mouth 3 (three) times daily as needed for itching. 30 tablet 0  . insulin aspart (NOVOLOG) 100 UNIT/ML FlexPen Inject 20 Units into the skin daily. Just before dinner 5 pen 3  . Insulin Glargine (LANTUS) 100 UNIT/ML Solostar Pen Inject 20 Units into the skin daily. 15 mL 3  . Insulin Pen Needle (PEN NEEDLES) 32G X 4 MM MISC 1 pen by Does not apply route 2 (two) times daily. Dx Code E11.59 200 each 6  . lidocaine-prilocaine (EMLA) cream Apply 1 application topically as needed. 30 g 0  . lisinopril (PRINIVIL,ZESTRIL) 5 MG tablet Take 0.5 tablets (2.5 mg total) by mouth daily. 90 tablet 3  . metFORMIN (GLUCOPHAGE-XR) 500 MG 24 hr tablet Take 1 tablet (500 mg total) by mouth 2 (two) times daily. 180 tablet 3  . metoCLOPramide (REGLAN) 10 MG tablet TAKE 1 TABLET BY MOUTH FOUR TIMES DAILY AS NEEDED AS DIRECTED (Patient not taking: Reported on 10/02/2017) 30 tablet 0  . ondansetron (ZOFRAN) 4 MG tablet Take 1 tablet (4 mg total) by mouth 3 (three) times daily. (Patient not taking: Reported on 12/03/2017) 20 tablet 0  . oxyCODONE-acetaminophen (PERCOCET) 10-325 MG tablet Take 1-2 tablets by mouth every 6 (six) hours as needed for pain.  240 tablet 0  . pantoprazole (PROTONIX) 40 MG tablet TAKE 1 TABLET(40 MG) BY MOUTH DAILY 90 tablet 1  . predniSONE (DELTASONE) 50 MG tablet TAKE 1 TABLET BY MOUTH AT 13 HOURS, 7 HOURS, AND 1 HOUR PRIOR TO SCAN (Patient not taking: Reported on 03/06/2018) 3 tablet 0  . prochlorperazine (COMPAZINE) 10 MG tablet Take 10 mg by mouth every 6 (six) hours as needed for nausea or vomiting.    . prochlorperazine (COMPAZINE) 25 MG suppository Place 1 suppository (25 mg total) rectally every 12 (twelve) hours as needed for refractory nausea / vomiting. (Patient not taking: Reported on 12/03/2017) 12 suppository  11  . senna-docusate (SENOKOT-S) 8.6-50 MG tablet Take 1-2 tablets by mouth 2 (two) times daily as needed for mild constipation or moderate constipation. 30 tablet 1  . sitaGLIPtin (JANUVIA) 100 MG tablet Take 1 tablet (100 mg total) by mouth daily. 90 tablet 3  . sorbitol 70 % solution Take 15 cc every 4 hours until bowel movement (Patient not taking: Reported on 10/02/2017) 473 mL 0   No current facility-administered medications for this visit.     SURGICAL HISTORY:  Past Surgical History:  Procedure Laterality Date  . CAROTID STENT Right ?2014  . COLONOSCOPY N/A 11/30/2015   Procedure: COLONOSCOPY;  Surgeon: Teena Irani, MD;  [facility] ENDOSCOPY;  Service: Endoscopy;  Laterality: N/A;  . ESOPHAGOGASTRODUODENOSCOPY N/A 11/30/2015   Procedure: ESOPHAGOGASTRODUODENOSCOPY (EGD);  Surgeon: Teena Irani, MD;  Location: [facility] ENDOSCOPY;  Service: Endoscopy;  Laterality: N/A;  . ESOPHAGOGASTRODUODENOSCOPY (EGD) WITH PROPOFOL N/A 01/05/2016   Procedure: ESOPHAGOGASTRODUODENOSCOPY (EGD) WITH PROPOFOL;  Surgeon: Teena Irani, MD;  Location: [facility] ENDOSCOPY;  Service: Endoscopy;  Laterality: N/A;  . FEMORAL ARTERY STENT Left 05/2012; ~ 2015   Archie Endo 06/04/2012; Raechel Chute report  . FRACTURE SURGERY    . GIVENS CAPSULE STUDY N/A 12/22/2015   Procedure: GIVENS CAPSULE STUDY;  Surgeon: Wonda Horner, MD;  Location: [facility] ENDOSCOPY;  Service: Endoscopy;  Laterality: N/A;  . HARDWARE REMOVAL Right 11/15/2011   Removal of deep frontozygomatic orbital hardware/notes 11/15/2011  . HERNIA REPAIR  [id number]   Umbilical  . LAPAROSCOPY N/A 02/27/2016   Procedure: LAPAROSCOPY, LAPAROTOMY  WITH TWO SMALL BOWEL RESECTION;  Surgeon: Johnathan Hausen, MD;  Location: [facility] ORS;  Service: General;  Laterality: N/A;  . ORIF ORBITAL FRACTURE Right 08/15/2010    caught in a hydraulic machine; open reduction internal fixation of orbital rim fracture and open reduction of zygomatic arch fracture  Archie Endo 10/13/2009  . PORTACATH PLACEMENT Left  04/04/2016   Procedure: INSERTION PORT-A-CATH LEFT CHEST;  Surgeon: Melrose Nakayama, MD;  Location: [place];  Service: Thoracic;  Laterality: Left;  Marland Kitchen VIDEO ASSISTED THORACOSCOPY (VATS)/ LOBECTOMY Right 10/18/2015   Procedure: VIDEO ASSISTED THORACOSCOPY (VATS)/ LOBECTOMY;  Surgeon: Melrose Nakayama, MD;  Location: [place];  Service: Thoracic;  Laterality: Right;  Marland Kitchen VIDEO BRONCHOSCOPY Bilateral 09/21/2015   Procedure: VIDEO BRONCHOSCOPY WITH FLUORO;  Surgeon: Juanito Doom, MD;  Location: [facility] ENDOSCOPY;  Service: Cardiopulmonary;  Laterality: Bilateral;    REVIEW OF SYSTEMS:  A comprehensive review of systems was negative.   PHYSICAL EXAMINATION: General appearance: alert, cooperative and no distress Head: Normocephalic, without obvious abnormality, atraumatic Neck: no adenopathy, no JVD, supple, symmetrical, trachea midline and thyroid not enlarged, symmetric, no tenderness/mass/nodules Lymph nodes: Cervical, supraclavicular, and axillary nodes normal. Resp: clear to auscultation bilaterally Back: symmetric, no curvature. ROM normal. No CVA tenderness. Cardio: regular rate and rhythm, S1, S2 normal, no murmur, click, rub  or gallop GI: soft, non-tender; bowel sounds normal; no masses,  no organomegaly Extremities: extremities normal, atraumatic, no cyanosis or edema  ECOG PERFORMANCE STATUS: 1 - Symptomatic but completely ambulatory  Blood pressure 117/79, pulse 92, temperature 98 F (36.7 C), temperature source Oral, resp. rate 18, height _0  (1.702 m), weight 112 lb 4.8 oz (50.9 kg), SpO2 100 %.  LABORATORY DATA: Lab Results  Component Value Date   WBC 7.7 03/26/2018   HGB 14.5 03/26/2018   HCT 44.0 03/26/2018   MCV 90.9 03/26/2018   PLT 215 03/26/2018      Chemistry      Component Value Date/Time   NA 143 03/06/2018 0916   NA 140 01/23/2017 0910   K 3.8 03/06/2018 0916   K 4.5 01/23/2017 0910   CL 108 03/06/2018 0916   CO2 27 03/06/2018 0916   CO2 28 01/23/2017  0910   BUN 7 03/06/2018 0916   BUN 8.1 01/23/2017 0910   CREATININE 0.69 03/06/2018 0916   CREATININE 0.8 01/23/2017 0910   GLU 398 (H) 08/08/2016 1257      Component Value Date/Time   CALCIUM 8.7 (L) 03/06/2018 0916   CALCIUM 9.6 01/23/2017 0910   ALKPHOS 132 (H) 03/06/2018 0916   ALKPHOS 113 01/23/2017 0910   AST 38 03/06/2018 0916   AST 11 01/23/2017 0910   ALT 32 03/06/2018 0916   ALT 12 01/23/2017 0910   BILITOT 0.3 03/06/2018 0916   BILITOT 0.40 01/23/2017 0910       RADIOGRAPHIC STUDIES: No results found.   ASSESSMENT AND PLAN:  This is a very pleasant 58 years old white male with metastatic non-small cell lung cancer, adenocarcinoma status post right upper lobectomy with lymph node dissection. Unfortunately the patient was found to have metastatic disease in the jejunum and terminal ileum.  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 The patient was started on treatment with systemic chemotherapy with carboplatin and Alimta for 2 cycles discontinued secondary to intolerance and disease progression. The patient is currently undergoing treatment with second line immunotherapy with Ketruda (pembrolizumab) 200 mg IV every 3 weeks, status post 31 cycles. He continues to tolerate this treatment well. I recommended for him to proceed with cycle #3 2 today. I will see him back for follow-up visit in 3 weeks for evaluation before the next cycle of his treatment. For the diabetes mellitus, he will continue his routine follow-up visit and evaluation by Dr. Eppie Gibson. He was advised to call immediately if he has any concerning symptoms in the interval. The patient voices understanding of current disease status and treatment options and is in agreement with the current care plan. All questions were answered. The patient knows to call the clinic with any  problems, questions or concerns. We can certainly see the patient much sooner if necessary.  Disclaimer: This note was dictated with voice recognition software. Similar sounding words can inadvertently be transcribed and may not be corrected upon review.

## 2018-03-26 NOTE — Patient Instructions (Signed)
[facility] Discharge Instructions for Patients Receiving Chemotherapy  Today you received the following chemotherapy agents: pembrolizumab [name]).  To help prevent nausea and vomiting after your treatment, we encourage you to take your nausea medication as directed   If you develop nausea and vomiting that is not controlled by your nausea medication, call the clinic.   BELOW ARE SYMPTOMS THAT SHOULD BE REPORTED IMMEDIATELY:  *FEVER GREATER THAN 100.5 F  *CHILLS WITH OR WITHOUT FEVER  NAUSEA AND VOMITING THAT IS NOT CONTROLLED WITH YOUR NAUSEA MEDICATION  *UNUSUAL SHORTNESS OF BREATH  *UNUSUAL BRUISING OR BLEEDING  TENDERNESS IN MOUTH AND THROAT WITH OR WITHOUT PRESENCE OF ULCERS  *URINARY PROBLEMS  *BOWEL PROBLEMS  UNUSUAL RASH Items with * indicate a potential emergency and should be followed up as soon as possible.  Feel free to call the clinic should you have any questions or concerns. The clinic phone number is [phone].  Please show the [place] [facility] at check-in to the Emergency Department and triage nurse.

## 2018-03-26 NOTE — Telephone Encounter (Signed)
Scheduled appt per 3/5 los - pt to get an updated schedule next visit.

## 2018-03-30 ENCOUNTER — Other Ambulatory Visit: Payer: Self-pay | Admitting: Internal Medicine

## 2018-03-30 DIAGNOSIS — E1159 Type 2 diabetes mellitus with other circulatory complications: Secondary | ICD-10-CM

## 2018-04-06 ENCOUNTER — Telehealth: Payer: Self-pay | Admitting: Internal Medicine

## 2018-04-06 DIAGNOSIS — G894 Chronic pain syndrome: Secondary | ICD-10-CM

## 2018-04-06 DIAGNOSIS — E1159 Type 2 diabetes mellitus with other circulatory complications: Secondary | ICD-10-CM

## 2018-04-06 MED ORDER — OXYCODONE-ACETAMINOPHEN 10-325 MG PO TABS: 1.0000 | ORAL_TABLET | Freq: Four times a day (QID) | ORAL | 0 refills | Status: DC | PRN

## 2018-04-06 MED ORDER — ACCU-CHEK AVIVA PLUS W/DEVICE KIT: PACK | 0 refills | Status: AC

## 2018-04-06 NOTE — Telephone Encounter (Signed)
° °  Pt needs refill on oxyCODONE-acetaminophen (PERCOCET) 10-325 MG tablet Blood Glucose Monitoring Suppl (ACCU-CHEK AVIVA PLUS) w/Device KIT  [id number] - [place], [place] - [address]; pt contact [phone]   Pt received the test strips but needs a prescription for meter

## 2018-04-09 ENCOUNTER — Ambulatory Visit: Payer: Self-pay

## 2018-04-09 ENCOUNTER — Other Ambulatory Visit: Payer: Self-pay

## 2018-04-09 ENCOUNTER — Ambulatory Visit: Payer: Self-pay | Admitting: Internal Medicine

## 2018-04-10 ENCOUNTER — Encounter: Payer: Self-pay | Admitting: Internal Medicine

## 2018-04-16 ENCOUNTER — Ambulatory Visit: Payer: Self-pay | Admitting: Internal Medicine

## 2018-04-16 ENCOUNTER — Telehealth: Payer: Self-pay | Admitting: Internal Medicine

## 2018-04-16 ENCOUNTER — Ambulatory Visit: Payer: Self-pay

## 2018-04-16 ENCOUNTER — Telehealth: Payer: Self-pay | Admitting: *Deleted

## 2018-04-16 ENCOUNTER — Other Ambulatory Visit: Payer: Self-pay

## 2018-04-16 NOTE — Telephone Encounter (Signed)
Call to patient-no answer, message left on recorder.Despina Hidden Cassady3/26/20202:39 PM

## 2018-04-16 NOTE — Telephone Encounter (Signed)
"  Brett [name] had throat discomfort yesterday.  He'd been outside.  This morning throat is sore.  Should he come in as scheduled at 10:30 am?"  Verbal order received and read back from Dr. Julien Nordmann for Brett Moloney [place] to stay home today.  Order given to [facility] at this time.

## 2018-04-16 NOTE — Telephone Encounter (Signed)
Pt's wife concerned about stomach issues. Pt still having diarrhea.  Pt is also having Low Blood Sugars and has questions about his insulin medications.  Pt has sore throat and was advised by the [facility] not to have his treatment today.  Pt's wife requesting to speak with Nurse Ulis Rias if possible.

## 2018-04-16 NOTE — Telephone Encounter (Signed)
Pt rtn call to [place].

## 2018-04-17 NOTE — Telephone Encounter (Signed)
Attempted to contact patient-unable to leave message recording "try your call again later".Despina Hidden Cassady3/27/20203:54 PM

## 2018-04-20 ENCOUNTER — Encounter: Payer: Self-pay | Admitting: *Deleted

## 2018-04-30 ENCOUNTER — Other Ambulatory Visit: Payer: Self-pay

## 2018-04-30 ENCOUNTER — Ambulatory Visit: Payer: Self-pay | Admitting: Internal Medicine

## 2018-04-30 ENCOUNTER — Ambulatory Visit: Payer: Self-pay

## 2018-05-05 ENCOUNTER — Other Ambulatory Visit: Payer: Self-pay | Admitting: Dietician

## 2018-05-05 NOTE — Telephone Encounter (Addendum)
Called to follow up on request for new glucometer: Ms Geffre reports they got is and have the supplies needed. I also asked about his diabetes an other concerns. Mrs. Axton reports Mr. Veronica has stomach/ bowel issues, scans are fine, eating very well, everything seems to go right thru with oily and orangeish diarhea several times a day(4+ times a day). Mr. Wesely is afraid to leave the house for fear of having diarrhea.  They mentioned it to Dr.  Eppie Gibson  in December. It had started 1-2 weeks before his visit. He tried imodium which did not work, no others OTCs. She asked about a probiotic.  His moderate to severe protein calorie malnutrition puts him at high risk for being immunocompromised.   Blood sugars- "They were dropping 50-60 or lower", but high in am now when he stopped th 20 units before dinner. He takes Lantus in am, took 10 units Novolog a few times but still had lows at night at times.  Checks qam- March CBGs- 318/46/54/61/67/80/38/230 in pm, recent CBGs in am when no Novolog in PM: 104/179/203/244  P: recommended they reduce Novolog to 5 units before dinner, check blood sugar at bedtime have a snack if <120 mg/dl. Told Mrs. Tucker that I would send a note to his new doctor about his diarrhea.  Refer to Oncology dietitian for suggestions. Call back in 1 week to follow up on blood sugars or sooner as needed. Debera Lat, RD 05/05/2018 2:43 PM.

## 2018-05-06 NOTE — Telephone Encounter (Signed)
Discussed with Debera Lat and agree with plan.

## 2018-05-07 ENCOUNTER — Ambulatory Visit: Payer: Self-pay | Admitting: Physician Assistant

## 2018-05-07 ENCOUNTER — Telehealth: Payer: Self-pay | Admitting: Physician Assistant

## 2018-05-07 ENCOUNTER — Ambulatory Visit: Payer: Self-pay

## 2018-05-07 ENCOUNTER — Other Ambulatory Visit: Payer: Self-pay

## 2018-05-07 NOTE — Telephone Encounter (Signed)
I spoke to the patient's wife regarding his cancellation today. She states the patient was not feeling well today. I asked her if she wanted to delay his treatment or if he was hoping to skip this treatment and resume for his next appointment on 5/7. She said she would talk to the patient regarding his preference. She was given our number to call us to reschedule his appointment. She expressed understanding.

## 2018-05-08 ENCOUNTER — Telehealth: Payer: Self-pay | Admitting: Physician Assistant

## 2018-05-08 NOTE — Telephone Encounter (Signed)
Called and left a message regarding the patient's cancelled appointment on 4/16. It appears the patient has missed the last couple cycles of his treatment. Encouraged the patient to call our office back to reschedule his missed appointment instead of waiting until May for his next scheduled appointment.

## 2018-05-11 ENCOUNTER — Telehealth: Payer: Self-pay | Admitting: Dietician

## 2018-05-11 NOTE — Telephone Encounter (Signed)
Follow up call for blood sugar and  Loose stools- Discussed with Mrs Paynter the oncology RDN recoomednatiopn of trail of low lactose and using imodium after loose stools. As far as his Blood sugars- she is not at home at the moment but does recall one night, his blood sugar dropped below 100 and last night he forgot his medicines and his sugar was only 200s this am.  Offered clear liquid supplements, ideas for low lactose and high calorie beverages and encouraged him to eat as much and what he wants to encourage weight gain. Support provided. Debera Lat, RD 05/11/2018 4:41 PM.

## 2018-05-22 ENCOUNTER — Encounter: Payer: Self-pay | Admitting: Internal Medicine

## 2018-05-27 ENCOUNTER — Other Ambulatory Visit: Payer: Self-pay | Admitting: *Deleted

## 2018-05-27 DIAGNOSIS — K2270 Barrett's esophagus without dysplasia: Secondary | ICD-10-CM

## 2018-05-27 MED ORDER — PANTOPRAZOLE SODIUM 40 MG PO TBEC: DELAYED_RELEASE_TABLET | ORAL | 1 refills | Status: DC

## 2018-05-28 ENCOUNTER — Inpatient Hospital Stay: Payer: Medicare HMO

## 2018-05-28 ENCOUNTER — Encounter: Payer: Self-pay | Admitting: Physician Assistant

## 2018-05-28 ENCOUNTER — Inpatient Hospital Stay: Payer: Medicare HMO | Attending: Internal Medicine

## 2018-05-28 ENCOUNTER — Inpatient Hospital Stay (HOSPITAL_BASED_OUTPATIENT_CLINIC_OR_DEPARTMENT_OTHER): Payer: Medicare HMO | Admitting: Physician Assistant

## 2018-05-28 ENCOUNTER — Other Ambulatory Visit: Payer: Self-pay

## 2018-05-28 VITALS — BP 120/80 | HR 73 | Temp 97.7°F | Resp 18 | Ht 67.0 in | Wt 115.0 lb

## 2018-05-28 DIAGNOSIS — C171 Malignant neoplasm of jejunum: Secondary | ICD-10-CM

## 2018-05-28 DIAGNOSIS — C784 Secondary malignant neoplasm of small intestine: Secondary | ICD-10-CM

## 2018-05-28 DIAGNOSIS — C3491 Malignant neoplasm of unspecified part of right bronchus or lung: Secondary | ICD-10-CM

## 2018-05-28 DIAGNOSIS — Z5112 Encounter for antineoplastic immunotherapy: Secondary | ICD-10-CM | POA: Diagnosis not present

## 2018-05-28 DIAGNOSIS — R197 Diarrhea, unspecified: Secondary | ICD-10-CM | POA: Diagnosis not present

## 2018-05-28 DIAGNOSIS — E1159 Type 2 diabetes mellitus with other circulatory complications: Secondary | ICD-10-CM

## 2018-05-28 DIAGNOSIS — Z79899 Other long term (current) drug therapy: Secondary | ICD-10-CM | POA: Insufficient documentation

## 2018-05-28 DIAGNOSIS — C3411 Malignant neoplasm of upper lobe, right bronchus or lung: Secondary | ICD-10-CM | POA: Diagnosis not present

## 2018-05-28 DIAGNOSIS — Z95828 Presence of other vascular implants and grafts: Secondary | ICD-10-CM

## 2018-05-28 DIAGNOSIS — E119 Type 2 diabetes mellitus without complications: Secondary | ICD-10-CM | POA: Insufficient documentation

## 2018-05-28 DIAGNOSIS — C3490 Malignant neoplasm of unspecified part of unspecified bronchus or lung: Secondary | ICD-10-CM

## 2018-05-28 LAB — CMP (CANCER CENTER ONLY)
ALT: 56 U/L — ABNORMAL HIGH (ref 0–44)
AST: 48 U/L — ABNORMAL HIGH (ref 15–41)
Albumin: 3.1 g/dL — ABNORMAL LOW (ref 3.5–5.0)
Alkaline Phosphatase: 107 U/L (ref 38–126)
Anion gap: 7 (ref 5–15)
BUN: 5 mg/dL — ABNORMAL LOW (ref 6–20)
CO2: 24 mmol/L (ref 22–32)
Calcium: 7.8 mg/dL — ABNORMAL LOW (ref 8.9–10.3)
Chloride: 105 mmol/L (ref 98–111)
Creatinine: 0.77 mg/dL (ref 0.61–1.24)
GFR, Est AFR Am: >60 mL/min (ref >60)
GFR, Estimated: >60 mL/min (ref >60)
Glucose, Bld: 342 mg/dL — ABNORMAL HIGH (ref 70–99)
Potassium: 3.9 mmol/L (ref 3.5–5.1)
Sodium: 136 mmol/L (ref 135–145)
Total Bilirubin: 0.3 mg/dL (ref 0.3–1.2)
Total Protein: 5.6 g/dL — ABNORMAL LOW (ref 6.5–8.1)

## 2018-05-28 LAB — CBC WITH DIFFERENTIAL (CANCER CENTER ONLY)
Abs Immature Granulocytes: 0.03 K/uL (ref 0.00–0.07)
Basophils Absolute: 0 K/uL (ref 0.0–0.1)
Basophils Relative: 1 %
Eosinophils Absolute: 0.1 K/uL (ref 0.0–0.5)
Eosinophils Relative: 2 %
HCT: 40.4 % (ref 39.0–52.0)
Hemoglobin: 13.1 g/dL (ref 13.0–17.0)
Immature Granulocytes: 1 %
Lymphocytes Relative: 22 %
Lymphs Abs: 1.4 K/uL (ref 0.7–4.0)
MCH: 30.5 pg (ref 26.0–34.0)
MCHC: 32.4 g/dL (ref 30.0–36.0)
MCV: 94.2 fL (ref 80.0–100.0)
Monocytes Absolute: 0.5 K/uL (ref 0.1–1.0)
Monocytes Relative: 8 %
Neutro Abs: 4.4 K/uL (ref 1.7–7.7)
Neutrophils Relative %: 66 %
Platelet Count: 185 K/uL (ref 150–400)
RBC: 4.29 MIL/uL (ref 4.22–5.81)
RDW: 13.3 % (ref 11.5–15.5)
WBC Count: 6.5 K/uL (ref 4.0–10.5)
nRBC: 0 % (ref 0.0–0.2)

## 2018-05-28 LAB — TSH: TSH: 4.32 u[IU]/mL — ABNORMAL HIGH (ref 0.320–4.118)

## 2018-05-28 LAB — GLUCOSE, CAPILLARY: Glucose-Capillary: 204 mg/dL — ABNORMAL HIGH (ref 70–99)

## 2018-05-28 MED ORDER — SODIUM CHLORIDE 0.9 % IV SOLN
200.0000 mg | Freq: Once | INTRAVENOUS | Status: AC
  Administered 2018-05-28: 200 mg via INTRAVENOUS
  Filled 2018-05-28: qty 8

## 2018-05-28 MED ORDER — SODIUM CHLORIDE 0.9% FLUSH
10.0000 mL | INTRAVENOUS | Status: DC | PRN
  Administered 2018-05-28: 15:00:00 10 mL
  Filled 2018-05-28: qty 10

## 2018-05-28 MED ORDER — PREDNISONE 50 MG PO TABS: ORAL_TABLET | ORAL | 0 refills | Status: DC

## 2018-05-28 MED ORDER — SODIUM CHLORIDE 0.9 % IV SOLN
Freq: Once | INTRAVENOUS | Status: AC
  Administered 2018-05-28: 13:00:00 via INTRAVENOUS
  Filled 2018-05-28: qty 250

## 2018-05-28 MED ORDER — SODIUM CHLORIDE 0.9% FLUSH
10.0000 mL | INTRAVENOUS | Status: DC | PRN
  Administered 2018-05-28: 10 mL
  Filled 2018-05-28: qty 10

## 2018-05-28 MED ORDER — INSULIN REGULAR HUMAN 100 UNIT/ML IJ SOLN
10.0000 [IU] | Freq: Once | INTRAMUSCULAR | Status: AC
  Administered 2018-05-28: 13:00:00 10 [IU] via SUBCUTANEOUS
  Filled 2018-05-28: qty 10

## 2018-05-28 MED ORDER — HEPARIN SOD (PORK) LOCK FLUSH 100 UNIT/ML IV SOLN
500.0000 [IU] | Freq: Once | INTRAVENOUS | Status: AC | PRN
  Administered 2018-05-28: 500 [IU]
  Filled 2018-05-28: qty 5

## 2018-05-28 NOTE — Progress Notes (Signed)
[facility] OFFICE PROGRESS NOTE  Brett Falcon, MD [place] [address]  DIAGNOSIS: Stage IV (T1b, N0, M1b) non-small cell lung cancer, adenocarcinoma presented with right upper lobe lung nodule and recent metastasis to the small intestine. This was initially diagnosed in September 2017.  Genomic Alterations Identified? ERBB2 amplification - equivocal? CDKN2A p16INK4a E88* and p14ARF H846N SMARCA4 splice site [id number]>MW SPTA1 E2022* TOP2A amplification TP53 A159P Additional Findings? Microsatellite status MS-Stable Tumor Mutation Burden TMB-Intermediate; 18 Muts/Mb Additional Disease-relevant Genes with No Reportable Alterations Identified? EGFR KRAS ALK BRAF MET RET ROS1   PRIOR THERAPY:  1) Status post right VATS with right upper lobectomy and mediastinal lymph node dissection under the care of Dr. Roxan Hockey on 10/18/2015 and the final pathology was consistent with stage IA (T1b, N0, MX). 2) upper endoscopy on 01/05/2016 showed normal esophagus, normal stomach but there was occasional mass around 3.0 CM in length circumferential nonobstructing in the jejunum. The final pathology was consistent with metastatic adenocarcinoma. 3) status post laparoscopic laparotomy and resection of proximal lesion and and distal jejunum/proximal ileum under the care of Dr. Hassell Done 1 02/27/2016. 3)  Systemic chemotherapy with carboplatin for AUC of 5 and Alimta 500 MG/M2 every 3 weeks. First dose 04/04/2016. Status post 2 cycles. Last dose was given 04/21/2016 discontinued secondary to disease progression.  CURRENT THERAPY: Second line immunotherapy with Ketruda 200 mg IV every 2 weeks, first dose 05/30/2016. Status post 32 cycles.  INTERVAL HISTORY: Brett [name] 58 y.o. male returns to the clinic for a follow-up visit. The patient had canceled his last 2 appointments due to not having a sore throat and never returned our calls to reschedule. The patient  states that he is feeling well today without any concerning complaints except for diarrhea. He states he has had diarrhea ~6 times a day since December. He is unclear if his diarrhea has worsened since onset since it fluctuates. The patient is unable to associate his diarrhea to any particular triggers such a time of day, food, medications, etc. His diarrhea has not eased up since missing his last two immunotherapy treatments. He denies associated abdominal pain, fevers, nausea, vomiting, or blood. He describes the stools as appearing somewhat orange and loose. He states he still eats per his usual and it has not affected his appetite.   With regard to his treatment with immunotherapy, he continues to tolerate treatment well without any adverse side effects.  He denies any fever, chills, night sweats, or weight loss.  He denies any chest pain, shortness of breath, cough, or hemoptysis.  He denies any nausea, vomiting,  or constipation.  He denies any headache or visual changes.  He denies any rashes or skin changes. He is here today for evaluation before starting cycle #33.  MEDICAL HISTORY: Past Medical History:  Diagnosis Date  . Barrett's esophagus 07/01/2013   Without dysplasia on biopsy 09/03/2012. Repeat EGD recommended 08/2015  . Bilateral cataracts 02/13/2017  . Carotid artery stenosis 07/01/2013   Requiring right sided stent   . Chronic pain syndrome 07/01/2013  . Closed head injury with brief loss of consciousness ([place]) 07/22/2010   Head trapped in a hydraulic device at work.  Fracture of orbital bones on right and brief loss of consciousness per report.  . 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 There was likely a strong psycho-social overlay in  regard to the cognitive deficits in the form of mood disorder with psychotic features and mixed anxiety  symptomatology. His primary tested cognitive deficits are in the areas of attention, executi  . Daily headache "since 07/2010"   constantly  . Degenerative joint disease of cervical spine 07/01/2013  . Dupuytren's contracture of both hands 04/08/2014  . Encounter for antineoplastic chemotherapy 10/05/2015  . Encounter for antineoplastic immunotherapy 05/24/2016  . Erectile dysfunction associated with type 2 diabetes mellitus ([place]) 07/01/2013  . Fibromyalgia 07/01/2013  . Goals of care, counseling/discussion 03/28/2016  . History of blood transfusion 11/28/2015   "suppose to get his first today" (11/28/2015)  . Hyperlipidemia LDL goal < 100 07/01/2013  . Intractable hiccups 04/11/2016  . 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 Memory changes    "memory issues" from head injury  . Moderate protein-calorie malnutrition ([place]) 11/29/2015  . Osteoarthritis of right thumb 10/21/2014  . Peripheral vascular occlusive disease ([place]) 07/01/2013   Requiring 2 arterial stents above the left knee per report  . Pneumonia ~ 2006/2007  . Post traumatic stress disorder 07/01/2013  . Primary lung adenocarcinoma ([place]) dx'd 08/2015   "right lung"  . Severe major depression with psychotic features ([place]) 04/15/2011  . Tobacco abuse 07/01/2013  . Type 2 diabetes mellitus with vascular disease ([place]) 07/01/2013   Left lower extremity and right carotid stenting    ALLERGIES:  is allergic to gabapentin; lyrica [pregabalin]; jardiance [empagliflozin]; celebrex [celecoxib]; and contrast media [iodinated diagnostic agents].  MEDICATIONS:  Current Outpatient Medications  Medication Sig Dispense Refill  . ACCU-CHEK SOFTCLIX LANCETS lancets Use to test blood glucose 1-2 times daily. Dx Code E11.59 100 each 12  . aspirin EC 81 MG tablet Take 81 mg by mouth daily.     Marland Kitchen atorvastatin (LIPITOR) 40 MG tablet Take 1 tablet (40 mg total) by mouth daily. 90 tablet 3  . Blood Glucose Monitoring Suppl (ACCU-CHEK AVIVA PLUS) w/Device KIT Use  to test blood glucose 1-2 times daily. Dx Code E11.59 1 kit 0  . cyclobenzaprine (FLEXERIL) 10 MG tablet Take 1 tablet (10 mg total) by mouth 3 (three) times daily as needed for muscle spasms. (Patient not taking: Reported on 03/26/2018) 90 tablet 11  . diphenhydrAMINE (BENADRYL) 25 mg capsule Take 2 capsules (50 mg total) by mouth as directed. Take prior to CT scan as directed (Patient not taking: Reported on 12/03/2017) 30 capsule 0  . glipiZIDE (GLUCOTROL) 10 MG tablet Take 1 tablet (10 mg total) by mouth 2 (two) times daily before a meal. 180 tablet 3  . glucose blood test strip USE TO TEST BLOOD GLUCOSE 1 TO 2 TIMES DAILY 100 each 4  . hydrOXYzine (ATARAX/VISTARIL) 10 MG tablet Take 1 tablet (10 mg total) by mouth 3 (three) times daily as needed for itching. (Patient not taking: Reported on 03/26/2018) 30 tablet 0  . insulin aspart (NOVOLOG) 100 UNIT/ML FlexPen Inject 20 Units into the skin daily. Just before dinner (Patient taking differently: Inject 5 Units into the skin daily. Just before dinner) 5 pen 3  . Insulin Glargine (LANTUS) 100 UNIT/ML Solostar Pen Inject 20 Units into the skin daily. 15 mL 3  . Insulin Pen Needle (PEN NEEDLES) 32G X 4 MM MISC 1 pen by Does not apply route 2 (two) times daily. Dx Code E11.59 200 each 6  . lidocaine-prilocaine (EMLA) cream Apply 1 application topically as needed. 30 g 0  . lisinopril (PRINIVIL,ZESTRIL) 5 MG tablet Take 0.5 tablets (2.5 mg total) by mouth  daily. 90 tablet 3  . metFORMIN (GLUCOPHAGE-XR) 500 MG 24 hr tablet Take 1 tablet (500 mg total) by mouth 2 (two) times daily. 180 tablet 3  . metoCLOPramide (REGLAN) 10 MG tablet TAKE 1 TABLET BY MOUTH FOUR TIMES DAILY AS NEEDED AS DIRECTED (Patient not taking: Reported on 10/02/2017) 30 tablet 0  . ondansetron (ZOFRAN) 4 MG tablet Take 1 tablet (4 mg total) by mouth 3 (three) times daily. (Patient not taking: Reported on 12/03/2017) 20 tablet 0  . [START ON 06/07/2018] oxyCODONE-acetaminophen (PERCOCET)  10-325 MG tablet Take 1-2 tablets by mouth every 6 (six) hours as needed for pain. 240 tablet 0  . pantoprazole (PROTONIX) 40 MG tablet TAKE 1 TABLET(40 MG) BY MOUTH DAILY 90 tablet 1  . predniSONE (DELTASONE) 50 MG tablet TAKE 1 TABLET BY MOUTH AT 13 HOURS, 7 HOURS, AND 1 HOUR PRIOR TO SCAN 3 tablet 0  . prochlorperazine (COMPAZINE) 10 MG tablet Take 10 mg by mouth every 6 (six) hours as needed for nausea or vomiting.    . prochlorperazine (COMPAZINE) 25 MG suppository Place 1 suppository (25 mg total) rectally every 12 (twelve) hours as needed for refractory nausea / vomiting. (Patient not taking: Reported on 12/03/2017) 12 suppository 11  . senna-docusate (SENOKOT-S) 8.6-50 MG tablet Take 1-2 tablets by mouth 2 (two) times daily as needed for mild constipation or moderate constipation. (Patient not taking: Reported on 03/26/2018) 30 tablet 1  . sitaGLIPtin (JANUVIA) 100 MG tablet Take 1 tablet (100 mg total) by mouth daily. 90 tablet 3   Current Facility-Administered Medications  Medication Dose Route Frequency Provider Last Rate Last Dose  . insulin regular (NOVOLIN R) 100 units/mL injection 10 Units  10 Units Subcutaneous Once ,  L, PA-C        SURGICAL HISTORY:  Past Surgical History:  Procedure Laterality Date  . CAROTID STENT Right ?2014  . COLONOSCOPY N/A 11/30/2015   Procedure: COLONOSCOPY;  Surgeon: Teena Irani, MD;  Location[facility] ENDOSCOPY;  Service: Endoscopy;  Laterality: N/A;  . ESOPHAGOGASTRODUODENOSCOPY N/A 11/30/2015   Procedure: ESOPHAGOGASTRODUODENOSCOPY (EGD);  Surgeon: Teena Irani, MD;  Location: [facility] ENDOSCOPY;  Service: Endoscopy;  Laterality: N/A;  . ESOPHAGOGASTRODUODENOSCOPY (EGD) WITH PROPOFOL N/A 01/05/2016   Procedure: ESOPHAGOGASTRODUODENOSCOPY (EGD) WITH PROPOFOL;  Surgeon: Teena Irani, MD;  Location: [facility] ENDOSCOPY;  Service: Endoscopy;  Laterality: N/A;  . FEMORAL ARTERY STENT Left 05/2012; ~ 2015   Archie Endo 06/04/2012; Raechel Chute report  . FRACTURE  SURGERY    . GIVENS CAPSULE STUDY N/A 12/22/2015   Procedure: GIVENS CAPSULE STUDY;  Surgeon: Wonda Horner, MD;  Location[facility] ENDOSCOPY;  Service: Endoscopy;  Laterality: N/A;  . HARDWARE REMOVAL Right 11/15/2011   Removal of deep frontozygomatic orbital hardware/notes 11/15/2011  . HERNIA REPAIR  [id number]   Umbilical  . LAPAROSCOPY N/A 02/27/2016   Procedure: LAPAROSCOPY, LAPAROTOMY  WITH TWO SMALL BOWEL RESECTION;  Surgeon: Johnathan Hausen, MD;  Location: [facility] ORS;  Service: General;  Laterality: N/A;  . ORIF ORBITAL FRACTURE Right 08/15/2010    caught in a hydraulic machine; open reduction internal fixation of orbital rim fracture and open reduction of zygomatic arch fracture  Archie Endo 10/13/2009  . PORTACATH PLACEMENT Left 04/04/2016   Procedure: INSERTION PORT-A-CATH LEFT CHEST;  Surgeon: Melrose Nakayama, MD;  Location: [facility];  Service: Thoracic;  Laterality: Left;  Marland Kitchen VIDEO ASSISTED THORACOSCOPY (VATS)/ LOBECTOMY Right 10/18/2015   Procedure: VIDEO ASSISTED THORACOSCOPY (VATS)/ LOBECTOMY;  Surgeon: Melrose Nakayama, MD;  Location: [facility];  Service: Thoracic;  Laterality: Right;  Marland Kitchen VIDEO BRONCHOSCOPY Bilateral 09/21/2015   Procedure: VIDEO BRONCHOSCOPY WITH FLUORO;  Surgeon: Juanito Doom, MD;  Location: [facility] ENDOSCOPY;  Service: Cardiopulmonary;  Laterality: Bilateral;    REVIEW OF SYSTEMS:   Review of Systems  Constitutional: Negative for appetite change, chills, fatigue, fever and unexpected weight change.  HENT:   Negative for mouth sores, nosebleeds, sore throat and trouble swallowing.   Eyes: Negative for eye problems and icterus.  Respiratory: Negative for cough, hemoptysis, shortness of breath and wheezing.   Cardiovascular: Negative for chest pain and leg swelling.  Gastrointestinal: Positive for diarrhea. Negative for abdominal pain, constipation, nausea and vomiting.  Genitourinary: Negative for bladder incontinence, difficulty urinating, dysuria, frequency and hematuria.    Musculoskeletal: Negative for back pain, gait problem, neck pain and neck stiffness.  Skin: Negative for itching and rash.  Neurological: Negative for dizziness, extremity weakness, gait problem, headaches, light-headedness and seizures.  Hematological: Negative for adenopathy. Does not bruise/bleed easily.  Psychiatric/Behavioral: Negative for confusion, depression and sleep disturbance. The patient is not nervous/anxious.     PHYSICAL EXAMINATION:  Blood pressure 120/80, pulse 73, temperature 97.7 F (36.5 C), temperature source Oral, resp. rate 18, height 5' 7" (1.702 m), weight 115 lb (52.2 kg), SpO2 100 %.  ECOG PERFORMANCE STATUS: 1 - Symptomatic but completely ambulatory  Physical Exam  Constitutional: Oriented to person, place, and time and thin-appearing male, and in no distress.  HENT:  Head: Normocephalic and atraumatic.  Mouth/Throat: Oropharynx is clear and moist. No oropharyngeal exudate.  Eyes: Conjunctivae are normal. Right eye exhibits no discharge. Left eye exhibits no discharge. No scleral icterus.  Neck: Normal range of motion. Neck supple.  Cardiovascular: Normal rate, regular rhythm, normal heart sounds and intact distal pulses.   Pulmonary/Chest: Effort normal and breath sounds normal. No respiratory distress. No wheezes. No rales.  Abdominal: Soft. Bowel sounds are normal. Exhibits no distension and no mass. There is no tenderness.  Musculoskeletal: Normal range of motion. Exhibits no edema.  Lymphadenopathy:    No cervical adenopathy.  Neurological: Alert and oriented to person, place, and time. Exhibits normal muscle tone. Gait normal. Coordination normal.  Skin: Skin is warm and dry. No rash noted. Not diaphoretic. No erythema. No pallor.  Psychiatric: Mood, memory and judgment normal.  Vitals reviewed.  LABORATORY DATA: Lab Results  Component Value Date   WBC 6.5 05/28/2018   HGB 13.1 05/28/2018   HCT 40.4 05/28/2018   MCV 94.2 05/28/2018   PLT 185  05/28/2018      Chemistry      Component Value Date/Time   NA 136 05/28/2018 1104   NA 140 01/23/2017 0910   K 3.9 05/28/2018 1104   K 4.5 01/23/2017 0910   CL 105 05/28/2018 1104   CO2 24 05/28/2018 1104   CO2 28 01/23/2017 0910   BUN 5 (L) 05/28/2018 1104   BUN 8.1 01/23/2017 0910   CREATININE 0.77 05/28/2018 1104   CREATININE 0.8 01/23/2017 0910   GLU 398 (H) 08/08/2016 1257      Component Value Date/Time   CALCIUM 7.8 (L) 05/28/2018 1104   CALCIUM 9.6 01/23/2017 0910   ALKPHOS 107 05/28/2018 1104   ALKPHOS 113 01/23/2017 0910   AST 48 (H) 05/28/2018 1104   AST 11 01/23/2017 0910   ALT 56 (H) 05/28/2018 1104   ALT 12 01/23/2017 0910   BILITOT 0.3 05/28/2018 1104   BILITOT 0.40 01/23/2017 0910       RADIOGRAPHIC STUDIES:  No  results found.   ASSESSMENT/PLAN:  This is a very pleasant 58 year old Caucasian male with metastatic non-small cell lung cancer, adenocarcinoma.  He presented with a right upper lobe lung nodule. He was initially diagnosed in September 2017.  He is status post a right upper lobectomy with lymph node dissection.  Unfortunately he was found to have metastatic disease in the jejunum and terminal ileum.  He underwent a surgical resection of the proximal and distal jejunum as well as proximal ileum.  The final pathology was consistent with high-grade carcinoma.   The patient then underwent systemic chemotherapy with carboplatin and Alimta for 2 cycles.  This was discontinued secondary to disease progression. The patient is currently on second line immunotherapy with Keytruda 200 mg IV every 3 weeks.  He is status post 32 cycles.  He continues to tolerate treatment well without any adverse effects.  The patient was seen with Dr. Julien Nordmann today. We recommend he proceed with cycle #33 today.  I will arrange for restaging CT scan to be performed prior to his next visit to restage his disease. We will also evaluate whether there is a clear etiology for  his diarrhea related to his disease. He was also given a prescription for 50 mg of prednisone to take 13 hours, 7 hours, and 2 hours before his CT scan due to his mild allergy to contrast dye. He also was instructed to take 25 mg of benadryl 2 hours before his scan.   I will see him back for follow-up in 3 weeks for evaluation and to discuss his scan results prior to starting cycle #34.   The patient's blood sugars continue to be elevated today. The patient states that he took his diabetes medications today  with the exception of his insulin. He checks his blood sugar once a day. He recently saw his PCP to adjust his diabetes medication. I advised the patient to keep a log of his blood sugars and administration of his medication and follow up with his PCP if his BS readings continue to be suboptimal. He was especially cautioned to check his blood sugar more frequently on the day of his scan due to taking prednisone. He will be given a 10 unit one time dose of insulin while in the clinic today.   The patient was advised to call immediately if he has any concerning symptoms in the interval. The patient voices understanding of current disease status and treatment options and is in agreement with the current care plan. All questions were answered. The patient knows to call the clinic with any problems, questions or concerns. We can certainly see the patient much sooner if necessary   Orders Placed This Encounter  Procedures  . CT Abdomen Pelvis W Contrast    Standing Status:   Future    Standing Expiration Date:   05/28/2019    Order Specific Question:   ** REASON FOR EXAM (FREE TEXT)    Answer:   Restaging Lung Cancer    Order Specific Question:   If indicated for the ordered procedure, I authorize the administration of contrast media per Radiology protocol    Answer:   Yes    Order Specific Question:   Preferred imaging location?    Answer:   [facility]    Order Specific Question:   Is  Oral Contrast requested for this exam?    Answer:   Yes, Per Radiology protocol    Order Specific Question:   Radiology Contrast Protocol -  do NOT remove file path    Answer:   _0 [email or web address]  . CT Chest W Contrast    Standing Status:   Future    Standing Expiration Date:   05/28/2019    Order Specific Question:   ** REASON FOR EXAM (FREE TEXT)    Answer:   Restaging CT scan    Order Specific Question:   If indicated for the ordered procedure, I authorize the administration of contrast media per Radiology protocol    Answer:   Yes    Order Specific Question:   Preferred imaging location?    Answer:   [facility]    Order Specific Question:   Radiology Contrast Protocol - do NOT remove file path    Answer:   _1 [email or web address]      L , PA-C 05/28/18  ADDENDUM: Hematology/Oncology Attending: I had a face-to-face encounter with the patient today.  I recommended his care plan.  This is a very pleasant 58 years old white male with metastatic non-small cell lung cancer, adenocarcinoma.  He is currently undergoing treatment with immunotherapy with Keytruda status post 32 cycles.  The patient missed the last 2 cycles because of concern about the COVID19 pandemic. He is here today and feeling fine except for few episodes of diarrhea that are not different from his normal loose stool that has been going on for several months.  He did not have any improvement when he was off treatment. I recommended for him to proceed with cycle #3 today as planned. He will continue on Imodium on as-needed basis for the diarrhea. For the diabetes mellitus he will continue his treatment under the care of Dr. Eppie Gibson.   He will come back for follow-up visit in 3 weeks for evaluation before the next cycle of his treatment with repeat CT scan of the chest, abdomen and pelvis for restaging of his disease. The patient was advised to call  immediately if he has any concerning symptoms in the interval.  Disclaimer: This note was dictated with voice recognition software. Similar sounding words can inadvertently be transcribed and may be missed upon review. Eilleen Kempf, MD 05/28/18

## 2018-05-28 NOTE — Patient Instructions (Signed)
[facility] Discharge Instructions for Patients Receiving Chemotherapy  Today you received the following chemotherapy agents:  Keytruda.  To help prevent nausea and vomiting after your treatment, we encourage you to take your nausea medication as directed.   If you develop nausea and vomiting that is not controlled by your nausea medication, call the clinic.   BELOW ARE SYMPTOMS THAT SHOULD BE REPORTED IMMEDIATELY:  *FEVER GREATER THAN 100.5 F  *CHILLS WITH OR WITHOUT FEVER  NAUSEA AND VOMITING THAT IS NOT CONTROLLED WITH YOUR NAUSEA MEDICATION  *UNUSUAL SHORTNESS OF BREATH  *UNUSUAL BRUISING OR BLEEDING  TENDERNESS IN MOUTH AND THROAT WITH OR WITHOUT PRESENCE OF ULCERS  *URINARY PROBLEMS  *BOWEL PROBLEMS  UNUSUAL RASH Items with * indicate a potential emergency and should be followed up as soon as possible.  Feel free to call the clinic should you have any questions or concerns. The clinic phone number is [phone].  Please show the CHEMO ALERT CARD at check-in to the Emergency Department and triage nurse.    

## 2018-05-29 ENCOUNTER — Telehealth: Payer: Self-pay | Admitting: Physician Assistant

## 2018-05-29 NOTE — Telephone Encounter (Signed)
Added additional cycles per 5/07 los - pt to get an updated schedule next visit.

## 2018-06-03 ENCOUNTER — Ambulatory Visit (INDEPENDENT_AMBULATORY_CARE_PROVIDER_SITE_OTHER): Payer: Medicare HMO | Admitting: Internal Medicine

## 2018-06-03 ENCOUNTER — Other Ambulatory Visit: Payer: Self-pay

## 2018-06-03 DIAGNOSIS — R197 Diarrhea, unspecified: Secondary | ICD-10-CM | POA: Diagnosis not present

## 2018-06-03 DIAGNOSIS — C3491 Malignant neoplasm of unspecified part of right bronchus or lung: Secondary | ICD-10-CM

## 2018-06-03 DIAGNOSIS — Z79891 Long term (current) use of opiate analgesic: Secondary | ICD-10-CM | POA: Diagnosis not present

## 2018-06-03 DIAGNOSIS — E1159 Type 2 diabetes mellitus with other circulatory complications: Secondary | ICD-10-CM

## 2018-06-03 DIAGNOSIS — Z794 Long term (current) use of insulin: Secondary | ICD-10-CM | POA: Diagnosis not present

## 2018-06-03 DIAGNOSIS — Z87820 Personal history of traumatic brain injury: Secondary | ICD-10-CM | POA: Diagnosis not present

## 2018-06-03 DIAGNOSIS — R413 Other amnesia: Secondary | ICD-10-CM

## 2018-06-03 DIAGNOSIS — Z79899 Other long term (current) drug therapy: Secondary | ICD-10-CM | POA: Diagnosis not present

## 2018-06-03 DIAGNOSIS — G894 Chronic pain syndrome: Secondary | ICD-10-CM

## 2018-06-03 NOTE — Progress Notes (Signed)
[facility] Telephone Encounter Continuity Care Appointment  HPI:   This telephone encounter was created for [name] [name] on 06/03/2018 for the following purpose/cc Follow up of diabetes, pain management and lung cancer.  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  His wife was present on the call and assisted in the history due to memory issues.  The main issue concerning them was diarrhea.  This appears to have been going on since at least December.  He will go to the bathroom up to 4 times a day, soft stools.  We reviewed his medications and he is on Keytruda for his lung cancer which can cause diarrhea in up to 25% of patients taking it.  They are not sure if it started when he started this medication.  He has no signs/symptoms of infection, no fever, abdominal pain.  He takes immodium and this helps, they were just concerned about what else they could do.  His wife inquired about probiotics.  I ran a quick drug interaction check and probiotics do not interfere with Keytruda.  I told them to also run this by their oncologist.    The next issue we discussed was Brett [name]'s pain management.  He has been doing very well on his current dose of narcotics.  They were concerned with Dr. Eppie Gibson leaving that they would not be able to get refills.  I reassured them that the documented reasons for Brett [name]'s pain were reasonable and that his Rx's could continue.  He should have one more on file at the pharmacy, and they will call me if he does not.  Otherwise, he is doing well.  He has no other complaints today.  He has not had fever, cough, SOB, chest pain or runny nose.  He and his family are social distancing except to take Brett [name] to the doctor.    Finally, as regards his diabetes.  He has lost weight and appetite in the course of his treatment for cancer.  He has only been taking his novolog, which is reported as 20 units, at 5 units per day.  Some days he does not take this at  all if his blood sugar is < 150 in the mornings.  He reports no low blood sugars and no significantly high sugars.  I advised him to keep this regimen and to continue to monitor his sugars.     Past Medical History:  Past Medical History:  Diagnosis Date  . Barrett's esophagus 07/01/2013   Without dysplasia on biopsy 09/03/2012. Repeat EGD recommended 08/2015  . Bilateral cataracts 02/13/2017  . Carotid artery stenosis 07/01/2013   Requiring right sided stent   . Chronic pain syndrome 07/01/2013  . Closed head injury with brief loss of consciousness ([place]) 07/22/2010   Head trapped in a hydraulic device at work.  Fracture of orbital bones on right and brief loss of consciousness per report.  . 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 There was likely a strong psycho-social overlay in regard to the cognitive deficits in the form of mood disorder with psychotic features and mixed anxiety symptomatology. His primary tested cognitive deficits are in the areas of attention, executi  . Daily headache "since 07/2010"   constantly  . Degenerative joint disease of cervical spine 07/01/2013  . Dupuytren's contracture of both hands 04/08/2014  . Encounter for antineoplastic chemotherapy 10/05/2015  .  Encounter for antineoplastic immunotherapy 05/24/2016  . Erectile dysfunction associated with type 2 diabetes mellitus ([place]) 07/01/2013  . Fibromyalgia 07/01/2013  . Goals of care, counseling/discussion 03/28/2016  . History of blood transfusion 11/28/2015   "suppose to get his first today" (11/28/2015)  . Hyperlipidemia LDL goal < 100 07/01/2013  . Intractable hiccups 04/11/2016  . 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 Memory changes    "memory issues" from head injury  . Moderate protein-calorie malnutrition ([place]) 11/29/2015  . Osteoarthritis of right thumb 10/21/2014  .  Peripheral vascular occlusive disease ([place]) 07/01/2013   Requiring 2 arterial stents above the left knee per report  . Pneumonia ~ 2006/2007  . Post traumatic stress disorder 07/01/2013  . 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 Severe major depression with psychotic features ([place]) 04/15/2011  . Tobacco abuse 07/01/2013  . Type 2 diabetes mellitus with vascular disease ([place]) 07/01/2013   Left lower extremity and right carotid stenting      ROS:   As per HPI, otherwise negative.    Assessment / Plan / Recommendations:   Please see A&P under problem oriented charting for assessment of the patient's acute and chronic medical conditions.   As always, pt is advised that if symptoms worsen or new symptoms arise, they should go to an urgent care facility or to to ER for further evaluation.   Consent and Medical Decision Making:   This is a telephone encounter between [employer] and Brett [name] on 06/03/2018 for follow up of chronic health conditions. The visit was conducted with the patient located at home and Brett [name] at [facility]. The patient's identity was confirmed using their DOB and current address. The patient has consented to being evaluated through a telephone encounter and understands the associated risks (an examination cannot be done and the patient may need to come in for an appointment) / benefits (allows the patient to remain at home, decreasing exposure to coronavirus). I personally spent 18 minutes on medical discussion.    Follow up in 1-2 months.  In Brett [name]'s case, virtual visit would likely be the best option until the peak of Coronovirus has passed.

## 2018-06-05 MED ORDER — CULTURELLE IMMUNE DEFENSE PO CAPS: 1.0000 | ORAL_CAPSULE | Freq: Every day | ORAL | 11 refills | Status: DC

## 2018-06-05 MED ORDER — INSULIN ASPART 100 UNIT/ML FLEXPEN: 5.0000 [IU] | PEN_INJECTOR | SUBCUTANEOUS | 6 refills | Status: DC

## 2018-06-05 NOTE — Assessment & Plan Note (Signed)
Continues to be an issue.  I think this is most likely a side effect of his chemotherapy, Keytruda.  Imodium is helping and I advised that they can continue to use this medication and target one-two soft bowel movements a day. I also anticipate it would be safe to start probiotics, and we discussed brands which could be obtained.  Monitor at next visit.

## 2018-06-05 NOTE — Assessment & Plan Note (Signed)
We will continue Kenneth on his previous pain regimen.  Follow up at next visit.

## 2018-06-05 NOTE — Patient Instructions (Signed)
Instructions given verbally.

## 2018-06-05 NOTE — Assessment & Plan Note (Signed)
Brett [name] reports that he is doing well.  He does not take as much insulin as he is supposed to, but this is due to lower blood sugars in the setting of weight loss.  He notes that he does not want to have low blood sugars, so he has self decreased his novolog to 5 units PRN depending on his blood sugar.  He is still taking metformin, januvia, Levemir at 20 units.  He continues to check his Blood sugar.    Last A1C was 11.6 5 months ago.  We should get a repeat in the near future.  Given his cancer status however, and ongoing treatment, I would advise him to remain out of the hospital area until the [place] peak has passed.  This may be in to August.    I advised him to continue the lower dose of novolog to avoid hypoglycemia and to call the clinic if his sugars start becoming lower.

## 2018-06-16 ENCOUNTER — Telehealth: Payer: Self-pay | Admitting: *Deleted

## 2018-06-16 NOTE — Telephone Encounter (Signed)
"  Brett [name] with Imaging trying to reach nurse to ensure Brett [name] has dye allergy medicines to use before tomorrow's CT scans."  Prednisone ordered 05-28-2018.  Called Brett [name].  Spouse Brett [name] confirms medications on hand.  Wrote the following schedule for tomorrow's 12:30 pm Abdomen/Pelvis CT scan.  06-16-2018:  (13 hr) 11:30 pm Decadron 50 mg  06-17-2018:   (7 hr) 5:30 am Decadron 50 mg  NPO 4 hrs before scans begins at 8:30 am.   Drink contrast.  May drink only enough water to swallow pills.     10:30 am drink first contrast (2 hrs before) (1 hr) 11:30 am Decadron 50 mg, Benadyl 50 mg   11:30 am drink second contrast (1 hr before) Arrive radiology registration 12:15 pm

## 2018-06-17 ENCOUNTER — Ambulatory Visit (HOSPITAL_COMMUNITY)
Admission: RE | Admit: 2018-06-17 | Discharge: 2018-06-17 | Disposition: A | Discharge status: Home / Self Care | Payer: Medicare HMO | Source: Ambulatory Visit | Attending: Physician Assistant | Admitting: Physician Assistant

## 2018-06-17 ENCOUNTER — Other Ambulatory Visit: Payer: Self-pay

## 2018-06-17 ENCOUNTER — Encounter (HOSPITAL_COMMUNITY): Payer: Self-pay

## 2018-06-17 DIAGNOSIS — K8020 Calculus of gallbladder without cholecystitis without obstruction: Secondary | ICD-10-CM | POA: Diagnosis not present

## 2018-06-17 DIAGNOSIS — R918 Other nonspecific abnormal finding of lung field: Secondary | ICD-10-CM | POA: Diagnosis not present

## 2018-06-17 DIAGNOSIS — C3491 Malignant neoplasm of unspecified part of right bronchus or lung: Secondary | ICD-10-CM | POA: Insufficient documentation

## 2018-06-17 MED ORDER — SODIUM CHLORIDE (PF) 0.9 % IJ SOLN
INTRAMUSCULAR | Status: AC
  Filled 2018-06-17: qty 50

## 2018-06-17 MED ORDER — IOHEXOL 300 MG/ML  SOLN
100.0000 mL | Freq: Once | INTRAMUSCULAR | Status: AC | PRN
  Administered 2018-06-17: 100 mL via INTRAVENOUS

## 2018-06-18 ENCOUNTER — Other Ambulatory Visit: Payer: Self-pay

## 2018-06-18 ENCOUNTER — Encounter: Payer: Self-pay | Admitting: Internal Medicine

## 2018-06-18 ENCOUNTER — Inpatient Hospital Stay: Payer: Medicare HMO

## 2018-06-18 ENCOUNTER — Inpatient Hospital Stay (HOSPITAL_BASED_OUTPATIENT_CLINIC_OR_DEPARTMENT_OTHER): Payer: Medicare HMO | Admitting: Internal Medicine

## 2018-06-18 VITALS — BP 117/80 | HR 98 | Temp 99.0°F | Resp 18 | Ht 67.0 in | Wt 111.8 lb

## 2018-06-18 DIAGNOSIS — C3491 Malignant neoplasm of unspecified part of right bronchus or lung: Secondary | ICD-10-CM

## 2018-06-18 DIAGNOSIS — C784 Secondary malignant neoplasm of small intestine: Secondary | ICD-10-CM | POA: Diagnosis not present

## 2018-06-18 DIAGNOSIS — C3411 Malignant neoplasm of upper lobe, right bronchus or lung: Secondary | ICD-10-CM | POA: Diagnosis not present

## 2018-06-18 DIAGNOSIS — R197 Diarrhea, unspecified: Secondary | ICD-10-CM | POA: Diagnosis not present

## 2018-06-18 DIAGNOSIS — Z79899 Other long term (current) drug therapy: Secondary | ICD-10-CM | POA: Diagnosis not present

## 2018-06-18 DIAGNOSIS — Z95828 Presence of other vascular implants and grafts: Secondary | ICD-10-CM

## 2018-06-18 DIAGNOSIS — E119 Type 2 diabetes mellitus without complications: Secondary | ICD-10-CM

## 2018-06-18 DIAGNOSIS — C171 Malignant neoplasm of jejunum: Secondary | ICD-10-CM

## 2018-06-18 DIAGNOSIS — E1159 Type 2 diabetes mellitus with other circulatory complications: Secondary | ICD-10-CM

## 2018-06-18 DIAGNOSIS — Z5112 Encounter for antineoplastic immunotherapy: Secondary | ICD-10-CM | POA: Diagnosis not present

## 2018-06-18 DIAGNOSIS — C3490 Malignant neoplasm of unspecified part of unspecified bronchus or lung: Secondary | ICD-10-CM

## 2018-06-18 LAB — CBC WITH DIFFERENTIAL (CANCER CENTER ONLY)
Abs Immature Granulocytes: 0.04 10*3/uL (ref 0.00–0.07)
Basophils Absolute: 0 10*3/uL (ref 0.0–0.1)
Basophils Relative: 0 %
Eosinophils Absolute: 0.1 10*3/uL (ref 0.0–0.5)
Eosinophils Relative: 1 %
HCT: 41.5 % (ref 39.0–52.0)
Hemoglobin: 13.6 g/dL (ref 13.0–17.0)
Immature Granulocytes: 1 %
Lymphocytes Relative: 23 %
Lymphs Abs: 2 10*3/uL (ref 0.7–4.0)
MCH: 30.6 pg (ref 26.0–34.0)
MCHC: 32.8 g/dL (ref 30.0–36.0)
MCV: 93.3 fL (ref 80.0–100.0)
Monocytes Absolute: 0.7 10*3/uL (ref 0.1–1.0)
Monocytes Relative: 8 %
Neutro Abs: 6 10*3/uL (ref 1.7–7.7)
Neutrophils Relative %: 67 %
Platelet Count: 232 10*3/uL (ref 150–400)
RBC: 4.45 MIL/uL (ref 4.22–5.81)
RDW: 13 % (ref 11.5–15.5)
WBC Count: 8.7 10*3/uL (ref 4.0–10.5)
nRBC: 0 % (ref 0.0–0.2)

## 2018-06-18 LAB — TSH: TSH: 6.352 u[IU]/mL — ABNORMAL HIGH (ref 0.320–4.118)

## 2018-06-18 LAB — CMP (CANCER CENTER ONLY)
ALT: 54 U/L — ABNORMAL HIGH (ref 0–44)
AST: 48 U/L — ABNORMAL HIGH (ref 15–41)
Albumin: 3.4 g/dL — ABNORMAL LOW (ref 3.5–5.0)
Alkaline Phosphatase: 117 U/L (ref 38–126)
Anion gap: 8 (ref 5–15)
BUN: 7 mg/dL (ref 6–20)
CO2: 27 mmol/L (ref 22–32)
Calcium: 9.1 mg/dL (ref 8.9–10.3)
Chloride: 102 mmol/L (ref 98–111)
Creatinine: 1.03 mg/dL (ref 0.61–1.24)
GFR, Est AFR Am: >60 mL/min (ref >60)
GFR, Estimated: >60 mL/min (ref >60)
Glucose, Bld: 528 mg/dL — ABNORMAL HIGH (ref 70–99)
Potassium: 3.8 mmol/L (ref 3.5–5.1)
Sodium: 137 mmol/L (ref 135–145)
Total Bilirubin: 0.3 mg/dL (ref 0.3–1.2)
Total Protein: 6.2 g/dL — ABNORMAL LOW (ref 6.5–8.1)

## 2018-06-18 MED ORDER — HEPARIN SOD (PORK) LOCK FLUSH 100 UNIT/ML IV SOLN
500.0000 [IU] | Freq: Once | INTRAVENOUS | Status: AC | PRN
  Administered 2018-06-18: 500 [IU]
  Filled 2018-06-18: qty 5

## 2018-06-18 MED ORDER — INSULIN REGULAR HUMAN 100 UNIT/ML IJ SOLN
15.0000 [IU] | Freq: Once | INTRAMUSCULAR | Status: AC
  Administered 2018-06-18: 15 [IU] via SUBCUTANEOUS
  Filled 2018-06-18: qty 10

## 2018-06-18 MED ORDER — SODIUM CHLORIDE 0.9% FLUSH
10.0000 mL | INTRAVENOUS | Status: DC | PRN
  Administered 2018-06-18: 10 mL
  Filled 2018-06-18: qty 10

## 2018-06-18 MED ORDER — SODIUM CHLORIDE 0.9 % IV SOLN
200.0000 mg | Freq: Once | INTRAVENOUS | Status: AC
  Administered 2018-06-18: 200 mg via INTRAVENOUS
  Filled 2018-06-18: qty 8

## 2018-06-18 MED ORDER — SODIUM CHLORIDE 0.9 % IV SOLN
Freq: Once | INTRAVENOUS | Status: AC
  Administered 2018-06-18: 12:00:00 via INTRAVENOUS
  Filled 2018-06-18: qty 250

## 2018-06-18 NOTE — Progress Notes (Signed)
[place] Telephone:([phone]) [phone]   [phone]  OFFICE PROGRESS NOTE  Sid Falcon, MD [place] [address]  DIAGNOSIS: Stage IV (T1b, N0, M1b) non-small cell lung cancer, adenocarcinoma presented with right upper lobe lung nodule and recent metastasis to the small intestine. This was initially diagnosed in September 2017.  Genomic Alterations Identified ERBB2 amplification - equivocal? CDKN2A p16INK4a E88* and p14ARF D638V SMARCA4 splice site [id number]>CZ SPTA1 E2022* TOP2A amplification TP53 A159P Additional Findings Microsatellite status MS-Stable Tumor Mutation Burden TMB-Intermediate; 18 Muts/Mb Additional Disease-relevant Genes with No Reportable Alterations Identified EGFR KRAS ALK BRAF MET RET ROS1   PRIOR THERAPY:  1) Status post right VATS with right upper lobectomy and mediastinal lymph node dissection under the care of Dr. Roxan Hockey on 10/18/2015 and the final pathology was consistent with stage IA (T1b, N0, MX). 2) upper endoscopy on 01/05/2016 showed normal esophagus, normal stomach but there was occasional mass around 3.0 CM in length circumferential nonobstructing in the jejunum. The final pathology was consistent with metastatic adenocarcinoma. 3) status post laparoscopic laparotomy and resection of proximal lesion and and distal jejunum/proximal ileum under the care of Dr. Hassell Done 1 02/27/2016. 3)  Systemic chemotherapy with carboplatin for AUC of 5 and Alimta 500 MG/M2 every 3 weeks. First dose 04/04/2016. Status post 2 cycles. Last dose was given 04/21/2016 discontinued secondary to disease progression.   CURRENT THERAPY: Second line immunotherapy with Ketruda 200 mg IV every 2 weeks, first dose 05/30/2016. Status post 33 cycles.  INTERVAL HISTORY: Brett [name] 58 y.o. male returns to the clinic today for follow-up visit.  The patient is feeling fine today with no concerning complaints except for  intermittent episodes of diarrhea.  He is currently on Imodium.  He denied having any current chest pain, shortness of breath, cough or hemoptysis.  He denied having any fever or chills.  He has no nausea, vomiting, or constipation.  He has no recent weight loss or night sweats.  His blood sugar has been elevated recently and it is much worse with the prednisone premedication for the scan in the last few days.  The patient had repeat CT scan of the chest, abdomen pelvis performed recently and is here for evaluation and discussion of his scan results and treatment options.   MEDICAL HISTORY: Past Medical History:  Diagnosis Date   Barrett's esophagus 07/01/2013   Without dysplasia on biopsy 09/03/2012. Repeat EGD recommended 08/2015   Bilateral cataracts 02/13/2017   Carotid artery stenosis 07/01/2013   Requiring right sided stent    Chronic pain syndrome 07/01/2013   Closed head injury with brief loss of consciousness ([place]) 07/22/2010   Head trapped in a hydraulic device at work.  Fracture of orbital bones on right and brief loss of consciousness per report.   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 There was likely a strong psycho-social overlay in regard to the cognitive deficits in the form of mood disorder with psychotic features and mixed anxiety symptomatology. His primary tested cognitive deficits are in the areas of attention, executi   Daily headache "since 07/2010"   constantly   Degenerative joint disease of cervical spine 07/01/2013   Dupuytren's contracture of both hands 04/08/2014   Encounter for antineoplastic chemotherapy 10/05/2015   Encounter for antineoplastic immunotherapy 05/24/2016   Erectile dysfunction associated with type 2 diabetes mellitus (  [place]) 07/01/2013   Fibromyalgia 07/01/2013   Goals of care, counseling/discussion  03/28/2016   History of blood transfusion 11/28/2015   "suppose to get his first today" (11/28/2015)   Hyperlipidemia LDL goal < 100 07/01/2013   Intractable hiccups 04/11/2016   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   Memory changes    "memory issues" from head injury   Moderate protein-calorie malnutrition ([place]) 11/29/2015   Osteoarthritis of right thumb 10/21/2014   Peripheral vascular occlusive disease ([place]) 07/01/2013   Requiring 2 arterial stents above the left knee per report   Pneumonia ~ 2006/2007   Post traumatic stress disorder 07/01/2013   Primary lung adenocarcinoma ([place]) dx'd 08/2015   "right lung"   Severe major depression with psychotic features ([place]) 04/15/2011   Tobacco abuse 07/01/2013   Type 2 diabetes mellitus with vascular disease ([place]) 07/01/2013   Left lower extremity and right carotid stenting    ALLERGIES:  is allergic to gabapentin; lyrica [pregabalin]; jardiance [empagliflozin]; celebrex [celecoxib]; and contrast media [iodinated diagnostic agents].  MEDICATIONS:  Current Outpatient Medications  Medication Sig Dispense Refill   ACCU-CHEK SOFTCLIX LANCETS lancets Use to test blood glucose 1-2 times daily. Dx Code E11.59 100 each 12   aspirin EC 81 MG tablet Take 81 mg by mouth daily.      atorvastatin (LIPITOR) 40 MG tablet Take 1 tablet (40 mg total) by mouth daily. 90 tablet 3   Blood Glucose Monitoring Suppl (ACCU-CHEK AVIVA PLUS) w/Device KIT Use to test blood glucose 1-2 times daily. Dx Code E11.59 1 kit 0   cyclobenzaprine (FLEXERIL) 10 MG tablet Take 1 tablet (10 mg total) by mouth 3 (three) times daily as needed for muscle spasms. (Patient not taking: Reported on 03/26/2018) 90 tablet 11   diphenhydrAMINE (BENADRYL) 25 mg capsule Take 2 capsules (50 mg total) by mouth as directed. Take prior to CT scan as directed (Patient not taking: Reported on 12/03/2017) 30 capsule 0   glipiZIDE (GLUCOTROL) 10 MG tablet Take 1 tablet (10 mg total) by  mouth 2 (two) times daily before a meal. 180 tablet 3   glucose blood test strip USE TO TEST BLOOD GLUCOSE 1 TO 2 TIMES DAILY 100 each 4   hydrOXYzine (ATARAX/VISTARIL) 10 MG tablet Take 1 tablet (10 mg total) by mouth 3 (three) times daily as needed for itching. (Patient not taking: Reported on 03/26/2018) 30 tablet 0   insulin aspart (NOVOLOG) 100 UNIT/ML FlexPen Inject 5 Units into the skin daily. Just before dinner 3 mL 6   Insulin Glargine (LANTUS) 100 UNIT/ML Solostar Pen Inject 20 Units into the skin daily. 15 mL 3   Insulin Pen Needle (PEN NEEDLES) 32G X 4 MM MISC 1 pen by Does not apply route 2 (two) times daily. Dx Code E11.59 200 each 6   lidocaine-prilocaine (EMLA) cream Apply 1 application topically as needed. 30 g 0   lisinopril (PRINIVIL,ZESTRIL) 5 MG tablet Take 0.5 tablets (2.5 mg total) by mouth daily. 90 tablet 3   metFORMIN (GLUCOPHAGE-XR) 500 MG 24 hr tablet Take 1 tablet (500 mg total) by mouth 2 (two) times daily. 180 tablet 3   oxyCODONE-acetaminophen (PERCOCET) 10-325 MG tablet Take 1-2 tablets by mouth every 6 (six) hours as needed for pain. 240 tablet 0   pantoprazole (PROTONIX) 40 MG tablet TAKE 1 TABLET(40 MG) BY MOUTH DAILY 90 tablet 1   predniSONE (DELTASONE) 50 MG tablet TAKE 1 TABLET BY MOUTH AT 13 HOURS, 7 HOURS, AND 1 HOUR PRIOR TO SCAN 3 tablet  0   Probiotic Product (CULTURELLE IMMUNE DEFENSE) CAPS Take 1 capsule by mouth daily. 30 capsule 11   prochlorperazine (COMPAZINE) 10 MG tablet Take 10 mg by mouth every 6 (six) hours as needed for nausea or vomiting.     sitaGLIPtin (JANUVIA) 100 MG tablet Take 1 tablet (100 mg total) by mouth daily. 90 tablet 3   No current facility-administered medications for this visit.     SURGICAL HISTORY:  Past Surgical History:  Procedure Laterality Date   CAROTID STENT Right ?2014   COLONOSCOPY N/A 11/30/2015   Procedure: COLONOSCOPY;  Surgeon: Teena Irani, MD;  Location: [facility] ENDOSCOPY;  Service: Endoscopy;   Laterality: N/A;   ESOPHAGOGASTRODUODENOSCOPY N/A 11/30/2015   Procedure: ESOPHAGOGASTRODUODENOSCOPY (EGD);  Surgeon: Teena Irani, MD;  Location: [facility] ENDOSCOPY;  Service: Endoscopy;  Laterality: N/A;   ESOPHAGOGASTRODUODENOSCOPY (EGD) WITH PROPOFOL N/A 01/05/2016   Procedure: ESOPHAGOGASTRODUODENOSCOPY (EGD) WITH PROPOFOL;  Surgeon: Teena Irani, MD;  Location: [facility] ENDOSCOPY;  Service: Endoscopy;  Laterality: N/A;   FEMORAL ARTERY STENT Left 05/2012; ~ 2015   Archie Endo 06/04/2012; Raechel Chute report   FRACTURE SURGERY     GIVENS CAPSULE STUDY N/A 12/22/2015   Procedure: GIVENS CAPSULE STUDY;  Surgeon: Wonda Horner, MD;  Location: [facility] ENDOSCOPY;  Service: Endoscopy;  Laterality: N/A;   HARDWARE REMOVAL Right 11/15/2011   Removal of deep frontozygomatic orbital hardware/notes 11/15/2011   HERNIA REPAIR  3832   Umbilical   LAPAROSCOPY N/A 02/27/2016   Procedure: LAPAROSCOPY, LAPAROTOMY  WITH TWO SMALL BOWEL RESECTION;  Surgeon: Johnathan Hausen, MD;  Location: [facility] ORS;  Service: General;  Laterality: N/A;   ORIF ORBITAL FRACTURE Right 08/15/2010    caught in a hydraulic machine; open reduction internal fixation of orbital rim fracture and open reduction of zygomatic arch fracture  /notes 10/13/2009   PORTACATH PLACEMENT Left 04/04/2016   Procedure: INSERTION PORT-A-CATH LEFT CHEST;  Surgeon: Melrose Nakayama, MD;  Location: [place];  Service: Thoracic;  Laterality: Left;   [employer] (VATS)/ LOBECTOMY Right 10/18/2015   Procedure: VIDEO ASSISTED THORACOSCOPY (VATS)/ LOBECTOMY;  Surgeon: Melrose Nakayama, MD;  Location: [place];  Service: Thoracic;  Laterality: Right;   VIDEO BRONCHOSCOPY Bilateral 09/21/2015   Procedure: VIDEO BRONCHOSCOPY WITH FLUORO;  Surgeon: Juanito Doom, MD;  Location: [facility] ENDOSCOPY;  Service: Cardiopulmonary;  Laterality: Bilateral;    REVIEW OF SYSTEMS:  Constitutional: positive for fatigue Eyes: negative Ears, nose, mouth, throat, and face:  negative Respiratory: negative Cardiovascular: negative Gastrointestinal: positive for diarrhea Genitourinary:negative Integument/breast: negative Hematologic/lymphatic: negative Musculoskeletal:negative Neurological: negative Behavioral/Psych: negative Endocrine: negative Allergic/Immunologic: negative   PHYSICAL EXAMINATION: General appearance: alert, cooperative and no distress Head: Normocephalic, without obvious abnormality, atraumatic Neck: no adenopathy, no JVD, supple, symmetrical, trachea midline and thyroid not enlarged, symmetric, no tenderness/mass/nodules Lymph nodes: Cervical, supraclavicular, and axillary nodes normal. Resp: clear to auscultation bilaterally Back: symmetric, no curvature. ROM normal. No CVA tenderness. Cardio: regular rate and rhythm, S1, S2 normal, no murmur, click, rub or gallop GI: soft, non-tender; bowel sounds normal; no masses,  no organomegaly Extremities: extremities normal, atraumatic, no cyanosis or edema Neurologic: Alert and oriented X 3, normal strength and tone. Normal symmetric reflexes. Normal coordination and gait  ECOG PERFORMANCE STATUS: 1 - Symptomatic but completely ambulatory  Blood pressure 117/80, pulse 98, temperature 99 F (37.2 C), temperature source Oral, resp. rate 18, height 5' 7"  (1.702 m), weight 111 lb 12.8 oz (50.7 kg), SpO2 96 %.  LABORATORY DATA: Lab Results  Component Value Date  WBC 8.7 06/18/2018   HGB 13.6 06/18/2018   HCT 41.5 06/18/2018   MCV 93.3 06/18/2018   PLT 232 06/18/2018      Chemistry      Component Value Date/Time   NA 136 05/28/2018 1104   NA 140 01/23/2017 0910   K 3.9 05/28/2018 1104   K 4.5 01/23/2017 0910   CL 105 05/28/2018 1104   CO2 24 05/28/2018 1104   CO2 28 01/23/2017 0910   BUN 5 (L) 05/28/2018 1104   BUN 8.1 01/23/2017 0910   CREATININE 0.77 05/28/2018 1104   CREATININE 0.8 01/23/2017 0910   GLU 398 (H) 08/08/2016 1257      Component Value Date/Time   CALCIUM 7.8  (L) 05/28/2018 1104   CALCIUM 9.6 01/23/2017 0910   ALKPHOS 107 05/28/2018 1104   ALKPHOS 113 01/23/2017 0910   AST 48 (H) 05/28/2018 1104   AST 11 01/23/2017 0910   ALT 56 (H) 05/28/2018 1104   ALT 12 01/23/2017 0910   BILITOT 0.3 05/28/2018 1104   BILITOT 0.40 01/23/2017 0910       RADIOGRAPHIC STUDIES: Ct Chest W Contrast  Result Date: 06/17/2018 CLINICAL DATA:  Stage IV right upper lobe lung adenocarcinoma diagnosed in 2017 status post right upper lobectomy with two jejunal metastases resected 02/27/2016. Ongoing immunotherapy. Restaging. EXAM: CT CHEST, ABDOMEN, AND PELVIS WITH CONTRAST TECHNIQUE: Multidetector CT imaging of the chest, abdomen and pelvis was performed following the standard protocol during bolus administration of intravenous contrast. CONTRAST:  139m OMNIPAQUE IOHEXOL 300 MG/ML  SOLN COMPARISON:  02/19/2018 CT chest, abdomen and pelvis. FINDINGS: CT CHEST FINDINGS Cardiovascular: Normal heart size. No significant pericardial effusion/thickening. Three-vessel coronary atherosclerosis. Left subclavian Port-A-Cath terminates in the middle third of the SVC. Atherosclerotic nonaneurysmal thoracic aorta. Normal caliber pulmonary arteries. No central pulmonary emboli. Mediastinum/Nodes: No discrete thyroid nodules. Unremarkable esophagus. No pathologically enlarged axillary, mediastinal or hilar lymph nodes. Lungs/Pleura: No pneumothorax. No pleural effusion. Status post right upper lobectomy. No acute consolidative airspace disease or lung masses. Two scattered tiny solid pulmonary nodules, largest 2 mm in the left upper lobe (series 6/image 45), stable. No new significant pulmonary nodules. Tiny foci of mucoid impaction in the lower lobes bilaterally. Musculoskeletal: No aggressive appearing focal osseous lesions. Mild thoracic spondylosis. CT ABDOMEN PELVIS FINDINGS Hepatobiliary: Suggestion of diffuse hepatic steatosis. Normal liver size. No liver masses. Cholelithiasis. No  biliary ductal dilatation. Pancreas: Normal, with no mass or duct dilation. Spleen: Normal size. No mass. Adrenals/Urinary Tract: Normal adrenals. Normal kidneys with no hydronephrosis and no renal mass. Normal bladder. Stomach/Bowel: Normal non-distended stomach. Stable postsurgical changes from prior small bowel resection with enteroenterostomies. No unexpected small bowel dilatation. No discrete small bowel mass or small bowel wall thickening. Oral contrast transits to the colon. Normal appendix. Normal large bowel with no diverticulosis, large bowel wall thickening or pericolonic fat stranding. Vascular/Lymphatic: Atherosclerotic nonaneurysmal abdominal aorta. Patent portal, splenic, hepatic and renal veins. Enlarged heterogeneously enhancing 1.6 cm left central mesenteric node (series 2/image 80), increased from 1.3 cm on 02/19/2018 and 0.9 cm 12/02/2017. No additional pathologically enlarged lymph nodes in the abdomen or pelvis. Reproductive: Top-normal size prostate with nonspecific coarse internal prostatic calcification. Other: No pneumoperitoneum, ascites or focal fluid collection. Musculoskeletal: No aggressive appearing focal osseous lesions. Mild lumbar spondylosis. IMPRESSION: 1. Solitary enlarged left central mesenteric lymph node with heterogeneous enhancement, increased in size in the interval, worrisome for recurrent nodal metastasis. 2. No additional sites concerning for metastatic disease. 3.  Aortic Atherosclerosis (ICD10-I70.0). Electronically  Signed   By: Ilona Sorrel M.D.   On: 06/17/2018 21:09   Ct Abdomen Pelvis W Contrast  Result Date: 06/17/2018 CLINICAL DATA:  Stage IV right upper lobe lung adenocarcinoma diagnosed in 2017 status post right upper lobectomy with two jejunal metastases resected 02/27/2016. Ongoing immunotherapy. Restaging. EXAM: CT CHEST, ABDOMEN, AND PELVIS WITH CONTRAST TECHNIQUE: Multidetector CT imaging of the chest, abdomen and pelvis was performed following the  standard protocol during bolus administration of intravenous contrast. CONTRAST:  177m OMNIPAQUE IOHEXOL 300 MG/ML  SOLN COMPARISON:  02/19/2018 CT chest, abdomen and pelvis. FINDINGS: CT CHEST FINDINGS Cardiovascular: Normal heart size. No significant pericardial effusion/thickening. Three-vessel coronary atherosclerosis. Left subclavian Port-A-Cath terminates in the middle third of the SVC. Atherosclerotic nonaneurysmal thoracic aorta. Normal caliber pulmonary arteries. No central pulmonary emboli. Mediastinum/Nodes: No discrete thyroid nodules. Unremarkable esophagus. No pathologically enlarged axillary, mediastinal or hilar lymph nodes. Lungs/Pleura: No pneumothorax. No pleural effusion. Status post right upper lobectomy. No acute consolidative airspace disease or lung masses. Two scattered tiny solid pulmonary nodules, largest 2 mm in the left upper lobe (series 6/image 45), stable. No new significant pulmonary nodules. Tiny foci of mucoid impaction in the lower lobes bilaterally. Musculoskeletal: No aggressive appearing focal osseous lesions. Mild thoracic spondylosis. CT ABDOMEN PELVIS FINDINGS Hepatobiliary: Suggestion of diffuse hepatic steatosis. Normal liver size. No liver masses. Cholelithiasis. No biliary ductal dilatation. Pancreas: Normal, with no mass or duct dilation. Spleen: Normal size. No mass. Adrenals/Urinary Tract: Normal adrenals. Normal kidneys with no hydronephrosis and no renal mass. Normal bladder. Stomach/Bowel: Normal non-distended stomach. Stable postsurgical changes from prior small bowel resection with enteroenterostomies. No unexpected small bowel dilatation. No discrete small bowel mass or small bowel wall thickening. Oral contrast transits to the colon. Normal appendix. Normal large bowel with no diverticulosis, large bowel wall thickening or pericolonic fat stranding. Vascular/Lymphatic: Atherosclerotic nonaneurysmal abdominal aorta. Patent portal, splenic, hepatic and renal  veins. Enlarged heterogeneously enhancing 1.6 cm left central mesenteric node (series 2/image 80), increased from 1.3 cm on 02/19/2018 and 0.9 cm 12/02/2017. No additional pathologically enlarged lymph nodes in the abdomen or pelvis. Reproductive: Top-normal size prostate with nonspecific coarse internal prostatic calcification. Other: No pneumoperitoneum, ascites or focal fluid collection. Musculoskeletal: No aggressive appearing focal osseous lesions. Mild lumbar spondylosis. IMPRESSION: 1. Solitary enlarged left central mesenteric lymph node with heterogeneous enhancement, increased in size in the interval, worrisome for recurrent nodal metastasis. 2. No additional sites concerning for metastatic disease. 3.  Aortic Atherosclerosis (ICD10-I70.0). Electronically Signed   By: JIlona SorrelM.D.   On: 06/17/2018 21:09     ASSESSMENT AND PLAN:  This is a very pleasant 58years old white male with metastatic non-small cell lung cancer, adenocarcinoma status post right upper lobectomy with lymph node dissection. Unfortunately the patient was found to have metastatic disease in the jejunum and terminal ileum.  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 The patient was started on treatment with systemic chemotherapy with carboplatin and Alimta for 2 cycles discontinued secondary to intolerance and disease progression. The patient is currently undergoing treatment with second line immunotherapy with Ketruda (pembrolizumab) 200 mg IV every 3 weeks, status post 33 cycles. The patient has been tolerating this treatment well with no concerning adverse effects. He had repeat CT scan of the chest, abdomen and pelvis performed recently.  His scan showed stable disease except for enlarging solitary left central mesenteric  lymph node that increased by few millimeter since his last scan. I  discussed the scan results with the patient and recommended for him to continue on his current treatment with [facility] and he will proceed with cycle #34 today. For the diarrhea, he will continue on Imodium. For the diabetes mellitus he is followed by Dr. Daryll Drown at the [facility] internal medicine teaching program.  His blood sugar was significantly elevated today at 528.  I will give the patient 15 units of regular insulin in the clinic today. The patient will come back for follow-up visit in 3 weeks for evaluation before starting the next cycle of his treatment. He was advised to call immediately if he has any concerning symptoms in the interval. The patient voices understanding of current disease status and treatment options and is in agreement with the current care plan. All questions were answered. The patient knows to call the clinic with any problems, questions or concerns. We can certainly see the patient much sooner if necessary.  Disclaimer: This note was dictated with voice recognition software. Similar sounding words can inadvertently be transcribed and may not be corrected upon review.

## 2018-06-18 NOTE — Patient Instructions (Signed)
[facility] Discharge Instructions for Patients Receiving Chemotherapy  Today you received the following chemotherapy agents:  Keytruda.  To help prevent nausea and vomiting after your treatment, we encourage you to take your nausea medication as directed.   If you develop nausea and vomiting that is not controlled by your nausea medication, call the clinic.   BELOW ARE SYMPTOMS THAT SHOULD BE REPORTED IMMEDIATELY:  *FEVER GREATER THAN 100.5 F  *CHILLS WITH OR WITHOUT FEVER  NAUSEA AND VOMITING THAT IS NOT CONTROLLED WITH YOUR NAUSEA MEDICATION  *UNUSUAL SHORTNESS OF BREATH  *UNUSUAL BRUISING OR BLEEDING  TENDERNESS IN MOUTH AND THROAT WITH OR WITHOUT PRESENCE OF ULCERS  *URINARY PROBLEMS  *BOWEL PROBLEMS  UNUSUAL RASH Items with * indicate a potential emergency and should be followed up as soon as possible.  Feel free to call the clinic should you have any questions or concerns. The clinic phone number is [phone].  Please show the CHEMO ALERT CARD at check-in to the Emergency Department and triage nurse.    

## 2018-06-22 ENCOUNTER — Other Ambulatory Visit: Payer: Self-pay | Admitting: Physician Assistant

## 2018-06-22 ENCOUNTER — Telehealth: Payer: Self-pay | Admitting: Physician Assistant

## 2018-06-22 DIAGNOSIS — E039 Hypothyroidism, unspecified: Secondary | ICD-10-CM

## 2018-06-22 MED ORDER — LEVOTHYROXINE SODIUM 50 MCG PO TABS: 50.0000 ug | ORAL_TABLET | Freq: Every day | ORAL | 0 refills | Status: DC

## 2018-06-22 NOTE — Telephone Encounter (Signed)
Spoke to the patient's wife about his blood work today. His TSH was noted to be trending upwards on routine labs recently. I am going to send a prescription for synthroid to the patient's pharmacy for 50 mcg. We will continue to monitor his TSH every 3 weeks on routine labs. She expressed understanding and will pick up the prescription.

## 2018-06-26 ENCOUNTER — Other Ambulatory Visit: Payer: Self-pay | Admitting: Internal Medicine

## 2018-06-26 DIAGNOSIS — E039 Hypothyroidism, unspecified: Secondary | ICD-10-CM

## 2018-06-26 DIAGNOSIS — E1159 Type 2 diabetes mellitus with other circulatory complications: Secondary | ICD-10-CM

## 2018-06-26 DIAGNOSIS — G894 Chronic pain syndrome: Secondary | ICD-10-CM

## 2018-06-26 NOTE — Telephone Encounter (Signed)
Needs refill on metFORMIN (GLUCOPHAGE-XR) 500 MG 24 hr tablet oxyCODONE-acetaminophen (PERCOCET) 10-325 MG tablet   ;pt contact [id number] - [place], [place] [address]

## 2018-06-29 MED ORDER — OXYCODONE-ACETAMINOPHEN 10-325 MG PO TABS: 1.0000 | ORAL_TABLET | Freq: Four times a day (QID) | ORAL | 0 refills | Status: DC | PRN

## 2018-06-29 MED ORDER — METFORMIN HCL ER 500 MG PO TB24: 500.0000 mg | ORAL_TABLET | Freq: Two times a day (BID) | ORAL | 3 refills | Status: DC

## 2018-06-29 NOTE — Telephone Encounter (Signed)
Refills sent.  3 months of oxycodone sent.  PDMP reviewed and appropriate.    Gilles Chiquito, MD

## 2018-07-08 ENCOUNTER — Other Ambulatory Visit: Payer: Self-pay | Admitting: *Deleted

## 2018-07-08 DIAGNOSIS — E1159 Type 2 diabetes mellitus with other circulatory complications: Secondary | ICD-10-CM

## 2018-07-08 MED ORDER — LISINOPRIL 5 MG PO TABS: 2.5000 mg | ORAL_TABLET | Freq: Every day | ORAL | 3 refills | Status: DC

## 2018-07-09 ENCOUNTER — Encounter: Payer: Self-pay | Admitting: Internal Medicine

## 2018-07-09 ENCOUNTER — Inpatient Hospital Stay: Payer: Medicare HMO | Attending: Internal Medicine

## 2018-07-09 ENCOUNTER — Inpatient Hospital Stay: Payer: Medicare HMO

## 2018-07-09 ENCOUNTER — Other Ambulatory Visit: Payer: Self-pay

## 2018-07-09 ENCOUNTER — Inpatient Hospital Stay (HOSPITAL_BASED_OUTPATIENT_CLINIC_OR_DEPARTMENT_OTHER): Payer: Medicare HMO | Admitting: Internal Medicine

## 2018-07-09 VITALS — BP 113/79 | HR 100 | Temp 97.8°F | Resp 18 | Ht 67.0 in | Wt 112.2 lb

## 2018-07-09 DIAGNOSIS — E119 Type 2 diabetes mellitus without complications: Secondary | ICD-10-CM

## 2018-07-09 DIAGNOSIS — Z79899 Other long term (current) drug therapy: Secondary | ICD-10-CM | POA: Diagnosis not present

## 2018-07-09 DIAGNOSIS — C3411 Malignant neoplasm of upper lobe, right bronchus or lung: Secondary | ICD-10-CM

## 2018-07-09 DIAGNOSIS — C784 Secondary malignant neoplasm of small intestine: Secondary | ICD-10-CM

## 2018-07-09 DIAGNOSIS — Z5112 Encounter for antineoplastic immunotherapy: Secondary | ICD-10-CM

## 2018-07-09 DIAGNOSIS — C171 Malignant neoplasm of jejunum: Secondary | ICD-10-CM

## 2018-07-09 DIAGNOSIS — R197 Diarrhea, unspecified: Secondary | ICD-10-CM

## 2018-07-09 DIAGNOSIS — E1159 Type 2 diabetes mellitus with other circulatory complications: Secondary | ICD-10-CM

## 2018-07-09 DIAGNOSIS — C3490 Malignant neoplasm of unspecified part of unspecified bronchus or lung: Secondary | ICD-10-CM

## 2018-07-09 DIAGNOSIS — C3491 Malignant neoplasm of unspecified part of right bronchus or lung: Secondary | ICD-10-CM

## 2018-07-09 DIAGNOSIS — Z95828 Presence of other vascular implants and grafts: Secondary | ICD-10-CM

## 2018-07-09 LAB — CBC WITH DIFFERENTIAL (CANCER CENTER ONLY)
Abs Immature Granulocytes: 0.03 10*3/uL (ref 0.00–0.07)
Basophils Absolute: 0.1 10*3/uL (ref 0.0–0.1)
Basophils Relative: 1 %
Eosinophils Absolute: 0.2 10*3/uL (ref 0.0–0.5)
Eosinophils Relative: 3 %
HCT: 44 % (ref 39.0–52.0)
Hemoglobin: 14.4 g/dL (ref 13.0–17.0)
Immature Granulocytes: 1 %
Lymphocytes Relative: 29 %
Lymphs Abs: 1.9 10*3/uL (ref 0.7–4.0)
MCH: 30.4 pg (ref 26.0–34.0)
MCHC: 32.7 g/dL (ref 30.0–36.0)
MCV: 93 fL (ref 80.0–100.0)
Monocytes Absolute: 0.6 10*3/uL (ref 0.1–1.0)
Monocytes Relative: 9 %
Neutro Abs: 3.7 10*3/uL (ref 1.7–7.7)
Neutrophils Relative %: 57 %
Platelet Count: 212 10*3/uL (ref 150–400)
RBC: 4.73 MIL/uL (ref 4.22–5.81)
RDW: 12.7 % (ref 11.5–15.5)
WBC Count: 6.5 10*3/uL (ref 4.0–10.5)
nRBC: 0 % (ref 0.0–0.2)

## 2018-07-09 LAB — CMP (CANCER CENTER ONLY)
ALT: 42 U/L (ref 0–44)
AST: 39 U/L (ref 15–41)
Albumin: 3.3 g/dL — ABNORMAL LOW (ref 3.5–5.0)
Alkaline Phosphatase: 122 U/L (ref 38–126)
Anion gap: 11 (ref 5–15)
BUN: 4 mg/dL — ABNORMAL LOW (ref 6–20)
CO2: 25 mmol/L (ref 22–32)
Calcium: 8.6 mg/dL — ABNORMAL LOW (ref 8.9–10.3)
Chloride: 107 mmol/L (ref 98–111)
Creatinine: 0.77 mg/dL (ref 0.61–1.24)
GFR, Est AFR Am: >60 mL/min (ref >60)
GFR, Estimated: >60 mL/min (ref >60)
Glucose, Bld: 127 mg/dL — ABNORMAL HIGH (ref 70–99)
Potassium: 3.7 mmol/L (ref 3.5–5.1)
Sodium: 143 mmol/L (ref 135–145)
Total Bilirubin: 0.4 mg/dL (ref 0.3–1.2)
Total Protein: 6 g/dL — ABNORMAL LOW (ref 6.5–8.1)

## 2018-07-09 LAB — TSH: TSH: 4.478 u[IU]/mL — ABNORMAL HIGH (ref 0.320–4.118)

## 2018-07-09 MED ORDER — SODIUM CHLORIDE 0.9 % IV SOLN
Freq: Once | INTRAVENOUS | Status: AC
  Administered 2018-07-09: 09:00:00 via INTRAVENOUS
  Filled 2018-07-09: qty 250

## 2018-07-09 MED ORDER — SODIUM CHLORIDE 0.9% FLUSH
10.0000 mL | INTRAVENOUS | Status: DC | PRN
  Administered 2018-07-09: 10 mL
  Filled 2018-07-09: qty 10

## 2018-07-09 MED ORDER — HEPARIN SOD (PORK) LOCK FLUSH 100 UNIT/ML IV SOLN
500.0000 [IU] | Freq: Once | INTRAVENOUS | Status: AC | PRN
  Administered 2018-07-09: 500 [IU]
  Filled 2018-07-09: qty 5

## 2018-07-09 MED ORDER — SODIUM CHLORIDE 0.9 % IV SOLN
200.0000 mg | Freq: Once | INTRAVENOUS | Status: AC
  Administered 2018-07-09: 200 mg via INTRAVENOUS
  Filled 2018-07-09: qty 8

## 2018-07-09 NOTE — Progress Notes (Signed)
°    [facility] °[phone]   [phone] ° °OFFICE PROGRESS NOTE ° °[name], [name], MD °[address] °[place] [address] ° °DIAGNOSIS: Stage IV (T1b, N0, M1b) non-small cell lung cancer, adenocarcinoma presented with right upper lobe lung nodule and recent metastasis to the small intestine. This was initially diagnosed in September 2017. ° °Genomic Alterations Identified† °ERBB2 amplification - equivocal? °CDKN2A p16INK4a E88* and p14ARF G102V °SMARCA4 splice site [id number]>TT °SPTA1 E2022* °TOP2A amplification °TP53 A159P °Additional Findings† °Microsatellite status MS-Stable °Tumor Mutation Burden TMB-Intermediate; 18 Muts/Mb °Additional Disease-relevant Genes with No °Reportable Alterations Identified† °EGFR °KRAS °ALK °BRAF °MET °RET °ROS1  ° °PRIOR THERAPY:  °1) Status post right VATS with right upper lobectomy and mediastinal lymph node dissection under the care of Dr. Hendrickson on 10/18/2015 and the final pathology was consistent with stage IA (T1b, N0, MX). °2) upper endoscopy on 01/05/2016 showed normal esophagus, normal stomach but there was occasional mass around 3.0 CM in length circumferential nonobstructing in the jejunum. The final pathology was consistent with metastatic adenocarcinoma. °3) status post laparoscopic laparotomy and resection of proximal lesion and and distal jejunum/proximal ileum under the care of Dr. Martin 1 02/27/2016. °3)  Systemic chemotherapy with carboplatin for AUC of 5 and Alimta 500 MG/M2 every 3 weeks. First dose 04/04/2016. Status post 2 cycles. Last dose was given 04/21/2016 discontinued secondary to disease progression. ° ° °CURRENT THERAPY: Second line immunotherapy with Ketruda 200 mg IV every 2 weeks, first dose 05/30/2016. Status post 34 cycles. ° °INTERVAL HISTORY: °Brett [name] 58 y.o. male returns to the clinic today for follow-up visit.  The patient is feeling fine today with no concerning complaints except for few  episodes of diarrhea.  This improved when he started taking Imodium.  He denied having any nausea, vomiting or constipation.  He denied having any headache or visual changes.  He has no chest pain, shortness of breath, cough or hemoptysis.  He has no fever or chills.  The patient is here today for evaluation before starting cycle #35 of his treatment. ° °MEDICAL HISTORY: °Past Medical History:  °Diagnosis Date  °• Barrett's esophagus 07/01/2013  ° Without dysplasia on biopsy 09/03/2012. Repeat EGD recommended 08/2015  °• Bilateral cataracts 02/13/2017  °• Carotid artery stenosis 07/01/2013  ° Requiring right sided stent   °• Chronic pain syndrome 07/01/2013  °• Closed head injury with brief loss of consciousness (HCC) 07/22/2010  ° Head trapped in a hydraulic device at work.  Fracture of orbital bones on right and brief loss of consciousness per report.  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 There was likely a strong psycho-social overlay in regard to the cognitive deficits in the form of mood disorder with psychotic features and mixed anxiety symptomatology. His primary tested cognitive deficits are in the areas of attention, executi  °• Daily headache "since 07/2010"  ° constantly  °• Degenerative joint disease of cervical spine 07/01/2013  °• Dupuytren's contracture of both hands 04/08/2014  °• Encounter for antineoplastic chemotherapy 10/05/2015  °• Encounter for antineoplastic immunotherapy 05/24/2016  °• Erectile dysfunction associated with type 2 diabetes mellitus (HCC) 07/01/2013  °• Fibromyalgia 07/01/2013  °• Goals of care, counseling/discussion 03/28/2016  °• History of blood transfusion 11/28/2015  ° "suppose to get his first today" (11/28/2015)  °• Hyperlipidemia LDL goal < 100 07/01/2013  °• Intractable   Intractable hiccups 04/11/2016   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    Memory changes    "memory issues" from head injury   Moderate protein-calorie malnutrition ([place]) 11/29/2015   Osteoarthritis of right thumb 10/21/2014   Peripheral vascular occlusive disease ([place]) 07/01/2013   Requiring 2 arterial stents above the left knee per report   Pneumonia ~ 2006/2007   Post traumatic stress disorder 07/01/2013   Primary lung adenocarcinoma ([place]) dx'd 08/2015   "right lung"   Severe major depression with psychotic features ([place]) 04/15/2011   Tobacco abuse 07/01/2013   Type 2 diabetes mellitus with vascular disease ([place]) 07/01/2013   Left lower extremity and right carotid stenting    ALLERGIES:  is allergic to gabapentin; lyrica [pregabalin]; jardiance [empagliflozin]; celebrex [celecoxib]; and contrast media [iodinated diagnostic agents].  MEDICATIONS:  Current Outpatient Medications  Medication Sig Dispense Refill   ACCU-CHEK SOFTCLIX LANCETS lancets Use to test blood glucose 1-2 times daily. Dx Code E11.59 100 each 12   aspirin EC 81 MG tablet Take 81 mg by mouth daily.      atorvastatin (LIPITOR) 40 MG tablet Take 1 tablet (40 mg total) by mouth daily. 90 tablet 3   Blood Glucose Monitoring Suppl (ACCU-CHEK AVIVA PLUS) w/Device KIT Use to test blood glucose 1-2 times daily. Dx Code E11.59 1 kit 0   cyclobenzaprine (FLEXERIL) 10 MG tablet Take 1 tablet (10 mg total) by mouth 3 (three) times daily as needed for muscle spasms. (Patient not taking: Reported on 03/26/2018) 90 tablet 11   diphenhydrAMINE (BENADRYL) 25 mg capsule Take 2 capsules (50 mg total) by mouth as directed. Take prior to CT scan as directed (Patient not taking: Reported on 12/03/2017) 30 capsule 0   glipiZIDE (GLUCOTROL) 10 MG tablet Take 1 tablet (10 mg total) by mouth 2 (two) times daily before a meal. 180 tablet 3   glucose blood test strip USE TO TEST BLOOD GLUCOSE 1 TO 2 TIMES DAILY 100 each 4   hydrOXYzine (ATARAX/VISTARIL) 10 MG tablet Take 1 tablet (10 mg total) by mouth 3  (three) times daily as needed for itching. (Patient not taking: Reported on 03/26/2018) 30 tablet 0   insulin aspart (NOVOLOG) 100 UNIT/ML FlexPen Inject 5 Units into the skin daily. Just before dinner 3 mL 6   Insulin Glargine (LANTUS) 100 UNIT/ML Solostar Pen Inject 20 Units into the skin daily. 15 mL 3   Insulin Pen Needle (PEN NEEDLES) 32G X 4 MM MISC 1 pen by Does not apply route 2 (two) times daily. Dx Code E11.59 200 each 6   levothyroxine (SYNTHROID) 50 MCG tablet Take 1 tablet (50 mcg total) by mouth daily before breakfast. 30 tablet 0   lidocaine-prilocaine (EMLA) cream Apply 1 application topically as needed. 30 g 0   lisinopril (ZESTRIL) 5 MG tablet Take 0.5 tablets (2.5 mg total) by mouth daily. 90 tablet 3   metFORMIN (GLUCOPHAGE-XR) 500 MG 24 hr tablet Take 1 tablet (500 mg total) by mouth 2 (two) times daily. 180 tablet 3   oxyCODONE-acetaminophen (PERCOCET) 10-325 MG tablet Take 1-2 tablets by mouth every 6 (six) hours as needed for pain. 240 tablet 0   pantoprazole (PROTONIX) 40 MG tablet TAKE 1 TABLET(40 MG) BY MOUTH DAILY 90 tablet 1   predniSONE (DELTASONE) 50 MG tablet TAKE 1 TABLET BY MOUTH AT 13 HOURS, 7 HOURS, AND 1 HOUR PRIOR TO SCAN 3 tablet 0   Probiotic Product (CULTURELLE IMMUNE DEFENSE) CAPS Take 1 capsule by mouth daily. 30 capsule 11  prochlorperazine (COMPAZINE) 10 MG tablet Take 10 mg by mouth every 6 (six) hours as needed for nausea or vomiting.     sitaGLIPtin (JANUVIA) 100 MG tablet Take 1 tablet (100 mg total) by mouth daily. 90 tablet 3   No current facility-administered medications for this visit.    Facility-Administered Medications Ordered in Other Visits  Medication Dose Route Frequency Provider Last Rate Last Dose   sodium chloride flush (NS) 0.9 % injection 10 mL  10 mL Intracatheter PRN Curt Bears, MD   10 mL at 06/18/18 1333    SURGICAL HISTORY:  Past Surgical History:  Procedure Laterality Date   CAROTID STENT Right ?2014     COLONOSCOPY N/A 11/30/2015   Procedure: COLONOSCOPY;  Surgeon: Teena Irani, MD;  Location: [facility] ENDOSCOPY;  Service: Endoscopy;  Laterality: N/A;   ESOPHAGOGASTRODUODENOSCOPY N/A 11/30/2015   Procedure: ESOPHAGOGASTRODUODENOSCOPY (EGD);  Surgeon: Teena Irani, MD;  Location: [facility] ENDOSCOPY;  Service: Endoscopy;  Laterality: N/A;   ESOPHAGOGASTRODUODENOSCOPY (EGD) WITH PROPOFOL N/A 01/05/2016   Procedure: ESOPHAGOGASTRODUODENOSCOPY (EGD) WITH PROPOFOL;  Surgeon: Teena Irani, MD;  Location: [facility] ENDOSCOPY;  Service: Endoscopy;  Laterality: N/A;   FEMORAL ARTERY STENT Left 05/2012; ~ 2015   Archie Endo 06/04/2012; Raechel Chute report   FRACTURE SURGERY     GIVENS CAPSULE STUDY N/A 12/22/2015   Procedure: GIVENS CAPSULE STUDY;  Surgeon: Wonda Horner, MD;  Location: [facility] ENDOSCOPY;  Service: Endoscopy;  Laterality: N/A;   HARDWARE REMOVAL Right 11/15/2011   Removal of deep frontozygomatic orbital hardware/notes 11/15/2011   HERNIA REPAIR  [id number]   Umbilical   LAPAROSCOPY N/A 02/27/2016   Procedure: LAPAROSCOPY, LAPAROTOMY  WITH TWO SMALL BOWEL RESECTION;  Surgeon: Johnathan Hausen, MD;  Location: [facility] ORS;  Service: General;  Laterality: N/A;   ORIF ORBITAL FRACTURE Right 08/15/2010    caught in a hydraulic machine; open reduction internal fixation of orbital rim fracture and open reduction of zygomatic arch fracture  /notes 10/13/2009   PORTACATH PLACEMENT Left 04/04/2016   Procedure: INSERTION PORT-A-CATH LEFT CHEST;  Surgeon: Melrose Nakayama, MD;  Location: [place];  Service: Thoracic;  Laterality: Left;   [place] (VATS)/ LOBECTOMY Right 10/18/2015   Procedure: VIDEO ASSISTED THORACOSCOPY (VATS)/ LOBECTOMY;  Surgeon: Melrose Nakayama, MD;  Location: [facility];  Service: Thoracic;  Laterality: Right;   VIDEO BRONCHOSCOPY Bilateral 09/21/2015   Procedure: VIDEO BRONCHOSCOPY WITH FLUORO;  Surgeon: Juanito Doom, MD;  Location: [facility] ENDOSCOPY;  Service: Cardiopulmonary;  Laterality: Bilateral;     REVIEW OF SYSTEMS:  A comprehensive review of systems was negative except for: Gastrointestinal: positive for diarrhea   PHYSICAL EXAMINATION: General appearance: alert, cooperative and no distress Head: Normocephalic, without obvious abnormality, atraumatic Neck: no adenopathy, no JVD, supple, symmetrical, trachea midline and thyroid not enlarged, symmetric, no tenderness/mass/nodules Lymph nodes: Cervical, supraclavicular, and axillary nodes normal. Resp: clear to auscultation bilaterally Back: symmetric, no curvature. ROM normal. No CVA tenderness. Cardio: regular rate and rhythm, S1, S2 normal, no murmur, click, rub or gallop GI: soft, non-tender; bowel sounds normal; no masses,  no organomegaly Extremities: extremities normal, atraumatic, no cyanosis or edema  ECOG PERFORMANCE STATUS: 1 - Symptomatic but completely ambulatory  Blood pressure 113/79, pulse 100, temperature 97.8 F (36.6 C), temperature source Oral, resp. rate 18, height 5' 7" (1.702 m), weight 112 lb 3.2 oz (50.9 kg), SpO2 100 %.  LABORATORY DATA: Lab Results  Component Value Date   WBC 6.5 07/09/2018   HGB 14.4 07/09/2018   HCT 44.0 07/09/2018  MCV 93.0 07/09/2018  ° PLT 212 07/09/2018  ° ° °  Chemistry   °   °Component Value Date/Time  ° NA 137 06/18/2018 1015  ° NA 140 01/23/2017 0910  ° K 3.8 06/18/2018 1015  ° K 4.5 01/23/2017 0910  ° CL 102 06/18/2018 1015  ° CO2 27 06/18/2018 1015  ° CO2 28 01/23/2017 0910  ° BUN 7 06/18/2018 1015  ° BUN 8.1 01/23/2017 0910  ° CREATININE 1.03 06/18/2018 1015  ° CREATININE 0.8 01/23/2017 0910  ° GLU 398 (H) 08/08/2016 1257  °    °Component Value Date/Time  ° CALCIUM 9.1 06/18/2018 1015  ° CALCIUM 9.6 01/23/2017 0910  ° ALKPHOS 117 06/18/2018 1015  ° ALKPHOS 113 01/23/2017 0910  ° AST 48 (H) 06/18/2018 1015  ° AST 11 01/23/2017 0910  ° ALT 54 (H) 06/18/2018 1015  ° ALT 12 01/23/2017 0910  ° BILITOT 0.3 06/18/2018 1015  ° BILITOT 0.40 01/23/2017 0910  °  ° ° ° °RADIOGRAPHIC  STUDIES: °Ct Chest W Contrast ° °Result Date: 06/17/2018 °CLINICAL DATA:  Stage IV right upper lobe lung adenocarcinoma diagnosed in 2017 status post right upper lobectomy with two jejunal metastases resected 02/27/2016. Ongoing immunotherapy. Restaging. EXAM: CT CHEST, ABDOMEN, AND PELVIS WITH CONTRAST TECHNIQUE: Multidetector CT imaging of the chest, abdomen and pelvis was performed following the standard protocol during bolus administration of intravenous contrast. CONTRAST:  100mL OMNIPAQUE IOHEXOL 300 MG/ML  SOLN COMPARISON:  02/19/2018 CT chest, abdomen and pelvis. FINDINGS: CT CHEST FINDINGS Cardiovascular: Normal heart size. No significant pericardial effusion/thickening. Three-vessel coronary atherosclerosis. Left subclavian Port-A-Cath terminates in the middle third of the SVC. Atherosclerotic nonaneurysmal thoracic aorta. Normal caliber pulmonary arteries. No central pulmonary emboli. Mediastinum/Nodes: No discrete thyroid nodules. Unremarkable esophagus. No pathologically enlarged axillary, mediastinal or hilar lymph nodes. Lungs/Pleura: No pneumothorax. No pleural effusion. Status post right upper lobectomy. No acute consolidative airspace disease or lung masses. Two scattered tiny solid pulmonary nodules, largest 2 mm in the left upper lobe (series 6/image 45), stable. No new significant pulmonary nodules. Tiny foci of mucoid impaction in the lower lobes bilaterally. Musculoskeletal: No aggressive appearing focal osseous lesions. Mild thoracic spondylosis. CT ABDOMEN PELVIS FINDINGS Hepatobiliary: Suggestion of diffuse hepatic steatosis. Normal liver size. No liver masses. Cholelithiasis. No biliary ductal dilatation. Pancreas: Normal, with no mass or duct dilation. Spleen: Normal size. No mass. Adrenals/Urinary Tract: Normal adrenals. Normal kidneys with no hydronephrosis and no renal mass. Normal bladder. Stomach/Bowel: Normal non-distended stomach. Stable postsurgical changes from prior small bowel  resection with enteroenterostomies. No unexpected small bowel dilatation. No discrete small bowel mass or small bowel wall thickening. Oral contrast transits to the colon. Normal appendix. Normal large bowel with no diverticulosis, large bowel wall thickening or pericolonic fat stranding. Vascular/Lymphatic: Atherosclerotic nonaneurysmal abdominal aorta. Patent portal, splenic, hepatic and renal veins. Enlarged heterogeneously enhancing 1.6 cm left central mesenteric node (series 2/image 80), increased from 1.3 cm on 02/19/2018 and 0.9 cm 12/02/2017. No additional pathologically enlarged lymph nodes in the abdomen or pelvis. Reproductive: Top-normal size prostate with nonspecific coarse internal prostatic calcification. Other: No pneumoperitoneum, ascites or focal fluid collection. Musculoskeletal: No aggressive appearing focal osseous lesions. Mild lumbar spondylosis. IMPRESSION: 1. Solitary enlarged left central mesenteric lymph node with heterogeneous enhancement, increased in size in the interval, worrisome for recurrent nodal metastasis. 2. No additional sites concerning for metastatic disease. 3.  Aortic Atherosclerosis (ICD10-I70.0). Electronically Signed   By: Jason A Poff M.D.   On: 06/17/2018 21:09  ° °Ct Abdomen   Pelvis W Contrast  Result Date: 06/17/2018 CLINICAL DATA:  Stage IV right upper lobe lung adenocarcinoma diagnosed in 2017 status post right upper lobectomy with two jejunal metastases resected 02/27/2016. Ongoing immunotherapy. Restaging. EXAM: CT CHEST, ABDOMEN, AND PELVIS WITH CONTRAST TECHNIQUE: Multidetector CT imaging of the chest, abdomen and pelvis was performed following the standard protocol during bolus administration of intravenous contrast. CONTRAST:  137m OMNIPAQUE IOHEXOL 300 MG/ML  SOLN COMPARISON:  02/19/2018 CT chest, abdomen and pelvis. FINDINGS: CT CHEST FINDINGS Cardiovascular: Normal heart size. No significant pericardial effusion/thickening. Three-vessel coronary  atherosclerosis. Left subclavian Port-A-Cath terminates in the middle third of the SVC. Atherosclerotic nonaneurysmal thoracic aorta. Normal caliber pulmonary arteries. No central pulmonary emboli. Mediastinum/Nodes: No discrete thyroid nodules. Unremarkable esophagus. No pathologically enlarged axillary, mediastinal or hilar lymph nodes. Lungs/Pleura: No pneumothorax. No pleural effusion. Status post right upper lobectomy. No acute consolidative airspace disease or lung masses. Two scattered tiny solid pulmonary nodules, largest 2 mm in the left upper lobe (series 6/image 45), stable. No new significant pulmonary nodules. Tiny foci of mucoid impaction in the lower lobes bilaterally. Musculoskeletal: No aggressive appearing focal osseous lesions. Mild thoracic spondylosis. CT ABDOMEN PELVIS FINDINGS Hepatobiliary: Suggestion of diffuse hepatic steatosis. Normal liver size. No liver masses. Cholelithiasis. No biliary ductal dilatation. Pancreas: Normal, with no mass or duct dilation. Spleen: Normal size. No mass. Adrenals/Urinary Tract: Normal adrenals. Normal kidneys with no hydronephrosis and no renal mass. Normal bladder. Stomach/Bowel: Normal non-distended stomach. Stable postsurgical changes from prior small bowel resection with enteroenterostomies. No unexpected small bowel dilatation. No discrete small bowel mass or small bowel wall thickening. Oral contrast transits to the colon. Normal appendix. Normal large bowel with no diverticulosis, large bowel wall thickening or pericolonic fat stranding. Vascular/Lymphatic: Atherosclerotic nonaneurysmal abdominal aorta. Patent portal, splenic, hepatic and renal veins. Enlarged heterogeneously enhancing 1.6 cm left central mesenteric node (series 2/image 80), increased from 1.3 cm on 02/19/2018 and 0.9 cm 12/02/2017. No additional pathologically enlarged lymph nodes in the abdomen or pelvis. Reproductive: Top-normal size prostate with nonspecific coarse internal  prostatic calcification. Other: No pneumoperitoneum, ascites or focal fluid collection. Musculoskeletal: No aggressive appearing focal osseous lesions. Mild lumbar spondylosis. IMPRESSION: 1. Solitary enlarged left central mesenteric lymph node with heterogeneous enhancement, increased in size in the interval, worrisome for recurrent nodal metastasis. 2. No additional sites concerning for metastatic disease. 3.  Aortic Atherosclerosis (ICD10-I70.0). Electronically Signed   By: JIlona SorrelM.D.   On: 06/17/2018 21:09     ASSESSMENT AND PLAN:  This is a very pleasant 58years old white male with metastatic non-small cell lung cancer, adenocarcinoma status post right upper lobectomy with lymph node dissection. Unfortunately the patient was found to have metastatic disease in the jejunum and terminal ileum.  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 The patient was started on treatment with systemic chemotherapy with carboplatin and Alimta for 2 cycles discontinued secondary to intolerance and disease progression. The patient is currently undergoing treatment with second line immunotherapy with Ketruda (pembrolizumab) 200 mg IV every 3 weeks, status post 34 cycles. The patient has been tolerating this treatment well with no concerning adverse effects. I recommended for him to proceed with cycle #35 today as planned. For the diarrhea, he will continue on Imodium. For the diabetes mellitus he [facility] We will see him back  for follow-up visit in 3 weeks for evaluation before the next cycle of his treatment. °The patient was advised to call immediately if he has any concerning symptoms in the interval. °The patient voices understanding of current disease status and treatment options and is in agreement  with the current care plan. °All questions were answered. The patient knows to call the clinic with any problems, questions or concerns. We can certainly see the patient much sooner if necessary. ° °Disclaimer: This note was dictated with voice recognition software. Similar sounding words can inadvertently be transcribed and may not be corrected upon review. ° ° °  °  °

## 2018-07-09 NOTE — Patient Instructions (Signed)
[facility] Discharge Instructions for Patients Receiving Chemotherapy  Today you received the following chemotherapy agents:  Keytruda.  To help prevent nausea and vomiting after your treatment, we encourage you to take your nausea medication as directed.   If you develop nausea and vomiting that is not controlled by your nausea medication, call the clinic.   BELOW ARE SYMPTOMS THAT SHOULD BE REPORTED IMMEDIATELY:  *FEVER GREATER THAN 100.5 F  *CHILLS WITH OR WITHOUT FEVER  NAUSEA AND VOMITING THAT IS NOT CONTROLLED WITH YOUR NAUSEA MEDICATION  *UNUSUAL SHORTNESS OF BREATH  *UNUSUAL BRUISING OR BLEEDING  TENDERNESS IN MOUTH AND THROAT WITH OR WITHOUT PRESENCE OF ULCERS  *URINARY PROBLEMS  *BOWEL PROBLEMS  UNUSUAL RASH Items with * indicate a potential emergency and should be followed up as soon as possible.  Feel free to call the clinic should you have any questions or concerns. The clinic phone number is [phone].  Please show the CHEMO ALERT CARD at check-in to the Emergency Department and triage nurse.    

## 2018-07-13 ENCOUNTER — Telehealth: Payer: Self-pay | Admitting: Internal Medicine

## 2018-07-13 NOTE — Telephone Encounter (Signed)
Scheduled per los. Mailed printout  °

## 2018-07-30 ENCOUNTER — Inpatient Hospital Stay (HOSPITAL_BASED_OUTPATIENT_CLINIC_OR_DEPARTMENT_OTHER): Payer: Medicare HMO | Admitting: Internal Medicine

## 2018-07-30 ENCOUNTER — Encounter: Payer: Self-pay | Admitting: Internal Medicine

## 2018-07-30 ENCOUNTER — Inpatient Hospital Stay: Payer: Medicare HMO | Attending: Internal Medicine

## 2018-07-30 ENCOUNTER — Other Ambulatory Visit: Payer: Self-pay

## 2018-07-30 ENCOUNTER — Other Ambulatory Visit: Payer: Self-pay | Admitting: Physician Assistant

## 2018-07-30 ENCOUNTER — Inpatient Hospital Stay: Payer: Medicare HMO

## 2018-07-30 VITALS — HR 104

## 2018-07-30 VITALS — BP 96/82 | HR 110 | Temp 99.0°F | Resp 18 | Ht 67.0 in | Wt 109.6 lb

## 2018-07-30 DIAGNOSIS — R197 Diarrhea, unspecified: Secondary | ICD-10-CM | POA: Diagnosis not present

## 2018-07-30 DIAGNOSIS — C3490 Malignant neoplasm of unspecified part of unspecified bronchus or lung: Secondary | ICD-10-CM

## 2018-07-30 DIAGNOSIS — C171 Malignant neoplasm of jejunum: Secondary | ICD-10-CM

## 2018-07-30 DIAGNOSIS — E039 Hypothyroidism, unspecified: Secondary | ICD-10-CM

## 2018-07-30 DIAGNOSIS — R634 Abnormal weight loss: Secondary | ICD-10-CM | POA: Diagnosis not present

## 2018-07-30 DIAGNOSIS — Z79899 Other long term (current) drug therapy: Secondary | ICD-10-CM | POA: Diagnosis not present

## 2018-07-30 DIAGNOSIS — C3411 Malignant neoplasm of upper lobe, right bronchus or lung: Secondary | ICD-10-CM

## 2018-07-30 DIAGNOSIS — C3491 Malignant neoplasm of unspecified part of right bronchus or lung: Secondary | ICD-10-CM

## 2018-07-30 DIAGNOSIS — E119 Type 2 diabetes mellitus without complications: Secondary | ICD-10-CM

## 2018-07-30 DIAGNOSIS — Z95828 Presence of other vascular implants and grafts: Secondary | ICD-10-CM

## 2018-07-30 DIAGNOSIS — Z5112 Encounter for antineoplastic immunotherapy: Secondary | ICD-10-CM | POA: Insufficient documentation

## 2018-07-30 DIAGNOSIS — C784 Secondary malignant neoplasm of small intestine: Secondary | ICD-10-CM | POA: Diagnosis not present

## 2018-07-30 DIAGNOSIS — E440 Moderate protein-calorie malnutrition: Secondary | ICD-10-CM

## 2018-07-30 LAB — CBC WITH DIFFERENTIAL (CANCER CENTER ONLY)
Abs Immature Granulocytes: 0.03 10*3/uL (ref 0.00–0.07)
Basophils Absolute: 0 10*3/uL (ref 0.0–0.1)
Basophils Relative: 1 %
Eosinophils Absolute: 0.1 10*3/uL (ref 0.0–0.5)
Eosinophils Relative: 2 %
HCT: 44.1 % (ref 39.0–52.0)
Hemoglobin: 14.4 g/dL (ref 13.0–17.0)
Immature Granulocytes: 0 %
Lymphocytes Relative: 19 %
Lymphs Abs: 1.3 10*3/uL (ref 0.7–4.0)
MCH: 30.3 pg (ref 26.0–34.0)
MCHC: 32.7 g/dL (ref 30.0–36.0)
MCV: 92.8 fL (ref 80.0–100.0)
Monocytes Absolute: 0.7 10*3/uL (ref 0.1–1.0)
Monocytes Relative: 10 %
Neutro Abs: 4.6 10*3/uL (ref 1.7–7.7)
Neutrophils Relative %: 68 %
Platelet Count: 210 10*3/uL (ref 150–400)
RBC: 4.75 MIL/uL (ref 4.22–5.81)
RDW: 12.8 % (ref 11.5–15.5)
WBC Count: 6.8 10*3/uL (ref 4.0–10.5)
nRBC: 0 % (ref 0.0–0.2)

## 2018-07-30 LAB — CMP (CANCER CENTER ONLY)
ALT: 45 U/L — ABNORMAL HIGH (ref 0–44)
AST: 45 U/L — ABNORMAL HIGH (ref 15–41)
Albumin: 3.2 g/dL — ABNORMAL LOW (ref 3.5–5.0)
Alkaline Phosphatase: 138 U/L — ABNORMAL HIGH (ref 38–126)
Anion gap: 10 (ref 5–15)
BUN: 6 mg/dL (ref 6–20)
CO2: 25 mmol/L (ref 22–32)
Calcium: 8.5 mg/dL — ABNORMAL LOW (ref 8.9–10.3)
Chloride: 105 mmol/L (ref 98–111)
Creatinine: 0.75 mg/dL (ref 0.61–1.24)
GFR, Est AFR Am: >60 mL/min (ref >60)
GFR, Estimated: >60 mL/min (ref >60)
Glucose, Bld: 158 mg/dL — ABNORMAL HIGH (ref 70–99)
Potassium: 4.3 mmol/L (ref 3.5–5.1)
Sodium: 140 mmol/L (ref 135–145)
Total Bilirubin: 0.5 mg/dL (ref 0.3–1.2)
Total Protein: 6 g/dL — ABNORMAL LOW (ref 6.5–8.1)

## 2018-07-30 LAB — TSH: TSH: 3.689 u[IU]/mL (ref 0.320–4.118)

## 2018-07-30 MED ORDER — SODIUM CHLORIDE 0.9 % IV SOLN
Freq: Once | INTRAVENOUS | Status: AC
  Administered 2018-07-30: 13:00:00 via INTRAVENOUS
  Filled 2018-07-30: qty 250

## 2018-07-30 MED ORDER — LEVOTHYROXINE SODIUM 50 MCG PO TABS: 50.0000 ug | ORAL_TABLET | Freq: Every day | ORAL | 2 refills | Status: DC

## 2018-07-30 MED ORDER — LEVOTHYROXINE SODIUM 50 MCG PO TABS: 50.0000 ug | ORAL_TABLET | Freq: Every day | ORAL | 0 refills | Status: DC

## 2018-07-30 MED ORDER — HEPARIN SOD (PORK) LOCK FLUSH 100 UNIT/ML IV SOLN
500.0000 [IU] | Freq: Once | INTRAVENOUS | Status: AC | PRN
  Administered 2018-07-30: 500 [IU]
  Filled 2018-07-30: qty 5

## 2018-07-30 MED ORDER — SODIUM CHLORIDE 0.9% FLUSH
10.0000 mL | INTRAVENOUS | Status: DC | PRN
  Administered 2018-07-30: 10 mL
  Filled 2018-07-30: qty 10

## 2018-07-30 MED ORDER — SODIUM CHLORIDE 0.9 % IV SOLN
200.0000 mg | Freq: Once | INTRAVENOUS | Status: AC
  Administered 2018-07-30: 14:00:00 200 mg via INTRAVENOUS
  Filled 2018-07-30: qty 8

## 2018-07-30 NOTE — Patient Instructions (Signed)
[facility] Discharge Instructions for Patients Receiving Chemotherapy  Today you received the following Immunotherapy: Keytruda  To help prevent nausea and vomiting after your treatment, we encourage you to take your nausea medication as directed by your MD.   If you develop nausea and vomiting that is not controlled by your nausea medication, call the clinic.   BELOW ARE SYMPTOMS THAT SHOULD BE REPORTED IMMEDIATELY:  *FEVER GREATER THAN 100.5 F  *CHILLS WITH OR WITHOUT FEVER  NAUSEA AND VOMITING THAT IS NOT CONTROLLED WITH YOUR NAUSEA MEDICATION  *UNUSUAL SHORTNESS OF BREATH  *UNUSUAL BRUISING OR BLEEDING  TENDERNESS IN MOUTH AND THROAT WITH OR WITHOUT PRESENCE OF ULCERS  *URINARY PROBLEMS  *BOWEL PROBLEMS  UNUSUAL RASH Items with * indicate a potential emergency and should be followed up as soon as possible.  Feel free to call the clinic should you have any questions or concerns. The clinic phone number is [phone].  Please show the [facility] at check-in to the Emergency Department and triage nurse.  Coronavirus (COVID-19) Are you at risk?  Are you at risk for the Coronavirus (COVID-19)?  To be considered HIGH RISK for Coronavirus (COVID-19), you have to meet the following criteria:  . Traveled to [place], [place], [place], [place] or [place]; or in the [place] to [place], [place], [place], or [place]; and have fever, cough, and shortness of breath within the last 2 weeks of travel OR . Been in close contact with a person diagnosed with COVID-19 within the last 2 weeks and have fever, cough, and shortness of breath . IF YOU DO NOT MEET THESE CRITERIA, YOU ARE CONSIDERED LOW RISK FOR COVID-19.  What to do if you are HIGH RISK for COVID-19?  Marland Kitchen If you are having a medical emergency, call 911. . Seek medical care right away. Before you go to a doctor's office, urgent care or emergency department, call ahead and tell them about your  recent travel, contact with someone diagnosed with COVID-19, and your symptoms. You should receive instructions from your physician's office regarding next steps of care.  . When you arrive at healthcare provider, tell the healthcare staff immediately you have returned from visiting [place], [place], [place], [place] or [place]; or traveled in the [place] to [place], [place], [place], or [place]; in the last two weeks or you have been in close contact with a person diagnosed with COVID-19 in the last 2 weeks.   . Tell the health care staff about your symptoms: fever, cough and shortness of breath. . After you have been seen by a medical provider, you will be either: o Tested for (COVID-19) and discharged home on quarantine except to seek medical care if symptoms worsen, and asked to  - Stay home and avoid contact with others until you get your results (4-5 days)  - Avoid travel on public transportation if possible (such as bus, train, or airplane) or o Sent to the Emergency Department by EMS for evaluation, COVID-19 testing, and possible admission depending on your condition and test results.  What to do if you are LOW RISK for COVID-19?  Reduce your risk of any infection by using the same precautions used for avoiding the common cold or flu:  Marland Kitchen Wash your hands often with soap and warm water for at least 20 seconds.  If soap and water are not readily available, use an alcohol-based hand sanitizer with at least 60% alcohol.  . If coughing  or sneezing, cover your mouth and nose by coughing or sneezing into the elbow areas of your shirt or coat, into a tissue or into your sleeve (not your hands). . Avoid shaking hands with others and consider head nods or verbal greetings only. . Avoid touching your eyes, nose, or mouth with unwashed hands.  . Avoid close contact with people who are sick. . Avoid places or events with large numbers of people in one location, like concerts or sporting  events. . Carefully consider travel plans you have or are making. . If you are planning any travel outside or inside the [place], visit the CDC's Travelers' Health webpage for the latest health notices. . If you have some symptoms but not all symptoms, continue to monitor at home and seek medical attention if your symptoms worsen. . If you are having a medical emergency, call 911.   [facility] / e-Visit: [email or web address]         [facility]: [facility]: [phone]                   [facility]: [phone]

## 2018-07-30 NOTE — Progress Notes (Signed)
[facility] [phone]   [phone]  OFFICE PROGRESS NOTE  Sid Falcon, MD [place] [address]  DIAGNOSIS: Stage IV (T1b, N0, M1b) non-small cell lung cancer, adenocarcinoma presented with right upper lobe lung nodule and recent metastasis to the small intestine. This was initially diagnosed in September 2017.  Genomic Alterations Identified ERBB2 amplification - equivocal? CDKN2A p16INK4a E88* and p14ARF B638L SMARCA4 splice site [id number]>XB SPTA1 E2022* TOP2A amplification TP53 A159P Additional Findings Microsatellite status MS-Stable Tumor Mutation Burden TMB-Intermediate; 18 Muts/Mb Additional Disease-relevant Genes with No Reportable Alterations Identified EGFR KRAS ALK BRAF MET RET ROS1   PRIOR THERAPY:  1) Status post right VATS with right upper lobectomy and mediastinal lymph node dissection under the care of Dr. Roxan Hockey on 10/18/2015 and the final pathology was consistent with stage IA (T1b, N0, MX). 2) upper endoscopy on 01/05/2016 showed normal esophagus, normal stomach but there was occasional mass around 3.0 CM in length circumferential nonobstructing in the jejunum. The final pathology was consistent with metastatic adenocarcinoma. 3) status post laparoscopic laparotomy and resection of proximal lesion and and distal jejunum/proximal ileum under the care of Dr. Hassell Done 1 02/27/2016. 3)  Systemic chemotherapy with carboplatin for AUC of 5 and Alimta 500 MG/M2 every 3 weeks. First dose 04/04/2016. Status post 2 cycles. Last dose was given 04/21/2016 discontinued secondary to disease progression.   CURRENT THERAPY: Second line immunotherapy with Ketruda 200 mg IV every 2 weeks, first dose 05/30/2016. Status post 35 cycles.  INTERVAL HISTORY: Brett [name] 58 y.o. male returns to the clinic for follow-up visit.  The patient is feeling fine today with no concerning complaints.  He lost few pounds  since his last visit.  He continues to have few episodes of diarrhea and he takes Imodium.  He denied having any chest pain, shortness of breath, cough or hemoptysis.  He has no nausea, vomiting or constipation.  He denied having any headache or visual changes.  He is here today for evaluation before starting cycle #36.   MEDICAL HISTORY: Past Medical History:  Diagnosis Date   Barrett's esophagus 07/01/2013   Without dysplasia on biopsy 09/03/2012. Repeat EGD recommended 08/2015   Bilateral cataracts 02/13/2017   Carotid artery stenosis 07/01/2013   Requiring right sided stent    Chronic pain syndrome 07/01/2013   Closed head injury with brief loss of consciousness ([place]) 07/22/2010   Head trapped in a hydraulic device at work.  Fracture of orbital bones on right and brief loss of consciousness per report.   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 There was likely a strong psycho-social overlay in regard to the cognitive deficits in the form of mood disorder with psychotic features and mixed anxiety symptomatology. His primary tested cognitive deficits are in the areas of attention, executi   Daily headache "since 07/2010"   constantly   Degenerative joint disease of cervical spine 07/01/2013   Dupuytren's contracture of both hands 04/08/2014   Encounter for antineoplastic chemotherapy 10/05/2015   Encounter for antineoplastic immunotherapy 05/24/2016   Erectile dysfunction associated with type 2 diabetes mellitus ([facility]) 07/01/2013   Fibromyalgia 07/01/2013   Goals of care, counseling/discussion 03/28/2016   History of blood transfusion 11/28/2015   "suppose to get his first today" (11/28/2015)   Hyperlipidemia LDL goal < 100 07/01/2013   Intractable hiccups 04/11/2016  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   Memory changes    "memory issues"  from head injury   Moderate protein-calorie malnutrition ([place]) 11/29/2015   Osteoarthritis of right thumb 10/21/2014   Peripheral vascular occlusive disease ([place]) 07/01/2013   Requiring 2 arterial stents above the left knee per report   Pneumonia ~ 2006/2007   Post traumatic stress disorder 07/01/2013   Primary lung adenocarcinoma ([place]) dx'd 08/2015   "right lung"   Severe major depression with psychotic features ([place]) 04/15/2011   Tobacco abuse 07/01/2013   Type 2 diabetes mellitus with vascular disease ([place]) 07/01/2013   Left lower extremity and right carotid stenting    ALLERGIES:  is allergic to gabapentin; lyrica [pregabalin]; jardiance [empagliflozin]; celebrex [celecoxib]; and contrast media [iodinated diagnostic agents].  MEDICATIONS:  Current Outpatient Medications  Medication Sig Dispense Refill   ACCU-CHEK SOFTCLIX LANCETS lancets Use to test blood glucose 1-2 times daily. Dx Code E11.59 100 each 12   aspirin EC 81 MG tablet Take 81 mg by mouth daily.      atorvastatin (LIPITOR) 40 MG tablet Take 1 tablet (40 mg total) by mouth daily. 90 tablet 3   Blood Glucose Monitoring Suppl (ACCU-CHEK AVIVA PLUS) w/Device KIT Use to test blood glucose 1-2 times daily. Dx Code E11.59 1 kit 0   cyclobenzaprine (FLEXERIL) 10 MG tablet Take 1 tablet (10 mg total) by mouth 3 (three) times daily as needed for muscle spasms. (Patient not taking: Reported on 03/26/2018) 90 tablet 11   diphenhydrAMINE (BENADRYL) 25 mg capsule Take 2 capsules (50 mg total) by mouth as directed. Take prior to CT scan as directed (Patient not taking: Reported on 12/03/2017) 30 capsule 0   glipiZIDE (GLUCOTROL) 10 MG tablet Take 1 tablet (10 mg total) by mouth 2 (two) times daily before a meal. 180 tablet 3   glucose blood test strip USE TO TEST BLOOD GLUCOSE 1 TO 2 TIMES DAILY 100 each 4   hydrOXYzine (ATARAX/VISTARIL) 10 MG tablet Take 1 tablet (10 mg total) by mouth 3 (three) times daily as needed for  itching. (Patient not taking: Reported on 03/26/2018) 30 tablet 0   insulin aspart (NOVOLOG) 100 UNIT/ML FlexPen Inject 5 Units into the skin daily. Just before dinner 3 mL 6   Insulin Glargine (LANTUS) 100 UNIT/ML Solostar Pen Inject 20 Units into the skin daily. 15 mL 3   Insulin Pen Needle (PEN NEEDLES) 32G X 4 MM MISC 1 pen by Does not apply route 2 (two) times daily. Dx Code E11.59 200 each 6   levothyroxine (SYNTHROID) 50 MCG tablet Take 1 tablet (50 mcg total) by mouth daily before breakfast. 30 tablet 0   lidocaine-prilocaine (EMLA) cream Apply 1 application topically as needed. 30 g 0   lisinopril (ZESTRIL) 5 MG tablet Take 0.5 tablets (2.5 mg total) by mouth daily. 90 tablet 3   metFORMIN (GLUCOPHAGE-XR) 500 MG 24 hr tablet Take 1 tablet (500 mg total) by mouth 2 (two) times daily. 180 tablet 3   oxyCODONE-acetaminophen (PERCOCET) 10-325 MG tablet Take 1-2 tablets by mouth every 6 (six) hours as needed for pain. 240 tablet 0   pantoprazole (PROTONIX) 40 MG tablet TAKE 1 TABLET(40 MG) BY MOUTH DAILY 90 tablet 1   predniSONE (DELTASONE) 50 MG tablet TAKE 1 TABLET BY MOUTH AT 13 HOURS, 7 HOURS, AND 1 HOUR PRIOR TO SCAN 3 tablet 0   Probiotic Product (CULTURELLE IMMUNE DEFENSE) CAPS Take 1 capsule by mouth daily. 30 capsule 11   prochlorperazine (COMPAZINE) 10  MG tablet Take 10 mg by mouth every 6 (six) hours as needed for nausea or vomiting.     sitaGLIPtin (JANUVIA) 100 MG tablet Take 1 tablet (100 mg total) by mouth daily. 90 tablet 3   No current facility-administered medications for this visit.    Facility-Administered Medications Ordered in Other Visits  Medication Dose Route Frequency Provider Last Rate Last Dose   sodium chloride flush (NS) 0.9 % injection 10 mL  10 mL Intracatheter PRN Curt Bears, MD   10 mL at 06/18/18 1333    SURGICAL HISTORY:  Past Surgical History:  Procedure Laterality Date   CAROTID STENT Right ?2014   COLONOSCOPY N/A 11/30/2015    Procedure: COLONOSCOPY;  Surgeon: Teena Irani, MD;  Location[facility] ENDOSCOPY;  Service: Endoscopy;  Laterality: N/A;   ESOPHAGOGASTRODUODENOSCOPY N/A 11/30/2015   Procedure: ESOPHAGOGASTRODUODENOSCOPY (EGD);  Surgeon: Teena Irani, MD;  Location[facility] ENDOSCOPY;  Service: Endoscopy;  Laterality: N/A;   ESOPHAGOGASTRODUODENOSCOPY (EGD) WITH PROPOFOL N/A 01/05/2016   Procedure: ESOPHAGOGASTRODUODENOSCOPY (EGD) WITH PROPOFOL;  Surgeon: Teena Irani, MD;  Location: [facility] ENDOSCOPY;  Service: Endoscopy;  Laterality: N/A;   FEMORAL ARTERY STENT Left 05/2012; ~ 2015   Archie Endo 06/04/2012; Raechel Chute report   FRACTURE SURGERY     GIVENS CAPSULE STUDY N/A 12/22/2015   Procedure: GIVENS CAPSULE STUDY;  Surgeon: Wonda Horner, MD;  Location: [facility] ENDOSCOPY;  Service: Endoscopy;  Laterality: N/A;   HARDWARE REMOVAL Right 11/15/2011   Removal of deep frontozygomatic orbital hardware/notes 11/15/2011   HERNIA REPAIR  [id number]   Umbilical   LAPAROSCOPY N/A 02/27/2016   Procedure: LAPAROSCOPY, LAPAROTOMY  WITH TWO SMALL BOWEL RESECTION;  Surgeon: Johnathan Hausen, MD;  Location: [facility] ORS;  Service: General;  Laterality: N/A;   ORIF ORBITAL FRACTURE Right 08/15/2010    caught in a hydraulic machine; open reduction internal fixation of orbital rim fracture and open reduction of zygomatic arch fracture  /notes 10/13/2009   PORTACATH PLACEMENT Left 04/04/2016   Procedure: INSERTION PORT-A-CATH LEFT CHEST;  Surgeon: Melrose Nakayama, MD;  Location: [facility];  Service: Thoracic;  Laterality: Left;   [place] (VATS)/ LOBECTOMY Right 10/18/2015   Procedure: VIDEO ASSISTED THORACOSCOPY (VATS)/ LOBECTOMY;  Surgeon: Melrose Nakayama, MD;  Location: [facility];  Service: Thoracic;  Laterality: Right;   VIDEO BRONCHOSCOPY Bilateral 09/21/2015   Procedure: VIDEO BRONCHOSCOPY WITH FLUORO;  Surgeon: Juanito Doom, MD;  Location: [facility] ENDOSCOPY;  Service: Cardiopulmonary;  Laterality: Bilateral;    REVIEW OF SYSTEMS:  A  comprehensive review of systems was negative except for: Constitutional: positive for fatigue Gastrointestinal: positive for diarrhea   PHYSICAL EXAMINATION: General appearance: alert, cooperative and no distress Head: Normocephalic, without obvious abnormality, atraumatic Neck: no adenopathy, no JVD, supple, symmetrical, trachea midline and thyroid not enlarged, symmetric, no tenderness/mass/nodules Lymph nodes: Cervical, supraclavicular, and axillary nodes normal. Resp: clear to auscultation bilaterally Back: symmetric, no curvature. ROM normal. No CVA tenderness. Cardio: regular rate and rhythm, S1, S2 normal, no murmur, click, rub or gallop GI: soft, non-tender; bowel sounds normal; no masses,  no organomegaly Extremities: extremities normal, atraumatic, no cyanosis or edema  ECOG PERFORMANCE STATUS: 1 - Symptomatic but completely ambulatory  Blood pressure 96/82, pulse (!) 110, temperature 99 F (37.2 C), temperature source Oral, resp. rate 18, height 5' 7" (1.702 m), weight 109 lb 9.6 oz (49.7 kg), SpO2 100 %.  LABORATORY DATA: Lab Results  Component Value Date   WBC 6.8 07/30/2018   HGB 14.4 07/30/2018   HCT 44.1 07/30/2018  MCV 92.8 07/30/2018   PLT 210 07/30/2018      Chemistry      Component Value Date/Time   NA 143 07/09/2018 0801   NA 140 01/23/2017 0910   K 3.7 07/09/2018 0801   K 4.5 01/23/2017 0910   CL 107 07/09/2018 0801   CO2 25 07/09/2018 0801   CO2 28 01/23/2017 0910   BUN 4 (L) 07/09/2018 0801   BUN 8.1 01/23/2017 0910   CREATININE 0.77 07/09/2018 0801   CREATININE 0.8 01/23/2017 0910   GLU 398 (H) 08/08/2016 1257      Component Value Date/Time   CALCIUM 8.6 (L) 07/09/2018 0801   CALCIUM 9.6 01/23/2017 0910   ALKPHOS 122 07/09/2018 0801   ALKPHOS 113 01/23/2017 0910   AST 39 07/09/2018 0801   AST 11 01/23/2017 0910   ALT 42 07/09/2018 0801   ALT 12 01/23/2017 0910   BILITOT 0.4 07/09/2018 0801   BILITOT 0.40 01/23/2017 0910        RADIOGRAPHIC STUDIES: No results found.   ASSESSMENT AND PLAN:  This is a very pleasant 58 years old white male with metastatic non-small cell lung cancer, adenocarcinoma status post right upper lobectomy with lymph node dissection. Unfortunately the patient was found to have metastatic disease in the jejunum and terminal ileum.  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 The patient was started on treatment with systemic chemotherapy with carboplatin and Alimta for 2 cycles discontinued secondary to intolerance and disease progression. The patient is currently undergoing treatment with second line immunotherapy with Ketruda (pembrolizumab) 200 mg IV every 3 weeks, status post 35 cycles. The patient tolerated the last cycle of his treatment well with no concerning adverse effects. I recommended for him to proceed with cycle #36 today. I will see him back for follow-up visit in 3 weeks for evaluation after repeating CT scan of the chest, abdomen and pelvis for restaging of his disease. For the diarrhea, he will continue on Imodium. For the diabetes mellitus he is followed by Dr. Daryll Drown at the [facility] internal medicine teaching program. The patient was advised to call immediately if he has any concerning symptoms in the interval. The patient voices understanding of current disease status and treatment options and is in agreement with the current care plan. All questions were answered. The patient knows to call the clinic with any problems, questions or concerns. We can certainly see the patient much sooner if necessary.  Disclaimer: This note was dictated with voice recognition software. Similar sounding words can inadvertently be transcribed and may not be corrected upon review.

## 2018-07-30 NOTE — Progress Notes (Signed)
Per Dr. Julien Nordmann ok to treat with heart rate=110. Pt. denies complaints of chestpain, dizziness, and no shortness of breath noted.

## 2018-08-14 ENCOUNTER — Other Ambulatory Visit: Payer: Self-pay | Admitting: Internal Medicine

## 2018-08-14 DIAGNOSIS — C3491 Malignant neoplasm of unspecified part of right bronchus or lung: Secondary | ICD-10-CM

## 2018-08-18 ENCOUNTER — Telehealth: Payer: Self-pay | Admitting: *Deleted

## 2018-08-18 NOTE — Telephone Encounter (Signed)
[name] from Radiology called to make sure patient is going to be premedicated for his contrast allergy for his scans that are scheduled for tomorrow.  Prednisone and Benadryl were ordered.  Attempted to call patient to go over the premedication requirements. LM, pending call back.

## 2018-08-19 ENCOUNTER — Ambulatory Visit (HOSPITAL_COMMUNITY)
Admission: RE | Admit: 2018-08-19 | Discharge: 2018-08-19 | Disposition: A | Discharge status: Home / Self Care | Payer: Medicare HMO | Source: Ambulatory Visit | Attending: Physician Assistant | Admitting: Physician Assistant

## 2018-08-19 ENCOUNTER — Other Ambulatory Visit: Payer: Self-pay

## 2018-08-19 ENCOUNTER — Encounter (HOSPITAL_COMMUNITY): Payer: Self-pay

## 2018-08-19 DIAGNOSIS — K760 Fatty (change of) liver, not elsewhere classified: Secondary | ICD-10-CM | POA: Diagnosis not present

## 2018-08-19 DIAGNOSIS — C3491 Malignant neoplasm of unspecified part of right bronchus or lung: Secondary | ICD-10-CM | POA: Diagnosis not present

## 2018-08-19 DIAGNOSIS — J439 Emphysema, unspecified: Secondary | ICD-10-CM | POA: Diagnosis not present

## 2018-08-19 MED ORDER — IOHEXOL 300 MG/ML  SOLN
100.0000 mL | Freq: Once | INTRAMUSCULAR | Status: AC | PRN
  Administered 2018-08-19: 09:00:00 100 mL via INTRAVENOUS

## 2018-08-19 MED ORDER — SODIUM CHLORIDE (PF) 0.9 % IJ SOLN
INTRAMUSCULAR | Status: AC
  Filled 2018-08-19: qty 50

## 2018-08-20 ENCOUNTER — Inpatient Hospital Stay (HOSPITAL_BASED_OUTPATIENT_CLINIC_OR_DEPARTMENT_OTHER): Payer: Medicare HMO | Admitting: Internal Medicine

## 2018-08-20 ENCOUNTER — Other Ambulatory Visit: Payer: Self-pay

## 2018-08-20 ENCOUNTER — Encounter: Payer: Self-pay | Admitting: Internal Medicine

## 2018-08-20 ENCOUNTER — Inpatient Hospital Stay: Payer: Medicare HMO

## 2018-08-20 ENCOUNTER — Telehealth: Payer: Self-pay | Admitting: Internal Medicine

## 2018-08-20 VITALS — BP 98/75 | HR 101 | Temp 99.1°F | Resp 18 | Ht 67.0 in | Wt 104.0 lb

## 2018-08-20 VITALS — HR 103

## 2018-08-20 DIAGNOSIS — C784 Secondary malignant neoplasm of small intestine: Secondary | ICD-10-CM | POA: Diagnosis not present

## 2018-08-20 DIAGNOSIS — R197 Diarrhea, unspecified: Secondary | ICD-10-CM

## 2018-08-20 DIAGNOSIS — C3411 Malignant neoplasm of upper lobe, right bronchus or lung: Secondary | ICD-10-CM | POA: Diagnosis not present

## 2018-08-20 DIAGNOSIS — Z5112 Encounter for antineoplastic immunotherapy: Secondary | ICD-10-CM | POA: Diagnosis not present

## 2018-08-20 DIAGNOSIS — E119 Type 2 diabetes mellitus without complications: Secondary | ICD-10-CM | POA: Diagnosis not present

## 2018-08-20 DIAGNOSIS — Z79899 Other long term (current) drug therapy: Secondary | ICD-10-CM | POA: Diagnosis not present

## 2018-08-20 DIAGNOSIS — C3491 Malignant neoplasm of unspecified part of right bronchus or lung: Secondary | ICD-10-CM

## 2018-08-20 DIAGNOSIS — C171 Malignant neoplasm of jejunum: Secondary | ICD-10-CM

## 2018-08-20 DIAGNOSIS — C3490 Malignant neoplasm of unspecified part of unspecified bronchus or lung: Secondary | ICD-10-CM

## 2018-08-20 DIAGNOSIS — Z95828 Presence of other vascular implants and grafts: Secondary | ICD-10-CM

## 2018-08-20 LAB — CMP (CANCER CENTER ONLY)
ALT: 88 U/L — ABNORMAL HIGH (ref 0–44)
AST: 77 U/L — ABNORMAL HIGH (ref 15–41)
Albumin: 3.5 g/dL (ref 3.5–5.0)
Alkaline Phosphatase: 138 U/L — ABNORMAL HIGH (ref 38–126)
Anion gap: 9 (ref 5–15)
BUN: 13 mg/dL (ref 6–20)
CO2: 27 mmol/L (ref 22–32)
Calcium: 9.2 mg/dL (ref 8.9–10.3)
Chloride: 103 mmol/L (ref 98–111)
Creatinine: 0.96 mg/dL (ref 0.61–1.24)
GFR, Est AFR Am: >60 mL/min (ref >60)
GFR, Estimated: >60 mL/min (ref >60)
Glucose, Bld: 295 mg/dL — ABNORMAL HIGH (ref 70–99)
Potassium: 4 mmol/L (ref 3.5–5.1)
Sodium: 139 mmol/L (ref 135–145)
Total Bilirubin: 0.5 mg/dL (ref 0.3–1.2)
Total Protein: 6.4 g/dL — ABNORMAL LOW (ref 6.5–8.1)

## 2018-08-20 LAB — CBC WITH DIFFERENTIAL (CANCER CENTER ONLY)
Abs Immature Granulocytes: 0.07 10*3/uL (ref 0.00–0.07)
Basophils Absolute: 0 10*3/uL (ref 0.0–0.1)
Basophils Relative: 0 %
Eosinophils Absolute: 0 10*3/uL (ref 0.0–0.5)
Eosinophils Relative: 0 %
HCT: 42.6 % (ref 39.0–52.0)
Hemoglobin: 14.2 g/dL (ref 13.0–17.0)
Immature Granulocytes: 1 %
Lymphocytes Relative: 18 %
Lymphs Abs: 1.7 10*3/uL (ref 0.7–4.0)
MCH: 30.7 pg (ref 26.0–34.0)
MCHC: 33.3 g/dL (ref 30.0–36.0)
MCV: 92.2 fL (ref 80.0–100.0)
Monocytes Absolute: 0.7 10*3/uL (ref 0.1–1.0)
Monocytes Relative: 8 %
Neutro Abs: 6.8 10*3/uL (ref 1.7–7.7)
Neutrophils Relative %: 73 %
Platelet Count: 271 10*3/uL (ref 150–400)
RBC: 4.62 MIL/uL (ref 4.22–5.81)
RDW: 13.2 % (ref 11.5–15.5)
WBC Count: 9.3 10*3/uL (ref 4.0–10.5)
nRBC: 0 % (ref 0.0–0.2)

## 2018-08-20 LAB — TSH: TSH: 5.619 u[IU]/mL — ABNORMAL HIGH (ref 0.320–4.118)

## 2018-08-20 MED ORDER — SODIUM CHLORIDE 0.9 % IV SOLN
Freq: Once | INTRAVENOUS | Status: AC
  Administered 2018-08-20: 11:00:00 via INTRAVENOUS
  Filled 2018-08-20: qty 250

## 2018-08-20 MED ORDER — SODIUM CHLORIDE 0.9% FLUSH
10.0000 mL | INTRAVENOUS | Status: DC | PRN
  Administered 2018-08-20: 10:00:00 10 mL
  Filled 2018-08-20: qty 10

## 2018-08-20 MED ORDER — HEPARIN SOD (PORK) LOCK FLUSH 100 UNIT/ML IV SOLN
500.0000 [IU] | Freq: Once | INTRAVENOUS | Status: AC | PRN
  Administered 2018-08-20: 500 [IU]
  Filled 2018-08-20: qty 5

## 2018-08-20 MED ORDER — SODIUM CHLORIDE 0.9% FLUSH
10.0000 mL | INTRAVENOUS | Status: DC | PRN
  Administered 2018-08-20: 10 mL
  Filled 2018-08-20: qty 10

## 2018-08-20 MED ORDER — SODIUM CHLORIDE 0.9 % IV SOLN
200.0000 mg | Freq: Once | INTRAVENOUS | Status: AC
  Administered 2018-08-20: 200 mg via INTRAVENOUS
  Filled 2018-08-20: qty 8

## 2018-08-20 NOTE — Telephone Encounter (Signed)
Gave avs and calendar ° °

## 2018-08-20 NOTE — Progress Notes (Signed)
[place] [phone]) [phone]   Fax:([phone]) [phone]  OFFICE PROGRESS NOTE  Brett Falcon, MD [place] [address]  DIAGNOSIS: Stage IV (T1b, N0, M1b) non-small cell lung cancer, adenocarcinoma presented with right upper lobe lung nodule and recent metastasis to the small intestine. This was initially diagnosed in September 2017.  Genomic Alterations Identified ERBB2 amplification - equivocal? CDKN2A p16INK4a E88* and p14ARF E174Y SMARCA4 splice site [id number]>SH SPTA1 E2022* TOP2A amplification TP53 A159P Additional Findings Microsatellite status MS-Stable Tumor Mutation Burden TMB-Intermediate; 18 Muts/Mb Additional Disease-relevant Genes with No Reportable Alterations Identified EGFR KRAS ALK BRAF MET RET ROS1   PRIOR THERAPY:  1) Status post right VATS with right upper lobectomy and mediastinal lymph node dissection under the care of Dr. Roxan Hockey on 10/18/2015 and the final pathology was consistent with stage IA (T1b, N0, MX). 2) upper endoscopy on 01/05/2016 showed normal esophagus, normal stomach but there was occasional mass around 3.0 CM in length circumferential nonobstructing in the jejunum. The final pathology was consistent with metastatic adenocarcinoma. 3) status post laparoscopic laparotomy and resection of proximal lesion and and distal jejunum/proximal ileum under the care of Dr. Hassell Done 1 02/27/2016. 3)  Systemic chemotherapy with carboplatin for AUC of 5 and Alimta 500 MG/M2 every 3 weeks. First dose 04/04/2016. Status post 2 cycles. Last dose was given 04/21/2016 discontinued secondary to disease progression.   CURRENT THERAPY: Second line immunotherapy with Ketruda 200 mg IV every 2 weeks, first dose 05/30/2016. Status post 36 cycles.  INTERVAL HISTORY: Brett [name] 58 y.o. male returns to the clinic today for follow-up visit.  The patient is feeling fine today with no concerning complaints except for few  episodes of diarrhea which significantly improved when he takes Imodium.  He denied having any chest pain, shortness of breath, cough or hemoptysis.  He denied having any fever or chills.  He has no nausea, vomiting or constipation.  He has no significant weight loss or night sweats.  He continues to tolerate his treatment with Keytruda fairly well.  The patient had repeat CT scan of the chest, abdomen pelvis performed recently and is here for evaluation and discussion of his scan results.  MEDICAL HISTORY: Past Medical History:  Diagnosis Date   Barrett's esophagus 07/01/2013   Without dysplasia on biopsy 09/03/2012. Repeat EGD recommended 08/2015   Bilateral cataracts 02/13/2017   Carotid artery stenosis 07/01/2013   Requiring right sided stent    Chronic pain syndrome 07/01/2013   Closed head injury with brief loss of consciousness ([place]) 07/22/2010   Head trapped in a hydraulic device at work.  Fracture of orbital bones on right and brief loss of consciousness per report.   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 There was likely a strong psycho-social overlay in regard to the cognitive deficits in the form of mood disorder with psychotic features and mixed anxiety symptomatology. His primary tested cognitive deficits are in the areas of attention, executi   Daily headache "since 07/2010"   constantly   Degenerative joint disease of cervical spine 07/01/2013   Dupuytren's contracture of both hands 04/08/2014   Encounter for antineoplastic chemotherapy 10/05/2015   Encounter for antineoplastic immunotherapy 05/24/2016   Erectile dysfunction associated with type 2 diabetes mellitus (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   Memory changes    "memory issues" from head injury   Moderate protein-calorie malnutrition ([place]) 11/29/2015   Osteoarthritis of right thumb 10/21/2014   Peripheral vascular occlusive disease ([place]) 07/01/2013   Requiring 2 arterial stents above the left knee per report   Pneumonia ~ 2006/2007   Post traumatic stress disorder 07/01/2013   Primary lung adenocarcinoma ([place]) dx'd 08/2015   "right lung"   Severe major depression with psychotic features ([place]) 04/15/2011   Tobacco abuse 07/01/2013   Type 2 diabetes mellitus with vascular disease ([place]) 07/01/2013   Left lower extremity and right carotid stenting    ALLERGIES:  is allergic to gabapentin; lyrica [pregabalin]; jardiance [empagliflozin]; celebrex [celecoxib]; and contrast media [iodinated diagnostic agents].  MEDICATIONS:  Current Outpatient Medications  Medication Sig Dispense Refill   ACCU-CHEK SOFTCLIX LANCETS lancets Use to test blood glucose 1-2 times daily. Dx Code E11.59 100 each 12   aspirin EC 81 MG tablet Take 81 mg by mouth daily.      atorvastatin (LIPITOR) 40 MG tablet Take 1 tablet (40 mg total) by mouth daily. 90 tablet 3   Blood Glucose Monitoring Suppl (ACCU-CHEK AVIVA PLUS) w/Device KIT Use to test blood glucose 1-2 times daily. Dx Code E11.59 1 kit 0   cyclobenzaprine (FLEXERIL) 10 MG tablet Take 1 tablet (10 mg total) by mouth 3 (three) times daily as needed for muscle spasms. (Patient not taking: Reported on 03/26/2018) 90 tablet 11   diphenhydrAMINE (BENADRYL) 25 mg capsule Take 2 capsules (50 mg total) by mouth as directed. Take prior to CT scan as directed (Patient not taking: Reported on 12/03/2017) 30 capsule 0   glipiZIDE (GLUCOTROL) 10 MG tablet Take 1 tablet (10 mg total) by mouth 2 (two) times daily before a meal. 180 tablet 3   glucose blood test strip USE TO  TEST BLOOD GLUCOSE 1 TO 2 TIMES DAILY 100 each 4   hydrOXYzine (ATARAX/VISTARIL) 10 MG tablet Take 1 tablet (10 mg total) by mouth 3 (three) times daily as needed for itching. (Patient not taking: Reported on 03/26/2018) 30 tablet 0   insulin aspart (NOVOLOG) 100 UNIT/ML FlexPen Inject 5 Units into the skin daily. Just before dinner 3 mL 6   Insulin Glargine (LANTUS) 100 UNIT/ML Solostar Pen Inject 20 Units into the skin daily. 15 mL 3   Insulin Pen Needle (PEN NEEDLES) 32G X 4 MM MISC 1 pen by Does not apply route 2 (two) times daily. Dx Code E11.59 200 each 6   levothyroxine (SYNTHROID) 50 MCG tablet Take 1 tablet (50 mcg total) by mouth daily before breakfast. 30 tablet 2   lidocaine-prilocaine (EMLA) cream Apply 1 application topically as needed. 30 g 0   lisinopril (ZESTRIL) 5 MG tablet Take 0.5 tablets (2.5 mg total) by mouth daily. 90 tablet 3   metFORMIN (GLUCOPHAGE-XR) 500 MG 24 hr tablet Take 1 tablet (500 mg total) by mouth 2 (two) times daily. 180 tablet 3   oxyCODONE-acetaminophen (PERCOCET) 10-325 MG tablet Take 1-2 tablets by mouth every 6 (six) hours as needed for pain. 240 tablet 0   pantoprazole (PROTONIX) 40 MG tablet TAKE 1 TABLET(40 MG) BY MOUTH DAILY 90 tablet 1   predniSONE (DELTASONE) 50 MG tablet TAKE 1 TABLET BY MOUTH AT 13 HOURS, 7 HOURS, AND 1 HOUR PRIOR TO  SCAN 3 tablet 0   Probiotic Product (CULTURELLE IMMUNE DEFENSE) CAPS Take 1 capsule by mouth daily. 30 capsule 11   prochlorperazine (COMPAZINE) 10 MG tablet Take 10 mg by mouth every 6 (six) hours as needed for nausea or vomiting.     sitaGLIPtin (JANUVIA) 100 MG tablet Take 1 tablet (100 mg total) by mouth daily. 90 tablet 3   No current facility-administered medications for this visit.    Facility-Administered Medications Ordered in Other Visits  Medication Dose Route Frequency Provider Last Rate Last Dose   sodium chloride flush (NS) 0.9 % injection 10 mL  10 mL Intracatheter PRN Curt Bears,  MD   10 mL at 06/18/18 1333    SURGICAL HISTORY:  Past Surgical History:  Procedure Laterality Date   CAROTID STENT Right ?2014   COLONOSCOPY N/A 11/30/2015   Procedure: COLONOSCOPY;  Surgeon: Teena Irani, MD;  Location[facility] ENDOSCOPY;  Service: Endoscopy;  Laterality: N/A;   ESOPHAGOGASTRODUODENOSCOPY N/A 11/30/2015   Procedure: ESOPHAGOGASTRODUODENOSCOPY (EGD);  Surgeon: Teena Irani, MD;  Location: [facility] ENDOSCOPY;  Service: Endoscopy;  Laterality: N/A;   ESOPHAGOGASTRODUODENOSCOPY (EGD) WITH PROPOFOL N/A 01/05/2016   Procedure: ESOPHAGOGASTRODUODENOSCOPY (EGD) WITH PROPOFOL;  Surgeon: Teena Irani, MD;  Location: [facility] ENDOSCOPY;  Service: Endoscopy;  Laterality: N/A;   FEMORAL ARTERY STENT Left 05/2012; ~ 2015   Archie Endo 06/04/2012; Raechel Chute report   FRACTURE SURGERY     GIVENS CAPSULE STUDY N/A 12/22/2015   Procedure: GIVENS CAPSULE STUDY;  Surgeon: Wonda Horner, MD;  Location: [facility] ENDOSCOPY;  Service: Endoscopy;  Laterality: N/A;   HARDWARE REMOVAL Right 11/15/2011   Removal of deep frontozygomatic orbital hardware/notes 11/15/2011   HERNIA REPAIR  [id number]   Umbilical   LAPAROSCOPY N/A 02/27/2016   Procedure: LAPAROSCOPY, LAPAROTOMY  WITH TWO SMALL BOWEL RESECTION;  Surgeon: Johnathan Hausen, MD;  Location: [facility] ORS;  Service: General;  Laterality: N/A;   ORIF ORBITAL FRACTURE Right 08/15/2010    caught in a hydraulic machine; open reduction internal fixation of orbital rim fracture and open reduction of zygomatic arch fracture  /notes 10/13/2009   PORTACATH PLACEMENT Left 04/04/2016   Procedure: INSERTION PORT-A-CATH LEFT CHEST;  Surgeon: Melrose Nakayama, MD;  Location: [facility];  Service: Thoracic;  Laterality: Left;   [place] (VATS)/ LOBECTOMY Right 10/18/2015   Procedure: VIDEO ASSISTED THORACOSCOPY (VATS)/ LOBECTOMY;  Surgeon: Melrose Nakayama, MD;  Location: [facility];  Service: Thoracic;  Laterality: Right;   VIDEO BRONCHOSCOPY Bilateral 09/21/2015   Procedure: VIDEO  BRONCHOSCOPY WITH FLUORO;  Surgeon: Juanito Doom, MD;  Location: [facility] ENDOSCOPY;  Service: Cardiopulmonary;  Laterality: Bilateral;    REVIEW OF SYSTEMS:  Constitutional: negative Eyes: negative Ears, nose, mouth, throat, and face: negative Respiratory: negative Cardiovascular: negative Gastrointestinal: positive for diarrhea Genitourinary:negative Integument/breast: negative Hematologic/lymphatic: negative Musculoskeletal:negative Neurological: negative Behavioral/Psych: negative Endocrine: negative Allergic/Immunologic: negative   PHYSICAL EXAMINATION: General appearance: alert, cooperative and no distress Head: Normocephalic, without obvious abnormality, atraumatic Neck: no adenopathy, no JVD, supple, symmetrical, trachea midline and thyroid not enlarged, symmetric, no tenderness/mass/nodules Lymph nodes: Cervical, supraclavicular, and axillary nodes normal. Resp: clear to auscultation bilaterally Back: symmetric, no curvature. ROM normal. No CVA tenderness. Cardio: regular rate and rhythm, S1, S2 normal, no murmur, click, rub or gallop GI: soft, non-tender; bowel sounds normal; no masses,  no organomegaly Extremities: extremities normal, atraumatic, no cyanosis or edema Neurologic: Alert and oriented X 3, normal strength and tone. Normal symmetric reflexes. Normal coordination and gait  ECOG PERFORMANCE STATUS: 1 - Symptomatic but  completely ambulatory  Blood pressure 98/75, pulse (!) 101, temperature 99.1 F (37.3 C), temperature source Oral, resp. rate 18, height 5' 7"  (1.702 m), weight 104 lb (47.2 kg), SpO2 98 %.  LABORATORY DATA: Lab Results  Component Value Date   WBC 9.3 08/20/2018   HGB 14.2 08/20/2018   HCT 42.6 08/20/2018   MCV 92.2 08/20/2018   PLT 271 08/20/2018      Chemistry      Component Value Date/Time   NA 140 07/30/2018 1054   NA 140 01/23/2017 0910   K 4.3 07/30/2018 1054   K 4.5 01/23/2017 0910   CL 105 07/30/2018 1054   CO2 25  07/30/2018 1054   CO2 28 01/23/2017 0910   BUN 6 07/30/2018 1054   BUN 8.1 01/23/2017 0910   CREATININE 0.75 07/30/2018 1054   CREATININE 0.8 01/23/2017 0910   GLU 398 (H) 08/08/2016 1257      Component Value Date/Time   CALCIUM 8.5 (L) 07/30/2018 1054   CALCIUM 9.6 01/23/2017 0910   ALKPHOS 138 (H) 07/30/2018 1054   ALKPHOS 113 01/23/2017 0910   AST 45 (H) 07/30/2018 1054   AST 11 01/23/2017 0910   ALT 45 (H) 07/30/2018 1054   ALT 12 01/23/2017 0910   BILITOT 0.5 07/30/2018 1054   BILITOT 0.40 01/23/2017 0910       RADIOGRAPHIC STUDIES: Ct Chest W Contrast  Result Date: 08/19/2018 CLINICAL DATA:  History of right lung cancer with small-bowel metastases. Keytruda therapy ongoing. Nausea. EXAM: CT CHEST, ABDOMEN, AND PELVIS WITH CONTRAST TECHNIQUE: Multidetector CT imaging of the chest, abdomen and pelvis was performed following the standard protocol during bolus administration of intravenous contrast. CONTRAST:  146m OMNIPAQUE IOHEXOL 300 MG/ML  SOLN COMPARISON:  CT 06/17/2018 and 02/19/2018. FINDINGS: CT CHEST FINDINGS Cardiovascular: Atherosclerosis of the aorta, great vessels and coronary arteries. Left subclavian Port-A-Cath extends to the upper SVC. No acute vascular findings. The heart size is normal. There is no pericardial effusion. Mediastinum/Nodes: There are no enlarged mediastinal, hilar or axillary lymph nodes. The thyroid gland, trachea and esophagus demonstrate no significant findings. Lungs/Pleura: Status post right upper lobe resection. There is no pleural effusion or pneumothorax. There are stable mild emphysematous changes. No suspicious pulmonary nodules. Musculoskeletal/Chest wall: No chest wall mass or suspicious osseous findings. CT ABDOMEN AND PELVIS FINDINGS Hepatobiliary: Diffuse hepatic steatosis. No focal lesion or abnormal enhancement. No evidence of gallstones, gallbladder wall thickening or biliary dilatation. Pancreas: Mildly atrophied. No focal lesion,  surrounding inflammation or ductal dilatation. Spleen: Normal in size without focal abnormality. Adrenals/Urinary Tract: Both adrenal glands appear normal. The kidneys, ureters and bladder appear normal. Stomach/Bowel: No evidence of bowel wall thickening, distention or surrounding inflammatory change. Stable appearance of the small bowel anastomoses. No recurrent focal mass lesion associated with the bowel. The appendix appears normal. There is moderate stool throughout the colon. Vascular/Lymphatic: Central mesenteric necrotic lymph node is again noted, measuring 3.2 x 1.8 cm on image 83/2. This previously measured 3.1 x 1.6 cm. There is a new centrally necrotic 1 cm left periaortic node on image 70/2. No other enlarged abdominopelvic lymph nodes. There is diffuse aortic and branch vessel atherosclerosis with stable high-grade focal stenosis involving the left external iliac artery (image 107/2). Reproductive: Stable appearance of the prostate gland with internal calcifications. Other: No ascites or peritoneal nodularity. Musculoskeletal: No acute or significant osseous findings. IMPRESSION: 1. The dominant mesenteric lymph node has not significantly changed in size from the most recent study, although there is  a new mildly enlarged periaortic lymph node at the level of the renal veins. No other evidence of metastatic disease. 2. Diffuse hepatic steatosis without focal abnormality. 3. Stable postsurgical changes in the small bowel without evidence of recurrent small bowel lesion. 4. No evidence of metastatic disease in the chest. 5. Aortic Atherosclerosis (ICD10-I70.0). Focal high-grade stenosis of the left external iliac artery. Electronically Signed   By: Richardean Sale M.D.   On: 08/19/2018 16:25   Ct Abdomen Pelvis W Contrast  Result Date: 08/19/2018 CLINICAL DATA:  History of right lung cancer with small-bowel metastases. Keytruda therapy ongoing. Nausea. EXAM: CT CHEST, ABDOMEN, AND PELVIS WITH CONTRAST  TECHNIQUE: Multidetector CT imaging of the chest, abdomen and pelvis was performed following the standard protocol during bolus administration of intravenous contrast. CONTRAST:  129m OMNIPAQUE IOHEXOL 300 MG/ML  SOLN COMPARISON:  CT 06/17/2018 and 02/19/2018. FINDINGS: CT CHEST FINDINGS Cardiovascular: Atherosclerosis of the aorta, great vessels and coronary arteries. Left subclavian Port-A-Cath extends to the upper SVC. No acute vascular findings. The heart size is normal. There is no pericardial effusion. Mediastinum/Nodes: There are no enlarged mediastinal, hilar or axillary lymph nodes. The thyroid gland, trachea and esophagus demonstrate no significant findings. Lungs/Pleura: Status post right upper lobe resection. There is no pleural effusion or pneumothorax. There are stable mild emphysematous changes. No suspicious pulmonary nodules. Musculoskeletal/Chest wall: No chest wall mass or suspicious osseous findings. CT ABDOMEN AND PELVIS FINDINGS Hepatobiliary: Diffuse hepatic steatosis. No focal lesion or abnormal enhancement. No evidence of gallstones, gallbladder wall thickening or biliary dilatation. Pancreas: Mildly atrophied. No focal lesion, surrounding inflammation or ductal dilatation. Spleen: Normal in size without focal abnormality. Adrenals/Urinary Tract: Both adrenal glands appear normal. The kidneys, ureters and bladder appear normal. Stomach/Bowel: No evidence of bowel wall thickening, distention or surrounding inflammatory change. Stable appearance of the small bowel anastomoses. No recurrent focal mass lesion associated with the bowel. The appendix appears normal. There is moderate stool throughout the colon. Vascular/Lymphatic: Central mesenteric necrotic lymph node is again noted, measuring 3.2 x 1.8 cm on image 83/2. This previously measured 3.1 x 1.6 cm. There is a new centrally necrotic 1 cm left periaortic node on image 70/2. No other enlarged abdominopelvic lymph nodes. There is diffuse  aortic and branch vessel atherosclerosis with stable high-grade focal stenosis involving the left external iliac artery (image 107/2). Reproductive: Stable appearance of the prostate gland with internal calcifications. Other: No ascites or peritoneal nodularity. Musculoskeletal: No acute or significant osseous findings. IMPRESSION: 1. The dominant mesenteric lymph node has not significantly changed in size from the most recent study, although there is a new mildly enlarged periaortic lymph node at the level of the renal veins. No other evidence of metastatic disease. 2. Diffuse hepatic steatosis without focal abnormality. 3. Stable postsurgical changes in the small bowel without evidence of recurrent small bowel lesion. 4. No evidence of metastatic disease in the chest. 5. Aortic Atherosclerosis (ICD10-I70.0). Focal high-grade stenosis of the left external iliac artery. Electronically Signed   By: WRichardean SaleM.D.   On: 08/19/2018 16:25     ASSESSMENT AND PLAN:  This is a very pleasant 58years old white male with metastatic non-small cell lung cancer, adenocarcinoma status post right upper lobectomy with lymph node dissection. Unfortunately the patient was found to have metastatic disease in the jejunum and terminal ileum.  He underwent surgical resection of the proximal and distal jejunum as well as the proximal ileum and the final pathology was consistent with high-grade neuroendocrine  carcinoma. The patient was started on treatment with systemic chemotherapy with carboplatin and Alimta for 2 cycles discontinued secondary to intolerance and disease progression. The patient is currently undergoing treatment with second line immunotherapy with Ketruda (pembrolizumab) 200 mg IV every 3 weeks, status post 36 cycles. The patient has been tolerating his treatment with Keytruda fairly well with no concerning adverse effects. He had repeat CT scan of the chest, abdomen pelvis performed recently.  I  personally and independently reviewed the scans and discussed the results with the patient today. Has a scan showed no concerning findings for disease progression but there was new small mildly enlarged periaortic lymph node that need close observation on the upcoming imaging studies. I recommended for the patient to continue his current treatment with [facility] and he will proceed with cycle #37 today. The patient will come back for follow-up visit in 3 weeks for evaluation before the next cycle of his treatment. For the diarrhea, he will continue on Imodium. For the diabetes mellitus he is followed by Dr. Daryll Drown at the [facility] internal medicine teaching program. He was advised to call immediately if he has any concerning symptoms in the interval. The patient voices understanding of current disease status and treatment options and is in agreement with the current care plan. All questions were answered. The patient knows to call the clinic with any problems, questions or concerns. We can certainly see the patient much sooner if necessary.  Disclaimer: This note was dictated with voice recognition software. Similar sounding words can inadvertently be transcribed and may not be corrected upon review.

## 2018-08-20 NOTE — Patient Instructions (Signed)
COVID-19: How to Protect Yourself and Others Know how it spreads  There is currently no vaccine to prevent coronavirus disease 2019 (COVID-19).  The best way to prevent illness is to avoid being exposed to this virus.  The virus is thought to spread mainly from person-to-person. ? Between people who are in close contact with one another (within about 6 feet). ? Through respiratory droplets produced when an infected person coughs, sneezes or talks. ? These droplets can land in the mouths or noses of people who are nearby or possibly be inhaled into the lungs. ? Some recent studies have suggested that COVID-19 may be spread by people who are not showing symptoms. Everyone should Clean your hands often  Wash your hands often with soap and water for at least 20 seconds especially after you have been in a public place, or after blowing your nose, coughing, or sneezing.  If soap and water are not readily available, use a hand sanitizer that contains at least 60% alcohol. Cover all surfaces of your hands and rub them together until they feel dry.  Avoid touching your eyes, nose, and mouth with unwashed hands. Avoid close contact  Stay home if you are sick.  Avoid close contact with people who are sick.  Put distance between yourself and other people. ? Remember that some people without symptoms may be able to spread virus. ? This is especially important for people who are at higher risk of getting very sick.www.cdc.gov/coronavirus/2019-ncov/need-extra-precautions/people-at-higher-risk.html Cover your mouth and nose with a cloth face cover when around others  You could spread COVID-19 to others even if you do not feel sick.  Everyone should wear a cloth face cover when they have to go out in public, for example to the grocery store or to pick up other necessities. ? Cloth face coverings should not be placed on young children under age 2, anyone who has trouble breathing, or is unconscious,  incapacitated or otherwise unable to remove the mask without assistance.  The cloth face cover is meant to protect other people in case you are infected.  Do NOT use a facemask meant for a healthcare worker.  Continue to keep about 6 feet between yourself and others. The cloth face cover is not a substitute for social distancing. Cover coughs and sneezes  If you are in a private setting and do not have on your cloth face covering, remember to always cover your mouth and nose with a tissue when you cough or sneeze or use the inside of your elbow.  Throw used tissues in the trash.  Immediately wash your hands with soap and water for at least 20 seconds. If soap and water are not readily available, clean your hands with a hand sanitizer that contains at least 60% alcohol. Clean and disinfect  Clean AND disinfect frequently touched surfaces daily. This includes tables, doorknobs, light switches, countertops, handles, desks, phones, keyboards, toilets, faucets, and sinks. www.cdc.gov/coronavirus/2019-ncov/prevent-getting-sick/disinfecting-your-home.html  If surfaces are dirty, clean them: Use detergent or soap and water prior to disinfection.  Then, use a household disinfectant. You can see a list of EPA-registered household disinfectants here. cdc.gov/coronavirus 05/26/2018 This information is not intended to replace advice given to you by your health care provider. Make sure you discuss any questions you have with your health care provider. Document Released: 05/05/2018 Document Revised: 06/03/2018 Document Reviewed: 05/05/2018 Elsevier Patient Education  2020 [employer]  

## 2018-08-20 NOTE — Patient Instructions (Signed)

## 2018-08-20 NOTE — Patient Instructions (Signed)
[facility] Discharge Instructions for Patients Receiving Chemotherapy  Today you received the following Immunotherapy: Keytruda  To help prevent nausea and vomiting after your treatment, we encourage you to take your nausea medication as directed by your MD.   If you develop nausea and vomiting that is not controlled by your nausea medication, call the clinic.   BELOW ARE SYMPTOMS THAT SHOULD BE REPORTED IMMEDIATELY:  *FEVER GREATER THAN 100.5 F  *CHILLS WITH OR WITHOUT FEVER  NAUSEA AND VOMITING THAT IS NOT CONTROLLED WITH YOUR NAUSEA MEDICATION  *UNUSUAL SHORTNESS OF BREATH  *UNUSUAL BRUISING OR BLEEDING  TENDERNESS IN MOUTH AND THROAT WITH OR WITHOUT PRESENCE OF ULCERS  *URINARY PROBLEMS  *BOWEL PROBLEMS  UNUSUAL RASH Items with * indicate a potential emergency and should be followed up as soon as possible.  Feel free to call the clinic should you have any questions or concerns. The clinic phone number is [phone].  Please show the [facility] at check-in to the Emergency Department and triage nurse.  Coronavirus (COVID-19) Are you at risk?  Are you at risk for the Coronavirus (COVID-19)?  To be considered HIGH RISK for Coronavirus (COVID-19), you have to meet the following criteria:  . Traveled to [place], [place], [place], [place] or [place]; or in the [place] to [place], [place], [place], or [place]; and have fever, cough, and shortness of breath within the last 2 weeks of travel OR . Been in close contact with a person diagnosed with COVID-19 within the last 2 weeks and have fever, cough, and shortness of breath . IF YOU DO NOT MEET THESE CRITERIA, YOU ARE CONSIDERED LOW RISK FOR COVID-19.  What to do if you are HIGH RISK for COVID-19?  Marland Kitchen If you are having a medical emergency, call 911. . Seek medical care right away. Before you go to a doctor's office, urgent care or emergency department, call ahead and tell them about your  recent travel, contact with someone diagnosed with COVID-19, and your symptoms. You should receive instructions from your physician's office regarding next steps of care.  . When you arrive at healthcare provider, tell the healthcare staff immediately you have returned from visiting [place], [place], [place], [place] or [place]; or traveled in the [place] to [place], [place], [place], or [place]; in the last two weeks or you have been in close contact with a person diagnosed with COVID-19 in the last 2 weeks.   . Tell the health care staff about your symptoms: fever, cough and shortness of breath. . After you have been seen by a medical provider, you will be either: o Tested for (COVID-19) and discharged home on quarantine except to seek medical care if symptoms worsen, and asked to  - Stay home and avoid contact with others until you get your results (4-5 days)  - Avoid travel on public transportation if possible (such as bus, train, or airplane) or o Sent to the Emergency Department by EMS for evaluation, COVID-19 testing, and possible admission depending on your condition and test results.  What to do if you are LOW RISK for COVID-19?  Reduce your risk of any infection by using the same precautions used for avoiding the common cold or flu:  Marland Kitchen Wash your hands often with soap and warm water for at least 20 seconds.  If soap and water are not readily available, use an alcohol-based hand sanitizer with at least 60% alcohol.  . If coughing  or sneezing, cover your mouth and nose by coughing or sneezing into the elbow areas of your shirt or coat, into a tissue or into your sleeve (not your hands). . Avoid shaking hands with others and consider head nods or verbal greetings only. . Avoid touching your eyes, nose, or mouth with unwashed hands.  . Avoid close contact with people who are sick. . Avoid places or events with large numbers of people in one location, like concerts or sporting  events. . Carefully consider travel plans you have or are making. . If you are planning any travel outside or inside the [place], visit the CDC's Travelers' Health webpage for the latest health notices. . If you have some symptoms but not all symptoms, continue to monitor at home and seek medical attention if your symptoms worsen. . If you are having a medical emergency, call 911.   [facility] / e-Visit: [email or web address]         [facility]: [facility]: [phone]                   [facility]: [phone]

## 2018-08-20 NOTE — Progress Notes (Signed)
Per Dr. Earlie Server, patient is OK to treat with HR and today's labs

## 2018-09-10 ENCOUNTER — Inpatient Hospital Stay: Payer: Medicare HMO

## 2018-09-10 ENCOUNTER — Inpatient Hospital Stay: Payer: Medicare HMO | Attending: Internal Medicine | Admitting: Physician Assistant

## 2018-09-10 ENCOUNTER — Encounter: Payer: Self-pay | Admitting: Physician Assistant

## 2018-09-10 ENCOUNTER — Other Ambulatory Visit: Payer: Self-pay

## 2018-09-10 ENCOUNTER — Telehealth: Payer: Self-pay | Admitting: Internal Medicine

## 2018-09-10 VITALS — BP 98/82 | HR 78 | Temp 98.5°F | Resp 18 | Ht 67.0 in | Wt 103.3 lb

## 2018-09-10 DIAGNOSIS — Z5112 Encounter for antineoplastic immunotherapy: Secondary | ICD-10-CM | POA: Diagnosis not present

## 2018-09-10 DIAGNOSIS — C3490 Malignant neoplasm of unspecified part of unspecified bronchus or lung: Secondary | ICD-10-CM

## 2018-09-10 DIAGNOSIS — Z95828 Presence of other vascular implants and grafts: Secondary | ICD-10-CM

## 2018-09-10 DIAGNOSIS — C3491 Malignant neoplasm of unspecified part of right bronchus or lung: Secondary | ICD-10-CM

## 2018-09-10 DIAGNOSIS — C3411 Malignant neoplasm of upper lobe, right bronchus or lung: Secondary | ICD-10-CM | POA: Diagnosis not present

## 2018-09-10 DIAGNOSIS — R197 Diarrhea, unspecified: Secondary | ICD-10-CM | POA: Diagnosis not present

## 2018-09-10 DIAGNOSIS — C784 Secondary malignant neoplasm of small intestine: Secondary | ICD-10-CM | POA: Diagnosis not present

## 2018-09-10 LAB — CMP (CANCER CENTER ONLY)
ALT: 54 U/L — ABNORMAL HIGH (ref 0–44)
AST: 58 U/L — ABNORMAL HIGH (ref 15–41)
Albumin: 3.5 g/dL (ref 3.5–5.0)
Alkaline Phosphatase: 136 U/L — ABNORMAL HIGH (ref 38–126)
Anion gap: 9 (ref 5–15)
BUN: 4 mg/dL — ABNORMAL LOW (ref 6–20)
CO2: 24 mmol/L (ref 22–32)
Calcium: 8.6 mg/dL — ABNORMAL LOW (ref 8.9–10.3)
Chloride: 104 mmol/L (ref 98–111)
Creatinine: 0.82 mg/dL (ref 0.61–1.24)
GFR, Est AFR Am: >60 mL/min (ref >60)
GFR, Estimated: >60 mL/min (ref >60)
Glucose, Bld: 232 mg/dL — ABNORMAL HIGH (ref 70–99)
Potassium: 4.1 mmol/L (ref 3.5–5.1)
Sodium: 137 mmol/L (ref 135–145)
Total Bilirubin: 0.6 mg/dL (ref 0.3–1.2)
Total Protein: 6.4 g/dL — ABNORMAL LOW (ref 6.5–8.1)

## 2018-09-10 LAB — CBC WITH DIFFERENTIAL (CANCER CENTER ONLY)
Abs Immature Granulocytes: 0.04 10*3/uL (ref 0.00–0.07)
Basophils Absolute: 0 10*3/uL (ref 0.0–0.1)
Basophils Relative: 1 %
Eosinophils Absolute: 0.1 10*3/uL (ref 0.0–0.5)
Eosinophils Relative: 2 %
HCT: 42.5 % (ref 39.0–52.0)
Hemoglobin: 14.2 g/dL (ref 13.0–17.0)
Immature Granulocytes: 1 %
Lymphocytes Relative: 24 %
Lymphs Abs: 1.6 10*3/uL (ref 0.7–4.0)
MCH: 30.9 pg (ref 26.0–34.0)
MCHC: 33.4 g/dL (ref 30.0–36.0)
MCV: 92.4 fL (ref 80.0–100.0)
Monocytes Absolute: 0.7 10*3/uL (ref 0.1–1.0)
Monocytes Relative: 11 %
Neutro Abs: 4.1 10*3/uL (ref 1.7–7.7)
Neutrophils Relative %: 61 %
Platelet Count: 220 10*3/uL (ref 150–400)
RBC: 4.6 MIL/uL (ref 4.22–5.81)
RDW: 13 % (ref 11.5–15.5)
WBC Count: 6.5 10*3/uL (ref 4.0–10.5)
nRBC: 0 % (ref 0.0–0.2)

## 2018-09-10 MED ORDER — SODIUM CHLORIDE 0.9 % IV SOLN
Freq: Once | INTRAVENOUS | Status: AC
  Administered 2018-09-10: 10:00:00 via INTRAVENOUS
  Filled 2018-09-10: qty 250

## 2018-09-10 MED ORDER — SODIUM CHLORIDE 0.9 % IV SOLN
200.0000 mg | Freq: Once | INTRAVENOUS | Status: AC
  Administered 2018-09-10: 200 mg via INTRAVENOUS
  Filled 2018-09-10: qty 8

## 2018-09-10 MED ORDER — SODIUM CHLORIDE 0.9% FLUSH
10.0000 mL | Freq: Once | INTRAVENOUS | Status: AC
  Administered 2018-09-10: 10 mL
  Filled 2018-09-10: qty 10

## 2018-09-10 MED ORDER — HEPARIN SOD (PORK) LOCK FLUSH 100 UNIT/ML IV SOLN
500.0000 [IU] | Freq: Once | INTRAVENOUS | Status: AC | PRN
  Administered 2018-09-10: 11:00:00 500 [IU]
  Filled 2018-09-10: qty 5

## 2018-09-10 MED ORDER — SODIUM CHLORIDE 0.9% FLUSH
10.0000 mL | INTRAVENOUS | Status: DC | PRN
  Administered 2018-09-10: 10 mL
  Filled 2018-09-10: qty 10

## 2018-09-10 NOTE — Progress Notes (Signed)
Brett [name] OFFICE PROGRESS NOTE  Sid Falcon, MD [place] [address]  DIAGNOSIS: Stage IV (T1b, N0, M1b) non-small cell lung cancer, adenocarcinoma presented with right upper lobe lung nodule and recent metastasis to the small intestine. This was initially diagnosed in September 2017.  Genomic Alterations Identified? ERBB2 amplification - equivocal? CDKN2A p16INK4a E88* and p14ARF N053Z SMARCA4 splice site [id number]>WI SPTA1 E2022* TOP2A amplification TP53 A159P Additional Findings? Microsatellite status MS-Stable Tumor Mutation Burden TMB-Intermediate; 18 Muts/Mb Additional Disease-relevant Genes with No Reportable Alterations Identified? EGFR KRAS ALK BRAF MET RET ROS1   PRIOR THERAPY: 1) Status post right VATS with right upper lobectomy and mediastinal lymph node dissection under the care of Dr. Roxan Hockey on 10/18/2015 and the final pathology was consistent with stage IA (T1b, N0, MX). 2) upper endoscopy on 01/05/2016 showed normal esophagus, normal stomach but there was occasional mass around 3.0 CM in length circumferential nonobstructing in the jejunum. The final pathology was consistent with metastatic adenocarcinoma. 3) status post laparoscopic laparotomy and resection of proximal lesion and and distal jejunum/proximal ileum under the care of Dr. Hassell Done 1 02/27/2016. 3)  Systemic chemotherapy with carboplatin for AUC of 5 and Alimta 500 MG/M2 every 3 weeks. First dose 04/04/2016. Status post 2 cycles. Last dose was given 04/21/2016 discontinued secondary to disease progression.  CURRENT THERAPY: Second line immunotherapy with Keytruda 200 mg IV every3 weeks, first dose 05/30/2016. Status post 37 cycles.  INTERVAL HISTORY: Brett [name] 58 y.o. male returns to the clinic for a follow-up visit.  The patient is feeling well today without any concerning complaints except for continued diarrhea. He uses imodium which helps decrease his  episodes of loose stools from 10-15 per day to 3-5 per day. He denies any blood in the stool. He said that the consistency of his stool is "oily". This has been occurring frequently for several months. Otherwise he has been tolerating his treatment fairly well.  He denies any fever, chills, night sweats, or recent weight loss. However, the patient is underweight. He uses carnation breakfast and ensure. He states that he has an appetite and does eat meals per his usual dietary habits. He denies any chest pain, shortness of breath, cough, or hemoptysis.  He denies any nausea, vomiting, or constipation.  He denies any visual changes. He reports his usual chronic headaches secondary to a closed head injury/trauma that occurred in 2012. He denies any rashes or skin changes.  He is here today for evaluation before starting cycle #38.  MEDICAL HISTORY: Past Medical History:  Diagnosis Date  . Barrett's esophagus 07/01/2013   Without dysplasia on biopsy 09/03/2012. Repeat EGD recommended 08/2015  . Bilateral cataracts 02/13/2017  . Carotid artery stenosis 07/01/2013   Requiring right sided stent   . Chronic pain syndrome 07/01/2013  . Closed head injury with brief loss of consciousness ([place]) 07/22/2010   Head trapped in a hydraulic device at work.  Fracture of orbital bones on right and brief loss of consciousness per report.  . 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 There was likely a strong psycho-social overlay in regard to the cognitive deficits in the form of mood disorder with psychotic features and mixed anxiety symptomatology. His primary tested cognitive deficits are in the areas of attention, executi  . Daily headache "since 07/2010"   constantly  . Degenerative joint disease of  cervical spine 07/01/2013  . Dupuytren's contracture of both hands 04/08/2014  .  Encounter for antineoplastic chemotherapy 10/05/2015  . Encounter for antineoplastic immunotherapy 05/24/2016  . Erectile dysfunction associated with type 2 diabetes mellitus ([place]) 07/01/2013  . Fibromyalgia 07/01/2013  . Goals of care, counseling/discussion 03/28/2016  . History of blood transfusion 11/28/2015   "suppose to get his first today" (11/28/2015)  . Hyperlipidemia LDL goal < 100 07/01/2013  . Intractable hiccups 04/11/2016  . 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 Memory changes    "memory issues" from head injury  . Moderate protein-calorie malnutrition ([place]) 11/29/2015  . Osteoarthritis of right thumb 10/21/2014  . Peripheral vascular occlusive disease ([place]) 07/01/2013   Requiring 2 arterial stents above the left knee per report  . Pneumonia ~ 2006/2007  . Post traumatic stress disorder 07/01/2013  . Primary lung adenocarcinoma ([place]) dx'd 08/2015   "right lung"  . Severe major depression with psychotic features ([place]) 04/15/2011  . Tobacco abuse 07/01/2013  . Type 2 diabetes mellitus with vascular disease ([place]) 07/01/2013   Left lower extremity and right carotid stenting    ALLERGIES:  is allergic to gabapentin; lyrica [pregabalin]; jardiance [empagliflozin]; celebrex [celecoxib]; and contrast media [iodinated diagnostic agents].  MEDICATIONS:  Current Outpatient Medications  Medication Sig Dispense Refill  . ACCU-CHEK SOFTCLIX LANCETS lancets Use to test blood glucose 1-2 times daily. Dx Code E11.59 100 each 12  . aspirin EC 81 MG tablet Take 81 mg by mouth daily.     Marland Kitchen atorvastatin (LIPITOR) 40 MG tablet Take 1 tablet (40 mg total) by mouth daily. 90 tablet 3  . Blood Glucose Monitoring Suppl (ACCU-CHEK AVIVA PLUS) w/Device KIT Use to test blood glucose 1-2 times daily. Dx Code E11.59 1 kit 0  . glipiZIDE (GLUCOTROL) 10 MG tablet Take 1 tablet (10 mg total) by mouth 2 (two) times daily before a meal. 180 tablet 3  . glucose blood test strip USE TO TEST BLOOD GLUCOSE 1 TO 2 TIMES  DAILY 100 each 4  . insulin aspart (NOVOLOG) 100 UNIT/ML FlexPen Inject 5 Units into the skin daily. Just before dinner 3 mL 6  . Insulin Glargine (LANTUS) 100 UNIT/ML Solostar Pen Inject 20 Units into the skin daily. 15 mL 3  . Insulin Pen Needle (PEN NEEDLES) 32G X 4 MM MISC 1 pen by Does not apply route 2 (two) times daily. Dx Code E11.59 200 each 6  . levothyroxine (SYNTHROID) 50 MCG tablet Take 1 tablet (50 mcg total) by mouth daily before breakfast. 30 tablet 2  . lisinopril (ZESTRIL) 5 MG tablet Take 0.5 tablets (2.5 mg total) by mouth daily. 90 tablet 3  . loperamide (IMODIUM A-D) 2 MG tablet Take 2 mg by mouth 4 (four) times daily as needed for diarrhea or loose stools.    . metFORMIN (GLUCOPHAGE-XR) 500 MG 24 hr tablet Take 1 tablet (500 mg total) by mouth 2 (two) times daily. 180 tablet 3  . oxyCODONE-acetaminophen (PERCOCET) 10-325 MG tablet Take 1-2 tablets by mouth every 6 (six) hours as needed for pain. 240 tablet 0  . pantoprazole (PROTONIX) 40 MG tablet TAKE 1 TABLET(40 MG) BY MOUTH DAILY 90 tablet 1  . Probiotic Product (CULTURELLE IMMUNE DEFENSE) CAPS Take 1 capsule by mouth daily. 30 capsule 11  . prochlorperazine (COMPAZINE) 10 MG tablet Take 10 mg by mouth every 6 (six) hours as needed for nausea or vomiting.    . sitaGLIPtin (JANUVIA) 100 MG tablet Take 1 tablet (100 mg total) by  mouth daily. 90 tablet 3  . cyclobenzaprine (FLEXERIL) 10 MG tablet Take 1 tablet (10 mg total) by mouth 3 (three) times daily as needed for muscle spasms. (Patient not taking: Reported on 03/26/2018) 90 tablet 11  . diphenhydrAMINE (BENADRYL) 25 mg capsule Take 2 capsules (50 mg total) by mouth as directed. Take prior to CT scan as directed (Patient not taking: Reported on 12/03/2017) 30 capsule 0  . hydrOXYzine (ATARAX/VISTARIL) 10 MG tablet Take 1 tablet (10 mg total) by mouth 3 (three) times daily as needed for itching. (Patient not taking: Reported on 03/26/2018) 30 tablet 0  . lidocaine-prilocaine  (EMLA) cream Apply 1 application topically as needed. (Patient not taking: Reported on 09/10/2018) 30 g 0  . predniSONE (DELTASONE) 50 MG tablet TAKE 1 TABLET BY MOUTH AT 13 HOURS, 7 HOURS, AND 1 HOUR PRIOR TO SCAN (Patient not taking: Reported on 09/10/2018) 3 tablet 0   No current facility-administered medications for this visit.    Facility-Administered Medications Ordered in Other Visits  Medication Dose Route Frequency Provider Last Rate Last Dose  . heparin lock flush 100 unit/mL  500 Units Intracatheter Once PRN Curt Bears, MD      . pembrolizumab [facility]) 200 mg in sodium chloride 0.9 % 50 mL chemo infusion  200 mg Intravenous Once Curt Bears, MD      . sodium chloride flush (NS) 0.9 % injection 10 mL  10 mL Intracatheter PRN Curt Bears, MD   10 mL at 06/18/18 1333  . sodium chloride flush (NS) 0.9 % injection 10 mL  10 mL Intracatheter PRN Curt Bears, MD        SURGICAL HISTORY:  Past Surgical History:  Procedure Laterality Date  . CAROTID STENT Right ?2014  . COLONOSCOPY N/A 11/30/2015   Procedure: COLONOSCOPY;  Surgeon: Teena Irani, MD;  Location: [facility] ENDOSCOPY;  Service: Endoscopy;  Laterality: N/A;  . ESOPHAGOGASTRODUODENOSCOPY N/A 11/30/2015   Procedure: ESOPHAGOGASTRODUODENOSCOPY (EGD);  Surgeon: Teena Irani, MD;  Location[facility] ENDOSCOPY;  Service: Endoscopy;  Laterality: N/A;  . ESOPHAGOGASTRODUODENOSCOPY (EGD) WITH PROPOFOL N/A 01/05/2016   Procedure: ESOPHAGOGASTRODUODENOSCOPY (EGD) WITH PROPOFOL;  Surgeon: Teena Irani, MD;  Location: [facility] ENDOSCOPY;  Service: Endoscopy;  Laterality: N/A;  . FEMORAL ARTERY STENT Left 05/2012; ~ 2015   Archie Endo 06/04/2012; Raechel Chute report  . FRACTURE SURGERY    . GIVENS CAPSULE STUDY N/A 12/22/2015   Procedure: GIVENS CAPSULE STUDY;  Surgeon: Wonda Horner, MD;  Location: [facility] ENDOSCOPY;  Service: Endoscopy;  Laterality: N/A;  . HARDWARE REMOVAL Right 11/15/2011   Removal of deep frontozygomatic orbital hardware/notes 11/15/2011   . HERNIA REPAIR  1610   Umbilical  . LAPAROSCOPY N/A 02/27/2016   Procedure: LAPAROSCOPY, LAPAROTOMY  WITH TWO SMALL BOWEL RESECTION;  Surgeon: Johnathan Hausen, MD;  Location: [facility] ORS;  Service: General;  Laterality: N/A;  . ORIF ORBITAL FRACTURE Right 08/15/2010    caught in a hydraulic machine; open reduction internal fixation of orbital rim fracture and open reduction of zygomatic arch fracture  Archie Endo 10/13/2009  . PORTACATH PLACEMENT Left 04/04/2016   Procedure: INSERTION PORT-A-CATH LEFT CHEST;  Surgeon: Melrose Nakayama, MD;  Location: [place];  Service: Thoracic;  Laterality: Left;  Marland Kitchen VIDEO ASSISTED THORACOSCOPY (VATS)/ LOBECTOMY Right 10/18/2015   Procedure: VIDEO ASSISTED THORACOSCOPY (VATS)/ LOBECTOMY;  Surgeon: Melrose Nakayama, MD;  Location: [facility];  Service: Thoracic;  Laterality: Right;  Marland Kitchen VIDEO BRONCHOSCOPY Bilateral 09/21/2015   Procedure: VIDEO BRONCHOSCOPY WITH FLUORO;  Surgeon: Juanito Doom, MD;  Location:  [facility] ENDOSCOPY;  Service: Cardiopulmonary;  Laterality: Bilateral;    REVIEW OF SYSTEMS:   Review of Systems  Constitutional: Negative for appetite change, chills, fatigue, fever and unexpected weight change.  HENT: Negative for mouth sores, nosebleeds, sore throat and trouble swallowing.   Eyes: Negative for eye problems and icterus.  Respiratory: Negative for cough, hemoptysis, shortness of breath and wheezing.   Cardiovascular: Negative for chest pain and leg swelling.  Gastrointestinal: Positive for diarrhea. Negative for abdominal pain, constipation, nausea and vomiting.  Genitourinary: Negative for bladder incontinence, difficulty urinating, dysuria, frequency and hematuria.   Musculoskeletal: Negative for back pain, gait problem, neck pain and neck stiffness.  Skin: Negative for itching and rash.  Neurological: Positive for chronic headaches secondary to his head injury in 2012. Negative for dizziness, extremity weakness, gait problem,  light-headedness and  seizures.  Hematological: Negative for adenopathy. Does not bruise/bleed easily.  Psychiatric/Behavioral: Negative for confusion, depression and sleep disturbance. The patient is not nervous/anxious.     PHYSICAL EXAMINATION:  Blood pressure 98/82, pulse 78, temperature 98.5 F (36.9 C), temperature source Oral, resp. rate 18, height 5' 7"  (1.702 m), weight 103 lb 4.8 oz (46.9 kg), SpO2 99 %.  ECOG PERFORMANCE STATUS: 1 - Symptomatic but completely ambulatory  Physical Exam  Constitutional: Oriented to person, place, and time and thin and chronically ill appearing male and in no distress. No distress.  HENT:  Head: Normocephalic and atraumatic.  Mouth/Throat: Oropharynx is clear and moist. No oropharyngeal exudate.  Eyes: Conjunctivae are normal. Right eye exhibits no discharge. Left eye exhibits no discharge. No scleral icterus.  Neck: Normal range of motion. Neck supple.  Cardiovascular: Normal rate, regular rhythm, normal heart sounds and intact distal pulses.   Pulmonary/Chest: Effort normal and breath sounds normal. No respiratory distress. No wheezes. No rales.  Abdominal: Soft. Bowel sounds are hyperactive. Exhibits no distension and no mass. There is no tenderness.  Musculoskeletal: Normal range of motion. Exhibits no edema.  Lymphadenopathy:    No cervical adenopathy.  Neurological: Alert and oriented to person, place, and time. Exhibits normal muscle tone. Gait normal. Coordination normal.  Skin: Skin is warm and dry. No rash noted. Not diaphoretic. No erythema. No pallor.  Psychiatric: Mood, memory and judgment normal.  Vitals reviewed.  LABORATORY DATA: Lab Results  Component Value Date   WBC 6.5 09/10/2018   HGB 14.2 09/10/2018   HCT 42.5 09/10/2018   MCV 92.4 09/10/2018   PLT 220 09/10/2018      Chemistry      Component Value Date/Time   NA 137 09/10/2018 0822   NA 140 01/23/2017 0910   K 4.1 09/10/2018 0822   K 4.5 01/23/2017 0910   CL 104 09/10/2018 0822    CO2 24 09/10/2018 0822   CO2 28 01/23/2017 0910   BUN 4 (L) 09/10/2018 0822   BUN 8.1 01/23/2017 0910   CREATININE 0.82 09/10/2018 0822   CREATININE 0.8 01/23/2017 0910   GLU 398 (H) 08/08/2016 1257      Component Value Date/Time   CALCIUM 8.6 (L) 09/10/2018 0822   CALCIUM 9.6 01/23/2017 0910   ALKPHOS 136 (H) 09/10/2018 0822   ALKPHOS 113 01/23/2017 0910   AST 58 (H) 09/10/2018 0822   AST 11 01/23/2017 0910   ALT 54 (H) 09/10/2018 0822   ALT 12 01/23/2017 0910   BILITOT 0.6 09/10/2018 0822   BILITOT 0.40 01/23/2017 0910       RADIOGRAPHIC STUDIES:  Ct Chest W Contrast  Result  Date: 08/19/2018 CLINICAL DATA:  History of right lung cancer with small-bowel metastases. Keytruda therapy ongoing. Nausea. EXAM: CT CHEST, ABDOMEN, AND PELVIS WITH CONTRAST TECHNIQUE: Multidetector CT imaging of the chest, abdomen and pelvis was performed following the standard protocol during bolus administration of intravenous contrast. CONTRAST:  147m OMNIPAQUE IOHEXOL 300 MG/ML  SOLN COMPARISON:  CT 06/17/2018 and 02/19/2018. FINDINGS: CT CHEST FINDINGS Cardiovascular: Atherosclerosis of the aorta, great vessels and coronary arteries. Left subclavian Port-A-Cath extends to the upper SVC. No acute vascular findings. The heart size is normal. There is no pericardial effusion. Mediastinum/Nodes: There are no enlarged mediastinal, hilar or axillary lymph nodes. The thyroid gland, trachea and esophagus demonstrate no significant findings. Lungs/Pleura: Status post right upper lobe resection. There is no pleural effusion or pneumothorax. There are stable mild emphysematous changes. No suspicious pulmonary nodules. Musculoskeletal/Chest wall: No chest wall mass or suspicious osseous findings. CT ABDOMEN AND PELVIS FINDINGS Hepatobiliary: Diffuse hepatic steatosis. No focal lesion or abnormal enhancement. No evidence of gallstones, gallbladder wall thickening or biliary dilatation. Pancreas: Mildly atrophied. No  focal lesion, surrounding inflammation or ductal dilatation. Spleen: Normal in size without focal abnormality. Adrenals/Urinary Tract: Both adrenal glands appear normal. The kidneys, ureters and bladder appear normal. Stomach/Bowel: No evidence of bowel wall thickening, distention or surrounding inflammatory change. Stable appearance of the small bowel anastomoses. No recurrent focal mass lesion associated with the bowel. The appendix appears normal. There is moderate stool throughout the colon. Vascular/Lymphatic: Central mesenteric necrotic lymph node is again noted, measuring 3.2 x 1.8 cm on image 83/2. This previously measured 3.1 x 1.6 cm. There is a new centrally necrotic 1 cm left periaortic node on image 70/2. No other enlarged abdominopelvic lymph nodes. There is diffuse aortic and branch vessel atherosclerosis with stable high-grade focal stenosis involving the left external iliac artery (image 107/2). Reproductive: Stable appearance of the prostate gland with internal calcifications. Other: No ascites or peritoneal nodularity. Musculoskeletal: No acute or significant osseous findings. IMPRESSION: 1. The dominant mesenteric lymph node has not significantly changed in size from the most recent study, although there is a new mildly enlarged periaortic lymph node at the level of the renal veins. No other evidence of metastatic disease. 2. Diffuse hepatic steatosis without focal abnormality. 3. Stable postsurgical changes in the small bowel without evidence of recurrent small bowel lesion. 4. No evidence of metastatic disease in the chest. 5. Aortic Atherosclerosis (ICD10-I70.0). Focal high-grade stenosis of the left external iliac artery. Electronically Signed   By: WRichardean SaleM.D.   On: 08/19/2018 16:25   Ct Abdomen Pelvis W Contrast  Result Date: 08/19/2018 CLINICAL DATA:  History of right lung cancer with small-bowel metastases. Keytruda therapy ongoing. Nausea. EXAM: CT CHEST, ABDOMEN, AND PELVIS  WITH CONTRAST TECHNIQUE: Multidetector CT imaging of the chest, abdomen and pelvis was performed following the standard protocol during bolus administration of intravenous contrast. CONTRAST:  1078mOMNIPAQUE IOHEXOL 300 MG/ML  SOLN COMPARISON:  CT 06/17/2018 and 02/19/2018. FINDINGS: CT CHEST FINDINGS Cardiovascular: Atherosclerosis of the aorta, great vessels and coronary arteries. Left subclavian Port-A-Cath extends to the upper SVC. No acute vascular findings. The heart size is normal. There is no pericardial effusion. Mediastinum/Nodes: There are no enlarged mediastinal, hilar or axillary lymph nodes. The thyroid gland, trachea and esophagus demonstrate no significant findings. Lungs/Pleura: Status post right upper lobe resection. There is no pleural effusion or pneumothorax. There are stable mild emphysematous changes. No suspicious pulmonary nodules. Musculoskeletal/Chest wall: No chest wall mass or suspicious osseous findings.  CT ABDOMEN AND PELVIS FINDINGS Hepatobiliary: Diffuse hepatic steatosis. No focal lesion or abnormal enhancement. No evidence of gallstones, gallbladder wall thickening or biliary dilatation. Pancreas: Mildly atrophied. No focal lesion, surrounding inflammation or ductal dilatation. Spleen: Normal in size without focal abnormality. Adrenals/Urinary Tract: Both adrenal glands appear normal. The kidneys, ureters and bladder appear normal. Stomach/Bowel: No evidence of bowel wall thickening, distention or surrounding inflammatory change. Stable appearance of the small bowel anastomoses. No recurrent focal mass lesion associated with the bowel. The appendix appears normal. There is moderate stool throughout the colon. Vascular/Lymphatic: Central mesenteric necrotic lymph node is again noted, measuring 3.2 x 1.8 cm on image 83/2. This previously measured 3.1 x 1.6 cm. There is a new centrally necrotic 1 cm left periaortic node on image 70/2. No other enlarged abdominopelvic lymph nodes.  There is diffuse aortic and branch vessel atherosclerosis with stable high-grade focal stenosis involving the left external iliac artery (image 107/2). Reproductive: Stable appearance of the prostate gland with internal calcifications. Other: No ascites or peritoneal nodularity. Musculoskeletal: No acute or significant osseous findings. IMPRESSION: 1. The dominant mesenteric lymph node has not significantly changed in size from the most recent study, although there is a new mildly enlarged periaortic lymph node at the level of the renal veins. No other evidence of metastatic disease. 2. Diffuse hepatic steatosis without focal abnormality. 3. Stable postsurgical changes in the small bowel without evidence of recurrent small bowel lesion. 4. No evidence of metastatic disease in the chest. 5. Aortic Atherosclerosis (ICD10-I70.0). Focal high-grade stenosis of the left external iliac artery. Electronically Signed   By: Richardean Sale M.D.   On: 08/19/2018 16:25     ASSESSMENT/PLAN:  This is a very pleasant 58 year old Caucasian male with metastatic non-small cell lung cancer, adenocarcinoma.  He presented with a right upper lobe lung nodule. He was initially diagnosed in September 2017.  He is status post a right upper lobectomy with lymph node dissection.  Unfortunately he was found to have metastatic disease in the jejunum and terminal ileum.  He underwent a surgical resection of the proximal and distal jejunum as well as proximal ileum.  The final pathology was consistent with high-grade carcinoma.   The patient then underwent systemic chemotherapy with carboplatin and Alimta for 2 cycles.  This was discontinued secondary to disease progression. The patient is currently on second line immunotherapy with Keytruda 200 mg IV every 3 weeks.  He is status post 37 cycles.  He continues to tolerate treatment well without any adverse effects except for diarrhea.   The patient was seen with Dr. Julien Nordmann today.   We recommend he proceed with cycle #38 today.   We will see him back for follow-up visit in 3 weeks for evaluation before starting cycle #39.  The patient will continue to take his diabetes mediations as prescribed. He is followed by Dr. Daryll Drown for diabetes mellitis. The patient recently ran out of his metformin. He was advised to contact the office of the prescribing provider to have a refill sent to the pharmacy.   The patient was given a copy of the dosing information for imodium for optimal control of his diarrhea. If the patient's diarrhea is not controlled with optimal use of imodium, Dr. Julien Nordmann may consider giving the patient a break from treatment if his diarrhea continues to be significant or causing significant weight loss pending the results of his next CT scan.   The patient was advised to call immediately if he has any concerning  symptoms in the interval. The patient voices understanding of current disease status and treatment options and is in agreement with the current care plan. All questions were answered. The patient knows to call the clinic with any problems, questions or concerns. We can certainly see the patient much sooner if necessary  Orders Placed This Encounter  Procedures  . TSH    Standing Status:   Standing    Number of Occurrences:   22    Standing Expiration Date:   09/10/2019     Tobe Sos Heilingoetter, PA-C 09/10/18  ADDENDUM: Hematology/Oncology Attending: I had a face-to-face encounter with the patient today.  I recommended his care plan.  This is a very pleasant 58 years old white male with metastatic non-small cell lung cancer.  He is currently undergoing treatment with immunotherapy with Keytruda status post 37 cycles.  He has been tolerating this treatment well except for frequent episodes of diarrhea.  He does not take his Imodium as prescribed. We encouraged the patient to use his Imodium after every episodes of diarrhea for a maximum of 8 tablets  a day. He was also advised to call immediately if he has uncontrolled diarrhea more than 6 times a day. The patient will proceed with cycle #38 today as planned. 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 We will see him back for follow-up visit in 3 weeks for evaluation before the next cycle of his treatment. For the hyperglycemia he will receive 10 units of regular insulin today.  He was also advised to monitor his blood sugar closely at home. He was advised to call immediately if he has any concerning symptoms in the interval.  Disclaimer: This note was dictated with voice recognition software. Similar sounding words can inadvertently be transcribed and may be missed upon review. Eilleen Kempf, MD 09/10/18

## 2018-09-10 NOTE — Patient Instructions (Signed)
Oral: Initial: 4 mg, followed by 2 mg after each loose stool (maximum: 16 mg/day)

## 2018-09-10 NOTE — Telephone Encounter (Signed)
Scheduled appt per 8/20 los - added additional cycles - pt to get an updated schedule next visit.

## 2018-09-10 NOTE — Patient Instructions (Signed)
[facility] Discharge Instructions for Patients Receiving Chemotherapy  Today you received the following Immunotherapy: Pembrolizumab/Keytruda  To help prevent nausea and vomiting after your treatment, we encourage you to take your nausea medication as directed by your MD.   If you develop nausea and vomiting that is not controlled by your nausea medication, call the clinic.   BELOW ARE SYMPTOMS THAT SHOULD BE REPORTED IMMEDIATELY:  *FEVER GREATER THAN 100.5 F  *CHILLS WITH OR WITHOUT FEVER  NAUSEA AND VOMITING THAT IS NOT CONTROLLED WITH YOUR NAUSEA MEDICATION  *UNUSUAL SHORTNESS OF BREATH  *UNUSUAL BRUISING OR BLEEDING  TENDERNESS IN MOUTH AND THROAT WITH OR WITHOUT PRESENCE OF ULCERS  *URINARY PROBLEMS  *BOWEL PROBLEMS  UNUSUAL RASH Items with * indicate a potential emergency and should be followed up as soon as possible.  Feel free to call the clinic should you have any questions or concerns. The clinic phone number is [phone].  Please show the [place] at check-in to the Emergency Department and triage nurse.  Coronavirus (COVID-19) Are you at risk?  Are you at risk for the Coronavirus (COVID-19)?  To be considered HIGH RISK for Coronavirus (COVID-19), you have to meet the following criteria:  . Traveled to [place], [place], [place], [place] or [place]; or in the [place] to [place], [place], [place], or [place]; and have fever, cough, and shortness of breath within the last 2 weeks of travel OR . Been in close contact with a person diagnosed with COVID-19 within the last 2 weeks and have fever, cough, and shortness of breath . IF YOU DO NOT MEET THESE CRITERIA, YOU ARE CONSIDERED LOW RISK FOR COVID-19.  What to do if you are HIGH RISK for COVID-19?  Marland Kitchen If you are having a medical emergency, call 911. . Seek medical care right away. Before you go to a doctor's office, urgent care or emergency department, call ahead and tell  them about your recent travel, contact with someone diagnosed with COVID-19, and your symptoms. You should receive instructions from your physician's office regarding next steps of care.  . When you arrive at healthcare provider, tell the healthcare staff immediately you have returned from visiting [place], [place], [place], [place] or [place]; or traveled in the [place] to [place], [place], [place], or [place]; in the last two weeks or you have been in close contact with a person diagnosed with COVID-19 in the last 2 weeks.   . Tell the health care staff about your symptoms: fever, cough and shortness of breath. . After you have been seen by a medical provider, you will be either: o Tested for (COVID-19) and discharged home on quarantine except to seek medical care if symptoms worsen, and asked to  - Stay home and avoid contact with others until you get your results (4-5 days)  - Avoid travel on public transportation if possible (such as bus, train, or airplane) or o Sent to the Emergency Department by EMS for evaluation, COVID-19 testing, and possible admission depending on your condition and test results.  What to do if you are LOW RISK for COVID-19?  Reduce your risk of any infection by using the same precautions used for avoiding the common cold or flu:  Marland Kitchen Wash your hands often with soap and warm water for at least 20 seconds.  If soap and water are not readily available, use an alcohol-based hand sanitizer with at least 60% alcohol.  . If coughing  or sneezing, cover your mouth and nose by coughing or sneezing into the elbow areas of your shirt or coat, into a tissue or into your sleeve (not your hands). . Avoid shaking hands with others and consider head nods or verbal greetings only. . Avoid touching your eyes, nose, or mouth with unwashed hands.  . Avoid close contact with people who are sick. . Avoid places or events with large numbers of people in one location, like concerts or  sporting events. . Carefully consider travel plans you have or are making. . If you are planning any travel outside or inside the [place], visit the CDC's Travelers' Health webpage for the latest health notices. . If you have some symptoms but not all symptoms, continue to monitor at home and seek medical attention if your symptoms worsen. . If you are having a medical emergency, call 911.   [place] / e-Visit: [email or web address]         [facility]: [facility]: [phone]                   [facility]: [phone]

## 2018-09-30 ENCOUNTER — Other Ambulatory Visit: Payer: Self-pay | Admitting: Internal Medicine

## 2018-09-30 DIAGNOSIS — G894 Chronic pain syndrome: Secondary | ICD-10-CM

## 2018-09-30 MED ORDER — OXYCODONE-ACETAMINOPHEN 10-325 MG PO TABS: 1.0000 | ORAL_TABLET | Freq: Four times a day (QID) | ORAL | 0 refills | Status: DC | PRN

## 2018-09-30 NOTE — Telephone Encounter (Signed)
Refill request and the Patient's wife would like a call back about the pt's stomach issues/bathrroom issues.   oxyCODONE-acetaminophen (PERCOCET) 10-325 MG tablet  [id number] - [place], [place] - [address] AT [address]

## 2018-09-30 NOTE — Telephone Encounter (Signed)
Also tried to rtc to wife, rec'd message cannot complete at this time, hang up and call again later

## 2018-10-01 ENCOUNTER — Telehealth: Payer: Self-pay | Admitting: Physician Assistant

## 2018-10-01 ENCOUNTER — Inpatient Hospital Stay: Payer: Medicare HMO

## 2018-10-01 ENCOUNTER — Other Ambulatory Visit: Payer: Self-pay

## 2018-10-01 ENCOUNTER — Inpatient Hospital Stay: Payer: Medicare HMO | Attending: Internal Medicine | Admitting: Physician Assistant

## 2018-10-01 VITALS — BP 104/76 | HR 101 | Temp 98.9°F | Resp 18 | Ht 67.0 in | Wt 102.1 lb

## 2018-10-01 VITALS — HR 89

## 2018-10-01 DIAGNOSIS — E1165 Type 2 diabetes mellitus with hyperglycemia: Secondary | ICD-10-CM | POA: Insufficient documentation

## 2018-10-01 DIAGNOSIS — C3490 Malignant neoplasm of unspecified part of unspecified bronchus or lung: Secondary | ICD-10-CM

## 2018-10-01 DIAGNOSIS — Z5112 Encounter for antineoplastic immunotherapy: Secondary | ICD-10-CM

## 2018-10-01 DIAGNOSIS — C3491 Malignant neoplasm of unspecified part of right bronchus or lung: Secondary | ICD-10-CM | POA: Diagnosis not present

## 2018-10-01 DIAGNOSIS — C3411 Malignant neoplasm of upper lobe, right bronchus or lung: Secondary | ICD-10-CM | POA: Insufficient documentation

## 2018-10-01 DIAGNOSIS — E1159 Type 2 diabetes mellitus with other circulatory complications: Secondary | ICD-10-CM

## 2018-10-01 DIAGNOSIS — C784 Secondary malignant neoplasm of small intestine: Secondary | ICD-10-CM | POA: Diagnosis not present

## 2018-10-01 DIAGNOSIS — Z79899 Other long term (current) drug therapy: Secondary | ICD-10-CM | POA: Diagnosis not present

## 2018-10-01 DIAGNOSIS — Z95828 Presence of other vascular implants and grafts: Secondary | ICD-10-CM

## 2018-10-01 LAB — CBC WITH DIFFERENTIAL (CANCER CENTER ONLY)
Abs Immature Granulocytes: 0.05 10*3/uL (ref 0.00–0.07)
Basophils Absolute: 0 10*3/uL (ref 0.0–0.1)
Basophils Relative: 1 %
Eosinophils Absolute: 0.1 10*3/uL (ref 0.0–0.5)
Eosinophils Relative: 2 %
HCT: 42 % (ref 39.0–52.0)
Hemoglobin: 13.7 g/dL (ref 13.0–17.0)
Immature Granulocytes: 1 %
Lymphocytes Relative: 24 %
Lymphs Abs: 1.5 10*3/uL (ref 0.7–4.0)
MCH: 30.2 pg (ref 26.0–34.0)
MCHC: 32.6 g/dL (ref 30.0–36.0)
MCV: 92.7 fL (ref 80.0–100.0)
Monocytes Absolute: 0.5 10*3/uL (ref 0.1–1.0)
Monocytes Relative: 9 %
Neutro Abs: 3.9 10*3/uL (ref 1.7–7.7)
Neutrophils Relative %: 63 %
Platelet Count: 222 10*3/uL (ref 150–400)
RBC: 4.53 MIL/uL (ref 4.22–5.81)
RDW: 13 % (ref 11.5–15.5)
WBC Count: 6.1 10*3/uL (ref 4.0–10.5)
nRBC: 0 % (ref 0.0–0.2)

## 2018-10-01 LAB — CMP (CANCER CENTER ONLY)
ALT: 53 U/L — ABNORMAL HIGH (ref 0–44)
AST: 58 U/L — ABNORMAL HIGH (ref 15–41)
Albumin: 3.3 g/dL — ABNORMAL LOW (ref 3.5–5.0)
Alkaline Phosphatase: 169 U/L — ABNORMAL HIGH (ref 38–126)
Anion gap: 7 (ref 5–15)
BUN: <4 mg/dL — ABNORMAL LOW (ref 6–20)
CO2: 27 mmol/L (ref 22–32)
Calcium: 8.6 mg/dL — ABNORMAL LOW (ref 8.9–10.3)
Chloride: 102 mmol/L (ref 98–111)
Creatinine: 0.82 mg/dL (ref 0.61–1.24)
GFR, Est AFR Am: >60 mL/min (ref >60)
GFR, Estimated: >60 mL/min (ref >60)
Glucose, Bld: 428 mg/dL — ABNORMAL HIGH (ref 70–99)
Potassium: 4.2 mmol/L (ref 3.5–5.1)
Sodium: 136 mmol/L (ref 135–145)
Total Bilirubin: 0.5 mg/dL (ref 0.3–1.2)
Total Protein: 6.1 g/dL — ABNORMAL LOW (ref 6.5–8.1)

## 2018-10-01 LAB — GLUCOSE, CAPILLARY: Glucose-Capillary: 300 mg/dL — ABNORMAL HIGH (ref 70–99)

## 2018-10-01 LAB — TSH: TSH: 1.833 u[IU]/mL (ref 0.320–4.118)

## 2018-10-01 MED ORDER — SODIUM CHLORIDE 0.9 % IV SOLN
200.0000 mg | Freq: Once | INTRAVENOUS | Status: AC
  Administered 2018-10-01: 15:00:00 200 mg via INTRAVENOUS
  Filled 2018-10-01: qty 8

## 2018-10-01 MED ORDER — SODIUM CHLORIDE 0.9 % IV SOLN
Freq: Once | INTRAVENOUS | Status: AC
  Administered 2018-10-01: 14:00:00 via INTRAVENOUS
  Filled 2018-10-01: qty 250

## 2018-10-01 MED ORDER — SODIUM CHLORIDE 0.9% FLUSH
10.0000 mL | INTRAVENOUS | Status: DC | PRN
  Administered 2018-10-01: 16:00:00 10 mL
  Filled 2018-10-01: qty 10

## 2018-10-01 MED ORDER — SODIUM CHLORIDE 0.9% FLUSH
10.0000 mL | INTRAVENOUS | Status: DC | PRN
  Administered 2018-10-01: 10 mL
  Filled 2018-10-01: qty 10

## 2018-10-01 MED ORDER — INSULIN REGULAR HUMAN 100 UNIT/ML IJ SOLN
15.0000 [IU] | Freq: Once | INTRAMUSCULAR | Status: AC
  Administered 2018-10-01: 15 [IU] via SUBCUTANEOUS
  Filled 2018-10-01: qty 10

## 2018-10-01 MED ORDER — HEPARIN SOD (PORK) LOCK FLUSH 100 UNIT/ML IV SOLN
500.0000 [IU] | Freq: Once | INTRAVENOUS | Status: AC | PRN
  Administered 2018-10-01: 16:00:00 500 [IU]
  Filled 2018-10-01: qty 5

## 2018-10-01 NOTE — Telephone Encounter (Signed)
No 9/10 los.

## 2018-10-01 NOTE — Progress Notes (Signed)
[facility] OFFICE PROGRESS NOTE  Sid Falcon, MD [place] [address]  DIAGNOSIS: Stage IV (T1b, N0, M1b) non-small cell lung cancer, adenocarcinoma presented with right upper lobe lung nodule and recent metastasis to the small intestine. This was initially diagnosed in September 2017.  Genomic Alterations Identified? ERBB2 amplification -equivocal? CDKN2A p16INK4a E88* and p14ARF T654Y SMARCA4 splice site [id number]>XN SPTA1 E2022* TOP2A amplification TP53 A159P Additional Findings? Microsatellite status MS-Stable Tumor Mutation Burden TMB-Intermediate; 18 Muts/Mb Additional Disease-relevant Genes with No Reportable Alterations Identified? EGFR KRAS ALK BRAF MET RET ROS1    PRIOR THERAPY: 1) Status post right VATS with right upper lobectomy and mediastinal lymph node dissection under the care of Dr. Roxan Hockey on 10/18/2015 and the final pathology was consistent with stage IA (T1b, N0, MX). 2) upper endoscopy on 01/05/2016 showed normal esophagus, normal stomach but there was occasional mass around 3.0 CM in length circumferential nonobstructing in the jejunum. The final pathology was consistent with metastatic adenocarcinoma. 3) status post laparoscopic laparotomy and resection of proximal lesion and and distal jejunum/proximal ileum under the care of Dr. Hassell Done 1 02/27/2016. 3) Systemic chemotherapy with carboplatin for AUC of 5 and Alimta 500 MG/M2 every 3 weeks. First dose 04/04/2016. Status post 2 cycles. Last dose was given 04/21/2016 discontinued secondary to disease progression.  CURRENT THERAPY:  Second line immunotherapy with Keytruda 200 mg IV every3 weeks, first dose 05/30/2016. Status post 38cycles.  INTERVAL HISTORY: Brett [name] 58 y.o. male returns to the clinic today for a follow-up visit.  The patient is doing fair today without any concerning complaints except for continued diarrhea.  The patient takes Imodium which  he says sometimes helps decrease his episodes of diarrhea from 10-15/day down to about 3-6/day. When asked how the patient takes this medication, he mentioned that the medication is expensive and that he does not always take as much as he could in order to try to conserve the imodium. The patient notes some associated dry mouth and admits that he does not drink enough water. The patient denies any abdominal pain or overt blood in the stool. The patient characterizes his stool as being "oily" looking.     The patient is underweight but has not lost a significant amount of weight since his last appointment.  He uses Carnation breakfast and drinks Ensure as well.  He states that he does have an appetite and eats meals per his usual dietary habits. The patient also has diabetes which is not well controlled.  He has an appointment with his primary care provider on October 23, 2018 and is planning to discuss his diabetes medication with them at that time.   Otherwise, the patient is tolerating treatment well without any adverse side effects.  He denies any fever, chills, or night sweats.  He denies any chest pain, shortness of breath, cough, or hemoptysis.  He denies any nausea, vomiting, or constipation.  He denies any rashes or skin changes.  He reports his usual chronic headache secondary to a close head injury that occurred in 2012.  The patient is here today for evaluation before starting cycle #39.  MEDICAL HISTORY: Past Medical History:  Diagnosis Date  . Barrett's esophagus 07/01/2013   Without dysplasia on biopsy 09/03/2012. Repeat EGD recommended 08/2015  . Bilateral cataracts 02/13/2017  . Carotid artery stenosis 07/01/2013   Requiring right sided stent   . Chronic pain syndrome 07/01/2013  . Closed head injury with brief loss of consciousness (  [place]) 07/22/2010   Head trapped in a hydraulic device at work.  Fracture of orbital bones on right and brief loss of consciousness per report.  . Cognitive  disorder 04/15/2011   Neuropsychological evaluation (03/2010):  Identified a number of problem areas including cognitive and psychiatric symptoms following a TBI in July 2012. There was likely a strong psycho-social overlay in regard to the cognitive deficits in the form of mood disorder with psychotic features and mixed anxiety symptomatology. His primary tested cognitive deficits are in the areas of attention, executi  . Daily headache "since 07/2010"   constantly  . Degenerative joint disease of cervical spine 07/01/2013  . Dupuytren's contracture of both hands 04/08/2014  . Encounter for antineoplastic chemotherapy 10/05/2015  . Encounter for antineoplastic immunotherapy 05/24/2016  . Erectile dysfunction associated with type 2 diabetes mellitus ([place]) 07/01/2013  . Fibromyalgia 07/01/2013  . Goals of care, counseling/discussion 03/28/2016  . History of blood transfusion 11/28/2015   "suppose to get his first today" (11/28/2015)  . Hyperlipidemia LDL goal < 100 07/01/2013  . Intractable hiccups 04/11/2016  . 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 Memory changes    "memory issues" from head injury  . Moderate protein-calorie malnutrition ([place]) 11/29/2015  . Osteoarthritis of right thumb 10/21/2014  . Peripheral vascular occlusive disease ([place]) 07/01/2013   Requiring 2 arterial stents above the left knee per report  . Pneumonia ~ 2006/2007  . Post traumatic stress disorder 07/01/2013  . Primary lung adenocarcinoma ([place]) dx'd 08/2015   "right lung"  . Severe major depression with psychotic features ([place]) 04/15/2011  . Tobacco abuse 07/01/2013  . Type 2 diabetes mellitus with vascular disease ([place]) 07/01/2013   Left lower extremity and right carotid stenting    ALLERGIES:  is allergic to gabapentin; lyrica [pregabalin]; jardiance [empagliflozin]; celebrex [celecoxib]; and contrast media [iodinated diagnostic agents].  MEDICATIONS:  Current Outpatient Medications  Medication Sig Dispense Refill  .  ACCU-CHEK SOFTCLIX LANCETS lancets Use to test blood glucose 1-2 times daily. Dx Code E11.59 100 each 12  . aspirin EC 81 MG tablet Take 81 mg by mouth daily.     Marland Kitchen atorvastatin (LIPITOR) 40 MG tablet Take 1 tablet (40 mg total) by mouth daily. 90 tablet 3  . Blood Glucose Monitoring Suppl (ACCU-CHEK AVIVA PLUS) w/Device KIT Use to test blood glucose 1-2 times daily. Dx Code E11.59 1 kit 0  . cyclobenzaprine (FLEXERIL) 10 MG tablet Take 1 tablet (10 mg total) by mouth 3 (three) times daily as needed for muscle spasms. (Patient not taking: Reported on 03/26/2018) 90 tablet 11  . diphenhydrAMINE (BENADRYL) 25 mg capsule Take 2 capsules (50 mg total) by mouth as directed. Take prior to CT scan as directed (Patient not taking: Reported on 12/03/2017) 30 capsule 0  . glipiZIDE (GLUCOTROL) 10 MG tablet Take 1 tablet (10 mg total) by mouth 2 (two) times daily before a meal. 180 tablet 3  . glucose blood test strip USE TO TEST BLOOD GLUCOSE 1 TO 2 TIMES DAILY 100 each 4  . hydrOXYzine (ATARAX/VISTARIL) 10 MG tablet Take 1 tablet (10 mg total) by mouth 3 (three) times daily as needed for itching. (Patient not taking: Reported on 03/26/2018) 30 tablet 0  . insulin aspart (NOVOLOG) 100 UNIT/ML FlexPen Inject 5 Units into the skin daily. Just before dinner 3 mL 6  . Insulin Glargine (LANTUS) 100 UNIT/ML Solostar Pen Inject 20 Units into the skin daily. 15 mL 3  . Insulin Pen Needle (PEN NEEDLES) 32G X  4 MM MISC 1 pen by Does not apply route 2 (two) times daily. Dx Code E11.59 200 each 6  . levothyroxine (SYNTHROID) 50 MCG tablet Take 1 tablet (50 mcg total) by mouth daily before breakfast. 30 tablet 2  . lidocaine-prilocaine (EMLA) cream Apply 1 application topically as needed. (Patient not taking: Reported on 09/10/2018) 30 g 0  . lisinopril (ZESTRIL) 5 MG tablet Take 0.5 tablets (2.5 mg total) by mouth daily. 90 tablet 3  . loperamide (IMODIUM A-D) 2 MG tablet Take 2 mg by mouth 4 (four) times daily as needed for  diarrhea or loose stools.    . metFORMIN (GLUCOPHAGE-XR) 500 MG 24 hr tablet Take 1 tablet (500 mg total) by mouth 2 (two) times daily. 180 tablet 3  . oxyCODONE-acetaminophen (PERCOCET) 10-325 MG tablet Take 1-2 tablets by mouth every 6 (six) hours as needed for pain. 240 tablet 0  . pantoprazole (PROTONIX) 40 MG tablet TAKE 1 TABLET(40 MG) BY MOUTH DAILY 90 tablet 1  . predniSONE (DELTASONE) 50 MG tablet TAKE 1 TABLET BY MOUTH AT 13 HOURS, 7 HOURS, AND 1 HOUR PRIOR TO SCAN (Patient not taking: Reported on 09/10/2018) 3 tablet 0  . Probiotic Product (CULTURELLE IMMUNE DEFENSE) CAPS Take 1 capsule by mouth daily. 30 capsule 11  . prochlorperazine (COMPAZINE) 10 MG tablet Take 10 mg by mouth every 6 (six) hours as needed for nausea or vomiting.    . sitaGLIPtin (JANUVIA) 100 MG tablet Take 1 tablet (100 mg total) by mouth daily. 90 tablet 3   No current facility-administered medications for this visit.    Facility-Administered Medications Ordered in Other Visits  Medication Dose Route Frequency Provider Last Rate Last Dose  . sodium chloride flush (NS) 0.9 % injection 10 mL  10 mL Intracatheter PRN Curt Bears, MD   10 mL at 06/18/18 1333  . sodium chloride flush (NS) 0.9 % injection 10 mL  10 mL Intracatheter PRN Curt Bears, MD   10 mL at 10/01/18 1545    SURGICAL HISTORY:  Past Surgical History:  Procedure Laterality Date  . CAROTID STENT Right ?2014  . COLONOSCOPY N/A 11/30/2015   Procedure: COLONOSCOPY;  Surgeon: Teena Irani, MD;  Location: [facility] ENDOSCOPY;  Service: Endoscopy;  Laterality: N/A;  . ESOPHAGOGASTRODUODENOSCOPY N/A 11/30/2015   Procedure: ESOPHAGOGASTRODUODENOSCOPY (EGD);  Surgeon: Teena Irani, MD;  [facility] ENDOSCOPY;  Service: Endoscopy;  Laterality: N/A;  . ESOPHAGOGASTRODUODENOSCOPY (EGD) WITH PROPOFOL N/A 01/05/2016   Procedure: ESOPHAGOGASTRODUODENOSCOPY (EGD) WITH PROPOFOL;  Surgeon: Teena Irani, MD;  Location: [facility] ENDOSCOPY;  Service: Endoscopy;  Laterality:  N/A;  . FEMORAL ARTERY STENT Left 05/2012; ~ 2015   Archie Endo 06/04/2012; Raechel Chute report  . FRACTURE SURGERY    . GIVENS CAPSULE STUDY N/A 12/22/2015   Procedure: GIVENS CAPSULE STUDY;  Surgeon: Wonda Horner, MD;  Location: [facility] ENDOSCOPY;  Service: Endoscopy;  Laterality: N/A;  . HARDWARE REMOVAL Right 11/15/2011   Removal of deep frontozygomatic orbital hardware/notes 11/15/2011  . HERNIA REPAIR  [id number]   Umbilical  . LAPAROSCOPY N/A 02/27/2016   Procedure: LAPAROSCOPY, LAPAROTOMY  WITH TWO SMALL BOWEL RESECTION;  Surgeon: Johnathan Hausen, MD;  Location: [facility] ORS;  Service: General;  Laterality: N/A;  . ORIF ORBITAL FRACTURE Right 08/15/2010    caught in a hydraulic machine; open reduction internal fixation of orbital rim fracture and open reduction of zygomatic arch fracture  Archie Endo 10/13/2009  . PORTACATH PLACEMENT Left 04/04/2016   Procedure: INSERTION PORT-A-CATH LEFT CHEST;  Surgeon: Melrose Nakayama, MD;  Location: [facility] OR;  Service: Thoracic;  Laterality: Left;  Marland Kitchen VIDEO ASSISTED THORACOSCOPY (VATS)/ LOBECTOMY Right 10/18/2015   Procedure: VIDEO ASSISTED THORACOSCOPY (VATS)/ LOBECTOMY;  Surgeon: Melrose Nakayama, MD;  Location: [facility];  Service: Thoracic;  Laterality: Right;  Marland Kitchen VIDEO BRONCHOSCOPY Bilateral 09/21/2015   Procedure: VIDEO BRONCHOSCOPY WITH FLUORO;  Surgeon: Juanito Doom, MD;  Location: [facility] ENDOSCOPY;  Service: Cardiopulmonary;  Laterality: Bilateral;    REVIEW OF SYSTEMS:   Review of Systems  Constitutional: Negative for appetite change, chills, fatigue, fever and unexpected weight change.  HENT: Positive for dry mouth. Negative for mouth sores, nosebleeds, sore throat and trouble swallowing.   Eyes: Negative for eye problems and icterus.  Respiratory: Negative for cough, hemoptysis, shortness of breath and wheezing.   Cardiovascular: Negative for chest pain and leg swelling.  Gastrointestinal: Positive for diarrhea. Negative for abdominal pain, constipation, nausea and  vomiting.  Genitourinary: Negative for bladder incontinence, difficulty urinating, dysuria, frequency and hematuria.   Musculoskeletal: Negative for back pain, gait problem, neck pain and neck stiffness.  Skin: Negative for itching and rash.  Neurological: Positive for chronic headaches secondary to his head injury in 2012. Negative for dizziness, extremity weakness, gait problem,  light-headedness and seizures.  Hematological: Negative for adenopathy. Does not bruise/bleed easily.  Psychiatric/Behavioral: Negative for confusion, depression and sleep disturbance. The patient is not nervous/anxious.  PHYSICAL EXAMINATION:  Blood pressure 104/76, pulse (!) 101, temperature 98.9 F (37.2 C), temperature source Oral, resp. rate 18, height '5\' 7"'  (1.702 m), weight 102 lb 1.6 oz (46.3 kg), SpO2 100 %.  ECOG PERFORMANCE STATUS: 1 - Symptomatic but completely ambulatory  Physical Exam  Constitutional: Oriented to person, place, and time and thin and chronically ill appearing male and in no distress.  HENT:  Head: Normocephalic and atraumatic.  Mouth/Throat: Oropharynx is clear and dry.  Eyes: Conjunctivae are normal. Right eye exhibits no discharge. Left eye exhibits no discharge. No scleral icterus.  Neck: Normal range of motion. Neck supple.  Cardiovascular: Normal rate, regular rhythm, normal heart sounds and intact distal pulses.   Pulmonary/Chest: Effort normal and breath sounds normal. No respiratory distress. No wheezes. No rales.  Abdominal: Soft. Bowel sounds are normoactive. Exhibits no distension and no mass. There is no tenderness.  Musculoskeletal: Normal range of motion. Exhibits no edema.  Lymphadenopathy:    No cervical adenopathy.  Neurological: Alert and oriented to person, place, and time. Exhibits normal muscle tone. Gait normal. Coordination normal.  Skin: Skin is warm and dry. No rash noted. Not diaphoretic. No erythema. No pallor.  Psychiatric: Mood, memory and judgment  normal.  Vitals reviewed.  LABORATORY DATA: Lab Results  Component Value Date   WBC 6.1 10/01/2018   HGB 13.7 10/01/2018   HCT 42.0 10/01/2018   MCV 92.7 10/01/2018   PLT 222 10/01/2018      Chemistry      Component Value Date/Time   NA 136 10/01/2018 1257   NA 140 01/23/2017 0910   K 4.2 10/01/2018 1257   K 4.5 01/23/2017 0910   CL 102 10/01/2018 1257   CO2 27 10/01/2018 1257   CO2 28 01/23/2017 0910   BUN <4 (L) 10/01/2018 1257   BUN 8.1 01/23/2017 0910   CREATININE 0.82 10/01/2018 1257   CREATININE 0.8 01/23/2017 0910   GLU 398 (H) 08/08/2016 1257      Component Value Date/Time   CALCIUM 8.6 (L) 10/01/2018 1257   CALCIUM 9.6 01/23/2017 0910   ALKPHOS 169 (H) 10/01/2018  1257   ALKPHOS 113 01/23/2017 0910   AST 58 (H) 10/01/2018 1257   AST 11 01/23/2017 0910   ALT 53 (H) 10/01/2018 1257   ALT 12 01/23/2017 0910   BILITOT 0.5 10/01/2018 1257   BILITOT 0.40 01/23/2017 0910       RADIOGRAPHIC STUDIES:  No results found.   ASSESSMENT/PLAN:  This is a very pleasant 58 year old Caucasian male with metastatic non-small cell lung cancer, adenocarcinoma. He presented with a right upper lobe lung nodule.He was initially diagnosed in September 2017.  He is status post a right upper lobectomy with lymph node dissection. Unfortunately he was found to have metastatic disease in the jejunum and terminal ileum. He underwent a surgical resection of the proximal and distal jejunum as well as proximal ileum. The final pathology was consistent with high-grade carcinoma.  The patient then underwent systemic chemotherapy with carboplatin and Alimta for 2 cycles. This was discontinued secondary to disease progression. The patient is currently on second line immunotherapy with Keytruda 200 mg IV every 3 weeks. He is status post 38 cycles. He continues to tolerate treatment well without any adverse effects except for diarrhea.   Labs were reviewed with the patient, the  patient's blood sugar was noted to be elevated at 428. The patient will receive 15 units of insulin while in the clinic today.   The patient will receive cycle #39 today as scheduled.   We will see him back for follow-up visit in 3 weeks for evaluation before starting cycle #40.  The patient will continue to take his diabetes mediations as prescribed. He has a follow up appointment with his PCP on 10/23/2018. The patient was instructed to keep a log of his blood sugar at home so this can be discussed with his PCP at his next appointment. The signs and symptoms of hyper and hypo glycemia were reviewed with the patient in detail at prior appointments.  I reviewed with the patient the proper way to take imodium for control of his diarrhea.   The patient was advised to call immediately if he has any concerning symptoms in the interval. The patient voices understanding of current disease status and treatment options and is in agreement with the current care plan. All questions were answered. The patient knows to call the clinic with any problems, questions or concerns. We can certainly see the patient much sooner if necessary  No orders of the defined types were placed in this encounter.    Cassandra L Heilingoetter, PA-C 10/01/18

## 2018-10-01 NOTE — Patient Instructions (Signed)
[facility] Discharge Instructions for Patients Receiving Chemotherapy  Today you received the following chemotherapy agents:  Keytruda.  To help prevent nausea and vomiting after your treatment, we encourage you to take your nausea medication as directed.   If you develop nausea and vomiting that is not controlled by your nausea medication, call the clinic.   BELOW ARE SYMPTOMS THAT SHOULD BE REPORTED IMMEDIATELY:  *FEVER GREATER THAN 100.5 F  *CHILLS WITH OR WITHOUT FEVER  NAUSEA AND VOMITING THAT IS NOT CONTROLLED WITH YOUR NAUSEA MEDICATION  *UNUSUAL SHORTNESS OF BREATH  *UNUSUAL BRUISING OR BLEEDING  TENDERNESS IN MOUTH AND THROAT WITH OR WITHOUT PRESENCE OF ULCERS  *URINARY PROBLEMS  *BOWEL PROBLEMS  UNUSUAL RASH Items with * indicate a potential emergency and should be followed up as soon as possible.  Feel free to call the clinic should you have any questions or concerns. The clinic phone number is [phone].  Please show the CHEMO ALERT CARD at check-in to the Emergency Department and triage nurse.    

## 2018-10-22 ENCOUNTER — Other Ambulatory Visit: Payer: Self-pay

## 2018-10-22 ENCOUNTER — Inpatient Hospital Stay: Payer: Medicare HMO

## 2018-10-22 ENCOUNTER — Inpatient Hospital Stay (HOSPITAL_BASED_OUTPATIENT_CLINIC_OR_DEPARTMENT_OTHER): Payer: Medicare HMO | Admitting: Internal Medicine

## 2018-10-22 ENCOUNTER — Inpatient Hospital Stay: Payer: Medicare HMO | Attending: Internal Medicine

## 2018-10-22 ENCOUNTER — Encounter: Payer: Self-pay | Admitting: Internal Medicine

## 2018-10-22 VITALS — HR 94

## 2018-10-22 DIAGNOSIS — C3411 Malignant neoplasm of upper lobe, right bronchus or lung: Secondary | ICD-10-CM | POA: Insufficient documentation

## 2018-10-22 DIAGNOSIS — C3490 Malignant neoplasm of unspecified part of unspecified bronchus or lung: Secondary | ICD-10-CM | POA: Diagnosis not present

## 2018-10-22 DIAGNOSIS — C784 Secondary malignant neoplasm of small intestine: Secondary | ICD-10-CM | POA: Diagnosis not present

## 2018-10-22 DIAGNOSIS — Z79899 Other long term (current) drug therapy: Secondary | ICD-10-CM | POA: Diagnosis not present

## 2018-10-22 DIAGNOSIS — Z5189 Encounter for other specified aftercare: Secondary | ICD-10-CM | POA: Insufficient documentation

## 2018-10-22 DIAGNOSIS — Z5111 Encounter for antineoplastic chemotherapy: Secondary | ICD-10-CM | POA: Diagnosis not present

## 2018-10-22 DIAGNOSIS — C3491 Malignant neoplasm of unspecified part of right bronchus or lung: Secondary | ICD-10-CM

## 2018-10-22 DIAGNOSIS — Z5112 Encounter for antineoplastic immunotherapy: Secondary | ICD-10-CM

## 2018-10-22 DIAGNOSIS — Z95828 Presence of other vascular implants and grafts: Secondary | ICD-10-CM

## 2018-10-22 LAB — CMP (CANCER CENTER ONLY)
ALT: 54 U/L — ABNORMAL HIGH (ref 0–44)
AST: 61 U/L — ABNORMAL HIGH (ref 15–41)
Albumin: 3.2 g/dL — ABNORMAL LOW (ref 3.5–5.0)
Alkaline Phosphatase: 159 U/L — ABNORMAL HIGH (ref 38–126)
Anion gap: 8 (ref 5–15)
BUN: <4 mg/dL — ABNORMAL LOW (ref 6–20)
CO2: 27 mmol/L (ref 22–32)
Calcium: 8.6 mg/dL — ABNORMAL LOW (ref 8.9–10.3)
Chloride: 104 mmol/L (ref 98–111)
Creatinine: 0.71 mg/dL (ref 0.61–1.24)
GFR, Est AFR Am: >60 mL/min (ref >60)
GFR, Estimated: >60 mL/min (ref >60)
Glucose, Bld: 222 mg/dL — ABNORMAL HIGH (ref 70–99)
Potassium: 3.9 mmol/L (ref 3.5–5.1)
Sodium: 139 mmol/L (ref 135–145)
Total Bilirubin: 0.5 mg/dL (ref 0.3–1.2)
Total Protein: 6.2 g/dL — ABNORMAL LOW (ref 6.5–8.1)

## 2018-10-22 LAB — CBC WITH DIFFERENTIAL (CANCER CENTER ONLY)
Abs Immature Granulocytes: 0.06 10*3/uL (ref 0.00–0.07)
Basophils Absolute: 0.1 10*3/uL (ref 0.0–0.1)
Basophils Relative: 1 %
Eosinophils Absolute: 0.1 10*3/uL (ref 0.0–0.5)
Eosinophils Relative: 2 %
HCT: 41.7 % (ref 39.0–52.0)
Hemoglobin: 13.8 g/dL (ref 13.0–17.0)
Immature Granulocytes: 1 %
Lymphocytes Relative: 25 %
Lymphs Abs: 1.5 10*3/uL (ref 0.7–4.0)
MCH: 30.9 pg (ref 26.0–34.0)
MCHC: 33.1 g/dL (ref 30.0–36.0)
MCV: 93.3 fL (ref 80.0–100.0)
Monocytes Absolute: 0.7 10*3/uL (ref 0.1–1.0)
Monocytes Relative: 11 %
Neutro Abs: 3.7 10*3/uL (ref 1.7–7.7)
Neutrophils Relative %: 60 %
Platelet Count: 225 10*3/uL (ref 150–400)
RBC: 4.47 MIL/uL (ref 4.22–5.81)
RDW: 13 % (ref 11.5–15.5)
WBC Count: 6 10*3/uL (ref 4.0–10.5)
nRBC: 0 % (ref 0.0–0.2)

## 2018-10-22 LAB — TSH: TSH: 6.424 u[IU]/mL — ABNORMAL HIGH (ref 0.320–4.118)

## 2018-10-22 MED ORDER — SODIUM CHLORIDE 0.9% FLUSH
10.0000 mL | INTRAVENOUS | Status: DC | PRN
  Administered 2018-10-22: 10 mL
  Filled 2018-10-22: qty 10

## 2018-10-22 MED ORDER — HEPARIN SOD (PORK) LOCK FLUSH 100 UNIT/ML IV SOLN
500.0000 [IU] | Freq: Once | INTRAVENOUS | Status: AC | PRN
  Administered 2018-10-22: 500 [IU]
  Filled 2018-10-22: qty 5

## 2018-10-22 MED ORDER — SODIUM CHLORIDE 0.9 % IV SOLN
Freq: Once | INTRAVENOUS | Status: AC
  Administered 2018-10-22: 10:00:00 via INTRAVENOUS
  Filled 2018-10-22: qty 250

## 2018-10-22 MED ORDER — SODIUM CHLORIDE 0.9 % IV SOLN
200.0000 mg | Freq: Once | INTRAVENOUS | Status: AC
  Administered 2018-10-22: 200 mg via INTRAVENOUS
  Filled 2018-10-22: qty 8

## 2018-10-22 NOTE — Progress Notes (Signed)
[place] [phone]   [phone]) [phone]  OFFICE PROGRESS NOTE  Sid [name], Brett [name], Brett [name], Brett [name], Brett [name].  Genomic Alterations Identified? ERBB2 amplification - equivocal? CDKN2A p16INK4a E88* and p14ARF Y706C SMARCA4 splice site [id number]>HY SPTA1 E2022* TOP2A amplification TP53 A159P Additional Findings? Microsatellite status MS-Stable Tumor Mutation Burden TMB-Intermediate; 18 Muts/Mb Additional Disease-relevant Genes with No Reportable Alterations Identified? EGFR KRAS ALK BRAF MET RET ROS1   PRIOR THERAPY:  1) Status post right VATS with right upper lobectomy and mediastinal lymph node dissection under the care of Dr. Roxan Hockey on 09/27/[name] and the final pathology was consistent with stage IA (T1b, Brett [name], MX). 2) upper endoscopy on 12/15/[name] showed normal esophagus, normal stomach but there was occasional mass around 3.0 CM in length circumferential nonobstructing in the jejunum. The final pathology was consistent with metastatic Brett. 3) status post laparoscopic laparotomy and resection of proximal lesion and and distal jejunum/proximal ileum under the care of Dr. Hassell Done 1 02/27/2016. 3)  Systemic chemotherapy with carboplatin for AUC of 5 and Alimta 500 MG/M2 every 3 weeks. First dose 04/04/2016. Status post 2 cycles. Last dose was given 04/21/2016 discontinued secondary to disease progression.   CURRENT THERAPY: Second line immunotherapy with Ketruda 200 mg IV every 2 weeks, first dose 05/30/2016. Status post 39 cycles.  INTERVAL HISTORY: AL BRACEWELL 58 y.o. male returns to the clinic today for follow-up visit.  The patient is feeling fine today with no concerning complaints except for few  episodes of diarrhea.  He takes Imodium on as-needed basis.  He denied having any chest pain, shortness of breath, cough or hemoptysis.  He denied having any nausea, vomiting or constipation.  The patient has no recent weight loss or night sweats.  He has no headache or visual changes.  He continues to tolerate his treatment with Keytruda fairly well.  He is here for evaluation before starting cycle #40.  MEDICAL HISTORY: Past Medical History:  Diagnosis Date  . Barrett's esophagus 07/01/2013   Without dysplasia on biopsy 09/03/2012. Repeat EGD recommended 8/[name]  . Bilateral cataracts 02/13/2017  . Carotid artery stenosis 07/01/2013   Requiring right sided stent   . Chronic pain syndrome 07/01/2013  . Closed head injury with brief loss of consciousness ([place]) 07/22/2010   Head trapped in a hydraulic device at work.  Fracture of orbital bones on right and brief loss of consciousness per report.  . 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 There was likely a strong psycho-social overlay in regard to the cognitive deficits in the form of mood disorder with psychotic features and mixed anxiety symptomatology. His primary tested cognitive deficits are in the areas of attention, executi  . Daily headache "since 07/2010"   constantly  . Degenerative joint disease of cervical spine 07/01/2013  . Dupuytren's contracture of both hands 04/08/2014  . Encounter for antineoplastic chemotherapy 9/14/[name]  . Encounter for antineoplastic immunotherapy 05/24/2016  . Erectile dysfunction associated with type 2 diabetes mellitus ([place]) 07/01/2013  . Fibromyalgia 07/01/2013  . Goals of care, counseling/discussion 03/28/2016  . History of blood transfusion 11/07/[name]   "suppose to get his first today" (11/7/[name])  .  Hyperlipidemia LDL goal < 100 07/01/2013  . Intractable hiccups  04/11/2016  . Jejunal Brett ([place]) 02/13/2016  . Memory changes    "memory issues" from head injury  . Moderate protein-calorie malnutrition ([place]) 11/8/[name]  . Osteoarthritis of right thumb 10/21/2014  . Peripheral vascular occlusive disease ([place]) 07/01/2013   Requiring 2 arterial stents above the left knee per report  . Pneumonia ~ 2006/2007  . Post traumatic stress disorder 07/01/2013  . Primary lung Brett ([place]) dx'd 8/[name]   "right lung"  . Severe major depression with psychotic features ([place]) 04/15/2011  . Tobacco abuse 07/01/2013  . Type 2 diabetes mellitus with vascular disease ([place]) 07/01/2013   Left lower extremity and right carotid stenting    ALLERGIES:  is allergic to gabapentin; lyrica [pregabalin]; jardiance [empagliflozin]; celebrex [celecoxib]; and contrast media [iodinated diagnostic agents].  MEDICATIONS:  Current Outpatient Medications  Medication Sig Dispense Refill  . ACCU-CHEK SOFTCLIX LANCETS lancets Use to test blood glucose 1-2 times daily. Dx Code E11.59 100 each 12  . aspirin EC 81 MG tablet Take 81 mg by mouth daily.     Marland Kitchen atorvastatin (LIPITOR) 40 MG tablet Take 1 tablet (40 mg total) by mouth daily. 90 tablet 3  . Blood Glucose Monitoring Suppl (ACCU-CHEK AVIVA PLUS) w/Device KIT Use to test blood glucose 1-2 times daily. Dx Code E11.59 1 kit 0  . cyclobenzaprine (FLEXERIL) 10 MG tablet Take 1 tablet (10 mg total) by mouth 3 (three) times daily as needed for muscle spasms. (Patient not taking: Reported on 03/26/2018) 90 tablet 11  . diphenhydrAMINE (BENADRYL) 25 mg capsule Take 2 capsules (50 mg total) by mouth as directed. Take prior to CT scan as directed (Patient not taking: Reported on 12/03/2017) 30 capsule 0  . glipiZIDE (GLUCOTROL) 10 MG tablet Take 1 tablet (10 mg total) by mouth 2 (two) times daily before a meal. 180 tablet 3  . glucose blood test strip USE TO TEST BLOOD GLUCOSE 1 TO 2 TIMES DAILY 100 each 4  . hydrOXYzine (ATARAX/VISTARIL)  10 MG tablet Take 1 tablet (10 mg total) by mouth 3 (three) times daily as needed for itching. (Patient not taking: Reported on 03/26/2018) 30 tablet 0  . insulin aspart (NOVOLOG) 100 UNIT/ML FlexPen Inject 5 Units into the skin daily. Just before dinner 3 mL 6  . Insulin Glargine (LANTUS) 100 UNIT/ML Solostar Pen Inject 20 Units into the skin daily. 15 mL 3  . Insulin Pen Needle (PEN NEEDLES) 32G X 4 MM MISC 1 pen by Does not apply route 2 (two) times daily. Dx Code E11.59 200 each 6  . levothyroxine (SYNTHROID) 50 MCG tablet Take 1 tablet (50 mcg total) by mouth daily before breakfast. 30 tablet 2  . lidocaine-prilocaine (EMLA) cream Apply 1 application topically as needed. (Patient not taking: Reported on 09/10/2018) 30 g 0  . lisinopril (ZESTRIL) 5 MG tablet Take 0.5 tablets (2.5 mg total) by mouth daily. 90 tablet 3  . loperamide (IMODIUM A-D) 2 MG tablet Take 2 mg by mouth 4 (four) times daily as needed for diarrhea or loose stools.    . metFORMIN (GLUCOPHAGE-XR) 500 MG 24 hr tablet Take 1 tablet (500 mg total) by mouth 2 (two) times daily. 180 tablet 3  . oxyCODONE-acetaminophen (PERCOCET) 10-325 MG tablet Take 1-2 tablets by mouth every 6 (six) hours as needed for pain. 240 tablet 0  . pantoprazole (PROTONIX) 40 MG tablet TAKE 1 TABLET(40 MG) BY MOUTH DAILY 90 tablet 1  . predniSONE (  DELTASONE) 50 MG tablet TAKE 1 TABLET BY MOUTH AT 13 HOURS, 7 HOURS, AND 1 HOUR PRIOR TO SCAN (Patient not taking: Reported on 09/10/2018) 3 tablet 0  . Probiotic Product (CULTURELLE IMMUNE DEFENSE) CAPS Take 1 capsule by mouth daily. 30 capsule 11  . prochlorperazine (COMPAZINE) 10 MG tablet Take 10 mg by mouth every 6 (six) hours as needed for nausea or vomiting.    . sitaGLIPtin (JANUVIA) 100 MG tablet Take 1 tablet (100 mg total) by mouth daily. 90 tablet 3   No current facility-administered medications for this visit.    Facility-Administered Medications Ordered in Other Visits  Medication Dose Route  Frequency Provider Last Rate Last Dose  . sodium chloride flush (NS) 0.9 % injection 10 mL  10 mL Intracatheter PRN Curt Bears, Brett   10 mL at 06/18/18 1333    SURGICAL HISTORY:  Past Surgical History:  Procedure Laterality Date  . CAROTID STENT Right ?2014  . COLONOSCOPY N/A 11/9/[name]   Procedure: COLONOSCOPY;  Surgeon: Teena Irani, Brett;  Location: [facility] ENDOSCOPY;  Service: Endoscopy;  Laterality: N/A;  . ESOPHAGOGASTRODUODENOSCOPY N/A 11/9/[name]   Procedure: ESOPHAGOGASTRODUODENOSCOPY (EGD);  Surgeon: Teena Irani, Brett;  Location: [facility] ENDOSCOPY;  Service: Endoscopy;  Laterality: N/A;  . ESOPHAGOGASTRODUODENOSCOPY (EGD) WITH PROPOFOL N/A 12/15/[name]   Procedure: ESOPHAGOGASTRODUODENOSCOPY (EGD) WITH PROPOFOL;  Surgeon: Teena Irani, Brett;  Location: [facility] ENDOSCOPY;  Service: Endoscopy;  Laterality: N/A;  . FEMORAL ARTERY STENT Left 05/2012; ~ 2015   Archie Endo 06/04/2012; Raechel Chute report  . FRACTURE SURGERY    . GIVENS CAPSULE STUDY N/A 12/1/[name]   Procedure: GIVENS CAPSULE STUDY;  Surgeon: Wonda Horner, Brett;  [facility] ENDOSCOPY;  Service: Endoscopy;  Laterality: N/A;  . HARDWARE REMOVAL Right 11/15/2011   Removal of deep frontozygomatic orbital hardware/notes 11/15/2011  . HERNIA REPAIR  1308   Umbilical  . LAPAROSCOPY N/A 02/27/2016   Procedure: LAPAROSCOPY, LAPAROTOMY  WITH TWO SMALL BOWEL RESECTION;  Surgeon: Johnathan Hausen, Brett;  Location: [facility] ORS;  Service: General;  Laterality: N/A;  . ORIF ORBITAL FRACTURE Right 08/15/2010    caught in a hydraulic machine; open reduction internal fixation of orbital rim fracture and open reduction of zygomatic arch fracture  Archie Endo 10/13/2009  . PORTACATH PLACEMENT Left 04/04/2016   Procedure: INSERTION PORT-A-CATH LEFT CHEST;  Surgeon: Melrose Nakayama, Brett;  Location: [place];  Service: Thoracic;  Laterality: Left;  Marland Kitchen VIDEO ASSISTED THORACOSCOPY (VATS)/ LOBECTOMY Right 9/27/[name]   Procedure: VIDEO ASSISTED THORACOSCOPY (VATS)/ LOBECTOMY;  Surgeon: Melrose Nakayama, Brett;  Location: [facility];  Service: Thoracic;  Laterality: Right;  Marland Kitchen VIDEO BRONCHOSCOPY Bilateral 8/31/[name]   Procedure: VIDEO BRONCHOSCOPY WITH FLUORO;  Surgeon: Juanito Doom, Brett;  Location: [facility] ENDOSCOPY;  Service: Cardiopulmonary;  Laterality: Bilateral;    REVIEW OF SYSTEMS:  A comprehensive review of systems was negative except for: Gastrointestinal: positive for diarrhea   PHYSICAL EXAMINATION: General appearance: alert, cooperative and no distress Head: Normocephalic, without obvious abnormality, atraumatic Neck: no adenopathy, no JVD, supple, symmetrical, trachea midline and thyroid not enlarged, symmetric, no tenderness/mass/nodules Lymph nodes: Cervical, supraclavicular, and axillary nodes normal. Resp: clear to auscultation bilaterally Back: symmetric, no curvature. ROM normal. No CVA tenderness. Cardio: regular rate and rhythm, S1, S2 normal, no murmur, click, rub or gallop GI: soft, non-tender; bowel sounds normal; no masses,  no organomegaly Extremities: extremities normal, atraumatic, no cyanosis or edema  ECOG PERFORMANCE STATUS: 1 - Symptomatic but completely ambulatory  Blood pressure 108/84, pulse (!) 101, temperature 97.8 F (  36.6 C), temperature source Oral, resp. rate 18, height 5' 7"  (1.702 m), weight 104 lb (47.2 kg), SpO2 100 %.  LABORATORY DATA: Lab Results  Component Value Date   WBC 6.0 10/22/2018   HGB 13.8 10/22/2018   HCT 41.7 10/22/2018   MCV 93.3 10/22/2018   PLT 225 10/22/2018      Chemistry      Component Value Date/Time   NA 136 10/01/2018 1257   NA 140 01/23/2017 0910   K 4.2 10/01/2018 1257   K 4.5 01/23/2017 0910   CL 102 10/01/2018 1257   CO2 27 10/01/2018 1257   CO2 28 01/23/2017 0910   BUN <4 (L) 10/01/2018 1257   BUN 8.1 01/23/2017 0910   CREATININE 0.82 10/01/2018 1257   CREATININE 0.8 01/23/2017 0910   GLU 398 (H) 08/08/2016 1257      Component Value Date/Time   CALCIUM 8.6 (L) 10/01/2018 1257   CALCIUM 9.6  01/23/2017 0910   ALKPHOS 169 (H) 10/01/2018 1257   ALKPHOS 113 01/23/2017 0910   AST 58 (H) 10/01/2018 1257   AST 11 01/23/2017 0910   ALT 53 (H) 10/01/2018 1257   ALT 12 01/23/2017 0910   BILITOT 0.5 10/01/2018 1257   BILITOT 0.40 01/23/2017 0910       RADIOGRAPHIC STUDIES: No results found.   ASSESSMENT AND PLAN:  This is a very pleasant 59 years old white male with metastatic non-small cell lung [name], Brett status post right upper lobectomy with lymph node dissection. Unfortunately the patient was found to have metastatic disease in the jejunum and terminal ileum.  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 The patient was started on treatment with systemic chemotherapy with carboplatin and Alimta for 2 cycles discontinued secondary to intolerance and disease progression. The patient is currently undergoing treatment with second line immunotherapy with Ketruda (pembrolizumab) 200 mg IV every 3 weeks, status post 39 cycles. The patient continues to tolerate this treatment well with no concerning complaints. I recommended for him to proceed with cycle #40 today as planned. I will see him back for follow-up visit in 3 weeks for evaluation after repeating CT scan of the chest, abdomen and pelvis for restaging of his disease. For the diarrhea, he will continue on Imodium. For the diabetes mellitus he is followed by Dr. Daryll Drown at the [facility] internal medicine teaching program. The patient was advised to call immediately if he has any concerning symptoms in the interval. The patient voices understanding of current disease status and treatment options and is in agreement with the current care plan. All questions were answered. The patient knows to call the clinic with any problems, questions or concerns. We can certainly see the patient much  sooner if necessary.  Disclaimer: This note was dictated with voice recognition software. Similar sounding words can inadvertently be transcribed and may not be corrected upon review.

## 2018-10-22 NOTE — Patient Instructions (Signed)
[facility] Discharge Instructions for Patients Receiving Chemotherapy  Today you received the following chemotherapy agents:  Keytruda.  To help prevent nausea and vomiting after your treatment, we encourage you to take your nausea medication as directed.   If you develop nausea and vomiting that is not controlled by your nausea medication, call the clinic.   BELOW ARE SYMPTOMS THAT SHOULD BE REPORTED IMMEDIATELY:  *FEVER GREATER THAN 100.5 F  *CHILLS WITH OR WITHOUT FEVER  NAUSEA AND VOMITING THAT IS NOT CONTROLLED WITH YOUR NAUSEA MEDICATION  *UNUSUAL SHORTNESS OF BREATH  *UNUSUAL BRUISING OR BLEEDING  TENDERNESS IN MOUTH AND THROAT WITH OR WITHOUT PRESENCE OF ULCERS  *URINARY PROBLEMS  *BOWEL PROBLEMS  UNUSUAL RASH Items with * indicate a potential emergency and should be followed up as soon as possible.  Feel free to call the clinic should you have any questions or concerns. The clinic phone number is [phone].  Please show the CHEMO ALERT CARD at check-in to the Emergency Department and triage nurse.    

## 2018-10-22 NOTE — Patient Instructions (Signed)
Steps to Quit Smoking Smoking tobacco is the leading cause of preventable death. It can affect almost every organ in the body. Smoking puts you and people around you at risk for many serious, long-lasting (chronic) diseases. Quitting smoking can be hard, but it is one of the best things that you can do for your health. It is never too late to quit. How do I get ready to quit? When you decide to quit smoking, make a plan to help you succeed. Before you quit:  Pick a date to quit. Set a date within the next 2 weeks to give you time to prepare.  Write down the reasons why you are quitting. Keep this list in places where you will see it often.  Tell your family, friends, and co-workers that you are quitting. Their support is important.  Talk with your doctor about the choices that may help you quit.  Find out if your health insurance will pay for these treatments.  Know the people, places, things, and activities that make you want to smoke (triggers). Avoid them. What first steps can I take to quit smoking?  Throw away all cigarettes at home, at work, and in your car.  Throw away the things that you use when you smoke, such as ashtrays and lighters.  Clean your car. Make sure to empty the ashtray.  Clean your home, including curtains and carpets. What can I do to help me quit smoking? Talk with your doctor about taking medicines and seeing a counselor at the same time. You are more likely to succeed when you do both.  If you are pregnant or breastfeeding, talk with your doctor about counseling or other ways to quit smoking. Do not take medicine to help you quit smoking unless your doctor tells you to do so. To quit smoking: Quit right away  Quit smoking totally, instead of slowly cutting back on how much you smoke over a period of time.  Go to counseling. You are more likely to quit if you go to counseling sessions regularly. Take medicine You may take medicines to help you quit. Some  medicines need a prescription, and some you can buy over-the-counter. Some medicines may contain a drug called nicotine to replace the nicotine in cigarettes. Medicines may:  Help you to stop having the desire to smoke (cravings).  Help to stop the problems that come when you stop smoking (withdrawal symptoms). Your doctor may ask you to use:  Nicotine patches, gum, or lozenges.  Nicotine inhalers or sprays.  Non-nicotine medicine that is taken by mouth. Find resources Find resources and other ways to help you quit smoking and remain smoke-free after you quit. These resources are most helpful when you use them often. They include:  Online chats with a counselor.  Phone quitlines.  Printed self-help materials.  Support groups or group counseling.  Text messaging programs.  Mobile phone apps. Use apps on your mobile phone or tablet that can help you stick to your quit plan. There are many free apps for mobile phones and tablets as well as websites. Examples include Quit Guide from the CDC and smokefree.gov  What things can I do to make it easier to quit?   Talk to your family and friends. Ask them to support and encourage you.  Call a phone quitline (1-800-QUIT-NOW), reach out to support groups, or work with a counselor.  Ask people who smoke to not smoke around you.  Avoid places that make you want to smoke,   such as: ? Bars. ? Parties. ? Smoke-break areas at work.  Spend time with people who do not smoke.  Lower the stress in your life. Stress can make you want to smoke. Try these things to help your stress: ? Getting regular exercise. ? Doing deep-breathing exercises. ? Doing yoga. ? Meditating. ? Doing a body scan. To do this, close your eyes, focus on one area of your body at a time from head to toe. Notice which parts of your body are tense. Try to relax the muscles in those areas. How will I feel when I quit smoking? Day 1 to 3 weeks Within the first 24 hours,  you may start to have some problems that come from quitting tobacco. These problems are very bad 2-3 days after you quit, but they do not often last for more than 2-3 weeks. You may get these symptoms:  Mood swings.  Feeling restless, nervous, angry, or annoyed.  Trouble concentrating.  Dizziness.  Strong desire for high-sugar foods and nicotine.  Weight gain.  Trouble pooping (constipation).  Feeling like you may vomit (nausea).  Coughing or a sore throat.  Changes in how the medicines that you take for other issues work in your body.  Depression.  Trouble sleeping (insomnia). Week 3 and afterward After the first 2-3 weeks of quitting, you may start to notice more positive results, such as:  Better sense of smell and taste.  Less coughing and sore throat.  Slower heart rate.  Lower blood pressure.  Clearer skin.  Better breathing.  Fewer sick days. Quitting smoking can be hard. Do not give up if you fail the first time. Some people need to try a few times before they succeed. Do your best to stick to your quit plan, and talk with your doctor if you have any questions or concerns. Summary  Smoking tobacco is the leading cause of preventable death. Quitting smoking can be hard, but it is one of the best things that you can do for your health.  When you decide to quit smoking, make a plan to help you succeed.  Quit smoking right away, not slowly over a period of time.  When you start quitting, seek help from your doctor, family, or friends. This information is not intended to replace advice given to you by your health care provider. Make sure you discuss any questions you have with your health care provider. Document Released: 11/03/2008 Document Revised: 03/27/2018 Document Reviewed: 03/28/2018 Elsevier Patient Education  2020 Elsevier Inc.  

## 2018-10-23 ENCOUNTER — Ambulatory Visit (INDEPENDENT_AMBULATORY_CARE_PROVIDER_SITE_OTHER): Payer: Medicare HMO | Admitting: Internal Medicine

## 2018-10-23 ENCOUNTER — Other Ambulatory Visit: Payer: Self-pay

## 2018-10-23 ENCOUNTER — Encounter: Payer: Self-pay | Admitting: Internal Medicine

## 2018-10-23 VITALS — BP 100/73 | HR 89 | Temp 98.0°F | Ht 67.0 in | Wt 105.2 lb

## 2018-10-23 DIAGNOSIS — I10 Essential (primary) hypertension: Secondary | ICD-10-CM

## 2018-10-23 DIAGNOSIS — C3490 Malignant neoplasm of unspecified part of unspecified bronchus or lung: Secondary | ICD-10-CM | POA: Diagnosis not present

## 2018-10-23 DIAGNOSIS — E785 Hyperlipidemia, unspecified: Secondary | ICD-10-CM | POA: Diagnosis not present

## 2018-10-23 DIAGNOSIS — R197 Diarrhea, unspecified: Secondary | ICD-10-CM | POA: Diagnosis not present

## 2018-10-23 DIAGNOSIS — E1159 Type 2 diabetes mellitus with other circulatory complications: Secondary | ICD-10-CM | POA: Diagnosis not present

## 2018-10-23 DIAGNOSIS — E1151 Type 2 diabetes mellitus with diabetic peripheral angiopathy without gangrene: Secondary | ICD-10-CM

## 2018-10-23 DIAGNOSIS — Z9114 Patient's other noncompliance with medication regimen: Secondary | ICD-10-CM | POA: Diagnosis not present

## 2018-10-23 DIAGNOSIS — Z79890 Hormone replacement therapy (postmenopausal): Secondary | ICD-10-CM | POA: Diagnosis not present

## 2018-10-23 DIAGNOSIS — E039 Hypothyroidism, unspecified: Secondary | ICD-10-CM | POA: Diagnosis not present

## 2018-10-23 DIAGNOSIS — Z794 Long term (current) use of insulin: Secondary | ICD-10-CM

## 2018-10-23 LAB — POCT GLYCOSYLATED HEMOGLOBIN (HGB A1C): Hemoglobin A1C: 11.1 % — AB (ref 4.0–5.6)

## 2018-10-23 LAB — GLUCOSE, CAPILLARY: Glucose-Capillary: 297 mg/dL — ABNORMAL HIGH (ref 70–99)

## 2018-10-23 MED ORDER — DIPHENOXYLATE-ATROPINE 2.5-0.025 MG PO TABS: 1.0000 | ORAL_TABLET | Freq: Four times a day (QID) | ORAL | 0 refills | Status: DC | PRN

## 2018-10-23 MED ORDER — PIOGLITAZONE HCL 15 MG PO TABS: 15.0000 mg | ORAL_TABLET | Freq: Every day | ORAL | 3 refills | Status: DC

## 2018-10-23 MED ORDER — INSULIN GLARGINE 100 UNIT/ML SOLOSTAR PEN: 5.0000 [IU] | PEN_INJECTOR | Freq: Every day | SUBCUTANEOUS | 3 refills | Status: AC

## 2018-10-23 NOTE — Assessment & Plan Note (Signed)
This is a chronic issue for him, thought to be related to [facility] as well (12 - 28%).    Stop metformin in case this could be contributing Trial of Lomotil to see if this helps his diarrhea.

## 2018-10-23 NOTE — Progress Notes (Signed)
   Subjective:    Patient ID: Brett [name], male    DOB: December 12, 1960, 58 y.o.   MRN: [id number]  CC: 3 month follow up for DM2  HPI  Brett [name] is a 58 year old man with PMH of uncontrolled DM2, HTN, hypothyroidism, HLD, PVD, adenocarcinoma of the lung who presents for follow up.   Brett [name] has had uncontrolled diabetes for quite a while.  He is on a wide variety of medications.  Today, he reports that he is doing relatively well, but having issues with gaining weight.  He has an A1C which is elevated and he and his wife think it is temporally related to when he started on Keytruda.  He is currently taking metformin 500mg  BID, glipizide 10mg  BID and januvia 100mg  daily.  He has been tried on a flozin, but found the increased urination to be unmanageable for him.  He had been taking insulin, however, was having low blood sugars.  Now he is rarely taking his insulin and is having mostly high blood sugars.  BS log was reviewed.    Another issue which is most distressing to him is loose stools.  These have also been persistent since being on Keytruda.  He takes Loperamide with only some relief.  His TSH checked at his Oncology appointment yesterday was mildly high (6).  He notes that he is taking his synthroid without issue.    Review of Systems  Constitutional: Negative for activity change, appetite change, fever and unexpected weight change.  Respiratory: Negative for cough and shortness of breath.   Cardiovascular: Negative for chest pain.  Gastrointestinal: Positive for diarrhea. Negative for abdominal pain and constipation.  Genitourinary: Negative for difficulty urinating, enuresis and frequency.  Musculoskeletal: Positive for arthralgias.  Neurological: Negative for dizziness and weakness.  Psychiatric/Behavioral: Positive for confusion (memory issues). Negative for decreased concentration and dysphoric mood.       Objective:   Physical Exam Vitals signs and nursing note reviewed.   Constitutional:      General: He is not in acute distress.    Appearance: He is not toxic-appearing.     Comments: He is very thin  HENT:     Head: Normocephalic and atraumatic.  Musculoskeletal:        General: No swelling.     Right lower leg: No edema.     Left lower leg: No edema.  Skin:    General: Skin is warm and dry.  Neurological:     Mental Status: He is alert and oriented to person, place, and time. Mental status is at baseline.  Psychiatric:        Mood and Affect: Mood normal.        Behavior: Behavior normal.   No blood work today.   Reviewed blood work from 10/22/18.     Assessment & Plan:  RTC in 3 months for DM care.

## 2018-10-23 NOTE — Assessment & Plan Note (Signed)
This was the main issue we discussed today.  His A1C was 11.1.  We spoke about his issues previously with hypoglycemia when his insulin was increased b/c he does not eat regular meals.  He has also had a worsening of his diabetes since being on Keytruda, however, this medication is working to control his metastatic cancer.  He was not able to tolerate Jardiance previously.  Based on review, [name] can cause hyperglycemia in 20 - 60% of cases and decreased appetite in 10 - 25% of cases.   Given his issues with insulin, we discussed a new regimen today.   Plan Decrease insulin glargine to 5 units at night, stop insulin aspart Stop metformin given diarrhea Continue glipizide 10mg  BID and sitagliptin 100mg  daily START pioglitozone 15mg  daily Return in 3 months Continue statin and good BP control.

## 2018-10-23 NOTE — Patient Instructions (Signed)
Mr. Dini - -   Please STOP metformin.   Please try Lomotil for your diarrhea.   For your diabetes - -   Take Glipizide 10mg  twice a day (no change) Start Pioglitozone 15mg  daily (new) Decrease insulin glargine to 5mg  daily at night  Come back to see me in 3 months, sooner if needed.

## 2018-10-26 ENCOUNTER — Other Ambulatory Visit: Payer: Self-pay

## 2018-10-26 DIAGNOSIS — G894 Chronic pain syndrome: Secondary | ICD-10-CM

## 2018-10-26 MED ORDER — OXYCODONE-ACETAMINOPHEN 10-325 MG PO TABS: 1.0000 | ORAL_TABLET | Freq: Four times a day (QID) | ORAL | 0 refills | Status: DC | PRN

## 2018-10-26 NOTE — Telephone Encounter (Signed)
Confusion can happen with that medication.  If it becomes more problematic, I would have him stop taking it.    Refills sent in.  Thanks!  Gilles Chiquito, MD

## 2018-10-26 NOTE — Telephone Encounter (Signed)
oxyCODONE-acetaminophen (PERCOCET) 10-325 MG tablet   Refill request @  [facility] [place], [place] - [address] AT [facility] [address] [phone] (Phone) [phone] (Fax)   Requesting to speak with a nurse about stomach med, please call pt back.

## 2018-10-26 NOTE — Telephone Encounter (Signed)
Dr Daryll Drown, pt's wife states when he started taking the script lomotil that you gave him he gets a little confused and feels funny. Then he clears up as it wears off. It is working for the diarrhea.  Also request new scripts for oxy 10/325

## 2018-10-27 NOTE — Telephone Encounter (Signed)
Gave message to spouse, she is agreeable and states he is doing better

## 2018-11-05 ENCOUNTER — Other Ambulatory Visit: Payer: Self-pay | Admitting: Internal Medicine

## 2018-11-05 DIAGNOSIS — C3491 Malignant neoplasm of unspecified part of right bronchus or lung: Secondary | ICD-10-CM

## 2018-11-09 ENCOUNTER — Telehealth: Payer: Self-pay | Admitting: *Deleted

## 2018-11-09 NOTE — Telephone Encounter (Signed)
TCT pt's home.  Spoke with Brett [name]'s wife. Confirmed with her that pt has picked the steroids he takes prior to tomorrow's CT scan d/t dye allergy.  She states he did pick them up from pharmacy and he knows what time he needs to take them.

## 2018-11-10 ENCOUNTER — Other Ambulatory Visit: Payer: Self-pay

## 2018-11-10 ENCOUNTER — Ambulatory Visit (HOSPITAL_COMMUNITY)
Admission: RE | Admit: 2018-11-10 | Discharge: 2018-11-10 | Disposition: A | Discharge status: Home / Self Care | Payer: Medicare HMO | Source: Ambulatory Visit | Attending: Internal Medicine | Admitting: Internal Medicine

## 2018-11-10 DIAGNOSIS — C171 Malignant neoplasm of jejunum: Secondary | ICD-10-CM | POA: Diagnosis not present

## 2018-11-10 DIAGNOSIS — C3490 Malignant neoplasm of unspecified part of unspecified bronchus or lung: Secondary | ICD-10-CM | POA: Diagnosis not present

## 2018-11-10 DIAGNOSIS — C3491 Malignant neoplasm of unspecified part of right bronchus or lung: Secondary | ICD-10-CM | POA: Diagnosis not present

## 2018-11-10 MED ORDER — SODIUM CHLORIDE (PF) 0.9 % IJ SOLN
INTRAMUSCULAR | Status: AC
  Filled 2018-11-10: qty 50

## 2018-11-10 MED ORDER — IOHEXOL 300 MG/ML  SOLN
75.0000 mL | Freq: Once | INTRAMUSCULAR | Status: AC | PRN
  Administered 2018-11-10: 10:00:00 75 mL via INTRAVENOUS

## 2018-11-12 ENCOUNTER — Inpatient Hospital Stay (HOSPITAL_BASED_OUTPATIENT_CLINIC_OR_DEPARTMENT_OTHER): Payer: Medicare HMO | Admitting: Internal Medicine

## 2018-11-12 ENCOUNTER — Inpatient Hospital Stay: Payer: Medicare HMO

## 2018-11-12 ENCOUNTER — Other Ambulatory Visit: Payer: Self-pay

## 2018-11-12 ENCOUNTER — Telehealth: Payer: Self-pay | Admitting: Internal Medicine

## 2018-11-12 VITALS — BP 111/80 | HR 96 | Temp 98.3°F | Resp 18 | Ht 67.0 in | Wt 101.5 lb

## 2018-11-12 DIAGNOSIS — C3491 Malignant neoplasm of unspecified part of right bronchus or lung: Secondary | ICD-10-CM

## 2018-11-12 DIAGNOSIS — Z95828 Presence of other vascular implants and grafts: Secondary | ICD-10-CM

## 2018-11-12 DIAGNOSIS — Z7189 Other specified counseling: Secondary | ICD-10-CM

## 2018-11-12 DIAGNOSIS — Z5112 Encounter for antineoplastic immunotherapy: Secondary | ICD-10-CM | POA: Diagnosis not present

## 2018-11-12 DIAGNOSIS — C3411 Malignant neoplasm of upper lobe, right bronchus or lung: Secondary | ICD-10-CM | POA: Diagnosis not present

## 2018-11-12 DIAGNOSIS — Z5189 Encounter for other specified aftercare: Secondary | ICD-10-CM | POA: Diagnosis not present

## 2018-11-12 DIAGNOSIS — C784 Secondary malignant neoplasm of small intestine: Secondary | ICD-10-CM | POA: Diagnosis not present

## 2018-11-12 DIAGNOSIS — C3490 Malignant neoplasm of unspecified part of unspecified bronchus or lung: Secondary | ICD-10-CM

## 2018-11-12 DIAGNOSIS — Z5111 Encounter for antineoplastic chemotherapy: Secondary | ICD-10-CM | POA: Diagnosis not present

## 2018-11-12 DIAGNOSIS — C171 Malignant neoplasm of jejunum: Secondary | ICD-10-CM | POA: Diagnosis not present

## 2018-11-12 DIAGNOSIS — Z79899 Other long term (current) drug therapy: Secondary | ICD-10-CM | POA: Diagnosis not present

## 2018-11-12 LAB — CBC WITH DIFFERENTIAL (CANCER CENTER ONLY)
Abs Immature Granulocytes: 0.02 10*3/uL (ref 0.00–0.07)
Basophils Absolute: 0 10*3/uL (ref 0.0–0.1)
Basophils Relative: 1 %
Eosinophils Absolute: 0.1 10*3/uL (ref 0.0–0.5)
Eosinophils Relative: 2 %
HCT: 43.2 % (ref 39.0–52.0)
Hemoglobin: 14.1 g/dL (ref 13.0–17.0)
Immature Granulocytes: 0 %
Lymphocytes Relative: 20 %
Lymphs Abs: 1.2 10*3/uL (ref 0.7–4.0)
MCH: 30.3 pg (ref 26.0–34.0)
MCHC: 32.6 g/dL (ref 30.0–36.0)
MCV: 92.7 fL (ref 80.0–100.0)
Monocytes Absolute: 0.5 10*3/uL (ref 0.1–1.0)
Monocytes Relative: 8 %
Neutro Abs: 4.4 10*3/uL (ref 1.7–7.7)
Neutrophils Relative %: 69 %
Platelet Count: 224 10*3/uL (ref 150–400)
RBC: 4.66 MIL/uL (ref 4.22–5.81)
RDW: 13.1 % (ref 11.5–15.5)
WBC Count: 6.2 10*3/uL (ref 4.0–10.5)
nRBC: 0 % (ref 0.0–0.2)

## 2018-11-12 LAB — CMP (CANCER CENTER ONLY)
ALT: 28 U/L (ref 0–44)
AST: 34 U/L (ref 15–41)
Albumin: 3.2 g/dL — ABNORMAL LOW (ref 3.5–5.0)
Alkaline Phosphatase: 120 U/L (ref 38–126)
Anion gap: 8 (ref 5–15)
BUN: 7 mg/dL (ref 6–20)
CO2: 27 mmol/L (ref 22–32)
Calcium: 8.6 mg/dL — ABNORMAL LOW (ref 8.9–10.3)
Chloride: 105 mmol/L (ref 98–111)
Creatinine: 0.66 mg/dL (ref 0.61–1.24)
GFR, Est AFR Am: >60 mL/min (ref >60)
GFR, Estimated: >60 mL/min (ref >60)
Glucose, Bld: 123 mg/dL — ABNORMAL HIGH (ref 70–99)
Potassium: 3.8 mmol/L (ref 3.5–5.1)
Sodium: 140 mmol/L (ref 135–145)
Total Bilirubin: 0.4 mg/dL (ref 0.3–1.2)
Total Protein: 6 g/dL — ABNORMAL LOW (ref 6.5–8.1)

## 2018-11-12 LAB — TSH: TSH: 4.003 u[IU]/mL (ref 0.320–4.118)

## 2018-11-12 MED ORDER — DEXAMETHASONE 4 MG PO TABS: ORAL_TABLET | ORAL | 1 refills | Status: DC

## 2018-11-12 MED ORDER — SODIUM CHLORIDE 0.9% FLUSH
10.0000 mL | INTRAVENOUS | Status: DC | PRN
  Administered 2018-11-12: 10 mL
  Filled 2018-11-12: qty 10

## 2018-11-12 NOTE — Telephone Encounter (Signed)
Scheduled per 10/22 los, cancelled 11/12 appointments due to new treatment. Waiting for add on for 10/19, patient received calender and after visit summary.

## 2018-11-12 NOTE — Progress Notes (Signed)
DISCONTINUE ON PATHWAY REGIMEN - Non-Small Cell Lung     A cycle is 21 days:     Pembrolizumab   **Always confirm dose/schedule in your pharmacy ordering system**  REASON: Disease Progression PRIOR TREATMENT: [id number]: Pembrolizumab 200 mg q21 Days Until Disease Progression, Unacceptable Toxicity, or up to 24 Months TREATMENT RESPONSE: Progressive Disease (PD)  START ON PATHWAY REGIMEN - Non-Small Cell Lung     A cycle is every 21 days:     Ramucirumab      Docetaxel   **Always confirm dose/schedule in your pharmacy ordering system**  Patient Characteristics: Stage IV Metastatic, Nonsquamous, Second Line - Chemotherapy/Immunotherapy, PS = 0, 1, No Prior PD-1/PD-L1  Inhibitor or Prior PD-1/PD-L1 Inhibitor + Chemotherapy, and Not a Candidate for Immunotherapy AJCC T Category: Staged < 8th Ed. Current Disease Status: Distant Metastases AJCC N Category: N0 AJCC M Category: M1b AJCC 8 Stage Grouping: IVA Histology: Nonsquamous Cell ROS1 Rearrangement Status: Negative T790M Mutation Status: Not Applicable - EGFR Mutation Negative/Unknown Other Mutations/Biomarkers: No Other Actionable Mutations Chemotherapy/Immunotherapy LOT: Second Line Chemotherapy/Immunotherapy Molecular Targeted Therapy: Not Appropriate MET Exon 14 Mutation Status: Negative RET Gene Fusion Status: Negative EGFR Mutation Status: Negative/Wild Type NTRK Gene Fusion Status: Negative PD-L1 Expression Status: Quantity Not Sufficient ALK Rearrangement Status: Negative BRAF [id number] Mutation Status: Negative ECOG Performance Status: 1 Immunotherapy Candidate Status: Not a Candidate for Immunotherapy Prior Immunotherapy Status: Prior PD-1/PD-L1 Inhibitor + Chemotherapy Intent of Therapy: Non-Curative / Palliative Intent, Discussed with Patient

## 2018-11-12 NOTE — Progress Notes (Signed)
[facility] [phone]   [phone]  OFFICE PROGRESS NOTE  Sid Falcon, MD [place] [address]  DIAGNOSIS: Stage IV (T1b, N0, M1b) non-small cell lung cancer, adenocarcinoma presented with right upper lobe lung nodule and recent metastasis to the small intestine. This was initially diagnosed in September 2017.  Genomic Alterations Identified ERBB2 amplification - equivocal? CDKN2A p16INK4a E88* and p14ARF S568L SMARCA4 splice site [id number]>SW SPTA1 E2022* TOP2A amplification TP53 A159P Additional Findings Microsatellite status MS-Stable Tumor Mutation Burden TMB-Intermediate; 18 Muts/Mb Additional Disease-relevant Genes with No Reportable Alterations Identified EGFR KRAS ALK BRAF MET RET ROS1   PRIOR THERAPY:  1) Status post right VATS with right upper lobectomy and mediastinal lymph node dissection under the care of Dr. Roxan Hockey on 10/18/2015 and the final pathology was consistent with stage IA (T1b, N0, MX). 2) upper endoscopy on 01/05/2016 showed normal esophagus, normal stomach but there was occasional mass around 3.0 CM in length circumferential nonobstructing in the jejunum. The final pathology was consistent with metastatic adenocarcinoma. 3) status post laparoscopic laparotomy and resection of proximal lesion and and distal jejunum/proximal ileum under the care of Dr. Hassell Done 1 02/27/2016. 3)  Systemic chemotherapy with carboplatin for AUC of 5 and Alimta 500 MG/M2 every 3 weeks. First dose 04/04/2016. Status post 2 cycles. Last dose was given 04/21/2016 discontinued secondary to disease progression. 4) Second line immunotherapy with Ketruda 200 mg IV every 2 weeks, first dose 05/30/2016. Status post 40 cycles.  Last dose October 22, 2018.  Discontinued today secondary to disease progression.  CURRENT THERAPY: Systemic chemotherapy with docetaxel 75 mg/M2 and Cyramza 10 mg/KG every 3 weeks.  First dose  November 19, 2018.   INTERVAL HISTORY: Brett [name] 58 y.o. male returns to the clinic today for follow-up visit.  His wife was available by phone during the visit.  The patient is feeling fine today with no concerning complaints except for new back pain.  He was worried about kidney stone.  He denied having any current chest pain, shortness of breath, cough or hemoptysis.  He denied having any recent weight loss or night sweats.  He has no nausea, vomiting, diarrhea or constipation.  He has no headache or visual changes.  The patient has been tolerating his treatment with immunotherapy fairly well.  He had repeat CT scan of the chest, abdomen pelvis performed recently and he is here for evaluation and discussion of his scan results.  MEDICAL HISTORY: Past Medical History:  Diagnosis Date   Barrett's esophagus 07/01/2013   Without dysplasia on biopsy 09/03/2012. Repeat EGD recommended 08/2015   Bilateral cataracts 02/13/2017   Carotid artery stenosis 07/01/2013   Requiring right sided stent    Chronic pain syndrome 07/01/2013   Closed head injury with brief loss of consciousness ([place]) 07/22/2010   Head trapped in a hydraulic device at work.  Fracture of orbital bones on right and brief loss of consciousness per report.   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 There was likely a strong psycho-social overlay in regard to the cognitive deficits in the form of mood disorder with psychotic features and mixed anxiety symptomatology. His primary tested cognitive deficits are in the areas of attention, executi   Daily headache "since 07/2010"   constantly   Degenerative joint disease of cervical spine 07/01/2013   Dupuytren's contracture  of both hands 04/08/2014   Encounter for antineoplastic chemotherapy 10/05/2015   Encounter for antineoplastic  immunotherapy 05/24/2016   Erectile dysfunction associated with type 2 diabetes mellitus ([place]) 07/01/2013   Fibromyalgia 07/01/2013   Goals of care, counseling/discussion 03/28/2016   History of blood transfusion 11/28/2015   "suppose to get his first today" (11/28/2015)   Hyperlipidemia LDL goal < 100 07/01/2013   Intractable hiccups 04/11/2016   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   Memory changes    "memory issues" from head injury   Moderate protein-calorie malnutrition ([place]) 11/29/2015   Osteoarthritis of right thumb 10/21/2014   Peripheral vascular occlusive disease ([place]) 07/01/2013   Requiring 2 arterial stents above the left knee per report   Pneumonia ~ 2006/2007   Post traumatic stress disorder 07/01/2013   Primary lung adenocarcinoma ([place]) dx'd 08/2015   "right lung"   Severe major depression with psychotic features ([place]) 04/15/2011   Tobacco abuse 07/01/2013   Type 2 diabetes mellitus with vascular disease ([place]) 07/01/2013   Left lower extremity and right carotid stenting    ALLERGIES:  is allergic to gabapentin; lyrica [pregabalin]; jardiance [empagliflozin]; celebrex [celecoxib]; and contrast media [iodinated diagnostic agents].  MEDICATIONS:  Current Outpatient Medications  Medication Sig Dispense Refill   ACCU-CHEK SOFTCLIX LANCETS lancets Use to test blood glucose 1-2 times daily. Dx Code E11.59 100 each 12   aspirin EC 81 MG tablet Take 81 mg by mouth daily.      atorvastatin (LIPITOR) 40 MG tablet Take 1 tablet (40 mg total) by mouth daily. 90 tablet 3   Blood Glucose Monitoring Suppl (ACCU-CHEK AVIVA PLUS) w/Device KIT Use to test blood glucose 1-2 times daily. Dx Code E11.59 1 kit 0   diphenoxylate-atropine (LOMOTIL) 2.5-0.025 MG tablet Take 1 tablet by mouth 4 (four) times daily as needed for diarrhea or loose stools. 30 tablet 0   glipiZIDE (GLUCOTROL) 10 MG tablet Take 1 tablet (10 mg total) by mouth 2 (two) times daily before a meal. 180 tablet 3     glucose blood test strip USE TO TEST BLOOD GLUCOSE 1 TO 2 TIMES DAILY 100 each 4   Insulin Glargine (LANTUS) 100 UNIT/ML Solostar Pen Inject 5 Units into the skin daily. 15 mL 3   Insulin Pen Needle (PEN NEEDLES) 32G X 4 MM MISC 1 pen by Does not apply route 2 (two) times daily. Dx Code E11.59 200 each 6   levothyroxine (SYNTHROID) 50 MCG tablet Take 1 tablet (50 mcg total) by mouth daily before breakfast. 30 tablet 2   lisinopril (ZESTRIL) 5 MG tablet Take 0.5 tablets (2.5 mg total) by mouth daily. 90 tablet 3   loperamide (IMODIUM A-D) 2 MG tablet Take 2 mg by mouth 4 (four) times daily as needed for diarrhea or loose stools.     oxyCODONE-acetaminophen (PERCOCET) 10-325 MG tablet Take 1-2 tablets by mouth every 6 (six) hours as needed for pain. 240 tablet 0   pantoprazole (PROTONIX) 40 MG tablet TAKE 1 TABLET(40 MG) BY MOUTH DAILY 90 tablet 1   pioglitazone (ACTOS) 15 MG tablet Take 1 tablet (15 mg total) by mouth daily. 30 tablet 3   predniSONE (DELTASONE) 50 MG tablet TAKE 1 TABLET BY MOUTH AT 13 HOURS, 7 HOURS, AND 1 HOUR PRIOR TO SCAN 3 tablet 0   Probiotic Product (CULTURELLE IMMUNE DEFENSE) CAPS Take 1 capsule by mouth daily. 30 capsule 11   prochlorperazine (COMPAZINE) 10 MG tablet Take 10 mg by mouth every 6 (six) hours as needed  for nausea or vomiting.     sitaGLIPtin (JANUVIA) 100 MG tablet Take 1 tablet (100 mg total) by mouth daily. 90 tablet 3   No current facility-administered medications for this visit.    Facility-Administered Medications Ordered in Other Visits  Medication Dose Route Frequency Provider Last Rate Last Dose   sodium chloride flush (NS) 0.9 % injection 10 mL  10 mL Intracatheter PRN Curt Bears, MD   10 mL at 06/18/18 1333    SURGICAL HISTORY:  Past Surgical History:  Procedure Laterality Date   CAROTID STENT Right ?2014   COLONOSCOPY N/A 11/30/2015   Procedure: COLONOSCOPY;  Surgeon: Teena Irani, MD;  Location: [facility] ENDOSCOPY;  Service:  Endoscopy;  Laterality: N/A;   ESOPHAGOGASTRODUODENOSCOPY N/A 11/30/2015   Procedure: ESOPHAGOGASTRODUODENOSCOPY (EGD);  Surgeon: Teena Irani, MD;  Location: [facility] ENDOSCOPY;  Service: Endoscopy;  Laterality: N/A;   ESOPHAGOGASTRODUODENOSCOPY (EGD) WITH PROPOFOL N/A 01/05/2016   Procedure: ESOPHAGOGASTRODUODENOSCOPY (EGD) WITH PROPOFOL;  Surgeon: Teena Irani, MD;  Location: [facility] ENDOSCOPY;  Service: Endoscopy;  Laterality: N/A;   FEMORAL ARTERY STENT Left 05/2012; ~ 2015   Archie Endo 06/04/2012; Raechel Chute report   FRACTURE SURGERY     GIVENS CAPSULE STUDY N/A 12/22/2015   Procedure: GIVENS CAPSULE STUDY;  Surgeon: Wonda Horner, MD;  Location: [facility] ENDOSCOPY;  Service: Endoscopy;  Laterality: N/A;   HARDWARE REMOVAL Right 11/15/2011   Removal of deep frontozygomatic orbital hardware/notes 11/15/2011   HERNIA REPAIR  [id number]   Umbilical   LAPAROSCOPY N/A 02/27/2016   Procedure: LAPAROSCOPY, LAPAROTOMY  WITH TWO SMALL BOWEL RESECTION;  Surgeon: Johnathan Hausen, MD;  Location: [facility] ORS;  Service: General;  Laterality: N/A;   ORIF ORBITAL FRACTURE Right 08/15/2010    caught in a hydraulic machine; open reduction internal fixation of orbital rim fracture and open reduction of zygomatic arch fracture  /notes 10/13/2009   PORTACATH PLACEMENT Left 04/04/2016   Procedure: INSERTION PORT-A-CATH LEFT CHEST;  Surgeon: Melrose Nakayama, MD;  Location: [place];  Service: Thoracic;  Laterality: Left;   [place] (VATS)/ LOBECTOMY Right 10/18/2015   Procedure: VIDEO ASSISTED THORACOSCOPY (VATS)/ LOBECTOMY;  Surgeon: Melrose Nakayama, MD;  Location: [place] [facility];  Service: Thoracic;  Laterality: Right;   VIDEO BRONCHOSCOPY Bilateral 09/21/2015   Procedure: VIDEO BRONCHOSCOPY WITH FLUORO;  Surgeon: Juanito Doom, MD;  Location: [facility] ENDOSCOPY;  Service: Cardiopulmonary;  Laterality: Bilateral;    REVIEW OF SYSTEMS:  Constitutional: positive for fatigue Eyes: negative Ears, nose, mouth, throat, and face:  negative Respiratory: negative Cardiovascular: negative Gastrointestinal: positive for abdominal pain Genitourinary:negative Integument/breast: negative Hematologic/lymphatic: negative Musculoskeletal:positive for back pain Neurological: negative Behavioral/Psych: negative Endocrine: negative Allergic/Immunologic: negative   PHYSICAL EXAMINATION: General appearance: alert, cooperative and no distress Head: Normocephalic, without obvious abnormality, atraumatic Neck: no adenopathy, no JVD, supple, symmetrical, trachea midline and thyroid not enlarged, symmetric, no tenderness/mass/nodules Lymph nodes: Cervical, supraclavicular, and axillary nodes normal. Resp: clear to auscultation bilaterally Back: symmetric, no curvature. ROM normal. No CVA tenderness. Cardio: regular rate and rhythm, S1, S2 normal, no murmur, click, rub or gallop GI: soft, non-tender; bowel sounds normal; no masses,  no organomegaly Extremities: extremities normal, atraumatic, no cyanosis or edema Neurologic: Alert and oriented X 3, normal strength and tone. Normal symmetric reflexes. Normal coordination and gait  ECOG PERFORMANCE STATUS: 1 - Symptomatic but completely ambulatory  Blood pressure 111/80, pulse 96, temperature 98.3 F (36.8 C), temperature source Oral, resp. rate 18, height _0  (1.702 m), weight 101 lb 8 oz (46 kg), SpO2 100 %.  LABORATORY DATA: Lab Results  Component Value Date   WBC 6.2 11/12/2018   HGB 14.1 11/12/2018   HCT 43.2 11/12/2018   MCV 92.7 11/12/2018   PLT 224 11/12/2018      Chemistry      Component Value Date/Time   NA 139 10/22/2018 0835   NA 140 01/23/2017 0910   K 3.9 10/22/2018 0835   K 4.5 01/23/2017 0910   CL 104 10/22/2018 0835   CO2 27 10/22/2018 0835   CO2 28 01/23/2017 0910   BUN <4 (L) 10/22/2018 0835   BUN 8.1 01/23/2017 0910   CREATININE 0.71 10/22/2018 0835   CREATININE 0.8 01/23/2017 0910   GLU 398 (H) 08/08/2016 1257      Component Value  Date/Time   CALCIUM 8.6 (L) 10/22/2018 0835   CALCIUM 9.6 01/23/2017 0910   ALKPHOS 159 (H) 10/22/2018 0835   ALKPHOS 113 01/23/2017 0910   AST 61 (H) 10/22/2018 0835   AST 11 01/23/2017 0910   ALT 54 (H) 10/22/2018 0835   ALT 12 01/23/2017 0910   BILITOT 0.5 10/22/2018 0835   BILITOT 0.40 01/23/2017 0910       RADIOGRAPHIC STUDIES: Ct Chest W Contrast  Result Date: 11/10/2018 CLINICAL DATA:  Right-sided lung cancer, jejunal cancer restaging EXAM: CT CHEST, ABDOMEN, AND PELVIS WITH CONTRAST TECHNIQUE: Multidetector CT imaging of the chest, abdomen and pelvis was performed following the standard protocol during bolus administration of intravenous contrast. CONTRAST:  73m OMNIPAQUE IOHEXOL 300 MG/ML SOLN, additional oral enteric contrast COMPARISON:  08/19/2018, 06/17/2018 FINDINGS: CT CHEST FINDINGS Cardiovascular: Left chest port catheter. Aortic atherosclerosis. Normal heart size. Extensive 3 vessel coronary artery calcifications. No pericardial effusion. Mediastinum/Nodes: No enlarged mediastinal, hilar, or axillary lymph nodes. Thyroid gland, trachea, and esophagus demonstrate no significant findings. Lungs/Pleura: Redemonstrated postoperative findings of right upper lobectomy. Musculoskeletal: No chest wall mass or suspicious bone lesions identified. CT ABDOMEN PELVIS FINDINGS Hepatobiliary: No solid liver abnormality is seen. Severe hepatic steatosis. No gallstones, gallbladder wall thickening, or biliary dilatation. Pancreas: Unremarkable. No pancreatic ductal dilatation or surrounding inflammatory changes. Spleen: Normal in size without significant abnormality. Adrenals/Urinary Tract: Adrenal glands are unremarkable. Kidneys are normal, without renal calculi, solid lesion, or hydronephrosis. Bladder is unremarkable. Stomach/Bowel: Stomach is within normal limits. Redemonstrated findings of prior mid small bowel resection and reanastomosis. Appendix appears normal. Generally large burden of  stool in the distal colon with hyperenhancing, inflamed appearance of the distal colon and rectum (series 2, image 116). Vascular/Lymphatic: Aortic atherosclerosis. There are multiple new and enlarged hypodense retroperitoneal and mesenteric lymph nodes and nodules. An index nodule in the central small bowel mesentery measures 3.8 x 2.3 cm, previously 3.2 x 1.8 cm when measured similarly (series 2, image 82). There are multiple new, bulky retroperitoneal lymph nodes, the largest left periaortic node measuring 2.5 x 2.3 cm (series 2, image 71). Reproductive: No mass or other abnormality. Other: No abdominal wall hernia or abnormality. Trace ascites in the low pelvis (series 2, image 118). Musculoskeletal: No acute or significant osseous findings. IMPRESSION: 1. There are multiple new and enlarged hypodense retroperitoneal and mesenteric lymph nodes and nodules. An index nodule in the central small bowel mesentery measures 3.8 x 2.3 cm, previously 3.2 x 1.8 cm when measured similarly (series 2, image 82). There are multiple new, bulky retroperitoneal lymph nodes, the largest left periaortic node measuring 2.5 x 2.3 cm (series 2, image 71). Findings are consistent with worsened metastatic disease. 2. Postoperative findings of right upper lobectomy. No evidence  of metastatic disease in the chest. 3. Generally large burden of stool in the distal colon with hyperenhancing, inflamed appearance of the distal colon and rectum (series 2, image 116). Findings are consistent with nonspecific infectious, inflammatory, or ischemic colitis, and differential considerations include drug toxicity in the oncologic setting. 4. Trace ascites in the low pelvis, nonspecific and likely reactive to adjacent inflammation. 5.  Coronary artery disease.  Aortic Atherosclerosis (ICD10-I70.0). 6.  Hepatic steatosis. Electronically Signed   By: Eddie Candle M.D.   On: 11/10/2018 10:12   Ct Abdomen Pelvis W Contrast  Result Date:  11/10/2018 CLINICAL DATA:  Right-sided lung cancer, jejunal cancer restaging EXAM: CT CHEST, ABDOMEN, AND PELVIS WITH CONTRAST TECHNIQUE: Multidetector CT imaging of the chest, abdomen and pelvis was performed following the standard protocol during bolus administration of intravenous contrast. CONTRAST:  32m OMNIPAQUE IOHEXOL 300 MG/ML SOLN, additional oral enteric contrast COMPARISON:  08/19/2018, 06/17/2018 FINDINGS: CT CHEST FINDINGS Cardiovascular: Left chest port catheter. Aortic atherosclerosis. Normal heart size. Extensive 3 vessel coronary artery calcifications. No pericardial effusion. Mediastinum/Nodes: No enlarged mediastinal, hilar, or axillary lymph nodes. Thyroid gland, trachea, and esophagus demonstrate no significant findings. Lungs/Pleura: Redemonstrated postoperative findings of right upper lobectomy. Musculoskeletal: No chest wall mass or suspicious bone lesions identified. CT ABDOMEN PELVIS FINDINGS Hepatobiliary: No solid liver abnormality is seen. Severe hepatic steatosis. No gallstones, gallbladder wall thickening, or biliary dilatation. Pancreas: Unremarkable. No pancreatic ductal dilatation or surrounding inflammatory changes. Spleen: Normal in size without significant abnormality. Adrenals/Urinary Tract: Adrenal glands are unremarkable. Kidneys are normal, without renal calculi, solid lesion, or hydronephrosis. Bladder is unremarkable. Stomach/Bowel: Stomach is within normal limits. Redemonstrated findings of prior mid small bowel resection and reanastomosis. Appendix appears normal. Generally large burden of stool in the distal colon with hyperenhancing, inflamed appearance of the distal colon and rectum (series 2, image 116). Vascular/Lymphatic: Aortic atherosclerosis. There are multiple new and enlarged hypodense retroperitoneal and mesenteric lymph nodes and nodules. An index nodule in the central small bowel mesentery measures 3.8 x 2.3 cm, previously 3.2 x 1.8 cm when measured  similarly (series 2, image 82). There are multiple new, bulky retroperitoneal lymph nodes, the largest left periaortic node measuring 2.5 x 2.3 cm (series 2, image 71). Reproductive: No mass or other abnormality. Other: No abdominal wall hernia or abnormality. Trace ascites in the low pelvis (series 2, image 118). Musculoskeletal: No acute or significant osseous findings. IMPRESSION: 1. There are multiple new and enlarged hypodense retroperitoneal and mesenteric lymph nodes and nodules. An index nodule in the central small bowel mesentery measures 3.8 x 2.3 cm, previously 3.2 x 1.8 cm when measured similarly (series 2, image 82). There are multiple new, bulky retroperitoneal lymph nodes, the largest left periaortic node measuring 2.5 x 2.3 cm (series 2, image 71). Findings are consistent with worsened metastatic disease. 2. Postoperative findings of right upper lobectomy. No evidence of metastatic disease in the chest. 3. Generally large burden of stool in the distal colon with hyperenhancing, inflamed appearance of the distal colon and rectum (series 2, image 116). Findings are consistent with nonspecific infectious, inflammatory, or ischemic colitis, and differential considerations include drug toxicity in the oncologic setting. 4. Trace ascites in the low pelvis, nonspecific and likely reactive to adjacent inflammation. 5.  Coronary artery disease.  Aortic Atherosclerosis (ICD10-I70.0). 6.  Hepatic steatosis. Electronically Signed   By: AEddie CandleM.D.   On: 11/10/2018 10:12     ASSESSMENT AND PLAN:  This is a very pleasant 58years old  white male with metastatic non-small cell lung cancer, adenocarcinoma status post right upper lobectomy with lymph node dissection. Unfortunately the patient was found to have metastatic disease in the jejunum and terminal ileum.  He underwent surgical resection of the proximal and distal jejunum as well as the proximal ileum and the final pathology was consistent with  high-grade neuroendocrine carcinoma. The patient was started on treatment with systemic chemotherapy with carboplatin and Alimta for 2 cycles discontinued secondary to intolerance and disease progression. The patient completed treatment with second line immunotherapy with Ketruda (pembrolizumab) 200 mg IV every 3 weeks, status post 40 cycles.  He has been tolerating this treatment well. He had repeat CT scan of the chest, abdomen pelvis performed recently.  I personally and independently reviewed the scans and discussed the results with the patient and his wife. Unfortunately his scan showed evidence for disease progression with multiple new and enlarging retroperitoneal as well as abdominal lymphadenopathy. I recommended for the patient to discontinue his current treatment with Keytruda at this point. I discussed with him and his wife other option for treatment including palliative care and hospice referral versus consideration of systemic chemotherapy with docetaxel 75 mg/M2 in addition to Cyramza 10 mg/KG every 3 weeks.  The patient is interested in treatment.  I discussed with him the adverse effect of this treatment including but not limited to alopecia, myelosuppression, nausea and vomiting, peripheral neuropathy, liver or renal dysfunction. I will give him prescription for Decadron 4 mg p.o. twice daily the day before, day of and day after chemotherapy.  He was also advised to monitor his blood sugar closely during this time. He is expected to start the first cycle of his treatment next week. He will come back for follow-up visit in 4 weeks for evaluation with the start of cycle #2. For the diarrhea, he will continue on Imodium. For the diabetes mellitus he is followed by Dr. Daryll Drown at the [facility] internal medicine teaching program. The patient was advised to call immediately if he has any concerning symptoms in the interval. The patient voices understanding of current disease status and  treatment options and is in agreement with the current care plan. All questions were answered. The patient knows to call the clinic with any problems, questions or concerns. We can certainly see the patient much sooner if necessary.  Disclaimer: This note was dictated with voice recognition software. Similar sounding words can inadvertently be transcribed and may not be corrected upon review.

## 2018-11-12 NOTE — Telephone Encounter (Signed)
Called patient regarding upcoming appointments on 10/19, spoke with patient's wife and appointment has been confirmed.

## 2018-11-13 ENCOUNTER — Telehealth: Payer: Self-pay | Admitting: Internal Medicine

## 2018-11-13 ENCOUNTER — Telehealth: Payer: Self-pay | Admitting: *Deleted

## 2018-11-13 MED ORDER — OXYCODONE-ACETAMINOPHEN 10-325 MG PO TABS: 1.0000 | ORAL_TABLET | Freq: Four times a day (QID) | ORAL | 0 refills | Status: AC | PRN

## 2018-11-13 NOTE — Telephone Encounter (Signed)
Pt is having more pain; pls contact pt wife Zacarias Pontes  [phone]

## 2018-11-13 NOTE — Telephone Encounter (Signed)
Called pt's wife,Lora - stated he c/o back pain after helping in the yard; stated he feels like a kidney stones. Stated had CT scan on 10/20 - stated cancer is back. Requesting addition pain med to get it under control; Wants to stay on current regimen for pain.Stated does not want anything too strong. Thanks

## 2018-11-13 NOTE — Telephone Encounter (Signed)
Patient's wife notified of refill. Previous Rx was for 1-2 tabs q 6 hours prn. Wife states that is not holding back the pain. She is wondering if he could take the oxycodone more frequently or add another med for the break through pain. She thinks the pain is from the cancer that has recurred. Pt saw Dr. Julien Nordmann yesterday. Advised her to contact that office for help with pain management. Advised to call back if they are not able to get through to Dr. Julien Nordmann. Hubbard Hartshorn, BSN, RN-BC

## 2018-11-13 NOTE — Telephone Encounter (Signed)
Received vm message from pt regarding pain control concerns. Returned call to patient and spoke with his wife. She states that Valon is c/o ongoing and worsening back pain on the right side. Currently he is oxycodone 10mg /APAP 325 mg 1-2 tabs every 6 hours per his PCP. This was recently filled on 10/26/18 for 240 tabs. The patient states that it helps the pain for awhile, doesn't totally relieve it and lasts for only about 4 hours. Pt and wife are concerned that the increase in pain is d/t the progressive cancer noted on recent scans.  Pt still has diarrhea  4-5 x a day and taking lomotil.  Please advise

## 2018-11-13 NOTE — Telephone Encounter (Signed)
He can change to 1 tablet every 4 hours as needed.  Thank you.

## 2018-11-13 NOTE — Telephone Encounter (Signed)
I will send in an extra 30 tablets.  Need to monitor for sedation and respiratory issues.  Thanks.

## 2018-11-13 NOTE — Telephone Encounter (Signed)
Patients wife called back after speaking with nurse at Dr. Worthy Flank office. This RN made her aware that Dr. Julien Nordmann replied that patient can take the oxycodone q 4 hours prn. Dr. Worthy Flank nurse advised patient to go to ED if pain worsens and is not controlled with the oxycodone. Reiterated this to patient's wife. [facility] after hours info also given to patient's wife. She is very Patent attorney. Hubbard Hartshorn, BSN, RN-BC

## 2018-11-16 NOTE — Telephone Encounter (Signed)
Wife notified.

## 2018-11-18 ENCOUNTER — Telehealth: Payer: Self-pay | Admitting: *Deleted

## 2018-11-18 NOTE — Telephone Encounter (Signed)
Call to [facility] for PA for Oxycodone-Acetaminophen. Denied as prescription is for 7 days only and not 30.  Will approve with out a PA  for a 7 day supply of 28 if prescription is rewritten for the 7 day supply of 28 tablets.  Message to be sent to Dr. Daryll Drown to change dispensing number to 28.  Sander Nephew, RN 11/18/2018 4:32 PM.

## 2018-11-19 ENCOUNTER — Inpatient Hospital Stay: Payer: Medicare HMO

## 2018-11-19 ENCOUNTER — Inpatient Hospital Stay (HOSPITAL_BASED_OUTPATIENT_CLINIC_OR_DEPARTMENT_OTHER): Payer: Medicare HMO | Admitting: Medical

## 2018-11-19 ENCOUNTER — Other Ambulatory Visit: Payer: Self-pay

## 2018-11-19 VITALS — BP 130/70 | HR 68 | Temp 98.7°F | Resp 18

## 2018-11-19 DIAGNOSIS — T8090XA Unspecified complication following infusion and therapeutic injection, initial encounter: Secondary | ICD-10-CM

## 2018-11-19 DIAGNOSIS — Z79899 Other long term (current) drug therapy: Secondary | ICD-10-CM | POA: Diagnosis not present

## 2018-11-19 DIAGNOSIS — Z95828 Presence of other vascular implants and grafts: Secondary | ICD-10-CM

## 2018-11-19 DIAGNOSIS — C3491 Malignant neoplasm of unspecified part of right bronchus or lung: Secondary | ICD-10-CM

## 2018-11-19 DIAGNOSIS — Z5111 Encounter for antineoplastic chemotherapy: Secondary | ICD-10-CM | POA: Diagnosis not present

## 2018-11-19 DIAGNOSIS — Z5189 Encounter for other specified aftercare: Secondary | ICD-10-CM | POA: Diagnosis not present

## 2018-11-19 DIAGNOSIS — Z5112 Encounter for antineoplastic immunotherapy: Secondary | ICD-10-CM | POA: Diagnosis not present

## 2018-11-19 DIAGNOSIS — C784 Secondary malignant neoplasm of small intestine: Secondary | ICD-10-CM | POA: Diagnosis not present

## 2018-11-19 DIAGNOSIS — C3411 Malignant neoplasm of upper lobe, right bronchus or lung: Secondary | ICD-10-CM | POA: Diagnosis not present

## 2018-11-19 LAB — CBC WITH DIFFERENTIAL (CANCER CENTER ONLY)
Abs Immature Granulocytes: 0.04 10*3/uL (ref 0.00–0.07)
Basophils Absolute: 0 10*3/uL (ref 0.0–0.1)
Basophils Relative: 0 %
Eosinophils Absolute: 0 10*3/uL (ref 0.0–0.5)
Eosinophils Relative: 0 %
HCT: 41.1 % (ref 39.0–52.0)
Hemoglobin: 13.7 g/dL (ref 13.0–17.0)
Immature Granulocytes: 1 %
Lymphocytes Relative: 18 %
Lymphs Abs: 1.1 10*3/uL (ref 0.7–4.0)
MCH: 30.2 pg (ref 26.0–34.0)
MCHC: 33.3 g/dL (ref 30.0–36.0)
MCV: 90.5 fL (ref 80.0–100.0)
Monocytes Absolute: 0.6 10*3/uL (ref 0.1–1.0)
Monocytes Relative: 10 %
Neutro Abs: 4.3 10*3/uL (ref 1.7–7.7)
Neutrophils Relative %: 71 %
Platelet Count: 239 10*3/uL (ref 150–400)
RBC: 4.54 MIL/uL (ref 4.22–5.81)
RDW: 13 % (ref 11.5–15.5)
WBC Count: 6.1 10*3/uL (ref 4.0–10.5)
nRBC: 0 % (ref 0.0–0.2)

## 2018-11-19 LAB — CMP (CANCER CENTER ONLY)
ALT: 25 U/L (ref 0–44)
AST: 26 U/L (ref 15–41)
Albumin: 3.3 g/dL — ABNORMAL LOW (ref 3.5–5.0)
Alkaline Phosphatase: 110 U/L (ref 38–126)
Anion gap: 10 (ref 5–15)
BUN: 9 mg/dL (ref 6–20)
CO2: 27 mmol/L (ref 22–32)
Calcium: 8.9 mg/dL (ref 8.9–10.3)
Chloride: 99 mmol/L (ref 98–111)
Creatinine: 0.74 mg/dL (ref 0.61–1.24)
GFR, Est AFR Am: >60 mL/min (ref >60)
GFR, Estimated: >60 mL/min (ref >60)
Glucose, Bld: 322 mg/dL — ABNORMAL HIGH (ref 70–99)
Potassium: 4.5 mmol/L (ref 3.5–5.1)
Sodium: 136 mmol/L (ref 135–145)
Total Bilirubin: 0.4 mg/dL (ref 0.3–1.2)
Total Protein: 6.2 g/dL — ABNORMAL LOW (ref 6.5–8.1)

## 2018-11-19 LAB — TOTAL PROTEIN, URINE DIPSTICK: Protein, ur: NEGATIVE mg/dL

## 2018-11-19 MED ORDER — SODIUM CHLORIDE 0.9 % IV SOLN
10.0000 mg/kg | Freq: Once | INTRAVENOUS | Status: AC
  Administered 2018-11-19: 500 mg via INTRAVENOUS
  Filled 2018-11-19: qty 50

## 2018-11-19 MED ORDER — FAMOTIDINE IN NACL 20-0.9 MG/50ML-% IV SOLN
20.0000 mg | Freq: Once | INTRAVENOUS | Status: AC | PRN
  Administered 2018-11-19: 20 mg via INTRAVENOUS

## 2018-11-19 MED ORDER — DIPHENHYDRAMINE HCL 50 MG/ML IJ SOLN
25.0000 mg | Freq: Once | INTRAMUSCULAR | Status: AC
  Administered 2018-11-19: 25 mg via INTRAVENOUS

## 2018-11-19 MED ORDER — SODIUM CHLORIDE 0.9 % IV SOLN: 10.0000 mg | Freq: Once | INTRAVENOUS | Status: DC

## 2018-11-19 MED ORDER — SODIUM CHLORIDE 0.9% FLUSH
10.0000 mL | INTRAVENOUS | Status: DC | PRN
  Administered 2018-11-19: 10 mL
  Filled 2018-11-19: qty 10

## 2018-11-19 MED ORDER — SODIUM CHLORIDE 0.9 % IV SOLN
75.0000 mg/m2 | Freq: Once | INTRAVENOUS | Status: AC
  Administered 2018-11-19: 110 mg via INTRAVENOUS
  Filled 2018-11-19: qty 11

## 2018-11-19 MED ORDER — DIPHENHYDRAMINE HCL 50 MG/ML IJ SOLN
50.0000 mg | Freq: Once | INTRAMUSCULAR | Status: AC
  Administered 2018-11-19: 50 mg via INTRAVENOUS

## 2018-11-19 MED ORDER — SODIUM CHLORIDE 0.9% FLUSH
10.0000 mL | INTRAVENOUS | Status: DC | PRN
  Administered 2018-11-19: 09:00:00 10 mL
  Filled 2018-11-19: qty 10

## 2018-11-19 MED ORDER — DEXAMETHASONE SODIUM PHOSPHATE 10 MG/ML IJ SOLN
INTRAMUSCULAR | Status: AC
  Filled 2018-11-19: qty 1

## 2018-11-19 MED ORDER — ACETAMINOPHEN 325 MG PO TABS
650.0000 mg | ORAL_TABLET | Freq: Once | ORAL | Status: AC
  Administered 2018-11-19: 650 mg via ORAL

## 2018-11-19 MED ORDER — DIPHENHYDRAMINE HCL 50 MG/ML IJ SOLN: 50.0000 mg | Freq: Once | INTRAMUSCULAR | Status: DC | PRN

## 2018-11-19 MED ORDER — HEPARIN SOD (PORK) LOCK FLUSH 100 UNIT/ML IV SOLN
500.0000 [IU] | Freq: Once | INTRAVENOUS | Status: AC | PRN
  Administered 2018-11-19: 500 [IU]
  Filled 2018-11-19: qty 5

## 2018-11-19 MED ORDER — DEXAMETHASONE SODIUM PHOSPHATE 10 MG/ML IJ SOLN
10.0000 mg | Freq: Once | INTRAMUSCULAR | Status: AC
  Administered 2018-11-19: 10 mg via INTRAVENOUS

## 2018-11-19 MED ORDER — SODIUM CHLORIDE 0.9 % IV SOLN
Freq: Once | INTRAVENOUS | Status: DC | PRN
  Administered 2018-11-19: 12:00:00 via INTRAVENOUS
  Filled 2018-11-19: qty 250

## 2018-11-19 MED ORDER — ACETAMINOPHEN 325 MG PO TABS
ORAL_TABLET | ORAL | Status: AC
  Filled 2018-11-19: qty 2

## 2018-11-19 MED ORDER — SODIUM CHLORIDE 0.9 % IV SOLN
Freq: Once | INTRAVENOUS | Status: AC
  Administered 2018-11-19: 10:00:00 via INTRAVENOUS
  Filled 2018-11-19: qty 250

## 2018-11-19 MED ORDER — DIPHENHYDRAMINE HCL 50 MG/ML IJ SOLN
INTRAMUSCULAR | Status: AC
  Filled 2018-11-19: qty 1

## 2018-11-19 NOTE — Telephone Encounter (Signed)
Thank you :)

## 2018-11-19 NOTE — Patient Instructions (Signed)

## 2018-11-19 NOTE — Progress Notes (Signed)
Patient presents today for first time Cyramza and Taxotere. During Cyramza infusion, he started complaining of generalized itching with some visible hives. Denied any other concerns or reactions. Infusion stopped, NS IV fluid started at 969ml, Lucianne Lei, [place] at chairside, Pepcid 20 mg IVPB and Benadryl 25 mg injection administered. Vitals stable and documented on flowsheet. Per Lucianne Lei, PA, restart Cyramza and run over 2 hours. Patient tolerated the rest of the Cyramza and Taxotere infusion with no further complain or concerns.

## 2018-11-19 NOTE — Patient Instructions (Signed)
[facility] Discharge Instructions for Patients Receiving Chemotherapy  Today you received the following chemotherapy agents Ramucirumab (CYRAMZA) & Docetaxel (TAXOTERE).  To help prevent nausea and vomiting after your treatment, we encourage you to take your nausea medication as prescribed.   If you develop nausea and vomiting that is not controlled by your nausea medication, call the clinic.   BELOW ARE SYMPTOMS THAT SHOULD BE REPORTED IMMEDIATELY:  *FEVER GREATER THAN 100.5 F  *CHILLS WITH OR WITHOUT FEVER  NAUSEA AND VOMITING THAT IS NOT CONTROLLED WITH YOUR NAUSEA MEDICATION  *UNUSUAL SHORTNESS OF BREATH  *UNUSUAL BRUISING OR BLEEDING  TENDERNESS IN MOUTH AND THROAT WITH OR WITHOUT PRESENCE OF ULCERS  *URINARY PROBLEMS  *BOWEL PROBLEMS  UNUSUAL RASH Items with * indicate a potential emergency and should be followed up as soon as possible.  Feel free to call the clinic should you have any questions or concerns. The clinic phone number is [phone].  Please show the [place] at check-in to the Emergency Department and triage nurse.  Ramucirumab injection What is this medicine? RAMUCIRUMAB (ra mue SIR ue mab) is a monoclonal antibody. It is used to treat stomach cancer, colorectal cancer, liver cancer, and lung cancer. This medicine may be used for other purposes; ask your health care provider or pharmacist if you have questions. COMMON BRAND NAME(S): Cyramza What should I tell my health care provider before I take this medicine? They need to know if you have any of these conditions:  bleeding disorders  blood clots  heart disease, including heart failure, heart attack, or chest pain (angina)  high blood pressure  infection (especially a virus infection such as chickenpox, cold sores, or herpes)  protein in your urine  recent or planning to have surgery  stroke  an unusual or allergic reaction to ramucirumab, other medicines,  foods, dyes, or preservatives  pregnant or trying to get pregnant  breast-feeding How should I use this medicine? This medicine is for infusion into a vein. It is given by a health care professional in a hospital or clinic setting. Talk to your pediatrician regarding the use of this medicine in children. Special care may be needed. Overdosage: If you think you have taken too much of this medicine contact a poison control center or emergency room at once. NOTE: This medicine is only for you. Do not share this medicine with others. What if I miss a dose? It is important not to miss your dose. Call your doctor or health care professional if you are unable to keep an appointment. What may interact with this medicine? Interactions have not been studied. This list may not describe all possible interactions. Give your health care provider a list of all the medicines, herbs, non-prescription drugs, or dietary supplements you use. Also tell them if you smoke, drink alcohol, or use illegal drugs. Some items may interact with your medicine. What should I watch for while using this medicine? Your condition will be monitored carefully while you are receiving this medicine. You will need to to check your blood pressure and have your blood and urine tested while you are taking this medicine. Your condition will be monitored carefully while you are receiving this medicine. This medicine may increase your risk to bruise or bleed. Call your doctor or health care professional if you notice any unusual bleeding. This medicine may rarely cause 'gastrointestinal perforation' (holes in the stomach, intestines or colon), a serious side effect requiring surgery to repair. This medicine  should be started at least 2 weeks following major surgery and the site of the surgery should be totally healed. Check with your doctor before scheduling dental work or surgery while you are receiving this treatment. Talk to your doctor if  you have recently had surgery or if you have a wound that has not healed. Do not become pregnant while taking this medicine or for 3 months after stopping it. Women should inform their doctor if they wish to become pregnant or think they might be pregnant. There is a potential for serious side effects to an unborn child. Talk to your health care professional or pharmacist for more information. Do not breast-feed an infant while taking this medicine or for 2 months after stopping it. This medicine may interfere with the ability to have a child. Talk with your doctor or health care professional if you are concerned about your fertility. What side effects may I notice from receiving this medicine? Side effects that you should report to your doctor or health care professional as soon as possible:  allergic reactions like skin rash, itching or hives, breathing problems, swelling of the face, lips, or tongue  signs of infection - fever or chills, cough, sore throat  chest pain or chest tightness  confusion  dizziness  feeling faint or lightheaded, falls  severe abdominal pain  severe nausea, vomiting  signs and symptoms of bleeding such as bloody or black, tarry stools; red or dark-brown urine; spitting up blood or brown material that looks like coffee grounds; red spots on the skin; unusual bruising or bleeding from the eye, gums, or nose  signs and symptoms of a blood clot such as breathing problems; changes in vision; chest pain; severe, sudden headache; pain, swelling, warmth in the leg; trouble speaking; sudden numbness or weakness of the face, arm or leg  symptoms of a stroke: change in mental awareness, inability to talk or move one side of the body  trouble walking, dizziness, loss of balance or coordination Side effects that usually do not require medical attention (report to your doctor or health care professional if they continue or are bothersome):  cold, clammy  skin  constipation  diarrhea  headache  nausea, vomiting  stomach pain  unusually slow heartbeat  unusually weak or tired This list may not describe all possible side effects. Call your doctor for medical advice about side effects. You may report side effects to FDA at 1-800-FDA-1088. Where should I keep my medicine? This drug is given in a hospital or clinic and will not be stored at home. NOTE: This sheet is a summary. It may not cover all possible information. If you have questions about this medicine, talk to your doctor, pharmacist, or health care provider.  2020 Elsevier/Gold Standard (2018-03-16 11:44:01)  Docetaxel injection What is this medicine? DOCETAXEL (doe se TAX el) is a chemotherapy drug. It targets fast dividing cells, like cancer cells, and causes these cells to die. This medicine is used to treat many types of cancers like breast cancer, certain stomach cancers, head and neck cancer, lung cancer, and prostate cancer. This medicine may be used for other purposes; ask your health care provider or pharmacist if you have questions. COMMON BRAND NAME(S): Docefrez, Taxotere What should I tell my health care provider before I take this medicine? They need to know if you have any of these conditions:  infection (especially a virus infection such as chickenpox, cold sores, or herpes)  liver disease  low blood counts, like  low white cell, platelet, or red cell counts  an unusual or allergic reaction to docetaxel, polysorbate 80, other chemotherapy agents, other medicines, foods, dyes, or preservatives  pregnant or trying to get pregnant  breast-feeding How should I use this medicine? This drug is given as an infusion into a vein. It is administered in a hospital or clinic by a specially trained health care professional. Talk to your pediatrician regarding the use of this medicine in children. Special care may be needed. Overdosage: If you think you have taken too  much of this medicine contact a poison control center or emergency room at once. NOTE: This medicine is only for you. Do not share this medicine with others. What if I miss a dose? It is important not to miss your dose. Call your doctor or health care professional if you are unable to keep an appointment. What may interact with this medicine?  aprepitant  certain antibiotics like erythromycin or clarithromycin  certain antivirals for HIV or hepatitis  certain medicines for fungal infections like fluconazole, itraconazole, ketoconazole, posaconazole, or voriconazole  cimetidine  ciprofloxacin  conivaptan  cyclosporine  dronedarone  fluvoxamine  grapefruit juice  imatinib  verapamil This list may not describe all possible interactions. Give your health care provider a list of all the medicines, herbs, non-prescription drugs, or dietary supplements you use. Also tell them if you smoke, drink alcohol, or use illegal drugs. Some items may interact with your medicine. What should I watch for while using this medicine? Your condition will be monitored carefully while you are receiving this medicine. You will need important blood work done while you are taking this medicine. Call your doctor or health care professional for advice if you get a fever, chills or sore throat, or other symptoms of a cold or flu. Do not treat yourself. This drug decreases your body's ability to fight infections. Try to avoid being around people who are sick. Some products may contain alcohol. Ask your health care professional if this medicine contains alcohol. Be sure to tell all health care professionals you are taking this medicine. Certain medicines, like metronidazole and disulfiram, can cause an unpleasant reaction when taken with alcohol. The reaction includes flushing, headache, nausea, vomiting, sweating, and increased thirst. The reaction can last from 30 minutes to several hours. You may get drowsy or  dizzy. Do not drive, use machinery, or do anything that needs mental alertness until you know how this medicine affects you. Do not stand or sit up quickly, especially if you are an older patient. This reduces the risk of dizzy or fainting spells. Alcohol may interfere with the effect of this medicine. Talk to your health care professional about your risk of cancer. You may be more at risk for certain types of cancer if you take this medicine. Do not become pregnant while taking this medicine or for 6 months after stopping it. Women should inform their doctor if they wish to become pregnant or think they might be pregnant. There is a potential for serious side effects to an unborn child. Talk to your health care professional or pharmacist for more information. Do not breast-feed an infant while taking this medicine or for 1 to 2 weeks after stopping it. Males who get this medicine must use a condom during sex with females who can get pregnant. If you get a woman pregnant, the baby could have birth defects. The baby could die before they are born. You will need to continue wearing a condom  for 3 months after stopping the medicine. Tell your health care provider right away if your partner becomes pregnant while you are taking this medicine. This may interfere with the ability to father a child. You should talk to your doctor or health care professional if you are concerned about your fertility. What side effects may I notice from receiving this medicine? Side effects that you should report to your doctor or health care professional as soon as possible:  allergic reactions like skin rash, itching or hives, swelling of the face, lips, or tongue  blurred vision  breathing problems  changes in vision  low blood counts - This drug may decrease the number of white blood cells, red blood cells and platelets. You may be at increased risk for infections and bleeding.  nausea and vomiting  pain, redness or  irritation at site where injected  pain, tingling, numbness in the hands or feet  redness, blistering, peeling, or loosening of the skin, including inside the mouth  signs of decreased platelets or bleeding - bruising, pinpoint red spots on the skin, black, tarry stools, nosebleeds  signs of decreased red blood cells - unusually weak or tired, fainting spells, lightheadedness  signs of infection - fever or chills, cough, sore throat, pain or difficulty passing urine  swelling of the ankle, feet, hands Side effects that usually do not require medical attention (report to your doctor or health care professional if they continue or are bothersome):  constipation  diarrhea  fingernail or toenail changes  hair loss  loss of appetite  mouth sores  muscle pain This list may not describe all possible side effects. Call your doctor for medical advice about side effects. You may report side effects to FDA at 1-800-FDA-1088. Where should I keep my medicine? This drug is given in a hospital or clinic and will not be stored at home. NOTE: This sheet is a summary. It may not cover all possible information. If you have questions about this medicine, talk to your doctor, pharmacist, or health care provider.  2020 Elsevier/Gold Standard (2018-03-13 12:23:11)  Coronavirus (COVID-19) Are you at risk?  Are you at risk for the Coronavirus (COVID-19)?  To be considered HIGH RISK for Coronavirus (COVID-19), you have to meet the following criteria:  . Traveled to [place], [place], [place], [place] or [place]; or in the [place] to [place], [place], [place], or [place]; and have fever, cough, and shortness of breath within the last 2 weeks of travel OR . Been in close contact with a person diagnosed with COVID-19 within the last 2 weeks and have fever, cough, and shortness of breath . IF YOU DO NOT MEET THESE CRITERIA, YOU ARE CONSIDERED LOW RISK FOR COVID-19.  What to do if you are  HIGH RISK for COVID-19?  Marland Kitchen If you are having a medical emergency, call 911. . Seek medical care right away. Before you go to a doctor's office, urgent care or emergency department, call ahead and tell them about your recent travel, contact with someone diagnosed with COVID-19, and your symptoms. You should receive instructions from your physician's office regarding next steps of care.  . When you arrive at healthcare provider, tell the healthcare staff immediately you have returned from visiting [place], [place], [place], [place] or [place]; or traveled in the [place] to [place], [place], [place], or [place]; in the last two weeks or you have been in close contact with a person diagnosed with COVID-19 in  the last 2 weeks.   . Tell the health care staff about your symptoms: fever, cough and shortness of breath. . After you have been seen by a medical provider, you will be either: o Tested for (COVID-19) and discharged home on quarantine except to seek medical care if symptoms worsen, and asked to  - Stay home and avoid contact with others until you get your results (4-5 days)  - Avoid travel on public transportation if possible (such as bus, train, or airplane) or o Sent to the Emergency Department by EMS for evaluation, COVID-19 testing, and possible admission depending on your condition and test results.  What to do if you are LOW RISK for COVID-19?  Reduce your risk of any infection by using the same precautions used for avoiding the common cold or flu:  Marland Kitchen Wash your hands often with soap and warm water for at least 20 seconds.  If soap and water are not readily available, use an alcohol-based hand sanitizer with at least 60% alcohol.  . If coughing or sneezing, cover your mouth and nose by coughing or sneezing into the elbow areas of your shirt or coat, into a tissue or into your sleeve (not your hands). . Avoid shaking hands with others and consider head nods or verbal greetings  only. . Avoid touching your eyes, nose, or mouth with unwashed hands.  . Avoid close contact with people who are sick. . Avoid places or events with large numbers of people in one location, like concerts or sporting events. . Carefully consider travel plans you have or are making. . If you are planning any travel outside or inside the [place], visit the CDC's Travelers' Health webpage for the latest health notices. . If you have some symptoms but not all symptoms, continue to monitor at home and seek medical attention if your symptoms worsen. . If you are having a medical emergency, call 911.   [place] / e-Visit: [email or web address]         [facility]: [facility]: [phone]                   [facility]: [phone]

## 2018-11-20 ENCOUNTER — Telehealth: Payer: Self-pay | Admitting: *Deleted

## 2018-11-20 NOTE — Progress Notes (Signed)
    DATE:  11/19/2018                                          X  CHEMO/IMMUNOTHERAPY REACTION            MD:                 Dr. Fanny Bien. Mohamed  AGENT/BLOOD PRODUCT RECEIVING TODAY:              Cyramza   AGENT/BLOOD PRODUCT RECEIVING IMMEDIATELY PRIOR TO REACTION:          Cyramza   VS: BP:     127/68   P:       69       SPO2:       99% on room air                  REACTION(S):          Itching and hives.   PREMEDS:      Tylenol 650 mg p.o. x1, Benadryl 50 mg p.o. x1 and dexamethasone 10 mg IV x1.   INTERVENTION: Cyramza was paused and the patient was given Pepcid 20 mg IV x1 and Benadryl 25 mg IV x1.  Review of Systems  Review of Systems  Constitutional: Negative for chills, diaphoresis and fever.  HENT: Negative for facial swelling, trouble swallowing and voice change.   Respiratory: Negative for cough, chest tightness, shortness of breath and wheezing.   Cardiovascular: Negative for chest pain and palpitations.  Gastrointestinal: Negative for abdominal pain, constipation, diarrhea, nausea and vomiting.  Musculoskeletal: Negative for back pain and myalgias.  Skin: Positive for rash.       Pruritus  Neurological: Negative for dizziness, light-headedness and headaches.     Physical Exam  Physical Exam Constitutional:      General: He is not in acute distress.    Appearance: He is not diaphoretic.  HENT:     Head: Normocephalic and atraumatic.  Cardiovascular:     Rate and Rhythm: Normal rate and regular rhythm.     Heart sounds: Normal heart sounds. No murmur. No friction rub. No gallop.   Pulmonary:     Effort: Pulmonary effort is normal. No respiratory distress.     Breath sounds: Normal breath sounds. No wheezing or rales.  Skin:    General: Skin is warm and dry.     Findings: Erythema and rash present.  Neurological:     Mental Status: He is alert.        OUTCOME:                 The patient's symptoms resolved after dosing with Pepcid and Benadryl.   His Cyramza was changed from dosing over 1 hour to dosing over 2 hours.  He was able to complete his treatment without any other issues of concern.  This case was discussed with Dr. Julien Nordmann. He expressed agreement with my management of this patient.  Sandi Mealy, MHS, PA-C

## 2018-11-20 NOTE — Telephone Encounter (Signed)
Called pt to discuss how he did with his treatment yesterday.  He wanted me to speak with his wife, Zacarias Pontes.  She reports that he slept well, no more itching, & groggy from meds yesterday but no other symptoms except some R eye soreness which he attributes to how he slept.  He will let us know if anything changes with that.  She states he is eating & drinking well.  Discussed Claritin with Pegfilgrastim shot tomorrow & explained. She had a lot of questions & concerns that were answered.  She would like to come for next appt with Dr Julien Nordmann b/c she says that Coralyn Mark had a head injury a long time back & his memory is not that good & even though Dr Julien Nordmann called her, she would prefer to come to visit.  Message routed to Dr Mohamed/Pod RN for verification if she can attend next visit.

## 2018-11-20 NOTE — Telephone Encounter (Signed)
-----   Message from Georgianne Fick, RN sent at 11/19/2018  4:38 PM EDT ----- Regarding: Dr. Worthy Flank First Time Ramucirumab & Docetaxel Patient received first time Ramucirumab and Docetaxel today, experienced itching with the Cyramza, tolerated the rest of the treatment with no further complain or concerns.

## 2018-11-21 ENCOUNTER — Inpatient Hospital Stay: Payer: Medicare HMO

## 2018-11-21 VITALS — BP 98/75 | HR 90 | Temp 98.3°F | Resp 18

## 2018-11-21 DIAGNOSIS — Z5111 Encounter for antineoplastic chemotherapy: Secondary | ICD-10-CM | POA: Diagnosis not present

## 2018-11-21 DIAGNOSIS — C3411 Malignant neoplasm of upper lobe, right bronchus or lung: Secondary | ICD-10-CM | POA: Diagnosis not present

## 2018-11-21 DIAGNOSIS — Z79899 Other long term (current) drug therapy: Secondary | ICD-10-CM | POA: Diagnosis not present

## 2018-11-21 DIAGNOSIS — C784 Secondary malignant neoplasm of small intestine: Secondary | ICD-10-CM | POA: Diagnosis not present

## 2018-11-21 DIAGNOSIS — Z5189 Encounter for other specified aftercare: Secondary | ICD-10-CM | POA: Diagnosis not present

## 2018-11-21 DIAGNOSIS — Z5112 Encounter for antineoplastic immunotherapy: Secondary | ICD-10-CM | POA: Diagnosis not present

## 2018-11-21 MED ORDER — PEGFILGRASTIM-JMDB 6 MG/0.6ML ~~LOC~~ SOSY
6.0000 mg | PREFILLED_SYRINGE | Freq: Once | SUBCUTANEOUS | Status: AC
  Administered 2018-11-21: 6 mg via SUBCUTANEOUS

## 2018-11-21 NOTE — Patient Instructions (Signed)

## 2018-11-21 NOTE — Progress Notes (Signed)
Spoke with patient regarding the need of fluids, he stated he would be fine and if there was an issue he would report to the local ED or give [place] a call on Monday. I also spoke with his wife and she agreed with our recommendations.

## 2018-11-23 NOTE — Telephone Encounter (Signed)
I have pended the order, the online Rx for narcotics is not working.  Will try to send again.

## 2018-11-24 ENCOUNTER — Other Ambulatory Visit: Payer: Self-pay | Admitting: Internal Medicine

## 2018-11-24 DIAGNOSIS — K2270 Barrett's esophagus without dysplasia: Secondary | ICD-10-CM

## 2018-12-03 ENCOUNTER — Other Ambulatory Visit: Payer: Medicare HMO

## 2018-12-03 ENCOUNTER — Ambulatory Visit: Payer: Medicare HMO

## 2018-12-03 ENCOUNTER — Ambulatory Visit: Payer: Medicare HMO | Admitting: Internal Medicine

## 2018-12-05 ENCOUNTER — Telehealth: Payer: Self-pay | Admitting: Internal Medicine

## 2018-12-05 NOTE — Telephone Encounter (Signed)
  Reason for call:   I placed an outgoing call to [name] [name] at 9:30 PM regarding left lower leg swelling and hypoglycemia.   Assessment/ Plan:   Spoke with wife who provided the history.   Brett [name] has a history of non-small cell lung cancer, adenocarcinoma with metastasis(currently receiving chemotherapy), DMII, PVD, and HLD. Had chemo treatment 2 weeks ago on 10/29. They report that on Thursday or Wednesday night he started having left leg swelling, from his knee to his foot, they report some swelling in his right but not as bad as his left. Has a stent in his left leg for his PVD. He always has pain in that leg and is unsure if it is worsening, they do report pain to palpation. They deny any changes in the color, denies any redness or warmth. He is on aspirin daily at home. Has been moving a little bit, but on some days he has been less active and staying any laying down more recently. Wells score of at least 3, he is at least in the moderate range. Recommended for him to come in to get an ultrasound to be evaluated.   They also report episodes of hypoglycemia. He was switched to a chemo medication that needs to have close control of his blood sugars. She reports CBGs down to the 50-60s in the morning despite what he eats. They report some episodes of light headedness and dizziness. He will often forget his medications and has not been using any insulin but his blood sugars are still low. He is supposed to be on insulin, glipizide, sitagliptin and pioglitazone at home. Has been eating okay, he does not check his blood sugars in the afternoon. Last A1c was 11.1, pioglitazone was started on 10/2. Advised to hold pioglitazone for now due to risk of hypoglycemia. Continue glipizide and sitagliptin for now.   As always, pt is advised that if symptoms worsen or new symptoms arise, they should go to an urgent care facility or to to ER for further evaluation.   Asencion Noble, MD   12/05/2018,  9:23 PM

## 2018-12-06 ENCOUNTER — Emergency Department (HOSPITAL_COMMUNITY): Payer: Medicare HMO

## 2018-12-06 ENCOUNTER — Encounter (HOSPITAL_COMMUNITY): Payer: Self-pay | Admitting: *Deleted

## 2018-12-06 ENCOUNTER — Observation Stay (HOSPITAL_BASED_OUTPATIENT_CLINIC_OR_DEPARTMENT_OTHER): Payer: Medicare HMO

## 2018-12-06 ENCOUNTER — Other Ambulatory Visit: Payer: Self-pay

## 2018-12-06 ENCOUNTER — Inpatient Hospital Stay (HOSPITAL_COMMUNITY)
Admission: EM | Admit: 2018-12-06 | Discharge: 2018-12-09 | DRG: 392 | Disposition: A | Discharge status: Home / Self Care | Payer: Medicare HMO | Attending: Internal Medicine | Admitting: Internal Medicine

## 2018-12-06 DIAGNOSIS — R0600 Dyspnea, unspecified: Secondary | ICD-10-CM | POA: Diagnosis not present

## 2018-12-06 DIAGNOSIS — E46 Unspecified protein-calorie malnutrition: Secondary | ICD-10-CM | POA: Diagnosis not present

## 2018-12-06 DIAGNOSIS — I9589 Other hypotension: Secondary | ICD-10-CM | POA: Diagnosis present

## 2018-12-06 DIAGNOSIS — D849 Immunodeficiency, unspecified: Secondary | ICD-10-CM | POA: Diagnosis present

## 2018-12-06 DIAGNOSIS — E1140 Type 2 diabetes mellitus with diabetic neuropathy, unspecified: Secondary | ICD-10-CM | POA: Diagnosis present

## 2018-12-06 DIAGNOSIS — I452 Bifascicular block: Secondary | ICD-10-CM | POA: Diagnosis not present

## 2018-12-06 DIAGNOSIS — T451X5A Adverse effect of antineoplastic and immunosuppressive drugs, initial encounter: Secondary | ICD-10-CM | POA: Diagnosis present

## 2018-12-06 DIAGNOSIS — Z886 Allergy status to analgesic agent status: Secondary | ICD-10-CM

## 2018-12-06 DIAGNOSIS — G894 Chronic pain syndrome: Secondary | ICD-10-CM | POA: Diagnosis present

## 2018-12-06 DIAGNOSIS — R600 Localized edema: Secondary | ICD-10-CM

## 2018-12-06 DIAGNOSIS — Z902 Acquired absence of lung [part of]: Secondary | ICD-10-CM

## 2018-12-06 DIAGNOSIS — R609 Edema, unspecified: Secondary | ICD-10-CM

## 2018-12-06 DIAGNOSIS — I950 Idiopathic hypotension: Secondary | ICD-10-CM

## 2018-12-06 DIAGNOSIS — Z20828 Contact with and (suspected) exposure to other viral communicable diseases: Secondary | ICD-10-CM | POA: Diagnosis not present

## 2018-12-06 DIAGNOSIS — I959 Hypotension, unspecified: Secondary | ICD-10-CM

## 2018-12-06 DIAGNOSIS — L8990 Pressure ulcer of unspecified site, unspecified stage: Secondary | ICD-10-CM | POA: Insufficient documentation

## 2018-12-06 DIAGNOSIS — Z95820 Peripheral vascular angioplasty status with implants and grafts: Secondary | ICD-10-CM

## 2018-12-06 DIAGNOSIS — R933 Abnormal findings on diagnostic imaging of other parts of digestive tract: Secondary | ICD-10-CM | POA: Diagnosis not present

## 2018-12-06 DIAGNOSIS — Z79890 Hormone replacement therapy (postmenopausal): Secondary | ICD-10-CM

## 2018-12-06 DIAGNOSIS — R64 Cachexia: Secondary | ICD-10-CM | POA: Diagnosis not present

## 2018-12-06 DIAGNOSIS — Z91041 Radiographic dye allergy status: Secondary | ICD-10-CM | POA: Diagnosis not present

## 2018-12-06 DIAGNOSIS — F17210 Nicotine dependence, cigarettes, uncomplicated: Secondary | ICD-10-CM | POA: Diagnosis present

## 2018-12-06 DIAGNOSIS — E1151 Type 2 diabetes mellitus with diabetic peripheral angiopathy without gangrene: Secondary | ICD-10-CM | POA: Diagnosis present

## 2018-12-06 DIAGNOSIS — D72829 Elevated white blood cell count, unspecified: Secondary | ICD-10-CM | POA: Diagnosis not present

## 2018-12-06 DIAGNOSIS — Z87820 Personal history of traumatic brain injury: Secondary | ICD-10-CM

## 2018-12-06 DIAGNOSIS — K8020 Calculus of gallbladder without cholecystitis without obstruction: Secondary | ICD-10-CM | POA: Diagnosis not present

## 2018-12-06 DIAGNOSIS — E785 Hyperlipidemia, unspecified: Secondary | ICD-10-CM | POA: Diagnosis not present

## 2018-12-06 DIAGNOSIS — K5900 Constipation, unspecified: Secondary | ICD-10-CM | POA: Diagnosis present

## 2018-12-06 DIAGNOSIS — K5289 Other specified noninfective gastroenteritis and colitis: Principal | ICD-10-CM | POA: Diagnosis present

## 2018-12-06 DIAGNOSIS — C784 Secondary malignant neoplasm of small intestine: Secondary | ICD-10-CM | POA: Diagnosis not present

## 2018-12-06 DIAGNOSIS — C3491 Malignant neoplasm of unspecified part of right bronchus or lung: Secondary | ICD-10-CM | POA: Diagnosis present

## 2018-12-06 DIAGNOSIS — Z79891 Long term (current) use of opiate analgesic: Secondary | ICD-10-CM | POA: Diagnosis not present

## 2018-12-06 DIAGNOSIS — Z681 Body mass index (BMI) 19 or less, adult: Secondary | ICD-10-CM | POA: Diagnosis not present

## 2018-12-06 DIAGNOSIS — E872 Acidosis: Secondary | ICD-10-CM | POA: Diagnosis not present

## 2018-12-06 DIAGNOSIS — C3490 Malignant neoplasm of unspecified part of unspecified bronchus or lung: Secondary | ICD-10-CM

## 2018-12-06 DIAGNOSIS — K760 Fatty (change of) liver, not elsewhere classified: Secondary | ICD-10-CM | POA: Diagnosis not present

## 2018-12-06 DIAGNOSIS — D72828 Other elevated white blood cell count: Secondary | ICD-10-CM | POA: Diagnosis present

## 2018-12-06 DIAGNOSIS — R748 Abnormal levels of other serum enzymes: Secondary | ICD-10-CM | POA: Diagnosis not present

## 2018-12-06 DIAGNOSIS — Z7982 Long term (current) use of aspirin: Secondary | ICD-10-CM

## 2018-12-06 DIAGNOSIS — N39490 Overflow incontinence: Secondary | ICD-10-CM | POA: Diagnosis present

## 2018-12-06 DIAGNOSIS — Z95828 Presence of other vascular implants and grafts: Secondary | ICD-10-CM

## 2018-12-06 DIAGNOSIS — K5903 Drug induced constipation: Secondary | ICD-10-CM | POA: Diagnosis not present

## 2018-12-06 DIAGNOSIS — Z888 Allergy status to other drugs, medicaments and biological substances status: Secondary | ICD-10-CM

## 2018-12-06 DIAGNOSIS — K529 Noninfective gastroenteritis and colitis, unspecified: Secondary | ICD-10-CM | POA: Diagnosis present

## 2018-12-06 DIAGNOSIS — Z8701 Personal history of pneumonia (recurrent): Secondary | ICD-10-CM

## 2018-12-06 DIAGNOSIS — Z833 Family history of diabetes mellitus: Secondary | ICD-10-CM

## 2018-12-06 DIAGNOSIS — Z9221 Personal history of antineoplastic chemotherapy: Secondary | ICD-10-CM

## 2018-12-06 DIAGNOSIS — Z794 Long term (current) use of insulin: Secondary | ICD-10-CM

## 2018-12-06 DIAGNOSIS — Z03818 Encounter for observation for suspected exposure to other biological agents ruled out: Secondary | ICD-10-CM | POA: Diagnosis not present

## 2018-12-06 DIAGNOSIS — E860 Dehydration: Secondary | ICD-10-CM | POA: Diagnosis present

## 2018-12-06 DIAGNOSIS — E039 Hypothyroidism, unspecified: Secondary | ICD-10-CM | POA: Diagnosis not present

## 2018-12-06 DIAGNOSIS — Z8249 Family history of ischemic heart disease and other diseases of the circulatory system: Secondary | ICD-10-CM

## 2018-12-06 DIAGNOSIS — Z79899 Other long term (current) drug therapy: Secondary | ICD-10-CM

## 2018-12-06 DIAGNOSIS — J811 Chronic pulmonary edema: Secondary | ICD-10-CM | POA: Diagnosis not present

## 2018-12-06 DIAGNOSIS — I739 Peripheral vascular disease, unspecified: Secondary | ICD-10-CM | POA: Diagnosis not present

## 2018-12-06 LAB — CBC
HCT: 39.4 % (ref 39.0–52.0)
Hemoglobin: 13.1 g/dL (ref 13.0–17.0)
MCH: 31.3 pg (ref 26.0–34.0)
MCHC: 33.2 g/dL (ref 30.0–36.0)
MCV: 94.3 fL (ref 80.0–100.0)
Platelets: 201 10*3/uL (ref 150–400)
RBC: 4.18 MIL/uL — ABNORMAL LOW (ref 4.22–5.81)
RDW: 13.3 % (ref 11.5–15.5)
WBC: 17.7 10*3/uL — ABNORMAL HIGH (ref 4.0–10.5)
nRBC: 0 % (ref 0.0–0.2)

## 2018-12-06 LAB — CBC WITH DIFFERENTIAL/PLATELET
Abs Immature Granulocytes: 0.19 10*3/uL — ABNORMAL HIGH (ref 0.00–0.07)
Basophils Absolute: 0.1 10*3/uL (ref 0.0–0.1)
Basophils Relative: 1 %
Eosinophils Absolute: 0 10*3/uL (ref 0.0–0.5)
Eosinophils Relative: 0 %
HCT: 40.3 % (ref 39.0–52.0)
Hemoglobin: 13 g/dL (ref 13.0–17.0)
Immature Granulocytes: 1 %
Lymphocytes Relative: 8 %
Lymphs Abs: 1.2 10*3/uL (ref 0.7–4.0)
MCH: 30.7 pg (ref 26.0–34.0)
MCHC: 32.3 g/dL (ref 30.0–36.0)
MCV: 95.3 fL (ref 80.0–100.0)
Monocytes Absolute: 0.7 10*3/uL (ref 0.1–1.0)
Monocytes Relative: 5 %
Neutro Abs: 13.3 10*3/uL — ABNORMAL HIGH (ref 1.7–7.7)
Neutrophils Relative %: 85 %
Platelets: 197 10*3/uL (ref 150–400)
RBC: 4.23 MIL/uL (ref 4.22–5.81)
RDW: 13.5 % (ref 11.5–15.5)
WBC: 15.4 10*3/uL — ABNORMAL HIGH (ref 4.0–10.5)
nRBC: 0 % (ref 0.0–0.2)

## 2018-12-06 LAB — COMPREHENSIVE METABOLIC PANEL
ALT: 20 U/L (ref 0–44)
AST: 24 U/L (ref 15–41)
Albumin: 2.8 g/dL — ABNORMAL LOW (ref 3.5–5.0)
Alkaline Phosphatase: 162 U/L — ABNORMAL HIGH (ref 38–126)
Anion gap: 9 (ref 5–15)
BUN: 7 mg/dL (ref 6–20)
CO2: 28 mmol/L (ref 22–32)
Calcium: 8.3 mg/dL — ABNORMAL LOW (ref 8.9–10.3)
Chloride: 100 mmol/L (ref 98–111)
Creatinine, Ser: 0.56 mg/dL — ABNORMAL LOW (ref 0.61–1.24)
GFR calc Af Amer: >60 mL/min (ref >60)
GFR calc non Af Amer: >60 mL/min (ref >60)
Glucose, Bld: 68 mg/dL — ABNORMAL LOW (ref 70–99)
Potassium: 3.8 mmol/L (ref 3.5–5.1)
Sodium: 137 mmol/L (ref 135–145)
Total Bilirubin: 0.6 mg/dL (ref 0.3–1.2)
Total Protein: 5.3 g/dL — ABNORMAL LOW (ref 6.5–8.1)

## 2018-12-06 LAB — URINALYSIS, ROUTINE W REFLEX MICROSCOPIC
Bilirubin Urine: NEGATIVE
Glucose, UA: NEGATIVE mg/dL
Hgb urine dipstick: NEGATIVE
Ketones, ur: NEGATIVE mg/dL
Leukocytes,Ua: NEGATIVE
Nitrite: NEGATIVE
Protein, ur: NEGATIVE mg/dL
Specific Gravity, Urine: 1.01 (ref 1.005–1.030)
pH: 6 (ref 5.0–8.0)

## 2018-12-06 LAB — HIV ANTIBODY (ROUTINE TESTING W REFLEX): HIV Screen 4th Generation wRfx: NONREACTIVE

## 2018-12-06 LAB — BRAIN NATRIURETIC PEPTIDE: B Natriuretic Peptide: 93.7 pg/mL (ref 0.0–100.0)

## 2018-12-06 LAB — SARS CORONAVIRUS 2 (TAT 6-24 HRS): SARS Coronavirus 2: NEGATIVE

## 2018-12-06 LAB — TROPONIN I (HIGH SENSITIVITY)
Troponin I (High Sensitivity): 9 ng/L (ref <18)
Troponin I (High Sensitivity): 5 ng/L (ref <18)

## 2018-12-06 LAB — PROTIME-INR
INR: 1 (ref 0.8–1.2)
Prothrombin Time: 13.4 seconds (ref 11.4–15.2)

## 2018-12-06 LAB — GLUCOSE, CAPILLARY: Glucose-Capillary: 186 mg/dL — ABNORMAL HIGH (ref 70–99)

## 2018-12-06 LAB — LACTIC ACID, PLASMA
Lactic Acid, Venous: 2 mmol/L (ref 0.5–1.9)
Lactic Acid, Venous: 2 mmol/L (ref 0.5–1.9)

## 2018-12-06 LAB — ECHOCARDIOGRAM COMPLETE
Height: 67 in
Weight: 1600 oz

## 2018-12-06 MED ORDER — ATORVASTATIN CALCIUM 40 MG PO TABS
40.0000 mg | ORAL_TABLET | Freq: Every day | ORAL | Status: DC
  Administered 2018-12-06 – 2018-12-09 (×4): 40 mg via ORAL
  Filled 2018-12-06 (×4): qty 1

## 2018-12-06 MED ORDER — ENOXAPARIN SODIUM 30 MG/0.3ML ~~LOC~~ SOLN
30.0000 mg | SUBCUTANEOUS | Status: DC
  Administered 2018-12-07 – 2018-12-08 (×2): 30 mg via SUBCUTANEOUS
  Filled 2018-12-06 (×3): qty 0.3

## 2018-12-06 MED ORDER — SODIUM CHLORIDE 0.9 % IV SOLN
2.0000 g | Freq: Two times a day (BID) | INTRAVENOUS | Status: DC
  Administered 2018-12-06 – 2018-12-07 (×3): 2 g via INTRAVENOUS
  Filled 2018-12-06 (×4): qty 2

## 2018-12-06 MED ORDER — SODIUM CHLORIDE 0.9 % IV SOLN
INTRAVENOUS | Status: AC
  Administered 2018-12-06 (×2): via INTRAVENOUS

## 2018-12-06 MED ORDER — PIPERACILLIN-TAZOBACTAM 3.375 G IVPB: 3.3750 g | Freq: Three times a day (TID) | INTRAVENOUS | Status: DC

## 2018-12-06 MED ORDER — PANTOPRAZOLE SODIUM 40 MG PO TBEC
40.0000 mg | DELAYED_RELEASE_TABLET | Freq: Every day | ORAL | Status: DC
  Administered 2018-12-06 – 2018-12-09 (×4): 40 mg via ORAL
  Filled 2018-12-06 (×4): qty 1

## 2018-12-06 MED ORDER — LEVOTHYROXINE SODIUM 50 MCG PO TABS
50.0000 ug | ORAL_TABLET | Freq: Every day | ORAL | Status: DC
  Administered 2018-12-06 – 2018-12-09 (×4): 50 ug via ORAL
  Filled 2018-12-06 (×4): qty 1

## 2018-12-06 MED ORDER — PERFLUTREN LIPID MICROSPHERE
1.0000 mL | INTRAVENOUS | Status: AC | PRN
  Administered 2018-12-06: 3 mL via INTRAVENOUS
  Filled 2018-12-06: qty 10

## 2018-12-06 MED ORDER — SENNOSIDES-DOCUSATE SODIUM 8.6-50 MG PO TABS: 1.0000 | ORAL_TABLET | Freq: Every evening | ORAL | Status: DC | PRN

## 2018-12-06 MED ORDER — OXYCODONE HCL 5 MG PO TABS
5.0000 mg | ORAL_TABLET | Freq: Four times a day (QID) | ORAL | Status: DC | PRN
  Administered 2018-12-06 – 2018-12-09 (×5): 5 mg via ORAL
  Filled 2018-12-06 (×5): qty 1

## 2018-12-06 MED ORDER — OXYCODONE-ACETAMINOPHEN 5-325 MG PO TABS
1.0000 | ORAL_TABLET | Freq: Four times a day (QID) | ORAL | Status: DC | PRN
  Administered 2018-12-06 – 2018-12-07 (×3): 1 via ORAL
  Administered 2018-12-08: 2 via ORAL
  Administered 2018-12-09: 1 via ORAL
  Filled 2018-12-06 (×3): qty 1
  Filled 2018-12-06: qty 2
  Filled 2018-12-06: qty 1

## 2018-12-06 MED ORDER — CHLORHEXIDINE GLUCONATE CLOTH 2 % EX PADS
6.0000 | MEDICATED_PAD | Freq: Every day | CUTANEOUS | Status: DC
  Administered 2018-12-06 – 2018-12-08 (×3): 6 via TOPICAL

## 2018-12-06 MED ORDER — ENSURE ENLIVE PO LIQD
237.0000 mL | Freq: Two times a day (BID) | ORAL | Status: DC
  Administered 2018-12-06 – 2018-12-09 (×4): 237 mL via ORAL

## 2018-12-06 MED ORDER — OXYCODONE-ACETAMINOPHEN 10-325 MG PO TABS: 1.0000 | ORAL_TABLET | Freq: Four times a day (QID) | ORAL | Status: DC | PRN

## 2018-12-06 MED ORDER — DIPHENOXYLATE-ATROPINE 2.5-0.025 MG PO TABS
1.0000 | ORAL_TABLET | Freq: Four times a day (QID) | ORAL | Status: DC | PRN
  Administered 2018-12-06: 1 via ORAL
  Filled 2018-12-06: qty 1

## 2018-12-06 MED ORDER — ACETAMINOPHEN 325 MG PO TABS
650.0000 mg | ORAL_TABLET | Freq: Four times a day (QID) | ORAL | Status: DC | PRN
  Filled 2018-12-06: qty 2

## 2018-12-06 MED ORDER — SODIUM CHLORIDE 0.9 % IV BOLUS
1000.0000 mL | Freq: Once | INTRAVENOUS | Status: AC
  Administered 2018-12-06: 1000 mL via INTRAVENOUS

## 2018-12-06 MED ORDER — SODIUM CHLORIDE 0.9% FLUSH: 3.0000 mL | Freq: Once | INTRAVENOUS | Status: DC

## 2018-12-06 MED ORDER — ACETAMINOPHEN 650 MG RE SUPP: 650.0000 mg | Freq: Four times a day (QID) | RECTAL | Status: DC | PRN

## 2018-12-06 MED ORDER — VANCOMYCIN HCL IN DEXTROSE 1-5 GM/200ML-% IV SOLN
1000.0000 mg | INTRAVENOUS | Status: DC
  Administered 2018-12-06 – 2018-12-07 (×2): 1000 mg via INTRAVENOUS
  Filled 2018-12-06 (×2): qty 200

## 2018-12-06 MED ORDER — PIPERACILLIN-TAZOBACTAM 3.375 G IVPB 30 MIN
3.3750 g | Freq: Once | INTRAVENOUS | Status: DC
  Filled 2018-12-06: qty 50

## 2018-12-06 NOTE — ED Notes (Signed)
Pt ate breakfast. Echo completed at bedside.

## 2018-12-06 NOTE — ED Notes (Signed)
Report given.

## 2018-12-06 NOTE — ED Notes (Signed)
Patient transported to X-ray 

## 2018-12-06 NOTE — ED Notes (Signed)
Pt ambulated to restroom without difficulty

## 2018-12-06 NOTE — Consult Note (Signed)
Cardiology Consultation:   Patient ID: Brett [name]; [id number]; 09/22/1960   Admit date: 12/06/2018 Date of Consult: 12/06/2018  Primary Care Provider: Sid Falcon, MD Primary Cardiologist: No primary care provider on file. Primary Electrophysiologist:  None  Chief Complaint: lower extremity swelling  Patient Profile:   Brett [name] is a 58 y.o. 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  History of Present Illness:   Patient presents for evaluation of lower extremity edema which has been present for approximately 3 days.  He has no other symptoms and denies dyspnea, weight gain, orthopnea, or chest pain.  In the ED was found to be hypotensive with a labile blood pressure, at times as low as 70s/40s but then improving to 90/60.  He is being admitted to internal medicine for hypotension.  Cardiology is consulted to rule out cardiogenic shock.  He denies any cardiac history.  As above he denies any dyspnea, and states he has been losing weight and not gaining weight due to ongoing chemotherapy.  He appears comfortable on room air, with flat neck veins and warm extremities.  An echo was done in November 2017 with normal LVEF.   Labs notable for WBC 17.7, creatinine 0.56.  BNP and troponin are pending.  ECG sinus rhythm with right bundle branch block and right axis deviation, no ST segment changes.  CXR: Clear lung fields  Past Medical History:  Diagnosis Date  . Barrett's esophagus 07/01/2013   Without dysplasia on biopsy 09/03/2012. Repeat EGD recommended 08/2015  . Bilateral cataracts 02/13/2017  . Carotid artery stenosis 07/01/2013   Requiring right sided stent   . Chronic pain syndrome 07/01/2013  . Closed head injury with brief loss of consciousness ([place]) 07/22/2010   Head trapped in a hydraulic device at work.  Fracture of orbital bones on right and brief loss of consciousness per report.  .  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 There was likely a strong psycho-social overlay in regard to the cognitive deficits in the form of mood disorder with psychotic features and mixed anxiety symptomatology. His primary tested cognitive deficits are in the areas of attention, executi  . Daily headache "since 07/2010"   constantly  . Degenerative joint disease of cervical spine 07/01/2013  . Dupuytren's contracture of both hands 04/08/2014  . Encounter for antineoplastic chemotherapy 10/05/2015  . Encounter for antineoplastic immunotherapy 05/24/2016  . Erectile dysfunction associated with type 2 diabetes mellitus ([place]) 07/01/2013  . Fibromyalgia 07/01/2013  . Goals of care, counseling/discussion 03/28/2016  . History of blood transfusion 11/28/2015   "suppose to get his first today" (11/28/2015)  . Hyperlipidemia LDL goal < 100 07/01/2013  . Intractable hiccups 04/11/2016  . 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 Memory changes    "memory issues" from head injury  . Moderate protein-calorie malnutrition ([place]) 11/29/2015  . Osteoarthritis of right thumb 10/21/2014  . Peripheral vascular occlusive disease ([place]) 07/01/2013   Requiring 2 arterial stents above the left knee per report  . Pneumonia ~ 2006/2007  . Post traumatic stress disorder 07/01/2013  . Primary lung adenocarcinoma ([place]) dx'd 08/2015   "right lung"  . Severe major depression with psychotic features ([place]) 04/15/2011  . Tobacco abuse 07/01/2013  . Type 2 diabetes mellitus with vascular disease ([place]) 07/01/2013   Left lower extremity  and right carotid stenting    Past Surgical History:  Procedure Laterality Date  . CAROTID STENT Right ?2014  . COLONOSCOPY N/A 11/30/2015   Procedure: COLONOSCOPY;  Surgeon: Teena Irani, MD;  Location: [facility] ENDOSCOPY;  Service: Endoscopy;  Laterality: N/A;  .  ESOPHAGOGASTRODUODENOSCOPY N/A 11/30/2015   Procedure: ESOPHAGOGASTRODUODENOSCOPY (EGD);  Surgeon: Teena Irani, MD;  Location[facility] ENDOSCOPY;  Service: Endoscopy;  Laterality: N/A;  . ESOPHAGOGASTRODUODENOSCOPY (EGD) WITH PROPOFOL N/A 01/05/2016   Procedure: ESOPHAGOGASTRODUODENOSCOPY (EGD) WITH PROPOFOL;  Surgeon: Teena Irani, MD;  Location: [facility] ENDOSCOPY;  Service: Endoscopy;  Laterality: N/A;  . FEMORAL ARTERY STENT Left 05/2012; ~ 2015   Archie Endo 06/04/2012; Raechel Chute report  . FRACTURE SURGERY    . GIVENS CAPSULE STUDY N/A 12/22/2015   Procedure: GIVENS CAPSULE STUDY;  Surgeon: Wonda Horner, MD;  Location: [facility] ENDOSCOPY;  Service: Endoscopy;  Laterality: N/A;  . HARDWARE REMOVAL Right 11/15/2011   Removal of deep frontozygomatic orbital hardware/notes 11/15/2011  . HERNIA REPAIR  [id number]   Umbilical  . LAPAROSCOPY N/A 02/27/2016   Procedure: LAPAROSCOPY, LAPAROTOMY  WITH TWO SMALL BOWEL RESECTION;  Surgeon: Johnathan Hausen, MD;  Location: [facility] ORS;  Service: General;  Laterality: N/A;  . ORIF ORBITAL FRACTURE Right 08/15/2010    caught in a hydraulic machine; open reduction internal fixation of orbital rim fracture and open reduction of zygomatic arch fracture  Archie Endo 10/13/2009  . PORTACATH PLACEMENT Left 04/04/2016   Procedure: INSERTION PORT-A-CATH LEFT CHEST;  Surgeon: Melrose Nakayama, MD;  Location: [place];  Service: Thoracic;  Laterality: Left;  Marland Kitchen VIDEO ASSISTED THORACOSCOPY (VATS)/ LOBECTOMY Right 10/18/2015   Procedure: VIDEO ASSISTED THORACOSCOPY (VATS)/ LOBECTOMY;  Surgeon: Melrose Nakayama, MD;  Location: [place];  Service: Thoracic;  Laterality: Right;  Marland Kitchen VIDEO BRONCHOSCOPY Bilateral 09/21/2015   Procedure: VIDEO BRONCHOSCOPY WITH FLUORO;  Surgeon: Juanito Doom, MD;  Location: [facility] ENDOSCOPY;  Service: Cardiopulmonary;  Laterality: Bilateral;     Inpatient Medications: Scheduled Meds: . atorvastatin  40 mg Oral Daily  . enoxaparin (LOVENOX) injection  30 mg Subcutaneous Q24H  .  levothyroxine  50 mcg Oral Q0600  . pantoprazole  40 mg Oral Daily  . sodium chloride flush  3 mL Intravenous Once   Continuous Infusions:  PRN Meds: acetaminophen **OR** acetaminophen, diphenoxylate-atropine, senna-docusate  Home Meds: Prior to Admission medications   Medication Sig Start Date End Date Taking? Authorizing Provider  aspirin EC 81 MG tablet Take 81 mg by mouth daily.    Yes [provider]  atorvastatin (LIPITOR) 40 MG tablet Take 1 tablet (40 mg total) by mouth daily. 01/09/18 01/26/19 Yes [name], MD  dexamethasone (DECADRON) 4 MG tablet 4 mg p.o. twice daily the day before, day of and day after chemotherapy every 3 weeks. Patient taking differently: Take 4 mg by mouth See admin instructions. Take 1 tablet. twice daily the day before, day of and day after chemotherapy every 3 weeks. 11/12/18  Yes Curt Bears, MD  diphenoxylate-atropine (LOMOTIL) 2.5-0.025 MG tablet Take 1 tablet by mouth 4 (four) times daily as needed for diarrhea or loose stools. 10/23/18  Yes Sid Falcon, MD  glipiZIDE (GLUCOTROL) 10 MG tablet Take 1 tablet (10 mg total) by mouth 2 (two) times daily before a meal. 12/08/17  Yes [name], MD  Insulin Glargine (LANTUS) 100 UNIT/ML Solostar Pen Inject 5 Units into the skin daily. 10/23/18  Yes Sid Falcon, MD  levothyroxine (SYNTHROID) 50 MCG tablet Take 1 tablet (50 mcg total) by mouth  daily before breakfast. 07/30/18  Yes Heilingoetter, Cassandra L, PA-C  lisinopril (ZESTRIL) 5 MG tablet Take 0.5 tablets (2.5 mg total) by mouth daily. 07/08/18  Yes Sid Falcon, MD  oxyCODONE-acetaminophen (PERCOCET) 10-325 MG tablet Take 1 tablet by mouth every 6 (six) hours as needed for pain. 11/13/18 11/13/19 Yes Sid Falcon, MD  pantoprazole (PROTONIX) 40 MG tablet TAKE 1 TABLET(40 MG) BY MOUTH DAILY Patient taking differently: Take 40 mg by mouth daily.  11/27/18  Yes Sid Falcon, MD  pioglitazone (ACTOS) 15 MG tablet Take 1  tablet (15 mg total) by mouth daily. 10/23/18  Yes Sid Falcon, MD  prochlorperazine (COMPAZINE) 10 MG tablet Take 10 mg by mouth every 6 (six) hours as needed for nausea or vomiting.   Yes [provider]  sitaGLIPtin (JANUVIA) 100 MG tablet Take 1 tablet (100 mg total) by mouth daily. 01/09/18  Yes [name], MD  ACCU-CHEK SOFTCLIX LANCETS lancets Use to test blood glucose 1-2 times daily. Dx Code E11.59 10/03/17   [name], MD  Blood Glucose Monitoring Suppl (ACCU-CHEK AVIVA PLUS) w/Device KIT Use to test blood glucose 1-2 times daily. Dx Code E11.59 04/06/18   [name], MD  glucose blood test strip USE TO TEST BLOOD GLUCOSE 1 TO 2 TIMES DAILY 03/30/18   [name], MD  Insulin Pen Needle (PEN NEEDLES) 32G X 4 MM MISC 1 pen by Does not apply route 2 (two) times daily. Dx Code E11.59 12/08/17   [name], MD  oxyCODONE-acetaminophen (PERCOCET) 10-325 MG tablet Take 1-2 tablets by mouth every 6 (six) hours as needed for pain. Patient not taking: Reported on 12/06/2018 10/26/18   Sid Falcon, MD  predniSONE (DELTASONE) 50 MG tablet TAKE 1 TABLET BY MOUTH AT 13 HOURS, 7 HOURS, AND 1 HOUR PRIOR TO SCAN Patient not taking: Reported on 12/06/2018 11/05/18   Curt Bears, MD  Probiotic Product (CULTURELLE IMMUNE DEFENSE) CAPS Take 1 capsule by mouth daily. Patient not taking: Reported on 12/06/2018 06/05/18   Sid Falcon, MD  predniSONE (DELTASONE) 50 MG tablet TAKE 1 TABLET BY MOUTH AT 13 HOURS, 7 HOURS, AND 1 HOUR PRIOR TO SCAN 05/28/18   Heilingoetter, Cassandra L, PA-C    Allergies:    Allergies  Allergen Reactions  . Gabapentin Other (See Comments)    suicidal thoughts  . Lyrica [Pregabalin] Other (See Comments)    Ineffective in low doses, suicidal thoughts in high doses   . Jardiance [Empagliflozin]     Muscle aches and dysuria  . Celebrex [Celecoxib] Other (See Comments)    Nightmares  . Contrast Media [Iodinated Diagnostic Agents] Rash     Mild erythematous rash arms and abdomen, not pruritic    Social History:   Social History   Socioeconomic History  . Marital status: Married    Spouse name: Not on file  . Number of children: Not on file  . Years of education: Not on file  . Highest education level: Not on file  Occupational History  . Not on file  Social Needs  . Financial resource strain: Not on file  . Food insecurity    Worry: Not on file    Inability: Not on file  . Transportation needs    Medical: Not on file    Non-medical: Not on file  Tobacco Use  . Smoking status: Current Every Day Smoker    Packs/day: 1.00    Years: 35.00    Pack years: 35.00  Types: Cigarettes    Last attempt to quit: 10/18/2015    Years since quitting: 3.1  . Smokeless tobacco: Never Used  . Tobacco comment: Sometimes less.  Substance and Sexual Activity  . Alcohol use: Yes    Alcohol/week: 0.0 standard drinks    Comment: Rarely.  . Drug use: No  . Sexual activity: Not Currently    Birth control/protection: None    Comment: Wife had tubes tied  Lifestyle  . Physical activity    Days per week: Not on file    Minutes per session: Not on file  . Stress: Not on file  Relationships  . Social Herbalist on phone: Not on file    Gets together: Not on file    Attends religious service: Not on file    Active member of club or organization: Not on file    Attends meetings of clubs or organizations: Not on file    Relationship status: Not on file  . Intimate partner violence    Fear of current or ex partner: Not on file    Emotionally abused: Not on file    Physically abused: Not on file    Forced sexual activity: Not on file  Other Topics Concern  . 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  Currently unemployed.  Finished 9th grade.  Lives with wife, 2 daughters, and  mother-in-law in [place].     Family History:    Family History  Problem Relation Age of Onset  . Heart disease Mother   . Diabetes Mother   . Diabetes Father   . Heart attack Father   . Diabetes Sister   . Coronary artery disease Sister        s/p CABG  . Diabetes Brother   . Healthy Daughter   . Healthy Son   . Diabetes Sister   . Healthy Sister   . Healthy Sister   . Diabetes Brother   . Coronary artery disease Brother        s/p CABG  . Diabetes Brother   . Healthy Brother   . Healthy Brother   . Healthy Daughter       ROS:  Please see the history of present illness.   All other ROS reviewed and negative.     Physical Exam/Data:   Vitals:   12/06/18 0530 12/06/18 0545 12/06/18 0600 12/06/18 0615  BP: (!) 76/64 97/72 108/82 97/70  Pulse: 81 (!) 53 75 75  Resp: _0 Temp:      TempSrc:      SpO2: 99% (!) 88% 100% 100%  Weight:      Height:       No intake or output data in the 24 hours ending 12/06/18 0621 Last 3 Weights 12/06/2018 11/12/2018 10/23/2018  Weight (lbs) 100 lb 101 lb 8 oz 105 lb 3.2 oz  Weight (kg) 45.36 kg 46.04 kg 47.718 kg     Body mass index is 15.66 kg/m.  General: Well developed, well nourished, in no acute distress. Head: Normocephalic, atraumatic, sclera non-icteric, no xanthomas, nares are without discharge.  Neck: Negative for carotid bruits. JVD not elevated. Lungs: Clear bilaterally to auscultation without wheezes, rales, or rhonchi. Breathing is unlabored. Heart: RRR with S1 S2. No murmurs, rubs, or gallops appreciated. Abdomen: Soft, non-tender, non-distended  with normoactive bowel sounds. No hepatomegaly. No rebound/guarding. No obvious abdominal masses. Msk:  Strength and tone appear normal for age. Extremities: No clubbing or cyanosis. No edema.  Distal pedal pulses are 2+ and equal bilaterally. Neuro: Alert and oriented X 3. No facial asymmetry. No focal deficit. Moves all extremities spontaneously. Psych:   Responds to questions appropriately with a normal affect.   EKG:  The EKG was personally reviewed and demonstrates:  RBBB, sinus rhythm Telemetry:  Telemetry was personally reviewed and demonstrates:  sinus  Relevant CV Studies: Echo 11/2015 Left ventricle: The cavity size was normal. Wall thickness was   normal. Systolic function was normal. The estimated ejection   fraction was in the range of 55% to 60%. Wall motion was normal;   there were no regional wall motion abnormalities. Left   ventricular diastolic function parameters were normal for the   patient&'s age.  Laboratory Data:  High Sensitivity Troponin:  No results for input(s): TROPONINIHS in the last 720 hours.   Cardiac EnzymesNo results for input(s): TROPONINI in the last 168 hours. No results for input(s): TROPIPOC in the last 168 hours.  Chemistry Recent Labs  Lab 12/06/18 0128  NA 137  K 3.8  CL 100  CO2 28  GLUCOSE 68*  BUN 7  CREATININE 0.56*  CALCIUM 8.3*  GFRNONAA >60  GFRAA >60  ANIONGAP 9    Recent Labs  Lab 12/06/18 0128  PROT 5.3*  ALBUMIN 2.8*  AST 24  ALT 20  ALKPHOS 162*  BILITOT 0.6   Hematology Recent Labs  Lab 12/06/18 0128  WBC 17.7*  RBC 4.18*  HGB 13.1  HCT 39.4  MCV 94.3  MCH 31.3  MCHC 33.2  RDW 13.3  PLT 201   BNPNo results for input(s): BNP, PROBNP in the last 168 hours.  DDimer No results for input(s): DDIMER in the last 168 hours.   Radiology/Studies:  Dg Chest Port 1 View  Result Date: 12/06/2018 CLINICAL DATA:  History of lung carcinoma with peripheral edema EXAM: PORTABLE CHEST 1 VIEW COMPARISON:  11/10/2018 FINDINGS: Cardiac shadow is within normal limits. Aortic calcifications are again noted. Left chest wall port is seen in satisfactory position. Postsurgical changes are noted on the right consistent with the patient's given clinical history. No focal infiltrate or sizable effusion is seen. No bony abnormality is noted. IMPRESSION: No acute abnormality  noted. Postoperative changes are seen. Electronically Signed   By: Inez Catalina M.D.   On: 12/06/2018 02:44    Assessment and Plan:   1. Lower extremity edema 2. Hypotension The cause of his lower extremity edema and hypotension is unclear at this point.  He does not appear to be in heart failure based on exam.  His neck veins are flat, as are clear and extremities are warm.  Agree with getting echocardiogram to rule this out completely (currently pending).  Would investigate for other potential causes of hypotension, including sepsis.  If hypotension worsens can consider IV fluid bolus.      For questions or updates, please contact [place] Please consult [email or web address] for contact info under     Signed, BARNETT, ADAM S, MD  12/06/2018 6:21 AM

## 2018-12-06 NOTE — H&P (Signed)
Date: 12/06/2018               Patient Name:  Brett [name] MRN: [id number]  DOB: 08-Sep-1960 Age / Sex: 58 y.o., male   PCP: Sid Falcon, MD         Medical Service: Internal Medicine Teaching Service         Attending Physician: Dr. Velna Ochs, MD    First Contact: Dr. Marva Panda Pager: [phone]  Second Contact: Dr. Eileen Stanford Pager: [phone]       After Hours (After 5p/  First Contact Pager: [phone]  weekends / holidays): Second Contact Pager: [phone]   Chief Complaint: Leg Swelling  History of Present Illness:  Brett [name] is a 58 y/o male, with a PMH of PAD, jejunal adenocarcinoma, metastatic lung cancer, chronic pain syndrome, and HLD, who presents to the [facility] for leg swelling. History obtained from patient and his spouse. Patient states that he first began to notice his leg swelling since last Wednesday. He states that his edema was only present up to the ankles, but has been gradually increasing up to his knees bilaterally. Patient denies SHOB, orthopnea, chest pain, urinary changes, constipation, fevers, nausea, vomiting. Patient does endorse diarrhea, which is a chronic problem for him.   During his ED course it was found that the patient had asymptomatic hypotension with a BP of 66/58 with a pulse range of 66-89, lactic acid of 2.0, leukocytosis of 17.7, and an EKG with new RBBB with an accompanying fascicular block . Patient states that his blood pressures at home run in the 120s.   Meds:  Current Meds  Medication Sig  . aspirin EC 81 MG tablet Take 81 mg by mouth daily.   Marland Kitchen atorvastatin (LIPITOR) 40 MG tablet Take 1 tablet (40 mg total) by mouth daily.  Marland Kitchen dexamethasone (DECADRON) 4 MG tablet 4 mg p.o. twice daily the day before, day of and day after chemotherapy every 3 weeks. (Patient taking differently: Take 4 mg by mouth See admin instructions. Take 1 tablet. twice daily the day before, day of and day after chemotherapy every 3 weeks.)  .  diphenoxylate-atropine (LOMOTIL) 2.5-0.025 MG tablet Take 1 tablet by mouth 4 (four) times daily as needed for diarrhea or loose stools.  Marland Kitchen glipiZIDE (GLUCOTROL) 10 MG tablet Take 1 tablet (10 mg total) by mouth 2 (two) times daily before a meal.  . Insulin Glargine (LANTUS) 100 UNIT/ML Solostar Pen Inject 5 Units into the skin daily.  Marland Kitchen levothyroxine (SYNTHROID) 50 MCG tablet Take 1 tablet (50 mcg total) by mouth daily before breakfast.  . lisinopril (ZESTRIL) 5 MG tablet Take 0.5 tablets (2.5 mg total) by mouth daily.  Marland Kitchen oxyCODONE-acetaminophen (PERCOCET) 10-325 MG tablet Take 1 tablet by mouth every 6 (six) hours as needed for pain.  . pantoprazole (PROTONIX) 40 MG tablet TAKE 1 TABLET(40 MG) BY MOUTH DAILY (Patient taking differently: Take 40 mg by mouth daily. )  . pioglitazone (ACTOS) 15 MG tablet Take 1 tablet (15 mg total) by mouth daily.  . prochlorperazine (COMPAZINE) 10 MG tablet Take 10 mg by mouth every 6 (six) hours as needed for nausea or vomiting.  . sitaGLIPtin (JANUVIA) 100 MG tablet Take 1 tablet (100 mg total) by mouth daily.     Allergies: Allergies as of 12/06/2018 - Review Complete 12/06/2018  Allergen Reaction Noted  . Gabapentin Other (See Comments) 05/26/2013  . Lyrica [pregabalin] Other (See Comments) 04/02/2016  . Jardiance [empagliflozin]  11/28/2017  .  Celebrex [celecoxib] Other (See Comments) 07/01/2013  . Contrast media [iodinated diagnostic agents] Rash 05/17/2016   Past Medical History:  Diagnosis Date  . Barrett's esophagus 07/01/2013   Without dysplasia on biopsy 09/03/2012. Repeat EGD recommended 08/2015  . Bilateral cataracts 02/13/2017  . Carotid artery stenosis 07/01/2013   Requiring right sided stent   . Chronic pain syndrome 07/01/2013  . Closed head injury with brief loss of consciousness ([place]) 07/22/2010   Head trapped in a hydraulic device at work.  Fracture of orbital bones on right and brief loss of consciousness per report.  . Cognitive  disorder 04/15/2011   Neuropsychological evaluation (03/2010):  Identified a number of problem areas including cognitive and psychiatric symptoms following a TBI in July 2012. There was likely a strong psycho-social overlay in regard to the cognitive deficits in the form of mood disorder with psychotic features and mixed anxiety symptomatology. His primary tested cognitive deficits are in the areas of attention, executi  . Daily headache "since 07/2010"   constantly  . Degenerative joint disease of cervical spine 07/01/2013  . Dupuytren's contracture of both hands 04/08/2014  . Encounter for antineoplastic chemotherapy 10/05/2015  . Encounter for antineoplastic immunotherapy 05/24/2016  . Erectile dysfunction associated with type 2 diabetes mellitus ([place]) 07/01/2013  . Fibromyalgia 07/01/2013  . Goals of care, counseling/discussion 03/28/2016  . History of blood transfusion 11/28/2015   "suppose to get his first today" (11/28/2015)  . Hyperlipidemia LDL goal < 100 07/01/2013  . Intractable hiccups 04/11/2016  . Jejunal adenocarcinoma ([place]) 02/13/2016  . Memory changes    "memory issues" from head injury  . Moderate protein-calorie malnutrition ([place]) 11/29/2015  . Osteoarthritis of right thumb 10/21/2014  . Peripheral vascular occlusive disease ([place]) 07/01/2013   Requiring 2 arterial stents above the left knee per report  . Pneumonia ~ 2006/2007  . Post traumatic stress disorder 07/01/2013  . Primary lung adenocarcinoma ([place]) dx'd 08/2015   "right lung"  . Severe major depression with psychotic features ([place]) 04/15/2011  . Tobacco abuse 07/01/2013  . Type 2 diabetes mellitus with vascular disease ([place]) 07/01/2013   Left lower extremity and right carotid stenting    Family History:  - DM: Mother and Father - HTN: N/A - Cancer: N/A with exception of patient.  Social History:  - Patient states that he has been using tobacco products for 40 years, with intermittent cessation. He currently smoke roughly 5  packs of cigarettes a week.  - Patient states that he has used marijuana 2x in his life. His last usage was after his diagnosis of metastatic lung cancer for pain reduction. It did not alleviate pain symptoms. - Patient denies EtOH use.  Review of Systems: A complete ROS was negative except as per HPI.  Physical Exam: Blood pressure (!) 75/48, pulse 68, temperature 97.7 F (36.5 C), temperature source Oral, resp. rate 16, height 5\' 7"  (1.702 m), weight 45.4 kg, SpO2 96 %. Physical Exam Vitals signs and nursing note reviewed.  Constitutional:      General: He is not in acute distress.    Appearance: Normal appearance. He is not ill-appearing, toxic-appearing or diaphoretic.  HENT:     Head: Normocephalic and atraumatic.  Eyes:     General:        Right eye: No discharge.        Left eye: No discharge.  Cardiovascular:     Rate and Rhythm: Normal rate. Rhythm irregular.     Pulses: Normal pulses.  Heart sounds: Normal heart sounds. No murmur. No friction rub. No gallop.   Pulmonary:     Effort: Pulmonary effort is normal.     Breath sounds: Normal breath sounds. No wheezing, rhonchi or rales.     Comments: No decreased breath sounds, wheezes, crackles, rhonchi noted on auscultation of the lung fields.  Abdominal:     General: Abdomen is flat. Bowel sounds are normal. There is no distension.     Palpations: Abdomen is soft.     Tenderness: There is no abdominal tenderness. There is no guarding.  Musculoskeletal:     Right lower leg: Edema present.     Left lower leg: Edema present.     Comments: Patient's bilateral lower extremities exhibit 2+ pitting edema, and are cool to the touch.   Neurological:     General: No focal deficit present.     Mental Status: He is alert and oriented to person, place, and time.  Psychiatric:        Mood and Affect: Mood normal.        Behavior: Behavior normal.     EKG: personally reviewed my interpretation is new onset RBBB with posterior  fascicular block.    CXR: personally reviewed my interpretation is that there are no acute abnormalities, or signs of fluid overload.   Assessment & Plan by Problem: Active Problems:   Hypotension Assessment:  Brett [name] is a 58 y/o male, with a PMH of PAD, jejunal adenocarcinoma, metastatic lung cancer, chronic pain syndrome, and HLD, who presents to the ED with new onset asymptomatic hypotension with new EKG finding for RBBB and L posterior fascicular block.    Patient's presentation, with new EKG changes are concerning for an underlying cardiogenic etiology. Patient has 2+ pitting edema, but does not appear to be fluid overloaded on examination. He has no JVD, or pulmonary symptoms on physical examination. Patient denies orthopnea or other symptoms related possible CHF. Due to new onset RBBB on EKG, other diagnosis on the differential include acute MI, cardiomyopathy, and PE. EKG shows no ST elevations, and patient's troponins and BNP are currently pending. Patient's vitals are stable, he is denies dyspnea, but does have a PMH of metastatic lung cancer, which may warrant r/o of DVT and PE. Currently patient's underlying etiology is unclear. Patient does have leukocytosis of 17 with lactic acidosis of 2.0. Lactic acidosis is likely type A due to hypoperfusion, as patient's extremities are cool to the touch, which may be indicative of  sepsis or possible cardiogenic shock.   Plan:  Hypotension with RBBB:  - Ordered Echocardiogram - F/U BNP and troponins - Consulted cardiology to assist with Mr. Gales health care. We appreciate their recommendations.   - Follow up cardiology recommendations.  - Consider IV fluid bolus.   Chronic Diarrhea:  - Continue Lomotil 2.5-0.025 mg - ordered CMP  Hypothyroidism:  - Continue Synthroid of 50 mcg  DVT ppx: Lovenox Dispo: Admit patient to Observation with expected length of stay less than 2 midnights.  Signed: Maudie Mercury, MD 12/06/2018,  5:55 AM  Pager: [phone]

## 2018-12-06 NOTE — ED Provider Notes (Signed)
[facility] EMERGENCY DEPARTMENT Provider Note   CSN: [id number] Arrival date & time: 12/06/18  0032    History   Chief Complaint Chief Complaint  Patient presents with   Leg Swelling    HPI Brett [name] is a 58 y.o. male.   The history is provided by the patient.  He has history of diabetes, hyperlipidemia, metastatic lung cancer and comes in because of leg swelling.  He has noted swelling of both legs over the last 3 days, and it seems to be getting worse.  He denies chest pain, heaviness, tightness, pressure.  He denies dyspnea.  He denies dizziness or lightheadedness.  He was recently started on a new chemotherapy of docetaxel and ramucirumab.  He does have chronic pain in his left leg from neuropathy, but there has been no new pain.  He denies fever, chills, sweats.  Past Medical History:  Diagnosis Date   Barrett's esophagus 07/01/2013   Without dysplasia on biopsy 09/03/2012. Repeat EGD recommended 08/2015   Bilateral cataracts 02/13/2017   Carotid artery stenosis 07/01/2013   Requiring right sided stent    Chronic pain syndrome 07/01/2013   Closed head injury with brief loss of consciousness ([place]) 07/22/2010   Head trapped in a hydraulic device at work.  Fracture of orbital bones on right and brief loss of consciousness per report.   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 There was likely a strong psycho-social overlay in regard to the cognitive deficits in the form of mood disorder with psychotic features and mixed anxiety symptomatology. His primary tested cognitive deficits are in the areas of attention, executi   Daily headache "since 07/2010"   constantly   Degenerative joint disease of cervical spine 07/01/2013   Dupuytren's contracture of both hands 04/08/2014   Encounter for antineoplastic  chemotherapy 10/05/2015   Encounter for antineoplastic immunotherapy 05/24/2016   Erectile dysfunction associated with type 2 diabetes mellitus ([place]) 07/01/2013   Fibromyalgia 07/01/2013   Goals of care, counseling/discussion 03/28/2016   History of blood transfusion 11/28/2015   "suppose to get his first today" (11/28/2015)   Hyperlipidemia LDL goal < 100 07/01/2013   Intractable hiccups 04/11/2016   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   Memory changes    "memory issues" from head injury   Moderate protein-calorie malnutrition ([place]) 11/29/2015   Osteoarthritis of right thumb 10/21/2014   Peripheral vascular occlusive disease ([place]) 07/01/2013   Requiring 2 arterial stents above the left knee per report   Pneumonia ~ 2006/2007   Post traumatic stress disorder 07/01/2013   Primary lung adenocarcinoma ([place]) dx'd 08/2015   "right lung"   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   Tobacco abuse 07/01/2013   Type 2 diabetes mellitus with vascular disease ([place]) 07/01/2013   Left lower extremity and right carotid stenting    Patient Active Problem List   Diagnosis Date Noted   Port-A-Cath in place 05/28/2018   Diarrhea 05/28/2018   Neuropathy 05/16/2017   Bilateral cataracts 02/13/2017   Constipation due to opioid therapy 07/05/2016   Encounter for antineoplastic immunotherapy 05/24/2016   Goals of care, counseling/discussion 03/28/2016   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   Moderate protein-calorie malnutrition ([place]) 11/29/2015   Primary adenocarcinoma of right lung ([place]) 08/25/2015   Osteoarthritis of right thumb 10/21/2014   Dupuytren's contracture of both hands 04/08/2014   Healthcare maintenance 10/08/2013  Post traumatic stress disorder 07/02/2013   Type 2 diabetes mellitus with vascular disease ([place]) 07/01/2013   Peripheral vascular occlusive disease ([place]) 07/01/2013   Stenosis of right carotid artery without cerebral  infarction 07/01/2013   Erectile dysfunction associated with type 2 diabetes mellitus ([place]) 07/01/2013   Hyperlipidemia 07/01/2013   Tobacco abuse 07/01/2013   Fibromyalgia 07/01/2013   Chronic pain syndrome 07/01/2013   Cognitive disorder 04/15/2011   Closed head injury with brief loss of consciousness ([place]) 08/10/2010    Past Surgical History:  Procedure Laterality Date   CAROTID STENT Right ?2014   COLONOSCOPY N/A 11/30/2015   Procedure: COLONOSCOPY;  Surgeon: Teena Irani, MD;  Location: [facility] ENDOSCOPY;  Service: Endoscopy;  Laterality: N/A;   ESOPHAGOGASTRODUODENOSCOPY N/A 11/30/2015   Procedure: ESOPHAGOGASTRODUODENOSCOPY (EGD);  Surgeon: Teena Irani, MD;  Location: [facility] ENDOSCOPY;  Service: Endoscopy;  Laterality: N/A;   ESOPHAGOGASTRODUODENOSCOPY (EGD) WITH PROPOFOL N/A 01/05/2016   Procedure: ESOPHAGOGASTRODUODENOSCOPY (EGD) WITH PROPOFOL;  Surgeon: Teena Irani, MD;  Location: [facility] ENDOSCOPY;  Service: Endoscopy;  Laterality: N/A;   FEMORAL ARTERY STENT Left 05/2012; ~ 2015   Archie Endo 06/04/2012; Raechel Chute report   FRACTURE SURGERY     GIVENS CAPSULE STUDY N/A 12/22/2015   Procedure: GIVENS CAPSULE STUDY;  Surgeon: Wonda Horner, MD;  Location: [facility] ENDOSCOPY;  Service: Endoscopy;  Laterality: N/A;   HARDWARE REMOVAL Right 11/15/2011   Removal of deep frontozygomatic orbital hardware/notes 11/15/2011   HERNIA REPAIR  5170   Umbilical   LAPAROSCOPY N/A 02/27/2016   Procedure: LAPAROSCOPY, LAPAROTOMY  WITH TWO SMALL BOWEL RESECTION;  Surgeon: Johnathan Hausen, MD;  Location: [facility] ORS;  Service: General;  Laterality: N/A;   ORIF ORBITAL FRACTURE Right 08/15/2010    caught in a hydraulic machine; open reduction internal fixation of orbital rim fracture and open reduction of zygomatic arch fracture  /notes 10/13/2009   PORTACATH PLACEMENT Left 04/04/2016   Procedure: INSERTION PORT-A-CATH LEFT CHEST;  Surgeon: Melrose Nakayama, MD;  Location: [facility];  Service: Thoracic;  Laterality:  Left;   [place] (VATS)/ LOBECTOMY Right 10/18/2015   Procedure: VIDEO ASSISTED THORACOSCOPY (VATS)/ LOBECTOMY;  Surgeon: Melrose Nakayama, MD;  Location: [facility];  Service: Thoracic;  Laterality: Right;   VIDEO BRONCHOSCOPY Bilateral 09/21/2015   Procedure: VIDEO BRONCHOSCOPY WITH FLUORO;  Surgeon: Juanito Doom, MD;  Location: [facility] ENDOSCOPY;  Service: Cardiopulmonary;  Laterality: Bilateral;        Home Medications    Prior to Admission medications   Medication Sig Start Date End Date Taking? Authorizing Provider  ACCU-CHEK SOFTCLIX LANCETS lancets Use to test blood glucose 1-2 times daily. Dx Code E11.59 10/03/17   [name], MD  aspirin EC 81 MG tablet Take 81 mg by mouth daily.     [provider]  atorvastatin (LIPITOR) 40 MG tablet Take 1 tablet (40 mg total) by mouth daily. 01/09/18 01/26/19  [name], MD  Blood Glucose Monitoring Suppl (ACCU-CHEK AVIVA PLUS) w/Device KIT Use to test blood glucose 1-2 times daily. Dx Code E11.59 04/06/18   [name], MD  dexamethasone (DECADRON) 4 MG tablet 4 mg p.o. twice daily the day before, day of and day after chemotherapy every 3 weeks. 11/12/18   Curt Bears, MD  diphenoxylate-atropine (LOMOTIL) 2.5-0.025 MG tablet Take 1 tablet by mouth 4 (four) times daily as needed for diarrhea or loose stools. 10/23/18   Sid Falcon, MD  glipiZIDE (GLUCOTROL) 10 MG tablet Take 1 tablet (10 mg total) by mouth 2 (two) times  daily before a meal. 12/08/17   [name], MD  glucose blood test strip USE TO TEST BLOOD GLUCOSE 1 TO 2 TIMES DAILY 03/30/18   [name], MD  Insulin Glargine (LANTUS) 100 UNIT/ML Solostar Pen Inject 5 Units into the skin daily. 10/23/18   Sid Falcon, MD  Insulin Pen Needle (PEN NEEDLES) 32G X 4 MM MISC 1 pen by Does not apply route 2 (two) times daily. Dx Code E11.59 12/08/17   [name], MD  levothyroxine (SYNTHROID) 50 MCG tablet Take 1 tablet (50 mcg total) by  mouth daily before breakfast. 07/30/18   Heilingoetter, Cassandra L, PA-C  lisinopril (ZESTRIL) 5 MG tablet Take 0.5 tablets (2.5 mg total) by mouth daily. 07/08/18   Sid Falcon, MD  loperamide (IMODIUM A-D) 2 MG tablet Take 2 mg by mouth 4 (four) times daily as needed for diarrhea or loose stools.    [provider]  oxyCODONE-acetaminophen (PERCOCET) 10-325 MG tablet Take 1-2 tablets by mouth every 6 (six) hours as needed for pain. 10/26/18   Sid Falcon, MD  oxyCODONE-acetaminophen (PERCOCET) 10-325 MG tablet Take 1 tablet by mouth every 6 (six) hours as needed for pain. 11/13/18 11/13/19  Sid Falcon, MD  pantoprazole (PROTONIX) 40 MG tablet TAKE 1 TABLET(40 MG) BY MOUTH DAILY 11/27/18   Sid Falcon, MD  pioglitazone (ACTOS) 15 MG tablet Take 1 tablet (15 mg total) by mouth daily. 10/23/18   Sid Falcon, MD  predniSONE (DELTASONE) 50 MG tablet TAKE 1 TABLET BY MOUTH AT 13 HOURS, 7 HOURS, AND 1 HOUR PRIOR TO SCAN 11/05/18   Curt Bears, MD  Probiotic Product (CULTURELLE IMMUNE DEFENSE) CAPS Take 1 capsule by mouth daily. 06/05/18   Sid Falcon, MD  prochlorperazine (COMPAZINE) 10 MG tablet Take 10 mg by mouth every 6 (six) hours as needed for nausea or vomiting.    [provider]  sitaGLIPtin (JANUVIA) 100 MG tablet Take 1 tablet (100 mg total) by mouth daily. 01/09/18   [name], MD  predniSONE (DELTASONE) 50 MG tablet TAKE 1 TABLET BY MOUTH AT 13 HOURS, 7 HOURS, AND 1 HOUR PRIOR TO SCAN 05/28/18   Heilingoetter, Cassandra L, PA-C    Family History Family History  Problem Relation Age of Onset   Heart disease Mother    Diabetes Mother    Diabetes Father    Heart attack Father    Diabetes Sister    Coronary artery disease Sister        s/p CABG   Diabetes Brother    Healthy Daughter    Healthy Son    Diabetes Sister    Healthy Sister    Healthy Sister    Diabetes Brother    Coronary artery disease Brother        s/p CABG     Diabetes Brother    Healthy Brother    Healthy Brother    Healthy Daughter     Social History Social History   Tobacco Use   Smoking status: Current Every Day Smoker    Packs/day: 1.00    Years: 35.00    Pack years: 35.00    Types: Cigarettes    Last attempt to quit: 10/18/2015    Years since quitting: 3.1   Smokeless tobacco: Never Used   Tobacco comment: Sometimes less.  Substance Use Topics   Alcohol use: Yes    Alcohol/week: 0.0 standard drinks    Comment: Rarely.   Drug use: No  Allergies   Gabapentin, Lyrica [pregabalin], Jardiance [empagliflozin], Celebrex [celecoxib], and Contrast media [iodinated diagnostic agents]   Review of Systems Review of Systems  All other systems reviewed and are negative.    Physical Exam Updated Vital Signs BP 90/63 (BP Location: Right Arm)    Pulse 74    Temp 97.7 F (36.5 C) (Oral)    Resp 18    Ht '5\' 7"'  (1.702 m)    Wt 45.4 kg    SpO2 91%    BMI 15.66 kg/m   Physical Exam Vitals signs and nursing note reviewed.    58 year old male, resting comfortably and in no acute distress. Vital signs are significant for low blood pressure. Oxygen saturation is 91%, which is hypoxic. Head is normocephalic and atraumatic. PERRLA, EOMI. Oropharynx is clear. Neck is nontender and supple without adenopathy or JVD. Back is nontender and there is no CVA tenderness.  There is trace presacral edema. Lungs are clear without rales, wheezes, or rhonchi. Chest is nontender. Heart has regular rate and rhythm without murmur. Abdomen is soft, flat, nontender without masses or hepatosplenomegaly and peristalsis is normoactive. Extremities have 2+ edema, full range of motion is present. Skin is warm and dry without rash. Neurologic: Mental status is normal, cranial nerves are intact, there are no motor or sensory deficits.  ED Treatments / Results  Labs (all labs ordered are listed, but only abnormal results are displayed) Labs  Reviewed  COMPREHENSIVE METABOLIC PANEL - Abnormal; Notable for the following components:      Result Value   Glucose, Bld 68 (*)    Creatinine, Ser 0.56 (*)    Calcium 8.3 (*)    Total Protein 5.3 (*)    Albumin 2.8 (*)    Alkaline Phosphatase 162 (*)    All other components within normal limits  CBC - Abnormal; Notable for the following components:   WBC 17.7 (*)    RBC 4.18 (*)    All other components within normal limits  LACTIC ACID, PLASMA - Abnormal; Notable for the following components:   Lactic Acid, Venous 2.0 (*)    All other components within normal limits  LACTIC ACID, PLASMA - Abnormal; Notable for the following components:   Lactic Acid, Venous 2.0 (*)    All other components within normal limits  URINALYSIS, ROUTINE W REFLEX MICROSCOPIC  PROTIME-INR  BRAIN NATRIURETIC PEPTIDE  TROPONIN I (HIGH SENSITIVITY)    EKG EKG Interpretation  Date/Time:  Sunday December 06 2018 03:00:07 EST Ventricular Rate:  68 PR Interval:    QRS Duration: 122 QT Interval:  416 QTC Calculation: 443 R Axis:   92 Text Interpretation: Sinus rhythm Borderline short PR interval RBBB and LPFB When compared with ECG of 12/07/2015, Right bundle branch block is now present Left posterior fasicular block is now present Confirmed by Delora Fuel ([id number]) on 12/06/2018 3:11:36 AM   Radiology Dg Chest Port 1 View  Result Date: 12/06/2018 CLINICAL DATA:  History of lung carcinoma with peripheral edema EXAM: PORTABLE CHEST 1 VIEW COMPARISON:  11/10/2018 FINDINGS: Cardiac shadow is within normal limits. Aortic calcifications are again noted. Left chest wall port is seen in satisfactory position. Postsurgical changes are noted on the right consistent with the patient's given clinical history. No focal infiltrate or sizable effusion is seen. No bony abnormality is noted. IMPRESSION: No acute abnormality noted. Postoperative changes are seen. Electronically Signed   By: Inez Catalina M.D.   On:  12/06/2018 02:44    Procedures  Procedures   Medications Ordered in ED Medications  sodium chloride flush (NS) 0.9 % injection 3 mL (3 mLs Intravenous Not Given 12/06/18 0209)     Initial Impression / Assessment and Plan / ED Course  I have reviewed the triage vital signs and the nursing notes.  Pertinent labs & imaging results that were available during my care of the patient were reviewed by me and considered in my medical decision making (see chart for details).  Peripheral edema.  No findings to suggest DVT or cellulitis.  Will check screening labs, chest x-ray, urinalysis.  Old records are reviewed confirming recent initiation of chemotherapy with docetaxel and ramucirumab.  Blood pressure has continued to stay low, although patient appears to be tolerating it well.  Lactic acid is minimally elevated.  There is normal renal function.  Albumin is slightly low at 2.8, but this is not low enough to account for his edema.  Urine showed no proteinuria.  Chest x-ray shows no acute process.  I am reluctant to give him fluids given the fact that he is already fluid overloaded.  Case is discussed with Dr. Sherry Ruffing of internal medicine teaching service who agrees to admit the patient.  Final Clinical Impressions(s) / ED Diagnoses   Final diagnoses:  Idiopathic hypotension  Peripheral edema  Alkaline phosphatase elevation    ED Discharge Orders    None       Delora Fuel, MD 11/10/09 [phone]

## 2018-12-06 NOTE — Progress Notes (Signed)
  Date: 12/06/2018  Patient name: Brett [name]  Medical record number: [id number]  Date of birth: 01/04/1961        I have seen and evaluated this patient and I have discussed the plan of care with the house staff. Please see their note for complete details. I concur with their findings.  Please see my attested H&P from earlier today.  Velna Ochs, MD 12/06/2018, 2:04 PM

## 2018-12-06 NOTE — Progress Notes (Signed)
  Echocardiogram 2D Echocardiogram has been performed with Definity.  Brett [name] 12/06/2018, 9:15 AM

## 2018-12-06 NOTE — ED Notes (Signed)
Breakfast Ordered 

## 2018-12-06 NOTE — ED Notes (Signed)
PT has questions about blood cultures and antibiotics. Pt also requesting Oxycodone for pain. MD aware.

## 2018-12-06 NOTE — Progress Notes (Signed)
Pharmacy Antibiotic Note  Brett [name] is a 58 y.o. male admitted on 12/06/2018 with sepsis.  Pharmacy has been consulted for Vancocin and Zosyn dosing.  Plan: Vancomycin 1000mg  IV Q24H. Goal AUC 400-550.  Expected AUC 540.  SCr used 0.8.  Zosyn 3.375g IV Q8H (4-hour infusion).  Height: 5\' 7"  (170.2 cm) Weight: 100 lb (45.4 kg) IBW/kg (Calculated) : 66.1  Temp (24hrs), Avg:97.7 F (36.5 C), Min:97.7 F (36.5 C), Max:97.7 F (36.5 C)  Recent Labs  Lab 12/06/18 0128 12/06/18 0224 12/06/18 0411  WBC 17.7*  --   --   CREATININE 0.56*  --   --   LATICACIDVEN  --  2.0* 2.0*    Estimated Creatinine Clearance: 63.1 mL/min (A) (by C-G formula based on SCr of 0.56 mg/dL (L)).    Allergies  Allergen Reactions  . Gabapentin Other (See Comments)    suicidal thoughts  . Lyrica [Pregabalin] Other (See Comments)    Ineffective in low doses, suicidal thoughts in high doses   . Jardiance [Empagliflozin]     Muscle aches and dysuria  . Celebrex [Celecoxib] Other (See Comments)    Nightmares  . Contrast Media [Iodinated Diagnostic Agents] Rash    Mild erythematous rash arms and abdomen, not pruritic    Thank you for allowing pharmacy to be a part of this patient's care.  Wynona Neat, PharmD, BCPS  12/06/2018 7:26 AM

## 2018-12-06 NOTE — Progress Notes (Signed)
Pharmacy Antibiotic Note  Brett [name] is a 58 y.o. male admitted on 12/06/2018 with sepsis.  Pharmacy has been consulted for Vancocin and cefepime dosing. Pt is afebrile and WBC is elevated. Scr is WNL.  Plan: Vancomycin 1000mg  IV Q24H. Goal AUC 400-550.  Expected AUC 540.  SCr used 0.8.  Cefepime 2gm IV Q12H F/u renal fxn, C&S, clinical status and peak/trough at SS  Height: 5\' 7"  (170.2 cm) Weight: 100 lb (45.4 kg) IBW/kg (Calculated) : 66.1  Temp (24hrs), Avg:97.7 F (36.5 C), Min:97.7 F (36.5 C), Max:97.7 F (36.5 C)  Recent Labs  Lab 12/06/18 0128 12/06/18 0224 12/06/18 0411  WBC 17.7*  --   --   CREATININE 0.56*  --   --   LATICACIDVEN  --  2.0* 2.0*    Estimated Creatinine Clearance: 63.1 mL/min (A) (by C-G formula based on SCr of 0.56 mg/dL (L)).    Allergies  Allergen Reactions  . Gabapentin Other (See Comments)    suicidal thoughts  . Lyrica [Pregabalin] Other (See Comments)    Ineffective in low doses, suicidal thoughts in high doses   . Jardiance [Empagliflozin]     Muscle aches and dysuria  . Celebrex [Celecoxib] Other (See Comments)    Nightmares  . Contrast Media [Iodinated Diagnostic Agents] Rash    Mild erythematous rash arms and abdomen, not pruritic    Thank you for allowing pharmacy to be a part of this patient's care.  Salome Arnt, PharmD, BCPS Clinical Pharmacist Please see AMION for all pharmacy numbers 12/06/2018 9:38 AM

## 2018-12-06 NOTE — ED Triage Notes (Signed)
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

## 2018-12-06 NOTE — Progress Notes (Addendum)
° °  Subjective: HD#0   Overnight: Reported to the ED  Today, Brett [name] was examined at bedside with his wife.  He states he is feeling well this morning and denies chest pain, dysuria, cough, shortness of breath.  He went on to describe chronic history of diarrhea  Objective:  Vital signs in last 24 hours: Vitals:   12/06/18 0745 12/06/18 0915 12/06/18 0930 12/06/18 1035  BP: 112/70 108/71 108/73 108/72  Pulse: 76 (!) 104 71 79  Resp: 16   20  Temp:      TempSrc:      SpO2: 100% 94% 100% 100%  Weight:      Height:       Const: Cachectic, in no apparent distress, lying comfortably in bed, conversational HEENT: Atraumatic, normocephalic Resp: CTA BL, no wheezes, crackles, rhonchi CV: RRR, no murmurs, gallop, rub Abd: Bowel sounds present, nondistended, nontender to palpation Ext: 1-2+ lower extremity pitting edema  Assessment/Plan:  Active Problems:   Hypotension  Brett [name] is a 58 year old gentleman with medical history significant for peripheral arterial disease, jejunal adenocarcinoma, metastatic lung cancer, chronic pain syndrome and hyperlipidemia who presented with asymptomatic hypotension.  #Hypotension (likely 2/2 chronic diarrhea)-improved This is likely secondary to chronic diarrhea which he has been experiencing for the past several months.  He does endorse good oral intake.  Per chart review, his weight last month was 101.8 kg (100 kg this admission).  His blood pressure has improved this morning with IV fluid resuscitation.  Initially there was concern for cardiogenic shock however transthoracic echocardiogram this a.m. showed normal EF of 60 to 88% without systolic dysfunction, diastolic dysfunction or valvular abnormalities.  EKG and troponin reassuring and less likely ACS.  Lactic acid mildly elevated at 2 (x2).  Per chart review, he has well-documented episodes of diarrhea dating back to March 2020 and has been on Lomotil as needed, previously on Imodium.  Index  of suspicion of chemotherapy-induced diarrhea is very high.  Also, C. difficile infection less likely. -Continue IVF resuscitation -Maintenance NS at 100 mL/hour -Lomotil prn  #Leukocytosis-improving He was noted to have an elevated WBC of 17 on arrival and given his immunosuppressive state, there was concern for possible underlying infection.  WBC did improve this a.m. to 15.4.  His absolute neutrophil count is 13.3% and ANC is 1995.  He did get an injection of pegfilgrastim (a GCSF) a couple weeks ago and could be the likely explanation.  He denies fevers, chills, dysuria, cough.  On examination, his Port-A-Cath site looks clean without any signs of infection. -Follow blood culture -Continue vancomycin and cefepime -Follow-up CBC  #Lower extremity edema Still has persistent 1-2+ lower extremity pitting edema.  Etiology is currently unclear at this point.  He does not have CHF, nephrotic syndrome given reassuring renal function, there is no history of cirrhosis.  Could be secondary to chemotherapy.  He did complete 40 cycles of Keytruda (pembrolizumab).  He was recently tried on carboplatin and Alimta for 2 cycles however it was discontinued because of intolerance and disease progression. He recently received Cyramza (ramucirumab) and peripheral edema can occur in 25% of patients. -Continue to monitor  FEN: Regular diet, IVF with NS 100 mL/hour VTE ppx: Subcutaneous Lovenox CODE STATUS: Full code  Dispo: Anticipated discharge in approximately 2-3 day(s).   Jean Rosenthal, MD 12/06/2018, 11:07 AM Pager: [phone] Internal Medicine Teaching Service

## 2018-12-07 DIAGNOSIS — F17210 Nicotine dependence, cigarettes, uncomplicated: Secondary | ICD-10-CM | POA: Diagnosis present

## 2018-12-07 DIAGNOSIS — Z95820 Peripheral vascular angioplasty status with implants and grafts: Secondary | ICD-10-CM | POA: Diagnosis not present

## 2018-12-07 DIAGNOSIS — Z681 Body mass index (BMI) 19 or less, adult: Secondary | ICD-10-CM | POA: Diagnosis not present

## 2018-12-07 DIAGNOSIS — E46 Unspecified protein-calorie malnutrition: Secondary | ICD-10-CM | POA: Diagnosis present

## 2018-12-07 DIAGNOSIS — C784 Secondary malignant neoplasm of small intestine: Secondary | ICD-10-CM | POA: Diagnosis present

## 2018-12-07 DIAGNOSIS — Z20828 Contact with and (suspected) exposure to other viral communicable diseases: Secondary | ICD-10-CM | POA: Diagnosis present

## 2018-12-07 DIAGNOSIS — D849 Immunodeficiency, unspecified: Secondary | ICD-10-CM | POA: Diagnosis present

## 2018-12-07 DIAGNOSIS — N39490 Overflow incontinence: Secondary | ICD-10-CM | POA: Diagnosis present

## 2018-12-07 DIAGNOSIS — R933 Abnormal findings on diagnostic imaging of other parts of digestive tract: Secondary | ICD-10-CM | POA: Diagnosis not present

## 2018-12-07 DIAGNOSIS — I739 Peripheral vascular disease, unspecified: Secondary | ICD-10-CM | POA: Diagnosis not present

## 2018-12-07 DIAGNOSIS — E860 Dehydration: Secondary | ICD-10-CM | POA: Diagnosis present

## 2018-12-07 DIAGNOSIS — R64 Cachexia: Secondary | ICD-10-CM | POA: Diagnosis present

## 2018-12-07 DIAGNOSIS — E1151 Type 2 diabetes mellitus with diabetic peripheral angiopathy without gangrene: Secondary | ICD-10-CM | POA: Diagnosis present

## 2018-12-07 DIAGNOSIS — G894 Chronic pain syndrome: Secondary | ICD-10-CM | POA: Diagnosis present

## 2018-12-07 DIAGNOSIS — E785 Hyperlipidemia, unspecified: Secondary | ICD-10-CM | POA: Diagnosis present

## 2018-12-07 DIAGNOSIS — L8990 Pressure ulcer of unspecified site, unspecified stage: Secondary | ICD-10-CM | POA: Insufficient documentation

## 2018-12-07 DIAGNOSIS — D72829 Elevated white blood cell count, unspecified: Secondary | ICD-10-CM | POA: Diagnosis not present

## 2018-12-07 DIAGNOSIS — K5289 Other specified noninfective gastroenteritis and colitis: Secondary | ICD-10-CM | POA: Diagnosis present

## 2018-12-07 DIAGNOSIS — E039 Hypothyroidism, unspecified: Secondary | ICD-10-CM | POA: Diagnosis present

## 2018-12-07 DIAGNOSIS — I452 Bifascicular block: Secondary | ICD-10-CM | POA: Diagnosis present

## 2018-12-07 DIAGNOSIS — K5903 Drug induced constipation: Secondary | ICD-10-CM | POA: Diagnosis not present

## 2018-12-07 DIAGNOSIS — T451X5A Adverse effect of antineoplastic and immunosuppressive drugs, initial encounter: Secondary | ICD-10-CM | POA: Diagnosis present

## 2018-12-07 DIAGNOSIS — K5900 Constipation, unspecified: Secondary | ICD-10-CM | POA: Diagnosis present

## 2018-12-07 DIAGNOSIS — R600 Localized edema: Secondary | ICD-10-CM | POA: Diagnosis present

## 2018-12-07 DIAGNOSIS — K529 Noninfective gastroenteritis and colitis, unspecified: Secondary | ICD-10-CM | POA: Diagnosis present

## 2018-12-07 DIAGNOSIS — R748 Abnormal levels of other serum enzymes: Secondary | ICD-10-CM | POA: Diagnosis present

## 2018-12-07 DIAGNOSIS — I9589 Other hypotension: Secondary | ICD-10-CM | POA: Diagnosis present

## 2018-12-07 DIAGNOSIS — E1140 Type 2 diabetes mellitus with diabetic neuropathy, unspecified: Secondary | ICD-10-CM | POA: Diagnosis present

## 2018-12-07 DIAGNOSIS — E872 Acidosis: Secondary | ICD-10-CM | POA: Diagnosis present

## 2018-12-07 DIAGNOSIS — C3491 Malignant neoplasm of unspecified part of right bronchus or lung: Secondary | ICD-10-CM | POA: Diagnosis present

## 2018-12-07 DIAGNOSIS — I959 Hypotension, unspecified: Secondary | ICD-10-CM | POA: Diagnosis not present

## 2018-12-07 LAB — COMPREHENSIVE METABOLIC PANEL
ALT: 19 U/L (ref 0–44)
AST: 26 U/L (ref 15–41)
Albumin: 2.3 g/dL — ABNORMAL LOW (ref 3.5–5.0)
Alkaline Phosphatase: 136 U/L — ABNORMAL HIGH (ref 38–126)
Anion gap: 6 (ref 5–15)
BUN: <5 mg/dL — ABNORMAL LOW (ref 6–20)
CO2: 26 mmol/L (ref 22–32)
Calcium: 7.6 mg/dL — ABNORMAL LOW (ref 8.9–10.3)
Chloride: 107 mmol/L (ref 98–111)
Creatinine, Ser: 0.55 mg/dL — ABNORMAL LOW (ref 0.61–1.24)
GFR calc Af Amer: >60 mL/min (ref >60)
GFR calc non Af Amer: >60 mL/min (ref >60)
Glucose, Bld: 175 mg/dL — ABNORMAL HIGH (ref 70–99)
Potassium: 3.9 mmol/L (ref 3.5–5.1)
Sodium: 139 mmol/L (ref 135–145)
Total Bilirubin: 0.5 mg/dL (ref 0.3–1.2)
Total Protein: 4.4 g/dL — ABNORMAL LOW (ref 6.5–8.1)

## 2018-12-07 LAB — CBC
HCT: 36.5 % — ABNORMAL LOW (ref 39.0–52.0)
Hemoglobin: 11.9 g/dL — ABNORMAL LOW (ref 13.0–17.0)
MCH: 30.9 pg (ref 26.0–34.0)
MCHC: 32.6 g/dL (ref 30.0–36.0)
MCV: 94.8 fL (ref 80.0–100.0)
Platelets: 220 10*3/uL (ref 150–400)
RBC: 3.85 MIL/uL — ABNORMAL LOW (ref 4.22–5.81)
RDW: 13.4 % (ref 11.5–15.5)
WBC: 14 10*3/uL — ABNORMAL HIGH (ref 4.0–10.5)
nRBC: 0 % (ref 0.0–0.2)

## 2018-12-07 LAB — GLUCOSE, CAPILLARY
Glucose-Capillary: 195 mg/dL — ABNORMAL HIGH (ref 70–99)
Glucose-Capillary: 103 mg/dL — ABNORMAL HIGH (ref 70–99)
Glucose-Capillary: 296 mg/dL — ABNORMAL HIGH (ref 70–99)

## 2018-12-07 MED ORDER — LEVOFLOXACIN 750 MG PO TABS
750.0000 mg | ORAL_TABLET | Freq: Every day | ORAL | Status: DC
  Administered 2018-12-08 – 2018-12-09 (×2): 750 mg via ORAL
  Filled 2018-12-07 (×2): qty 1

## 2018-12-07 MED ORDER — INSULIN ASPART 100 UNIT/ML ~~LOC~~ SOLN
0.0000 [IU] | Freq: Three times a day (TID) | SUBCUTANEOUS | Status: DC
  Administered 2018-12-07: 2 [IU] via SUBCUTANEOUS
  Administered 2018-12-08: 5 [IU] via SUBCUTANEOUS
  Administered 2018-12-08: 3 [IU] via SUBCUTANEOUS
  Administered 2018-12-08: 5 [IU] via SUBCUTANEOUS

## 2018-12-07 MED ORDER — PREDNISONE 20 MG PO TABS
50.0000 mg | ORAL_TABLET | Freq: Once | ORAL | Status: AC
  Administered 2018-12-07: 50 mg via ORAL
  Filled 2018-12-07: qty 2

## 2018-12-07 MED ORDER — DIPHENHYDRAMINE HCL 50 MG/ML IJ SOLN: 12.5000 mg | Freq: Once | INTRAMUSCULAR | Status: DC

## 2018-12-07 MED ORDER — SODIUM CHLORIDE 0.9% FLUSH: 10.0000 mL | INTRAVENOUS | Status: DC | PRN

## 2018-12-07 MED ORDER — SODIUM CHLORIDE 0.9 % IV SOLN
INTRAVENOUS | Status: AC
  Administered 2018-12-07: 23:00:00 via INTRAVENOUS

## 2018-12-07 MED ORDER — DIPHENHYDRAMINE HCL 50 MG/ML IJ SOLN
50.0000 mg | Freq: Once | INTRAMUSCULAR | Status: AC
  Administered 2018-12-07: 50 mg via INTRAVENOUS
  Filled 2018-12-07: qty 1

## 2018-12-07 MED ORDER — METRONIDAZOLE 500 MG PO TABS
500.0000 mg | ORAL_TABLET | Freq: Two times a day (BID) | ORAL | Status: DC
  Administered 2018-12-07 – 2018-12-09 (×5): 500 mg via ORAL
  Filled 2018-12-07 (×5): qty 1

## 2018-12-07 NOTE — Progress Notes (Addendum)
Progress Note  Patient Name: Brett [name] Date of Encounter: 12/07/2018  Primary Cardiologist:  No primary care provider on file. New, lives in [place] pretty well, no CP or SOB, edema is improving  Inpatient Medications    Scheduled Meds: . atorvastatin  40 mg Oral Daily  . Chlorhexidine Gluconate Cloth  6 each Topical Daily  . enoxaparin (LOVENOX) injection  30 mg Subcutaneous Q24H  . feeding supplement (ENSURE ENLIVE)  237 mL Oral BID BM  . levothyroxine  50 mcg Oral Q0600  . pantoprazole  40 mg Oral Daily  . sodium chloride flush  3 mL Intravenous Once   Continuous Infusions: . sodium chloride 100 mL/hr at 12/06/18 2244  . ceFEPime (MAXIPIME) IV Stopped (12/06/18 2245)  . vancomycin Stopped (12/06/18 1206)   PRN Meds: acetaminophen **OR** acetaminophen, diphenoxylate-atropine, oxyCODONE-acetaminophen **AND** oxyCODONE, senna-docusate   Vital Signs    Vitals:   12/06/18 1928 12/06/18 2000 12/06/18 2350 12/07/18 0500  BP: 96/69  93/60 96/63  Pulse: 75  (!) 59 77  Resp:   16 16  Temp:  98.4 F (36.9 C)  98.4 F (36.9 C)  TempSrc:  Oral  Oral  SpO2:  100% 98% 98%  Weight:    47.9 kg  Height:        Intake/Output Summary (Last 24 hours) at 12/07/2018 0748 Last data filed at 12/07/2018 0506 Gross per 24 hour  Intake 300 ml  Output 800 ml  Net -500 ml   Filed Weights   12/06/18 0113 12/06/18 1334 12/07/18 0500  Weight: 45.4 kg 46.4 kg 47.9 kg   Last Weight  Most recent update: 12/07/2018  5:04 AM   Weight  47.9 kg (105 lb 9.6 oz)           Weight change: 1.089 kg   Telemetry    SR, ST - Personally Reviewed  ECG    None today - Personally Reviewed  Physical Exam   General: Well developed, well nourished, male appearing in no acute distress. Head: Normocephalic, atraumatic.  Neck: Supple without bruits, JVD mildly elevated. Lungs:  Resp regular and unlabored, CTA. Heart: RRR, S1, S2, no S3, S4, or murmur; no rub.  Abdomen: Soft, non-tender, non-distended with normoactive bowel sounds. No hepatomegaly. No rebound/guarding. No obvious abdominal masses. Extremities: No clubbing, cyanosis, 1+ LE edema. Distal pedal pulses are 2+ bilaterally. Neuro: Alert and oriented X 3. Moves all extremities spontaneously. Psych: Normal affect.  Labs    Hematology Recent Labs  Lab 12/06/18 0128 12/06/18 0930  WBC 17.7* 15.4*  RBC 4.18* 4.23  HGB 13.1 13.0  HCT 39.4 40.3  MCV 94.3 95.3  MCH 31.3 30.7  MCHC 33.2 32.3  RDW 13.3 13.5  PLT 201 197    Chemistry Recent Labs  Lab 12/06/18 0128  NA 137  K 3.8  CL 100  CO2 28  GLUCOSE 68*  BUN 7  CREATININE 0.56*  CALCIUM 8.3*  PROT 5.3*  ALBUMIN 2.8*  AST 24  ALT 20  ALKPHOS 162*  BILITOT 0.6  GFRNONAA >60  GFRAA >60  ANIONGAP 9     High Sensitivity Troponin:   Recent Labs  Lab 12/06/18 0506 12/06/18 0624  TROPONINIHS 9 5      BNP Recent Labs  Lab 12/06/18 0128  BNP 93.7       Radiology    Dg Chest Port 1 View  Result Date: 12/06/2018 CLINICAL DATA:  History of lung carcinoma with peripheral edema  EXAM: PORTABLE CHEST 1 VIEW COMPARISON:  11/10/2018 FINDINGS: Cardiac shadow is within normal limits. Aortic calcifications are again noted. Left chest wall port is seen in satisfactory position. Postsurgical changes are noted on the right consistent with the patient's given clinical history. No focal infiltrate or sizable effusion is seen. No bony abnormality is noted. IMPRESSION: No acute abnormality noted. Postoperative changes are seen. Electronically Signed   By: Inez Catalina M.D.   On: 12/06/2018 02:44     Cardiac Studies   ECHO:  12/06/2018  1. Left ventricular ejection fraction, by visual estimation, is 60 to 65%. The left ventricle has normal function. There is no left ventricular hypertrophy.  2. Definity contrast agent was given IV to delineate the left ventricular endocardial borders.  3. Global right ventricle has normal  systolic function.The right ventricular size is normal. No increase in right ventricular wall thickness.  4. Left atrial size was normal.  5. Right atrial size was normal.  6. The mitral valve is normal in structure. Trace mitral valve regurgitation. No evidence of mitral stenosis.  7. The tricuspid valve is normal in structure. Tricuspid valve regurgitation is not demonstrated.  8. The aortic valve is normal in structure. Aortic valve regurgitation is not visualized. No evidence of aortic valve sclerosis or stenosis.  9. The pulmonic valve was normal in structure. Pulmonic valve regurgitation is not visualized. 10. Mildly elevated pulmonary artery systolic pressure. 11. The inferior vena cava is normal in size with greater than 50% respiratory variability, suggesting right atrial pressure of 3 mmHg.  Patient Profile     58 y.o. 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 LE edema - Echo results above. LV/RV are both normal, no LVH - RA pressure is 3 - may be 2nd chemo - albumin is 2.8 - no further cardiac workup is needed.   Otherwise, per IM Active Problems:   Hypotension    Signed, Rosaria Ferries , PA-C 7:48 AM 12/07/2018 Pager: [phone]  I have examined the patient and reviewed assessment and plan and discussed with patient.  Agree with above as stated.  Normal LV function.  Hypotension likely from dehydration.  No furtehr cardiac testing needed at this time.    Will signoff.   Larae Grooms

## 2018-12-07 NOTE — Progress Notes (Signed)
When assessing pt portacath this am no date was on it. Pt stated that it was accessed in the ED on 11/15. There was no documentation on who accessed the port. I placed order for IV team to assess. They stated every thing was good and added date. Carroll Kinds RN

## 2018-12-07 NOTE — Progress Notes (Signed)
Subjective:  Mr. Swiss was examined at beside this morning. He does not report any acute overnight events and does not have any acute concerns. He continues to have several bowel movements throughout the day. We discussed need for repeat CT scan of abdomen as prior exam showed colitis with large stool burden that may be contributing to his symptoms of overflow fecal incontinence.   Objective:  Vital signs in last 24 hours: Vitals:   12/06/18 1928 12/06/18 2000 12/06/18 2350 12/07/18 0500  BP: 96/69  93/60 96/63  Pulse: 75  (!) 59 77  Resp:   16 16  Temp:  98.4 F (36.9 C)  98.4 F (36.9 C)  TempSrc:  Oral  Oral  SpO2:  100% 98% 98%  Weight:    47.9 kg  Height:       Physical Exam Vitals signs reviewed.  Constitutional:      General: He is not in acute distress.    Appearance: Normal appearance. He is ill-appearing.  HENT:     Head: Normocephalic and atraumatic.  Cardiovascular:     Rate and Rhythm: Normal rate and regular rhythm.     Pulses: Normal pulses.     Heart sounds: Normal heart sounds.  Abdominal:     General: Abdomen is flat. Bowel sounds are normal. There is no distension.     Palpations: There is no mass.     Tenderness: There is no abdominal tenderness. There is no guarding or rebound.  Neurological:     Mental Status: He is alert.    Assessment/Plan:  Active Problems:   Hypotension  Mr. 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  #Hypotension- improved Patient presented with asymptomatic hypotension likely secondary to chronic diarrhea that he has been experiencing. His BP improved with fluid resuscitation with SBP in 90s. Initially concerns for cardiogenic shock; however TTE reassuring for preserved ejection fraction without any systolic or diastolic dysfunction  or valvular abnormalities.  - Continue IVF resuscitation with NS at 151ml/hr - Continue Lomotil prn   #Leukocytosis-improving He was noted to have an elevated WBC of 17 on arrival and given his immunosuppressive state, there was concern for possible underlying infection. WBC has been trending down 17>15.4>14.0. His elevated white count could be secondary to the GCSF he received a few weeks ago. However, prior CT scan on 10/20 with colitis with large burden of stool. His chronic diarrhea could be sign of overflow fecal incontinence. Given his immunocompromised status, will repeat CT abdomen scan. He has been afebrile since admission. He continues to have multiple bowel movements per day but denies any abdominal pain, nausea or vomiting. He does not have history of prolonged antibiotic use, therefore, unlikely C.difficile to be cause of patient's symptoms.  At this time, will focus on anaerobic and GI pathogen coverage with antibiotics in setting of possible colitis. Will repeat CT scan to evaluate for colitis. If continues to have significant stool burden, will consider enema vs GI consult. - CT Abdomen with contrast - Discontinue vancomycin - Continue cefepime day 2 and will change to cipro/flagyl tomorrow - Follow-up CBC  #Lower extremity edema Still has persistent 1-2+ lower extremity pitting edema.  Etiology is currently unclear at this point.  He does not have CHF, nephrotic syndrome given reassuring renal function, there is no history of cirrhosis.  Could be secondary to chemotherapy.  He did complete 40 cycles of Keytruda (pembrolizumab).  He was recently tried on carboplatin and Alimta for 2 cycles however it was discontinued because of intolerance and disease progression. He recently received Cyramza (ramucirumab) and peripheral edema can occur in 25% of patients. -Continue to monitor  FEN: Regular diet, IVF with NS 100 mL/hour VTE ppx: Subcutaneous Lovenox CODE STATUS: Full code  Dispo:  Anticipated discharge in approximately 2-3 day(s).   [name], MD  Internal Medicine, PGY-1 12/07/2018, 6:45 AM Pager: [phone]

## 2018-12-07 NOTE — Progress Notes (Signed)
  Date: 12/07/2018  Patient name: Brett [name]  Medical record number: [id number]  Date of birth: 1960-02-28        I have seen and evaluated this patient and I have discussed the plan of care with the house staff. Please see their note for complete details. I concur with their findings.  BP borderline low today with SBP in the 90s. Improved with IVFs. Blood cultures are NG x 1 day.   On further chart review, patient noted to have significant stool burden in his distal colon with associated colitis on CT abdomen dated 10/20. He reports chronic diarrhea with multiple loose BM a day. Unclear etiology of his colitis but suspicious for sterocoral colitis with his stool burden. Will repeat CT abdomen today. Doubt infectious etiology, but with his relative immunocompromised state will continue empiric treatment for now. He has already received IV cefepime this morning. Will add metronidazole and change cefepime to a flouroquinolone tomorrow.   Pending repeat CT, patient may need aggressive bowel regimen and enemas for constipation. Suspect his diarrhea is overflow diarrhea.    Velna Ochs, MD 12/07/2018, 1:10 PM

## 2018-12-07 NOTE — Progress Notes (Signed)
Initial Nutrition Assessment  RD working remotely.  DOCUMENTATION CODES:   Underweight, suspect malnutrition but RD unable to confirm without NFPE  INTERVENTION:   - Ensure Enlive po BID, each supplement provides 350 kcal and 20 grams of protein  - Magic Cup BID with lunch and dinner meals, each supplement provides 290 kcal and 9 grams of protein  - Encourage adequate PO intake  NUTRITION DIAGNOSIS:   Increased nutrient needs related to cancer and cancer related treatments as evidenced by estimated needs.  GOAL:   Patient will meet greater than or equal to 90% of their needs  MONITOR:   PO intake, Supplement acceptance, Labs, Weight trends, Skin  REASON FOR ASSESSMENT:   Malnutrition Screening Tool    ASSESSMENT:   58 year old male who presented to the ED on 11/15 with BLE swelling. 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   Noted plan for repeat CT abdomen today to assess stool burden.  Spoke with pt via phone call to room. Pt in good spirits and states he is doing well at this time.  Pt reports that he has a good appetite. Pt states that at home, he eats 3 meals daily and occasionally has a snack in between meals. A meal may include grits, eggs, bacon, and biscuits and gravy. Pt snacks on peanut butter crackers.  Pt states that he is eating well but is continuing to lost weight. Pt is unsure when his weight loss started but states his UBW is anywhere between 145-170 lbs.  Reviewed weight readings in chart. Pt with a 5.6 kg weight loss since 03/06/18. This is a 10.5% weight loss in 9 months which is not quite significant for timeframe but is concerning. Weight appears to be stable over the last 2 months or so. Per RN edema assessment, pt with mild pitting edema to BLE. Edema may be masking additional weight loss.  Pt reports eating well at both breakfast and lunch today and completing almost all of  his meal trays.  Pt states that he occasionally drinks oral nutrition supplements like Ensure or Carnation Instant Breakfast at home. Pt is amenable to continuing to drink these during admission. RD will also add Magic Cups with lunch and dinner meals.  Pt denies any N/V. Pt endorses diarrhea that he has had "for weeks." Pt believes the diarrhea may be related to his chemo medications.  Medications reviewed and include: Ensure Enlive BID, SSI, Protonix, Prednisone IVF: NS @ 100 ml/hr  Labs reviewed. CBG's: 186, 195  NUTRITION - FOCUSED PHYSICAL EXAM:  Unable to complete at this time. RD working remotely.  Diet Order:   Diet Order            Diet regular Room service appropriate? Yes with Assist; Fluid consistency: Thin  Diet effective now              EDUCATION NEEDS:   Education needs have been addressed  Skin:  Skin Assessment: Skin Integrity Issues: Skin Integrity Issues: Stage II: coccyx  Last BM:  12/06/18  Height:   Ht Readings from Last 1 Encounters:  12/06/18 5\' 7"  (1.702 m)    Weight:   Wt Readings from Last 1 Encounters:  12/07/18 47.9 kg    Ideal Body Weight:  67.3 kg  BMI:  Body mass index is 16.54 kg/m.  Estimated Nutritional Needs:   Kcal:  1750-1950  Protein:  75-90 grams  Fluid:  >/= 1.7 L  Gaynell Face, MS, RD, LDN Inpatient Clinical Dietitian Pager: [phone] Weekend/After Hours: [phone]

## 2018-12-08 ENCOUNTER — Telehealth: Payer: Self-pay | Admitting: Internal Medicine

## 2018-12-08 ENCOUNTER — Inpatient Hospital Stay (HOSPITAL_COMMUNITY): Payer: Medicare HMO

## 2018-12-08 DIAGNOSIS — K5903 Drug induced constipation: Secondary | ICD-10-CM

## 2018-12-08 DIAGNOSIS — Z79891 Long term (current) use of opiate analgesic: Secondary | ICD-10-CM

## 2018-12-08 DIAGNOSIS — R933 Abnormal findings on diagnostic imaging of other parts of digestive tract: Secondary | ICD-10-CM

## 2018-12-08 DIAGNOSIS — K5289 Other specified noninfective gastroenteritis and colitis: Principal | ICD-10-CM

## 2018-12-08 LAB — GLUCOSE, CAPILLARY
Glucose-Capillary: 195 mg/dL — ABNORMAL HIGH (ref 70–99)
Glucose-Capillary: 195 mg/dL — ABNORMAL HIGH (ref 70–99)
Glucose-Capillary: 210 mg/dL — ABNORMAL HIGH (ref 70–99)
Glucose-Capillary: 292 mg/dL — ABNORMAL HIGH (ref 70–99)
Glucose-Capillary: 287 mg/dL — ABNORMAL HIGH (ref 70–99)
Glucose-Capillary: 133 mg/dL — ABNORMAL HIGH (ref 70–99)

## 2018-12-08 LAB — CBC
HCT: 34 % — ABNORMAL LOW (ref 39.0–52.0)
Hemoglobin: 11.1 g/dL — ABNORMAL LOW (ref 13.0–17.0)
MCH: 30.7 pg (ref 26.0–34.0)
MCHC: 32.6 g/dL (ref 30.0–36.0)
MCV: 93.9 fL (ref 80.0–100.0)
Platelets: 229 10*3/uL (ref 150–400)
RBC: 3.62 MIL/uL — ABNORMAL LOW (ref 4.22–5.81)
RDW: 13.4 % (ref 11.5–15.5)
WBC: 10.6 10*3/uL — ABNORMAL HIGH (ref 4.0–10.5)
nRBC: 0 % (ref 0.0–0.2)

## 2018-12-08 LAB — BASIC METABOLIC PANEL
Anion gap: 8 (ref 5–15)
BUN: <5 mg/dL — ABNORMAL LOW (ref 6–20)
CO2: 26 mmol/L (ref 22–32)
Calcium: 7.6 mg/dL — ABNORMAL LOW (ref 8.9–10.3)
Chloride: 106 mmol/L (ref 98–111)
Creatinine, Ser: 0.5 mg/dL — ABNORMAL LOW (ref 0.61–1.24)
GFR calc Af Amer: >60 mL/min (ref >60)
GFR calc non Af Amer: >60 mL/min (ref >60)
Glucose, Bld: 234 mg/dL — ABNORMAL HIGH (ref 70–99)
Potassium: 3.8 mmol/L (ref 3.5–5.1)
Sodium: 140 mmol/L (ref 135–145)

## 2018-12-08 MED ORDER — SENNOSIDES-DOCUSATE SODIUM 8.6-50 MG PO TABS
1.0000 | ORAL_TABLET | Freq: Two times a day (BID) | ORAL | Status: DC
  Administered 2018-12-08 – 2018-12-09 (×2): 1 via ORAL
  Filled 2018-12-08 (×2): qty 1

## 2018-12-08 MED ORDER — POLYETHYLENE GLYCOL 3350 17 GM/SCOOP PO POWD
1.0000 | Freq: Once | ORAL | Status: AC
  Administered 2018-12-08: 225 g via ORAL
  Filled 2018-12-08: qty 255

## 2018-12-08 MED ORDER — POLYETHYLENE GLYCOL 3350 17 G PO PACK
17.0000 g | PACK | Freq: Every day | ORAL | Status: DC
  Administered 2018-12-08: 17 g via ORAL
  Filled 2018-12-08: qty 1

## 2018-12-08 MED ORDER — IOHEXOL 300 MG/ML  SOLN
100.0000 mL | Freq: Once | INTRAMUSCULAR | Status: AC | PRN
  Administered 2018-12-08: 100 mL via INTRAVENOUS

## 2018-12-08 MED ORDER — SORBITOL 70 % SOLN
960.0000 mL | TOPICAL_OIL | Freq: Once | ORAL | Status: AC
  Administered 2018-12-08: 960 mL via RECTAL
  Filled 2018-12-08: qty 473

## 2018-12-08 NOTE — Progress Notes (Addendum)
  Date: 12/08/2018  Patient name: Brett [name]  Medical record number: [id number]  Date of birth: [birth year]        I have seen and evaluated this patient and I have discussed the plan of care with the house staff. Please see their note for complete details. I concur with their findings.  Appreciate GI consultation. Patient initially presented with diarrhea and hypotension. Review of recent CT scan on 10/20 showed significant stool burden throughout the colon with associated distal colitis. We are treating him empirically with antibiotics given his immunocompromised state although I doubt this is infectious etiology. Repeat CT scan yesterday showed improvement in appearance of his inflamed distal colon and rectum, but persistent large stool burden throughout the entire colon. He is on chronic opioid therapy for his cancer pain. I suspect this is stercoral colitis with overflow diarrhea. Agree with starting bowel regimen, will follow up GI recommendations.   Velna Ochs, MD 12/08/2018, 12:12 PM

## 2018-12-08 NOTE — Telephone Encounter (Signed)
Called patient to inform 11/19 and 11/21 appointments have been cancelled, md nurse approved. Patient will give a call back once he is discharged.

## 2018-12-08 NOTE — Progress Notes (Signed)
Inpatient Diabetes Program Recommendations  AACE/ADA: New Consensus Statement on Inpatient Glycemic Control (2015)  Target Ranges:  Prepandial:   less than 140 mg/dL      Peak postprandial:   less than 180 mg/dL (1-2 hours)      Critically ill patients:  140 - 180 mg/dL   Lab Results  Component Value Date   GLUCAP 210 (H) 12/08/2018   HGBA1C 11.1 (A) 10/23/2018    Review of Glycemic Control  Diabetes history: DM 2 Outpatient Diabetes medications: Lantus 5 units, Glipizide 10 mg bis, Januvia 100 mg Daily, Actos 15 mg Daily Current orders for Inpatient glycemic control:  Novolog 0-9 units tid  A1c 11.1 on 10/2 at last PCP visit. Several medication changes at that time with 3 month follow up scheduled. Pt on Keytruda, which PCP made note to have worsened glucose control.  Magic cup lunch and dinner Ensure Enlive bid between meals  Did recieve PO Prednisone 50 mg x 3 doses yesterday.  Inpatient Diabetes Program Recommendations:    Fasting glucose 210 this am. Steroids yesterday. Consider increasing Novolog Correction scale to 0-15 units tid and add bedtime coverage.  Thanks,  Tama Headings RN, MSN, BC-ADM Inpatient Diabetes Coordinator Team Pager [phone] (8a-5p)

## 2018-12-08 NOTE — Consult Note (Addendum)
[facility] Consult: 11:19 AM 12/08/2018  LOS: 1 day    Referring Provider: Dr.Guilloud.   Primary Care Physician:  Sid Falcon, MD Primary Gastroenterologist:  [facility], inpt only    Reason for Consultation: Rectal inflammation.   HPI: Brett [name] is a 58 y.o. male.  Previous medical problems outlined below.  GI history significant for Barrett's esophagus on EGD in 2014. 11/2015 colonoscopy.  For IDA.  Fair colon prep.  Normal study to the ileocecal valve. 11/2015 EGD.  For IDA.  Normal study. 11/2015 capsule endoscopy.  Unable to find report. 12/2015 upper endoscopy to proximal jejunum.  For evaluation of abnormal capsule Endo.  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  Stage IV adenocarcinoma of lung DX 08/2015.  VATS, RUL lobectomy, lymph node dissection 2017.  Biopsy-proven mets to jejunum, terminal ileum resected 02/2016.  Carboplatin, Alimta discontinued 04/2016 due to progression.  Keytruda discontinued 10/2018 also for disease progression.   11/10/2018 CTAP w contrast.  Multiple new, enlarged RP and mesenteric lymph nodes and nodules.  "index nodule in central small bowel mesentery measures 3.8 x 2.3 cm, previously 3.2 x 1.8 cm... multiple new, bulky retroperitoneal lymph nodes, the largest left periaortic node measuring 2.5 x 2.3 cm."  Hepatic steatosis.  Trace pelvic ascites.  Large burden stool in distal colon with "hyperenhancing, inflamed appearance of distal colon and rectum, findings consistent with nonspecific infectious, inflammatory, or ischemic colitis."   At encounter with Dr. Earlie Server on 11/12/2018 there was discussion of palliative care vs hospice vs systemic chemo.  Patient opted for chemo and was started on docetaxel and Cyramza  11/19/2018.  Received Pegfilgrastim 11/21/2018.  Months long issue w non-bloody diarrhea.  At one point he was having bowel movements "every 5 minutes.  His physicians tried Imodium which did not help.  On October 2 his physician stopped Metformin and initiated Lomotil which helped reduce the frequency of stools and made his stools more soft and formed.  Has not been any belly pain, nausea or vomiting.  He eats pretty well but is still lost 30 pounds in the last year.  Scented to the ED 3 days ago for evaluation of bilateral lower extremity edema with blood pressure 66/58, WBC 17.7.  New RBBB, left posterior fascicular block on EKG but troponin is less than 5.  BNP 93.  Echocardiogram with LVEF 60 to 65%. Trace multivalve regurgitation.  Mild elevation pulmonary artery pressures.  No diastolic dysfunction.  Cardiology feels the chemotherapy  pain or nausea and vomiting.  Because his dentures do not work that well, he tends to eat small bits of solid food and chew his food well.  Has never had dysphagia or esophageal stricture. °Blood pressure somewhat improved from the 90s over 60s up to 101/63.  Pulse is normal. ° ° ° °Past Medical History:  °Diagnosis Date  °• Barrett's esophagus 07/01/2013  ° Without dysplasia on biopsy 09/03/2012. Repeat EGD recommended 08/2015  °• Bilateral cataracts 02/13/2017  °•  Carotid artery stenosis 07/01/2013  ° Requiring right sided stent   °• Chronic pain syndrome 07/01/2013  °• Closed head injury with brief loss of consciousness (HCC) 07/22/2010  ° Head trapped in a hydraulic device at work.  Fracture of orbital bones on right and brief loss of consciousness per report.  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 There was likely a strong psycho-social overlay in regard to the cognitive deficits in the form of mood disorder with psychotic features and mixed anxiety symptomatology. His primary tested cognitive deficits are in the areas of attention, executi  °• Daily headache "since 07/2010"  ° constantly  °• Degenerative joint disease of cervical spine 07/01/2013  °• Dupuytren's contracture of both hands 04/08/2014  °• Encounter for antineoplastic chemotherapy 10/05/2015  °• Encounter for antineoplastic immunotherapy 05/24/2016  °• Erectile dysfunction associated with type 2 diabetes mellitus (HCC) 07/01/2013  °• Fibromyalgia 07/01/2013  °• Goals of care, counseling/discussion 03/28/2016  °• History of blood transfusion 11/28/2015  ° "suppose to get his first today" (11/28/2015)  °• Hyperlipidemia LDL goal < 100 07/01/2013  °• Intractable hiccups 04/11/2016  °• 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  °• Memory changes   ° "memory issues" from head injury  °• Moderate protein-calorie malnutrition (HCC) 11/29/2015  °• Osteoarthritis of right thumb 10/21/2014  °• Peripheral vascular occlusive disease (HCC) 07/01/2013  ° Requiring 2 arterial stents above the left knee per report  °• Pneumonia ~ 2006/2007  °• Post traumatic stress disorder 07/01/2013  °• Primary lung adenocarcinoma (HCC) dx'd 08/2015  ° "right lung"  °• 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/15/2011  °• Tobacco abuse 07/01/2013  °• Type 2 diabetes mellitus with vascular disease (HCC)  07/01/2013  ° Left lower extremity and right carotid stenting  ° ° °Past Surgical History:  °Procedure Laterality Date  °• CAROTID STENT Right ?2014  °• COLONOSCOPY N/A 11/30/2015  ° Procedure: COLONOSCOPY;  Surgeon: John Hayes, MD;  Location: [facility];  Service: Endoscopy;  Laterality: N/A;  °• ESOPHAGOGASTRODUODENOSCOPY N/A 11/30/2015  ° Procedure: ESOPHAGOGASTRODUODENOSCOPY (EGD);  Surgeon: John Hayes, MD;  Location: [facility];  Service: Endoscopy;  Laterality: N/A;  °• ESOPHAGOGASTRODUODENOSCOPY (EGD) WITH PROPOFOL N/A 01/05/2016  ° Procedure: ESOPHAGOGASTRODUODENOSCOPY (EGD) WITH PROPOFOL;  Surgeon: John Hayes, MD;  Location: [facility];  Service: Endoscopy;  Laterality: N/A;  °• FEMORAL ARTERY STENT Left 05/2012; ~ 2015  ° /notes 06/04/2012; /spouse report  °• FRACTURE SURGERY    °• GIVENS CAPSULE STUDY N/A 12/22/2015  ° Procedure: GIVENS CAPSULE STUDY;  Surgeon: Salem F Ganem, MD;  Location: [facility];  Service: Endoscopy;  Laterality: N/A;  °• HARDWARE REMOVAL Right 11/15/2011  ° Removal of deep frontozygomatic orbital hardware/notes 11/15/2011  °• HERNIA REPAIR  1996  ° Umbilical  °• LAPAROSCOPY N/A 02/27/2016  ° Procedure: LAPAROSCOPY, LAPAROTOMY  WITH TWO SMALL BOWEL RESECTION;  Surgeon: Matthew Martin, MD;  Location:   [facility];  Service: General;  Laterality: N/A;  °• ORIF ORBITAL FRACTURE Right 08/15/2010  °  caught in a hydraulic machine; open reduction internal fixation of orbital rim fracture and open reduction of zygomatic arch fracture  /notes 10/13/2009  °• PORTACATH PLACEMENT Left 04/04/2016  ° Procedure: INSERTION PORT-A-CATH LEFT CHEST;  Surgeon: Steven C Hendrickson, MD;  Location: [facility];  Service: Thoracic;  Laterality: Left;  °• VIDEO ASSISTED THORACOSCOPY (VATS)/ LOBECTOMY Right 10/18/2015  ° Procedure: VIDEO ASSISTED THORACOSCOPY (VATS)/ LOBECTOMY;  Surgeon: Steven C Hendrickson, MD;  Location: [facility];  Service: Thoracic;  Laterality: Right;  °• VIDEO BRONCHOSCOPY Bilateral 09/21/2015  °  Procedure: VIDEO BRONCHOSCOPY WITH FLUORO;  Surgeon: Douglas B McQuaid, MD;  Location: [facility];  Service: Cardiopulmonary;  Laterality: Bilateral;  ° ° °Prior to Admission medications   °Medication Sig Start Date End Date Taking? Authorizing Provider  °aspirin EC 81 MG tablet Take 81 mg by mouth daily.    Yes [provider]  °atorvastatin (LIPITOR) 40 MG tablet Take 1 tablet (40 mg total) by mouth daily. 01/09/18 01/26/19 Yes Klima, Lawrence, MD  °dexamethasone (DECADRON) 4 MG tablet 4 mg p.o. twice daily the day before, day of and day after chemotherapy every 3 weeks. °Patient taking differently: Take 4 mg by mouth See admin instructions. Take 1 tablet. twice daily the day before, day of and day after chemotherapy every 3 weeks. 11/12/18  Yes Mohamed, Mohamed, MD  °diphenoxylate-atropine (LOMOTIL) 2.5-0.025 MG tablet Take 1 tablet by mouth 4 (four) times daily as needed for diarrhea or loose stools. 10/23/18  Yes Mullen, Emily B, MD  °glipiZIDE (GLUCOTROL) 10 MG tablet Take 1 tablet (10 mg total) by mouth 2 (two) times daily before a meal. 12/08/17  Yes Klima, Lawrence, MD  °Insulin Glargine (LANTUS) 100 UNIT/ML Solostar Pen Inject 5 Units into the skin daily. 10/23/18  Yes Mullen, Emily B, MD  °levothyroxine (SYNTHROID) 50 MCG tablet Take 1 tablet (50 mcg total) by mouth daily before breakfast. 07/30/18  Yes Heilingoetter, Cassandra L, PA-C  °lisinopril (ZESTRIL) 5 MG tablet Take 0.5 tablets (2.5 mg total) by mouth daily. 07/08/18  Yes Mullen, Emily B, MD  °oxyCODONE-acetaminophen (PERCOCET) 10-325 MG tablet Take 1 tablet by mouth every 6 (six) hours as needed for pain. 11/13/18 11/13/19 Yes Mullen, Emily B, MD  °pantoprazole (PROTONIX) 40 MG tablet TAKE 1 TABLET(40 MG) BY MOUTH DAILY °Patient taking differently: Take 40 mg by mouth daily.  11/27/18  Yes Mullen, Emily B, MD  °pioglitazone (ACTOS) 15 MG tablet Take 1 tablet (15 mg total) by mouth daily. 10/23/18  Yes Mullen, Emily B, MD  °prochlorperazine  (COMPAZINE) 10 MG tablet Take 10 mg by mouth every 6 (six) hours as needed for nausea or vomiting.   Yes [provider]  °sitaGLIPtin (JANUVIA) 100 MG tablet Take 1 tablet (100 mg total) by mouth daily. 01/09/18  Yes Klima, Lawrence, MD  °ACCU-CHEK SOFTCLIX LANCETS lancets Use to test blood glucose 1-2 times daily. Dx Code E11.59 10/03/17   Klima, Lawrence, MD  °Blood Glucose Monitoring Suppl (ACCU-CHEK AVIVA PLUS) w/Device KIT Use to test blood glucose 1-2 times daily. Dx Code E11.59 04/06/18   Klima, Lawrence, MD  °glucose blood test strip USE TO TEST BLOOD GLUCOSE 1 TO 2 TIMES DAILY 03/30/18   Klima, Lawrence, MD  °Insulin Pen Needle (PEN NEEDLES) 32G X 4 MM MISC 1 pen by Does not apply route 2 (two) times daily. Dx Code E11.59 12/08/17   Klima, Lawrence, MD  °  test blood glucose 1-2 times daily. Dx Code E11.59 10/03/17   [name], MD  Blood Glucose Monitoring Suppl (ACCU-CHEK AVIVA PLUS) w/Device KIT Use to test blood glucose 1-2 times daily. Dx Code E11.59 04/06/18   [name], MD  glucose blood test strip USE TO TEST BLOOD GLUCOSE 1 TO 2 TIMES DAILY 03/30/18   [name], MD  Insulin Pen Needle (PEN NEEDLES) 32G X 4 MM MISC 1 pen by Does not apply route 2 (two) times daily. Dx Code E11.59 12/08/17   [name], MD  oxyCODONE-acetaminophen (PERCOCET) 10-325 MG tablet Take 1-2 tablets by mouth every 6 (six) hours as needed for pain. Patient not taking: Reported on 12/06/2018 10/26/18   Sid Falcon, MD  predniSONE (DELTASONE) 50 MG tablet TAKE 1 TABLET BY MOUTH AT 13 HOURS, 7 HOURS, AND 1 HOUR PRIOR TO SCAN Patient not taking: Reported on 12/06/2018 11/05/18   Curt Bears, MD  Probiotic Product (CULTURELLE IMMUNE DEFENSE) CAPS Take 1 capsule by mouth daily. Patient not taking: Reported on 12/06/2018 06/05/18   Sid Falcon, MD  predniSONE (DELTASONE) 50 MG tablet TAKE 1 TABLET BY MOUTH AT 13 HOURS, 7 HOURS, AND 1 HOUR PRIOR TO SCAN 05/28/18   Heilingoetter, Cassandra L, PA-C    Scheduled Meds:  atorvastatin  40 mg Oral Daily   Chlorhexidine Gluconate Cloth  6 each Topical Daily   enoxaparin (LOVENOX) injection  30 mg Subcutaneous Q24H   feeding supplement (ENSURE ENLIVE)  237 mL Oral BID BM   insulin aspart  0-9 Units Subcutaneous TID WC   levofloxacin  750 mg Oral Daily   levothyroxine  50 mcg Oral Q0600   metroNIDAZOLE  500 mg Oral Q12H   pantoprazole  40 mg Oral Daily   polyethylene glycol powder  1 Container Oral Once    senna-docusate  1 tablet Oral BID   sodium chloride flush  3 mL Intravenous Once   sorbitol, milk of mag, mineral oil, glycerin (SMOG) enema  960 mL Rectal Once   Infusions:  PRN Meds: acetaminophen **OR** acetaminophen, oxyCODONE-acetaminophen **AND** oxyCODONE, sodium chloride flush   Allergies as of 12/06/2018 - Review Complete 12/06/2018  Allergen Reaction Noted   Gabapentin Other (See Comments) 05/26/2013   Lyrica [pregabalin] Other (See Comments) 04/02/2016   Jardiance [empagliflozin]  11/28/2017   Celebrex [celecoxib] Other (See Comments) 07/01/2013   Contrast media [iodinated diagnostic agents] Rash 05/17/2016    Family History  Problem Relation Age of Onset   Heart disease Mother    Diabetes Mother    Diabetes Father    Heart attack Father    Diabetes Sister    Coronary artery disease Sister        s/p CABG   Diabetes Brother    Healthy Daughter    Healthy Son    Diabetes Sister    Healthy Sister    Healthy Sister    Diabetes Brother    Coronary artery disease Brother        s/p CABG   Diabetes Brother    Healthy Brother    Healthy Brother    Healthy Daughter     Social History   Socioeconomic History   Marital status: Married    Spouse name: Not on file   Number of children: Not on file   Years of education: Not on file   Highest education level: Not on file  Occupational History   Not on file  Social Needs   Financial resource strain: Not on  file   Food insecurity    Worry: Not on file    Inability: Not on file   Transportation needs    Medical: Not on file    Non-medical: Not on file  Tobacco Use   Smoking status: Current Every Day Smoker    Packs/day: 1.00    Years: 35.00    Pack years: 35.00    Types: Cigarettes    Last attempt to quit: 10/18/2015    Years since quitting: 3.1   Smokeless tobacco: Never Used   Tobacco comment: Sometimes less.  Substance and Sexual Activity   Alcohol use: Yes     Alcohol/week: 0.0 standard drinks    Comment: Rarely.   Drug use: No   Sexual activity: Not Currently    Birth control/protection: None    Comment: Wife had tubes tied  Lifestyle   Physical activity    Days per week: Not on file    Minutes per session: Not on file   Stress: Not on file  Relationships   Social connections    Talks on phone: Not on file    Gets together: Not on file    Attends religious service: Not on file    Active member of club or organization: Not on file    Attends meetings of clubs or organizations: Not on file    Relationship status: Not on file   Intimate partner violence    Fear of current or ex partner: Not on file    Emotionally abused: Not on file    Physically abused: Not on file    Forced sexual activity: Not on file  Other Topics Concern   Not on file  Social History Narrative   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  Currently unemployed.  Finished 9th grade.  Lives with wife, 2 daughters, and mother-in-law in [place].    REVIEW OF SYSTEMS: Constitutional: Some fatigue and mild weakness. ENT:  No nose bleeds Pulm: No new shortness of breath or cough. CV:  No palpitations, no angina.  Lower extremity edema as per HPI. GU:  No hematuria, no frequency.  Overall good urine output of straw-colored urine. GI: See HPI. Heme: Usual or excessive bleeding or bruising. Transfusions: None mentioned in the records. Neuro:  No headaches, no peripheral tingling or numbness.  No dizziness, no syncope, no seizures. Derm: Lost hair from his head and beard due to the recent new chemotherapy.  No itching, no rash or sores.  Endocrine:  No sweats or chills.  No polyuria or dysuria Immunization: Not queried. Travel:  None beyond local counties in last few months.    PHYSICAL EXAM: Vital signs in last 24 hours: Vitals:   12/07/18 2007 12/08/18 0537  BP:  100/67 101/63  Pulse: 86 88  Resp:    Temp: 98.3 F (36.8 C) 97.7 F (36.5 C)  SpO2: 96% 99%   Wt Readings from Last 3 Encounters:  12/08/18 48.1 kg  11/12/18 46 kg  10/23/18 47.7 kg    General: Pleasant, thin/cachectic looking, comfortable WM.  Appears his stated age.  Alert Head: No facial asymmetry or swelling.  No signs of head trauma. Eyes: No scleral icterus.  No conjunctival pallor.  EOMI. Ears: Not hard of hearing. Nose: No congestion, no discharge. Mouth: Full denture in place on the upper jaw.  Oral mucosa is moist, pink, clear.  Tongue midline. Neck: No  Intake/Output from previous day: °11/16 0701 - 11/17 0700 °In: 1520 [P.O.:720; I.V.:800] °Out: 900 [Urine:900] °Intake/Output this shift: °No intake/output data recorded. ° °LAB RESULTS: °Recent Labs  °  12/06/18 °0930 12/07/18 °0800 12/08/18 °0500  °WBC 15.4* 14.0* 10.6*  °HGB 13.0 11.9* 11.1*  °HCT 40.3 36.5* 34.0*  °PLT 197 220 229  ° °BMET °Lab Results  °Component Value Date  ° NA 140 12/08/2018  ° NA 139 12/07/2018  ° NA 137 12/06/2018  ° K 3.8 12/08/2018  ° K 3.9 12/07/2018  ° K 3.8 12/06/2018  ° CL 106 12/08/2018  ° CL 107 12/07/2018  ° CL 100 12/06/2018  ° CO2 26 12/08/2018  ° CO2 26 12/07/2018  ° CO2 28 12/06/2018  ° GLUCOSE 234 (H) 12/08/2018  ° GLUCOSE 175 (H) 12/07/2018  ° GLUCOSE 68 (L) 12/06/2018  ° BUN  <5 (L) 12/08/2018  ° BUN <5 (L) 12/07/2018  ° BUN 7 12/06/2018  ° CREATININE 0.50 (L) 12/08/2018  ° CREATININE 0.55 (L) 12/07/2018  ° CREATININE 0.56 (L) 12/06/2018  ° CALCIUM 7.6 (L) 12/08/2018  ° CALCIUM 7.6 (L) 12/07/2018  ° CALCIUM 8.3 (L) 12/06/2018  ° °LFT °Recent Labs  °  12/06/18 °0128 12/07/18 °0800  °PROT 5.3* 4.4*  °ALBUMIN 2.8* 2.3*  °AST 24 26  °ALT 20 19  °ALKPHOS 162* 136*  °BILITOT 0.6 0.5  ° °PT/INR °Lab Results  °Component Value Date  ° INR 1.0 12/06/2018  ° INR 1.01 04/04/2016  ° INR 0.95 10/17/2015  ° °Hepatitis Panel °No results for input(s): HEPBSAG, HCVAB, HEPAIGM, HEPBIGM in the last 72 hours. °C-Diff °No components found for: CDIFF °Lipase  °   °Component Value Date/Time  ° LIPASE 24 12/23/2015 0148  ° ° °Drugs of Abuse  °   °Component Value Date/Time  ° LABOPIA NEG 07/15/2014 1133  ° COCAINSCRNUR NEG 07/15/2014 1133  ° LABBENZ NEG 07/15/2014 1133  ° AMPHETMU NEG 07/15/2014 1133  ° THCU NEG 07/15/2014 1133  ° LABBARB NEG 07/15/2014 1133  °  ° °RADIOLOGY STUDIES: °Ct Abdomen Pelvis W Contrast ° °Result Date: 12/08/2018 °CLINICAL DATA:  Abd pain, gastroenteritis or colitis suspected EXAM: CT ABDOMEN AND PELVIS WITH CONTRAST TECHNIQUE: Multidetector CT imaging of the abdomen and pelvis was performed using the standard protocol following bolus administration of intravenous contrast. CONTRAST:  100mL OMNIPAQUE IOHEXOL 300 MG/ML  SOLN COMPARISON:  Multiple prior exams. Most recent CT 11/10/2018 FINDINGS: Lower chest: Trace left pleural effusion with minimal adjacent atelectasis. Right lung bases clear. Hepatobiliary: Advanced hepatic steatosis. No focal hepatic abnormality. Small calcified gallstones without discrete pericholecystic inflammation. No biliary dilatation. Pancreas: No ductal dilatation or inflammation. Spleen: Normal in size without focal abnormality. Adrenals/Urinary Tract: Normal adrenal glands. No hydronephrosis or perinephric edema. Homogeneous renal enhancement with symmetric  excretion on delayed phase imaging. Urinary bladder is physiologically distended without wall thickening. Stomach/Bowel: Stomach is unremarkable. Administered enteric contrast reaches distal small bowel. No small bowel obstruction or inflammatory change. Enteric chain sutures noted in the mid small bowel. Air-filled appendix without periappendiceal inflammation. Large volume of stool throughout the entire colon. Improved hyperenhancing inflamed appearance of the distal colon and rectum. Rectum is decompressed with mild rectal wall thickening. No perirectal or perianal collection. Vascular/Lymphatic: Advanced aortic atherosclerosis. No aneurysm. The portal vein is patent. Multifocal retroperitoneal adenopathy with centrally hypodense/necrotic nodes, retroperitoneal adenopathy has slightly progressed from prior exam. For example left periaortic node at the level of the left renal vein series 3, image 32 measures 3.1 x 2.6 cm,   or inflammatory change. Enteric chain sutures noted in the mid small bowel. Air-filled appendix without periappendiceal inflammation. Large volume of stool throughout the entire colon. Improved hyperenhancing inflamed appearance of the distal colon and rectum. Rectum is decompressed with mild rectal wall thickening. No perirectal or perianal collection. Vascular/Lymphatic: Advanced aortic atherosclerosis. No aneurysm. The portal vein is patent. Multifocal retroperitoneal adenopathy with centrally hypodense/necrotic nodes, retroperitoneal adenopathy has slightly progressed from prior exam. For example left periaortic node at the level of the left renal vein series 3, image 32 measures 3.1 x 2.6 cm, previously 2.5 x 2.3 cm. Multiple large mesenteric nodes, many of which are centrally hypodense/necrotic. Index nodule in small bowel mesentery measures 3.0 x 1.8 cm, series 3, image 37, previously 3.8 x 2.3 cm. Small retrocrural nodes are unchanged. Reproductive: Heterogeneous prostate gland with right prostatic calcification. Other: Increased volume of abdominopelvic ascites from prior exam. Ascites ileum is small to moderate. No free air or intra-abdominal abscess. Musculoskeletal: There are no acute or suspicious osseous abnormalities. IMPRESSION: 1. Large volume of stool throughout the entire colon. Improved hyperenhancing inflamed appearance of the distal colon and rectum from prior exam, mild rectal wall thickening persists. 2. Increased volume of abdominopelvic ascites from prior exam, small to moderate. 3. Progressive retroperitoneal adenopathy. Have mesenteric adenopathy has slightly improved. 4. Advanced hepatic steatosis. Cholelithiasis without gallbladder inflammation. 5. Trace left  pleural effusion with adjacent atelectasis. Aortic Atherosclerosis (ICD10-I70.0). Electronically Signed   By: Keith Rake M.D.   On: 12/08/2018 01:12      IMPRESSION:   *   Metastatic adenocarcinoma of lung.  Jejunal/terminal ileal mets resected 02/2016.  Subsequent disease progression with latest findings of retroperitoneal and mesenteric adenopathy.  Acceleration of chemotherapy, latest/newest chemotherapy initiated 11/19/2018.   *    Constipation, distal colonic/rectal inflammation seen on CT scans of 11/10/2018 and 12/08/2018.   Previous issues with diarrhea, improved with Lomotil.  *    Abdominopelvic ascites.  ? Malignant.    *     Hepatic steatosis.  CT not consistent with cirrhosis.  *   DM2.  Poorly controlled. A1c 11 in early 10/2018.      PLAN:     *    Flexible sigmoidoscopy vs colonoscopy with biopsy? Nothing in the patient's symptomatology suggest that he could not tolerate a bowel prep in fact attending physician has ordered a MiraLAX/Gatorade prep to try to clear this stool from his colon but he is still on solid food.  *     ?Paracentesis, send fluid for cytology?   Azucena Freed  12/08/2018, 11:19 AM Phone [phone]  I have reviewed the entire case in detail with the above APP and discussed the plan in detail.  Therefore, I agree with the diagnoses recorded above. In addition,  I have personally interviewed and examined the patient and have personally reviewed any abdominal/pelvic CT scan images (two most recent CTAP scans).  My additional thoughts are as follows:  He has a progressive metastatic malignancy despite recent salvage therapy.  I agree that this appears to be chronic opioid induced constipation with overflow diarrhea based on his overall presentation and imaging results.  I personally reviewed the scans.  It is not certain there is true inflammation in the rectum, since it is relatively collapsed on both recent scans.  No impaction is seen on  either those studies.  He may have some chronic edema in the rectal wall from chronic constipation, but I do not think he has a  poor with advancing malignancy and hypoalbuminemia.  He has continued to lose weight and is now on salvage therapy.  The ascites on imaging is relatively small volume, I think it is perhaps from his malignancy but most likely from malnutrition and hypoalbuminemia.  Paracentesis is not necessary. ° °Signing off, we will be glad to see him as needed. ° ° L Danis III °Office:[phone] ° °  °

## 2018-12-08 NOTE — Telephone Encounter (Signed)
Returned patient's phone call regarding cancelling 11/19 and 11/21 appointment per 11/17 scheduled message, wife informed he is currently in the hospital and she doesn't know when he'll be leaving. Sent providers nurse regarding this cancellation request.

## 2018-12-08 NOTE — Progress Notes (Signed)
Subjective:  HD #2. Brett [name] was examined at beside this morning. Patient is doing well this AM. He has 3 loose bowel movements yesterday. Discussed the results of his CT abdomen and the significant stool burden. Discussed overflow diarrhea and scheduled stool softeners and laxatives. He is anxious to go home but voices understanding. He asks that we contact his wife. All questions and concerns addressed.   Objective:  Vital signs in last 24 hours: Vitals:   12/06/18 2350 12/07/18 0500 12/07/18 2007 12/08/18 0537  BP: 93/60 96/63 100/67 101/63  Pulse: (!) 59 77 86 88  Resp: 16 16    Temp:  98.4 F (36.9 C) 98.3 F (36.8 C) 97.7 F (36.5 C)  TempSrc:  Oral Oral Oral  SpO2: 98% 98% 96% 99%  Weight:  47.9 kg  48.1 kg  Height:       Physical Exam Vitals signs reviewed.  Constitutional:      General: He is not in acute distress.    Appearance: Normal appearance. He is ill-appearing.  HENT:     Head: Normocephalic and atraumatic.  Cardiovascular:     Rate and Rhythm: Normal rate and regular rhythm.     Pulses: Normal pulses.     Heart sounds: Normal heart sounds.  Abdominal:     General: Abdomen is flat. Bowel sounds are normal. There is no distension.     Palpations: There is no mass.     Tenderness: There is no abdominal tenderness. There is no guarding or rebound.  Musculoskeletal:     Comments: 1-2+ pitting edema of bilateral lower extremities  Neurological:     Mental Status: He is alert.     CT ABDOMEN/PELVIS WITH CONTRAST 12/08/2018: IMPRESSION: 1. Large volume of stool throughout the entire colon. Improved hyperenhancing inflamed appearance of the distal colon and rectum from prior exam, mild rectal wall thickening persists. 2. Increased volume of abdominopelvic ascites from prior exam, small to moderate. 3. Progressive retroperitoneal adenopathy. Have mesenteric adenopathy has slightly improved. 4. Advanced hepatic steatosis. Cholelithiasis without  gallbladder inflammation. 5. Trace left pleural effusion with adjacent atelectasis.  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 His hypotension resolved with fluid resuscitation. Suspect that his hypotension is secondary to his chronic diarrhea which could be from overflow incontinence secondary to sterocoral colitis.   Sterocoral colitis  Brett [name] noted to have significant stool burden in distal colon with associated colitis on CT abdomen on 10/20. He has been having significant amount of chronic diarrhea that could be sign of overflow fecal incontinence. Repeat CT abdomen with continued large amount of stool throughout the colon with colitis, although the colitis has slightly improved from prior studies. Due to signficant stool burden, will start with scheduled Senna and Miralax prep and smog enema.  He continues to have multiple loose bowel movements per day but denies any abdominal pain, nausea or vomiting. Doubt that the colitis is secondary to infectious etiology but will continue patient on levaquin and flagyl for anaerobic and GI pathogen coverage for 3 days. His WBC is trending down 15.4>14.0>10.6 and he has remained afebrile since admission.  - Continue flagyl day 2, levaquin day 1 - Senna bid - Miralax prep - SMOG enema  - GI consulted, appreciate their recommendations  - Follow-up CBC  Hypotension- improved Patient presented with asymptomatic hypotension likely secondary  to chronic diarrhea that he has been experiencing. His BP improved with fluid resuscitation with SBP in 100s. Initially concerns for cardiogenic shock; however TTE reassuring for preserved ejection fraction without any systolic or diastolic dysfunction or valvular abnormalities.  - Continue IVF  resuscitation with NS at 175ml/hr - Continue Lomotil prn   #Lower extremity edema - improving  Still has 1-2+ pitting edema, slightly improved from prior exam.  This is most likely secondary to chemotherapy as patient does not have history of heart failure, nephrotic syndrome or evidence of cirrhosis.  - Continue to monitor   FEN: Regular diet, IVF with NS 100 mL/hour VTE ppx: Subcutaneous Lovenox CODE: Full code  Dispo: Anticipated discharge in approximately 2-3 day(s).   Brett Heck, MD  Internal Medicine, PGY-1 12/08/2018, 7:11 AM Pager: [phone]

## 2018-12-08 NOTE — Progress Notes (Signed)
Patient drank Miralax mixed 960 ml, when he  started the second 960 ml when he started vomiting. RN told patient to slow down and drink it slowly. SMOG enema given to patient and patient had large bowel movement. Patient stated feeling better, he had a bath and all the linen was changed. RN recommends patient to finish his miralax. Will continue to monitor patient.

## 2018-12-09 LAB — BASIC METABOLIC PANEL
Anion gap: 8 (ref 5–15)
BUN: <5 mg/dL — ABNORMAL LOW (ref 6–20)
CO2: 29 mmol/L (ref 22–32)
Calcium: 8.2 mg/dL — ABNORMAL LOW (ref 8.9–10.3)
Chloride: 105 mmol/L (ref 98–111)
Creatinine, Ser: 0.52 mg/dL — ABNORMAL LOW (ref 0.61–1.24)
GFR calc Af Amer: >60 mL/min (ref >60)
GFR calc non Af Amer: >60 mL/min (ref >60)
Glucose, Bld: 96 mg/dL (ref 70–99)
Potassium: 3.9 mmol/L (ref 3.5–5.1)
Sodium: 142 mmol/L (ref 135–145)

## 2018-12-09 LAB — GLUCOSE, CAPILLARY
Glucose-Capillary: 109 mg/dL — ABNORMAL HIGH (ref 70–99)
Glucose-Capillary: 258 mg/dL — ABNORMAL HIGH (ref 70–99)

## 2018-12-09 LAB — CBC
HCT: 35.7 % — ABNORMAL LOW (ref 39.0–52.0)
Hemoglobin: 11.5 g/dL — ABNORMAL LOW (ref 13.0–17.0)
MCH: 30.3 pg (ref 26.0–34.0)
MCHC: 32.2 g/dL (ref 30.0–36.0)
MCV: 93.9 fL (ref 80.0–100.0)
Platelets: 305 10*3/uL (ref 150–400)
RBC: 3.8 MIL/uL — ABNORMAL LOW (ref 4.22–5.81)
RDW: 13.6 % (ref 11.5–15.5)
WBC: 11.3 10*3/uL — ABNORMAL HIGH (ref 4.0–10.5)
nRBC: 0 % (ref 0.0–0.2)

## 2018-12-09 MED ORDER — INSULIN ASPART 100 UNIT/ML ~~LOC~~ SOLN
0.0000 [IU] | Freq: Three times a day (TID) | SUBCUTANEOUS | Status: DC
  Administered 2018-12-09: 8 [IU] via SUBCUTANEOUS

## 2018-12-09 MED ORDER — HEPARIN SOD (PORK) LOCK FLUSH 100 UNIT/ML IV SOLN
500.0000 [IU] | INTRAVENOUS | Status: DC | PRN
  Filled 2018-12-09: qty 5

## 2018-12-09 MED ORDER — POLYETHYLENE GLYCOL 3350 17 G PO PACK: 17.0000 g | PACK | Freq: Every day | ORAL | 0 refills | Status: AC

## 2018-12-09 NOTE — Discharge Summary (Signed)
Name: Brett [name] MRN: [id number] DOB: Jun 09, 1960 58 y.o. PCP: Sid Falcon, MD  Date of Admission: 12/06/2018 12:58 AM Date of Discharge: 12/09/2018 Attending Physician: Dr. Velna Ochs  Discharge Diagnosis: 1. Stercoral colitis 2. Hypotension 2/2 chronic diarrhea 3. Lower extremity edema   Discharge Medications: Allergies as of 12/09/2018      Reactions   Gabapentin Other (See Comments)   suicidal thoughts   Lyrica [pregabalin] Other (See Comments)   Ineffective in low doses, suicidal thoughts in high doses    Jardiance [empagliflozin]    Muscle aches and dysuria   Celebrex [celecoxib] Other (See Comments)   Nightmares   Contrast Media [iodinated Diagnostic Agents] Rash   Mild erythematous rash arms and abdomen, not pruritic      Medication List    STOP taking these medications   diphenoxylate-atropine 2.5-0.025 MG tablet Commonly known as: LOMOTIL     TAKE these medications   Accu-Chek Aviva Plus w/Device Kit Use to test blood glucose 1-2 times daily. Dx Code E11.59   Accu-Chek Softclix Lancets lancets Use to test blood glucose 1-2 times daily. Dx Code E11.59   aspirin EC 81 MG tablet Take 81 mg by mouth daily.   atorvastatin 40 MG tablet Commonly known as: Lipitor Take 1 tablet (40 mg total) by mouth daily.   Culturelle Immune Defense Caps Take 1 capsule by mouth daily.   dexamethasone 4 MG tablet Commonly known as: DECADRON 4 mg p.o. twice daily the day before, day of and day after chemotherapy every 3 weeks. What changed:   how much to take  how to take this  when to take this  additional instructions   glipiZIDE 10 MG tablet Commonly known as: GLUCOTROL Take 1 tablet (10 mg total) by mouth 2 (two) times daily before a meal.   glucose blood test strip USE TO TEST BLOOD GLUCOSE 1 TO 2 TIMES DAILY   Insulin Glargine 100 UNIT/ML Solostar Pen Commonly known as: LANTUS Inject 5 Units into the skin daily.   levothyroxine 50 MCG  tablet Commonly known as: Synthroid Take 1 tablet (50 mcg total) by mouth daily before breakfast.   lisinopril 5 MG tablet Commonly known as: ZESTRIL Take 0.5 tablets (2.5 mg total) by mouth daily.   oxyCODONE-acetaminophen 10-325 MG tablet Commonly known as: Percocet Take 1-2 tablets by mouth every 6 (six) hours as needed for pain.   oxyCODONE-acetaminophen 10-325 MG tablet Commonly known as: Percocet Take 1 tablet by mouth every 6 (six) hours as needed for pain.   pantoprazole 40 MG tablet Commonly known as: PROTONIX TAKE 1 TABLET(40 MG) BY MOUTH DAILY What changed: See the new instructions.   Pen Needles 32G X 4 MM Misc 1 pen by Does not apply route 2 (two) times daily. Dx Code E11.59   pioglitazone 15 MG tablet Commonly known as: ACTOS Take 1 tablet (15 mg total) by mouth daily.   polyethylene glycol 17 g packet Commonly known as: MIRALAX / GLYCOLAX Take 17 g by mouth daily.   predniSONE 50 MG tablet Commonly known as: DELTASONE TAKE 1 TABLET BY MOUTH AT 13 HOURS, 7 HOURS, AND 1 HOUR PRIOR TO SCAN   prochlorperazine 10 MG tablet Commonly known as: COMPAZINE Take 10 mg by mouth every 6 (six) hours as needed for nausea or vomiting.   sitaGLIPtin 100 MG tablet Commonly known as: Januvia Take 1 tablet (100 mg total) by mouth daily.       Disposition and follow-up:   Brett  W [name] was discharged from [facility] in Stable condition.  At the hospital follow up visit please address:  1.  Stercoral colitis: Patient with chronic diarrhea; prior CT abdomen in October with large stool burden and colitis; repeat CT abdomen during this admission with continued significant stool burden throughout colon and colitis. Patient given Miralax prep and smog enema resulting in improvement of symptoms. Patient to be continued on Miralax 1/2 capsule daily/every other day for goal of 1-2 BM per day.   Hypotension 2/2 chronic diarrhea: Patient presented with  asymptomatic hypotension secondary to chronic diarrhea that improved with fluid resuscitation. Please ensure he has adequate PO intake.   BLE edema:  Patient has 1-2+ pitting edema of lower extremities; suspect this is secondary to his chemotherapy for metastatic adenocarcinoma of the lung and jejunal adenocarcinoma. Patient instructed to wear compression stockings to help and to follow up with his oncologist to assess for chemotherapy side effects.   2.  Labs / imaging needed at time of follow-up: CBC, BMP  3.  Pending labs/ test needing follow-up: none  Follow-up Appointments: Follow-up Information    Curt Bears, MD. Schedule an appointment as soon as possible for a visit in 1 week(s).   Specialty: Oncology Contact information: [place] [phone]        [facility]. Go on 12/10/2018.   Why: 12/10/2018 at 2:15pm for hospital follow up  Contact information: [address] [place] [facility] Course by problem list: 1. 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 Prior CT Abdomen on 11/10/2018 with large stool burden in distal colon with findings consistent with nonspecific colitis.  Due to concerns for stercoral colitis, repeated CT Abdomen/Pelvis which was significant for large volume of stool throughout the entire colon with improved appearance of colitis. Patient given Senna BID, Miralax prep, and SMOG enema to assist in bowel movements. GI consulted during admission and recommended for regular Miralax use to prevent future constipation due to chronic opioid use. Due to elevated WBC of 17 on presentation, patient placed on broad spectrum antibiotics which were narrowed during admission to  levaquin and flagyl for GI pathogen coverage. However, unlikely that his leukocytosis was secondary to infectious etiology but rather secondary to the GCS-F he received one week prior to admission. Patient discharged with instructions to take 1/2 capsule of Miralax daily/every other day for goal of 1-2 bowel movements per day.   2. Hypotension: Patient presented with asymptomatic hypotension with SBP in 60s with normal HR and oxygenating well on RA. CMP and BNP wnl. Lactic acid mildly elevated at 2.0. EKG with new RBBB and left posterior fascicular block. Troponins negative. Cardiology consulted to rule out cardiogenic shock. Patient euvolemic on examination and Echo with normal EF. His hypotension was not cardiogenic in etiology and improved with fluid resuscitation. Suspect the hypotension was secondary to poor oral intake vs chemotherapy side effects.   3. Bilateral lower extremity edema:  Patient presented with 2+ pitting edema of bilateral lower extremities. Etiology is currently unclear at this point.  He does not have CHF, nephrotic syndrome given reassuring renal function, there is no history of cirrhosis.  Could be secondary to chemotherapy.  He did complete 40 cycles of Keytruda (pembrolizumab).  He was recently  tried on carboplatin and Alimta for 2 cycles however it was discontinued because of intolerance and disease progression. He recently received Cyramza (ramucirumab) and peripheral edema can occur in 25% of patients. Patient instructed to use compression stockings and follow up with oncologist for further management.   Discharge Vitals:   BP 122/77 (BP Location: Right Arm)   Pulse 67   Temp (!) 97.5 F (36.4 C) (Oral)   Resp 16   Ht '5\' 7"'  (1.702 m)   Wt 47.4 kg   SpO2 100%   BMI 16.35 kg/m   Pertinent Labs, Studies, and Procedures:  CBC Latest Ref Rng & Units 12/09/2018 12/08/2018 12/07/2018  WBC 4.0 - 10.5 K/uL 11.3(H) 10.6(H) 14.0(H)  Hemoglobin 13.0 - 17.0 g/dL 11.5(L)  11.1(L) 11.9(L)  Hematocrit 39.0 - 52.0 % 35.7(L) 34.0(L) 36.5(L)  Platelets 150 - 400 K/uL 305 229 220   BMP Latest Ref Rng & Units 12/09/2018 12/08/2018 12/07/2018  Glucose 70 - 99 mg/dL 96 234(H) 175(H)  BUN 6 - 20 mg/dL <5(L) <5(L) <5(L)  Creatinine 0.61 - 1.24 mg/dL 0.52(L) 0.50(L) 0.55(L)  BUN/Creat Ratio 9 - 20 - - -  Sodium 135 - 145 mmol/L 142 140 139  Potassium 3.5 - 5.1 mmol/L 3.9 3.8 3.9  Chloride 98 - 111 mmol/L 105 106 107  CO2 22 - 32 mmol/L '29 26 26  ' Calcium 8.9 - 10.3 mg/dL 8.2(L) 7.6(L) 7.6(L)    CHEST X-RAY 12/06/2018: IMPRESSION: No acute abnormality noted. Postoperative changes are seen.  CT ABDOMEN PELVIS W CONTRAST 12/08/2018: IMPRESSION: 1. Large volume of stool throughout the entire colon. Improved hyperenhancing inflamed appearance of the distal colon and rectum from prior exam, mild rectal wall thickening persists. 2. Increased volume of abdominopelvic ascites from prior exam, small to moderate. 3. Progressive retroperitoneal adenopathy. Have mesenteric adenopathy has slightly improved. 4. Advanced hepatic steatosis. Cholelithiasis without gallbladder inflammation. 5. Trace left pleural effusion with adjacent atelectasis.  Discharge Instructions: Discharge Instructions    Call MD for:  difficulty breathing, headache or visual disturbances   Complete by: As directed    Call MD for:  extreme fatigue   Complete by: As directed    Call MD for:  hives   Complete by: As directed    Call MD for:  persistant dizziness or light-headedness   Complete by: As directed    Call MD for:  persistant nausea and vomiting   Complete by: As directed    Call MD for:  redness, tenderness, or signs of infection (pain, swelling, redness, odor or green/yellow discharge around incision site)   Complete by: As directed    Call MD for:  severe uncontrolled pain   Complete by: As directed    Call MD for:  temperature >100.4   Complete by: As directed    Diet - low  sodium heart healthy   Complete by: As directed    Increase activity slowly   Complete by: As directed       Brett [name], Brett [name] were admitted for concerns of low blood pressures from your chronic diarrhea.  On imaging, you had significant amount of stool built up in your intestines from chronic constipation. We discussed that due to your constipation, you were having overflow incontinence of your stools resulting in your diarrhea symptoms. During your stay, we gave you an enema and also Miralax to help you move your bowels better. On discharge, please continue to use Miralax daily or every other day. The goal is to have 1-2  bowel movements per day. Please follow up with your PCP.   For your leg swelling, it is likely that this is from your chemotherapy treatment. Please follow up with your oncologist, Dr. Julien Nordmann. For symptom control, you may try using compression stockings and elevation of your legs.   Thank you!  Signed: [name], MD  Internal Medicine, PGY-1 12/09/2018, 4:45 PM   Pager: [phone]

## 2018-12-09 NOTE — Progress Notes (Signed)
Subjective:  HD #3. Brett [name] was examined at beside this morning. He is sitting on the side of the bed. He is doing well this morning and does not report any acute concerns overnight. He was able to take the Miralax prep yesterday and enema and had multiple formed large bowel movements. Discussed with patient regarding importance of maintaining 1-2 bowel movements per day.   Objective:  Vital signs in last 24 hours: Vitals:   12/08/18 0537 12/08/18 1346 12/08/18 2051 12/09/18 0608  BP: 101/63 112/70 104/71 122/77  Pulse: 88 67 75 67  Resp:   16 16  Temp: 97.7 F (36.5 C) (!) 97.5 F (36.4 C) 98.1 F (36.7 C) (!) 97.5 F (36.4 C)  TempSrc: Oral Oral Oral Oral  SpO2: 99% 100% 100% 100%  Weight: 48.1 kg     Height:       Physical Exam Vitals signs reviewed.  Constitutional:      General: He is not in acute distress.    Appearance: Normal appearance. He is ill-appearing.  HENT:     Head: Normocephalic and atraumatic.  Cardiovascular:     Rate and Rhythm: Normal rate and regular rhythm.     Pulses: Normal pulses.     Heart sounds: Normal heart sounds.  Abdominal:     General: Abdomen is flat. Bowel sounds are normal. There is no distension.     Palpations: There is no mass.     Tenderness: There is no abdominal tenderness. There is no guarding or rebound.  Musculoskeletal:     Comments: 1-2+ pitting edema of bilateral lower extremities, improved from prior exam  Skin:    General: Skin is warm and dry.  Neurological:     Mental Status: He is alert and oriented to person, place, and time.    CT ABDOMEN/PELVIS WITH CONTRAST 12/08/2018: IMPRESSION: 1. Large volume of stool throughout the entire colon. Improved hyperenhancing inflamed appearance of the distal colon and rectum from prior exam, mild rectal wall thickening persists. 2. Increased volume of abdominopelvic ascites from prior exam, small to moderate. 3. Progressive retroperitoneal adenopathy. Have mesenteric  adenopathy has slightly improved. 4. Advanced hepatic steatosis. Cholelithiasis without gallbladder inflammation. 5. Trace left pleural effusion with adjacent atelectasis.  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 His hypotension resolved with fluid resuscitation. Likely that his hypotension was secondary to his chronic diarrhea from overflow incontinence secondary to his stercoral colitis.   Stercoral colitis  CT abdomen on 11/17 with significant stool burden throughout colon with colitis that was improved from prior. He was given Miralax prep and SMOG enema resulting in multiple bowel movements over night that were well formed. He has completed three days of antibiotics at this point and does not need to continue as his source of colitis is likely not infectious but rather from his fecal burden.  - Discontinue antibiotics - Discharge to home with Miralax daily  - CBC at follow up   Hypotension- improved Patient presented with asymptomatic hypotension likely secondary to chronic diarrhea that he has been experiencing. His BP improved with fluid resuscitation with SBP in 100s. Initially concerns for cardiogenic shock; however TTE reassuring for preserved ejection fraction without any systolic or diastolic dysfunction or valvular abnormalities.  - Encourage good PO intake   #Lower extremity edema - improving  Still has 1-2+ pitting  edema, improved from prior exam.  This is most likely secondary to chemotherapy as patient does not have history of heart failure, nephrotic syndrome or evidence of cirrhosis. Discussed with patient need to follow up with oncologist. Will  - Continue to monitor   FEN: Regular diet VTE ppx: Subcutaneous Lovenox CODE: Full code  Dispo: Anticipated  discharge in approximately 2-3 day(s).   Brett Heck, MD  Internal Medicine, PGY-1 12/09/2018, 6:25 AM Pager: [phone]

## 2018-12-09 NOTE — Progress Notes (Signed)
  Date: 12/09/2018  Patient name: Brett [name]  Medical record number: [id number]  Date of birth: 1960/11/03        I have seen and evaluated this patient and I have discussed the plan of care with the house staff. Please see their note for complete details. I concur with their findings with the following additions/corrections:   GI has recommended low dose miralax 1/2 capsule daily or every other day on discharge to avoid degree of constipation seen on imaging.   Will plan for discharge today with follow up with his PCP and oncologist regarding his LE edema. Recommended compression socks or stockings.   Velna Ochs, MD 12/09/2018, 12:29 PM

## 2018-12-09 NOTE — Discharge Instructions (Signed)
[name] [name],  You were admitted for concerns of low blood pressures from your chronic diarrhea.  On imaging, you had significant amount of stool built up in your intestines from chronic constipation. We discussed that due to your constipation, you were having overflow incontinence of your stools resulting in your diarrhea symptoms. During your stay, we gave you an enema and also Miralax to help you move your bowels better. On discharge, please continue to use Miralax daily or every other day. The goal is to have 1-2 bowel movements per day. Please follow up with your PCP.   For your leg swelling, it is likely that this is from your chemotherapy treatment. Please follow up with your oncologist, Dr. Julien Nordmann. For symptom control, you may try using compression stockings and elevation of your legs.   Thank you!

## 2018-12-10 ENCOUNTER — Other Ambulatory Visit: Payer: Self-pay

## 2018-12-10 ENCOUNTER — Ambulatory Visit: Payer: Medicare HMO

## 2018-12-10 ENCOUNTER — Ambulatory Visit (INDEPENDENT_AMBULATORY_CARE_PROVIDER_SITE_OTHER): Payer: Medicare HMO | Admitting: Internal Medicine

## 2018-12-10 ENCOUNTER — Ambulatory Visit: Payer: Medicare HMO | Admitting: Physician Assistant

## 2018-12-10 ENCOUNTER — Encounter: Payer: Self-pay | Admitting: Internal Medicine

## 2018-12-10 ENCOUNTER — Other Ambulatory Visit: Payer: Medicare HMO

## 2018-12-10 VITALS — BP 100/67 | HR 84 | Temp 98.1°F | Ht 67.0 in | Wt 111.2 lb

## 2018-12-10 DIAGNOSIS — E118 Type 2 diabetes mellitus with unspecified complications: Secondary | ICD-10-CM | POA: Diagnosis not present

## 2018-12-10 DIAGNOSIS — I959 Hypotension, unspecified: Secondary | ICD-10-CM | POA: Diagnosis not present

## 2018-12-10 DIAGNOSIS — C799 Secondary malignant neoplasm of unspecified site: Secondary | ICD-10-CM | POA: Diagnosis not present

## 2018-12-10 DIAGNOSIS — E1159 Type 2 diabetes mellitus with other circulatory complications: Secondary | ICD-10-CM

## 2018-12-10 DIAGNOSIS — R609 Edema, unspecified: Secondary | ICD-10-CM

## 2018-12-10 DIAGNOSIS — C3491 Malignant neoplasm of unspecified part of right bronchus or lung: Secondary | ICD-10-CM

## 2018-12-10 DIAGNOSIS — Z794 Long term (current) use of insulin: Secondary | ICD-10-CM

## 2018-12-10 DIAGNOSIS — Z79899 Other long term (current) drug therapy: Secondary | ICD-10-CM

## 2018-12-10 DIAGNOSIS — R197 Diarrhea, unspecified: Secondary | ICD-10-CM | POA: Diagnosis not present

## 2018-12-10 DIAGNOSIS — Z681 Body mass index (BMI) 19 or less, adult: Secondary | ICD-10-CM | POA: Diagnosis not present

## 2018-12-10 DIAGNOSIS — K529 Noninfective gastroenteritis and colitis, unspecified: Secondary | ICD-10-CM | POA: Diagnosis not present

## 2018-12-10 DIAGNOSIS — R634 Abnormal weight loss: Secondary | ICD-10-CM

## 2018-12-10 LAB — BASIC METABOLIC PANEL
Anion gap: 5 (ref 5–15)
BUN: <5 mg/dL — ABNORMAL LOW (ref 6–20)
CO2: 27 mmol/L (ref 22–32)
Calcium: 8.3 mg/dL — ABNORMAL LOW (ref 8.9–10.3)
Chloride: 104 mmol/L (ref 98–111)
Creatinine, Ser: 0.49 mg/dL — ABNORMAL LOW (ref 0.61–1.24)
GFR calc Af Amer: >60 mL/min (ref >60)
GFR calc non Af Amer: >60 mL/min (ref >60)
Glucose, Bld: 159 mg/dL — ABNORMAL HIGH (ref 70–99)
Potassium: 3.9 mmol/L (ref 3.5–5.1)
Sodium: 136 mmol/L (ref 135–145)

## 2018-12-10 LAB — CBC
HCT: 40 % (ref 39.0–52.0)
Hemoglobin: 12.9 g/dL — ABNORMAL LOW (ref 13.0–17.0)
MCH: 30.6 pg (ref 26.0–34.0)
MCHC: 32.3 g/dL (ref 30.0–36.0)
MCV: 95 fL (ref 80.0–100.0)
Platelets: 361 10*3/uL (ref 150–400)
RBC: 4.21 MIL/uL — ABNORMAL LOW (ref 4.22–5.81)
RDW: 13.8 % (ref 11.5–15.5)
WBC: 10.8 10*3/uL — ABNORMAL HIGH (ref 4.0–10.5)
nRBC: 0 % (ref 0.0–0.2)

## 2018-12-10 MED ORDER — INSULIN ASPART 100 UNIT/ML FLEXPEN: PEN_INJECTOR | SUBCUTANEOUS | 3 refills | Status: AC

## 2018-12-10 MED ORDER — PEN NEEDLES 32G X 4 MM MISC: 1.0000 "pen " | Freq: Two times a day (BID) | 6 refills | Status: AC

## 2018-12-10 NOTE — Patient Instructions (Addendum)
Mr. Carden,   We talked about a lot of things today.  The most important to things will be to STOP lisinopril and pioglitazone.   We measure you for compression stockings to help with the lower extremity swelling.  Make sure to ask your cancer doctor about other possible chemotherapy options.  For your diabetes, you will start doing sliding scale insulin. Check your sugar before you eat and inject the amount of insulin below based on your sugar. I ordered NovoLog with pen needles for you to use.  Sugar 70-120: No insulin Sugars 121-150: 1 unit Sugars 151-200: 2 units Sugars 201-250: 3 units Sugars 251-300: 5 units Sugars 301-350: 7 units Sugars 351-400: 9 units  Butch Penny, our diabetes educator, will give you call in a few days to see how your sugars are doing.  Make sure to continue using Miralax 1/2 tablet either daily or every other day with the goal of having 1-2 bowel movements per day.   If you have any questions or concerns give [place] a call let us know.  - Dr. Frederico Hamman

## 2018-12-11 ENCOUNTER — Encounter: Payer: Self-pay | Admitting: Internal Medicine

## 2018-12-11 LAB — CULTURE, BLOOD (ROUTINE X 2)
Culture: NO GROWTH
Culture: NO GROWTH
Special Requests: ADEQUATE
Special Requests: ADEQUATE

## 2018-12-11 NOTE — Assessment & Plan Note (Signed)
Patient was admitted 11/15-11/18 with asymptomatic hypotension that was thought to be secondary to chronic diarrhea and poor PO intake. He was treated with empiric broad-spectrum antibiotics given his immunocompromised state due to metastatic lung cancer, as well as IV fluids with improvement in BP. CXR, UA, and blood cultures negative for infection. BP today is soft, 100/67, but he remains asymptomatic. I stopped his lisinopril, he is only on 2.5 mg QD for renal protection in the setting of DM but I think the risks of continuing it outweigh the benefits at this time given his history of metastatic lung malignancy.

## 2018-12-11 NOTE — Assessment & Plan Note (Addendum)
Patient has a history of insulin-dependent, uncontrolled T2DM with last A1c at 11.1 on 10/2. He is on pioglitazone, glipizide, and Januvia. Has not use his Lantus in a while due to labile BG at home per wife. She states AM BG usually 50-80s with spikes up to the 200-300s after meals. This is all in the setting of weight loss from metastatic lung cancer. I recommended we stop pioglitazone due to LE edema and start a sensitive sliding scale (as below) Novolog BID with patient's largest meals. They are in agreement with plan. Will continue Januvia and glipizide, and hold Lantus. I asked Butch Penny to call him on Monday or early next week to see how his BG is doing and if we need to increase insulin dose.   Sugar 70-120: No insulin Sugars 121-150: 1 unit Sugars 151-200: 2 units Sugars 201-250: 3 units Sugars 251-300: 5 units Sugars 301-350: 7 units Sugars 351-400: 9 units

## 2018-12-11 NOTE — Assessment & Plan Note (Signed)
Patient has a history of chronic diarrhea that is thought to be secondary to one of his chemotherapy agents, Keytruda, for metastatic lung cancer.  His medications were adjusted to help with this in the outpatient setting including switching Metformin to pioglitazone and starting Imodium and a trial of Lomotil.  However, during recent admission 11/15-11/18 for hypotension 2/2 chronic diarrhea he was found to have a large stool burden throughout his entire colon on CT abdomen/pelvis as well as colitis.  He was treated stercoral colitis with overflow diarrhea with bowel prep and enemas with improvement in symptoms.  He was also evaluated by GI during admission who agreed with plan.  They recommended MiraLAX 1/2 tablet either daily or every other day with a goal of 1-2 BMs per day. He was only discharged yesterday so unable to assess how he is doing on this regimen. Asked to follow up with [place] in 2 weeks to reassess.

## 2018-12-11 NOTE — Assessment & Plan Note (Signed)
Patient was noted to have new bilateral lower extremity pitting edema on recent admission that was thought to be secondary to one of his chemotherapeutic agents for metastatic lung malignancy.  He was recommended to discuss his chemotherapy regimen with his oncologist as well as purchasing compression stockings to help with the edema.  He presents today for follow-up and reports no improvement in edema (only discharged yesterday).  We measured his LEs today and were able get him fitted compression stockings.  I also stopped his pioglitazone as one of the most common side effect is lower extremity edema. Will continue to monitor.

## 2018-12-11 NOTE — Progress Notes (Signed)
   CC: Hospital follow-up for hypotension and diarrhea  HPI:  [name] is a 58 y.o. year-old male with PMH listed below who presents to clinic for hospital follow-up for hypertension and diarrhea. Please see problem based assessment and plan for further details.   Past Medical History:  Diagnosis Date  . Barrett's esophagus 07/01/2013   Without dysplasia on biopsy 09/03/2012. Repeat EGD recommended 08/2015  . Bilateral cataracts 02/13/2017  . Carotid artery stenosis 07/01/2013   Requiring right sided stent   . Chronic pain syndrome 07/01/2013  . Closed head injury with brief loss of consciousness ([place]) 07/22/2010   Head trapped in a hydraulic device at work.  Fracture of orbital bones on right and brief loss of consciousness per report.  . 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 There was likely a strong psycho-social overlay in regard to the cognitive deficits in the form of mood disorder with psychotic features and mixed anxiety symptomatology. His primary tested cognitive deficits are in the areas of attention, executi  . Daily headache "since 07/2010"   constantly  . Degenerative joint disease of cervical spine 07/01/2013  . Dupuytren's contracture of both hands 04/08/2014  . Encounter for antineoplastic chemotherapy 10/05/2015  . Encounter for antineoplastic immunotherapy 05/24/2016  . Erectile dysfunction associated with type 2 diabetes mellitus ([place]) 07/01/2013  . Fibromyalgia 07/01/2013  . Goals of care, counseling/discussion 03/28/2016  . History of blood transfusion 11/28/2015   "suppose to get his first today" (11/28/2015)  . Hyperlipidemia LDL goal < 100 07/01/2013  . Intractable hiccups 04/11/2016  . 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 Memory changes    "memory issues" from head injury  . Moderate protein-calorie malnutrition ([place])  11/29/2015  . Osteoarthritis of right thumb 10/21/2014  . Peripheral vascular occlusive disease ([place]) 07/01/2013   Requiring 2 arterial stents above the left knee per report  . Pneumonia ~ 2006/2007  . Post traumatic stress disorder 07/01/2013  . Primary lung adenocarcinoma ([place]) dx'd 08/2015   "right lung"  . 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 Tobacco abuse 07/01/2013  . Type 2 diabetes mellitus with vascular disease ([place]) 07/01/2013   Left lower extremity and right carotid stenting   Review of Systems:   Review of Systems  Constitutional: Positive for weight loss. Negative for chills, fever and malaise/fatigue.  Gastrointestinal: Negative for abdominal pain, diarrhea, nausea and vomiting.  Neurological: Negative for dizziness and headaches.    Physical Exam:  Vitals:   12/10/18 1454 12/10/18 1457  BP:  100/67  Pulse:  84  Temp:  98.1 F (36.7 C)  TempSrc:  Oral  SpO2:  100%  Weight: 111 lb 3.2 oz (50.4 kg)   Height: 5\' 7"  (1.702 m)     General: Chronically ill-appearing male, very thin, in no acute distress Cardiac: regular rate and rhythm, nl S1/S2, no murmurs, rubs or gallops   Pulm: CTAB, no wheezes or crackles, no increased work of breathing on room air  Abd: soft, NTND, bowel sounds present Ext: warm and well perfused, 1-2+ pitting edema on bilateral LE L>R    Assessment & Plan:   See Encounters Tab for problem based charting.  Patient discussed with Dr. Dareen Piano

## 2018-12-11 NOTE — Progress Notes (Signed)
Internal Medicine Clinic Attending  Case discussed with Dr. Santos-Sanchez at the time of the visit.  We reviewed the resident's history and exam and pertinent patient test results.  I agree with the assessment, diagnosis, and plan of care documented in the resident's note.    

## 2018-12-12 ENCOUNTER — Ambulatory Visit: Payer: Medicare HMO

## 2018-12-15 ENCOUNTER — Telehealth: Payer: Self-pay | Admitting: Dietician

## 2018-12-15 NOTE — Telephone Encounter (Signed)
Sugars have been running low 69 in the morning. His wife feels they are "too low". She was not home to give me detailed numbers. She reports that he has not needed the correction insulin at all and is taking glipizide 10 mg twice a day and Januvia. I asked her to decrease the glipizide to 10 mg a day and we will follow up in 1 week.

## 2018-12-16 NOTE — Telephone Encounter (Signed)
Agree! Thank you so much for reaching out!

## 2018-12-18 ENCOUNTER — Other Ambulatory Visit: Payer: Self-pay | Admitting: Medical Oncology

## 2018-12-18 DIAGNOSIS — E039 Hypothyroidism, unspecified: Secondary | ICD-10-CM

## 2018-12-18 MED ORDER — LEVOTHYROXINE SODIUM 50 MCG PO TABS: 50.0000 ug | ORAL_TABLET | Freq: Every day | ORAL | 2 refills | Status: AC

## 2018-12-29 ENCOUNTER — Telehealth: Payer: Self-pay | Admitting: Dietician

## 2018-12-29 ENCOUNTER — Other Ambulatory Visit: Payer: Self-pay | Admitting: *Deleted

## 2018-12-29 DIAGNOSIS — G894 Chronic pain syndrome: Secondary | ICD-10-CM

## 2018-12-29 MED ORDER — PROCHLORPERAZINE MALEATE 10 MG PO TABS: 10.0000 mg | ORAL_TABLET | Freq: Four times a day (QID) | ORAL | 5 refills | Status: AC | PRN

## 2018-12-29 NOTE — Telephone Encounter (Signed)
Per [facility] pharmacist, pt just picked up 240 oxy 12/1, attempted to call , no answer, no vmail

## 2018-12-29 NOTE — Telephone Encounter (Signed)
After speaking w/ [place]. Pt and family may be ready for palliative care referral

## 2018-12-29 NOTE — Telephone Encounter (Signed)
Blood sugar -not checking recently, not taking glipizide 10 mg/day, Januvia, not using insulin.suggested wife check his blood sugar at least every other day.   Wife wonders if he needs to be re scanned lying down and standing because he is better laying down than when he is standing up. She wonders if he has a kidney stone. He is hurting on the left now rather than the right.  She also wonders if he can get Home health or someone to administer fluids at home if needed. He gets sick and  cannot eat/drink, then gets dehydrated. They agree he might benefit from a prescription for nausea.   I told her I would route this note to triage and Dr. Daryll Drown on her behalf. She also requests a Refill pain meds

## 2018-12-30 NOTE — Telephone Encounter (Signed)
Have tried to call both ph#s again, cannot reach anyone, cannot lm

## 2018-12-30 NOTE — Telephone Encounter (Signed)
Some of these things can be provided through his oncologist.  I would anticipate Oncology and the navigator at that group may be better able to provide these services?    If he recently picked up a prescription, will need to speak with someone about further refills.  If you get in touch with them, please ask about the recent refill.   Thanks

## 2018-12-30 NOTE — Progress Notes (Signed)
[facility] OFFICE PROGRESS NOTE  Sid Falcon, MD [place] [address]  DIAGNOSIS: Stage IV (T1b, N0, M1b) non-small cell lung cancer, adenocarcinoma presented with right upper lobe lung nodule and recent metastasis to the small intestine. This was initially diagnosed in September 2017  Genomic Alterations Identified? ERBB2 amplification - equivocal? CDKN2A p16INK4a E88* and p14ARF W737T SMARCA4 splice site [id number]>VO SPTA1 E2022* TOP2A amplification TP53 A159P Additional Findings? Microsatellite status MS-Stable Tumor Mutation Burden TMB-Intermediate; 18 Muts/Mb Additional Disease-relevant Genes with No Reportable Alterations Identified? EGFR KRAS ALK BRAF MET RET ROS1   PRIOR THERAPY: 1) Status post right VATS with right upper lobectomy and mediastinal lymph node dissection under the care of Dr. Roxan Hockey on 10/18/2015 and the final pathology was consistent with stage IA (T1b, N0, MX). 2) upper endoscopy on 01/05/2016 showed normal esophagus, normal stomach but there was occasional mass around 3.0 CM in length circumferential nonobstructing in the jejunum. The final pathology was consistent with metastatic adenocarcinoma. 3) status post laparoscopic laparotomy and resection of proximal lesion and and distal jejunum/proximal ileum under the care of Dr. Hassell Done 1 02/27/2016. 3)  Systemic chemotherapy with carboplatin for AUC of 5 and Alimta 500 MG/M2 every 3 weeks. First dose 04/04/2016. Status post 2 cycles. Last dose was given 04/21/2016 discontinued secondary to disease progression. 4) Second line immunotherapy with Ketruda 200 mg IV every 2 weeks, first dose 05/30/2016. Status post 40 cycles.  Last dose October 22, 2018.  Discontinued today secondary to disease progression.  CURRENT THERAPY: Systemic chemotherapy with docetaxel 75 mg/M2 and Cyramza 10 mg/KG every 3 weeks.  First dose November 19, 2018. Status post 1 cycle. Dose of Docetaxel  reduced to 60 mg/m2 starting on cycle #2.   INTERVAL HISTORY: Brett [name] 58 y.o. male returns to the clinic for a follow-up visit.  The patient's most recent scan from 11/12/2018 showed evidence of disease progression, particularly with abdominal lymphadenopathy. His treatment with [name] was discontinued and was switched to systemic chemotherapy with docetaxel and Cyramza.  His first dose of treatment was on 11/19/2018. The patient was hospitalized from 11/15-11/18 with a chief complaint of hypotension, colitis, and bilateral lower extremity swelling.  He has hypertension was thought to be secondary to poor oral intake and chronic diarrhea.  He was then seen by his primary care provider recently who discontinued his low-dose of lisinopril which was for renal protection for his diabetes mellitus.  For his bilateral lower extremity swelling, that was thought to be drug-induced possibly from his chemotherapy, per chart review.  Of note, a UA was performed in the emergency room which was negative for protein urea.  His albumin was 2.8.  He has been using compression stockings and his swelling has improved at this time.  The patient states that he was "sick for 13 days" after his first treatment. He states he may have vomited approximately 4 times over the course of the 13 days. He denies any fever, chills, or night sweats. He denies chest pain, cough, hemoptysis, or worsening shortness of breath. The patient unfortunately continues to smoke. He states his diarrhea/constipation have somewhat improved at this time and states he had two bowel movements today. He denies any headaches or visual changes.   Today, the patient's main complaint is related to poor oral intake and decreased appetite with associated nausea. He tries to use carnation breakfast essentials and drinks 2 ensures per day. He has lost 19 labs since 11/19. He states he  drinks less than 1 bottle of water daily.   He also states he has been  experiencing back pain that he characterizes as achiness over the last few weeks. He denies any trauma or injuries. He is prescribed percocet for pain and he has been trying to use heading pads as well. He states his pain is exacerbated by standing or sitting. The pain is localized to the lumbar region.   He experienced a rash and itching with cycle #1 docetaxel and Cyramza which was resolved with the use of Pepcid, Benadryl and decreasing the rate of infusion of Cyramza. He is here today for evaluation before starting cycle #2.     MEDICAL HISTORY: Past Medical History:  Diagnosis Date  . Barrett's esophagus 07/01/2013   Without dysplasia on biopsy 09/03/2012. Repeat EGD recommended 08/2015  . Bilateral cataracts 02/13/2017  . Carotid artery stenosis 07/01/2013   Requiring right sided stent   . Chronic pain syndrome 07/01/2013  . Closed head injury with brief loss of consciousness ([place]) 07/22/2010   Head trapped in a hydraulic device at work.  Fracture of orbital bones on right and brief loss of consciousness per report.  . 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 There was likely a strong psycho-social overlay in regard to the cognitive deficits in the form of mood disorder with psychotic features and mixed anxiety symptomatology. His primary tested cognitive deficits are in the areas of attention, executi  . Daily headache "since 07/2010"   constantly  . Degenerative joint disease of cervical spine 07/01/2013  . Dupuytren's contracture of both hands 04/08/2014  . Encounter for antineoplastic chemotherapy 10/05/2015  . Encounter for antineoplastic immunotherapy 05/24/2016  . Erectile dysfunction associated with type 2 diabetes mellitus ([place]) 07/01/2013  . Fibromyalgia 07/01/2013  . Goals of care, counseling/discussion 03/28/2016  . History of blood transfusion  11/28/2015   "suppose to get his first today" (11/28/2015)  . Hyperlipidemia LDL goal < 100 07/01/2013  . Intractable hiccups 04/11/2016  . 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 Memory changes    "memory issues" from head injury  . Moderate protein-calorie malnutrition ([place]) 11/29/2015  . Osteoarthritis of right thumb 10/21/2014  . Peripheral vascular occlusive disease ([place]) 07/01/2013   Requiring 2 arterial stents above the left knee per report  . Pneumonia ~ 2006/2007  . Post traumatic stress disorder 07/01/2013  . Primary lung adenocarcinoma ([place]) dx'd 08/2015   "right lung"  . Severe major depression with psychotic features ([place]) 04/15/2011  . Tobacco abuse 07/01/2013  . Type 2 diabetes mellitus with vascular disease ([place]) 07/01/2013   Left lower extremity and right carotid stenting    ALLERGIES:  is allergic to gabapentin; lyrica [pregabalin]; jardiance [empagliflozin]; celebrex [celecoxib]; and contrast media [iodinated diagnostic agents].  MEDICATIONS:  Current Outpatient Medications  Medication Sig Dispense Refill  . ACCU-CHEK SOFTCLIX LANCETS lancets Use to test blood glucose 1-2 times daily. Dx Code E11.59 100 each 12  . aspirin EC 81 MG tablet Take 81 mg by mouth daily.     Marland Kitchen atorvastatin (LIPITOR) 40 MG tablet Take 1 tablet (40 mg total) by mouth daily. 90 tablet 3  . Blood Glucose Monitoring Suppl (ACCU-CHEK AVIVA PLUS) w/Device KIT Use to test blood glucose 1-2 times daily. Dx Code E11.59 1 kit 0  . dexamethasone (DECADRON) 4 MG tablet 4 mg p.o. twice daily the day before, day of and  day after chemotherapy every 3 weeks. (Patient taking differently: Take 4 mg by mouth See admin instructions. Take 1 tablet. twice daily the day before, day of and day after chemotherapy every 3 weeks.) 40 tablet 1  . glipiZIDE (GLUCOTROL) 10 MG tablet Take 1 tablet (10 mg total) by mouth 2 (two) times daily before a meal. 180 tablet 3  . glucose blood test strip USE TO TEST BLOOD GLUCOSE 1 TO 2  TIMES DAILY 100 each 4  . insulin aspart (NOVOLOG) 100 UNIT/ML FlexPen Inject 1-9 units into the skin with your 2 biggest meals  based on the sliding scale provided to you. 5 pen 3  . Insulin Glargine (LANTUS) 100 UNIT/ML Solostar Pen Inject 5 Units into the skin daily. 15 mL 3  . Insulin Pen Needle (PEN NEEDLES) 32G X 4 MM MISC 1 pen by Does not apply route 2 (two) times daily. Dx Code E11.59 200 each 6  . levothyroxine (SYNTHROID) 50 MCG tablet Take 1 tablet (50 mcg total) by mouth daily before breakfast. 30 tablet 2  . oxyCODONE-acetaminophen (PERCOCET) 10-325 MG tablet Take 1-2 tablets by mouth every 6 (six) hours as needed for pain. (Patient not taking: Reported on 12/06/2018) 240 tablet 0  . oxyCODONE-acetaminophen (PERCOCET) 10-325 MG tablet Take 1 tablet by mouth every 6 (six) hours as needed for pain. 30 tablet 0  . pantoprazole (PROTONIX) 40 MG tablet TAKE 1 TABLET(40 MG) BY MOUTH DAILY (Patient taking differently: Take 40 mg by mouth daily. ) 90 tablet 1  . polyethylene glycol (MIRALAX / GLYCOLAX) 17 g packet Take 17 g by mouth daily. 30 packet 0  . predniSONE (DELTASONE) 50 MG tablet TAKE 1 TABLET BY MOUTH AT 13 HOURS, 7 HOURS, AND 1 HOUR PRIOR TO SCAN (Patient not taking: Reported on 12/06/2018) 3 tablet 0  . prochlorperazine (COMPAZINE) 10 MG tablet Take 1 tablet (10 mg total) by mouth every 6 (six) hours as needed for nausea or vomiting. 30 tablet 5  . sitaGLIPtin (JANUVIA) 100 MG tablet Take 1 tablet (100 mg total) by mouth daily. 90 tablet 3   No current facility-administered medications for this visit.   Facility-Administered Medications Ordered in Other Visits  Medication Dose Route Frequency Provider Last Rate Last Admin  . DOCEtaxel (TAXOTERE) 90 mg in sodium chloride 0.9 % 250 mL chemo infusion  60 mg/m2 (Treatment Plan Recorded) Intravenous Once Curt Bears, MD      . famotidine (PEPCID) IVPB 20 mg premix  20 mg Intravenous Once Heilingoetter, Cassandra L, PA-C 200 mL/hr  at 12/31/18 1351 20 mg at 12/31/18 1351  . heparin lock flush 100 unit/mL  500 Units Intracatheter Once PRN Curt Bears, MD      . ramucirumab [facility]) 420 mg in sodium chloride 0.9 % 208 mL chemo infusion  420 mg Intravenous Once Curt Bears, MD      . sodium chloride flush (NS) 0.9 % injection 10 mL  10 mL Intracatheter PRN Curt Bears, MD        SURGICAL HISTORY:  Past Surgical History:  Procedure Laterality Date  . CAROTID STENT Right ?2014  . COLONOSCOPY N/A 11/30/2015   Procedure: COLONOSCOPY;  Surgeon: Teena Irani, MD;  Location: [facility] ENDOSCOPY;  Service: Endoscopy;  Laterality: N/A;  . ESOPHAGOGASTRODUODENOSCOPY N/A 11/30/2015   Procedure: ESOPHAGOGASTRODUODENOSCOPY (EGD);  Surgeon: Teena Irani, MD;  Location: [facility] ENDOSCOPY;  Service: Endoscopy;  Laterality: N/A;  . ESOPHAGOGASTRODUODENOSCOPY (EGD) WITH PROPOFOL N/A 01/05/2016   Procedure: ESOPHAGOGASTRODUODENOSCOPY (EGD) WITH PROPOFOL;  Surgeon: Jenny Reichmann  Amedeo Plenty, MD;  Location: [facility] ENDOSCOPY;  Service: Endoscopy;  Laterality: N/A;  . FEMORAL ARTERY STENT Left 05/2012; ~ 2015   Archie Endo 06/04/2012; Raechel Chute report  . FRACTURE SURGERY    . GIVENS CAPSULE STUDY N/A 12/22/2015   Procedure: GIVENS CAPSULE STUDY;  Surgeon: Wonda Horner, MD;  Location: [facility] ENDOSCOPY;  Service: Endoscopy;  Laterality: N/A;  . HARDWARE REMOVAL Right 11/15/2011   Removal of deep frontozygomatic orbital hardware/notes 11/15/2011  . HERNIA REPAIR  0177   Umbilical  . LAPAROSCOPY N/A 02/27/2016   Procedure: LAPAROSCOPY, LAPAROTOMY  WITH TWO SMALL BOWEL RESECTION;  Surgeon: Johnathan Hausen, MD;  Location: [facility] ORS;  Service: General;  Laterality: N/A;  . ORIF ORBITAL FRACTURE Right 08/15/2010    caught in a hydraulic machine; open reduction internal fixation of orbital rim fracture and open reduction of zygomatic arch fracture  Archie Endo 10/13/2009  . PORTACATH PLACEMENT Left 04/04/2016   Procedure: INSERTION PORT-A-CATH LEFT CHEST;  Surgeon: Melrose Nakayama, MD;   Location: [facility];  Service: Thoracic;  Laterality: Left;  Marland Kitchen VIDEO ASSISTED THORACOSCOPY (VATS)/ LOBECTOMY Right 10/18/2015   Procedure: VIDEO ASSISTED THORACOSCOPY (VATS)/ LOBECTOMY;  Surgeon: Melrose Nakayama, MD;  Location: [facility];  Service: Thoracic;  Laterality: Right;  Marland Kitchen VIDEO BRONCHOSCOPY Bilateral 09/21/2015   Procedure: VIDEO BRONCHOSCOPY WITH FLUORO;  Surgeon: Juanito Doom, MD;  Location: [facility] ENDOSCOPY;  Service: Cardiopulmonary;  Laterality: Bilateral;    REVIEW OF SYSTEMS:   Review of Systems  Constitutional: Positive for fatigue, weight loss, and decreased appetite. Negative for chills and fever. HENT: Negative for mouth sores, nosebleeds, sore throat and trouble swallowing.   Eyes: Negative for eye problems and icterus.  Respiratory: Negative for cough, hemoptysis, shortness of breath and wheezing.   Cardiovascular: Negative for chest pain and leg swelling.  Gastrointestinal: Negative for abdominal pain, constipation, diarrhea, nausea and vomiting.  Genitourinary: Negative for bladder incontinence, difficulty urinating, dysuria, frequency and hematuria.   Musculoskeletal: Positive for lumbar back pain. Negative for gait problem, neck pain and neck stiffness.  Skin: Negative for itching and rash.  Neurological: Negative for dizziness, extremity weakness, gait problem, headaches, light-headedness and seizures.  Hematological: Negative for adenopathy. Does not bruise/bleed easily.  Psychiatric/Behavioral: Negative for confusion, depression and sleep disturbance. The patient is not nervous/anxious.     PHYSICAL EXAMINATION:  Blood pressure 101/75, pulse 97, temperature 97.8 F (36.6 C), temperature source Temporal, resp. rate 18, height 5' 7"  (1.702 m), weight 92 lb 9.6 oz (42 kg), SpO2 95 %.  ECOG PERFORMANCE STATUS: 1 - Symptomatic but completely ambulatory  Physical Exam  Constitutional: Oriented to person, place, and time and cachetic apeparing male  and in no distress.   HENT:  Head: Normocephalic and atraumatic.  Mouth/Throat: Oropharynx is clear and moist. No oropharyngeal exudate.  Eyes: Conjunctivae are normal. Right eye exhibits no discharge. Left eye exhibits no discharge. No scleral icterus.  Neck: Normal range of motion. Neck supple.  Cardiovascular: Normal rate, regular rhythm, normal heart sounds and intact distal pulses.   Pulmonary/Chest: Effort normal and breath sounds normal. No respiratory distress. No wheezes. No rales.  Abdominal: Soft. Bowel sounds are normal. Exhibits no distension and no mass. There is no tenderness.  Musculoskeletal: Normal range of motion. Exhibits no edema.  Lymphadenopathy:    No cervical adenopathy.  Neurological: Alert and oriented to person, place, and time. Exhibits normal muscle tone. Gait normal. Coordination normal.  Skin: Skin is warm and dry. No rash noted. Not diaphoretic. No erythema. No  pallor.  Psychiatric: Mood, memory and judgment normal.  Vitals reviewed.  LABORATORY DATA: Lab Results  Component Value Date   WBC 3.9 (L) 12/31/2018   HGB 14.0 12/31/2018   HCT 42.5 12/31/2018   MCV 96.2 12/31/2018   PLT 270 12/31/2018      Chemistry      Component Value Date/Time   NA 135 12/31/2018 1112   NA 140 01/23/2017 0910   K 4.6 12/31/2018 1112   K 4.5 01/23/2017 0910   CL 97 (L) 12/31/2018 1112   CO2 30 12/31/2018 1112   CO2 28 01/23/2017 0910   BUN 8 12/31/2018 1112   BUN 8.1 01/23/2017 0910   CREATININE 0.73 12/31/2018 1112   CREATININE 0.8 01/23/2017 0910   GLU 398 (H) 08/08/2016 1257      Component Value Date/Time   CALCIUM 9.1 12/31/2018 1112   CALCIUM 9.6 01/23/2017 0910   ALKPHOS 122 12/31/2018 1112   ALKPHOS 113 01/23/2017 0910   AST 25 12/31/2018 1112   AST 11 01/23/2017 0910   ALT 19 12/31/2018 1112   ALT 12 01/23/2017 0910   BILITOT 0.6 12/31/2018 1112   BILITOT 0.40 01/23/2017 0910       RADIOGRAPHIC STUDIES:  CT ABDOMEN PELVIS W CONTRAST  Result Date:  12/08/2018 CLINICAL DATA:  Abd pain, gastroenteritis or colitis suspected EXAM: CT ABDOMEN AND PELVIS WITH CONTRAST TECHNIQUE: Multidetector CT imaging of the abdomen and pelvis was performed using the standard protocol following bolus administration of intravenous contrast. CONTRAST:  172m OMNIPAQUE IOHEXOL 300 MG/ML  SOLN COMPARISON:  Multiple prior exams. Most recent CT 11/10/2018 FINDINGS: Lower chest: Trace left pleural effusion with minimal adjacent atelectasis. Right lung bases clear. Hepatobiliary: Advanced hepatic steatosis. No focal hepatic abnormality. Small calcified gallstones without discrete pericholecystic inflammation. No biliary dilatation. Pancreas: No ductal dilatation or inflammation. Spleen: Normal in size without focal abnormality. Adrenals/Urinary Tract: Normal adrenal glands. No hydronephrosis or perinephric edema. Homogeneous renal enhancement with symmetric excretion on delayed phase imaging. Urinary bladder is physiologically distended without wall thickening. Stomach/Bowel: Stomach is unremarkable. Administered enteric contrast reaches distal small bowel. No small bowel obstruction or inflammatory change. Enteric chain sutures noted in the mid small bowel. Air-filled appendix without periappendiceal inflammation. Large volume of stool throughout the entire colon. Improved hyperenhancing inflamed appearance of the distal colon and rectum. Rectum is decompressed with mild rectal wall thickening. No perirectal or perianal collection. Vascular/Lymphatic: Advanced aortic atherosclerosis. No aneurysm. The portal vein is patent. Multifocal retroperitoneal adenopathy with centrally hypodense/necrotic nodes, retroperitoneal adenopathy has slightly progressed from prior exam. For example left periaortic node at the level of the left renal vein series 3, image 32 measures 3.1 x 2.6 cm, previously 2.5 x 2.3 cm. Multiple large mesenteric nodes, many of which are centrally hypodense/necrotic. Index  nodule in small bowel mesentery measures 3.0 x 1.8 cm, series 3, image 37, previously 3.8 x 2.3 cm. Small retrocrural nodes are unchanged. Reproductive: Heterogeneous prostate gland with right prostatic calcification. Other: Increased volume of abdominopelvic ascites from prior exam. Ascites ileum is small to moderate. No free air or intra-abdominal abscess. Musculoskeletal: There are no acute or suspicious osseous abnormalities. IMPRESSION: 1. Large volume of stool throughout the entire colon. Improved hyperenhancing inflamed appearance of the distal colon and rectum from prior exam, mild rectal wall thickening persists. 2. Increased volume of abdominopelvic ascites from prior exam, small to moderate. 3. Progressive retroperitoneal adenopathy. Have mesenteric adenopathy has slightly improved. 4. Advanced hepatic steatosis. Cholelithiasis without gallbladder inflammation. 5. Trace  left pleural effusion with adjacent atelectasis. Aortic Atherosclerosis (ICD10-I70.0). Electronically Signed   By: Keith Rake M.D.   On: 12/08/2018 01:12   DG Chest Port 1 View  Result Date: 12/06/2018 CLINICAL DATA:  History of lung carcinoma with peripheral edema EXAM: PORTABLE CHEST 1 VIEW COMPARISON:  11/10/2018 FINDINGS: Cardiac shadow is within normal limits. Aortic calcifications are again noted. Left chest wall port is seen in satisfactory position. Postsurgical changes are noted on the right consistent with the patient's given clinical history. No focal infiltrate or sizable effusion is seen. No bony abnormality is noted. IMPRESSION: No acute abnormality noted. Postoperative changes are seen. Electronically Signed   By: Inez Catalina M.D.   On: 12/06/2018 02:44   ECHOCARDIOGRAM COMPLETE  Result Date: 12/06/2018   ECHOCARDIOGRAM REPORT   Patient Name:   Brett [name] Date of Exam: 12/06/2018 Medical Rec #:  [id number]     Height:       67.0 in Accession #:    [id number]    Weight:       100.0 lb Date of Birth:   08-26-1960     BSA:          1.51 m Patient Age:    20 years      BP:           112/70 mmHg Patient Gender: M             HR:           76 bpm. Exam Location:  Inpatient Procedure: 2D Echo and Intracardiac Opacification Agent                       STAT ECHO Reported to: Dr Fransico Him on 12/06/2018 9:13:00 AM. Indications:    Dyspnea 786.09/R06.00  History:        Patient has prior history of Echocardiogram examinations, most                 recent 12/08/2015. Risk Factors:Diabetes, Dyslipidemia and                 Former Smoker.  Sonographer:    Clayton Lefort RDCS (AE) Referring Phys: [id number] Velna Ochs  Sonographer Comments: Suboptimal parasternal window, suboptimal subcostal window and Technically difficult study due to poor echo windows. IMPRESSIONS  1. Left ventricular ejection fraction, by visual estimation, is 60 to 65%. The left ventricle has normal function. There is no left ventricular hypertrophy.  2. Definity contrast agent was given IV to delineate the left ventricular endocardial borders.  3. Global right ventricle has normal systolic function.The right ventricular size is normal. No increase in right ventricular wall thickness.  4. Left atrial size was normal.  5. Right atrial size was normal.  6. The mitral valve is normal in structure. Trace mitral valve regurgitation. No evidence of mitral stenosis.  7. The tricuspid valve is normal in structure. Tricuspid valve regurgitation is not demonstrated.  8. The aortic valve is normal in structure. Aortic valve regurgitation is not visualized. No evidence of aortic valve sclerosis or stenosis.  9. The pulmonic valve was normal in structure. Pulmonic valve regurgitation is not visualized. 10. Mildly elevated pulmonary artery systolic pressure. 11. The inferior vena cava is normal in size with greater than 50% respiratory variability, suggesting right atrial pressure of 3 mmHg. FINDINGS  Left Ventricle: Left ventricular ejection fraction, by visual  estimation, is 60 to 65%. The left ventricle has normal function. Definity contrast agent was  given IV to delineate the left ventricular endocardial borders. There is no left ventricular hypertrophy. Left ventricular diastolic parameters were normal. Normal left atrial pressure. Right Ventricle: The right ventricular size is normal. No increase in right ventricular wall thickness. Global RV systolic function is has normal systolic function. The tricuspid regurgitant velocity is 2.46 m/s, and with an assumed right atrial pressure  of 8 mmHg, the estimated right ventricular systolic pressure is mildly elevated at 32.2 mmHg. Left Atrium: Left atrial size was normal in size. Right Atrium: Right atrial size was normal in size Pericardium: There is no evidence of pericardial effusion. Mitral Valve: The mitral valve is normal in structure. No evidence of mitral valve stenosis by observation. Trace mitral valve regurgitation. Tricuspid Valve: The tricuspid valve is normal in structure. Tricuspid valve regurgitation is not demonstrated. Aortic Valve: The aortic valve is normal in structure. Aortic valve regurgitation is not visualized. The aortic valve is structurally normal, with no evidence of sclerosis or stenosis. Pulmonic Valve: The pulmonic valve was normal in structure. Pulmonic valve regurgitation is not visualized. Aorta: The aortic root, ascending aorta and aortic arch are all structurally normal, with no evidence of dilitation or obstruction. Venous: The inferior vena cava is normal in size with greater than 50% respiratory variability, suggesting right atrial pressure of 3 mmHg. IAS/Shunts: No atrial level shunt detected by color flow Doppler. No ventricular septal defect is seen or detected. There is no evidence of an atrial septal defect.  LEFT VENTRICLE PLAX 2D LVIDd:         4.20 cm  Diastology LVIDs:         2.90 cm  LV e' lateral:   12.40 cm/s LV PW:         1.10 cm  LV E/e' lateral: 7.8 LV IVS:        0.90  cm  LV e' medial:    10.80 cm/s LVOT diam:     1.80 cm  LV E/e' medial:  9.0 LV SV:         46 ml LV SV Index:   32.06 LVOT Area:     2.54 cm  RIGHT VENTRICLE            IVC RV Basal diam:  2.20 cm    IVC diam: 1.50 cm RV S prime:     7.83 cm/s TAPSE (M-mode): 2.0 cm LEFT ATRIUM             Index       RIGHT ATRIUM          Index LA diam:        3.00 cm 1.99 cm/m  RA Area:     9.41 cm LA Vol (A2C):   42.7 ml 28.35 ml/m RA Volume:   17.70 ml 11.75 ml/m LA Vol (A4C):   37.6 ml 24.96 ml/m LA Biplane Vol: 42.1 ml 27.95 ml/m  AORTIC VALVE LVOT Vmax:   73.80 cm/s LVOT Vmean:  45.600 cm/s LVOT VTI:    0.150 m  AORTA Ao Root diam: 3.10 cm MITRAL VALVE                        TRICUSPID VALVE MV Area (PHT): 4.49 cm             TR Peak grad:   24.2 mmHg MV PHT:        49.01 msec           TR Vmax:  246.00 cm/s MV Decel Time: 169 msec MV E velocity: 97.30 cm/s 103 cm/s  SHUNTS MV A velocity: 55.30 cm/s 70.3 cm/s Systemic VTI:  0.15 m MV E/A ratio:  1.76       1.5       Systemic Diam: 1.80 cm  Candee Furbish MD Electronically signed by Candee Furbish MD Signature Date/Time: 12/06/2018/10:51:02 AM    Final      ASSESSMENT/PLAN:  This is a very pleasant 58 year old Caucasian male with metastatic non-small cell lung cancer, adenocarcinoma. He presented with a right upper lobe lung nodule.He was initially diagnosed in September 2017.  He is status post a right upper lobectomy with lymph node dissection. Unfortunately he was found to have metastatic disease in the jejunum and terminal ileum. He underwent a surgical resection of the proximal and distal jejunum as well as proximal ileum. The final pathology was consistent with high-grade carcinoma.  The patient then underwent systemic chemotherapy with carboplatin and Alimta for 2 cycles. This was discontinued secondary to disease progression. He is status post 40 cycles of second line immunotherapy with Keytruda 200 mg IV every 3 weeks.   The patient is  currently on docetaxel 75 mg/m and Cyramza 10 mg/kg IV every 3 weeks with Neulasta support.  He is status post 1 cycle.  The  patient was hospitalized following cycle #1 for the chief complaint of hypotension, bilateral lower extremity swelling, and colitis.  The patient's swelling and bowel habits are improved today, however, he still continues to lose weight and has poor oral intake.   The patient was seen with Dr. Julien Nordmann today.  Labs were reviewed.  Dr. Julien Nordmann recommends reducing the patient's dose of docetaxel to 60 mg/m2. The patient is interested in trying another cycle of chemotherapy today. We will order the patient to have a restaging CT scan prior to his next cycle of treatment. If the scan does not show benefit with his current chemotherapy treatment, then we will likely offer/discuss palliative care/hospice at his next visit.   We will see patient back for follow-up visit in 3 weeks for evaluation and to review his scan results before starting cycle #3.  He will receive 1L of normal saline while in infusion today. He will continue to try to increase his oral intake of food and will continue drinking his nutritional supplements.   The patient's diabetes medication is managed by his PCP. This was recently adjusted by PCP  The patient unfortunately continues to smoke. He is not ready to quit at this time.  He will continue on his current pain management regimen with percocet.   The patient was advised to call immediately if he has any concerning symptoms in the interval. The patient voices understanding of current disease status and treatment options and is in agreement with the current care plan. All questions were answered. The patient knows to call the clinic with any problems, questions or concerns. We can certainly see the patient much sooner if necessary  Orders Placed This Encounter  Procedures  . CT Chest W Contrast    Standing Status:   Future    Standing Expiration Date:    12/31/2019    Order Specific Question:   ** REASON FOR EXAM (FREE TEXT)    Answer:   Restaging Lung Cancer    Order Specific Question:   If indicated for the ordered procedure, I authorize the administration of contrast media per Radiology protocol    Answer:   Yes  Order Specific Question:   Preferred imaging location?    Answer:   [facility]    Order Specific Question:   Radiology Contrast Protocol - do NOT remove file path    Answer:   \\[email or web address]  . CT Abdomen Pelvis W Contrast    Standing Status:   Future    Standing Expiration Date:   12/31/2019    Order Specific Question:   ** REASON FOR EXAM (FREE TEXT)    Answer:   Restaging Lung Cancer    Order Specific Question:   If indicated for the ordered procedure, I authorize the administration of contrast media per Radiology protocol    Answer:   Yes    Order Specific Question:   Preferred imaging location?    Answer:   [facility]    Order Specific Question:   Is Oral Contrast requested for this exam?    Answer:   Yes, Per Radiology protocol    Order Specific Question:   Radiology Contrast Protocol - do NOT remove file path    Answer:   \\[email or web address]     [place], PA-C 12/31/18  ADDENDUM: Hematology/Oncology Attending: I had a face-to-face encounter with the patient today.  I recommended his care plan.  This is a very pleasant 58 years old white male with metastatic non-small cell lung cancer, adenocarcinoma/high-grade neuroendocrine carcinoma status post several chemotherapy regimens including 2 years treatment with single agent Keytruda discontinued secondary to disease progression. The patient is currently undergoing treatment with systemic chemotherapy with docetaxel and Cyramza status post 1 cycle.  He tolerated the treatment well except for increasing fatigue and weight loss. We encouraged the patient to increase his nutrition  supplements and eat more meals. I will reduce his dose of docetaxel to 60 mg/M2 starting from cycle #2 because of the significant decline in his condition. He will come back for follow-up visit in 3 weeks for evaluation with repeat CT scan of the chest, abdomen pelvis for restaging of his disease. The patient was advised to call immediately if he has any concerning symptoms in the interval.  Disclaimer: This note was dictated with voice recognition software. Similar sounding words can inadvertently be transcribed and may be missed upon review. Eilleen Kempf, MD 12/31/18

## 2018-12-31 ENCOUNTER — Inpatient Hospital Stay (HOSPITAL_BASED_OUTPATIENT_CLINIC_OR_DEPARTMENT_OTHER): Payer: Medicare HMO | Admitting: Physician Assistant

## 2018-12-31 ENCOUNTER — Encounter: Payer: Self-pay | Admitting: Physician Assistant

## 2018-12-31 ENCOUNTER — Other Ambulatory Visit: Payer: Medicare HMO

## 2018-12-31 ENCOUNTER — Inpatient Hospital Stay: Payer: Medicare HMO

## 2018-12-31 ENCOUNTER — Inpatient Hospital Stay: Payer: Medicare HMO | Attending: Internal Medicine

## 2018-12-31 ENCOUNTER — Other Ambulatory Visit: Payer: Self-pay

## 2018-12-31 ENCOUNTER — Inpatient Hospital Stay: Payer: Medicare HMO | Admitting: Medical

## 2018-12-31 ENCOUNTER — Ambulatory Visit: Payer: Medicare HMO

## 2018-12-31 VITALS — BP 101/75 | HR 97 | Temp 97.8°F | Resp 18 | Ht 67.0 in | Wt 92.6 lb

## 2018-12-31 VITALS — BP 117/77 | HR 65 | Resp 16

## 2018-12-31 DIAGNOSIS — E440 Moderate protein-calorie malnutrition: Secondary | ICD-10-CM | POA: Diagnosis not present

## 2018-12-31 DIAGNOSIS — C171 Malignant neoplasm of jejunum: Secondary | ICD-10-CM

## 2018-12-31 DIAGNOSIS — C3491 Malignant neoplasm of unspecified part of right bronchus or lung: Secondary | ICD-10-CM | POA: Diagnosis not present

## 2018-12-31 DIAGNOSIS — Z5112 Encounter for antineoplastic immunotherapy: Secondary | ICD-10-CM | POA: Diagnosis not present

## 2018-12-31 DIAGNOSIS — I959 Hypotension, unspecified: Secondary | ICD-10-CM | POA: Diagnosis not present

## 2018-12-31 DIAGNOSIS — C3411 Malignant neoplasm of upper lobe, right bronchus or lung: Secondary | ICD-10-CM | POA: Insufficient documentation

## 2018-12-31 DIAGNOSIS — Z5111 Encounter for antineoplastic chemotherapy: Secondary | ICD-10-CM | POA: Insufficient documentation

## 2018-12-31 DIAGNOSIS — T8090XA Unspecified complication following infusion and therapeutic injection, initial encounter: Secondary | ICD-10-CM

## 2018-12-31 DIAGNOSIS — Z79899 Other long term (current) drug therapy: Secondary | ICD-10-CM | POA: Insufficient documentation

## 2018-12-31 DIAGNOSIS — Z95828 Presence of other vascular implants and grafts: Secondary | ICD-10-CM

## 2018-12-31 DIAGNOSIS — Z452 Encounter for adjustment and management of vascular access device: Secondary | ICD-10-CM | POA: Diagnosis not present

## 2018-12-31 DIAGNOSIS — F17210 Nicotine dependence, cigarettes, uncomplicated: Secondary | ICD-10-CM | POA: Insufficient documentation

## 2018-12-31 DIAGNOSIS — Z720 Tobacco use: Secondary | ICD-10-CM

## 2018-12-31 DIAGNOSIS — Z5189 Encounter for other specified aftercare: Secondary | ICD-10-CM | POA: Insufficient documentation

## 2018-12-31 LAB — CMP (CANCER CENTER ONLY)
ALT: 19 U/L (ref 0–44)
AST: 25 U/L (ref 15–41)
Albumin: 3.5 g/dL (ref 3.5–5.0)
Alkaline Phosphatase: 122 U/L (ref 38–126)
Anion gap: 8 (ref 5–15)
BUN: 8 mg/dL (ref 6–20)
CO2: 30 mmol/L (ref 22–32)
Calcium: 9.1 mg/dL (ref 8.9–10.3)
Chloride: 97 mmol/L — ABNORMAL LOW (ref 98–111)
Creatinine: 0.73 mg/dL (ref 0.61–1.24)
GFR, Est AFR Am: >60 mL/min (ref >60)
GFR, Estimated: >60 mL/min (ref >60)
Glucose, Bld: 282 mg/dL — ABNORMAL HIGH (ref 70–99)
Potassium: 4.6 mmol/L (ref 3.5–5.1)
Sodium: 135 mmol/L (ref 135–145)
Total Bilirubin: 0.6 mg/dL (ref 0.3–1.2)
Total Protein: 6.5 g/dL (ref 6.5–8.1)

## 2018-12-31 LAB — CBC WITH DIFFERENTIAL (CANCER CENTER ONLY)
Abs Immature Granulocytes: 0.03 10*3/uL (ref 0.00–0.07)
Basophils Absolute: 0 10*3/uL (ref 0.0–0.1)
Basophils Relative: 0 %
Eosinophils Absolute: 0 10*3/uL (ref 0.0–0.5)
Eosinophils Relative: 0 %
HCT: 42.5 % (ref 39.0–52.0)
Hemoglobin: 14 g/dL (ref 13.0–17.0)
Immature Granulocytes: 1 %
Lymphocytes Relative: 12 %
Lymphs Abs: 0.5 10*3/uL — ABNORMAL LOW (ref 0.7–4.0)
MCH: 31.7 pg (ref 26.0–34.0)
MCHC: 32.9 g/dL (ref 30.0–36.0)
MCV: 96.2 fL (ref 80.0–100.0)
Monocytes Absolute: 0.1 10*3/uL (ref 0.1–1.0)
Monocytes Relative: 4 %
Neutro Abs: 3.3 10*3/uL (ref 1.7–7.7)
Neutrophils Relative %: 83 %
Platelet Count: 270 10*3/uL (ref 150–400)
RBC: 4.42 MIL/uL (ref 4.22–5.81)
RDW: 14.4 % (ref 11.5–15.5)
WBC Count: 3.9 10*3/uL — ABNORMAL LOW (ref 4.0–10.5)
nRBC: 0 % (ref 0.0–0.2)

## 2018-12-31 LAB — TSH: TSH: 1.321 u[IU]/mL (ref 0.320–4.118)

## 2018-12-31 LAB — TOTAL PROTEIN, URINE DIPSTICK: Protein, ur: 30 mg/dL — AB

## 2018-12-31 MED ORDER — FAMOTIDINE IN NACL 20-0.9 MG/50ML-% IV SOLN
INTRAVENOUS | Status: AC
  Filled 2018-12-31: qty 50

## 2018-12-31 MED ORDER — DIPHENHYDRAMINE HCL 50 MG/ML IJ SOLN
50.0000 mg | Freq: Once | INTRAMUSCULAR | Status: AC
  Administered 2018-12-31: 50 mg via INTRAVENOUS

## 2018-12-31 MED ORDER — ALTEPLASE 2 MG IJ SOLR
2.0000 mg | Freq: Once | INTRAMUSCULAR | Status: AC | PRN
  Administered 2018-12-31: 2 mg
  Filled 2018-12-31: qty 2

## 2018-12-31 MED ORDER — ACETAMINOPHEN 325 MG PO TABS
ORAL_TABLET | ORAL | Status: AC
  Filled 2018-12-31: qty 2

## 2018-12-31 MED ORDER — SODIUM CHLORIDE 0.9 % IV SOLN
420.0000 mg | Freq: Once | INTRAVENOUS | Status: AC
  Administered 2018-12-31: 420 mg via INTRAVENOUS
  Filled 2018-12-31: qty 42

## 2018-12-31 MED ORDER — DIPHENHYDRAMINE HCL 50 MG/ML IJ SOLN
INTRAMUSCULAR | Status: AC
  Filled 2018-12-31: qty 1

## 2018-12-31 MED ORDER — SODIUM CHLORIDE 0.9 % IV SOLN
Freq: Once | INTRAVENOUS | Status: AC
  Administered 2018-12-31: 13:00:00 via INTRAVENOUS
  Filled 2018-12-31: qty 250

## 2018-12-31 MED ORDER — ALTEPLASE 2 MG IJ SOLR
INTRAMUSCULAR | Status: AC
  Filled 2018-12-31: qty 2

## 2018-12-31 MED ORDER — ACETAMINOPHEN 325 MG PO TABS
650.0000 mg | ORAL_TABLET | Freq: Once | ORAL | Status: AC
  Administered 2018-12-31: 650 mg via ORAL

## 2018-12-31 MED ORDER — SODIUM CHLORIDE 0.9% FLUSH
10.0000 mL | INTRAVENOUS | Status: DC | PRN
  Administered 2018-12-31: 10 mL
  Filled 2018-12-31: qty 10

## 2018-12-31 MED ORDER — DEXAMETHASONE SODIUM PHOSPHATE 10 MG/ML IJ SOLN
10.0000 mg | Freq: Once | INTRAMUSCULAR | Status: AC
  Administered 2018-12-31: 10 mg via INTRAVENOUS

## 2018-12-31 MED ORDER — HEPARIN SOD (PORK) LOCK FLUSH 100 UNIT/ML IV SOLN
500.0000 [IU] | Freq: Once | INTRAVENOUS | Status: AC | PRN
  Administered 2018-12-31: 500 [IU]
  Filled 2018-12-31: qty 5

## 2018-12-31 MED ORDER — DEXAMETHASONE SODIUM PHOSPHATE 10 MG/ML IJ SOLN
INTRAMUSCULAR | Status: AC
  Filled 2018-12-31: qty 1

## 2018-12-31 MED ORDER — FAMOTIDINE IN NACL 20-0.9 MG/50ML-% IV SOLN
20.0000 mg | Freq: Once | INTRAVENOUS | Status: AC
  Administered 2018-12-31: 20 mg via INTRAVENOUS

## 2018-12-31 MED ORDER — DIPHENHYDRAMINE HCL 50 MG/ML IJ SOLN
50.0000 mg | Freq: Once | INTRAMUSCULAR | Status: AC | PRN
  Administered 2018-12-31: 25 mg via INTRAVENOUS

## 2018-12-31 MED ORDER — SODIUM CHLORIDE 0.9 % IV SOLN
60.0000 mg/m2 | Freq: Once | INTRAVENOUS | Status: AC
  Administered 2018-12-31: 90 mg via INTRAVENOUS
  Filled 2018-12-31: qty 9

## 2018-12-31 MED ORDER — DIPHENHYDRAMINE HCL 25 MG PO CAPS
ORAL_CAPSULE | ORAL | Status: AC
  Filled 2018-12-31: qty 2

## 2018-12-31 NOTE — Progress Notes (Signed)
Ok to premed without urine results per C. Heilingoetter, PA   Immediately post cyramza, pt c/o generalized itching.  No hives visible.  Sandi Mealy, PA notified.  Decision made to give an additional 25mg  of benadryl IV.  VSS.  No other complaints.  Will continue to monitor through Taxotere infusion.

## 2018-12-31 NOTE — Patient Instructions (Signed)

## 2018-12-31 NOTE — Patient Instructions (Signed)
[facility] Discharge Instructions for Patients Receiving Chemotherapy  Today you received the following chemotherapy agents Ramucirumab (CYRAMZA) & Docetaxel (TAXOTERE).  To help prevent nausea and vomiting after your treatment, we encourage you to take your nausea medication as prescribed.   If you develop nausea and vomiting that is not controlled by your nausea medication, call the clinic.   BELOW ARE SYMPTOMS THAT SHOULD BE REPORTED IMMEDIATELY:  *FEVER GREATER THAN 100.5 F  *CHILLS WITH OR WITHOUT FEVER  NAUSEA AND VOMITING THAT IS NOT CONTROLLED WITH YOUR NAUSEA MEDICATION  *UNUSUAL SHORTNESS OF BREATH  *UNUSUAL BRUISING OR BLEEDING  TENDERNESS IN MOUTH AND THROAT WITH OR WITHOUT PRESENCE OF ULCERS  *URINARY PROBLEMS  *BOWEL PROBLEMS  UNUSUAL RASH Items with * indicate a potential emergency and should be followed up as soon as possible.  Feel free to call the clinic should you have any questions or concerns. The clinic phone number is [phone].  Please show the [place] at check-in to the Emergency Department and triage nurse.  Ramucirumab injection What is this medicine? RAMUCIRUMAB (ra mue SIR ue mab) is a monoclonal antibody. It is used to treat stomach cancer, colorectal cancer, liver cancer, and lung cancer. This medicine may be used for other purposes; ask your health care provider or pharmacist if you have questions. COMMON BRAND NAME(S): Cyramza What should I tell my health care provider before I take this medicine? They need to know if you have any of these conditions:  bleeding disorders  blood clots  heart disease, including heart failure, heart attack, or chest pain (angina)  high blood pressure  infection (especially a virus infection such as chickenpox, cold sores, or herpes)  protein in your urine  recent or planning to have surgery  stroke  an unusual or allergic reaction to ramucirumab, other medicines,  foods, dyes, or preservatives  pregnant or trying to get pregnant  breast-feeding How should I use this medicine? This medicine is for infusion into a vein. It is given by a health care professional in a hospital or clinic setting. Talk to your pediatrician regarding the use of this medicine in children. Special care may be needed. Overdosage: If you think you have taken too much of this medicine contact a poison control center or emergency room at once. NOTE: This medicine is only for you. Do not share this medicine with others. What if I miss a dose? It is important not to miss your dose. Call your doctor or health care professional if you are unable to keep an appointment. What may interact with this medicine? Interactions have not been studied. This list may not describe all possible interactions. Give your health care provider a list of all the medicines, herbs, non-prescription drugs, or dietary supplements you use. Also tell them if you smoke, drink alcohol, or use illegal drugs. Some items may interact with your medicine. What should I watch for while using this medicine? Your condition will be monitored carefully while you are receiving this medicine. You will need to to check your blood pressure and have your blood and urine tested while you are taking this medicine. Your condition will be monitored carefully while you are receiving this medicine. This medicine may increase your risk to bruise or bleed. Call your doctor or health care professional if you notice any unusual bleeding. This medicine may rarely cause 'gastrointestinal perforation' (holes in the stomach, intestines or colon), a serious side effect requiring surgery to repair. This medicine  should be started at least 2 weeks following major surgery and the site of the surgery should be totally healed. Check with your doctor before scheduling dental work or surgery while you are receiving this treatment. Talk to your doctor if  you have recently had surgery or if you have a wound that has not healed. Do not become pregnant while taking this medicine or for 3 months after stopping it. Women should inform their doctor if they wish to become pregnant or think they might be pregnant. There is a potential for serious side effects to an unborn child. Talk to your health care professional or pharmacist for more information. Do not breast-feed an infant while taking this medicine or for 2 months after stopping it. This medicine may interfere with the ability to have a child. Talk with your doctor or health care professional if you are concerned about your fertility. What side effects may I notice from receiving this medicine? Side effects that you should report to your doctor or health care professional as soon as possible:  allergic reactions like skin rash, itching or hives, breathing problems, swelling of the face, lips, or tongue  signs of infection - fever or chills, cough, sore throat  chest pain or chest tightness  confusion  dizziness  feeling faint or lightheaded, falls  severe abdominal pain  severe nausea, vomiting  signs and symptoms of bleeding such as bloody or black, tarry stools; red or dark-brown urine; spitting up blood or brown material that looks like coffee grounds; red spots on the skin; unusual bruising or bleeding from the eye, gums, or nose  signs and symptoms of a blood clot such as breathing problems; changes in vision; chest pain; severe, sudden headache; pain, swelling, warmth in the leg; trouble speaking; sudden numbness or weakness of the face, arm or leg  symptoms of a stroke: change in mental awareness, inability to talk or move one side of the body  trouble walking, dizziness, loss of balance or coordination Side effects that usually do not require medical attention (report to your doctor or health care professional if they continue or are bothersome):  cold, clammy  skin  constipation  diarrhea  headache  nausea, vomiting  stomach pain  unusually slow heartbeat  unusually weak or tired This list may not describe all possible side effects. Call your doctor for medical advice about side effects. You may report side effects to FDA at 1-800-FDA-1088. Where should I keep my medicine? This drug is given in a hospital or clinic and will not be stored at home. NOTE: This sheet is a summary. It may not cover all possible information. If you have questions about this medicine, talk to your doctor, pharmacist, or health care provider.  2020 Elsevier/Gold Standard (2018-03-16 11:44:01)  Docetaxel injection What is this medicine? DOCETAXEL (doe se TAX el) is a chemotherapy drug. It targets fast dividing cells, like cancer cells, and causes these cells to die. This medicine is used to treat many types of cancers like breast cancer, certain stomach cancers, head and neck cancer, lung cancer, and prostate cancer. This medicine may be used for other purposes; ask your health care provider or pharmacist if you have questions. COMMON BRAND NAME(S): Docefrez, Taxotere What should I tell my health care provider before I take this medicine? They need to know if you have any of these conditions:  infection (especially a virus infection such as chickenpox, cold sores, or herpes)  liver disease  low blood counts, like  low white cell, platelet, or red cell counts  an unusual or allergic reaction to docetaxel, polysorbate 80, other chemotherapy agents, other medicines, foods, dyes, or preservatives  pregnant or trying to get pregnant  breast-feeding How should I use this medicine? This drug is given as an infusion into a vein. It is administered in a hospital or clinic by a specially trained health care professional. Talk to your pediatrician regarding the use of this medicine in children. Special care may be needed. Overdosage: If you think you have taken too  much of this medicine contact a poison control center or emergency room at once. NOTE: This medicine is only for you. Do not share this medicine with others. What if I miss a dose? It is important not to miss your dose. Call your doctor or health care professional if you are unable to keep an appointment. What may interact with this medicine?  aprepitant  certain antibiotics like erythromycin or clarithromycin  certain antivirals for HIV or hepatitis  certain medicines for fungal infections like fluconazole, itraconazole, ketoconazole, posaconazole, or voriconazole  cimetidine  ciprofloxacin  conivaptan  cyclosporine  dronedarone  fluvoxamine  grapefruit juice  imatinib  verapamil This list may not describe all possible interactions. Give your health care provider a list of all the medicines, herbs, non-prescription drugs, or dietary supplements you use. Also tell them if you smoke, drink alcohol, or use illegal drugs. Some items may interact with your medicine. What should I watch for while using this medicine? Your condition will be monitored carefully while you are receiving this medicine. You will need important blood work done while you are taking this medicine. Call your doctor or health care professional for advice if you get a fever, chills or sore throat, or other symptoms of a cold or flu. Do not treat yourself. This drug decreases your body's ability to fight infections. Try to avoid being around people who are sick. Some products may contain alcohol. Ask your health care professional if this medicine contains alcohol. Be sure to tell all health care professionals you are taking this medicine. Certain medicines, like metronidazole and disulfiram, can cause an unpleasant reaction when taken with alcohol. The reaction includes flushing, headache, nausea, vomiting, sweating, and increased thirst. The reaction can last from 30 minutes to several hours. You may get drowsy or  dizzy. Do not drive, use machinery, or do anything that needs mental alertness until you know how this medicine affects you. Do not stand or sit up quickly, especially if you are an older patient. This reduces the risk of dizzy or fainting spells. Alcohol may interfere with the effect of this medicine. Talk to your health care professional about your risk of cancer. You may be more at risk for certain types of cancer if you take this medicine. Do not become pregnant while taking this medicine or for 6 months after stopping it. Women should inform their doctor if they wish to become pregnant or think they might be pregnant. There is a potential for serious side effects to an unborn child. Talk to your health care professional or pharmacist for more information. Do not breast-feed an infant while taking this medicine or for 1 to 2 weeks after stopping it. Males who get this medicine must use a condom during sex with females who can get pregnant. If you get a woman pregnant, the baby could have birth defects. The baby could die before they are born. You will need to continue wearing a condom  for 3 months after stopping the medicine. Tell your health care provider right away if your partner becomes pregnant while you are taking this medicine. This may interfere with the ability to father a child. You should talk to your doctor or health care professional if you are concerned about your fertility. What side effects may I notice from receiving this medicine? Side effects that you should report to your doctor or health care professional as soon as possible:  allergic reactions like skin rash, itching or hives, swelling of the face, lips, or tongue  blurred vision  breathing problems  changes in vision  low blood counts - This drug may decrease the number of white blood cells, red blood cells and platelets. You may be at increased risk for infections and bleeding.  nausea and vomiting  pain, redness or  irritation at site where injected  pain, tingling, numbness in the hands or feet  redness, blistering, peeling, or loosening of the skin, including inside the mouth  signs of decreased platelets or bleeding - bruising, pinpoint red spots on the skin, black, tarry stools, nosebleeds  signs of decreased red blood cells - unusually weak or tired, fainting spells, lightheadedness  signs of infection - fever or chills, cough, sore throat, pain or difficulty passing urine  swelling of the ankle, feet, hands Side effects that usually do not require medical attention (report to your doctor or health care professional if they continue or are bothersome):  constipation  diarrhea  fingernail or toenail changes  hair loss  loss of appetite  mouth sores  muscle pain This list may not describe all possible side effects. Call your doctor for medical advice about side effects. You may report side effects to FDA at 1-800-FDA-1088. Where should I keep my medicine? This drug is given in a hospital or clinic and will not be stored at home. NOTE: This sheet is a summary. It may not cover all possible information. If you have questions about this medicine, talk to your doctor, pharmacist, or health care provider.  2020 Elsevier/Gold Standard (2018-03-13 12:23:11)  Coronavirus (COVID-19) Are you at risk?  Are you at risk for the Coronavirus (COVID-19)?  To be considered HIGH RISK for Coronavirus (COVID-19), you have to meet the following criteria:  . Traveled to [place], [place], [place], [place] or [place]; or in the [place] to [place], [place], [place], or [place]; and have fever, cough, and shortness of breath within the last 2 weeks of travel OR . Been in close contact with a person diagnosed with COVID-19 within the last 2 weeks and have fever, cough, and shortness of breath . IF YOU DO NOT MEET THESE CRITERIA, YOU ARE CONSIDERED LOW RISK FOR COVID-19.  What to do if you are  HIGH RISK for COVID-19?  Marland Kitchen If you are having a medical emergency, call 911. . Seek medical care right away. Before you go to a doctor's office, urgent care or emergency department, call ahead and tell them about your recent travel, contact with someone diagnosed with COVID-19, and your symptoms. You should receive instructions from your physician's office regarding next steps of care.  . When you arrive at healthcare provider, tell the healthcare staff immediately you have returned from visiting [place], [place], [place], [place] or [place]; or traveled in the [place] to [place], [place], [place], or [place]; in the last two weeks or you have been in close contact with a person diagnosed with COVID-19 in  the last 2 weeks.   . Tell the health care staff about your symptoms: fever, cough and shortness of breath. . After you have been seen by a medical provider, you will be either: o Tested for (COVID-19) and discharged home on quarantine except to seek medical care if symptoms worsen, and asked to  - Stay home and avoid contact with others until you get your results (4-5 days)  - Avoid travel on public transportation if possible (such as bus, train, or airplane) or o Sent to the Emergency Department by EMS for evaluation, COVID-19 testing, and possible admission depending on your condition and test results.  What to do if you are LOW RISK for COVID-19?  Reduce your risk of any infection by using the same precautions used for avoiding the common cold or flu:  Marland Kitchen Wash your hands often with soap and warm water for at least 20 seconds.  If soap and water are not readily available, use an alcohol-based hand sanitizer with at least 60% alcohol.  . If coughing or sneezing, cover your mouth and nose by coughing or sneezing into the elbow areas of your shirt or coat, into a tissue or into your sleeve (not your hands). . Avoid shaking hands with others and consider head nods or verbal greetings  only. . Avoid touching your eyes, nose, or mouth with unwashed hands.  . Avoid close contact with people who are sick. . Avoid places or events with large numbers of people in one location, like concerts or sporting events. . Carefully consider travel plans you have or are making. . If you are planning any travel outside or inside the [place], visit the CDC's Travelers' Health webpage for the latest health notices. . If you have some symptoms but not all symptoms, continue to monitor at home and seek medical attention if your symptoms worsen. . If you are having a medical emergency, call 911.   [place] / e-Visit: [email or web address]         [facility]: [facility]: [phone]                   [facility]: [phone]

## 2018-12-31 NOTE — Progress Notes (Signed)
CATHFLO given at 6314 by Amy S, RN blood return was noted but it wasn't enough for labs. I drew labs peripherally.

## 2018-12-31 NOTE — Progress Notes (Signed)
12/31/18  Patient with reaction during first dose of Cyramza - spoke with Cassandra and we will give Pepcid 20 mg IVPB x 1 prior to [place] and run at 1 hour infusion while monitoring.  Orders entered as above.  T.O. Cassandra Heilingoetter, PA-C/Salimah Martinovich Ronnald Ramp, PharmD

## 2019-01-01 NOTE — Progress Notes (Signed)
    DATE:  12/31/2018                                          X  CHEMO/IMMUNOTHERAPY REACTION             MD:  Dr. Fanny Bien. Mohamed  AGENT/BLOOD PRODUCT RECEIVING TODAY:               Cyramza and Taxotere   AGENT/BLOOD PRODUCT RECEIVING IMMEDIATELY PRIOR TO REACTION:           Cyramza   VS: BP:      117/77   P:        61       SPO2:        100% on room air                   REACTION(S):            Itching   PREMEDS:      Decadron, Benadryl, Tylenol, and Pepcid   INTERVENTION: Benadryl 25 mg IV x1   Review of Systems  Review of Systems  Constitutional: Negative for chills, diaphoresis and fever.  HENT: Negative for trouble swallowing and voice change.   Respiratory: Negative for cough, chest tightness, shortness of breath and wheezing.   Cardiovascular: Negative for chest pain and palpitations.  Gastrointestinal: Negative for abdominal pain, constipation, diarrhea, nausea and vomiting.  Musculoskeletal: Negative for back pain and myalgias.  Skin:       Itching  Neurological: Negative for dizziness, light-headedness and headaches.     Physical Exam  Physical Exam Constitutional:      General: He is not in acute distress.    Appearance: He is not diaphoretic.     Comments: The patient is an adult male who appears older than his stated age and appears to be chronically ill but in no acute distress.  HENT:     Head: Normocephalic and atraumatic.  Cardiovascular:     Rate and Rhythm: Normal rate and regular rhythm.     Heart sounds: Normal heart sounds. No murmur. No friction rub. No gallop.   Pulmonary:     Effort: Pulmonary effort is normal. No respiratory distress.     Breath sounds: Normal breath sounds. No wheezing or rales.  Skin:    General: Skin is warm and dry.     Findings: No erythema or rash.     Comments: Multiple excoriations were noted over the patient's bilateral upper extremities.  Neurological:     Mental Status: He is alert.     OUTCOME:                  Itching resolved after dosing with Benadryl.  The patient was able to restart and complete his treatment of Cyramza and Taxotere.   Sandi Mealy, MHS, PA-C

## 2019-01-02 ENCOUNTER — Inpatient Hospital Stay: Payer: Medicare HMO

## 2019-01-02 ENCOUNTER — Other Ambulatory Visit: Payer: Self-pay

## 2019-01-02 VITALS — Temp 98.4°F

## 2019-01-02 DIAGNOSIS — C171 Malignant neoplasm of jejunum: Secondary | ICD-10-CM | POA: Diagnosis not present

## 2019-01-02 DIAGNOSIS — F17210 Nicotine dependence, cigarettes, uncomplicated: Secondary | ICD-10-CM | POA: Diagnosis not present

## 2019-01-02 DIAGNOSIS — Z79899 Other long term (current) drug therapy: Secondary | ICD-10-CM | POA: Diagnosis not present

## 2019-01-02 DIAGNOSIS — Z5112 Encounter for antineoplastic immunotherapy: Secondary | ICD-10-CM | POA: Diagnosis not present

## 2019-01-02 DIAGNOSIS — Z5111 Encounter for antineoplastic chemotherapy: Secondary | ICD-10-CM | POA: Diagnosis not present

## 2019-01-02 DIAGNOSIS — Z5189 Encounter for other specified aftercare: Secondary | ICD-10-CM | POA: Diagnosis not present

## 2019-01-02 DIAGNOSIS — C3491 Malignant neoplasm of unspecified part of right bronchus or lung: Secondary | ICD-10-CM

## 2019-01-02 DIAGNOSIS — Z452 Encounter for adjustment and management of vascular access device: Secondary | ICD-10-CM | POA: Diagnosis not present

## 2019-01-02 DIAGNOSIS — C3411 Malignant neoplasm of upper lobe, right bronchus or lung: Secondary | ICD-10-CM | POA: Diagnosis not present

## 2019-01-02 MED ORDER — PEGFILGRASTIM-JMDB 6 MG/0.6ML ~~LOC~~ SOSY
6.0000 mg | PREFILLED_SYRINGE | Freq: Once | SUBCUTANEOUS | Status: AC
  Administered 2019-01-02: 6 mg via SUBCUTANEOUS

## 2019-01-12 ENCOUNTER — Telehealth: Payer: Self-pay | Admitting: Internal Medicine

## 2019-01-12 NOTE — Telephone Encounter (Signed)
Scheduled per los. Called and spoke with patients wife. Confirmed all appts

## 2019-01-13 ENCOUNTER — Other Ambulatory Visit: Payer: Self-pay | Admitting: Medical Oncology

## 2019-01-13 ENCOUNTER — Telehealth: Payer: Self-pay | Admitting: Internal Medicine

## 2019-01-13 DIAGNOSIS — C3491 Malignant neoplasm of unspecified part of right bronchus or lung: Secondary | ICD-10-CM

## 2019-01-13 DIAGNOSIS — Z91041 Radiographic dye allergy status: Secondary | ICD-10-CM

## 2019-01-13 DIAGNOSIS — G894 Chronic pain syndrome: Secondary | ICD-10-CM

## 2019-01-13 MED ORDER — OXYCODONE-ACETAMINOPHEN 10-325 MG PO TABS: 1.0000 | ORAL_TABLET | Freq: Four times a day (QID) | ORAL | 0 refills | Status: DC | PRN

## 2019-01-13 MED ORDER — PREDNISONE 50 MG PO TABS: ORAL_TABLET | ORAL | 3 refills | Status: DC

## 2019-01-13 NOTE — Telephone Encounter (Signed)
He filled an Rx on 12/1.   I will go ahead and send a refill due to the holiday.   Gilles Chiquito, MD

## 2019-01-13 NOTE — Telephone Encounter (Signed)
Called pt's wife - asking if pain med has been refilled. Request sent to PCP.  Last rx written  11/13/18 Last OV  12/10/18 Next OV has not been scheduled UDS  10/03/17

## 2019-01-13 NOTE — Telephone Encounter (Signed)
Pt is requesting a nurse call back [phone] Regarding medicine

## 2019-01-18 ENCOUNTER — Ambulatory Visit (HOSPITAL_COMMUNITY)
Admission: RE | Admit: 2019-01-18 | Discharge: 2019-01-18 | Disposition: A | Discharge status: Home / Self Care | Payer: Medicare HMO | Source: Ambulatory Visit | Attending: Physician Assistant | Admitting: Physician Assistant

## 2019-01-18 ENCOUNTER — Telehealth: Payer: Self-pay | Admitting: Medical Oncology

## 2019-01-18 ENCOUNTER — Other Ambulatory Visit: Payer: Self-pay

## 2019-01-18 DIAGNOSIS — C3491 Malignant neoplasm of unspecified part of right bronchus or lung: Secondary | ICD-10-CM | POA: Diagnosis not present

## 2019-01-18 DIAGNOSIS — Z5111 Encounter for antineoplastic chemotherapy: Secondary | ICD-10-CM | POA: Diagnosis not present

## 2019-01-18 DIAGNOSIS — C171 Malignant neoplasm of jejunum: Secondary | ICD-10-CM | POA: Diagnosis not present

## 2019-01-18 MED ORDER — IOHEXOL 300 MG/ML  SOLN
75.0000 mL | Freq: Once | INTRAMUSCULAR | Status: AC | PRN
  Administered 2019-01-18: 75 mL via INTRAVENOUS

## 2019-01-18 MED ORDER — SODIUM CHLORIDE (PF) 0.9 % IJ SOLN
INTRAMUSCULAR | Status: AC
  Filled 2019-01-18: qty 50

## 2019-01-18 NOTE — Telephone Encounter (Signed)
Does he need to start his decadron tomorrow -he took prednisone today for CT scan allergy. Per Dr Julien Nordmann , I instructed Brett [name] that pt needs to take decadron as prescribed.

## 2019-01-19 ENCOUNTER — Inpatient Hospital Stay (HOSPITAL_BASED_OUTPATIENT_CLINIC_OR_DEPARTMENT_OTHER): Payer: Medicare HMO | Admitting: Internal Medicine

## 2019-01-19 ENCOUNTER — Encounter: Payer: Self-pay | Admitting: Internal Medicine

## 2019-01-19 ENCOUNTER — Encounter: Payer: Self-pay | Admitting: *Deleted

## 2019-01-19 ENCOUNTER — Telehealth: Payer: Self-pay | Admitting: *Deleted

## 2019-01-19 ENCOUNTER — Other Ambulatory Visit: Payer: Self-pay

## 2019-01-19 ENCOUNTER — Ambulatory Visit (HOSPITAL_COMMUNITY): Payer: Medicare HMO

## 2019-01-19 ENCOUNTER — Inpatient Hospital Stay: Payer: Medicare HMO

## 2019-01-19 ENCOUNTER — Other Ambulatory Visit: Payer: Medicare HMO

## 2019-01-19 ENCOUNTER — Ambulatory Visit: Payer: Medicare HMO | Admitting: Internal Medicine

## 2019-01-19 VITALS — BP 113/73 | HR 64 | Temp 98.3°F | Resp 18 | Ht 67.0 in | Wt 100.0 lb

## 2019-01-19 DIAGNOSIS — Z5189 Encounter for other specified aftercare: Secondary | ICD-10-CM | POA: Diagnosis not present

## 2019-01-19 DIAGNOSIS — Z720 Tobacco use: Secondary | ICD-10-CM | POA: Diagnosis not present

## 2019-01-19 DIAGNOSIS — C3491 Malignant neoplasm of unspecified part of right bronchus or lung: Secondary | ICD-10-CM

## 2019-01-19 DIAGNOSIS — C7A1 Malignant poorly differentiated neuroendocrine tumors: Secondary | ICD-10-CM | POA: Diagnosis not present

## 2019-01-19 DIAGNOSIS — R932 Abnormal findings on diagnostic imaging of liver and biliary tract: Secondary | ICD-10-CM

## 2019-01-19 DIAGNOSIS — Z5111 Encounter for antineoplastic chemotherapy: Secondary | ICD-10-CM | POA: Diagnosis not present

## 2019-01-19 DIAGNOSIS — R935 Abnormal findings on diagnostic imaging of other abdominal regions, including retroperitoneum: Secondary | ICD-10-CM | POA: Diagnosis not present

## 2019-01-19 DIAGNOSIS — C3490 Malignant neoplasm of unspecified part of unspecified bronchus or lung: Secondary | ICD-10-CM

## 2019-01-19 DIAGNOSIS — R911 Solitary pulmonary nodule: Secondary | ICD-10-CM | POA: Diagnosis not present

## 2019-01-19 DIAGNOSIS — R197 Diarrhea, unspecified: Secondary | ICD-10-CM | POA: Diagnosis not present

## 2019-01-19 DIAGNOSIS — Z79899 Other long term (current) drug therapy: Secondary | ICD-10-CM | POA: Diagnosis not present

## 2019-01-19 DIAGNOSIS — Z452 Encounter for adjustment and management of vascular access device: Secondary | ICD-10-CM | POA: Diagnosis not present

## 2019-01-19 DIAGNOSIS — C171 Malignant neoplasm of jejunum: Secondary | ICD-10-CM

## 2019-01-19 DIAGNOSIS — E119 Type 2 diabetes mellitus without complications: Secondary | ICD-10-CM

## 2019-01-19 DIAGNOSIS — Z5112 Encounter for antineoplastic immunotherapy: Secondary | ICD-10-CM

## 2019-01-19 DIAGNOSIS — C3411 Malignant neoplasm of upper lobe, right bronchus or lung: Secondary | ICD-10-CM

## 2019-01-19 DIAGNOSIS — F17210 Nicotine dependence, cigarettes, uncomplicated: Secondary | ICD-10-CM | POA: Diagnosis not present

## 2019-01-19 DIAGNOSIS — Z95828 Presence of other vascular implants and grafts: Secondary | ICD-10-CM

## 2019-01-19 LAB — CMP (CANCER CENTER ONLY)
ALT: 25 U/L (ref 0–44)
AST: 38 U/L (ref 15–41)
Albumin: 2.8 g/dL — ABNORMAL LOW (ref 3.5–5.0)
Alkaline Phosphatase: 141 U/L — ABNORMAL HIGH (ref 38–126)
Anion gap: 8 (ref 5–15)
BUN: 8 mg/dL (ref 6–20)
CO2: 27 mmol/L (ref 22–32)
Calcium: 8.3 mg/dL — ABNORMAL LOW (ref 8.9–10.3)
Chloride: 101 mmol/L (ref 98–111)
Creatinine: 0.44 mg/dL — ABNORMAL LOW (ref 0.61–1.24)
GFR, Est AFR Am: >60 mL/min (ref >60)
GFR, Estimated: >60 mL/min (ref >60)
Glucose, Bld: 331 mg/dL — ABNORMAL HIGH (ref 70–99)
Potassium: 4.4 mmol/L (ref 3.5–5.1)
Sodium: 136 mmol/L (ref 135–145)
Total Bilirubin: 0.7 mg/dL (ref 0.3–1.2)
Total Protein: 5.2 g/dL — ABNORMAL LOW (ref 6.5–8.1)

## 2019-01-19 LAB — CBC WITH DIFFERENTIAL (CANCER CENTER ONLY)
Abs Immature Granulocytes: 0.15 10*3/uL — ABNORMAL HIGH (ref 0.00–0.07)
Basophils Absolute: 0 10*3/uL (ref 0.0–0.1)
Basophils Relative: 0 %
Eosinophils Absolute: 0 10*3/uL (ref 0.0–0.5)
Eosinophils Relative: 0 %
HCT: 35.8 % — ABNORMAL LOW (ref 39.0–52.0)
Hemoglobin: 11.8 g/dL — ABNORMAL LOW (ref 13.0–17.0)
Immature Granulocytes: 1 %
Lymphocytes Relative: 4 %
Lymphs Abs: 0.7 10*3/uL (ref 0.7–4.0)
MCH: 31.6 pg (ref 26.0–34.0)
MCHC: 33 g/dL (ref 30.0–36.0)
MCV: 96 fL (ref 80.0–100.0)
Monocytes Absolute: 0.4 10*3/uL (ref 0.1–1.0)
Monocytes Relative: 2 %
Neutro Abs: 15.8 10*3/uL — ABNORMAL HIGH (ref 1.7–7.7)
Neutrophils Relative %: 93 %
Platelet Count: 312 10*3/uL (ref 150–400)
RBC: 3.73 MIL/uL — ABNORMAL LOW (ref 4.22–5.81)
RDW: 14.5 % (ref 11.5–15.5)
WBC Count: 17 10*3/uL — ABNORMAL HIGH (ref 4.0–10.5)
nRBC: 0 % (ref 0.0–0.2)

## 2019-01-19 LAB — TSH: TSH: 1.611 u[IU]/mL (ref 0.320–4.118)

## 2019-01-19 LAB — TOTAL PROTEIN, URINE DIPSTICK: Protein, ur: NEGATIVE mg/dL

## 2019-01-19 MED ORDER — SODIUM CHLORIDE 0.9% FLUSH
10.0000 mL | INTRAVENOUS | Status: DC | PRN
  Administered 2019-01-19: 10 mL
  Filled 2019-01-19: qty 10

## 2019-01-19 NOTE — Progress Notes (Signed)
[facility] [phone]   [phone]  OFFICE PROGRESS NOTE  Sid Falcon, MD [place] [address]  DIAGNOSIS: Stage IV (T1b, N0, M1b) non-small cell lung cancer, adenocarcinoma presented with right upper lobe lung nodule and recent metastasis to the small intestine. This was initially diagnosed in September 2017.  Genomic Alterations Identified? ERBB2 amplification - equivocal? CDKN2A p16INK4a E88* and p14ARF W119J SMARCA4 splice site [id number] SPTA1 E2022* TOP2A amplification TP53 A159P Additional Findings? Microsatellite status MS-Stable Tumor Mutation Burden TMB-Intermediate; 18 Muts/Mb Additional Disease-relevant Genes with No Reportable Alterations Identified? EGFR KRAS ALK BRAF MET RET ROS1   PRIOR THERAPY:  1) Status post right VATS with right upper lobectomy and mediastinal lymph node dissection under the care of Dr. Roxan Hockey on 10/18/2015 and the final pathology was consistent with stage IA (T1b, N0, MX). 2) upper endoscopy on 01/05/2016 showed normal esophagus, normal stomach but there was occasional mass around 3.0 CM in length circumferential nonobstructing in the jejunum. The final pathology was consistent with metastatic adenocarcinoma. 3) status post laparoscopic laparotomy and resection of proximal lesion and and distal jejunum/proximal ileum under the care of Dr. Hassell Done 1 02/27/2016. 3)  Systemic chemotherapy with carboplatin for AUC of 5 and Alimta 500 MG/M2 every 3 weeks. First dose 04/04/2016. Status post 2 cycles. Last dose was given 04/21/2016 discontinued secondary to disease progression. 4) Second line immunotherapy with Ketruda 200 mg IV every 2 weeks, first dose 05/30/2016. Status post 40 cycles.  Last dose October 22, 2018.  Discontinued today secondary to disease progression.  CURRENT THERAPY: Systemic chemotherapy with docetaxel 75 mg/M2 and Cyramza 10 mg/KG every 3 weeks.  First dose  November 19, 2018.   INTERVAL HISTORY: Brett [name] 58 y.o. male returns to the clinic today for follow-up visit.  I also spoke to his wife after the visit to update her about our discussion.  The patient is feeling fine today with no concerning complaints except for the fatigue and mild shortness of breath with exertion.  He gained around 7 pounds since his last visit.  He denied having any chest pain, cough or hemoptysis.  He denied having any fever or chills.  He has no nausea, vomiting, diarrhea or constipation.  He has no headache or visual changes.  He has been tolerating his treatment with docetaxel and Cyramza well except for the fatigue.  He had repeat CT scan of the chest, abdomen pelvis performed recently and he is here for evaluation and discussion of his scan results.  MEDICAL HISTORY: Past Medical History:  Diagnosis Date  . Barrett's esophagus 07/01/2013   Without dysplasia on biopsy 09/03/2012. Repeat EGD recommended 08/2015  . Bilateral cataracts 02/13/2017  . Carotid artery stenosis 07/01/2013   Requiring right sided stent   . Chronic pain syndrome 07/01/2013  . Closed head injury with brief loss of consciousness ([place]) 07/22/2010   Head trapped in a hydraulic device at work.  Fracture of orbital bones on right and brief loss of consciousness per report.  . 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 There was likely a strong psycho-social overlay in regard to the cognitive deficits in the form of mood disorder with psychotic features and mixed anxiety symptomatology. His primary tested cognitive deficits are in the areas of attention, executi  . Daily headache "since 07/2010"   constantly  .  Degenerative joint disease of cervical spine 07/01/2013  . Dupuytren's contracture of both hands 04/08/2014  . Encounter for antineoplastic chemotherapy  10/05/2015  . Encounter for antineoplastic immunotherapy 05/24/2016  . Erectile dysfunction associated with type 2 diabetes mellitus ([place]) 07/01/2013  . Fibromyalgia 07/01/2013  . Goals of care, counseling/discussion 03/28/2016  . History of blood transfusion 11/28/2015   "suppose to get his first today" (11/28/2015)  . Hyperlipidemia LDL goal < 100 07/01/2013  . Intractable hiccups 04/11/2016  . 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 Memory changes    "memory issues" from head injury  . Moderate protein-calorie malnutrition ([place]) 11/29/2015  . Osteoarthritis of right thumb 10/21/2014  . Peripheral vascular occlusive disease ([place]) 07/01/2013   Requiring 2 arterial stents above the left knee per report  . Pneumonia ~ 2006/2007  . Post traumatic stress disorder 07/01/2013  . Primary lung adenocarcinoma ([place]) dx'd 08/2015   "right lung"  . Severe major depression with psychotic features ([place]) 04/15/2011  . Tobacco abuse 07/01/2013  . Type 2 diabetes mellitus with vascular disease ([place]) 07/01/2013   Left lower extremity and right carotid stenting    ALLERGIES:  is allergic to gabapentin; lyrica [pregabalin]; jardiance [empagliflozin]; celebrex [celecoxib]; and contrast media [iodinated diagnostic agents].  MEDICATIONS:  Current Outpatient Medications  Medication Sig Dispense Refill  . ACCU-CHEK SOFTCLIX LANCETS lancets Use to test blood glucose 1-2 times daily. Dx Code E11.59 100 each 12  . aspirin EC 81 MG tablet Take 81 mg by mouth daily.     Marland Kitchen atorvastatin (LIPITOR) 40 MG tablet Take 1 tablet (40 mg total) by mouth daily. 90 tablet 3  . Blood Glucose Monitoring Suppl (ACCU-CHEK AVIVA PLUS) w/Device KIT Use to test blood glucose 1-2 times daily. Dx Code E11.59 1 kit 0  . dexamethasone (DECADRON) 4 MG tablet 4 mg p.o. twice daily the day before, day of and day after chemotherapy every 3 weeks. (Patient taking differently: Take 4 mg by mouth See admin instructions. Take 1 tablet. twice daily the day  before, day of and day after chemotherapy every 3 weeks.) 40 tablet 1  . glipiZIDE (GLUCOTROL) 10 MG tablet Take 1 tablet (10 mg total) by mouth 2 (two) times daily before a meal. 180 tablet 3  . glucose blood test strip USE TO TEST BLOOD GLUCOSE 1 TO 2 TIMES DAILY 100 each 4  . insulin aspart (NOVOLOG) 100 UNIT/ML FlexPen Inject 1-9 units into the skin with your 2 biggest meals  based on the sliding scale provided to you. 5 pen 3  . Insulin Glargine (LANTUS) 100 UNIT/ML Solostar Pen Inject 5 Units into the skin daily. 15 mL 3  . Insulin Pen Needle (PEN NEEDLES) 32G X 4 MM MISC 1 pen by Does not apply route 2 (two) times daily. Dx Code E11.59 200 each 6  . levothyroxine (SYNTHROID) 50 MCG tablet Take 1 tablet (50 mcg total) by mouth daily before breakfast. 30 tablet 2  . oxyCODONE-acetaminophen (PERCOCET) 10-325 MG tablet Take 1 tablet by mouth every 6 (six) hours as needed for pain. 30 tablet 0  . oxyCODONE-acetaminophen (PERCOCET) 10-325 MG tablet Take 1-2 tablets by mouth every 6 (six) hours as needed for pain. 240 tablet 0  . pantoprazole (PROTONIX) 40 MG tablet TAKE 1 TABLET(40 MG) BY MOUTH DAILY (Patient taking differently: Take 40 mg by mouth daily. ) 90 tablet 1  . predniSONE (DELTASONE) 50 MG tablet Take one tablet 13 hours , one tablet 7 hours and one tablet 1 hour  prior to CT scan. 3 tablet 3  . prochlorperazine (COMPAZINE) 10 MG tablet Take 1 tablet (10 mg total) by mouth every 6 (six) hours as needed for nausea or vomiting. 30 tablet 5  . sitaGLIPtin (JANUVIA) 100 MG tablet Take 1 tablet (100 mg total) by mouth daily. 90 tablet 3   No current facility-administered medications for this visit.    SURGICAL HISTORY:  Past Surgical History:  Procedure Laterality Date  . CAROTID STENT Right ?2014  . COLONOSCOPY N/A 11/30/2015   Procedure: COLONOSCOPY;  Surgeon: Teena Irani, MD;  Location: [facility] ENDOSCOPY;  Service: Endoscopy;  Laterality: N/A;  . ESOPHAGOGASTRODUODENOSCOPY N/A 11/30/2015    Procedure: ESOPHAGOGASTRODUODENOSCOPY (EGD);  Surgeon: Teena Irani, MD;  Location: [facility] ENDOSCOPY;  Service: Endoscopy;  Laterality: N/A;  . ESOPHAGOGASTRODUODENOSCOPY (EGD) WITH PROPOFOL N/A 01/05/2016   Procedure: ESOPHAGOGASTRODUODENOSCOPY (EGD) WITH PROPOFOL;  Surgeon: Teena Irani, MD;  Location: [facility] ENDOSCOPY;  Service: Endoscopy;  Laterality: N/A;  . FEMORAL ARTERY STENT Left 05/2012; ~ 2015   Archie Endo 06/04/2012; Raechel Chute report  . FRACTURE SURGERY    . GIVENS CAPSULE STUDY N/A 12/22/2015   Procedure: GIVENS CAPSULE STUDY;  Surgeon: Wonda Horner, MD;  Location: [facility] ENDOSCOPY;  Service: Endoscopy;  Laterality: N/A;  . HARDWARE REMOVAL Right 11/15/2011   Removal of deep frontozygomatic orbital hardware/notes 11/15/2011  . HERNIA REPAIR  2878   Umbilical  . LAPAROSCOPY N/A 02/27/2016   Procedure: LAPAROSCOPY, LAPAROTOMY  WITH TWO SMALL BOWEL RESECTION;  Surgeon: Johnathan Hausen, MD;  Location: [facility] ORS;  Service: General;  Laterality: N/A;  . ORIF ORBITAL FRACTURE Right 08/15/2010    caught in a hydraulic machine; open reduction internal fixation of orbital rim fracture and open reduction of zygomatic arch fracture  Ar[facility] Endo 10/13/2009  . PORTACATH PLACEMENT Left 04/04/2016   Procedure: INSERTION PORT-A-CATH LEFT CHEST;  Surgeon: Melrose Nakayama, MD;  Location: [facility];  Service: Thoracic;  Laterality: Left;  Marland Kitchen VIDEO ASSISTED THORACOSCOPY (VATS)/ LOBECTOMY Right 10/18/2015   Procedure: VIDEO ASSISTED THORACOSCOPY (VATS)/ LOBECTOMY;  Surgeon: Melrose Nakayama, MD;  Location: [facility];  Service: Thoracic;  Laterality: Right;  Marland Kitchen VIDEO BRONCHOSCOPY Bilateral 09/21/2015   Procedure: VIDEO BRONCHOSCOPY WITH FLUORO;  Surgeon: Juanito Doom, MD;  Location: [facility] ENDOSCOPY;  Service: Cardiopulmonary;  Laterality: Bilateral;    REVIEW OF SYSTEMS:  Constitutional: positive for fatigue Eyes: negative Ears, nose, mouth, throat, and face: negative Respiratory: positive for dyspnea on exertion Cardiovascular:  negative Gastrointestinal: positive for diarrhea Genitourinary:negative Integument/breast: negative Hematologic/lymphatic: negative Musculoskeletal:positive for arthralgias Neurological: negative Behavioral/Psych: negative Endocrine: negative Allergic/Immunologic: negative   PHYSICAL EXAMINATION: General appearance: alert, cooperative, fatigued and no distress Head: Normocephalic, without obvious abnormality, atraumatic Neck: no adenopathy, no JVD, supple, symmetrical, trachea midline and thyroid not enlarged, symmetric, no tenderness/mass/nodules Lymph nodes: Cervical, supraclavicular, and axillary nodes normal. Resp: clear to auscultation bilaterally Back: symmetric, no curvature. ROM normal. No CVA tenderness. Cardio: regular rate and rhythm, S1, S2 normal, no murmur, click, rub or gallop GI: soft, non-tender; bowel sounds normal; no masses,  no organomegaly Extremities: extremities normal, atraumatic, no cyanosis or edema Neurologic: Alert and oriented X 3, normal strength and tone. Normal symmetric reflexes. Normal coordination and gait  ECOG PERFORMANCE STATUS: 1 - Symptomatic but completely ambulatory  Blood pressure 113/73, pulse 64, temperature 98.3 F (36.8 C), temperature source Oral, resp. rate 18, height _0  (1.702 m), weight 100 lb (45.4 kg), SpO2 100 %.  LABORATORY DATA: Lab Results  Component Value Date   WBC 17.0 (H)  01/19/2019   HGB 11.8 (L) 01/19/2019   HCT 35.8 (L) 01/19/2019   MCV 96.0 01/19/2019   PLT 312 01/19/2019      Chemistry      Component Value Date/Time   NA 135 12/31/2018 1112   NA 140 01/23/2017 0910   K 4.6 12/31/2018 1112   K 4.5 01/23/2017 0910   CL 97 (L) 12/31/2018 1112   CO2 30 12/31/2018 1112   CO2 28 01/23/2017 0910   BUN 8 12/31/2018 1112   BUN 8.1 01/23/2017 0910   CREATININE 0.73 12/31/2018 1112   CREATININE 0.8 01/23/2017 0910   GLU 398 (H) 08/08/2016 1257      Component Value Date/Time   CALCIUM 9.1 12/31/2018 1112     CALCIUM 9.6 01/23/2017 0910   ALKPHOS 122 12/31/2018 1112   ALKPHOS 113 01/23/2017 0910   AST 25 12/31/2018 1112   AST 11 01/23/2017 0910   ALT 19 12/31/2018 1112   ALT 12 01/23/2017 0910   BILITOT 0.6 12/31/2018 1112   BILITOT 0.40 01/23/2017 0910       RADIOGRAPHIC STUDIES: CT Chest W Contrast  Result Date: 01/18/2019 CLINICAL DATA:  RIGHT lung cancer. Jejunal cancer. Chemotherapy ongoing. Diffuse abdominal pain. EXAM: CT CHEST, ABDOMEN, AND PELVIS WITH CONTRAST TECHNIQUE: Multidetector CT imaging of the chest, abdomen and pelvis was performed following the standard protocol during bolus administration of intravenous contrast. CONTRAST:  14m OMNIPAQUE IOHEXOL 300 MG/ML  SOLN COMPARISON:  CT abdomen 12/08/2018, chest CT 11/10/2018 FINDINGS: CT CHEST FINDINGS Cardiovascular: No significant vascular findings. Normal heart size. No pericardial effusion. Coronary artery calcification and aortic atherosclerotic calcification. Mediastinum/Nodes: No axillary supraclavicular adenopathy. Increase in size of low-density lymph node in the high mediastinum (thoracic inlet) measuring 1.6 cm (image 16/2). No pericardial effusion. Esophagus normal. Lungs/Pleura: New irregular nodule in the RIGHT lower lobe measures 10 mm (image 122/6). Less well-defined peribronchial thickening in the LEFT upper lobe on image 78/6) Musculoskeletal: No aggressive osseous lesion. CT ABDOMEN AND PELVIS FINDINGS Hepatobiliary: Low-attenuation liver. Enhancing lesion in the inferior inferior LEFT hepatic lobe adjacent to the gallbladder fossa measuring 11 mm on image 64/2. Pancreas: Pancreas is normal. No ductal dilatation. No pancreatic inflammation. Spleen: Normal spleen Adrenals/urinary tract: Adrenal glands and kidneys are normal. The ureters and bladder normal. Stomach/Bowel: Medications in the stomach. Duodenum and small-bowel normal. There is anastomosis in the small bowel. Terminal ileum normal. Ascending, transverse and  descending colon normal. Rectosigmoid colon normal. Vascular/Lymphatic: Calcification abdominal aorta. Interval increase in size of low-density periaortic retroperitoneal lymphadenopathy. For example 3.6 cm nodal mass adjacent to the LEFT kidney compares to 2.7 cm on CT 12/08/2018. Lymph node complex deep to the IVC at the same level measures 3.2 cm (image 73/2) compared to 2.9 cm. Nodal implant within the central mesentery measuring 2.5 by 1.6 cm (image 73/2) increased from 2.5 x 1.1 cm. Reproductive: Prostate normal. Other: No free fluid. Musculoskeletal: No aggressive osseous lesion. IMPRESSION: Chest Impression: 1. New nodularity in the RIGHT lower lobe is concerning for metastatic lesion. Focus of pulmonary infection would be secondary differential. Recommend close attention on follow-up. 2. Peribronchial thickening in the LEFT upper lobe warrants attention on follow-up. 3. Metastatic lymph node in the thoracic inlet/prevascular space. Abdomen / Pelvis Impression: 1. Interval increase in size of bulky low-density periaortic retroperitoneal lymphadenopathy. 2. Interval increase in size of mesenteric nodal implant. 3. Enhancing lesion in the inferior LEFT hepatic lobe. Differential includes hepatic metastasis versus focal fatty sparing. Location favors focal fatty sparing. Recommend  attention on follow-up. 4. Postsurgical change consistent bowel resection. No evidence local recurrence. Electronically Signed   By: Suzy Bouchard M.D.   On: 01/18/2019 16:58   CT Abdomen Pelvis W Contrast  Result Date: 01/18/2019 CLINICAL DATA:  RIGHT lung cancer. Jejunal cancer. Chemotherapy ongoing. Diffuse abdominal pain. EXAM: CT CHEST, ABDOMEN, AND PELVIS WITH CONTRAST TECHNIQUE: Multidetector CT imaging of the chest, abdomen and pelvis was performed following the standard protocol during bolus administration of intravenous contrast. CONTRAST:  14m OMNIPAQUE IOHEXOL 300 MG/ML  SOLN COMPARISON:  CT abdomen 12/08/2018,  chest CT 11/10/2018 FINDINGS: CT CHEST FINDINGS Cardiovascular: No significant vascular findings. Normal heart size. No pericardial effusion. Coronary artery calcification and aortic atherosclerotic calcification. Mediastinum/Nodes: No axillary supraclavicular adenopathy. Increase in size of low-density lymph node in the high mediastinum (thoracic inlet) measuring 1.6 cm (image 16/2). No pericardial effusion. Esophagus normal. Lungs/Pleura: New irregular nodule in the RIGHT lower lobe measures 10 mm (image 122/6). Less well-defined peribronchial thickening in the LEFT upper lobe on image 78/6) Musculoskeletal: No aggressive osseous lesion. CT ABDOMEN AND PELVIS FINDINGS Hepatobiliary: Low-attenuation liver. Enhancing lesion in the inferior inferior LEFT hepatic lobe adjacent to the gallbladder fossa measuring 11 mm on image 64/2. Pancreas: Pancreas is normal. No ductal dilatation. No pancreatic inflammation. Spleen: Normal spleen Adrenals/urinary tract: Adrenal glands and kidneys are normal. The ureters and bladder normal. Stomach/Bowel: Medications in the stomach. Duodenum and small-bowel normal. There is anastomosis in the small bowel. Terminal ileum normal. Ascending, transverse and descending colon normal. Rectosigmoid colon normal. Vascular/Lymphatic: Calcification abdominal aorta. Interval increase in size of low-density periaortic retroperitoneal lymphadenopathy. For example 3.6 cm nodal mass adjacent to the LEFT kidney compares to 2.7 cm on CT 12/08/2018. Lymph node complex deep to the IVC at the same level measures 3.2 cm (image 73/2) compared to 2.9 cm. Nodal implant within the central mesentery measuring 2.5 by 1.6 cm (image 73/2) increased from 2.5 x 1.1 cm. Reproductive: Prostate normal. Other: No free fluid. Musculoskeletal: No aggressive osseous lesion. IMPRESSION: Chest Impression: 1. New nodularity in the RIGHT lower lobe is concerning for metastatic lesion. Focus of pulmonary infection would be  secondary differential. Recommend close attention on follow-up. 2. Peribronchial thickening in the LEFT upper lobe warrants attention on follow-up. 3. Metastatic lymph node in the thoracic inlet/prevascular space. Abdomen / Pelvis Impression: 1. Interval increase in size of bulky low-density periaortic retroperitoneal lymphadenopathy. 2. Interval increase in size of mesenteric nodal implant. 3. Enhancing lesion in the inferior LEFT hepatic lobe. Differential includes hepatic metastasis versus focal fatty sparing. Location favors focal fatty sparing. Recommend attention on follow-up. 4. Postsurgical change consistent bowel resection. No evidence local recurrence. Electronically Signed   By: SSuzy BouchardM.D.   On: 01/18/2019 16:58     ASSESSMENT AND PLAN:  This is a very pleasant 58years old white male with metastatic non-small cell lung cancer, adenocarcinoma status post right upper lobectomy with lymph node dissection. Unfortunately the patient was found to have metastatic disease in the jejunum and terminal ileum.  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 The patient was started on treatment with systemic chemotherapy with carboplatin and Alimta for 2 cycles discontinued secondary to intolerance and disease progression. The patient completed treatment with second line immunotherapy with Ketruda (pembrolizumab) 200 mg IV every 3 weeks, status post 40 cycles.  He has been tolerating this treatment well. This treatment was  discontinued secondary to disease progression. The patient was started on systemic chemotherapy with docetaxel and Cyramza status post 2 cycles.  He had repeat CT scan of the chest, abdomen pelvis performed recently.  Unfortunately the patient continues to have some evidence for disease progression with increase in the abdominal lymphadenopathy as  well as new pulmonary nodule and suspicious liver lesion. I recommended for the patient to discontinue his current treatment with docetaxel and Cyramza at this point.  I will order a PET scan for further evaluation of his disease and to identify a good lesion for rebiopsy because of his previous history of 2 malignancy including adenocarcinoma as well as high-grade neuroendocrine carcinoma. I would see the patient back for follow-up visit in 2 weeks for reevaluation and discussion of his PET scan results and recommendation regarding his condition. For the diarrhea, he will continue on Imodium. For the diabetes mellitus he is followed by Dr. Daryll Drown at the [facility] internal medicine teaching program. He was advised to call immediately if he has any other concerning symptoms in the interval. The patient voices understanding of current disease status and treatment options and is in agreement with the current care plan. All questions were answered. The patient knows to call the clinic with any problems, questions or concerns. We can certainly see the patient much sooner if necessary.  Disclaimer: This note was dictated with voice recognition software. Similar sounding words can inadvertently be transcribed and may not be corrected upon review.

## 2019-01-20 ENCOUNTER — Ambulatory Visit: Payer: Medicare HMO

## 2019-01-21 ENCOUNTER — Other Ambulatory Visit: Payer: Medicare HMO

## 2019-01-21 ENCOUNTER — Ambulatory Visit: Payer: Medicare HMO | Admitting: Internal Medicine

## 2019-01-21 ENCOUNTER — Telehealth: Payer: Self-pay | Admitting: Internal Medicine

## 2019-01-21 NOTE — Telephone Encounter (Signed)
Scheduled per los. Called and spoke with patients wife. Confirmed appt

## 2019-01-22 ENCOUNTER — Ambulatory Visit: Payer: Medicare HMO

## 2019-01-23 ENCOUNTER — Ambulatory Visit: Payer: Medicare HMO

## 2019-01-29 ENCOUNTER — Encounter (HOSPITAL_COMMUNITY): Payer: Self-pay | Admitting: Emergency Medicine

## 2019-01-29 ENCOUNTER — Encounter (HOSPITAL_COMMUNITY): Payer: Self-pay

## 2019-01-29 ENCOUNTER — Ambulatory Visit (HOSPITAL_COMMUNITY)
Admission: RE | Admit: 2019-01-29 | Discharge: 2019-01-29 | Disposition: A | Discharge status: Home / Self Care | Payer: Medicare HMO | Source: Ambulatory Visit | Attending: Internal Medicine | Admitting: Internal Medicine

## 2019-01-29 ENCOUNTER — Other Ambulatory Visit: Payer: Self-pay

## 2019-01-29 ENCOUNTER — Telehealth: Payer: Self-pay | Admitting: Physician Assistant

## 2019-01-29 ENCOUNTER — Emergency Department (HOSPITAL_COMMUNITY)
Admission: EM | Admit: 2019-01-29 | Discharge: 2019-01-29 | Disposition: A | Discharge status: Home / Self Care | Payer: Medicare HMO | Attending: Emergency Medicine | Admitting: Emergency Medicine

## 2019-01-29 DIAGNOSIS — E1140 Type 2 diabetes mellitus with diabetic neuropathy, unspecified: Secondary | ICD-10-CM | POA: Diagnosis not present

## 2019-01-29 DIAGNOSIS — Z79899 Other long term (current) drug therapy: Secondary | ICD-10-CM | POA: Insufficient documentation

## 2019-01-29 DIAGNOSIS — Z85068 Personal history of other malignant neoplasm of small intestine: Secondary | ICD-10-CM | POA: Insufficient documentation

## 2019-01-29 DIAGNOSIS — Z955 Presence of coronary angioplasty implant and graft: Secondary | ICD-10-CM | POA: Insufficient documentation

## 2019-01-29 DIAGNOSIS — Z7982 Long term (current) use of aspirin: Secondary | ICD-10-CM | POA: Insufficient documentation

## 2019-01-29 DIAGNOSIS — E162 Hypoglycemia, unspecified: Secondary | ICD-10-CM | POA: Diagnosis not present

## 2019-01-29 DIAGNOSIS — F17210 Nicotine dependence, cigarettes, uncomplicated: Secondary | ICD-10-CM | POA: Diagnosis not present

## 2019-01-29 DIAGNOSIS — Z9221 Personal history of antineoplastic chemotherapy: Secondary | ICD-10-CM | POA: Diagnosis not present

## 2019-01-29 DIAGNOSIS — E11649 Type 2 diabetes mellitus with hypoglycemia without coma: Secondary | ICD-10-CM | POA: Diagnosis not present

## 2019-01-29 DIAGNOSIS — Z85118 Personal history of other malignant neoplasm of bronchus and lung: Secondary | ICD-10-CM | POA: Insufficient documentation

## 2019-01-29 DIAGNOSIS — C3490 Malignant neoplasm of unspecified part of unspecified bronchus or lung: Secondary | ICD-10-CM

## 2019-01-29 DIAGNOSIS — Z794 Long term (current) use of insulin: Secondary | ICD-10-CM | POA: Diagnosis not present

## 2019-01-29 LAB — GLUCOSE, CAPILLARY
Glucose-Capillary: 23 mg/dL — CL (ref 70–99)
Glucose-Capillary: 24 mg/dL — CL (ref 70–99)
Glucose-Capillary: 47 mg/dL — ABNORMAL LOW (ref 70–99)

## 2019-01-29 LAB — CBG MONITORING, ED
Glucose-Capillary: 56 mg/dL — ABNORMAL LOW (ref 70–99)
Glucose-Capillary: 95 mg/dL (ref 70–99)

## 2019-01-29 MED ORDER — HEPARIN SOD (PORK) LOCK FLUSH 100 UNIT/ML IV SOLN
500.0000 [IU] | Freq: Once | INTRAVENOUS | Status: AC
  Administered 2019-01-29: 13:00:00 500 [IU]

## 2019-01-29 MED ORDER — DEXTROSE 50 % IV SOLN
INTRAVENOUS | Status: AC
  Filled 2019-01-29: qty 50

## 2019-01-29 MED ORDER — HEPARIN SOD (PORK) LOCK FLUSH 100 UNIT/ML IV SOLN
INTRAVENOUS | Status: AC
  Filled 2019-01-29: qty 5

## 2019-01-29 NOTE — ED Triage Notes (Signed)
Pt went to have PET scan today and CBG was 23. Pt was given some orange juice and crackers. Was brought to ED for continued evaluation. Pt reports is a diabetic but hasnt eaten or taken medications prior to scan/test.

## 2019-01-29 NOTE — ED Notes (Signed)
Pt given orange juice and crackers with peanut butter. Pt states, "I already did that". This nurse informed patient that to get his blood sugar up he needs to eat and drink.

## 2019-01-29 NOTE — ED Provider Notes (Signed)
[facility] DEPT Provider Note   CSN: [id number] Arrival date & time: 01/29/19  1201     History Chief Complaint  Patient presents with  . Hypoglycemia    Brett [name] is a 59 y.o. male.  Presents to ER with concern for hypoglycemia.  Patient has history of lung cancer, scheduled for outpatient PET scan today.  States he was instructed to be n.p.o. overnight.  States his last meal was yesterday around 6 PM.  Takes insulin as needed, does not think he took any last night.  Did not take any this morning.  While getting checked in for the CAT scan, felt somewhat weak, his sugar was 23.  Staff recommended he go to ER for further eval.  At time of my assessment, patient states he has no ongoing complaints, denies any loss of consciousness, no associated chest pain.  States he was planning to go out for lunch after his PET scan with his wife and requesting to be discharged.  HPI     Past Medical History:  Diagnosis Date  . Barrett's esophagus 07/01/2013   Without dysplasia on biopsy 09/03/2012. Repeat EGD recommended 08/2015  . Bilateral cataracts 02/13/2017  . Carotid artery stenosis 07/01/2013   Requiring right sided stent   . Chronic pain syndrome 07/01/2013  . Closed head injury with brief loss of consciousness ([place]) 07/22/2010   Head trapped in a hydraulic device at work.  Fracture of orbital bones on right and brief loss of consciousness per report.  . 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 There was likely a strong psycho-social overlay in regard to the cognitive deficits in the form of mood disorder with psychotic features and mixed anxiety symptomatology. His primary tested cognitive deficits are in the areas of attention, executi  . Daily headache "since 07/2010"   constantly  . Degenerative joint disease of  cervical spine 07/01/2013  . Dupuytren's contracture of both hands 04/08/2014  . Encounter for antineoplastic chemotherapy 10/05/2015  . Encounter for antineoplastic immunotherapy 05/24/2016  . Erectile dysfunction associated with type 2 diabetes mellitus ([place]) 07/01/2013  . Fibromyalgia 07/01/2013  . Goals of care, counseling/discussion 03/28/2016  . History of blood transfusion 11/28/2015   "suppose to get his first today" (11/28/2015)  . Hyperlipidemia LDL goal < 100 07/01/2013  . Intractable hiccups 04/11/2016  . 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 Memory changes    "memory issues" from head injury  . Moderate protein-calorie malnutrition ([place]) 11/29/2015  . Osteoarthritis of right thumb 10/21/2014  . Peripheral vascular occlusive disease ([place]) 07/01/2013   Requiring 2 arterial stents above the left knee per report  . Pneumonia ~ 2006/2007  . Post traumatic stress disorder 07/01/2013  . Primary lung adenocarcinoma ([place]) dx'd 08/2015   "right lung"  . Severe major depression with psychotic features ([place]) 04/15/2011  . Tobacco abuse 07/01/2013  . Type 2 diabetes mellitus with vascular disease ([place]) 07/01/2013   Left lower extremity and right carotid stenting    Patient Active Problem List   Diagnosis Date Noted  . Encounter for antineoplastic chemotherapy 12/31/2018  . Pressure injury of skin 12/07/2018  . Hypotension 12/06/2018  . Peripheral edema   . Port-A-Cath in place 05/28/2018  . Diarrhea 05/28/2018  . Neuropathy 05/16/2017  . Bilateral cataracts 02/13/2017  . Constipation due to opioid therapy 07/05/2016  . Encounter for antineoplastic immunotherapy 05/24/2016  .  Goals of care, counseling/discussion 03/28/2016  . 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 Moderate protein-calorie malnutrition ([place]) 11/29/2015  . Primary adenocarcinoma of right lung ([place]) 08/25/2015  . Osteoarthritis of right thumb 10/21/2014  . Dupuytren's contracture of both hands 04/08/2014  . Healthcare  maintenance 10/08/2013  . Post traumatic stress disorder 07/02/2013  . Type 2 diabetes mellitus with vascular disease ([place]) 07/01/2013  . Peripheral vascular occlusive disease ([place]) 07/01/2013  . 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 Erectile dysfunction associated with type 2 diabetes mellitus ([place]) 07/01/2013  . Hyperlipidemia 07/01/2013  . Tobacco abuse 07/01/2013  . Fibromyalgia 07/01/2013  . Chronic pain syndrome 07/01/2013  . Cognitive disorder 04/15/2011  . Closed head injury with brief loss of consciousness ([place]) 08/10/2010    Past Surgical History:  Procedure Laterality Date  . CAROTID STENT Right ?2014  . COLONOSCOPY N/A 11/30/2015   Procedure: COLONOSCOPY;  Surgeon: Teena Irani, MD;  Location: [facility] ENDOSCOPY;  Service: Endoscopy;  Laterality: N/A;  . ESOPHAGOGASTRODUODENOSCOPY N/A 11/30/2015   Procedure: ESOPHAGOGASTRODUODENOSCOPY (EGD);  Surgeon: Teena Irani, MD;  Location: [facility] ENDOSCOPY;  Service: Endoscopy;  Laterality: N/A;  . ESOPHAGOGASTRODUODENOSCOPY (EGD) WITH PROPOFOL N/A 01/05/2016   Procedure: ESOPHAGOGASTRODUODENOSCOPY (EGD) WITH PROPOFOL;  Surgeon: Teena Irani, MD;  Location: [facility] ENDOSCOPY;  Service: Endoscopy;  Laterality: N/A;  . FEMORAL ARTERY STENT Left 05/2012; ~ 2015   Archie Endo 06/04/2012; Raechel Chute report  . FRACTURE SURGERY    . GIVENS CAPSULE STUDY N/A 12/22/2015   Procedure: GIVENS CAPSULE STUDY;  Surgeon: Wonda Horner, MD;  Location: [facility] ENDOSCOPY;  Service: Endoscopy;  Laterality: N/A;  . HARDWARE REMOVAL Right 11/15/2011   Removal of deep frontozygomatic orbital hardware/notes 11/15/2011  . HERNIA REPAIR  [id number]   Umbilical  . LAPAROSCOPY N/A 02/27/2016   Procedure: LAPAROSCOPY, LAPAROTOMY  WITH TWO SMALL BOWEL RESECTION;  Surgeon: Johnathan Hausen, MD;  Location: [facility] ORS;  Service: General;  Laterality: N/A;  . ORIF ORBITAL FRACTURE Right 08/15/2010    caught in a hydraulic machine; open reduction internal fixation of orbital rim  fracture and open reduction of zygomatic arch fracture  Archie Endo 10/13/2009  . PORTACATH PLACEMENT Left 04/04/2016   Procedure: INSERTION PORT-A-CATH LEFT CHEST;  Surgeon: Melrose Nakayama, MD;  Location: [facility];  Service: Thoracic;  Laterality: Left;  Marland Kitchen VIDEO ASSISTED THORACOSCOPY (VATS)/ LOBECTOMY Right 10/18/2015   Procedure: VIDEO ASSISTED THORACOSCOPY (VATS)/ LOBECTOMY;  Surgeon: Melrose Nakayama, MD;  Location: [facility];  Service: Thoracic;  Laterality: Right;  Marland Kitchen VIDEO BRONCHOSCOPY Bilateral 09/21/2015   Procedure: VIDEO BRONCHOSCOPY WITH FLUORO;  Surgeon: Juanito Doom, MD;  Location: [facility] ENDOSCOPY;  Service: Cardiopulmonary;  Laterality: Bilateral;       Family History  Problem Relation Age of Onset  . Heart disease Mother   . Diabetes Mother   . Diabetes Father   . Heart attack Father   . Diabetes Sister   . Coronary artery disease Sister        s/p CABG  . Diabetes Brother   . Healthy Daughter   . Healthy Son   . Diabetes Sister   . Healthy Sister   . Healthy Sister   . Diabetes Brother   . Coronary artery disease Brother        s/p CABG  . Diabetes Brother   . Healthy Brother   . Healthy Brother   . Healthy Daughter     Social History   Tobacco Use  . Smoking status: Current Every Day  Smoker    Packs/day: 1.00    Years: 35.00    Pack years: 35.00    Types: Cigarettes    Last attempt to quit: 10/18/2015    Years since quitting: 3.2  . Smokeless tobacco: Never Used  . Tobacco comment: .5PPD  Substance Use Topics  . Alcohol use: Yes    Alcohol/week: 0.0 standard drinks    Comment: Rarely.  . Drug use: No    Home Medications Prior to Admission medications   Medication Sig Start Date End Date Taking? Authorizing Provider  ACCU-CHEK SOFTCLIX LANCETS lancets Use to test blood glucose 1-2 times daily. Dx Code E11.59 10/03/17   [name], MD  aspirin EC 81 MG tablet Take 81 mg by mouth daily.     [provider]  atorvastatin (LIPITOR) 40 MG  tablet Take 1 tablet (40 mg total) by mouth daily. 01/09/18 01/26/19  [name], MD  Blood Glucose Monitoring Suppl (ACCU-CHEK AVIVA PLUS) w/Device KIT Use to test blood glucose 1-2 times daily. Dx Code E11.59 04/06/18   [name], MD  dexamethasone (DECADRON) 4 MG tablet 4 mg p.o. twice daily the day before, day of and day after chemotherapy every 3 weeks. Patient taking differently: Take 4 mg by mouth See admin instructions. Take 1 tablet. twice daily the day before, day of and day after chemotherapy every 3 weeks. 11/12/18   Curt Bears, MD  glipiZIDE (GLUCOTROL) 10 MG tablet Take 1 tablet (10 mg total) by mouth 2 (two) times daily before a meal. 12/08/17   [name], MD  glucose blood test strip USE TO TEST BLOOD GLUCOSE 1 TO 2 TIMES DAILY 03/30/18   [name], MD  insulin aspart (NOVOLOG) 100 UNIT/ML FlexPen Inject 1-9 units into the skin with your 2 biggest meals  based on the sliding scale provided to you. 12/10/18   Santos-Sanchez, Merlene Morse, MD  Insulin Glargine (LANTUS) 100 UNIT/ML Solostar Pen Inject 5 Units into the skin daily. 10/23/18   Sid Falcon, MD  Insulin Pen Needle (PEN NEEDLES) 32G X 4 MM MISC 1 pen by Does not apply route 2 (two) times daily. Dx Code E11.59 12/10/18   Welford Roche, MD  levothyroxine (SYNTHROID) 50 MCG tablet Take 1 tablet (50 mcg total) by mouth daily before breakfast. 12/18/18   Heilingoetter, Cassandra L, PA-C  oxyCODONE-acetaminophen (PERCOCET) 10-325 MG tablet Take 1 tablet by mouth every 6 (six) hours as needed for pain. 11/13/18 11/13/19  Sid Falcon, MD  oxyCODONE-acetaminophen (PERCOCET) 10-325 MG tablet Take 1-2 tablets by mouth every 6 (six) hours as needed for pain. 01/13/19   Sid Falcon, MD  pantoprazole (PROTONIX) 40 MG tablet TAKE 1 TABLET(40 MG) BY MOUTH DAILY Patient taking differently: Take 40 mg by mouth daily.  11/27/18   Sid Falcon, MD  predniSONE (DELTASONE) 50 MG tablet Take one tablet 13 hours  , one tablet 7 hours and one tablet 1 hour prior to CT scan. 01/13/19   Curt Bears, MD  prochlorperazine (COMPAZINE) 10 MG tablet Take 1 tablet (10 mg total) by mouth every 6 (six) hours as needed for nausea or vomiting. 12/29/18   Sid Falcon, MD  sitaGLIPtin (JANUVIA) 100 MG tablet Take 1 tablet (100 mg total) by mouth daily. 01/09/18   [name], MD  predniSONE (DELTASONE) 50 MG tablet TAKE 1 TABLET BY MOUTH AT 13 HOURS, 7 HOURS, AND 1 HOUR PRIOR TO SCAN 05/28/18   Heilingoetter, Cassandra L, PA-C    Allergies  Gabapentin, Lyrica [pregabalin], Jardiance [empagliflozin], Celebrex [celecoxib], and Contrast media [iodinated diagnostic agents]  Review of Systems   Review of Systems  Constitutional: Negative for chills and fever.  HENT: Negative for ear pain and sore throat.   Eyes: Negative for pain and visual disturbance.  Respiratory: Negative for cough and shortness of breath.   Cardiovascular: Negative for chest pain and palpitations.  Gastrointestinal: Negative for abdominal pain and vomiting.  Genitourinary: Negative for dysuria and hematuria.  Musculoskeletal: Negative for arthralgias and back pain.  Skin: Negative for color change and rash.  Neurological: Negative for seizures and syncope.  All other systems reviewed and are negative.   Physical Exam Updated Vital Signs BP 104/81 (BP Location: Left Arm)   Pulse (!) 106   Temp 97.7 F (36.5 C) (Oral)   Resp 14   SpO2 100%   Physical Exam Vitals and nursing note reviewed.  Constitutional:      Appearance: He is well-developed.     Comments: Chronically ill-appearing  HENT:     Head: Normocephalic and atraumatic.  Eyes:     Conjunctiva/sclera: Conjunctivae normal.  Cardiovascular:     Rate and Rhythm: Normal rate and regular rhythm.     Heart sounds: No murmur.  Pulmonary:     Effort: Pulmonary effort is normal. No respiratory distress.     Breath sounds: Normal breath sounds.  Abdominal:      Palpations: Abdomen is soft.     Tenderness: There is no abdominal tenderness.  Musculoskeletal:     Cervical back: Neck supple.  Skin:    General: Skin is warm and dry.  Neurological:     General: No focal deficit present.     Mental Status: He is alert and oriented to person, place, and time.  Psychiatric:        Mood and Affect: Mood normal.        Behavior: Behavior normal.     ED Results / Procedures / Treatments   Labs (all labs ordered are listed, but only abnormal results are displayed) Labs Reviewed  CBG MONITORING, ED - Abnormal; Notable for the following components:      Result Value   Glucose-Capillary 56 (*)    All other components within normal limits  CBG MONITORING, ED    EKG None  Radiology No results found.  Procedures Procedures (including critical care time)  Medications Ordered in ED Medications  heparin lock flush 100 UNIT/ML injection (has no administration in time range)  heparin lock flush 100 unit/mL (500 Units Intracatheter Given 01/29/19 1313)    ED Course  I have reviewed the triage vital signs and the nursing notes.  Pertinent labs & imaging results that were available during my care of the patient were reviewed by me and considered in my medical decision making (see chart for details).  Clinical Course as of Jan 29 1348  Fri Jan 29, 2019  1304 No symptoms at this time, request DC home, glucose normal, tolerating p.o. without difficulty, reviewed return precautions, will discharge   [RD]    Clinical Course User Index [RD] Lucrezia Starch, MD   MDM Rules/Calculators/A&P                     59 year old male stage IV non-small cell lung cancer presented to ER after noted to be hypoglycemic prior to outpatient PET scan today.  On arrival in ER, he has no ongoing symptoms.  Recheck glucose was 56, subsequent recheck after additional  p.o. was 95.  Recommended patient further observation, checking some basic labs, kidney function,  electrolytes.  Patient states he has no ongoing symptoms and strongly desires to go home without any additional work-up.  Given his glucose is improving, will discharge home.  Suspect this is related to his poor p.o. intake in anticipation for the PET scan.  Recommended that he call his primary doctor this afternoon to discuss his insulin regimen, reviewed need for frequent glucose checks and recommended holding any additional doses of insulin today.    After the discussed management above, the patient was determined to be safe for discharge.  The patient was in agreement with this plan and all questions regarding their care were answered.  ED return precautions were discussed and the patient will return to the ED with any significant worsening of condition.   Final Clinical Impression(s) / ED Diagnoses Final diagnoses:  Hypoglycemia    Rx / DC Orders ED Discharge Orders    None       Lucrezia Starch, MD 01/29/19 1356

## 2019-01-29 NOTE — Progress Notes (Signed)
RRT called to PET scan to assist with hypoglycemic event. When arrived to room, patient A&Ox4 c/o weakness. Per PET scan staff, CBG was 23 (they took CBG twice to ensure accuracy). Patient reported he last ate dinner 1/7 (chicken and water). He has not eaten anything today and did not take his medications this morning.   Dr. Jobe Igo arrived to room, verbalized to give patient orange juice and crackers. Goal to get CBG >70. Patient drank about 300 cc orange juice at 1125.PET scan staff to recheck CBG at 1140 and notify RRT if CBG <70.   VS- 107/70, spo2 100% RA, HR 85.

## 2019-01-29 NOTE — ED Notes (Signed)
CBG 53

## 2019-01-29 NOTE — Discharge Instructions (Addendum)
Recommend calling your primary doctor for close follow-up regarding this episode of low blood sugar.  Recommend holding insulin tonight unless otherwise instructed by your primary doctor.  Recommend frequent glucose checks at home as discussed.  Recommend resuming a regular diet today.  Please follow-up with your oncologist to make sure you have your PET scan rescheduled.  Return to ER if you develop any weakness, episodes of passing out or other new concerning symptom.

## 2019-01-29 NOTE — Telephone Encounter (Signed)
I called and spoke to the staff a the [facility] Radiology staff regarding this patient. Critical blood glucose levels of 24 and 23 were noted when the patient went to have his PET scan performed today. They reportedly gave him food to eat and drink. Recommended that if his blood sugar needs to be rechecked. If his blood sugar fails to improve >70, the patient needs to go to the emergency room. The patient's wife reportedly stated to the radiology staff that he did not take his diabetes medications today. Will reach out to his wife as well to speak to her as well about symptoms and monitoring his blood sugar closer at home.

## 2019-02-02 ENCOUNTER — Inpatient Hospital Stay: Payer: Medicare HMO | Attending: Internal Medicine | Admitting: Internal Medicine

## 2019-02-02 ENCOUNTER — Encounter: Payer: Self-pay | Admitting: Internal Medicine

## 2019-02-02 ENCOUNTER — Encounter (HOSPITAL_COMMUNITY): Payer: Self-pay | Admitting: Radiology

## 2019-02-02 ENCOUNTER — Other Ambulatory Visit: Payer: Self-pay

## 2019-02-02 ENCOUNTER — Ambulatory Visit (HOSPITAL_COMMUNITY)
Admission: RE | Admit: 2019-02-02 | Discharge: 2019-02-02 | Disposition: A | Discharge status: Home / Self Care | Payer: Medicare HMO | Source: Ambulatory Visit | Attending: Internal Medicine | Admitting: Internal Medicine

## 2019-02-02 VITALS — BP 110/80 | HR 119 | Temp 97.8°F | Resp 20 | Ht 67.0 in | Wt 97.1 lb

## 2019-02-02 DIAGNOSIS — E860 Dehydration: Secondary | ICD-10-CM | POA: Insufficient documentation

## 2019-02-02 DIAGNOSIS — C3491 Malignant neoplasm of unspecified part of right bronchus or lung: Secondary | ICD-10-CM | POA: Diagnosis not present

## 2019-02-02 DIAGNOSIS — Z720 Tobacco use: Secondary | ICD-10-CM

## 2019-02-02 DIAGNOSIS — G629 Polyneuropathy, unspecified: Secondary | ICD-10-CM | POA: Diagnosis not present

## 2019-02-02 DIAGNOSIS — C171 Malignant neoplasm of jejunum: Secondary | ICD-10-CM

## 2019-02-02 DIAGNOSIS — C3490 Malignant neoplasm of unspecified part of unspecified bronchus or lung: Secondary | ICD-10-CM | POA: Insufficient documentation

## 2019-02-02 DIAGNOSIS — R197 Diarrhea, unspecified: Secondary | ICD-10-CM | POA: Insufficient documentation

## 2019-02-02 DIAGNOSIS — C3411 Malignant neoplasm of upper lobe, right bronchus or lung: Secondary | ICD-10-CM | POA: Insufficient documentation

## 2019-02-02 DIAGNOSIS — R599 Enlarged lymph nodes, unspecified: Secondary | ICD-10-CM | POA: Diagnosis not present

## 2019-02-02 DIAGNOSIS — R110 Nausea: Secondary | ICD-10-CM | POA: Insufficient documentation

## 2019-02-02 DIAGNOSIS — M545 Low back pain: Secondary | ICD-10-CM | POA: Insufficient documentation

## 2019-02-02 DIAGNOSIS — E440 Moderate protein-calorie malnutrition: Secondary | ICD-10-CM

## 2019-02-02 DIAGNOSIS — Z79899 Other long term (current) drug therapy: Secondary | ICD-10-CM | POA: Insufficient documentation

## 2019-02-02 DIAGNOSIS — K760 Fatty (change of) liver, not elsewhere classified: Secondary | ICD-10-CM | POA: Insufficient documentation

## 2019-02-02 DIAGNOSIS — Z452 Encounter for adjustment and management of vascular access device: Secondary | ICD-10-CM | POA: Insufficient documentation

## 2019-02-02 DIAGNOSIS — R634 Abnormal weight loss: Secondary | ICD-10-CM | POA: Insufficient documentation

## 2019-02-02 DIAGNOSIS — E119 Type 2 diabetes mellitus without complications: Secondary | ICD-10-CM | POA: Diagnosis not present

## 2019-02-02 DIAGNOSIS — I959 Hypotension, unspecified: Secondary | ICD-10-CM | POA: Insufficient documentation

## 2019-02-02 DIAGNOSIS — Z5111 Encounter for antineoplastic chemotherapy: Secondary | ICD-10-CM | POA: Insufficient documentation

## 2019-02-02 DIAGNOSIS — C784 Secondary malignant neoplasm of small intestine: Secondary | ICD-10-CM | POA: Insufficient documentation

## 2019-02-02 LAB — GLUCOSE, CAPILLARY: Glucose-Capillary: 127 mg/dL — ABNORMAL HIGH (ref 70–99)

## 2019-02-02 MED ORDER — FLUDEOXYGLUCOSE F - 18 (FDG) INJECTION
4.9700 | Freq: Once | INTRAVENOUS | Status: AC | PRN
  Administered 2019-02-02: 4.97 via INTRAVENOUS

## 2019-02-02 NOTE — Progress Notes (Signed)
Brett [name] Male, 59 y.o., 08/10/1960 MRN:  [id number] Phone:  [phone] (H) ... PCP:  Sid Falcon, MD Primary Cvg:  [employer] Next Appt With Radiology ([facility]-CT 1) 02/10/2019 at 11:00 AM  RE: CT Biopsy Received: Today Message Contents  Arne Cleveland, MD  Arlyn Leak   CT core L retroperitoneal LAN   [facility]       Previous Messages   ----- Message -----  From: Garth Bigness D  Sent: 02/02/2019  3:15 PM EST  To: Ir Procedure Requests  Subject: CT Biopsy                     Procedure:  CT Biopsy   Reason: Primary adenocarcinoma of right lung, CT-guided core biopsy of one of the hypermetabolic abdominal lymph nodes. Not interested in the left supraclavicular lymph node at this point    History: CT, NM PET in computer   Provider: Curt Bears   Contact: [phone]

## 2019-02-02 NOTE — Progress Notes (Signed)
[place] [phone]   Fax:([phone]) [phone]  OFFICE PROGRESS NOTE  Sid Falcon, MD [place] [address]  DIAGNOSIS: Stage IV (T1b, N0, M1b) non-small cell lung cancer, adenocarcinoma presented with right upper lobe lung nodule and recent metastasis to the small intestine. This was initially diagnosed in September 2017.  Genomic Alterations Identified? ERBB2 amplification - equivocal? CDKN2A p16INK4a E88* and p14ARF F790X SMARCA4 splice site [id number]>YO SPTA1 E2022* TOP2A amplification TP53 A159P Additional Findings? Microsatellite status MS-Stable Tumor Mutation Burden TMB-Intermediate; 18 Muts/Mb Additional Disease-relevant Genes with No Reportable Alterations Identified? EGFR KRAS ALK BRAF MET RET ROS1   PRIOR THERAPY:  1) Status post right VATS with right upper lobectomy and mediastinal lymph node dissection under the care of Dr. Roxan Hockey on 10/18/2015 and the final pathology was consistent with stage IA (T1b, N0, MX). 2) upper endoscopy on 01/05/2016 showed normal esophagus, normal stomach but there was occasional mass around 3.0 CM in length circumferential nonobstructing in the jejunum. The final pathology was consistent with metastatic adenocarcinoma. 3) status post laparoscopic laparotomy and resection of proximal lesion and and distal jejunum/proximal ileum under the care of Dr. Hassell Done 1 02/27/2016. 3)  Systemic chemotherapy with carboplatin for AUC of 5 and Alimta 500 MG/M2 every 3 weeks. First dose 04/04/2016. Status post 2 cycles. Last dose was given 04/21/2016 discontinued secondary to disease progression. 4) Second line immunotherapy with Ketruda 200 mg IV every 2 weeks, first dose 05/30/2016. Status post 40 cycles.  Last dose October 22, 2018.  Discontinued today secondary to disease progression. 5)  Systemic chemotherapy with docetaxel 75 mg/M2 and Cyramza 10 mg/KG every 3 weeks.  First dose November 19, 2018.  This treatment was discontinued secondary to disease progression.  CURRENT THERAPY: None.   INTERVAL HISTORY: Brett [name] 59 y.o. male returns to the clinic today for follow-up visit.  The patient continues to complain of back pain.  He is currently on pain medication with Percocet with some control of his pain.  He also lost few more pounds since his last visit.  He was hypoglycemic last week when he went to have a PET scan and that was rescheduled.  The patient does not monitor his blood glucose closely at home.  He denied having any current chest pain, shortness of breath, cough or hemoptysis.  He denied having any fever or chills.  Has occasional nausea with no vomiting and no diarrhea or constipation.  He has no headache or visual changes.  He had a PET scan performed recently and he is here for evaluation and discussion of the PET scan results and further recommendation regarding his condition.   MEDICAL HISTORY: Past Medical History:  Diagnosis Date  . Barrett's esophagus 07/01/2013   Without dysplasia on biopsy 09/03/2012. Repeat EGD recommended 08/2015  . Bilateral cataracts 02/13/2017  . Carotid artery stenosis 07/01/2013   Requiring right sided stent   . Chronic pain syndrome 07/01/2013  . Closed head injury with brief loss of consciousness ([place]) 07/22/2010   Head trapped in a hydraulic device at work.  Fracture of orbital bones on right and brief loss of consciousness per report.  . 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 There was likely a strong psycho-social overlay in regard to the cognitive deficits in the form of mood disorder with psychotic features and mixed anxiety  symptomatology. His primary tested cognitive deficits are in the areas of attention, executi  . Daily headache "since 07/2010"   constantly  . Degenerative joint  disease of cervical spine 07/01/2013  . Dupuytren's contracture of both hands 04/08/2014  . Encounter for antineoplastic chemotherapy 10/05/2015  . Encounter for antineoplastic immunotherapy 05/24/2016  . Erectile dysfunction associated with type 2 diabetes mellitus ([place]) 07/01/2013  . Fibromyalgia 07/01/2013  . Goals of care, counseling/discussion 03/28/2016  . History of blood transfusion 11/28/2015   "suppose to get his first today" (11/28/2015)  . Hyperlipidemia LDL goal < 100 07/01/2013  . Intractable hiccups 04/11/2016  . 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 Memory changes    "memory issues" from head injury  . Moderate protein-calorie malnutrition ([place]) 11/29/2015  . Osteoarthritis of right thumb 10/21/2014  . Peripheral vascular occlusive disease ([place]) 07/01/2013   Requiring 2 arterial stents above the left knee per report  . Pneumonia ~ 2006/2007  . Post traumatic stress disorder 07/01/2013  . Primary lung adenocarcinoma ([place]) dx'd 08/2015   "right lung"  . Severe major depression with psychotic features ([place]) 04/15/2011  . Tobacco abuse 07/01/2013  . Type 2 diabetes mellitus with vascular disease ([place]) 07/01/2013   Left lower extremity and right carotid stenting    ALLERGIES:  is allergic to gabapentin; lyrica [pregabalin]; jardiance [empagliflozin]; celebrex [celecoxib]; and contrast media [iodinated diagnostic agents].  MEDICATIONS:  Current Outpatient Medications  Medication Sig Dispense Refill  . ACCU-CHEK SOFTCLIX LANCETS lancets Use to test blood glucose 1-2 times daily. Dx Code E11.59 100 each 12  . aspirin EC 81 MG tablet Take 81 mg by mouth daily.     . Blood Glucose Monitoring Suppl (ACCU-CHEK AVIVA PLUS) w/Device KIT Use to test blood glucose 1-2 times daily. Dx Code E11.59 1 kit 0  . dexamethasone (DECADRON) 4 MG tablet 4 mg p.o. twice daily the day before, day of and day after chemotherapy every 3 weeks. (Patient taking differently: Take 4 mg by mouth See admin  instructions. Take 1 tablet. twice daily the day before, day of and day after chemotherapy every 3 weeks.) 40 tablet 1  . glipiZIDE (GLUCOTROL) 10 MG tablet Take 1 tablet (10 mg total) by mouth 2 (two) times daily before a meal. 180 tablet 3  . glucose blood test strip USE TO TEST BLOOD GLUCOSE 1 TO 2 TIMES DAILY 100 each 4  . insulin aspart (NOVOLOG) 100 UNIT/ML FlexPen Inject 1-9 units into the skin with your 2 biggest meals  based on the sliding scale provided to you. 5 pen 3  . Insulin Glargine (LANTUS) 100 UNIT/ML Solostar Pen Inject 5 Units into the skin daily. 15 mL 3  . Insulin Pen Needle (PEN NEEDLES) 32G X 4 MM MISC 1 pen by Does not apply route 2 (two) times daily. Dx Code E11.59 200 each 6  . levothyroxine (SYNTHROID) 50 MCG tablet Take 1 tablet (50 mcg total) by mouth daily before breakfast. 30 tablet 2  . oxyCODONE-acetaminophen (PERCOCET) 10-325 MG tablet Take 1 tablet by mouth every 6 (six) hours as needed for pain. 30 tablet 0  . oxyCODONE-acetaminophen (PERCOCET) 10-325 MG tablet Take 1-2 tablets by mouth every 6 (six) hours as needed for pain. 240 tablet 0  . pantoprazole (PROTONIX) 40 MG tablet TAKE 1 TABLET(40 MG) BY MOUTH DAILY (Patient taking differently: Take 40 mg by mouth daily. ) 90 tablet 1  . predniSONE (DELTASONE) 50 MG tablet Take one tablet 13 hours , one tablet 7 hours  and one tablet 1 hour prior to CT scan. 3 tablet 3  . prochlorperazine (COMPAZINE) 10 MG tablet Take 1 tablet (10 mg total) by mouth every 6 (six) hours as needed for nausea or vomiting. 30 tablet 5  . sitaGLIPtin (JANUVIA) 100 MG tablet Take 1 tablet (100 mg total) by mouth daily. 90 tablet 3  . atorvastatin (LIPITOR) 40 MG tablet Take 1 tablet (40 mg total) by mouth daily. 90 tablet 3   No current facility-administered medications for this visit.    SURGICAL HISTORY:  Past Surgical History:  Procedure Laterality Date  . CAROTID STENT Right ?2014  . COLONOSCOPY N/A 11/30/2015   Procedure:  COLONOSCOPY;  Surgeon: Teena Irani, MD;  Location[facility] ENDOSCOPY;  Service: Endoscopy;  Laterality: N/A;  . ESOPHAGOGASTRODUODENOSCOPY N/A 11/30/2015   Procedure: ESOPHAGOGASTRODUODENOSCOPY (EGD);  Surgeon: Teena Irani, MD;  Location[facility] ENDOSCOPY;  Service: Endoscopy;  Laterality: N/A;  . ESOPHAGOGASTRODUODENOSCOPY (EGD) WITH PROPOFOL N/A 01/05/2016   Procedure: ESOPHAGOGASTRODUODENOSCOPY (EGD) WITH PROPOFOL;  Surgeon: Teena Irani, MD;  Location: [facility] ENDOSCOPY;  Service: Endoscopy;  Laterality: N/A;  . FEMORAL ARTERY STENT Left 05/2012; ~ 2015   Archie Endo 06/04/2012; Raechel Chute report  . FRACTURE SURGERY    . GIVENS CAPSULE STUDY N/A 12/22/2015   Procedure: GIVENS CAPSULE STUDY;  Surgeon: Wonda Horner, MD;  Location: [facility] ENDOSCOPY;  Service: Endoscopy;  Laterality: N/A;  . HARDWARE REMOVAL Right 11/15/2011   Removal of deep frontozygomatic orbital hardware/notes 11/15/2011  . HERNIA REPAIR  [id number]   Umbilical  . LAPAROSCOPY N/A 02/27/2016   Procedure: LAPAROSCOPY, LAPAROTOMY  WITH TWO SMALL BOWEL RESECTION;  Surgeon: Johnathan Hausen, MD;  Location: [facility] ORS;  Service: General;  Laterality: N/A;  . ORIF ORBITAL FRACTURE Right 08/15/2010    caught in a hydraulic machine; open reduction internal fixation of orbital rim fracture and open reduction of zygomatic arch fracture  Archie Endo 10/13/2009  . PORTACATH PLACEMENT Left 04/04/2016   Procedure: INSERTION PORT-A-CATH LEFT CHEST;  Surgeon: Melrose Nakayama, MD;  Location: [place];  Service: Thoracic;  Laterality: Left;  Marland Kitchen VIDEO ASSISTED THORACOSCOPY (VATS)/ LOBECTOMY Right 10/18/2015   Procedure: VIDEO ASSISTED THORACOSCOPY (VATS)/ LOBECTOMY;  Surgeon: Melrose Nakayama, MD;  Location: [place];  Service: Thoracic;  Laterality: Right;  Marland Kitchen VIDEO BRONCHOSCOPY Bilateral 09/21/2015   Procedure: VIDEO BRONCHOSCOPY WITH FLUORO;  Surgeon: Juanito Doom, MD;  Location: [facility] ENDOSCOPY;  Service: Cardiopulmonary;  Laterality: Bilateral;    REVIEW OF SYSTEMS:  Constitutional:  positive for fatigue and weight loss Eyes: negative Ears, nose, mouth, throat, and face: negative Respiratory: negative Cardiovascular: negative Gastrointestinal: negative Genitourinary:negative Integument/breast: negative Hematologic/lymphatic: negative Musculoskeletal:positive for back pain Neurological: negative Behavioral/Psych: negative Endocrine: negative Allergic/Immunologic: negative   PHYSICAL EXAMINATION: General appearance: alert, cooperative, cachectic, fatigued and no distress Head: Normocephalic, without obvious abnormality, atraumatic Neck: no adenopathy, no JVD, supple, symmetrical, trachea midline and thyroid not enlarged, symmetric, no tenderness/mass/nodules Lymph nodes: Cervical, supraclavicular, and axillary nodes normal. Resp: clear to auscultation bilaterally Back: symmetric, no curvature. ROM normal. No CVA tenderness. Cardio: regular rate and rhythm, S1, S2 normal, no murmur, click, rub or gallop GI: soft, non-tender; bowel sounds normal; no masses,  no organomegaly Extremities: extremities normal, atraumatic, no cyanosis or edema Neurologic: Alert and oriented X 3, normal strength and tone. Normal symmetric reflexes. Normal coordination and gait  ECOG PERFORMANCE STATUS: 1 - Symptomatic but completely ambulatory  Blood pressure 110/80, pulse (!) 119, temperature 97.8 F (36.6 C), temperature source Temporal, resp. rate 20, height 5' 7"  (  1.702 m), weight 97 lb 1.6 oz (44 kg), SpO2 100 %.  LABORATORY DATA: Lab Results  Component Value Date   WBC 17.0 (H) 01/19/2019   HGB 11.8 (L) 01/19/2019   HCT 35.8 (L) 01/19/2019   MCV 96.0 01/19/2019   PLT 312 01/19/2019      Chemistry      Component Value Date/Time   NA 136 01/19/2019 1342   NA 140 01/23/2017 0910   K 4.4 01/19/2019 1342   K 4.5 01/23/2017 0910   CL 101 01/19/2019 1342   CO2 27 01/19/2019 1342   CO2 28 01/23/2017 0910   BUN 8 01/19/2019 1342   BUN 8.1 01/23/2017 0910   CREATININE 0.44  (L) 01/19/2019 1342   CREATININE 0.8 01/23/2017 0910   GLU 398 (H) 08/08/2016 1257      Component Value Date/Time   CALCIUM 8.3 (L) 01/19/2019 1342   CALCIUM 9.6 01/23/2017 0910   ALKPHOS 141 (H) 01/19/2019 1342   ALKPHOS 113 01/23/2017 0910   AST 38 01/19/2019 1342   AST 11 01/23/2017 0910   ALT 25 01/19/2019 1342   ALT 12 01/23/2017 0910   BILITOT 0.7 01/19/2019 1342   BILITOT 0.40 01/23/2017 0910       RADIOGRAPHIC STUDIES: CT Chest W Contrast  Result Date: 01/18/2019 CLINICAL DATA:  RIGHT lung cancer. Jejunal cancer. Chemotherapy ongoing. Diffuse abdominal pain. EXAM: CT CHEST, ABDOMEN, AND PELVIS WITH CONTRAST TECHNIQUE: Multidetector CT imaging of the chest, abdomen and pelvis was performed following the standard protocol during bolus administration of intravenous contrast. CONTRAST:  5m OMNIPAQUE IOHEXOL 300 MG/ML  SOLN COMPARISON:  CT abdomen 12/08/2018, chest CT 11/10/2018 FINDINGS: CT CHEST FINDINGS Cardiovascular: No significant vascular findings. Normal heart size. No pericardial effusion. Coronary artery calcification and aortic atherosclerotic calcification. Mediastinum/Nodes: No axillary supraclavicular adenopathy. Increase in size of low-density lymph node in the high mediastinum (thoracic inlet) measuring 1.6 cm (image 16/2). No pericardial effusion. Esophagus normal. Lungs/Pleura: New irregular nodule in the RIGHT lower lobe measures 10 mm (image 122/6). Less well-defined peribronchial thickening in the LEFT upper lobe on image 78/6) Musculoskeletal: No aggressive osseous lesion. CT ABDOMEN AND PELVIS FINDINGS Hepatobiliary: Low-attenuation liver. Enhancing lesion in the inferior inferior LEFT hepatic lobe adjacent to the gallbladder fossa measuring 11 mm on image 64/2. Pancreas: Pancreas is normal. No ductal dilatation. No pancreatic inflammation. Spleen: Normal spleen Adrenals/urinary tract: Adrenal glands and kidneys are normal. The ureters and bladder normal.  Stomach/Bowel: Medications in the stomach. Duodenum and small-bowel normal. There is anastomosis in the small bowel. Terminal ileum normal. Ascending, transverse and descending colon normal. Rectosigmoid colon normal. Vascular/Lymphatic: Calcification abdominal aorta. Interval increase in size of low-density periaortic retroperitoneal lymphadenopathy. For example 3.6 cm nodal mass adjacent to the LEFT kidney compares to 2.7 cm on CT 12/08/2018. Lymph node complex deep to the IVC at the same level measures 3.2 cm (image 73/2) compared to 2.9 cm. Nodal implant within the central mesentery measuring 2.5 by 1.6 cm (image 73/2) increased from 2.5 x 1.1 cm. Reproductive: Prostate normal. Other: No free fluid. Musculoskeletal: No aggressive osseous lesion. IMPRESSION: Chest Impression: 1. New nodularity in the RIGHT lower lobe is concerning for metastatic lesion. Focus of pulmonary infection would be secondary differential. Recommend close attention on follow-up. 2. Peribronchial thickening in the LEFT upper lobe warrants attention on follow-up. 3. Metastatic lymph node in the thoracic inlet/prevascular space. Abdomen / Pelvis Impression: 1. Interval increase in size of bulky low-density periaortic retroperitoneal lymphadenopathy. 2. Interval increase in size  of mesenteric nodal implant. 3. Enhancing lesion in the inferior LEFT hepatic lobe. Differential includes hepatic metastasis versus focal fatty sparing. Location favors focal fatty sparing. Recommend attention on follow-up. 4. Postsurgical change consistent bowel resection. No evidence local recurrence. Electronically Signed   By: Suzy Bouchard M.D.   On: 01/18/2019 16:58   CT Abdomen Pelvis W Contrast  Result Date: 01/18/2019 CLINICAL DATA:  RIGHT lung cancer. Jejunal cancer. Chemotherapy ongoing. Diffuse abdominal pain. EXAM: CT CHEST, ABDOMEN, AND PELVIS WITH CONTRAST TECHNIQUE: Multidetector CT imaging of the chest, abdomen and pelvis was performed  following the standard protocol during bolus administration of intravenous contrast. CONTRAST:  6m OMNIPAQUE IOHEXOL 300 MG/ML  SOLN COMPARISON:  CT abdomen 12/08/2018, chest CT 11/10/2018 FINDINGS: CT CHEST FINDINGS Cardiovascular: No significant vascular findings. Normal heart size. No pericardial effusion. Coronary artery calcification and aortic atherosclerotic calcification. Mediastinum/Nodes: No axillary supraclavicular adenopathy. Increase in size of low-density lymph node in the high mediastinum (thoracic inlet) measuring 1.6 cm (image 16/2). No pericardial effusion. Esophagus normal. Lungs/Pleura: New irregular nodule in the RIGHT lower lobe measures 10 mm (image 122/6). Less well-defined peribronchial thickening in the LEFT upper lobe on image 78/6) Musculoskeletal: No aggressive osseous lesion. CT ABDOMEN AND PELVIS FINDINGS Hepatobiliary: Low-attenuation liver. Enhancing lesion in the inferior inferior LEFT hepatic lobe adjacent to the gallbladder fossa measuring 11 mm on image 64/2. Pancreas: Pancreas is normal. No ductal dilatation. No pancreatic inflammation. Spleen: Normal spleen Adrenals/urinary tract: Adrenal glands and kidneys are normal. The ureters and bladder normal. Stomach/Bowel: Medications in the stomach. Duodenum and small-bowel normal. There is anastomosis in the small bowel. Terminal ileum normal. Ascending, transverse and descending colon normal. Rectosigmoid colon normal. Vascular/Lymphatic: Calcification abdominal aorta. Interval increase in size of low-density periaortic retroperitoneal lymphadenopathy. For example 3.6 cm nodal mass adjacent to the LEFT kidney compares to 2.7 cm on CT 12/08/2018. Lymph node complex deep to the IVC at the same level measures 3.2 cm (image 73/2) compared to 2.9 cm. Nodal implant within the central mesentery measuring 2.5 by 1.6 cm (image 73/2) increased from 2.5 x 1.1 cm. Reproductive: Prostate normal. Other: No free fluid. Musculoskeletal: No  aggressive osseous lesion. IMPRESSION: Chest Impression: 1. New nodularity in the RIGHT lower lobe is concerning for metastatic lesion. Focus of pulmonary infection would be secondary differential. Recommend close attention on follow-up. 2. Peribronchial thickening in the LEFT upper lobe warrants attention on follow-up. 3. Metastatic lymph node in the thoracic inlet/prevascular space. Abdomen / Pelvis Impression: 1. Interval increase in size of bulky low-density periaortic retroperitoneal lymphadenopathy. 2. Interval increase in size of mesenteric nodal implant. 3. Enhancing lesion in the inferior LEFT hepatic lobe. Differential includes hepatic metastasis versus focal fatty sparing. Location favors focal fatty sparing. Recommend attention on follow-up. 4. Postsurgical change consistent bowel resection. No evidence local recurrence. Electronically Signed   By: SSuzy BouchardM.D.   On: 01/18/2019 16:58   NM PET Image Restag (PS) Skull Base To Thigh  Result Date: 02/02/2019 CLINICAL DATA:  Subsequent treatment strategy for non-small cell lung cancer. EXAM: NUCLEAR MEDICINE PET SKULL BASE TO THIGH TECHNIQUE: 4.97 mCi F-18 FDG was injected intravenously. Full-ring PET imaging was performed from the skull base to thigh after the radiotracer. CT data was obtained and used for attenuation correction and anatomic localization. Fasting blood glucose: 127 mg/dl COMPARISON:  CT chest, abdomen and pelvis 01/18/2019 and multiple prior studies, including PET evaluation from 10/03/2015. FINDINGS: Mediastinal blood pool activity: SUV max 1.72 Liver activity: SUV max 1.99 NECK:  Ovoid low-density lymph node in the left low neck, level IV (image 50, series 4) 1.3 cm (SUVmax = 10.90) Slightly enlarged compared to the chest CT of 01/18/2027 but not present on the exam, PET exam of 10/03/2015. Incidental CT findings: Signs of bilateral maxillary sinus disease worse than on the prior study with near complete opacification of the  left maxillary sinus. Evidence of right carotid stenting. CHEST: Necrotic appearing lymph node at the thoracic inlet measuring 1.7 cm in short axis within 2 mm of most recent comparison (SUVmax = 5.33) Retrotracheal lymph node with necrosis unchanged from previous CT, not present on more remote PET examination measuring 7 mm (image 68, series 4) (SUVmax = 5.84) Similarly hypermetabolic small, subcentimeter lymph node along the right paratracheal chain measuring 7-8 mm (image 66, series 4) no signs of hilar or axillary adenopathy with hypermetabolic features. Basilar nodularity on the right just above the right hemidiaphragm new compared to the PET exam of 10/03/2015 (image 97, series 4) 9 mm and (image 100, series 4) 6 mm, background FDG uptake less than blood pool or liver. Smaller nodules are seen in this location, clusters of nodules. Incidental CT findings: Left subclavian Port-A-Cath terminates in the distal superior vena cava. Heart size is normal without pericardial effusion and extensive coronary artery calcification. Scattered aortic calcification. Central pulmonary vasculature is of normal caliber. Vessels not well assessed given lack of intravenous contrast. Esophagus grossly normal by CT. Signs of prior partial lung resection in the right chest related to right upper lobectomy. ABDOMEN/PELVIS: Bulky retroperitoneal adenopathy with signs of necrosis in the upper abdomen and retrocrural. Left para-aortic nodal group measures 3.6 similar to prior study (SUVmax = 6.49) not present on the previous imaging study. Intra-aortocaval nodal group measures 2.6 cm in short axis, also similar to previous exam. X 3.2 cm. (SUVmax = 6.11) Nodal group in the root of the small bowel mesentery (image 125, series 4) 2 cm short axis, not changed in terms of size. (SUVmax = 7.57 No signs of solid lesion with uptake to suggest metastatic disease to abdominal viscera. No signs of pelvic adenopathy.) Incidental CT findings:  Marked hepatic steatosis. Sludge layers dependently in the gallbladder. Spleen is normal size. Pancreas is atrophic. Adrenal glands are normal. Noncontrast appearance of kidneys is normal. Signs of prior bowel resection with mesenteric nodal disease as discussed. No acute bowel process. Small volume free fluid in the pelvis is nonspecific and similar to the prior exam. SKELETON: No focal hypermetabolic activity to suggest skeletal metastasis. Incidental CT findings: Spinal degenerative changes. IMPRESSION: 1. Bulky adenopathy in the neck, chest and abdomen with similar appearance when compared to the study of December of 2020 showing hypermetabolic features but with areas of necrosis. Left supraclavicular lymph node immediately deep to skin surface in the left low neck at level IV is intensely hypermetabolic. 2. Less pronounced uptake and small pulmonary lesions with some grouped nodularity, potentially related to infectious or inflammatory process. Attention on follow-up is suggested. 3. Marked hepatic steatosis. Electronically Signed   By: Zetta Bills M.D.   On: 02/02/2019 13:34     ASSESSMENT AND PLAN:  This is a very pleasant 59 years old white male with metastatic non-small cell lung cancer, adenocarcinoma status post right upper lobectomy with lymph node dissection. Unfortunately the patient was found to have metastatic disease in the jejunum and terminal ileum.  He underwent surgical resection of the proximal and distal jejunum as well as the proximal ileum and the final pathology was  consistent with high-grade neuroendocrine carcinoma. The patient was started on treatment with systemic chemotherapy with carboplatin and Alimta for 2 cycles discontinued secondary to intolerance and disease progression. The patient completed treatment with second line immunotherapy with Ketruda (pembrolizumab) 200 mg IV every 3 weeks, status post 40 cycles.  He has been tolerating this treatment well. This treatment  was discontinued secondary to disease progression. The patient was started on systemic chemotherapy with docetaxel and Cyramza status post 2 cycles.  This treatment was discontinued secondary to disease progression. I ordered a PET scan which was performed earlier today and it showed bulky adenopathy in the neck, chest as well as abdomen with similar appearance to the previous CT scan in December 2020 and showing hypermetabolic features. Because of the history of having to type of malignancy in the past including adenocarcinoma in the lung as well as high-grade neuroendocrine carcinoma in the abdomen, I recommended for the patient to have CT-guided core biopsy of one of the hypermetabolic abdominal lymphadenopathy for differentiation between the 2 types especially with the abnormality in the abdomen. I will arrange for the patient to come back for follow-up visit in around 10 days for discussion of his treatment options based on the biopsy results. For the diarrhea, he will continue on Imodium. For the diabetes mellitus he is followed by Dr. Daryll Drown at the [facility] internal medicine teaching program. For the back pain, this is likely secondary to the retroperitoneal lymphadenopathy as well as previous history was arthritis.  He will continue on oxycodone for now and I will give him refill as needed. The patient was advised to call immediately if he has any concerning symptoms in the interval. The patient voices understanding of current disease status and treatment options and is in agreement with the current care plan. All questions were answered. The patient knows to call the clinic with any problems, questions or concerns. We can certainly see the patient much sooner if necessary.  Disclaimer: This note was dictated with voice recognition software. Similar sounding words can inadvertently be transcribed and may not be corrected upon review.

## 2019-02-03 ENCOUNTER — Telehealth: Payer: Self-pay | Admitting: Medical Oncology

## 2019-02-03 ENCOUNTER — Telehealth: Payer: Self-pay | Admitting: Internal Medicine

## 2019-02-03 ENCOUNTER — Other Ambulatory Visit: Payer: Self-pay | Admitting: Internal Medicine

## 2019-02-03 MED ORDER — MORPHINE SULFATE ER 15 MG PO TBCR: 15.0000 mg | EXTENDED_RELEASE_TABLET | Freq: Two times a day (BID) | ORAL | 0 refills | Status: DC

## 2019-02-03 NOTE — Telephone Encounter (Signed)
Pain management-Pain not controlled with 1 percocet 10 mg  q 4 or 2 tabs q 6hr. He wants to continue these prn ,but needs something stronger to keep his pain under control.

## 2019-02-03 NOTE — Telephone Encounter (Signed)
I will start him on MS Contin in addition to his breakthrough pain medication.

## 2019-02-03 NOTE — Telephone Encounter (Signed)
Scheduled per los. Called and spoke to patients wife. Confirmed appt

## 2019-02-04 ENCOUNTER — Other Ambulatory Visit: Payer: Self-pay

## 2019-02-04 DIAGNOSIS — E1159 Type 2 diabetes mellitus with other circulatory complications: Secondary | ICD-10-CM

## 2019-02-04 NOTE — Telephone Encounter (Signed)
Wife notified.

## 2019-02-05 MED ORDER — SITAGLIPTIN PHOSPHATE 100 MG PO TABS: 100.0000 mg | ORAL_TABLET | Freq: Every day | ORAL | 3 refills | Status: AC

## 2019-02-09 ENCOUNTER — Other Ambulatory Visit: Payer: Self-pay | Admitting: Radiology

## 2019-02-10 ENCOUNTER — Other Ambulatory Visit: Payer: Self-pay

## 2019-02-10 ENCOUNTER — Ambulatory Visit (HOSPITAL_COMMUNITY)
Admission: RE | Admit: 2019-02-10 | Discharge: 2019-02-10 | Disposition: A | Discharge status: Home / Self Care | Payer: Medicare HMO | Source: Ambulatory Visit | Attending: Internal Medicine | Admitting: Internal Medicine

## 2019-02-10 ENCOUNTER — Encounter (HOSPITAL_COMMUNITY): Payer: Self-pay

## 2019-02-10 DIAGNOSIS — C480 Malignant neoplasm of retroperitoneum: Secondary | ICD-10-CM | POA: Diagnosis not present

## 2019-02-10 DIAGNOSIS — K760 Fatty (change of) liver, not elsewhere classified: Secondary | ICD-10-CM | POA: Diagnosis not present

## 2019-02-10 DIAGNOSIS — F17210 Nicotine dependence, cigarettes, uncomplicated: Secondary | ICD-10-CM | POA: Diagnosis not present

## 2019-02-10 DIAGNOSIS — R599 Enlarged lymph nodes, unspecified: Secondary | ICD-10-CM | POA: Diagnosis not present

## 2019-02-10 DIAGNOSIS — C3491 Malignant neoplasm of unspecified part of right bronchus or lung: Secondary | ICD-10-CM | POA: Insufficient documentation

## 2019-02-10 DIAGNOSIS — R590 Localized enlarged lymph nodes: Secondary | ICD-10-CM | POA: Diagnosis not present

## 2019-02-10 LAB — CBC
HCT: 40.4 % (ref 39.0–52.0)
Hemoglobin: 12.9 g/dL — ABNORMAL LOW (ref 13.0–17.0)
MCH: 31.5 pg (ref 26.0–34.0)
MCHC: 31.9 g/dL (ref 30.0–36.0)
MCV: 98.8 fL (ref 80.0–100.0)
Platelets: 263 10*3/uL (ref 150–400)
RBC: 4.09 MIL/uL — ABNORMAL LOW (ref 4.22–5.81)
RDW: 14.2 % (ref 11.5–15.5)
WBC: 5.3 10*3/uL (ref 4.0–10.5)
nRBC: 0 % (ref 0.0–0.2)

## 2019-02-10 LAB — GLUCOSE, CAPILLARY: Glucose-Capillary: 88 mg/dL (ref 70–99)

## 2019-02-10 LAB — PROTIME-INR
INR: 1.1 (ref 0.8–1.2)
Prothrombin Time: 13.9 seconds (ref 11.4–15.2)

## 2019-02-10 LAB — APTT: aPTT: 29 seconds (ref 24–36)

## 2019-02-10 MED ORDER — SODIUM CHLORIDE 0.9 % IV SOLN: INTRAVENOUS | Status: DC

## 2019-02-10 MED ORDER — FENTANYL CITRATE (PF) 100 MCG/2ML IJ SOLN
INTRAMUSCULAR | Status: AC | PRN
  Administered 2019-02-10: 25 ug via INTRAVENOUS

## 2019-02-10 MED ORDER — FLUMAZENIL 0.5 MG/5ML IV SOLN
INTRAVENOUS | Status: AC
  Filled 2019-02-10: qty 5

## 2019-02-10 MED ORDER — MIDAZOLAM HCL 2 MG/2ML IJ SOLN
INTRAMUSCULAR | Status: AC
  Filled 2019-02-10: qty 4

## 2019-02-10 MED ORDER — NALOXONE HCL 0.4 MG/ML IJ SOLN
INTRAMUSCULAR | Status: AC
  Filled 2019-02-10: qty 1

## 2019-02-10 MED ORDER — LIDOCAINE HCL (PF) 1 % IJ SOLN
INTRAMUSCULAR | Status: AC | PRN
  Administered 2019-02-10: 10 mL

## 2019-02-10 MED ORDER — FENTANYL CITRATE (PF) 100 MCG/2ML IJ SOLN
INTRAMUSCULAR | Status: AC
  Filled 2019-02-10: qty 2

## 2019-02-10 MED ORDER — MIDAZOLAM HCL 2 MG/2ML IJ SOLN
INTRAMUSCULAR | Status: AC | PRN
  Administered 2019-02-10: 1 mg via INTRAVENOUS

## 2019-02-10 NOTE — Discharge Instructions (Addendum)
Contact Interventional Radiologist PA or IR on call at [phone] with any questions or concerns  You may shower and remove your dressing tomorrow.  Moderate Conscious Sedation, Adult, Care After These instructions provide you with information about caring for yourself after your procedure. Your health care provider may also give you more specific instructions. Your treatment has been planned according to current medical practices, but problems sometimes occur. Call your health care provider if you have any problems or questions after your procedure. What can I expect after the procedure? After your procedure, it is common:  To feel sleepy for several hours.  To feel clumsy and have poor balance for several hours.  To have poor judgment for several hours.  To vomit if you eat too soon. Follow these instructions at home: For at least 24 hours after the procedure:   Do not: ? Participate in activities where you could fall or become injured. ? Drive. ? Use heavy machinery. ? Drink alcohol. ? Take sleeping pills or medicines that cause drowsiness. ? Make important decisions or sign legal documents. ? Take care of children on your own.  Rest. Eating and drinking  Follow the diet recommended by your health care provider.  If you vomit: ? Drink water, juice, or soup when you can drink without vomiting. ? Make sure you have little or no nausea before eating solid foods. General instructions  Have a responsible adult stay with you until you are awake and alert.  Take over-the-counter and prescription medicines only as told by your health care provider.  If you smoke, do not smoke without supervision.  Keep all follow-up visits as told by your health care provider. This is important. Contact a health care provider if:  You keep feeling nauseous or you keep vomiting.  You feel light-headed.  You develop a rash.  You have a fever. Get help  right away if:  You have trouble breathing. This information is not intended to replace advice given to you by your health care provider. Make sure you discuss any questions you have with your health care provider. Document Revised: 12/20/2016 Document Reviewed: 04/29/2015 Elsevier Patient Education  [place].    Needle Biopsy, Care After These instructions tell you how to care for yourself after your procedure. Your doctor may also give you more specific instructions. Call your doctor if you have any problems or questions. What can I expect after the procedure? After the procedure, it is common to have:  Soreness.  Bruising.  Mild pain. Follow these instructions at home:   Return to your normal activities as told by your doctor. Ask your doctor what activities are safe for you.  Take over-the-counter and prescription medicines only as told by your doctor.  Wash your hands with soap and water before you change your bandage (dressing). If you cannot use soap and water, use hand sanitizer.  Follow instructions from your doctor about: ? How to take care of your puncture site. ? When and how to change your bandage. ? When to remove your bandage.  Check your puncture site every day for signs of infection. Watch for: ? Redness, swelling, or pain. ? Fluid or blood. ? Pus or a bad smell. ? Warmth.  Do not take baths, swim, or use a hot tub until your doctor approves. Ask your doctor if you may take showers. You may only be allowed to take sponge baths.  Keep all follow-up visits as told by your doctor. This is  important. Contact a doctor if you have:  A fever.  Redness, swelling, or pain at the puncture site, and it lasts longer than a few days.  Fluid, blood, or pus coming from the puncture site.  Warmth coming from the puncture site. Get help right away if:  You have a lot of bleeding from the puncture site. Summary  After the procedure, it is common to  have soreness, bruising, or mild pain at the puncture site.  Check your puncture site every day for signs of infection, such as redness, swelling, or pain.  Get help right away if you have severe bleeding from your puncture site. This information is not intended to replace advice given to you by your health care provider. Make sure you discuss any questions you have with your health care provider. Document Revised: 01/20/2017 Document Reviewed: 01/20/2017 Elsevier Patient Education  2020 [employer].

## 2019-02-10 NOTE — Consult Note (Signed)
Chief Complaint: Patient was seen in consultation today for image guided left retroperitoneal lymph node biopsy  Referring Physician(s): Mohamed,Mohamed  Supervising Physician: Corrie Mckusick  Patient Status: [facility] - Out-pt  History of Present Illness: Brett [name] is a 59 y.o. male with history of history of metastatic non-small cell lung cancer/adenocarcinoma with prior right upper lobectomy and lymph node dissection in 2017.  He was recently found to have metastatic disease in the jejunum and terminal ileum, status post surgical resection with pathology revealing high-grade neuroendocrine carcinoma.  He has received prior chemotherapy as well as immunotherapy.  Recent PET scan reveals: 1. Bulky adenopathy in the neck, chest and abdomen with similar appearance when compared to the study of December of 2020 showing hypermetabolic features but with areas of necrosis. Left supraclavicular lymph node immediately deep to skin surface in the left low neck at level IV is intensely hypermetabolic. 2. Less pronounced uptake and small pulmonary lesions with some grouped nodularity, potentially related to infectious or inflammatory process. Attention on follow-up is suggested. 3. Marked hepatic steatosis.  He presents today for image guided left retroperitoneal lymph node biopsy for further evaluation.   Past Medical History:  Diagnosis Date  . Barrett's esophagus 07/01/2013   Without dysplasia on biopsy 09/03/2012. Repeat EGD recommended 08/2015  . Bilateral cataracts 02/13/2017  . Carotid artery stenosis 07/01/2013   Requiring right sided stent   . Chronic pain syndrome 07/01/2013  . Closed head injury with brief loss of consciousness ([place]) 07/22/2010   Head trapped in a hydraulic device at work.  Fracture of orbital bones on right and brief loss of consciousness per report.  . Cognitive disorder 04/15/2011   Neuropsychological evaluation (03/2010):  Identified a number of problem areas  including cognitive and psychiatric symptoms following a TBI in July 2012. There was likely a strong psycho-social overlay in regard to the cognitive deficits in the form of mood disorder with psychotic features and mixed anxiety symptomatology. His primary tested cognitive deficits are in the areas of attention, executi  . Daily headache "since 07/2010"   constantly  . Degenerative joint disease of cervical spine 07/01/2013  . Dupuytren's contracture of both hands 04/08/2014  . Encounter for antineoplastic chemotherapy 10/05/2015  . Encounter for antineoplastic immunotherapy 05/24/2016  . Erectile dysfunction associated with type 2 diabetes mellitus ([place]) 07/01/2013  . Fibromyalgia 07/01/2013  . Goals of care, counseling/discussion 03/28/2016  . History of blood transfusion 11/28/2015   "suppose to get his first today" (11/28/2015)  . Hyperlipidemia LDL goal < 100 07/01/2013  . Intractable hiccups 04/11/2016  . 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 Memory changes    "memory issues" from head injury  . Moderate protein-calorie malnutrition ([place]) 11/29/2015  . Osteoarthritis of right thumb 10/21/2014  . Peripheral vascular occlusive disease ([place]) 07/01/2013   Requiring 2 arterial stents above the left knee per report  . Pneumonia ~ 2006/2007  . Post traumatic stress disorder 07/01/2013  . Primary lung adenocarcinoma ([place]) dx'd 08/2015   "right lung"  . Severe major depression with psychotic features ([place]) 04/15/2011  . Tobacco abuse 07/01/2013  . Type 2 diabetes mellitus with vascular disease ([place]) 07/01/2013   Left lower extremity and right carotid stenting    Past Surgical History:  Procedure Laterality Date  . CAROTID STENT Right ?2014  . COLONOSCOPY N/A 11/30/2015   Procedure: COLONOSCOPY;  Surgeon: Teena Irani, MD;  Location: [facility] ENDOSCOPY;  Service: Endoscopy;  Laterality: N/A;  . ESOPHAGOGASTRODUODENOSCOPY N/A 11/30/2015   Procedure:  ESOPHAGOGASTRODUODENOSCOPY (EGD);  Surgeon: Teena Irani, MD;   Location: [facility] ENDOSCOPY;  Service: Endoscopy;  Laterality: N/A;  . ESOPHAGOGASTRODUODENOSCOPY (EGD) WITH PROPOFOL N/A 01/05/2016   Procedure: ESOPHAGOGASTRODUODENOSCOPY (EGD) WITH PROPOFOL;  Surgeon: Teena Irani, MD;  Location: [facility] ENDOSCOPY;  Service: Endoscopy;  Laterality: N/A;  . FEMORAL ARTERY STENT Left 05/2012; ~ 2015   Archie Endo 06/04/2012; Raechel Chute report  . FRACTURE SURGERY    . GIVENS CAPSULE STUDY N/A 12/22/2015   Procedure: GIVENS CAPSULE STUDY;  Surgeon: Wonda Horner, MD;  Location: [facility] ENDOSCOPY;  Service: Endoscopy;  Laterality: N/A;  . HARDWARE REMOVAL Right 11/15/2011   Removal of deep frontozygomatic orbital hardware/notes 11/15/2011  . HERNIA REPAIR  1224   Umbilical  . LAPAROSCOPY N/A 02/27/2016   Procedure: LAPAROSCOPY, LAPAROTOMY  WITH TWO SMALL BOWEL RESECTION;  Surgeon: Johnathan Hausen, MD;  Location: [facility] ORS;  Service: General;  Laterality: N/A;  . ORIF ORBITAL FRACTURE Right 08/15/2010    caught in a hydraulic machine; open reduction internal fixation of orbital rim fracture and open reduction of zygomatic arch fracture  Archie Endo 10/13/2009  . PORTACATH PLACEMENT Left 04/04/2016   Procedure: INSERTION PORT-A-CATH LEFT CHEST;  Surgeon: Melrose Nakayama, MD;  Location: [facility];  Service: Thoracic;  Laterality: Left;  Marland Kitchen VIDEO ASSISTED THORACOSCOPY (VATS)/ LOBECTOMY Right 10/18/2015   Procedure: VIDEO ASSISTED THORACOSCOPY (VATS)/ LOBECTOMY;  Surgeon: Melrose Nakayama, MD;  Location: [facility];  Service: Thoracic;  Laterality: Right;  Marland Kitchen VIDEO BRONCHOSCOPY Bilateral 09/21/2015   Procedure: VIDEO BRONCHOSCOPY WITH FLUORO;  Surgeon: Juanito Doom, MD;  Location: [facility] ENDOSCOPY;  Service: Cardiopulmonary;  Laterality: Bilateral;    Allergies: Gabapentin, Lyrica [pregabalin], Jardiance [empagliflozin], Celebrex [celecoxib], and Contrast media [iodinated diagnostic agents]  Medications: Prior to Admission medications   Medication Sig Start Date End Date Taking? Authorizing Provider    ACCU-CHEK SOFTCLIX LANCETS lancets Use to test blood glucose 1-2 times daily. Dx Code E11.59 10/03/17   [name], MD  aspirin EC 81 MG tablet Take 81 mg by mouth daily.     [provider]  atorvastatin (LIPITOR) 40 MG tablet Take 1 tablet (40 mg total) by mouth daily. 01/09/18 01/26/19  [name], MD  Blood Glucose Monitoring Suppl (ACCU-CHEK AVIVA PLUS) w/Device KIT Use to test blood glucose 1-2 times daily. Dx Code E11.59 04/06/18   [name], MD  dexamethasone (DECADRON) 4 MG tablet 4 mg p.o. twice daily the day before, day of and day after chemotherapy every 3 weeks. Patient taking differently: Take 4 mg by mouth See admin instructions. Take 1 tablet. twice daily the day before, day of and day after chemotherapy every 3 weeks. 11/12/18   Curt Bears, MD  glipiZIDE (GLUCOTROL) 10 MG tablet Take 1 tablet (10 mg total) by mouth 2 (two) times daily before a meal. 12/08/17   [name], MD  glucose blood test strip USE TO TEST BLOOD GLUCOSE 1 TO 2 TIMES DAILY 03/30/18   [name], MD  insulin aspart (NOVOLOG) 100 UNIT/ML FlexPen Inject 1-9 units into the skin with your 2 biggest meals  based on the sliding scale provided to you. 12/10/18   Santos-Sanchez, Merlene Morse, MD  Insulin Glargine (LANTUS) 100 UNIT/ML Solostar Pen Inject 5 Units into the skin daily. 10/23/18   Sid Falcon, MD  Insulin Pen Needle (PEN NEEDLES) 32G X 4 MM MISC 1 pen by Does not apply route 2 (two) times daily. Dx Code E11.59 12/10/18   Welford Roche, MD  levothyroxine (SYNTHROID) 50 MCG tablet Take  1 tablet (50 mcg total) by mouth daily before breakfast. 12/18/18   Heilingoetter, Cassandra L, PA-C  morphine (MS CONTIN) 15 MG 12 hr tablet Take 1 tablet (15 mg total) by mouth every 12 (twelve) hours. 02/03/19   Curt Bears, MD  oxyCODONE-acetaminophen (PERCOCET) 10-325 MG tablet Take 1 tablet by mouth every 6 (six) hours as needed for pain. 11/13/18 11/13/19  Sid Falcon, MD   oxyCODONE-acetaminophen (PERCOCET) 10-325 MG tablet Take 1-2 tablets by mouth every 6 (six) hours as needed for pain. 01/13/19   Sid Falcon, MD  pantoprazole (PROTONIX) 40 MG tablet TAKE 1 TABLET(40 MG) BY MOUTH DAILY Patient taking differently: Take 40 mg by mouth daily.  11/27/18   Sid Falcon, MD  predniSONE (DELTASONE) 50 MG tablet Take one tablet 13 hours , one tablet 7 hours and one tablet 1 hour prior to CT scan. 01/13/19   Curt Bears, MD  prochlorperazine (COMPAZINE) 10 MG tablet Take 1 tablet (10 mg total) by mouth every 6 (six) hours as needed for nausea or vomiting. 12/29/18   Sid Falcon, MD  sitaGLIPtin (JANUVIA) 100 MG tablet Take 1 tablet (100 mg total) by mouth daily. 02/05/19   Sid Falcon, MD  predniSONE (DELTASONE) 50 MG tablet TAKE 1 TABLET BY MOUTH AT 13 HOURS, 7 HOURS, AND 1 HOUR PRIOR TO SCAN 05/28/18   Heilingoetter, Cassandra L, PA-C     Family History  Problem Relation Age of Onset  . Heart disease Mother   . Diabetes Mother   . Diabetes Father   . Heart attack Father   . Diabetes Sister   . Coronary artery disease Sister        s/p CABG  . Diabetes Brother   . Healthy Daughter   . Healthy Son   . Diabetes Sister   . Healthy Sister   . Healthy Sister   . Diabetes Brother   . Coronary artery disease Brother        s/p CABG  . Diabetes Brother   . Healthy Brother   . Healthy Brother   . Healthy Daughter     Social History   Socioeconomic History  . Marital status: Married    Spouse name: Not on file  . Number of children: Not on file  . Years of education: Not on file  . Highest education level: Not on file  Occupational History  . Not on file  Tobacco Use  . Smoking status: Current Every Day Smoker    Packs/day: 1.00    Years: 35.00    Pack years: 35.00    Types: Cigarettes    Last attempt to quit: 10/18/2015    Years since quitting: 3.3  . Smokeless tobacco: Never Used  . Tobacco comment: .5PPD  Substance and Sexual  Activity  . Alcohol use: Yes    Alcohol/week: 0.0 standard drinks    Comment: Rarely.  . Drug use: No  . Sexual activity: Not Currently    Birth control/protection: None    Comment: Wife had tubes tied  Other Topics Concern  . 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  Currently unemployed.  Finished 9th grade.  Lives with wife, 2 daughters, and mother-in-law in [place].   Social Determinants of Health   Financial Resource Strain:   . Difficulty of Paying Living Expenses: Not  on file  Food Insecurity:   . Worried About Charity fundraiser in the Last Year: Not on file  . Ran Out of Food in the Last Year: Not on file  Transportation Needs:   . Lack of Transportation (Medical): Not on file  . Lack of Transportation (Non-Medical): Not on file  Physical Activity:   . Days of Exercise per Week: Not on file  . Minutes of Exercise per Session: Not on file  Stress:   . Feeling of Stress : Not on file  Social Connections:   . Frequency of Communication with Friends and Family: Not on file  . Frequency of Social Gatherings with Friends and Family: Not on file  . Attends Religious Services: Not on file  . Active Member of Clubs or Organizations: Not on file  . Attends Archivist Meetings: Not on file  . Marital Status: Not on file      Review of Systems currently denies fever, chest pain, worsening dyspnea, cough, vomiting or abnormal bleeding.  He does have intermittent headaches, abdominal/back pain, weight loss, intermittent nausea.  Vital Signs: BP 97/77 (BP Location: Left Arm)   Pulse (!) 106   Temp 97.7 F (36.5 C) (Oral)   Resp 16   SpO2 100%   Physical Exam cachectic appearing white male in no acute distress.  Chest with distant breath sounds bilaterally.  Intact left chest wall Port-A-Cath.  Heart with regular rate/ rhythm.   Abdomen soft, positive bowel sounds, some tenderness noted right lower quadrant.  Bilateral lower extremity edema present  Imaging: CT Chest W Contrast  Result Date: 01/18/2019 CLINICAL DATA:  RIGHT lung cancer. Jejunal cancer. Chemotherapy ongoing. Diffuse abdominal pain. EXAM: CT CHEST, ABDOMEN, AND PELVIS WITH CONTRAST TECHNIQUE: Multidetector CT imaging of the chest, abdomen and pelvis was performed following the standard protocol during bolus administration of intravenous contrast. CONTRAST:  99m OMNIPAQUE IOHEXOL 300 MG/ML  SOLN COMPARISON:  CT abdomen 12/08/2018, chest CT 11/10/2018 FINDINGS: CT CHEST FINDINGS Cardiovascular: No significant vascular findings. Normal heart size. No pericardial effusion. Coronary artery calcification and aortic atherosclerotic calcification. Mediastinum/Nodes: No axillary supraclavicular adenopathy. Increase in size of low-density lymph node in the high mediastinum (thoracic inlet) measuring 1.6 cm (image 16/2). No pericardial effusion. Esophagus normal. Lungs/Pleura: New irregular nodule in the RIGHT lower lobe measures 10 mm (image 122/6). Less well-defined peribronchial thickening in the LEFT upper lobe on image 78/6) Musculoskeletal: No aggressive osseous lesion. CT ABDOMEN AND PELVIS FINDINGS Hepatobiliary: Low-attenuation liver. Enhancing lesion in the inferior inferior LEFT hepatic lobe adjacent to the gallbladder fossa measuring 11 mm on image 64/2. Pancreas: Pancreas is normal. No ductal dilatation. No pancreatic inflammation. Spleen: Normal spleen Adrenals/urinary tract: Adrenal glands and kidneys are normal. The ureters and bladder normal. Stomach/Bowel: Medications in the stomach. Duodenum and small-bowel normal. There is anastomosis in the small bowel. Terminal ileum normal. Ascending, transverse and descending colon normal. Rectosigmoid colon normal. Vascular/Lymphatic: Calcification abdominal aorta. Interval increase in size of low-density periaortic  retroperitoneal lymphadenopathy. For example 3.6 cm nodal mass adjacent to the LEFT kidney compares to 2.7 cm on CT 12/08/2018. Lymph node complex deep to the IVC at the same level measures 3.2 cm (image 73/2) compared to 2.9 cm. Nodal implant within the central mesentery measuring 2.5 by 1.6 cm (image 73/2) increased from 2.5 x 1.1 cm. Reproductive: Prostate normal. Other: No free fluid. Musculoskeletal: No aggressive osseous lesion. IMPRESSION: Chest Impression: 1. New nodularity in the RIGHT lower lobe is concerning for metastatic  lesion. Focus of pulmonary infection would be secondary differential. Recommend close attention on follow-up. 2. Peribronchial thickening in the LEFT upper lobe warrants attention on follow-up. 3. Metastatic lymph node in the thoracic inlet/prevascular space. Abdomen / Pelvis Impression: 1. Interval increase in size of bulky low-density periaortic retroperitoneal lymphadenopathy. 2. Interval increase in size of mesenteric nodal implant. 3. Enhancing lesion in the inferior LEFT hepatic lobe. Differential includes hepatic metastasis versus focal fatty sparing. Location favors focal fatty sparing. Recommend attention on follow-up. 4. Postsurgical change consistent bowel resection. No evidence local recurrence. Electronically Signed   By: Suzy Bouchard M.D.   On: 01/18/2019 16:58   CT Abdomen Pelvis W Contrast  Result Date: 01/18/2019 CLINICAL DATA:  RIGHT lung cancer. Jejunal cancer. Chemotherapy ongoing. Diffuse abdominal pain. EXAM: CT CHEST, ABDOMEN, AND PELVIS WITH CONTRAST TECHNIQUE: Multidetector CT imaging of the chest, abdomen and pelvis was performed following the standard protocol during bolus administration of intravenous contrast. CONTRAST:  61m OMNIPAQUE IOHEXOL 300 MG/ML  SOLN COMPARISON:  CT abdomen 12/08/2018, chest CT 11/10/2018 FINDINGS: CT CHEST FINDINGS Cardiovascular: No significant vascular findings. Normal heart size. No pericardial effusion. Coronary artery  calcification and aortic atherosclerotic calcification. Mediastinum/Nodes: No axillary supraclavicular adenopathy. Increase in size of low-density lymph node in the high mediastinum (thoracic inlet) measuring 1.6 cm (image 16/2). No pericardial effusion. Esophagus normal. Lungs/Pleura: New irregular nodule in the RIGHT lower lobe measures 10 mm (image 122/6). Less well-defined peribronchial thickening in the LEFT upper lobe on image 78/6) Musculoskeletal: No aggressive osseous lesion. CT ABDOMEN AND PELVIS FINDINGS Hepatobiliary: Low-attenuation liver. Enhancing lesion in the inferior inferior LEFT hepatic lobe adjacent to the gallbladder fossa measuring 11 mm on image 64/2. Pancreas: Pancreas is normal. No ductal dilatation. No pancreatic inflammation. Spleen: Normal spleen Adrenals/urinary tract: Adrenal glands and kidneys are normal. The ureters and bladder normal. Stomach/Bowel: Medications in the stomach. Duodenum and small-bowel normal. There is anastomosis in the small bowel. Terminal ileum normal. Ascending, transverse and descending colon normal. Rectosigmoid colon normal. Vascular/Lymphatic: Calcification abdominal aorta. Interval increase in size of low-density periaortic retroperitoneal lymphadenopathy. For example 3.6 cm nodal mass adjacent to the LEFT kidney compares to 2.7 cm on CT 12/08/2018. Lymph node complex deep to the IVC at the same level measures 3.2 cm (image 73/2) compared to 2.9 cm. Nodal implant within the central mesentery measuring 2.5 by 1.6 cm (image 73/2) increased from 2.5 x 1.1 cm. Reproductive: Prostate normal. Other: No free fluid. Musculoskeletal: No aggressive osseous lesion. IMPRESSION: Chest Impression: 1. New nodularity in the RIGHT lower lobe is concerning for metastatic lesion. Focus of pulmonary infection would be secondary differential. Recommend close attention on follow-up. 2. Peribronchial thickening in the LEFT upper lobe warrants attention on follow-up. 3. Metastatic  lymph node in the thoracic inlet/prevascular space. Abdomen / Pelvis Impression: 1. Interval increase in size of bulky low-density periaortic retroperitoneal lymphadenopathy. 2. Interval increase in size of mesenteric nodal implant. 3. Enhancing lesion in the inferior LEFT hepatic lobe. Differential includes hepatic metastasis versus focal fatty sparing. Location favors focal fatty sparing. Recommend attention on follow-up. 4. Postsurgical change consistent bowel resection. No evidence local recurrence. Electronically Signed   By: SSuzy BouchardM.D.   On: 01/18/2019 16:58   NM PET Image Restag (PS) Skull Base To Thigh  Result Date: 02/02/2019 CLINICAL DATA:  Subsequent treatment strategy for non-small cell lung cancer. EXAM: NUCLEAR MEDICINE PET SKULL BASE TO THIGH TECHNIQUE: 4.97 mCi F-18 FDG was injected intravenously. Full-ring PET imaging was performed from the  skull base to thigh after the radiotracer. CT data was obtained and used for attenuation correction and anatomic localization. Fasting blood glucose: 127 mg/dl COMPARISON:  CT chest, abdomen and pelvis 01/18/2019 and multiple prior studies, including PET evaluation from 10/03/2015. FINDINGS: Mediastinal blood pool activity: SUV max 1.72 Liver activity: SUV max 1.99 NECK: Ovoid low-density lymph node in the left low neck, level IV (image 50, series 4) 1.3 cm (SUVmax = 10.90) Slightly enlarged compared to the chest CT of 01/18/2027 but not present on the exam, PET exam of 10/03/2015. Incidental CT findings: Signs of bilateral maxillary sinus disease worse than on the prior study with near complete opacification of the left maxillary sinus. Evidence of right carotid stenting. CHEST: Necrotic appearing lymph node at the thoracic inlet measuring 1.7 cm in short axis within 2 mm of most recent comparison (SUVmax = 5.33) Retrotracheal lymph node with necrosis unchanged from previous CT, not present on more remote PET examination measuring 7 mm (image 68,  series 4) (SUVmax = 5.84) Similarly hypermetabolic small, subcentimeter lymph node along the right paratracheal chain measuring 7-8 mm (image 66, series 4) no signs of hilar or axillary adenopathy with hypermetabolic features. Basilar nodularity on the right just above the right hemidiaphragm new compared to the PET exam of 10/03/2015 (image 97, series 4) 9 mm and (image 100, series 4) 6 mm, background FDG uptake less than blood pool or liver. Smaller nodules are seen in this location, clusters of nodules. Incidental CT findings: Left subclavian Port-A-Cath terminates in the distal superior vena cava. Heart size is normal without pericardial effusion and extensive coronary artery calcification. Scattered aortic calcification. Central pulmonary vasculature is of normal caliber. Vessels not well assessed given lack of intravenous contrast. Esophagus grossly normal by CT. Signs of prior partial lung resection in the right chest related to right upper lobectomy. ABDOMEN/PELVIS: Bulky retroperitoneal adenopathy with signs of necrosis in the upper abdomen and retrocrural. Left para-aortic nodal group measures 3.6 similar to prior study (SUVmax = 6.49) not present on the previous imaging study. Intra-aortocaval nodal group measures 2.6 cm in short axis, also similar to previous exam. X 3.2 cm. (SUVmax = 6.11) Nodal group in the root of the small bowel mesentery (image 125, series 4) 2 cm short axis, not changed in terms of size. (SUVmax = 7.57 No signs of solid lesion with uptake to suggest metastatic disease to abdominal viscera. No signs of pelvic adenopathy.) Incidental CT findings: Marked hepatic steatosis. Sludge layers dependently in the gallbladder. Spleen is normal size. Pancreas is atrophic. Adrenal glands are normal. Noncontrast appearance of kidneys is normal. Signs of prior bowel resection with mesenteric nodal disease as discussed. No acute bowel process. Small volume free fluid in the pelvis is nonspecific  and similar to the prior exam. SKELETON: No focal hypermetabolic activity to suggest skeletal metastasis. Incidental CT findings: Spinal degenerative changes. IMPRESSION: 1. Bulky adenopathy in the neck, chest and abdomen with similar appearance when compared to the study of December of 2020 showing hypermetabolic features but with areas of necrosis. Left supraclavicular lymph node immediately deep to skin surface in the left low neck at level IV is intensely hypermetabolic. 2. Less pronounced uptake and small pulmonary lesions with some grouped nodularity, potentially related to infectious or inflammatory process. Attention on follow-up is suggested. 3. Marked hepatic steatosis. Electronically Signed   By: Zetta Bills M.D.   On: 02/02/2019 13:34    Labs:  CBC: Recent Labs    12/09/18 0602 12/10/18 1635 12/31/18  1112 01/19/19 1342  WBC 11.3* 10.8* 3.9* 17.0*  HGB 11.5* 12.9* 14.0 11.8*  HCT 35.7* 40.0 42.5 35.8*  PLT 305 361 270 312    COAGS: Recent Labs    12/06/18 0128  INR 1.0    BMP: Recent Labs    12/09/18 0602 12/10/18 1635 12/31/18 1112 01/19/19 1342  NA 142 136 135 136  K 3.9 3.9 4.6 4.4  CL 105 104 97* 101  CO2 _0 GLUCOSE 96 159* 282* 331*  BUN <5* <5* 8 8  CALCIUM 8.2* 8.3* 9.1 8.3*  CREATININE 0.52* 0.49* 0.73 0.44*  GFRNONAA >60 >60 >60 >60  GFRAA >60 >60 >60 >60    LIVER FUNCTION TESTS: Recent Labs    12/06/18 0128 12/07/18 0800 12/31/18 1112 01/19/19 1342  BILITOT 0.6 0.5 0.6 0.7  AST _1 38  ALT _2 ALKPHOS 162* 136* 122 141*  PROT 5.3* 4.4* 6.5 5.2*  ALBUMIN 2.8* 2.3* 3.5 2.8*    TUMOR MARKERS: No results for input(s): AFPTM, CEA, CA199, CHROMGRNA in the last 8760 hours.  Assessment and Plan: 59 y.o. male with history of history of metastatic non-small cell lung cancer/adenocarcinoma with prior right upper lobectomy and lymph node dissection in 2017.  He was recently found to have metastatic disease in the  jejunum and terminal ileum, status post surgical resection with pathology revealing high-grade neuroendocrine carcinoma.  He has received prior chemotherapy as well as immunotherapy.  Recent PET scan reveals: 1. Bulky adenopathy in the neck, chest and abdomen with similar appearance when compared to the study of December of 2020 showing hypermetabolic features but with areas of necrosis. Left supraclavicular lymph node immediately deep to skin surface in the left low neck at level IV is intensely hypermetabolic. 2. Less pronounced uptake and small pulmonary lesions with some grouped nodularity, potentially related to infectious or inflammatory process. Attention on follow-up is suggested. 3. Marked hepatic steatosis.  He presents today for image guided left retroperitoneal lymph node biopsy for further evaluation. Risks and benefits of procedure was discussed with the patient  including, but not limited to bleeding, infection, damage to adjacent structures or low yield requiring additional tests.  All of the questions were answered and there is agreement to proceed.  Consent signed and in chart.  LABS PENDING   Thank you for this interesting consult.  I greatly enjoyed meeting Brett [name] and look forward to participating in their care.  A copy of this report was sent to the requesting provider on this date.  Electronically Signed: D. Rowe Robert, PA-C 02/10/2019, 9:57 AM   I spent a total of 25 minutes    in face to face in clinical consultation, greater than 50% of which was counseling/coordinating care for image guided left retroperitoneal lymph node biopsy

## 2019-02-10 NOTE — Procedures (Signed)
Interventional Radiology Procedure Note  Procedure: CT guided biopsy of left retroperitoneal lymph nodes. Findings: The PET shows only marginal activity, with likely necrosis of the central portion of nodes. The region of activity was targeted with only scan material retrieved.  .  Complications: None  Recommendations:  - Ok to shower tomorrow - Do not submerge for 7 days - Routine care   Signed,  Dulcy Fanny. Earleen Newport, DO

## 2019-02-11 ENCOUNTER — Encounter: Payer: Self-pay | Admitting: Internal Medicine

## 2019-02-11 ENCOUNTER — Telehealth: Payer: Self-pay | Admitting: Medical Oncology

## 2019-02-11 ENCOUNTER — Inpatient Hospital Stay (HOSPITAL_BASED_OUTPATIENT_CLINIC_OR_DEPARTMENT_OTHER): Payer: Medicare HMO | Admitting: Internal Medicine

## 2019-02-11 ENCOUNTER — Other Ambulatory Visit: Payer: Self-pay

## 2019-02-11 ENCOUNTER — Ambulatory Visit: Payer: Medicare HMO

## 2019-02-11 VITALS — BP 89/76 | HR 100 | Temp 97.8°F | Resp 16 | Ht 67.0 in | Wt 89.6 lb

## 2019-02-11 DIAGNOSIS — R197 Diarrhea, unspecified: Secondary | ICD-10-CM

## 2019-02-11 DIAGNOSIS — Z720 Tobacco use: Secondary | ICD-10-CM | POA: Diagnosis not present

## 2019-02-11 DIAGNOSIS — E119 Type 2 diabetes mellitus without complications: Secondary | ICD-10-CM | POA: Diagnosis not present

## 2019-02-11 DIAGNOSIS — M545 Low back pain: Secondary | ICD-10-CM | POA: Diagnosis not present

## 2019-02-11 DIAGNOSIS — Z5111 Encounter for antineoplastic chemotherapy: Secondary | ICD-10-CM

## 2019-02-11 DIAGNOSIS — C3411 Malignant neoplasm of upper lobe, right bronchus or lung: Secondary | ICD-10-CM | POA: Diagnosis not present

## 2019-02-11 DIAGNOSIS — Z7189 Other specified counseling: Secondary | ICD-10-CM

## 2019-02-11 DIAGNOSIS — C3491 Malignant neoplasm of unspecified part of right bronchus or lung: Secondary | ICD-10-CM

## 2019-02-11 DIAGNOSIS — C784 Secondary malignant neoplasm of small intestine: Secondary | ICD-10-CM | POA: Diagnosis not present

## 2019-02-11 DIAGNOSIS — R599 Enlarged lymph nodes, unspecified: Secondary | ICD-10-CM | POA: Diagnosis not present

## 2019-02-11 DIAGNOSIS — Z452 Encounter for adjustment and management of vascular access device: Secondary | ICD-10-CM | POA: Diagnosis not present

## 2019-02-11 DIAGNOSIS — R110 Nausea: Secondary | ICD-10-CM | POA: Diagnosis not present

## 2019-02-11 DIAGNOSIS — Z79899 Other long term (current) drug therapy: Secondary | ICD-10-CM | POA: Diagnosis not present

## 2019-02-11 NOTE — Telephone Encounter (Signed)
Pt declined IVF today . Gave him coke and coffee per his request.

## 2019-02-11 NOTE — Progress Notes (Signed)
[place] [phone]   [phone]  OFFICE PROGRESS NOTE  Sid Falcon, MD [place] [address]  DIAGNOSIS: Stage IV (T1b, N0, M1b) non-small cell lung cancer, adenocarcinoma presented with right upper lobe lung nodule and recent metastasis to the small intestine. This was initially diagnosed in September 2017.  Genomic Alterations Identified? ERBB2 amplification - equivocal? CDKN2A p16INK4a E88* and p14ARF N277O SMARCA4 splice site [id number]>XV SPTA1 E2022* TOP2A amplification TP53 A159P Additional Findings? Microsatellite status MS-Stable Tumor Mutation Burden TMB-Intermediate; 18 Muts/Mb Additional Disease-relevant Genes with No Reportable Alterations Identified? EGFR KRAS ALK BRAF MET RET ROS1   PRIOR THERAPY:  1) Status post right VATS with right upper lobectomy and mediastinal lymph node dissection under the care of Dr. Roxan Hockey on 10/18/2015 and the final pathology was consistent with stage IA (T1b, N0, MX). 2) upper endoscopy on 01/05/2016 showed normal esophagus, normal stomach but there was occasional mass around 3.0 CM in length circumferential nonobstructing in the jejunum. The final pathology was consistent with metastatic adenocarcinoma. 3) status post laparoscopic laparotomy and resection of proximal lesion and and distal jejunum/proximal ileum under the care of Dr. Hassell Done 1 02/27/2016. 3)  Systemic chemotherapy with carboplatin for AUC of 5 and Alimta 500 MG/M2 every 3 weeks. First dose 04/04/2016. Status post 2 cycles. Last dose was given 04/21/2016 discontinued secondary to disease progression. 4) Second line immunotherapy with Ketruda 200 mg IV every 2 weeks, first dose 05/30/2016. Status post 40 cycles.  Last dose October 22, 2018.  Discontinued today secondary to disease progression. 5)  Systemic chemotherapy with docetaxel 75 mg/M2 and Cyramza 10 mg/KG every 3 weeks.  First dose November 19, 2018.  This treatment was discontinued secondary to disease progression.  CURRENT THERAPY: None.   INTERVAL HISTORY: Brett [name] 59 y.o. male returns to the clinic today for follow-up visit accompanied by his wife.  The patient continues to have more weight loss and fatigue as well as lack of appetite.  She also was complaining of back pain.  He has no current chest pain, shortness of breath, cough or hemoptysis.  He denied having any nausea, vomiting but has few episodes of diarrhea with no constipation.  He has no headache or visual changes.  He underwent CT-guided biopsy of one of the left peritoneal lymph nodes by interventional radiology and the preliminary report from pathology consistent with poorly differentiated carcinoma likely adenocarcinoma but further immunohistochemical stains are still pending.  The patient is here today for evaluation and discussion of his treatment options.  MEDICAL HISTORY: Past Medical History:  Diagnosis Date  . Barrett's esophagus 07/01/2013   Without dysplasia on biopsy 09/03/2012. Repeat EGD recommended 08/2015  . Bilateral cataracts 02/13/2017  . Carotid artery stenosis 07/01/2013   Requiring right sided stent   . Chronic pain syndrome 07/01/2013  . Closed head injury with brief loss of consciousness ([place]) 07/22/2010   Head trapped in a hydraulic device at work.  Fracture of orbital bones on right and brief loss of consciousness per report.  . 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 There was likely a strong psycho-social overlay in regard to the cognitive deficits in the form of mood disorder with psychotic features and mixed anxiety symptomatology. His primary tested cognitive deficits are in the areas of attention, executi  . Daily headache "since 07/2010"  constantly  . Degenerative joint disease of cervical  spine 07/01/2013  . Dupuytren's contracture of both hands 04/08/2014  . Encounter for antineoplastic chemotherapy 10/05/2015  . Encounter for antineoplastic immunotherapy 05/24/2016  . Erectile dysfunction associated with type 2 diabetes mellitus ([place]) 07/01/2013  . Fibromyalgia 07/01/2013  . Goals of care, counseling/discussion 03/28/2016  . History of blood transfusion 11/28/2015   "suppose to get his first today" (11/28/2015)  . Hyperlipidemia LDL goal < 100 07/01/2013  . Intractable hiccups 04/11/2016  . 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 Memory changes    "memory issues" from head injury  . Moderate protein-calorie malnutrition ([place]) 11/29/2015  . Osteoarthritis of right thumb 10/21/2014  . Peripheral vascular occlusive disease ([place]) 07/01/2013   Requiring 2 arterial stents above the left knee per report  . Pneumonia ~ 2006/2007  . Post traumatic stress disorder 07/01/2013  . Primary lung adenocarcinoma ([place]) dx'd 08/2015   "right lung"  . Severe major depression with psychotic features ([place]) 04/15/2011  . Tobacco abuse 07/01/2013  . Type 2 diabetes mellitus with vascular disease ([place]) 07/01/2013   Left lower extremity and right carotid stenting    ALLERGIES:  is allergic to gabapentin; lyrica [pregabalin]; jardiance [empagliflozin]; celebrex [celecoxib]; and contrast media [iodinated diagnostic agents].  MEDICATIONS:  Current Outpatient Medications  Medication Sig Dispense Refill  . ACCU-CHEK SOFTCLIX LANCETS lancets Use to test blood glucose 1-2 times daily. Dx Code E11.59 100 each 12  . aspirin EC 81 MG tablet Take 81 mg by mouth daily.     Marland Kitchen atorvastatin (LIPITOR) 40 MG tablet Take 1 tablet (40 mg total) by mouth daily. 90 tablet 3  . Blood Glucose Monitoring Suppl (ACCU-CHEK AVIVA PLUS) w/Device KIT Use to test blood glucose 1-2 times daily. Dx Code E11.59 1 kit 0  . dexamethasone (DECADRON) 4 MG tablet 4 mg p.o. twice daily the day before, day of and day after chemotherapy every 3  weeks. (Patient taking differently: Take 4 mg by mouth See admin instructions. Take 1 tablet. twice daily the day before, day of and day after chemotherapy every 3 weeks.) 40 tablet 1  . glipiZIDE (GLUCOTROL) 10 MG tablet Take 1 tablet (10 mg total) by mouth 2 (two) times daily before a meal. 180 tablet 3  . glucose blood test strip USE TO TEST BLOOD GLUCOSE 1 TO 2 TIMES DAILY 100 each 4  . insulin aspart (NOVOLOG) 100 UNIT/ML FlexPen Inject 1-9 units into the skin with your 2 biggest meals  based on the sliding scale provided to you. 5 pen 3  . Insulin Glargine (LANTUS) 100 UNIT/ML Solostar Pen Inject 5 Units into the skin daily. 15 mL 3  . Insulin Pen Needle (PEN NEEDLES) 32G X 4 MM MISC 1 pen by Does not apply route 2 (two) times daily. Dx Code E11.59 200 each 6  . levothyroxine (SYNTHROID) 50 MCG tablet Take 1 tablet (50 mcg total) by mouth daily before breakfast. 30 tablet 2  . morphine (MS CONTIN) 15 MG 12 hr tablet Take 1 tablet (15 mg total) by mouth every 12 (twelve) hours. 60 tablet 0  . oxyCODONE-acetaminophen (PERCOCET) 10-325 MG tablet Take 1 tablet by mouth every 6 (six) hours as needed for pain. 30 tablet 0  . oxyCODONE-acetaminophen (PERCOCET) 10-325 MG tablet Take 1-2 tablets by mouth every 6 (six) hours as needed for pain. 240 tablet 0  . pantoprazole (PROTONIX) 40 MG tablet TAKE 1 TABLET(40 MG) BY MOUTH DAILY (Patient taking differently: Take 40 mg by  mouth daily. ) 90 tablet 1  . predniSONE (DELTASONE) 50 MG tablet Take one tablet 13 hours , one tablet 7 hours and one tablet 1 hour prior to CT scan. 3 tablet 3  . prochlorperazine (COMPAZINE) 10 MG tablet Take 1 tablet (10 mg total) by mouth every 6 (six) hours as needed for nausea or vomiting. 30 tablet 5  . sitaGLIPtin (JANUVIA) 100 MG tablet Take 1 tablet (100 mg total) by mouth daily. 90 tablet 3   No current facility-administered medications for this visit.    SURGICAL HISTORY:  Past Surgical History:  Procedure Laterality  Date  . CAROTID STENT Right ?2014  . COLONOSCOPY N/A 11/30/2015   Procedure: COLONOSCOPY;  Surgeon: Teena Irani, MD;  Location[facility] ENDOSCOPY;  Service: Endoscopy;  Laterality: N/A;  . ESOPHAGOGASTRODUODENOSCOPY N/A 11/30/2015   Procedure: ESOPHAGOGASTRODUODENOSCOPY (EGD);  Surgeon: Teena Irani, MD;  Location[facility] ENDOSCOPY;  Service: Endoscopy;  Laterality: N/A;  . ESOPHAGOGASTRODUODENOSCOPY (EGD) WITH PROPOFOL N/A 01/05/2016   Procedure: ESOPHAGOGASTRODUODENOSCOPY (EGD) WITH PROPOFOL;  Surgeon: Teena Irani, MD;  Location: [facility] ENDOSCOPY;  Service: Endoscopy;  Laterality: N/A;  . FEMORAL ARTERY STENT Left 05/2012; ~ 2015   Archie Endo 06/04/2012; Raechel Chute report  . FRACTURE SURGERY    . GIVENS CAPSULE STUDY N/A 12/22/2015   Procedure: GIVENS CAPSULE STUDY;  Surgeon: Wonda Horner, MD;  Location: [facility] ENDOSCOPY;  Service: Endoscopy;  Laterality: N/A;  . HARDWARE REMOVAL Right 11/15/2011   Removal of deep frontozygomatic orbital hardware/notes 11/15/2011  . HERNIA REPAIR  8921   Umbilical  . LAPAROSCOPY N/A 02/27/2016   Procedure: LAPAROSCOPY, LAPAROTOMY  WITH TWO SMALL BOWEL RESECTION;  Surgeon: Johnathan Hausen, MD;  Location: [facility] ORS;  Service: General;  Laterality: N/A;  . ORIF ORBITAL FRACTURE Right 08/15/2010    caught in a hydraulic machine; open reduction internal fixation of orbital rim fracture and open reduction of zygomatic arch fracture  Archie Endo 10/13/2009  . PORTACATH PLACEMENT Left 04/04/2016   Procedure: INSERTION PORT-A-CATH LEFT CHEST;  Surgeon: Melrose Nakayama, MD;  Location: [facility];  Service: Thoracic;  Laterality: Left;  Marland Kitchen VIDEO ASSISTED THORACOSCOPY (VATS)/ LOBECTOMY Right 10/18/2015   Procedure: VIDEO ASSISTED THORACOSCOPY (VATS)/ LOBECTOMY;  Surgeon: Melrose Nakayama, MD;  Location: [facility];  Service: Thoracic;  Laterality: Right;  Marland Kitchen VIDEO BRONCHOSCOPY Bilateral 09/21/2015   Procedure: VIDEO BRONCHOSCOPY WITH FLUORO;  Surgeon: Juanito Doom, MD;  Location: [facility] ENDOSCOPY;  Service:  Cardiopulmonary;  Laterality: Bilateral;    REVIEW OF SYSTEMS:  Constitutional: positive for anorexia, fatigue and weight loss Eyes: negative Ears, nose, mouth, throat, and face: negative Respiratory: negative Cardiovascular: negative Gastrointestinal: positive for diarrhea Genitourinary:negative Integument/breast: negative Hematologic/lymphatic: negative Musculoskeletal:positive for back pain Neurological: negative Behavioral/Psych: negative Endocrine: negative Allergic/Immunologic: negative   PHYSICAL EXAMINATION: General appearance: alert, cooperative, cachectic, fatigued and no distress Head: Normocephalic, without obvious abnormality, atraumatic Neck: no adenopathy, no JVD, supple, symmetrical, trachea midline and thyroid not enlarged, symmetric, no tenderness/mass/nodules Lymph nodes: Cervical, supraclavicular, and axillary nodes normal. Resp: clear to auscultation bilaterally Back: symmetric, no curvature. ROM normal. No CVA tenderness. Cardio: regular rate and rhythm, S1, S2 normal, no murmur, click, rub or gallop GI: soft, non-tender; bowel sounds normal; no masses,  no organomegaly Extremities: extremities normal, atraumatic, no cyanosis or edema Neurologic: Alert and oriented X 3, normal strength and tone. Normal symmetric reflexes. Normal coordination and gait  ECOG PERFORMANCE STATUS: 1 - Symptomatic but completely ambulatory  Blood pressure (!) 89/76, pulse 100, temperature 97.8 F (36.6 C), temperature source  Temporal, resp. rate 16, height _0  (1.702 m), weight 89 lb 9.6 oz (40.6 kg), SpO2 93 %.  LABORATORY DATA: Lab Results  Component Value Date   WBC 5.3 02/10/2019   HGB 12.9 (L) 02/10/2019   HCT 40.4 02/10/2019   MCV 98.8 02/10/2019   PLT 263 02/10/2019      Chemistry      Component Value Date/Time   NA 136 01/19/2019 1342   NA 140 01/23/2017 0910   K 4.4 01/19/2019 1342   K 4.5 01/23/2017 0910   CL 101 01/19/2019 1342   CO2 27 01/19/2019 1342     CO2 28 01/23/2017 0910   BUN 8 01/19/2019 1342   BUN 8.1 01/23/2017 0910   CREATININE 0.44 (L) 01/19/2019 1342   CREATININE 0.8 01/23/2017 0910   GLU 398 (H) 08/08/2016 1257      Component Value Date/Time   CALCIUM 8.3 (L) 01/19/2019 1342   CALCIUM 9.6 01/23/2017 0910   ALKPHOS 141 (H) 01/19/2019 1342   ALKPHOS 113 01/23/2017 0910   AST 38 01/19/2019 1342   AST 11 01/23/2017 0910   ALT 25 01/19/2019 1342   ALT 12 01/23/2017 0910   BILITOT 0.7 01/19/2019 1342   BILITOT 0.40 01/23/2017 0910       RADIOGRAPHIC STUDIES: CT Chest W Contrast  Result Date: 01/18/2019 CLINICAL DATA:  RIGHT lung cancer. Jejunal cancer. Chemotherapy ongoing. Diffuse abdominal pain. EXAM: CT CHEST, ABDOMEN, AND PELVIS WITH CONTRAST TECHNIQUE: Multidetector CT imaging of the chest, abdomen and pelvis was performed following the standard protocol during bolus administration of intravenous contrast. CONTRAST:  51m OMNIPAQUE IOHEXOL 300 MG/ML  SOLN COMPARISON:  CT abdomen 12/08/2018, chest CT 11/10/2018 FINDINGS: CT CHEST FINDINGS Cardiovascular: No significant vascular findings. Normal heart size. No pericardial effusion. Coronary artery calcification and aortic atherosclerotic calcification. Mediastinum/Nodes: No axillary supraclavicular adenopathy. Increase in size of low-density lymph node in the high mediastinum (thoracic inlet) measuring 1.6 cm (image 16/2). No pericardial effusion. Esophagus normal. Lungs/Pleura: New irregular nodule in the RIGHT lower lobe measures 10 mm (image 122/6). Less well-defined peribronchial thickening in the LEFT upper lobe on image 78/6) Musculoskeletal: No aggressive osseous lesion. CT ABDOMEN AND PELVIS FINDINGS Hepatobiliary: Low-attenuation liver. Enhancing lesion in the inferior inferior LEFT hepatic lobe adjacent to the gallbladder fossa measuring 11 mm on image 64/2. Pancreas: Pancreas is normal. No ductal dilatation. No pancreatic inflammation. Spleen: Normal spleen  Adrenals/urinary tract: Adrenal glands and kidneys are normal. The ureters and bladder normal. Stomach/Bowel: Medications in the stomach. Duodenum and small-bowel normal. There is anastomosis in the small bowel. Terminal ileum normal. Ascending, transverse and descending colon normal. Rectosigmoid colon normal. Vascular/Lymphatic: Calcification abdominal aorta. Interval increase in size of low-density periaortic retroperitoneal lymphadenopathy. For example 3.6 cm nodal mass adjacent to the LEFT kidney compares to 2.7 cm on CT 12/08/2018. Lymph node complex deep to the IVC at the same level measures 3.2 cm (image 73/2) compared to 2.9 cm. Nodal implant within the central mesentery measuring 2.5 by 1.6 cm (image 73/2) increased from 2.5 x 1.1 cm. Reproductive: Prostate normal. Other: No free fluid. Musculoskeletal: No aggressive osseous lesion. IMPRESSION: Chest Impression: 1. New nodularity in the RIGHT lower lobe is concerning for metastatic lesion. Focus of pulmonary infection would be secondary differential. Recommend close attention on follow-up. 2. Peribronchial thickening in the LEFT upper lobe warrants attention on follow-up. 3. Metastatic lymph node in the thoracic inlet/prevascular space. Abdomen / Pelvis Impression: 1. Interval increase in size of bulky low-density periaortic retroperitoneal  lymphadenopathy. 2. Interval increase in size of mesenteric nodal implant. 3. Enhancing lesion in the inferior LEFT hepatic lobe. Differential includes hepatic metastasis versus focal fatty sparing. Location favors focal fatty sparing. Recommend attention on follow-up. 4. Postsurgical change consistent bowel resection. No evidence local recurrence. Electronically Signed   By: Suzy Bouchard M.D.   On: 01/18/2019 16:58   CT Abdomen Pelvis W Contrast  Result Date: 01/18/2019 CLINICAL DATA:  RIGHT lung cancer. Jejunal cancer. Chemotherapy ongoing. Diffuse abdominal pain. EXAM: CT CHEST, ABDOMEN, AND PELVIS WITH  CONTRAST TECHNIQUE: Multidetector CT imaging of the chest, abdomen and pelvis was performed following the standard protocol during bolus administration of intravenous contrast. CONTRAST:  48m OMNIPAQUE IOHEXOL 300 MG/ML  SOLN COMPARISON:  CT abdomen 12/08/2018, chest CT 11/10/2018 FINDINGS: CT CHEST FINDINGS Cardiovascular: No significant vascular findings. Normal heart size. No pericardial effusion. Coronary artery calcification and aortic atherosclerotic calcification. Mediastinum/Nodes: No axillary supraclavicular adenopathy. Increase in size of low-density lymph node in the high mediastinum (thoracic inlet) measuring 1.6 cm (image 16/2). No pericardial effusion. Esophagus normal. Lungs/Pleura: New irregular nodule in the RIGHT lower lobe measures 10 mm (image 122/6). Less well-defined peribronchial thickening in the LEFT upper lobe on image 78/6) Musculoskeletal: No aggressive osseous lesion. CT ABDOMEN AND PELVIS FINDINGS Hepatobiliary: Low-attenuation liver. Enhancing lesion in the inferior inferior LEFT hepatic lobe adjacent to the gallbladder fossa measuring 11 mm on image 64/2. Pancreas: Pancreas is normal. No ductal dilatation. No pancreatic inflammation. Spleen: Normal spleen Adrenals/urinary tract: Adrenal glands and kidneys are normal. The ureters and bladder normal. Stomach/Bowel: Medications in the stomach. Duodenum and small-bowel normal. There is anastomosis in the small bowel. Terminal ileum normal. Ascending, transverse and descending colon normal. Rectosigmoid colon normal. Vascular/Lymphatic: Calcification abdominal aorta. Interval increase in size of low-density periaortic retroperitoneal lymphadenopathy. For example 3.6 cm nodal mass adjacent to the LEFT kidney compares to 2.7 cm on CT 12/08/2018. Lymph node complex deep to the IVC at the same level measures 3.2 cm (image 73/2) compared to 2.9 cm. Nodal implant within the central mesentery measuring 2.5 by 1.6 cm (image 73/2) increased from  2.5 x 1.1 cm. Reproductive: Prostate normal. Other: No free fluid. Musculoskeletal: No aggressive osseous lesion. IMPRESSION: Chest Impression: 1. New nodularity in the RIGHT lower lobe is concerning for metastatic lesion. Focus of pulmonary infection would be secondary differential. Recommend close attention on follow-up. 2. Peribronchial thickening in the LEFT upper lobe warrants attention on follow-up. 3. Metastatic lymph node in the thoracic inlet/prevascular space. Abdomen / Pelvis Impression: 1. Interval increase in size of bulky low-density periaortic retroperitoneal lymphadenopathy. 2. Interval increase in size of mesenteric nodal implant. 3. Enhancing lesion in the inferior LEFT hepatic lobe. Differential includes hepatic metastasis versus focal fatty sparing. Location favors focal fatty sparing. Recommend attention on follow-up. 4. Postsurgical change consistent bowel resection. No evidence local recurrence. Electronically Signed   By: SSuzy BouchardM.D.   On: 01/18/2019 16:58   NM PET Image Restag (PS) Skull Base To Thigh  Result Date: 02/02/2019 CLINICAL DATA:  Subsequent treatment strategy for non-small cell lung cancer. EXAM: NUCLEAR MEDICINE PET SKULL BASE TO THIGH TECHNIQUE: 4.97 mCi F-18 FDG was injected intravenously. Full-ring PET imaging was performed from the skull base to thigh after the radiotracer. CT data was obtained and used for attenuation correction and anatomic localization. Fasting blood glucose: 127 mg/dl COMPARISON:  CT chest, abdomen and pelvis 01/18/2019 and multiple prior studies, including PET evaluation from 10/03/2015. FINDINGS: Mediastinal blood pool activity: SUV max 1.72  Liver activity: SUV max 1.99 NECK: Ovoid low-density lymph node in the left low neck, level IV (image 50, series 4) 1.3 cm (SUVmax = 10.90) Slightly enlarged compared to the chest CT of 01/18/2027 but not present on the exam, PET exam of 10/03/2015. Incidental CT findings: Signs of bilateral maxillary  sinus disease worse than on the prior study with near complete opacification of the left maxillary sinus. Evidence of right carotid stenting. CHEST: Necrotic appearing lymph node at the thoracic inlet measuring 1.7 cm in short axis within 2 mm of most recent comparison (SUVmax = 5.33) Retrotracheal lymph node with necrosis unchanged from previous CT, not present on more remote PET examination measuring 7 mm (image 68, series 4) (SUVmax = 5.84) Similarly hypermetabolic small, subcentimeter lymph node along the right paratracheal chain measuring 7-8 mm (image 66, series 4) no signs of hilar or axillary adenopathy with hypermetabolic features. Basilar nodularity on the right just above the right hemidiaphragm new compared to the PET exam of 10/03/2015 (image 97, series 4) 9 mm and (image 100, series 4) 6 mm, background FDG uptake less than blood pool or liver. Smaller nodules are seen in this location, clusters of nodules. Incidental CT findings: Left subclavian Port-A-Cath terminates in the distal superior vena cava. Heart size is normal without pericardial effusion and extensive coronary artery calcification. Scattered aortic calcification. Central pulmonary vasculature is of normal caliber. Vessels not well assessed given lack of intravenous contrast. Esophagus grossly normal by CT. Signs of prior partial lung resection in the right chest related to right upper lobectomy. ABDOMEN/PELVIS: Bulky retroperitoneal adenopathy with signs of necrosis in the upper abdomen and retrocrural. Left para-aortic nodal group measures 3.6 similar to prior study (SUVmax = 6.49) not present on the previous imaging study. Intra-aortocaval nodal group measures 2.6 cm in short axis, also similar to previous exam. X 3.2 cm. (SUVmax = 6.11) Nodal group in the root of the small bowel mesentery (image 125, series 4) 2 cm short axis, not changed in terms of size. (SUVmax = 7.57 No signs of solid lesion with uptake to suggest metastatic disease  to abdominal viscera. No signs of pelvic adenopathy.) Incidental CT findings: Marked hepatic steatosis. Sludge layers dependently in the gallbladder. Spleen is normal size. Pancreas is atrophic. Adrenal glands are normal. Noncontrast appearance of kidneys is normal. Signs of prior bowel resection with mesenteric nodal disease as discussed. No acute bowel process. Small volume free fluid in the pelvis is nonspecific and similar to the prior exam. SKELETON: No focal hypermetabolic activity to suggest skeletal metastasis. Incidental CT findings: Spinal degenerative changes. IMPRESSION: 1. Bulky adenopathy in the neck, chest and abdomen with similar appearance when compared to the study of December of 2020 showing hypermetabolic features but with areas of necrosis. Left supraclavicular lymph node immediately deep to skin surface in the left low neck at level IV is intensely hypermetabolic. 2. Less pronounced uptake and small pulmonary lesions with some grouped nodularity, potentially related to infectious or inflammatory process. Attention on follow-up is suggested. 3. Marked hepatic steatosis. Electronically Signed   By: Zetta Bills M.D.   On: 02/02/2019 13:34   CT Biopsy  Result Date: 02/10/2019 INDICATION: 59 year old male with a history of non-small cell carcinoma with new retroperitoneal lymphadenopathy, referred for biopsy EXAM: CT BIOPSY MEDICATIONS: None. ANESTHESIA/SEDATION: Moderate (conscious) sedation was employed during this procedure. A total of Versed 1.0 mg and Fentanyl 25 mcg was administered intravenously. Moderate Sedation Time: 17 minutes. The patient's level of consciousness and vital signs  were monitored continuously by radiology nursing throughout the procedure under my direct supervision. FLUOROSCOPY TIME:  CT COMPLICATIONS: None PROCEDURE: Informed written consent was obtained from the patient after a thorough discussion of the procedural risks, benefits and alternatives. All questions  were addressed. Maximal Sterile Barrier Technique was utilized including caps, mask, sterile gowns, sterile gloves, sterile drape, hand hygiene and skin antiseptic. A timeout was performed prior to the initiation of the procedure. Patient position prone position on CT gantry table. Scout CT was acquired for planning purposes. The patient is prepped and draped in the usual sterile fashion. The skin and subcutaneous tissues were generously infiltrated 1% lidocaine for local anesthesia. Using CT guidance, trocar guide needle was advanced into the region of the left retroperitoneal lymph nodes which demonstrated PET avid activity on the prior PET-CT. Once we confirmed needle tip position multiple (at least 10) 18 gauge core biopsy were acquired. Patient tolerated the procedure well and remained hemodynamically stable throughout. No complications were encountered and no significant blood loss. IMPRESSION: Status post CT-guided biopsy of left retroperitoneal lymph nodes, targeting the region of PET avid activity at the medial margin. Signed, Dulcy Fanny. Dellia Nims, RPVI [facility] Radiology Electronically Signed   By: Corrie Mckusick D.O.   On: 02/10/2019 12:54     ASSESSMENT AND PLAN:  This is a very pleasant 60 years old white male with metastatic non-small cell lung cancer, adenocarcinoma status post right upper lobectomy with lymph node dissection. Unfortunately the patient was found to have metastatic disease in the jejunum and terminal ileum.  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 The patient was started on treatment with systemic chemotherapy with carboplatin and Alimta for 2 cycles discontinued secondary to intolerance and disease progression. The patient completed treatment with second line immunotherapy with Ketruda  (pembrolizumab) 200 mg IV every 3 weeks, status post 40 cycles.  He has been tolerating this treatment well. This treatment was discontinued secondary to disease progression. The patient was started on systemic chemotherapy with docetaxel and Cyramza status post 2 cycles.  This treatment was discontinued secondary to disease progression. I ordered a PET scan which was performed earlier today and it showed bulky adenopathy in the neck, chest as well as abdomen with similar appearance to the previous CT scan in December 2020 and showing hypermetabolic features. The patient underwent CT-guided core biopsy of one of the hypermetabolic left retroperitoneal lymph nodes by interventional radiology and the final pathology is consistent with poorly differentiated adenocarcinoma but further histochemical staining are still pending. I had a lengthy discussion with the patient today about his current condition and treatment options.  Unfortunately the patient has very poor prognosis.  I gave him the option of palliative care and hospice referral versus consideration of systemic chemotherapy with single agent gemcitabine 1000 mg/M2 on days 1 and 8 every 3 weeks.  I explained to the patient that the response rate for this treatment on the fourth line setting is probably in the range of 10 to 15%.  The patient and his wife would like some time to think about their options before making a decision. For the dehydration and hypotension, I offered the patient IV fluid in the clinic today but he would like to increase his oral intake and was not interested in the IV fluid. For the diarrhea, he will continue on Imodium. For the  diabetes mellitus he is followed by Dr. Daryll Drown at the [facility] internal medicine teaching program. For the back pain, this is likely secondary to the retroperitoneal lymphadenopathy as well as previous history was arthritis.  He will continue on oxycodone for now and I will give him refill as  needed. I will wait to hear from the patient regarding the next step and whether he is interested in systemic chemotherapy or palliative care on hospice. He was advised to call immediately if he has any other concerning symptoms in the interval. The patient voices understanding of current disease status and treatment options and is in agreement with the current care plan. All questions were answered. The patient knows to call the clinic with any problems, questions or concerns. We can certainly see the patient much sooner if necessary.  Disclaimer: This note was dictated with voice recognition software. Similar sounding words can inadvertently be transcribed and may not be corrected upon review.

## 2019-02-12 ENCOUNTER — Telehealth: Payer: Self-pay | Admitting: Physician Assistant

## 2019-02-12 NOTE — Telephone Encounter (Signed)
The patient's wife called and asked further clarification questions about possibly moving forward with treatment. She states that the patient is interested in treatment. I will relay this message to scheduling as well as Dr. Julien Nordmann.

## 2019-02-13 ENCOUNTER — Other Ambulatory Visit: Payer: Self-pay | Admitting: Internal Medicine

## 2019-02-13 NOTE — Progress Notes (Signed)
DISCONTINUE ON PATHWAY REGIMEN - Non-Small Cell Lung     A cycle is every 21 days:     Ramucirumab      Docetaxel   **Always confirm dose/schedule in your pharmacy ordering system**  REASON: Disease Progression PRIOR TREATMENT: [id number]: Docetaxel 75 mg/m2 + Ramucirumab 10 mg/kg q21 Days Until Progression or Unacceptable Toxicity TREATMENT RESPONSE: Progressive Disease (PD)  START OFF PATHWAY REGIMEN - Non-Small Cell Lung   [id number]:Gemcitabine 1,000 mg/m2 D1, 8  q21 Days:   A cycle is every 21 days:     Gemcitabine   **Always confirm dose/schedule in your pharmacy ordering system**  Patient Characteristics: Stage IV Metastatic, Nonsquamous, Third Line - Chemotherapy/Immunotherapy, PS = 0, 1, Prior PD-1/PD-L1 Inhibitor or No Prior PD-1/PD-L1 Inhibitor and Not a Candidate for Immunotherapy AJCC T Category: Staged < 8th Ed. Current Disease Status: Distant Metastases AJCC N Category: N0 AJCC M Category: M1b AJCC 8 Stage Grouping: IVA Histology: Nonsquamous Cell ROS1 Rearrangement Status: Negative T790M Mutation Status: Not Applicable - EGFR Mutation Negative/Unknown Other Mutations/Biomarkers: No Other Actionable Mutations Chemotherapy/Immunotherapy LOT: Third Line Chemotherapy/Immunotherapy Molecular Targeted Therapy: Not Appropriate MET Exon 14 Mutation Status: Negative RET Gene Fusion Status: Negative EGFR Mutation Status: Negative/Wild Type NTRK Gene Fusion Status: Negative PD-L1 Expression Status: Quantity Not Sufficient ALK Rearrangement Status: Negative BRAF V600E Mutation Status: Negative ECOG Performance Status: 1 Immunotherapy Candidate Status: Not a Candidate for Immunotherapy Prior Immunotherapy Status: Prior PD-1/PD-L1 Inhibitor Intent of Therapy: Non-Curative / Palliative Intent, Discussed with Patient

## 2019-02-15 ENCOUNTER — Other Ambulatory Visit: Payer: Self-pay | Admitting: Internal Medicine

## 2019-02-15 DIAGNOSIS — G894 Chronic pain syndrome: Secondary | ICD-10-CM

## 2019-02-15 LAB — SURGICAL PATHOLOGY

## 2019-02-15 NOTE — Telephone Encounter (Signed)
Last rx written 01/13/19 Last OV 10/2 with PCP; 11/19 in [place]. Next OV has not been scheduled. UDS  10/03/17.

## 2019-02-15 NOTE — Telephone Encounter (Signed)
Refill Request- Pt will be completely out by tomorrow. Pt is in a lot of pain and is aware he is asking for his pain medication early.  oxyCODONE-acetaminophen (PERCOCET) 10-325 MG tablet   [id number] - [place],  - [address] AT [address]

## 2019-02-16 ENCOUNTER — Other Ambulatory Visit: Payer: Self-pay | Admitting: Internal Medicine

## 2019-02-16 DIAGNOSIS — E1159 Type 2 diabetes mellitus with other circulatory complications: Secondary | ICD-10-CM

## 2019-02-16 MED ORDER — OXYCODONE-ACETAMINOPHEN 10-325 MG PO TABS: 1.0000 | ORAL_TABLET | Freq: Four times a day (QID) | ORAL | 0 refills | Status: DC | PRN

## 2019-02-17 ENCOUNTER — Telehealth: Payer: Self-pay | Admitting: Internal Medicine

## 2019-02-17 NOTE — Progress Notes (Signed)
[facility] PROGRESS NOTE  Sid Falcon, MD [place] [address]  DIAGNOSIS: Stage IV (T1b, N0, M1b) non-small cell lung cancer, adenocarcinoma presented with right upper lobe lung nodule and recent metastasis to the small intestine. This was initially diagnosed in September 2017  Genomic Alterations Identified? ERBB2 amplification -equivocal? CDKN2A p16INK4a E88* and p14ARF G956O SMARCA4 splice site [id number]>GE SPTA1 E2022* TOP2A amplification TP53 A159P Additional Findings? Microsatellite status MS-Stable Tumor Mutation Burden TMB-Intermediate; 18 Muts/Mb Additional Disease-relevant Genes with No Reportable Alterations Identified? EGFR KRAS ALK BRAF MET RET ROS1   PRIOR THERAPY: 1) Status post right VATS with right upper lobectomy and mediastinal lymph node dissection under the care of Dr. Roxan Hockey on 10/18/2015 and the final pathology was consistent with stage IA (T1b, N0, MX). 2) upper endoscopy on 01/05/2016 showed normal esophagus, normal stomach but there was occasional mass around 3.0 CM in length circumferential nonobstructing in the jejunum. The final pathology was consistent with metastatic adenocarcinoma. 3) status post laparoscopic laparotomy and resection of proximal lesion and and distal jejunum/proximal ileum under the care of Dr. Hassell Done 1 02/27/2016. 3) Systemic chemotherapy with carboplatin for AUC of 5 and Alimta 500 MG/M2 every 3 weeks. First dose 04/04/2016. Status post 2 cycles. Last dose was given 04/21/2016 discontinued secondary to disease progression. 4)Second line immunotherapy with Ketruda 200 mg IV every 2 weeks, first dose 05/30/2016. Status post40cycles.Last dose October 22, 2018. Discontinued today secondary to disease progression. 5) Systemic chemotherapy with docetaxel 75 mg/M2 and Cyramza 10 mg/KG every 3 weeks. First dose November 19, 2018. Status post 2 cycle. Dose of Docetaxel reduced to 60  mg/m2 starting on cycle #2. Last dose on 12/31/2018. Discontinued due to evidence of disease progression.   CURRENT THERAPY: Systemic chemotherapy with gemcitabine 1,000 mg/m2 on days 1 and 8 every 3 weeks.  First dose expected on 02/18/2019.  INTERVAL HISTORY: Brett [name] 59 y.o. male returns to the clinic for follow-up visit.  The patient recently had a CT-guided biopsy of one of the left peritoneal lymph nodes which was consistent with poorly differentiated carcinoma with morphologic appearance is closely similar to the previously diagnosed metastatic tumor to his small bowel.   Today, the patient continues to experience diminished appetite. He states that when he looks at food, he does not want it. He has been using carnation essentials and tries to use it twice daily. He gained about 3 pounds since his last appointment but he is still very cachetic.   He also is experiencing back pain likely secondary to the retroperitoneal lymphadenopathy as well as his prior history of arthritis.  He is currently using oxycodone for pain which he states helps take the edge off.   In his last appointment, the patient and the option of a referral to hospice/palliative care versus consideration of systemic chemotherapy with single agent gemcitabine.  The patient is wife opted to consider their options before making decision.  The patient decided that he would like to pursue systemic chemotherapy with gemcitabine and he is scheduled to start that today.  He denies any recent fever, chills, or night sweats.  Denies chest pain, shortness of breath, cough, or hemoptysis. Denies any vomiting, or constipation. He reports some nausea for which he takes compazine.  He denies any headache or visual changes.  The patient is here today for evaluation before starting his first cycle of treatment with gemcitabine.   MEDICAL HISTORY: Past Medical History:  Diagnosis Date  .  Barrett's esophagus 07/01/2013   Without dysplasia  on biopsy 09/03/2012. Repeat EGD recommended 08/2015  . Bilateral cataracts 02/13/2017  . Carotid artery stenosis 07/01/2013   Requiring right sided stent   . Chronic pain syndrome 07/01/2013  . Closed head injury with brief loss of consciousness ([place]) 07/22/2010   Head trapped in a hydraulic device at work.  Fracture of orbital bones on right and brief loss of consciousness per report.  . 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 There was likely a strong psycho-social overlay in regard to the cognitive deficits in the form of mood disorder with psychotic features and mixed anxiety symptomatology. His primary tested cognitive deficits are in the areas of attention, executi  . Daily headache "since 07/2010"   constantly  . Degenerative joint disease of cervical spine 07/01/2013  . Dupuytren's contracture of both hands 04/08/2014  . Encounter for antineoplastic chemotherapy 10/05/2015  . Encounter for antineoplastic immunotherapy 05/24/2016  . Erectile dysfunction associated with type 2 diabetes mellitus ([place]) 07/01/2013  . Fibromyalgia 07/01/2013  . Goals of care, counseling/discussion 03/28/2016  . History of blood transfusion 11/28/2015   "suppose to get his first today" (11/28/2015)  . Hyperlipidemia LDL goal < 100 07/01/2013  . Intractable hiccups 04/11/2016  . 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 Memory changes    "memory issues" from head injury  . Moderate protein-calorie malnutrition ([place]) 11/29/2015  . Osteoarthritis of right thumb 10/21/2014  . Peripheral vascular occlusive disease ([place]) 07/01/2013   Requiring 2 arterial stents above the left knee per report  . Pneumonia ~ 2006/2007  . Post traumatic stress disorder 07/01/2013  . Primary lung adenocarcinoma ([place]) dx'd 08/2015   "right lung"  . Severe major depression with psychotic features ([place])  04/15/2011  . Tobacco abuse 07/01/2013  . Type 2 diabetes mellitus with vascular disease ([place]) 07/01/2013   Left lower extremity and right carotid stenting    ALLERGIES:  is allergic to gabapentin; lyrica [pregabalin]; jardiance [empagliflozin]; celebrex [celecoxib]; and contrast media [iodinated diagnostic agents].  MEDICATIONS:  Current Outpatient Medications  Medication Sig Dispense Refill  . ACCU-CHEK SOFTCLIX LANCETS lancets Use to test blood glucose 1-2 times daily. Dx Code E11.59 100 each 12  . aspirin EC 81 MG tablet Take 81 mg by mouth daily.     Marland Kitchen atorvastatin (LIPITOR) 40 MG tablet TAKE 1 TABLET(40 MG) BY MOUTH DAILY 90 tablet 3  . Blood Glucose Monitoring Suppl (ACCU-CHEK AVIVA PLUS) w/Device KIT Use to test blood glucose 1-2 times daily. Dx Code E11.59 1 kit 0  . dexamethasone (DECADRON) 4 MG tablet 4 mg p.o. twice daily the day before, day of and day after chemotherapy every 3 weeks. (Patient taking differently: Take 4 mg by mouth See admin instructions. Take 1 tablet. twice daily the day before, day of and day after chemotherapy every 3 weeks.) 40 tablet 1  . dronabinol (MARINOL) 2.5 MG capsule Take 1 capsule (2.5 mg total) by mouth 2 (two) times daily before a meal. 60 capsule 0  . glipiZIDE (GLUCOTROL) 10 MG tablet Take 1 tablet (10 mg total) by mouth 2 (two) times daily before a meal. 180 tablet 3  . glucose blood test strip USE TO TEST BLOOD GLUCOSE 1 TO 2 TIMES DAILY 100 each 4  . insulin aspart (NOVOLOG) 100 UNIT/ML FlexPen Inject 1-9 units into the skin with your 2 biggest meals  based  on the sliding scale provided to you. 5 pen 3  . Insulin Glargine (LANTUS) 100 UNIT/ML Solostar Pen Inject 5 Units into the skin daily. 15 mL 3  . Insulin Pen Needle (PEN NEEDLES) 32G X 4 MM MISC 1 pen by Does not apply route 2 (two) times daily. Dx Code E11.59 200 each 6  . levothyroxine (SYNTHROID) 50 MCG tablet Take 1 tablet (50 mcg total) by mouth daily before breakfast. 30 tablet 2  .  morphine (MS CONTIN) 15 MG 12 hr tablet Take 1 tablet (15 mg total) by mouth every 12 (twelve) hours. 60 tablet 0  . oxyCODONE-acetaminophen (PERCOCET) 10-325 MG tablet Take 1 tablet by mouth every 6 (six) hours as needed for pain. 30 tablet 0  . oxyCODONE-acetaminophen (PERCOCET) 10-325 MG tablet Take 1-2 tablets by mouth every 6 (six) hours as needed for pain. 240 tablet 0  . pantoprazole (PROTONIX) 40 MG tablet TAKE 1 TABLET(40 MG) BY MOUTH DAILY (Patient taking differently: Take 40 mg by mouth daily. ) 90 tablet 1  . predniSONE (DELTASONE) 50 MG tablet Take one tablet 13 hours , one tablet 7 hours and one tablet 1 hour prior to CT scan. 3 tablet 3  . prochlorperazine (COMPAZINE) 10 MG tablet Take 1 tablet (10 mg total) by mouth every 6 (six) hours as needed for nausea or vomiting. 30 tablet 5  . sitaGLIPtin (JANUVIA) 100 MG tablet Take 1 tablet (100 mg total) by mouth daily. 90 tablet 3   No current facility-administered medications for this visit.   Facility-Administered Medications Ordered in Other Visits  Medication Dose Route Frequency Provider Last Rate Last Admin  . gemcitabine (GEMZAR) 1,406 mg in sodium chloride 0.9 % 250 mL chemo infusion  1,000 mg/m2 (Treatment Plan Recorded) Intravenous Once Curt Bears, MD 574 mL/hr at 02/18/19 1448 1,406 mg at 02/18/19 1448  . heparin lock flush 100 unit/mL  500 Units Intracatheter Once PRN Curt Bears, MD      . sodium chloride flush (NS) 0.9 % injection 10 mL  10 mL Intracatheter PRN Curt Bears, MD        SURGICAL HISTORY:  Past Surgical History:  Procedure Laterality Date  . CAROTID STENT Right ?2014  . COLONOSCOPY N/A 11/30/2015   Procedure: COLONOSCOPY;  Surgeon: Teena Irani, MD;  Location: [facility] ENDOSCOPY;  Service: Endoscopy;  Laterality: N/A;  . ESOPHAGOGASTRODUODENOSCOPY N/A 11/30/2015   Procedure: ESOPHAGOGASTRODUODENOSCOPY (EGD);  Surgeon: Teena Irani, MD;  Location: [facility] ENDOSCOPY;  Service: Endoscopy;  Laterality: N/A;  .  ESOPHAGOGASTRODUODENOSCOPY (EGD) WITH PROPOFOL N/A 01/05/2016   Procedure: ESOPHAGOGASTRODUODENOSCOPY (EGD) WITH PROPOFOL;  Surgeon: Teena Irani, MD;  Location: [facility] ENDOSCOPY;  Service: Endoscopy;  Laterality: N/A;  . FEMORAL ARTERY STENT Left 05/2012; ~ 2015   Archie Endo 06/04/2012; Raechel Chute report  . FRACTURE SURGERY    . GIVENS CAPSULE STUDY N/A 12/22/2015   Procedure: GIVENS CAPSULE STUDY;  Surgeon: Wonda Horner, MD;  Location: [facility] ENDOSCOPY;  Service: Endoscopy;  Laterality: N/A;  . HARDWARE REMOVAL Right 11/15/2011   Removal of deep frontozygomatic orbital hardware/notes 11/15/2011  . HERNIA REPAIR  0626   Umbilical  . LAPAROSCOPY N/A 02/27/2016   Procedure: LAPAROSCOPY, LAPAROTOMY  WITH TWO SMALL BOWEL RESECTION;  Surgeon: Johnathan Hausen, MD;  Location: [facility] ORS;  Service: General;  Laterality: N/A;  . ORIF ORBITAL FRACTURE Right 08/15/2010    caught in a hydraulic machine; open reduction internal fixation of orbital rim fracture and open reduction of zygomatic arch fracture  Archie Endo 10/13/2009  . PORTACATH  PLACEMENT Left 04/04/2016   Procedure: INSERTION PORT-A-CATH LEFT CHEST;  Surgeon: Melrose Nakayama, MD;  Location: [facility];  Service: Thoracic;  Laterality: Left;  Marland Kitchen VIDEO ASSISTED THORACOSCOPY (VATS)/ LOBECTOMY Right 10/18/2015   Procedure: VIDEO ASSISTED THORACOSCOPY (VATS)/ LOBECTOMY;  Surgeon: Melrose Nakayama, MD;  Location: [facility];  Service: Thoracic;  Laterality: Right;  Marland Kitchen VIDEO BRONCHOSCOPY Bilateral 09/21/2015   Procedure: VIDEO BRONCHOSCOPY WITH FLUORO;  Surgeon: Juanito Doom, MD;  Location: [facility] ENDOSCOPY;  Service: Cardiopulmonary;  Laterality: Bilateral;    REVIEW OF SYSTEMS:   Review of Systems  Constitutional: Positive for fatigue, generalized weakness, decreased appetite. Negative for  chills, fever.Marland Kitchen  HENT: Negative for mouth sores, nosebleeds, sore throat and trouble swallowing.   Eyes: Negative for eye problems and icterus.  Respiratory: Negative for cough, hemoptysis,  shortness of breath and wheezing.   Cardiovascular: Negative for chest pain and leg swelling.  Gastrointestinal: Negative for abdominal pain, constipation, diarrhea, nausea and vomiting.  Genitourinary: Negative for bladder incontinence, difficulty urinating, dysuria, frequency and hematuria.   Musculoskeletal: Negative for back pain, gait problem, neck pain and neck stiffness.  Skin: Negative for itching and rash.  Neurological: Negative for dizziness, extremity weakness, gait problem, headaches, light-headedness and seizures.  Hematological: Negative for adenopathy. Does not bruise/bleed easily.  Psychiatric/Behavioral: Negative for confusion, depression and sleep disturbance. The patient is not nervous/anxious.     PHYSICAL EXAMINATION:  Blood pressure 104/76, pulse (!) 112, temperature 98.3 F (36.8 C), temperature source Temporal, resp. rate 18, height '5\' 7"'  (1.702 m), weight 92 lb 8 oz (42 kg), SpO2 100 %.  ECOG PERFORMANCE STATUS: 2 - Symptomatic, <50% confined to bed  Physical Exam  Constitutional: Oriented to person, place, and time and cachetic appearing male. No distress.  HENT:  Head: Normocephalic and atraumatic.  Mouth/Throat: Oropharynx is clear and moist. No oropharyngeal exudate.  Eyes: Conjunctivae are normal. Right eye exhibits no discharge. Left eye exhibits no discharge. No scleral icterus.  Neck: Normal range of motion. Neck supple.  Cardiovascular: Normal rate, regular rhythm, normal heart sounds and intact distal pulses.   Pulmonary/Chest: Effort normal and breath sounds normal. No respiratory distress. No wheezes. No rales.  Abdominal: Soft. Bowel sounds are normal. Exhibits no distension and no mass. There is no tenderness.  Musculoskeletal: Normal range of motion. Exhibits no edema.  Lymphadenopathy:    No cervical adenopathy.  Neurological: Alert and oriented to person, place, and time. Exhibits normal muscle tone. Gait normal. Coordination normal.  Skin:  Skin is warm and dry. No rash noted. Not diaphoretic. No erythema. No pallor.  Psychiatric: Mood, memory and judgment normal.  Vitals reviewed.  LABORATORY DATA: Lab Results  Component Value Date   WBC 5.3 02/18/2019   HGB 13.8 02/18/2019   HCT 42.5 02/18/2019   MCV 97.5 02/18/2019   PLT 216 02/18/2019      Chemistry      Component Value Date/Time   NA 136 02/18/2019 1146   NA 140 01/23/2017 0910   K 3.8 02/18/2019 1146   K 4.5 01/23/2017 0910   CL 98 02/18/2019 1146   CO2 30 02/18/2019 1146   CO2 28 01/23/2017 0910   BUN 7 (L) 02/18/2019 1146   BUN 8.1 01/23/2017 0910   CREATININE 0.63 02/18/2019 1146   CREATININE 0.8 01/23/2017 0910   GLU 398 (H) 08/08/2016 1257      Component Value Date/Time   CALCIUM 8.0 (L) 02/18/2019 1146   CALCIUM 9.6 01/23/2017 0910   ALKPHOS  118 02/18/2019 1146   ALKPHOS 113 01/23/2017 0910   AST 17 02/18/2019 1146   AST 11 01/23/2017 0910   ALT 11 02/18/2019 1146   ALT 12 01/23/2017 0910   BILITOT 0.4 02/18/2019 1146   BILITOT 0.40 01/23/2017 0910       RADIOGRAPHIC STUDIES:  NM PET Image Restag (PS) Skull Base To Thigh  Result Date: 02/02/2019 CLINICAL DATA:  Subsequent treatment strategy for non-small cell lung cancer. EXAM: NUCLEAR MEDICINE PET SKULL BASE TO THIGH TECHNIQUE: 4.97 mCi F-18 FDG was injected intravenously. Full-ring PET imaging was performed from the skull base to thigh after the radiotracer. CT data was obtained and used for attenuation correction and anatomic localization. Fasting blood glucose: 127 mg/dl COMPARISON:  CT chest, abdomen and pelvis 01/18/2019 and multiple prior studies, including PET evaluation from 10/03/2015. FINDINGS: Mediastinal blood pool activity: SUV max 1.72 Liver activity: SUV max 1.99 NECK: Ovoid low-density lymph node in the left low neck, level IV (image 50, series 4) 1.3 cm (SUVmax = 10.90) Slightly enlarged compared to the chest CT of 01/18/2027 but not present on the exam, PET exam of  10/03/2015. Incidental CT findings: Signs of bilateral maxillary sinus disease worse than on the prior study with near complete opacification of the left maxillary sinus. Evidence of right carotid stenting. CHEST: Necrotic appearing lymph node at the thoracic inlet measuring 1.7 cm in short axis within 2 mm of most recent comparison (SUVmax = 5.33) Retrotracheal lymph node with necrosis unchanged from previous CT, not present on more remote PET examination measuring 7 mm (image 68, series 4) (SUVmax = 5.84) Similarly hypermetabolic small, subcentimeter lymph node along the right paratracheal chain measuring 7-8 mm (image 66, series 4) no signs of hilar or axillary adenopathy with hypermetabolic features. Basilar nodularity on the right just above the right hemidiaphragm new compared to the PET exam of 10/03/2015 (image 97, series 4) 9 mm and (image 100, series 4) 6 mm, background FDG uptake less than blood pool or liver. Smaller nodules are seen in this location, clusters of nodules. Incidental CT findings: Left subclavian Port-A-Cath terminates in the distal superior vena cava. Heart size is normal without pericardial effusion and extensive coronary artery calcification. Scattered aortic calcification. Central pulmonary vasculature is of normal caliber. Vessels not well assessed given lack of intravenous contrast. Esophagus grossly normal by CT. Signs of prior partial lung resection in the right chest related to right upper lobectomy. ABDOMEN/PELVIS: Bulky retroperitoneal adenopathy with signs of necrosis in the upper abdomen and retrocrural. Left para-aortic nodal group measures 3.6 similar to prior study (SUVmax = 6.49) not present on the previous imaging study. Intra-aortocaval nodal group measures 2.6 cm in short axis, also similar to previous exam. X 3.2 cm. (SUVmax = 6.11) Nodal group in the root of the small bowel mesentery (image 125, series 4) 2 cm short axis, not changed in terms of size. (SUVmax = 7.57  No signs of solid lesion with uptake to suggest metastatic disease to abdominal viscera. No signs of pelvic adenopathy.) Incidental CT findings: Marked hepatic steatosis. Sludge layers dependently in the gallbladder. Spleen is normal size. Pancreas is atrophic. Adrenal glands are normal. Noncontrast appearance of kidneys is normal. Signs of prior bowel resection with mesenteric nodal disease as discussed. No acute bowel process. Small volume free fluid in the pelvis is nonspecific and similar to the prior exam. SKELETON: No focal hypermetabolic activity to suggest skeletal metastasis. Incidental CT findings: Spinal degenerative changes. IMPRESSION: 1. Bulky adenopathy in the neck,  chest and abdomen with similar appearance when compared to the study of December of 2020 showing hypermetabolic features but with areas of necrosis. Left supraclavicular lymph node immediately deep to skin surface in the left low neck at level IV is intensely hypermetabolic. 2. Less pronounced uptake and small pulmonary lesions with some grouped nodularity, potentially related to infectious or inflammatory process. Attention on follow-up is suggested. 3. Marked hepatic steatosis. Electronically Signed   By: Zetta Bills M.D.   On: 02/02/2019 13:34   CT Biopsy  Result Date: 02/10/2019 INDICATION: 59 year old male with a history of non-small cell carcinoma with new retroperitoneal lymphadenopathy, referred for biopsy EXAM: CT BIOPSY MEDICATIONS: None. ANESTHESIA/SEDATION: Moderate (conscious) sedation was employed during this procedure. A total of Versed 1.0 mg and Fentanyl 25 mcg was administered intravenously. Moderate Sedation Time: 17 minutes. The patient's level of consciousness and vital signs were monitored continuously by radiology nursing throughout the procedure under my direct supervision. FLUOROSCOPY TIME:  CT COMPLICATIONS: None PROCEDURE: Informed written consent was obtained from the patient after a thorough discussion  of the procedural risks, benefits and alternatives. All questions were addressed. Maximal Sterile Barrier Technique was utilized including caps, mask, sterile gowns, sterile gloves, sterile drape, hand hygiene and skin antiseptic. A timeout was performed prior to the initiation of the procedure. Patient position prone position on CT gantry table. Scout CT was acquired for planning purposes. The patient is prepped and draped in the usual sterile fashion. The skin and subcutaneous tissues were generously infiltrated 1% lidocaine for local anesthesia. Using CT guidance, trocar guide needle was advanced into the region of the left retroperitoneal lymph nodes which demonstrated PET avid activity on the prior PET-CT. Once we confirmed needle tip position multiple (at least 10) 18 gauge core biopsy were acquired. Patient tolerated the procedure well and remained hemodynamically stable throughout. No complications were encountered and no significant blood loss. IMPRESSION: Status post CT-guided biopsy of left retroperitoneal lymph nodes, targeting the region of PET avid activity at the medial margin. Signed, Dulcy Fanny. Dellia Nims, RPVI [facility] Radiology Electronically Signed   By: Corrie Mckusick D.O.   On: 02/10/2019 12:54     ASSESSMENT/PLAN:  This is a very pleasant 59 year old Caucasian male with metastatic non-small cell lung cancer, poorly differentiated adenocarcinoma.  He presented with a right upper lobe lung mass.  He was initially diagnosed in September 2017.   He is status post right upper lobectomy with lymph node dissection.  Unfortunately he was found to have metastatic disease in the jejunum and terminal ileum.  He underwent surgical resection of the proximal and distal jejunum as well as proximal ileum.  The final pathology was consistent with high-grade carcinoma.  The patient underwent systemic chemotherapy with carboplatin and Alimta for 2 cycles.   This is discontinued secondary to disease progression.  He is status post 40 cycles on second line treatment with Keytruda 200 mg IV every 3 weeks.  He then underwent 2 cycles of docetaxel 75 mg/m and Cyramza 10 mg/kg IV every 3 weeks with Neulasta support.  This was discontinued secondary to disease progression.  He recently had a CT-guided biopsy of the left retroperitoneal lymph node which was most consistent with his prior diagnosis of poorly differentiated carcinoma.   At the patient's last appointment, the patient was given the option of systemic chemotherapy with gemcitabine 1000 mg/m on days 1 and 8 IV every 3 weeks versus a referral to hospice.  The patient opted  to pursue treatment with gemcitabine.  He is here for his first cycle today.   The patient was seen with Dr. Julien Nordmann today.  Labs were reviewed..  Dr. Julien Nordmann recommends that he proceed with the first cycle today as scheduled.   He will continue using oxycodone for his back pain.   The patient is cachetic. I will send a marinol to his pharmacy for his appetite. He is not a good candidate for steroids due to his diabetes.   The patient was advised to call immediately if he has any concerning symptoms in the interval. The patient voices understanding of current disease status and treatment options and is in agreement with the current care plan. All questions were answered. The patient knows to call the clinic with any problems, questions or concerns. We can certainly see the patient much sooner if necessary  No orders of the defined types were placed in this encounter.    Shawan Tosh L Maryela Tapper, PA-C 02/18/19  ADDENDUM: Hematology/Oncology Attending: I had a face-to-face encounter with the patient today.  I recommended his care plan.  This is a very pleasant 59 years old white male with metastatic non-small cell lung cancer, adenocarcinoma.  He is status post several chemotherapy regimens including carboplatin and Alimta  as well as immunotherapy with Keytruda discontinued recently for disease progression.  The patient was also treated with second line chemotherapy with docetaxel and Cyramza discontinued secondary to intolerance as well as disease progression. His last visit we discussed with him the option of palliative care and hospice referral versus consideration of palliative systemic chemotherapy with single agent gemcitabine.  The patient requested some time to think about his option and he came today for evaluation and consideration of treatment with gemcitabine.  His wife has several questions about treatment with immunotherapy with ipilimumab and nivolumab or doxorubicin.  I explained to the patient and his wife that he already received treatment with immunotherapy with [facility] and another treatment with ipilimumab and nivolumab would likely be ineffective especially with his previous disease progression on immunotherapy. I also explained to them that doxorubicin is not indicated in the treatment for patient with non-small cell lung cancer, adenocarcinoma at this point. The patient would like to proceed with gemcitabine as planned today. He will come back for follow-up visit in 3 weeks for evaluation before the next cycle of his treatment. He was also advised to quit smoking and take his medication for the diabetes mellitus as prescribed by his primary care physician. He was advised to call immediately if he has any concerning symptoms in the interval.  Disclaimer: This note was dictated with voice recognition software. Similar sounding words can inadvertently be transcribed and may be missed upon review. Eilleen Kempf, MD 02/18/19

## 2019-02-17 NOTE — Telephone Encounter (Signed)
I talk with patient regarding schedule  

## 2019-02-18 ENCOUNTER — Inpatient Hospital Stay: Payer: Medicare HMO

## 2019-02-18 ENCOUNTER — Inpatient Hospital Stay (HOSPITAL_BASED_OUTPATIENT_CLINIC_OR_DEPARTMENT_OTHER): Payer: Medicare HMO | Admitting: Physician Assistant

## 2019-02-18 ENCOUNTER — Other Ambulatory Visit: Payer: Self-pay

## 2019-02-18 ENCOUNTER — Encounter: Payer: Self-pay | Admitting: Physician Assistant

## 2019-02-18 VITALS — HR 97

## 2019-02-18 VITALS — BP 104/76 | HR 112 | Temp 98.3°F | Resp 18 | Ht 67.0 in | Wt 92.5 lb

## 2019-02-18 DIAGNOSIS — Z720 Tobacco use: Secondary | ICD-10-CM

## 2019-02-18 DIAGNOSIS — C3491 Malignant neoplasm of unspecified part of right bronchus or lung: Secondary | ICD-10-CM

## 2019-02-18 DIAGNOSIS — E119 Type 2 diabetes mellitus without complications: Secondary | ICD-10-CM | POA: Diagnosis not present

## 2019-02-18 DIAGNOSIS — Z5112 Encounter for antineoplastic immunotherapy: Secondary | ICD-10-CM

## 2019-02-18 DIAGNOSIS — M545 Low back pain: Secondary | ICD-10-CM | POA: Diagnosis not present

## 2019-02-18 DIAGNOSIS — R634 Abnormal weight loss: Secondary | ICD-10-CM

## 2019-02-18 DIAGNOSIS — G894 Chronic pain syndrome: Secondary | ICD-10-CM

## 2019-02-18 DIAGNOSIS — C784 Secondary malignant neoplasm of small intestine: Secondary | ICD-10-CM | POA: Diagnosis not present

## 2019-02-18 DIAGNOSIS — C3411 Malignant neoplasm of upper lobe, right bronchus or lung: Secondary | ICD-10-CM | POA: Diagnosis not present

## 2019-02-18 DIAGNOSIS — R599 Enlarged lymph nodes, unspecified: Secondary | ICD-10-CM | POA: Diagnosis not present

## 2019-02-18 DIAGNOSIS — R110 Nausea: Secondary | ICD-10-CM | POA: Diagnosis not present

## 2019-02-18 DIAGNOSIS — Z79899 Other long term (current) drug therapy: Secondary | ICD-10-CM | POA: Diagnosis not present

## 2019-02-18 DIAGNOSIS — Z95828 Presence of other vascular implants and grafts: Secondary | ICD-10-CM

## 2019-02-18 DIAGNOSIS — Z5111 Encounter for antineoplastic chemotherapy: Secondary | ICD-10-CM | POA: Diagnosis not present

## 2019-02-18 DIAGNOSIS — Z452 Encounter for adjustment and management of vascular access device: Secondary | ICD-10-CM | POA: Diagnosis not present

## 2019-02-18 LAB — CBC WITH DIFFERENTIAL (CANCER CENTER ONLY)
Abs Immature Granulocytes: 0.03 10*3/uL (ref 0.00–0.07)
Basophils Absolute: 0 10*3/uL (ref 0.0–0.1)
Basophils Relative: 0 %
Eosinophils Absolute: 0.1 10*3/uL (ref 0.0–0.5)
Eosinophils Relative: 2 %
HCT: 42.5 % (ref 39.0–52.0)
Hemoglobin: 13.8 g/dL (ref 13.0–17.0)
Immature Granulocytes: 1 %
Lymphocytes Relative: 16 %
Lymphs Abs: 0.9 10*3/uL (ref 0.7–4.0)
MCH: 31.7 pg (ref 26.0–34.0)
MCHC: 32.5 g/dL (ref 30.0–36.0)
MCV: 97.5 fL (ref 80.0–100.0)
Monocytes Absolute: 0.4 10*3/uL (ref 0.1–1.0)
Monocytes Relative: 7 %
Neutro Abs: 3.9 10*3/uL (ref 1.7–7.7)
Neutrophils Relative %: 74 %
Platelet Count: 216 10*3/uL (ref 150–400)
RBC: 4.36 MIL/uL (ref 4.22–5.81)
RDW: 13.7 % (ref 11.5–15.5)
WBC Count: 5.3 10*3/uL (ref 4.0–10.5)
nRBC: 0 % (ref 0.0–0.2)

## 2019-02-18 LAB — CMP (CANCER CENTER ONLY)
ALT: 11 U/L (ref 0–44)
AST: 17 U/L (ref 15–41)
Albumin: 2.6 g/dL — ABNORMAL LOW (ref 3.5–5.0)
Alkaline Phosphatase: 118 U/L (ref 38–126)
Anion gap: 8 (ref 5–15)
BUN: 7 mg/dL — ABNORMAL LOW (ref 8–23)
CO2: 30 mmol/L (ref 22–32)
Calcium: 8 mg/dL — ABNORMAL LOW (ref 8.9–10.3)
Chloride: 98 mmol/L (ref 98–111)
Creatinine: 0.63 mg/dL (ref 0.61–1.24)
GFR, Est AFR Am: >60 mL/min (ref >60)
GFR, Estimated: >60 mL/min (ref >60)
Glucose, Bld: 173 mg/dL — ABNORMAL HIGH (ref 70–99)
Potassium: 3.8 mmol/L (ref 3.5–5.1)
Sodium: 136 mmol/L (ref 135–145)
Total Bilirubin: 0.4 mg/dL (ref 0.3–1.2)
Total Protein: 5.4 g/dL — ABNORMAL LOW (ref 6.5–8.1)

## 2019-02-18 LAB — TSH: TSH: 3.881 u[IU]/mL (ref 0.320–4.118)

## 2019-02-18 LAB — TOTAL PROTEIN, URINE DIPSTICK: Protein, ur: 30 mg/dL — AB

## 2019-02-18 MED ORDER — HEPARIN SOD (PORK) LOCK FLUSH 100 UNIT/ML IV SOLN
500.0000 [IU] | Freq: Once | INTRAVENOUS | Status: AC | PRN
  Administered 2019-02-18: 500 [IU]
  Filled 2019-02-18: qty 5

## 2019-02-18 MED ORDER — ALTEPLASE 2 MG IJ SOLR
2.0000 mg | Freq: Once | INTRAMUSCULAR | Status: AC | PRN
  Administered 2019-02-18: 2 mg
  Filled 2019-02-18: qty 2

## 2019-02-18 MED ORDER — SODIUM CHLORIDE 0.9 % IV SOLN
1000.0000 mg/m2 | Freq: Once | INTRAVENOUS | Status: AC
  Administered 2019-02-18: 1406 mg via INTRAVENOUS
  Filled 2019-02-18: qty 36.98

## 2019-02-18 MED ORDER — DRONABINOL 2.5 MG PO CAPS: 2.5000 mg | ORAL_CAPSULE | Freq: Two times a day (BID) | ORAL | 0 refills | Status: AC

## 2019-02-18 MED ORDER — SODIUM CHLORIDE 0.9% FLUSH
10.0000 mL | INTRAVENOUS | Status: DC | PRN
  Administered 2019-02-18: 10 mL
  Filled 2019-02-18: qty 10

## 2019-02-18 MED ORDER — PROCHLORPERAZINE MALEATE 10 MG PO TABS
10.0000 mg | ORAL_TABLET | Freq: Once | ORAL | Status: AC
  Administered 2019-02-18: 10 mg via ORAL

## 2019-02-18 MED ORDER — SODIUM CHLORIDE 0.9 % IV SOLN
Freq: Once | INTRAVENOUS | Status: AC
  Filled 2019-02-18: qty 250

## 2019-02-18 MED ORDER — ALTEPLASE 2 MG IJ SOLR
INTRAMUSCULAR | Status: AC
  Filled 2019-02-18: qty 2

## 2019-02-18 MED ORDER — PROCHLORPERAZINE MALEATE 10 MG PO TABS
ORAL_TABLET | ORAL | Status: AC
  Filled 2019-02-18: qty 1

## 2019-02-18 NOTE — Patient Instructions (Signed)
[facility] Discharge Instructions for Patients Receiving Chemotherapy  Today you received the following chemotherapy agents gemcitabine (Gemzar)  To help prevent nausea and vomiting after your treatment, we encourage you to take your nausea medication as prescribed.   If you develop nausea and vomiting that is not controlled by your nausea medication, call the clinic.   BELOW ARE SYMPTOMS THAT SHOULD BE REPORTED IMMEDIATELY:  *FEVER GREATER THAN 100.5 F  *CHILLS WITH OR WITHOUT FEVER  NAUSEA AND VOMITING THAT IS NOT CONTROLLED WITH YOUR NAUSEA MEDICATION  *UNUSUAL SHORTNESS OF BREATH  *UNUSUAL BRUISING OR BLEEDING  TENDERNESS IN MOUTH AND THROAT WITH OR WITHOUT PRESENCE OF ULCERS  *URINARY PROBLEMS  *BOWEL PROBLEMS  UNUSUAL RASH Items with * indicate a potential emergency and should be followed up as soon as possible.  Feel free to call the clinic should you have any questions or concerns. The clinic phone number is [phone].  Please show the [place] at check-in to the Emergency Department and triage nurse.  Gemcitabine injection What is this medicine? GEMCITABINE (jem SYE ta been) is a chemotherapy drug. This medicine is used to treat many types of cancer like breast cancer, lung cancer, pancreatic cancer, and ovarian cancer. This medicine may be used for other purposes; ask your health care provider or pharmacist if you have questions. COMMON BRAND NAME(S): Gemzar, Infugem What should I tell my health care provider before I take this medicine? They need to know if you have any of these conditions:  blood disorders  infection  kidney disease  liver disease  lung or breathing disease, like asthma  recent or ongoing radiation therapy  an unusual or allergic reaction to gemcitabine, other chemotherapy, other medicines, foods, dyes, or preservatives  pregnant or trying to get pregnant  breast-feeding How should I use this  medicine? This drug is given as an infusion into a vein. It is administered in a hospital or clinic by a specially trained health care professional. Talk to your pediatrician regarding the use of this medicine in children. Special care may be needed. Overdosage: If you think you have taken too much of this medicine contact a poison control center or emergency room at once. NOTE: This medicine is only for you. Do not share this medicine with others. What if I miss a dose? It is important not to miss your dose. Call your doctor or health care professional if you are unable to keep an appointment. What may interact with this medicine?  medicines to increase blood counts like filgrastim, pegfilgrastim, sargramostim  some other chemotherapy drugs like cisplatin  vaccines Talk to your doctor or health care professional before taking any of these medicines:  acetaminophen  aspirin  ibuprofen  ketoprofen  naproxen This list may not describe all possible interactions. Give your health care provider a list of all the medicines, herbs, non-prescription drugs, or dietary supplements you use. Also tell them if you smoke, drink alcohol, or use illegal drugs. Some items may interact with your medicine. What should I watch for while using this medicine? Visit your doctor for checks on your progress. This drug may make you feel generally unwell. This is not uncommon, as chemotherapy can affect healthy cells as well as cancer cells. Report any side effects. Continue your course of treatment even though you feel ill unless your doctor tells you to stop. In some cases, you may be given additional medicines to help with side effects. Follow all directions for their use.  Call your doctor or health care professional for advice if you get a fever, chills or sore throat, or other symptoms of a cold or flu. Do not treat yourself. This drug decreases your body's ability to fight infections. Try to avoid being  around people who are sick. This medicine may increase your risk to bruise or bleed. Call your doctor or health care professional if you notice any unusual bleeding. Be careful brushing and flossing your teeth or using a toothpick because you may get an infection or bleed more easily. If you have any dental work done, tell your dentist you are receiving this medicine. Avoid taking products that contain aspirin, acetaminophen, ibuprofen, naproxen, or ketoprofen unless instructed by your doctor. These medicines may hide a fever. Do not become pregnant while taking this medicine or for 6 months after stopping it. Women should inform their doctor if they wish to become pregnant or think they might be pregnant. Men should not father a child while taking this medicine and for 3 months after stopping it. There is a potential for serious side effects to an unborn child. Talk to your health care professional or pharmacist for more information. Do not breast-feed an infant while taking this medicine or for at least 1 week after stopping it. Men should inform their doctors if they wish to father a child. This medicine may lower sperm counts. Talk with your doctor or health care professional if you are concerned about your fertility. What side effects may I notice from receiving this medicine? Side effects that you should report to your doctor or health care professional as soon as possible:  allergic reactions like skin rash, itching or hives, swelling of the face, lips, or tongue  breathing problems  pain, redness, or irritation at site where injected  signs and symptoms of a dangerous change in heartbeat or heart rhythm like chest pain; dizziness; fast or irregular heartbeat; palpitations; feeling faint or lightheaded, falls; breathing problems  signs of decreased platelets or bleeding - bruising, pinpoint red spots on the skin, black, tarry stools, blood in the urine  signs of decreased red blood cells -  unusually weak or tired, feeling faint or lightheaded, falls  signs of infection - fever or chills, cough, sore throat, pain or difficulty passing urine  signs and symptoms of kidney injury like trouble passing urine or change in the amount of urine  signs and symptoms of liver injury like dark yellow or brown urine; general ill feeling or flu-like symptoms; light-colored stools; loss of appetite; nausea; right upper belly pain; unusually weak or tired; yellowing of the eyes or skin  swelling of ankles, feet, hands Side effects that usually do not require medical attention (report to your doctor or health care professional if they continue or are bothersome):  constipation  diarrhea  hair loss  loss of appetite  nausea  rash  vomiting This list may not describe all possible side effects. Call your doctor for medical advice about side effects. You may report side effects to FDA at 1-800-FDA-1088. Where should I keep my medicine? This drug is given in a hospital or clinic and will not be stored at home. NOTE: This sheet is a summary. It may not cover all possible information. If you have questions about this medicine, talk to your doctor, pharmacist, or health care provider.  2020 Elsevier/Gold Standard (2017-04-02 18:06:11)  Coronavirus (COVID-19) Are you at risk?  Are you at risk for the Coronavirus (COVID-19)?  To be considered  HIGH RISK for Coronavirus (COVID-19), you have to meet the following criteria:  . Traveled to [place], [place], [place], [place] or [place]; or in the [place] to [place], [place], [place], or [place]; and have fever, cough, and shortness of breath within the last 2 weeks of travel OR . Been in close contact with a person diagnosed with COVID-19 within the last 2 weeks and have fever, cough, and shortness of breath . IF YOU DO NOT MEET THESE CRITERIA, YOU ARE CONSIDERED LOW RISK FOR COVID-19.  What to do if you are HIGH RISK for  COVID-19?  Marland Kitchen If you are having a medical emergency, call 911. . Seek medical care right away. Before you go to a doctor's office, urgent care or emergency department, call ahead and tell them about your recent travel, contact with someone diagnosed with COVID-19, and your symptoms. You should receive instructions from your physician's office regarding next steps of care.  . When you arrive at healthcare provider, tell the healthcare staff immediately you have returned from visiting [place], [place], [place], [place] or [place]; or traveled in the [place] to [place], [place], [place], or [place]; in the last two weeks or you have been in close contact with a person diagnosed with COVID-19 in the last 2 weeks.   . Tell the health care staff about your symptoms: fever, cough and shortness of breath. . After you have been seen by a medical provider, you will be either: o Tested for (COVID-19) and discharged home on quarantine except to seek medical care if symptoms worsen, and asked to  - Stay home and avoid contact with others until you get your results (4-5 days)  - Avoid travel on public transportation if possible (such as bus, train, or airplane) or o Sent to the Emergency Department by EMS for evaluation, COVID-19 testing, and possible admission depending on your condition and test results.  What to do if you are LOW RISK for COVID-19?  Reduce your risk of any infection by using the same precautions used for avoiding the common cold or flu:  Marland Kitchen Wash your hands often with soap and warm water for at least 20 seconds.  If soap and water are not readily available, use an alcohol-based hand sanitizer with at least 60% alcohol.  . If coughing or sneezing, cover your mouth and nose by coughing or sneezing into the elbow areas of your shirt or coat, into a tissue or into your sleeve (not your hands). . Avoid shaking hands with others and consider head nods or verbal greetings only. . Avoid  touching your eyes, nose, or mouth with unwashed hands.  . Avoid close contact with people who are sick. . Avoid places or events with large numbers of people in one location, like concerts or sporting events. . Carefully consider travel plans you have or are making. . If you are planning any travel outside or inside the [place], visit the CDC's Travelers' Health webpage for the latest health notices. . If you have some symptoms but not all symptoms, continue to monitor at home and seek medical attention if your symptoms worsen. . If you are having a medical emergency, call 911.   [place] / e-Visit: [email or web address]         [facility]: [facility]: [phone]  [facility]: [phone]

## 2019-02-19 ENCOUNTER — Telehealth: Payer: Self-pay | Admitting: Emergency Medicine

## 2019-02-19 NOTE — Telephone Encounter (Signed)
Chemo f/u call, no answer.  Left VM with call back info and advised pt to call with any questions or concerns or issues following recent infusion.

## 2019-02-19 NOTE — Telephone Encounter (Signed)
-----   Message from Rolene Course, RN sent at 02/18/2019  3:32 PM EST ----- Regarding: Brett [name] 1st Tx F/U call - Gemzar Mohamed 1st Tx F/U call - Gemzar

## 2019-02-20 ENCOUNTER — Telehealth: Payer: Self-pay | Admitting: Internal Medicine

## 2019-02-20 NOTE — Telephone Encounter (Signed)
   Reason for call:   I received a call from [name] [name] at 2 AM indicating complaints of leg swelling.   Pertinent Data:   Brett [name] received gemcitabine as part of his chemotherapy regimen for his non-small cell lung cancer on Thursday and noted that he was beginning to endorse leg swelling. He mentions that using compression stockings has improved his symptoms and he states he was told to contact his provider if this occurs so he gave the on-call number a call. Mentions that he had swelling in both his hands and bilateral legs. Denies any chest pain, palpitations, dyspnea, orthopnea, nausea, vomiting, fevers or chills.   Assessment / Plan / Recommendations:   Peripheral edema: Likely due to gemcitabine as he received his first dose on 02/18/19 and peripheral edema is a known side effect. Advised to continue compression stockings and contact the cancer center on Monday. Discussed red-flag symptoms such as chest pain, shortness of breath and cautioned to go to ED if necessary.  As always, pt is advised that if symptoms worsen or new symptoms arise, they should go to an urgent care facility or to to ER for further evaluation.   Mosetta Anis, MD   02/20/2019, 3:40 AM

## 2019-02-23 ENCOUNTER — Telehealth: Payer: Self-pay | Admitting: Internal Medicine

## 2019-02-23 NOTE — Telephone Encounter (Signed)
Scheduled per los. Called and left msg about added appts. Mailed printout

## 2019-02-25 ENCOUNTER — Inpatient Hospital Stay: Payer: Medicare HMO | Attending: Internal Medicine

## 2019-02-25 ENCOUNTER — Other Ambulatory Visit: Payer: Self-pay

## 2019-02-25 ENCOUNTER — Inpatient Hospital Stay: Payer: Medicare HMO

## 2019-02-25 VITALS — BP 124/89 | HR 98 | Temp 98.5°F | Resp 18 | Wt 98.5 lb

## 2019-02-25 DIAGNOSIS — C3411 Malignant neoplasm of upper lobe, right bronchus or lung: Secondary | ICD-10-CM | POA: Diagnosis not present

## 2019-02-25 DIAGNOSIS — Z452 Encounter for adjustment and management of vascular access device: Secondary | ICD-10-CM | POA: Diagnosis not present

## 2019-02-25 DIAGNOSIS — R531 Weakness: Secondary | ICD-10-CM | POA: Insufficient documentation

## 2019-02-25 DIAGNOSIS — Z5111 Encounter for antineoplastic chemotherapy: Secondary | ICD-10-CM | POA: Diagnosis not present

## 2019-02-25 DIAGNOSIS — C784 Secondary malignant neoplasm of small intestine: Secondary | ICD-10-CM | POA: Diagnosis not present

## 2019-02-25 DIAGNOSIS — I959 Hypotension, unspecified: Secondary | ICD-10-CM | POA: Diagnosis not present

## 2019-02-25 DIAGNOSIS — E162 Hypoglycemia, unspecified: Secondary | ICD-10-CM | POA: Diagnosis not present

## 2019-02-25 DIAGNOSIS — R5383 Other fatigue: Secondary | ICD-10-CM | POA: Diagnosis not present

## 2019-02-25 DIAGNOSIS — C3491 Malignant neoplasm of unspecified part of right bronchus or lung: Secondary | ICD-10-CM

## 2019-02-25 DIAGNOSIS — Z95828 Presence of other vascular implants and grafts: Secondary | ICD-10-CM

## 2019-02-25 LAB — CBC WITH DIFFERENTIAL (CANCER CENTER ONLY)
Abs Immature Granulocytes: 0.01 10*3/uL (ref 0.00–0.07)
Basophils Absolute: 0 10*3/uL (ref 0.0–0.1)
Basophils Relative: 0 %
Eosinophils Absolute: 0 10*3/uL (ref 0.0–0.5)
Eosinophils Relative: 1 %
HCT: 38.5 % — ABNORMAL LOW (ref 39.0–52.0)
Hemoglobin: 12.8 g/dL — ABNORMAL LOW (ref 13.0–17.0)
Immature Granulocytes: 0 %
Lymphocytes Relative: 21 %
Lymphs Abs: 0.8 10*3/uL (ref 0.7–4.0)
MCH: 32.2 pg (ref 26.0–34.0)
MCHC: 33.2 g/dL (ref 30.0–36.0)
MCV: 96.7 fL (ref 80.0–100.0)
Monocytes Absolute: 0.3 10*3/uL (ref 0.1–1.0)
Monocytes Relative: 7 %
Neutro Abs: 2.8 10*3/uL (ref 1.7–7.7)
Neutrophils Relative %: 71 %
Platelet Count: 143 10*3/uL — ABNORMAL LOW (ref 150–400)
RBC: 3.98 MIL/uL — ABNORMAL LOW (ref 4.22–5.81)
RDW: 13.1 % (ref 11.5–15.5)
WBC Count: 3.9 10*3/uL — ABNORMAL LOW (ref 4.0–10.5)
nRBC: 0 % (ref 0.0–0.2)

## 2019-02-25 LAB — CMP (CANCER CENTER ONLY)
ALT: 19 U/L (ref 0–44)
AST: 22 U/L (ref 15–41)
Albumin: 2.5 g/dL — ABNORMAL LOW (ref 3.5–5.0)
Alkaline Phosphatase: 108 U/L (ref 38–126)
Anion gap: 6 (ref 5–15)
BUN: 7 mg/dL — ABNORMAL LOW (ref 8–23)
CO2: 30 mmol/L (ref 22–32)
Calcium: 8.3 mg/dL — ABNORMAL LOW (ref 8.9–10.3)
Chloride: 98 mmol/L (ref 98–111)
Creatinine: 0.48 mg/dL — ABNORMAL LOW (ref 0.61–1.24)
GFR, Est AFR Am: >60 mL/min (ref >60)
GFR, Estimated: >60 mL/min (ref >60)
Glucose, Bld: 178 mg/dL — ABNORMAL HIGH (ref 70–99)
Potassium: 4.1 mmol/L (ref 3.5–5.1)
Sodium: 134 mmol/L — ABNORMAL LOW (ref 135–145)
Total Bilirubin: 0.6 mg/dL (ref 0.3–1.2)
Total Protein: 5.6 g/dL — ABNORMAL LOW (ref 6.5–8.1)

## 2019-02-25 MED ORDER — ALTEPLASE 2 MG IJ SOLR
INTRAMUSCULAR | Status: AC
  Filled 2019-02-25: qty 2

## 2019-02-25 MED ORDER — SODIUM CHLORIDE 0.9 % IV SOLN
1400.0000 mg | Freq: Once | INTRAVENOUS | Status: AC
  Administered 2019-02-25: 1400 mg via INTRAVENOUS
  Filled 2019-02-25: qty 36.82

## 2019-02-25 MED ORDER — PROCHLORPERAZINE MALEATE 10 MG PO TABS
10.0000 mg | ORAL_TABLET | Freq: Once | ORAL | Status: AC
  Administered 2019-02-25: 10:00:00 10 mg via ORAL

## 2019-02-25 MED ORDER — HEPARIN SOD (PORK) LOCK FLUSH 100 UNIT/ML IV SOLN
500.0000 [IU] | Freq: Once | INTRAVENOUS | Status: AC | PRN
  Administered 2019-02-25: 500 [IU]
  Filled 2019-02-25: qty 5

## 2019-02-25 MED ORDER — SODIUM CHLORIDE 0.9% FLUSH
10.0000 mL | INTRAVENOUS | Status: DC | PRN
  Administered 2019-02-25: 10 mL
  Filled 2019-02-25: qty 10

## 2019-02-25 MED ORDER — SODIUM CHLORIDE 0.9 % IV SOLN
Freq: Once | INTRAVENOUS | Status: AC
  Filled 2019-02-25: qty 250

## 2019-02-25 MED ORDER — ALTEPLASE 2 MG IJ SOLR
2.0000 mg | Freq: Once | INTRAMUSCULAR | Status: AC | PRN
  Administered 2019-02-25: 2 mg
  Filled 2019-02-25: qty 2

## 2019-02-25 MED ORDER — PROCHLORPERAZINE MALEATE 10 MG PO TABS
ORAL_TABLET | ORAL | Status: AC
  Filled 2019-02-25: qty 1

## 2019-02-25 NOTE — Patient Instructions (Signed)
[facility] Discharge Instructions for Patients Receiving Chemotherapy  Today you received the following chemotherapy agents: gemcitabine.  To help prevent nausea and vomiting after your treatment, we encourage you to take your nausea medication as directed.   If you develop nausea and vomiting that is not controlled by your nausea medication, call the clinic.   BELOW ARE SYMPTOMS THAT SHOULD BE REPORTED IMMEDIATELY:  *FEVER GREATER THAN 100.5 F  *CHILLS WITH OR WITHOUT FEVER  NAUSEA AND VOMITING THAT IS NOT CONTROLLED WITH YOUR NAUSEA MEDICATION  *UNUSUAL SHORTNESS OF BREATH  *UNUSUAL BRUISING OR BLEEDING  TENDERNESS IN MOUTH AND THROAT WITH OR WITHOUT PRESENCE OF ULCERS  *URINARY PROBLEMS  *BOWEL PROBLEMS  UNUSUAL RASH Items with * indicate a potential emergency and should be followed up as soon as possible.  Feel free to call the clinic should you have any questions or concerns. The clinic phone number is [phone].  Please show the CHEMO ALERT CARD at check-in to the Emergency Department and triage nurse.   

## 2019-03-10 ENCOUNTER — Telehealth: Payer: Self-pay | Admitting: Medical Oncology

## 2019-03-10 ENCOUNTER — Telehealth: Payer: Self-pay | Admitting: Internal Medicine

## 2019-03-10 NOTE — Telephone Encounter (Signed)
Rescheduled 2/18 appt for a later time due to inclement weather. Called and spoke with patients wife. They are ok to come in at the new time scheduled and will call if they can not make it. Gave my direct number

## 2019-03-10 NOTE — Telephone Encounter (Signed)
LVM for [place] to come in tomorrow at 1000.

## 2019-03-11 ENCOUNTER — Inpatient Hospital Stay: Payer: Medicare HMO | Admitting: Internal Medicine

## 2019-03-11 ENCOUNTER — Telehealth: Payer: Self-pay

## 2019-03-11 ENCOUNTER — Inpatient Hospital Stay: Payer: Medicare HMO

## 2019-03-11 NOTE — Progress Notes (Signed)
[facility] OFFICE PROGRESS NOTE  Brett Falcon, MD [place] [address]  DIAGNOSIS: Stage IV (T1b, N0, M1b) non-small cell lung cancer, adenocarcinoma presented with right upper lobe lung nodule and recent metastasis to the small intestine. This was initially diagnosed in September 2017  Genomic Alterations Identified? ERBB2 amplification -equivocal? CDKN2A p16INK4a E88* and p14ARF Z858I SMARCA4 splice site [id number]>MV SPTA1 E2022* TOP2A amplification TP53 A159P Additional Findings? Microsatellite status MS-Stable Tumor Mutation Burden TMB-Intermediate; 18 Muts/Mb Additional Disease-relevant Genes with No Reportable Alterations Identified? EGFR KRAS ALK BRAF MET RET ROS1  PRIOR THERAPY: 1) Status post right VATS with right upper lobectomy and mediastinal lymph node dissection under the care of Dr. Roxan Hockey on 10/18/2015 and the final pathology was consistent with stage IA (T1b, N0, MX). 2) upper endoscopy on 01/05/2016 showed normal esophagus, normal stomach but there was occasional mass around 3.0 CM in length circumferential nonobstructing in the jejunum. The final pathology was consistent with metastatic adenocarcinoma. 3) status post laparoscopic laparotomy and resection of proximal lesion and and distal jejunum/proximal ileum under the care of Dr. Hassell Done 1 02/27/2016. 3) Systemic chemotherapy with carboplatin for AUC of 5 and Alimta 500 MG/M2 every 3 weeks. First dose 04/04/2016. Status post 2 cycles. Last dose was given 04/21/2016 discontinued secondary to disease progression. 4)Second line immunotherapy with Ketruda 200 mg IV every 2 weeks, first dose 05/30/2016. Status post40cycles.Last dose October 22, 2018. Discontinued today secondary to disease progression. 5) Systemic chemotherapy with docetaxel 75 mg/M2 and Cyramza 10 mg/KG every 3 weeks. First dose November 19, 2018.Status post2cycle. Dose of Docetaxel reduced to 60  mg/m2 starting on cycle #2.Last dose on 12/31/2018. Discontinued due to evidence of disease progression.   CURRENT THERAPY: Systemic chemotherapy with gemcitabine 1,000 mg/m2 on days 1 and 8 every 3 weeks.  First dose on 02/18/2019.Status post 1 cycle.   INTERVAL HISTORY: Brett [name] 59 y.o. male returns to the clinic today for a follow-up visit.  The patient has been experiencing significant bilateral lower extremity swelling from his feet up to his abdomen. He also has intermittent swelling of his hands bilaterally. The patient has gained 18 lbs over the last 3 weeks secondary to the swelling.  The bilateral lower extremity swelling is making it very challenging to ambulate.  The patient states he tries to elevate his legs and uses compression stockings.  Patient denies any excessive salt intake. He has poor nutritional intake. He was prescribed marinol at his last visit but he states he has not been taking it because he states his appetite has reportedly improved and he did not feel as though he needed it.   Otherwise the patient tolerated his treatment fairly well without any concerning adverse side effects except for mild nausea and vomiting.  He denies any recent fevers or infections.  He denies any chills or night sweats.  He denies any chest pain, shortness of breath, cough, or hemoptysis.  He still is endorsing back pain for which he states he has been taking oxycodone more frequently than prescribed.  He is unable to quantify how many oxycodones he takes per day. He ran out of his MS contin 15 mg BID. He denies any diarrhea or constipation.  His last bowel movement was yesterday.   He is here today for evaluation before starting cycle #2 of his treatment.  MEDICAL HISTORY: Past Medical History:  Diagnosis Date  . Barrett's esophagus 07/01/2013   Without dysplasia on biopsy 09/03/2012. Repeat EGD  recommended 08/2015  . Bilateral cataracts 02/13/2017  . Carotid artery stenosis 07/01/2013    Requiring right sided stent   . Chronic pain syndrome 07/01/2013  . Closed head injury with brief loss of consciousness ([place]) 07/22/2010   Head trapped in a hydraulic device at work.  Fracture of orbital bones on right and brief loss of consciousness per report.  . 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 There was likely a strong psycho-social overlay in regard to the cognitive deficits in the form of mood disorder with psychotic features and mixed anxiety symptomatology. His primary tested cognitive deficits are in the areas of attention, executi  . Daily headache "since 07/2010"   constantly  . Degenerative joint disease of cervical spine 07/01/2013  . Dupuytren's contracture of both hands 04/08/2014  . Encounter for antineoplastic chemotherapy 10/05/2015  . Encounter for antineoplastic immunotherapy 05/24/2016  . Erectile dysfunction associated with type 2 diabetes mellitus ([place]) 07/01/2013  . Fibromyalgia 07/01/2013  . Goals of care, counseling/discussion 03/28/2016  . History of blood transfusion 11/28/2015   "suppose to get his first today" (11/28/2015)  . Hyperlipidemia LDL goal < 100 07/01/2013  . Intractable hiccups 04/11/2016  . 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 Memory changes    "memory issues" from head injury  . Moderate protein-calorie malnutrition ([place]) 11/29/2015  . Osteoarthritis of right thumb 10/21/2014  . Peripheral vascular occlusive disease ([place]) 07/01/2013   Requiring 2 arterial stents above the left knee per report  . Pneumonia ~ 2006/2007  . Post traumatic stress disorder 07/01/2013  . Primary lung adenocarcinoma ([place]) dx'd 08/2015   "right lung"  . Severe major depression with psychotic features ([place]) 04/15/2011  . Tobacco abuse 07/01/2013  . Type 2 diabetes mellitus with vascular disease ([place]) 07/01/2013   Left lower extremity  and right carotid stenting    ALLERGIES:  is allergic to gabapentin; lyrica [pregabalin]; jardiance [empagliflozin]; celebrex [celecoxib]; and contrast media [iodinated diagnostic agents].  MEDICATIONS:  Current Outpatient Medications  Medication Sig Dispense Refill  . ACCU-CHEK SOFTCLIX LANCETS lancets Use to test blood glucose 1-2 times daily. Dx Code E11.59 100 each 12  . aspirin EC 81 MG tablet Take 81 mg by mouth daily.     Marland Kitchen atorvastatin (LIPITOR) 40 MG tablet TAKE 1 TABLET(40 MG) BY MOUTH DAILY 90 tablet 3  . Blood Glucose Monitoring Suppl (ACCU-CHEK AVIVA PLUS) w/Device KIT Use to test blood glucose 1-2 times daily. Dx Code E11.59 1 kit 0  . dexamethasone (DECADRON) 4 MG tablet 4 mg p.o. twice daily the day before, day of and day after chemotherapy every 3 weeks. (Patient taking differently: Take 4 mg by mouth See admin instructions. Take 1 tablet. twice daily the day before, day of and day after chemotherapy every 3 weeks.) 40 tablet 1  . dronabinol (MARINOL) 2.5 MG capsule Take 1 capsule (2.5 mg total) by mouth 2 (two) times daily before a meal. 60 capsule 0  . furosemide (LASIX) 20 MG tablet Please take 1 tablet twice daily for 1 week, then take 1 tablet once a day as needed for swelling 30 tablet 0  . glipiZIDE (GLUCOTROL) 10 MG tablet Take 1 tablet (10 mg total) by mouth 2 (two) times daily before a meal. 180 tablet 3  . glucose blood test strip USE TO TEST BLOOD GLUCOSE 1 TO 2 TIMES DAILY 100 each 4  . insulin  aspart (NOVOLOG) 100 UNIT/ML FlexPen Inject 1-9 units into the skin with your 2 biggest meals  based on the sliding scale provided to you. 5 pen 3  . Insulin Glargine (LANTUS) 100 UNIT/ML Solostar Pen Inject 5 Units into the skin daily. 15 mL 3  . Insulin Pen Needle (PEN NEEDLES) 32G X 4 MM MISC 1 pen by Does not apply route 2 (two) times daily. Dx Code E11.59 200 each 6  . levothyroxine (SYNTHROID) 50 MCG tablet Take 1 tablet (50 mcg total) by mouth daily before breakfast. 30  tablet 2  . morphine (MS CONTIN) 15 MG 12 hr tablet Take 1 tablet (15 mg total) by mouth every 12 (twelve) hours. 60 tablet 0  . oxyCODONE-acetaminophen (PERCOCET) 10-325 MG tablet Take 1 tablet by mouth every 6 (six) hours as needed for pain. 30 tablet 0  . oxyCODONE-acetaminophen (PERCOCET) 10-325 MG tablet Take 1-2 tablets by mouth every 6 (six) hours as needed for pain. 240 tablet 0  . pantoprazole (PROTONIX) 40 MG tablet TAKE 1 TABLET(40 MG) BY MOUTH DAILY (Patient taking differently: Take 40 mg by mouth daily. ) 90 tablet 1  . potassium chloride SA (KLOR-CON) 20 MEQ tablet Take 1 tablet (20 mEq total) by mouth daily. 30 tablet 0  . predniSONE (DELTASONE) 50 MG tablet Take one tablet 13 hours , one tablet 7 hours and one tablet 1 hour prior to CT scan. 3 tablet 3  . prochlorperazine (COMPAZINE) 10 MG tablet Take 1 tablet (10 mg total) by mouth every 6 (six) hours as needed for nausea or vomiting. 30 tablet 5  . sitaGLIPtin (JANUVIA) 100 MG tablet Take 1 tablet (100 mg total) by mouth daily. 90 tablet 3   No current facility-administered medications for this visit.    SURGICAL HISTORY:  Past Surgical History:  Procedure Laterality Date  . CAROTID STENT Right ?2014  . COLONOSCOPY N/A 11/30/2015   Procedure: COLONOSCOPY;  Surgeon: Teena Irani, MD;  Location: [facility] ENDOSCOPY;  Service: Endoscopy;  Laterality: N/A;  . ESOPHAGOGASTRODUODENOSCOPY N/A 11/30/2015   Procedure: ESOPHAGOGASTRODUODENOSCOPY (EGD);  Surgeon: Teena Irani, MD;  Location: [facility] ENDOSCOPY;  Service: Endoscopy;  Laterality: N/A;  . ESOPHAGOGASTRODUODENOSCOPY (EGD) WITH PROPOFOL N/A 01/05/2016   Procedure: ESOPHAGOGASTRODUODENOSCOPY (EGD) WITH PROPOFOL;  Surgeon: Teena Irani, MD;  Location: [facility] ENDOSCOPY;  Service: Endoscopy;  Laterality: N/A;  . FEMORAL ARTERY STENT Left 05/2012; ~ 2015   Archie Endo 06/04/2012; Raechel Chute report  . FRACTURE SURGERY    . GIVENS CAPSULE STUDY N/A 12/22/2015   Procedure: GIVENS CAPSULE STUDY;  Surgeon: Wonda Horner, MD;  Location: [facility] ENDOSCOPY;  Service: Endoscopy;  Laterality: N/A;  . HARDWARE REMOVAL Right 11/15/2011   Removal of deep frontozygomatic orbital hardware/notes 11/15/2011  . HERNIA REPAIR  3825   Umbilical  . LAPAROSCOPY N/A 02/27/2016   Procedure: LAPAROSCOPY, LAPAROTOMY  WITH TWO SMALL BOWEL RESECTION;  Surgeon: Johnathan Hausen, MD;  Location: [facility] ORS;  Service: General;  Laterality: N/A;  . ORIF ORBITAL FRACTURE Right 08/15/2010    caught in a hydraulic machine; open reduction internal fixation of orbital rim fracture and open reduction of zygomatic arch fracture  Archie Endo 10/13/2009  . PORTACATH PLACEMENT Left 04/04/2016   Procedure: INSERTION PORT-A-CATH LEFT CHEST;  Surgeon: Melrose Nakayama, MD;  Location: [facility];  Service: Thoracic;  Laterality: Left;  Marland Kitchen VIDEO ASSISTED THORACOSCOPY (VATS)/ LOBECTOMY Right 10/18/2015   Procedure: VIDEO ASSISTED THORACOSCOPY (VATS)/ LOBECTOMY;  Surgeon: Melrose Nakayama, MD;  Location: [facility];  Service: Thoracic;  Laterality:  Right;  Marland Kitchen VIDEO BRONCHOSCOPY Bilateral 09/21/2015   Procedure: VIDEO BRONCHOSCOPY WITH FLUORO;  Surgeon: Juanito Doom, MD;  Location: [facility] ENDOSCOPY;  Service: Cardiopulmonary;  Laterality: Bilateral;    REVIEW OF SYSTEMS:   Review of Systems  Constitutional: Positive for appetite change and 18 lb weight gain secondary to swelling. Negative for chills and fever. HENT: Negative for mouth sores, nosebleeds, sore throat and trouble swallowing.  Eyes: Negative for eye problems and icterus.  Respiratory: Negative for cough, hemoptysis, shortness of breath and wheezing.   Cardiovascular: Positive for bilateral lower extremity swelling feet-abdomen. Negative for chest pain. Gastrointestinal: Positive for mild nausea/vomiting. Negative for abdominal pain, constipation, and diarrhea.  Genitourinary: Negative for bladder incontinence, difficulty urinating, dysuria, frequency and hematuria.   Musculoskeletal: Positive for back pain  and difficulty ambulating secondary to bilateral lower extremity swelling. Negative for neck pain and neck stiffness.  Skin: Negative for itching and rash.  Neurological: Positive for chronic headaches secondary to his head injury in 2012. Negative for dizziness, extremity weakness, gait problem,  light-headedness and seizures.  Hematological: Negative for adenopathy. Does not bruise/bleed easily.  Psychiatric/Behavioral: Negative for confusion, depression and sleep disturbance. The patient is not nervous/anxious.     PHYSICAL EXAMINATION:  Blood pressure 105/71, pulse (!) 103, temperature 98.3 F (36.8 C), temperature source Temporal, resp. rate 18, height 5' 7"  (1.702 m), weight 116 lb 8 oz (52.8 kg), SpO2 100 %.  ECOG PERFORMANCE STATUS: 2 - Symptomatic, <50% confined to bed  Physical Exam  Constitutional: Oriented to person, place, and time and cachectic appearing male and in no distress.  HENT:  Head: Normocephalic and atraumatic.  Mouth/Throat: Oropharynx is clear and moist. No oropharyngeal exudate.  Eyes: Conjunctivae are normal. Right eye exhibits no discharge. Left eye exhibits no discharge. No scleral icterus.  Neck: Normal range of motion. Neck supple.  Cardiovascular: Normal rate, regular rhythm, normal heart sounds and intact distal pulses.   Pulmonary/Chest: Effort normal and breath sounds normal. No respiratory distress. No wheezes. No rales.  Abdominal: Pitting edema in his abdomen. Soft. Bowel sounds are normal. Exhibits no distension and no mass. There is no tenderness.  Musculoskeletal: Significant bilateral lower extremity pitting edema to his abdomen. Moderate swelling of his hands bilaterally. Normal range of motion.  Lymphadenopathy:    No cervical adenopathy.  Neurological: Alert and oriented to person, place, and time. Exhibits Coordination normal.  Skin: Skin is warm and dry. No rash noted. Not diaphoretic. No erythema. No pallor.  Psychiatric: Mood, memory and  judgment normal.  Vitals reviewed.  LABORATORY DATA: Lab Results  Component Value Date   WBC 11.0 (H) 03/12/2019   HGB 12.2 (L) 03/12/2019   HCT 36.9 (L) 03/12/2019   MCV 98.7 03/12/2019   PLT 644 (H) 03/12/2019      Chemistry      Component Value Date/Time   NA 136 03/12/2019 1309   NA 140 01/23/2017 0910   K 3.7 03/12/2019 1309   K 4.5 01/23/2017 0910   CL 101 03/12/2019 1309   CO2 30 03/12/2019 1309   CO2 28 01/23/2017 0910   BUN 8 03/12/2019 1309   BUN 8.1 01/23/2017 0910   CREATININE 0.53 (L) 03/12/2019 1309   CREATININE 0.8 01/23/2017 0910   GLU 398 (H) 08/08/2016 1257      Component Value Date/Time   CALCIUM 7.6 (L) 03/12/2019 1309   CALCIUM 9.6 01/23/2017 0910   ALKPHOS 164 (H) 03/12/2019 1309   ALKPHOS 113 01/23/2017 0910  AST 25 03/12/2019 1309   AST 11 01/23/2017 0910   ALT 18 03/12/2019 1309   ALT 12 01/23/2017 0910   BILITOT 0.3 03/12/2019 1309   BILITOT 0.40 01/23/2017 0910       RADIOGRAPHIC STUDIES:  No results found.   ASSESSMENT/PLAN:  This is a very pleasant 59 year old Caucasian male with metastatic non-small cell lung cancer, poorly differentiated adenocarcinoma.  He presented with a right upper lobe lung mass.  He was initially diagnosed in September 2017.   He is status post right upper lobectomy with lymph node dissection.  Unfortunately he was found to have metastatic disease in the jejunum and terminal ileum.  He underwent surgical resection of the proximal and distal jejunum as well as proximal ileum.  The final pathology was consistent with high-grade carcinoma.  The patient underwent systemic chemotherapy with carboplatin and Alimta for 2 cycles.  This is discontinued secondary to disease progression.  He is status post 40 cycles on second line treatment with Keytruda 200 mg IV every 3 weeks.  He then underwent 2 cycles of docetaxel 75 mg/m and Cyramza 10 mg/kg IV every 3 weeks with Neulasta support.  This was discontinued  secondary to disease progression.  He recently had a CT-guided biopsy of the left retroperitoneal lymph node which was most consistent with his prior diagnosis of poorly differentiated carcinoma.   At the patient's last appointment, the patient was given the option of systemic chemotherapy with gemcitabine 1000 mg/m on days 1 and 8 IV every 3 weeks versus a referral to hospice.  The patient opted to pursue treatment with gemcitabine. He is status post his first cycle and he tolerated it fairly well.   The patient was seen with Dr. Julien Nordmann today. Labs were reviewed. His albumin is lower at 2.0 today. We will arrange for the patient to receive 20 mg of Lasix IV while receiving infusion tomorrow.  I have also sent a prescription to the pharmacy for 20 mg twice daily for 7 days of furosemide followed by 20 mg once daily thereon after.  I have also sent a prescription of 20 mEq of potassium to his pharmacy.  He was instructed to begin taking these tonight.  In the meantime, the patient was encouraged to increase his protein calorie intake.  He was encouraged to elevate his lower extremities and use compression stockings.  He will also start taking his Marinol as prescribed.  He was advised to decrease his salt intake.  We have sent a refill of MS Contin 15 mg twice daily to the patient's pharmacy.  The patient will continue using oxycodone for breakthrough pain.  It appears that the patient is not due for refill at this time and was prescribed 240 tablets less than a month ago.   He will receive cycle #2 of his treatment tomorrow as scheduled.   I will arrange for the patient to have a restaging CT scan of the chest, abdomen, and pelvis prior to cycle #3 of treatment.  I will give the patient the oral contrast when we see him next week. We will see him in 1 week for a follow-up visit before starting day 8 cycle #2.  He was advised to start taking a calcium supplement for his low calcium.  The patient was  advised to call immediately if he has any concerning symptoms in the interval. The patient voices understanding of current disease status and treatment options and is in agreement with the current care plan. All questions  were answered. The patient knows to call the clinic with any problems, questions or concerns. We can certainly see the patient much sooner if necessary.  Orders Placed This Encounter  Procedures  . CT Chest W Contrast    Standing Status:   Future    Standing Expiration Date:   03/11/2020    Order Specific Question:   ** REASON FOR EXAM (FREE TEXT)    Answer:   Restaging Lung Cancer    Order Specific Question:   If indicated for the ordered procedure, I authorize the administration of contrast media per Radiology protocol    Answer:   Yes    Order Specific Question:   Preferred imaging location?    Answer:   [facility]    Order Specific Question:   Radiology Contrast Protocol - do NOT remove file path    Answer:   \\[email or web address]  . CT Abdomen Pelvis W Contrast    Standing Status:   Future    Standing Expiration Date:   03/11/2020    Order Specific Question:   ** REASON FOR EXAM (FREE TEXT)    Answer:   Restaging Lung Cancer    Order Specific Question:   If indicated for the ordered procedure, I authorize the administration of contrast media per Radiology protocol    Answer:   Yes    Order Specific Question:   Preferred imaging location?    Answer:   [facility]    Order Specific Question:   Is Oral Contrast requested for this exam?    Answer:   Yes, Per Radiology protocol    Order Specific Question:   Radiology Contrast Protocol - do NOT remove file path    Answer:   \\[email or web address]     Brett Hazell L Anadalay Macdonell, PA-C 03/12/19  ADDENDUM: Hematology/Oncology Attending: I had a face-to-face encounter with the patient today.  I recommended his care plan.  This is a very pleasant 59 years old  white male with metastatic non-small cell lung cancer, adenocarcinoma status post several chemotherapy regimens as well as immunotherapy with Keytruda discontinued secondary to disease progression. The patient started second line systemic chemotherapy recently with single agent gemcitabine 1000 mg/M2 on days 1 and 8 every 3 weeks status post 1 cycle.  He tolerated the first cycle well except for fatigue.  The patient had significant weight gain with significant swelling of the lower extremities and up to the lower abdomen. I recommended for the patient to proceed with day 1 of cycle #2 of his chemotherapy tomorrow as planned. For the lower extremity edema and anasarca, we will start the patient on Lasix 20 mg p.o. twice daily for 1 week followed by 20 mg p.o. daily.  We will also provide the patient with potassium supplements. We will see the patient back for follow-up visit next week for reevaluation and adjustment of his diuretics and close monitoring. Unfortunately the patient continues to have poor prognosis.  We will schedule him for repeat CT scan of the chest, abdomen pelvis before starting cycle #3 for restaging of his disease and to see if the patient is benefiting from the current treatment or we consider palliative care and hospice at that time. The patient was also advised to call immediately if he has any concerning symptoms in the interval.  We will also discuss our recommendation with his wife.  Disclaimer: This note was dictated with voice recognition software. Similar sounding words can inadvertently be transcribed and may be missed upon review.  Eilleen Kempf, MD 03/13/19

## 2019-03-11 NOTE — Telephone Encounter (Signed)
I called patient this am to find out if they were coming and she stated she thought we were closed. Since he already missed several appts today and they were nervous about inclement weather, I told them Ariel will call from scheduling to get them rescheduled.

## 2019-03-12 ENCOUNTER — Other Ambulatory Visit: Payer: Self-pay

## 2019-03-12 ENCOUNTER — Inpatient Hospital Stay: Payer: Medicare HMO

## 2019-03-12 ENCOUNTER — Inpatient Hospital Stay (HOSPITAL_BASED_OUTPATIENT_CLINIC_OR_DEPARTMENT_OTHER): Payer: Medicare HMO | Admitting: Physician Assistant

## 2019-03-12 VITALS — BP 105/71 | HR 103 | Temp 98.3°F | Resp 18 | Ht 67.0 in | Wt 116.5 lb

## 2019-03-12 DIAGNOSIS — C784 Secondary malignant neoplasm of small intestine: Secondary | ICD-10-CM | POA: Diagnosis not present

## 2019-03-12 DIAGNOSIS — R609 Edema, unspecified: Secondary | ICD-10-CM

## 2019-03-12 DIAGNOSIS — G893 Neoplasm related pain (acute) (chronic): Secondary | ICD-10-CM | POA: Insufficient documentation

## 2019-03-12 DIAGNOSIS — C3491 Malignant neoplasm of unspecified part of right bronchus or lung: Secondary | ICD-10-CM

## 2019-03-12 DIAGNOSIS — R5383 Other fatigue: Secondary | ICD-10-CM | POA: Diagnosis not present

## 2019-03-12 DIAGNOSIS — R531 Weakness: Secondary | ICD-10-CM | POA: Diagnosis not present

## 2019-03-12 DIAGNOSIS — Z452 Encounter for adjustment and management of vascular access device: Secondary | ICD-10-CM | POA: Diagnosis not present

## 2019-03-12 DIAGNOSIS — C3411 Malignant neoplasm of upper lobe, right bronchus or lung: Secondary | ICD-10-CM | POA: Diagnosis not present

## 2019-03-12 DIAGNOSIS — Z5111 Encounter for antineoplastic chemotherapy: Secondary | ICD-10-CM | POA: Diagnosis not present

## 2019-03-12 DIAGNOSIS — I959 Hypotension, unspecified: Secondary | ICD-10-CM | POA: Diagnosis not present

## 2019-03-12 DIAGNOSIS — E162 Hypoglycemia, unspecified: Secondary | ICD-10-CM | POA: Diagnosis not present

## 2019-03-12 LAB — CBC WITH DIFFERENTIAL (CANCER CENTER ONLY)
Abs Immature Granulocytes: 0.1 10*3/uL — ABNORMAL HIGH (ref 0.00–0.07)
Basophils Absolute: 0 10*3/uL (ref 0.0–0.1)
Basophils Relative: 0 %
Eosinophils Absolute: 0 10*3/uL (ref 0.0–0.5)
Eosinophils Relative: 0 %
HCT: 36.9 % — ABNORMAL LOW (ref 39.0–52.0)
Hemoglobin: 12.2 g/dL — ABNORMAL LOW (ref 13.0–17.0)
Immature Granulocytes: 1 %
Lymphocytes Relative: 8 %
Lymphs Abs: 0.8 10*3/uL (ref 0.7–4.0)
MCH: 32.6 pg (ref 26.0–34.0)
MCHC: 33.1 g/dL (ref 30.0–36.0)
MCV: 98.7 fL (ref 80.0–100.0)
Monocytes Absolute: 0.7 10*3/uL (ref 0.1–1.0)
Monocytes Relative: 7 %
Neutro Abs: 9.3 10*3/uL — ABNORMAL HIGH (ref 1.7–7.7)
Neutrophils Relative %: 84 %
Platelet Count: 644 10*3/uL — ABNORMAL HIGH (ref 150–400)
RBC: 3.74 MIL/uL — ABNORMAL LOW (ref 4.22–5.81)
RDW: 15.1 % (ref 11.5–15.5)
WBC Count: 11 10*3/uL — ABNORMAL HIGH (ref 4.0–10.5)
nRBC: 0 % (ref 0.0–0.2)

## 2019-03-12 LAB — CMP (CANCER CENTER ONLY)
ALT: 18 U/L (ref 0–44)
AST: 25 U/L (ref 15–41)
Albumin: 2 g/dL — ABNORMAL LOW (ref 3.5–5.0)
Alkaline Phosphatase: 164 U/L — ABNORMAL HIGH (ref 38–126)
Anion gap: 5 (ref 5–15)
BUN: 8 mg/dL (ref 8–23)
CO2: 30 mmol/L (ref 22–32)
Calcium: 7.6 mg/dL — ABNORMAL LOW (ref 8.9–10.3)
Chloride: 101 mmol/L (ref 98–111)
Creatinine: 0.53 mg/dL — ABNORMAL LOW (ref 0.61–1.24)
GFR, Est AFR Am: >60 mL/min (ref >60)
GFR, Estimated: >60 mL/min (ref >60)
Glucose, Bld: 62 mg/dL — ABNORMAL LOW (ref 70–99)
Potassium: 3.7 mmol/L (ref 3.5–5.1)
Sodium: 136 mmol/L (ref 135–145)
Total Bilirubin: 0.3 mg/dL (ref 0.3–1.2)
Total Protein: 4.7 g/dL — ABNORMAL LOW (ref 6.5–8.1)

## 2019-03-12 MED ORDER — FUROSEMIDE 20 MG PO TABS: ORAL_TABLET | ORAL | 0 refills | Status: AC

## 2019-03-12 MED ORDER — POTASSIUM CHLORIDE CRYS ER 20 MEQ PO TBCR: 20.0000 meq | EXTENDED_RELEASE_TABLET | Freq: Every day | ORAL | 0 refills | Status: AC

## 2019-03-12 MED ORDER — MORPHINE SULFATE ER 15 MG PO TBCR: 15.0000 mg | EXTENDED_RELEASE_TABLET | Freq: Two times a day (BID) | ORAL | 0 refills | Status: AC

## 2019-03-13 ENCOUNTER — Inpatient Hospital Stay: Payer: Medicare HMO

## 2019-03-13 ENCOUNTER — Other Ambulatory Visit: Payer: Self-pay

## 2019-03-13 VITALS — BP 124/78 | HR 100 | Temp 98.9°F | Resp 16

## 2019-03-13 DIAGNOSIS — R531 Weakness: Secondary | ICD-10-CM | POA: Diagnosis not present

## 2019-03-13 DIAGNOSIS — I959 Hypotension, unspecified: Secondary | ICD-10-CM | POA: Diagnosis not present

## 2019-03-13 DIAGNOSIS — R5383 Other fatigue: Secondary | ICD-10-CM | POA: Diagnosis not present

## 2019-03-13 DIAGNOSIS — R609 Edema, unspecified: Secondary | ICD-10-CM

## 2019-03-13 DIAGNOSIS — C784 Secondary malignant neoplasm of small intestine: Secondary | ICD-10-CM | POA: Diagnosis not present

## 2019-03-13 DIAGNOSIS — C3411 Malignant neoplasm of upper lobe, right bronchus or lung: Secondary | ICD-10-CM | POA: Diagnosis not present

## 2019-03-13 DIAGNOSIS — Z5111 Encounter for antineoplastic chemotherapy: Secondary | ICD-10-CM | POA: Diagnosis not present

## 2019-03-13 DIAGNOSIS — Z452 Encounter for adjustment and management of vascular access device: Secondary | ICD-10-CM | POA: Diagnosis not present

## 2019-03-13 DIAGNOSIS — C3491 Malignant neoplasm of unspecified part of right bronchus or lung: Secondary | ICD-10-CM

## 2019-03-13 DIAGNOSIS — E162 Hypoglycemia, unspecified: Secondary | ICD-10-CM | POA: Diagnosis not present

## 2019-03-13 MED ORDER — FUROSEMIDE 10 MG/ML IJ SOLN
INTRAMUSCULAR | Status: AC
Start: 2019-03-13 — End: ?
  Filled 2019-03-13: qty 2

## 2019-03-13 MED ORDER — SODIUM CHLORIDE 0.9 % IV SOLN
1400.0000 mg | Freq: Once | INTRAVENOUS | Status: AC
  Administered 2019-03-13: 13:00:00 1400 mg via INTRAVENOUS
  Filled 2019-03-13: qty 36.82

## 2019-03-13 MED ORDER — PROCHLORPERAZINE MALEATE 10 MG PO TABS
ORAL_TABLET | ORAL | Status: AC
  Filled 2019-03-13: qty 1

## 2019-03-13 MED ORDER — PROCHLORPERAZINE MALEATE 10 MG PO TABS
10.0000 mg | ORAL_TABLET | Freq: Once | ORAL | Status: AC
  Administered 2019-03-13: 10 mg via ORAL

## 2019-03-13 MED ORDER — FUROSEMIDE 10 MG/ML IJ SOLN
20.0000 mg | Freq: Once | INTRAMUSCULAR | Status: AC
  Administered 2019-03-13: 13:00:00 20 mg via INTRAVENOUS

## 2019-03-13 MED ORDER — FUROSEMIDE 20 MG PO TABS: 20.0000 mg | ORAL_TABLET | Freq: Once | ORAL | Status: DC

## 2019-03-13 MED ORDER — HEPARIN SOD (PORK) LOCK FLUSH 100 UNIT/ML IV SOLN
500.0000 [IU] | Freq: Once | INTRAVENOUS | Status: AC | PRN
  Administered 2019-03-13: 500 [IU]
  Filled 2019-03-13: qty 5

## 2019-03-13 MED ORDER — SODIUM CHLORIDE 0.9% FLUSH
10.0000 mL | INTRAVENOUS | Status: DC | PRN
  Administered 2019-03-13: 10 mL
  Filled 2019-03-13: qty 10

## 2019-03-13 MED ORDER — SODIUM CHLORIDE 0.9 % IV SOLN
Freq: Once | INTRAVENOUS | Status: AC
  Filled 2019-03-13: qty 250

## 2019-03-13 NOTE — Patient Instructions (Signed)
[facility] Discharge Instructions for Patients Receiving Chemotherapy  Today you received the following chemotherapy agents: gemcitabine.  To help prevent nausea and vomiting after your treatment, we encourage you to take your nausea medication as directed.   If you develop nausea and vomiting that is not controlled by your nausea medication, call the clinic.   BELOW ARE SYMPTOMS THAT SHOULD BE REPORTED IMMEDIATELY:  *FEVER GREATER THAN 100.5 F  *CHILLS WITH OR WITHOUT FEVER  NAUSEA AND VOMITING THAT IS NOT CONTROLLED WITH YOUR NAUSEA MEDICATION  *UNUSUAL SHORTNESS OF BREATH  *UNUSUAL BRUISING OR BLEEDING  TENDERNESS IN MOUTH AND THROAT WITH OR WITHOUT PRESENCE OF ULCERS  *URINARY PROBLEMS  *BOWEL PROBLEMS  UNUSUAL RASH Items with * indicate a potential emergency and should be followed up as soon as possible.  Feel free to call the clinic should you have any questions or concerns. The clinic phone number is [phone].  Please show the CHEMO ALERT CARD at check-in to the Emergency Department and triage nurse.   

## 2019-03-13 NOTE — Progress Notes (Signed)
Pt wt incr d/t edema/anasarca. Gemzar dose to remain at 1400 mg today as planned. Assessed by Sandi Mealy, PA.  Kennith Center, Pharm.D., CPP 03/13/2019@12 :08 PM

## 2019-03-15 ENCOUNTER — Telehealth: Payer: Self-pay | Admitting: Physician Assistant

## 2019-03-15 NOTE — Telephone Encounter (Signed)
Added office visit per los to 2/25

## 2019-03-16 ENCOUNTER — Telehealth: Payer: Self-pay | Admitting: *Deleted

## 2019-03-16 ENCOUNTER — Other Ambulatory Visit: Payer: Self-pay | Admitting: Physician Assistant

## 2019-03-16 DIAGNOSIS — Z91041 Radiographic dye allergy status: Secondary | ICD-10-CM

## 2019-03-16 DIAGNOSIS — C3491 Malignant neoplasm of unspecified part of right bronchus or lung: Secondary | ICD-10-CM

## 2019-03-16 MED ORDER — PREDNISONE 50 MG PO TABS: ORAL_TABLET | ORAL | 3 refills | Status: AC

## 2019-03-16 NOTE — Telephone Encounter (Signed)
Reviewed Contrast Pre Med with wife.  No further questions at this time.

## 2019-03-16 NOTE — Telephone Encounter (Signed)
Patient has CT scheduled 03/30/2019, patient has allergy to prep and needs to pre medicate with Prednisone 13 hrs/7 hrs/2 hrs prior and Bendaryl 2 hrs.  Attempted to reach patients wife to relay message, phone not connecting.  Need to try later.  Prednisone 11:30 pm night prior, 5:30 am morning of and 10:30 am morning of Benadryl 1030 am morning of

## 2019-03-17 ENCOUNTER — Other Ambulatory Visit: Payer: Self-pay | Admitting: Internal Medicine

## 2019-03-17 DIAGNOSIS — G894 Chronic pain syndrome: Secondary | ICD-10-CM

## 2019-03-17 NOTE — Telephone Encounter (Signed)
Refill request  oxyCODONE-acetaminophen (PERCOCET) 10-325 MG tablet   [id number] - [place], [place] - [address] AT [address]

## 2019-03-18 ENCOUNTER — Other Ambulatory Visit: Payer: Self-pay

## 2019-03-18 ENCOUNTER — Inpatient Hospital Stay: Payer: Medicare HMO

## 2019-03-18 ENCOUNTER — Encounter: Payer: Self-pay | Admitting: Internal Medicine

## 2019-03-18 ENCOUNTER — Telehealth: Payer: Self-pay | Admitting: *Deleted

## 2019-03-18 ENCOUNTER — Telehealth: Payer: Self-pay | Admitting: Medical Oncology

## 2019-03-18 ENCOUNTER — Other Ambulatory Visit: Payer: Self-pay | Admitting: Medical Oncology

## 2019-03-18 ENCOUNTER — Inpatient Hospital Stay (HOSPITAL_BASED_OUTPATIENT_CLINIC_OR_DEPARTMENT_OTHER): Payer: Medicare HMO | Admitting: Internal Medicine

## 2019-03-18 ENCOUNTER — Encounter: Payer: Self-pay | Admitting: *Deleted

## 2019-03-18 VITALS — BP 90/76

## 2019-03-18 VITALS — BP 68/54 | HR 95 | Temp 98.5°F | Resp 19 | Ht 67.0 in

## 2019-03-18 DIAGNOSIS — Z95828 Presence of other vascular implants and grafts: Secondary | ICD-10-CM

## 2019-03-18 DIAGNOSIS — E162 Hypoglycemia, unspecified: Secondary | ICD-10-CM | POA: Diagnosis not present

## 2019-03-18 DIAGNOSIS — C171 Malignant neoplasm of jejunum: Secondary | ICD-10-CM

## 2019-03-18 DIAGNOSIS — Z452 Encounter for adjustment and management of vascular access device: Secondary | ICD-10-CM | POA: Diagnosis not present

## 2019-03-18 DIAGNOSIS — C3411 Malignant neoplasm of upper lobe, right bronchus or lung: Secondary | ICD-10-CM | POA: Diagnosis not present

## 2019-03-18 DIAGNOSIS — Z5111 Encounter for antineoplastic chemotherapy: Secondary | ICD-10-CM

## 2019-03-18 DIAGNOSIS — C3491 Malignant neoplasm of unspecified part of right bronchus or lung: Secondary | ICD-10-CM

## 2019-03-18 DIAGNOSIS — E1159 Type 2 diabetes mellitus with other circulatory complications: Secondary | ICD-10-CM

## 2019-03-18 DIAGNOSIS — I959 Hypotension, unspecified: Secondary | ICD-10-CM

## 2019-03-18 DIAGNOSIS — R531 Weakness: Secondary | ICD-10-CM | POA: Diagnosis not present

## 2019-03-18 DIAGNOSIS — C784 Secondary malignant neoplasm of small intestine: Secondary | ICD-10-CM | POA: Diagnosis not present

## 2019-03-18 DIAGNOSIS — R5383 Other fatigue: Secondary | ICD-10-CM | POA: Diagnosis not present

## 2019-03-18 LAB — CBC WITH DIFFERENTIAL (CANCER CENTER ONLY)
Abs Immature Granulocytes: 0.1 10*3/uL — ABNORMAL HIGH (ref 0.00–0.07)
Band Neutrophils: 4 %
Basophils Absolute: 0 10*3/uL (ref 0.0–0.1)
Basophils Relative: 0 %
Eosinophils Absolute: 0 10*3/uL (ref 0.0–0.5)
Eosinophils Relative: 0 %
HCT: 31 % — ABNORMAL LOW (ref 39.0–52.0)
Hemoglobin: 10.4 g/dL — ABNORMAL LOW (ref 13.0–17.0)
Lymphocytes Relative: 4 %
Lymphs Abs: 0.2 10*3/uL — ABNORMAL LOW (ref 0.7–4.0)
MCH: 33.1 pg (ref 26.0–34.0)
MCHC: 33.5 g/dL (ref 30.0–36.0)
MCV: 98.7 fL (ref 80.0–100.0)
Metamyelocytes Relative: 1 %
Monocytes Absolute: 0.1 10*3/uL (ref 0.1–1.0)
Monocytes Relative: 1 %
Neutro Abs: 5.5 10*3/uL (ref 1.7–7.7)
Neutrophils Relative %: 90 %
Platelet Count: 365 10*3/uL (ref 150–400)
RBC: 3.14 MIL/uL — ABNORMAL LOW (ref 4.22–5.81)
RDW: 14.9 % (ref 11.5–15.5)
WBC Count: 5.8 10*3/uL (ref 4.0–10.5)
nRBC: 0 % (ref 0.0–0.2)

## 2019-03-18 LAB — CMP (CANCER CENTER ONLY)
ALT: 21 U/L (ref 0–44)
AST: 40 U/L (ref 15–41)
Albumin: 1.5 g/dL — ABNORMAL LOW (ref 3.5–5.0)
Alkaline Phosphatase: 142 U/L — ABNORMAL HIGH (ref 38–126)
Anion gap: 6 (ref 5–15)
BUN: 10 mg/dL (ref 8–23)
CO2: 30 mmol/L (ref 22–32)
Calcium: 7.4 mg/dL — ABNORMAL LOW (ref 8.9–10.3)
Chloride: 101 mmol/L (ref 98–111)
Creatinine: 0.43 mg/dL — ABNORMAL LOW (ref 0.61–1.24)
GFR, Est AFR Am: >60 mL/min (ref >60)
GFR, Estimated: >60 mL/min (ref >60)
Glucose, Bld: 31 mg/dL — CL (ref 70–99)
Potassium: 3.7 mmol/L (ref 3.5–5.1)
Sodium: 137 mmol/L (ref 135–145)
Total Bilirubin: 0.3 mg/dL (ref 0.3–1.2)
Total Protein: 4.3 g/dL — ABNORMAL LOW (ref 6.5–8.1)

## 2019-03-18 LAB — GLUCOSE, CAPILLARY: Glucose-Capillary: 87 mg/dL (ref 70–99)

## 2019-03-18 MED ORDER — OXYCODONE-ACETAMINOPHEN 10-325 MG PO TABS: 1.0000 | ORAL_TABLET | Freq: Four times a day (QID) | ORAL | 0 refills | Status: AC | PRN

## 2019-03-18 MED ORDER — SODIUM CHLORIDE 0.9 % IV SOLN
Freq: Once | INTRAVENOUS | Status: AC
  Filled 2019-03-18: qty 250

## 2019-03-18 MED ORDER — SODIUM CHLORIDE 0.9% FLUSH
10.0000 mL | INTRAVENOUS | Status: DC | PRN
  Administered 2019-03-18: 10 mL
  Filled 2019-03-18: qty 10

## 2019-03-18 MED ORDER — DEXTROSE 50 % IV SOLN
1.0000 | Freq: Once | INTRAVENOUS | Status: AC
  Administered 2019-03-18: 10:00:00 50 mL via INTRAVENOUS
  Filled 2019-03-18: qty 50

## 2019-03-18 NOTE — Telephone Encounter (Signed)
Referred pt to Hospice.

## 2019-03-18 NOTE — Patient Instructions (Signed)
Hypoglycemia Hypoglycemia is when the sugar (glucose) level in your blood is too low. Signs of low blood sugar may include:  Feeling: ? Hungry. ? Worried or nervous (anxious). ? Sweaty and clammy. ? Confused. ? Dizzy. ? Sleepy. ? Sick to your stomach (nauseous).  Having: ? A fast heartbeat. ? A headache. ? A change in your vision. ? Tingling or no feeling (numbness) around your mouth, lips, or tongue. ? Jerky movements that you cannot control (seizure).  Having trouble with: ? Moving (coordination). ? Sleeping. ? Passing out (fainting). ? Getting upset easily (irritability). Low blood sugar can happen to people who have diabetes and people who do not have diabetes. Low blood sugar can happen quickly, and it can be an emergency. Treating low blood sugar Low blood sugar is often treated by eating or drinking something sugary right away, such as:  Fruit juice, 4-6 oz (120-150 mL).  Regular soda (not diet soda), 4-6 oz (120-150 mL).  Low-fat milk, 4 oz (120 mL).  Several pieces of hard candy.  Sugar or honey, 1 Tbsp (15 mL). Treating low blood sugar if you have diabetes If you can think clearly and swallow safely, follow the 15:15 rule:  Take 15 grams of a fast-acting carb (carbohydrate). Talk with your doctor about how much you should take.  Always keep a source of fast-acting carb with you, such as: ? Sugar tablets (glucose pills). Take 3-4 pills. ? 6-8 pieces of hard candy. ? 4-6 oz (120-150 mL) of fruit juice. ? 4-6 oz (120-150 mL) of regular (not diet) soda. ? 1 Tbsp (15 mL) honey or sugar.  Check your blood sugar 15 minutes after you take the carb.  If your blood sugar is still at or below 70 mg/dL (3.9 mmol/L), take 15 grams of a carb again.  If your blood sugar does not go above 70 mg/dL (3.9 mmol/L) after 3 tries, get help right away.  After your blood sugar goes back to normal, eat a meal or a snack within 1 hour.  Treating very low blood sugar If your  blood sugar is at or below 54 mg/dL (3 mmol/L), you have very low blood sugar (severe hypoglycemia). This may also cause:  Passing out.  Jerky movements you cannot control (seizure).  Losing consciousness (coma). This is an emergency. Do not wait to see if the symptoms will go away. Get medical help right away. Call your local emergency services (911 in the [place] Do not drive yourself to the hospital. If you have very low blood sugar and you cannot eat or drink, you may need a glucagon shot (injection). A family member or friend should learn how to check your blood sugar and how to give you a glucagon shot. Ask your doctor if you need to have a glucagon shot kit at home. Follow these instructions at home: General instructions  Take over-the-counter and prescription medicines only as told by your doctor.  Stay aware of your blood sugar as told by your doctor.  Limit alcohol intake to no more than 1 drink a day for nonpregnant women and 2 drinks a day for men. One drink equals 12 oz of beer (355 mL), 5 oz of wine (148 mL), or 1 oz of hard liquor (44 mL).  Keep all follow-up visits as told by your doctor. This is important. If you have diabetes:   Follow your diabetes care plan as told by your doctor. Make sure you: ? Know the signs of low blood sugar. ?  Take your medicines as told. ? Follow your exercise and meal plan. ? Eat on time. Do not skip meals. ? Check your blood sugar as often as told by your doctor. Always check it before and after exercise. ? Follow your sick day plan when you cannot eat or drink normally. Make this plan ahead of time with your doctor.  Share your diabetes care plan with: ? Your work or school. ? People you live with.  Check your pee (urine) for ketones: ? When you are sick. ? As told by your doctor.  Carry a card or wear jewelry that says you have diabetes. Contact a doctor if:  You have trouble keeping your blood sugar in your target  range.  You have low blood sugar often. Get help right away if:  You still have symptoms after you eat or drink something sugary.  Your blood sugar is at or below 54 mg/dL (3 mmol/L).  You have jerky movements that you cannot control.  You pass out. These symptoms may be an emergency. Do not wait to see if the symptoms will go away. Get medical help right away. Call your local emergency services (911 in the [place] Do not drive yourself to the hospital. Summary  Hypoglycemia happens when the level of sugar (glucose) in your blood is too low.  Low blood sugar can happen to people who have diabetes and people who do not have diabetes. Low blood sugar can happen quickly, and it can be an emergency.  Make sure you know the signs of low blood sugar and know how to treat it.  Always keep a source of sugar (fast-acting carb) with you to treat low blood sugar. This information is not intended to replace advice given to you by your health care provider. Make sure you discuss any questions you have with your health care provider. Document Revised: 04/30/2018 Document Reviewed: 02/10/2015 Elsevier Patient Education  [place].   Dehydration, Adult Dehydration is a condition in which there is not enough water or other fluids in the body. This happens when a person loses more fluids than he or she takes in. Important organs, such as the kidneys, brain, and heart, cannot function without a proper amount of fluids. Any loss of fluids from the body can lead to dehydration. Dehydration can be mild, moderate, or severe. It should be treated right away to prevent it from becoming severe. What are the causes? Dehydration may be caused by:  Conditions that cause loss of water or other fluids, such as diarrhea, vomiting, or sweating or urinating a lot.  Not drinking enough fluids, especially when you are ill or doing activities that require a lot of energy.  Other illnesses and conditions,  such as fever or infection.  Certain medicines, such as medicines that remove excess fluid from the body (diuretics).  Lack of safe drinking water.  Not being able to get enough water and food. What increases the risk? The following factors may make you more likely to develop this condition:  Having a long-term (chronic) illness that has not been treated properly, such as diabetes, heart disease, or kidney disease.  Being 19 years of age or older.  Having a disability.  Living in a place that is high in altitude, where thinner, drier air causes more fluid loss.  Doing exercises that put stress on your body for a long time (endurance sports). What are the signs or symptoms? Symptoms of dehydration depend on how severe it is.  Mild or moderate dehydration  Thirst.  Dry lips or dry mouth.  Dizziness or light-headedness, especially when standing up from a seated position.  Muscle cramps.  Dark urine. Urine may be the color of tea.  Less urine or tears produced than usual.  Headache. Severe dehydration  Changes in skin. Your skin may be cold and clammy, blotchy, or pale. Your skin also may not return to normal after being lightly pinched and released.  Little or no tears, urine, or sweat.  Changes in vital signs, such as rapid breathing and low blood pressure. Your pulse may be weak or may be faster than 100 beats a minute when you are sitting still.  Other changes, such as: ? Feeling very thirsty. ? Sunken eyes. ? Cold hands and feet. ? Confusion. ? Being very tired (lethargic) or having trouble waking from sleep. ? Short-term weight loss. ? Loss of consciousness. How is this diagnosed? This condition is diagnosed based on your symptoms and a physical exam. You may have blood and urine tests to help confirm the diagnosis. How is this treated? Treatment for this condition depends on how severe it is. Treatment should be started right away. Do not wait until  dehydration becomes severe. Severe dehydration is an emergency and needs to be treated in a hospital.  Mild or moderate dehydration can be treated at home. You may be asked to: ? Drink more fluids. ? Drink an oral rehydration solution (ORS). This drink helps restore proper amounts of fluids and salts and minerals in the blood (electrolytes).  Severe dehydration can be treated: ? With IV fluids. ? By correcting abnormal levels of electrolytes. This is often done by giving electrolytes through a tube that is passed through your nose and into your stomach (nasogastric tube, or NG tube). ? By treating the underlying cause of dehydration. Follow these instructions at home: Oral rehydration solution If told by your health care provider, drink an ORS:  Make an ORS by following instructions on the package.  Start by drinking small amounts, about  cup (120 mL) every 5-10 minutes.  Slowly increase how much you drink until you have taken the amount recommended by your health care provider. Eating and drinking         Drink enough clear fluid to keep your urine pale yellow. If you were told to drink an ORS, finish the ORS first and then start slowly drinking other clear fluids. Drink fluids such as: ? Water. Do not drink only water. Doing that can lead to hyponatremia, which is having too little salt (sodium) in the body. ? Water from ice chips you suck on. ? Fruit juice that you have added water to (diluted fruit juice). ? Low-calorie sports drinks.  Eat foods that contain a healthy balance of electrolytes, such as bananas, oranges, potatoes, tomatoes, and spinach.  Do not drink alcohol.  Avoid the following: ? Drinks that contain a lot of sugar. These include high-calorie sports drinks, fruit juice that is not diluted, and soda. ? Caffeine. ? Foods that are greasy or contain a lot of fat or sugar. General instructions  Take over-the-counter and prescription medicines only as told by  your health care provider.  Do not take sodium tablets. Doing that can lead to having too much sodium in the body (hypernatremia).  Return to your normal activities as told by your health care provider. Ask your health care provider what activities are safe for you.  Keep all follow-up visits as told by  your health care provider. This is important. Contact a health care provider if:  You have muscle cramps, pain, or discomfort, such as: ? Pain in your abdomen and the pain gets worse or stays in one area (localizes). ? Stiff neck.  You have a rash.  You are more irritable than usual.  You are sleepier or have a harder time waking than usual.  You feel weak or dizzy.  You feel very thirsty. Get help right away if you have:  Any symptoms of severe dehydration.  Symptoms of vomiting, such as: ? You cannot eat or drink without vomiting. ? Vomiting gets worse or does not go away. ? Vomit includes blood or green matter (bile).  Symptoms that get worse with treatment.  A fever.  A severe headache.  Problems with urination or bowel movements, such as: ? Diarrhea that gets worse or does not go away. ? Blood in your stool (feces). This may cause stool to look black and tarry. ? Not urinating, or urinating only a small amount of very dark urine, within 6-8 hours.  Trouble breathing. These symptoms may represent a serious problem that is an emergency. Do not wait to see if the symptoms will go away. Get medical help right away. Call your local emergency services (911 in the [place] Do not drive yourself to the hospital. Summary  Dehydration is a condition in which there is not enough water or other fluids in the body. This happens when a person loses more fluids than he or she takes in.  Treatment for this condition depends on how severe it is. Treatment should be started right away. Do not wait until dehydration becomes severe.  Drink enough clear fluid to keep your urine pale  yellow. If you were told to drink an oral rehydration solution (ORS), finish the ORS first and then start slowly drinking other clear fluids.  Take over-the-counter and prescription medicines only as told by your health care provider.  Get help right away if you have any symptoms of severe dehydration. This information is not intended to replace advice given to you by your health care provider. Make sure you discuss any questions you have with your health care provider. Document Revised: 08/20/2018 Document Reviewed: 08/20/2018 Elsevier Patient Education  [place].

## 2019-03-18 NOTE — Progress Notes (Signed)
Oncology Nurse Navigator Documentation  Oncology Nurse Navigator Flowsheets 03/18/2019  Abnormal Finding Date -  Confirmed Diagnosis Date -  Diagnosis Status -  Planned Course of Treatment -  Phase of Treatment -  Chemotherapy Actual Start Date: -  Chemotherapy Actual End Date: -  Targeted Therapy Actual Start Date: -  Targeted Therapy Actual End Date: -  Expected Surgery Date -  Surgery Actual Start Date: -  Navigator Follow Up Date: -  Navigator Follow Up Reason: -  Navigation Complete Date: 03/18/2019  Post Navigation: Continue to Follow Patient? No  Reason Not Navigating Patient: Hospice/Death  Navigator Location [facility]  Navigator Encounter Type Clinic/[facility]  Telephone -  Treatment Initiated Date -  Patient Visit Type MedOnc/patient to saw Dr. Julien Nordmann today.  He is not feeling well with low BP and BS.  He has a hard time keeping eyes open.  His wife at his side and tearful.  I offered pshyco-social support.   Treatment Phase -  Barriers/Navigation Needs -  Education -  Interventions Psycho-Social Support  Acuity Level 2-Minimal Needs (1-2 Barriers Identified)  Coordination of Care -  Education Method -  Support Groups/Services -  Time Spent with Patient 15

## 2019-03-18 NOTE — Progress Notes (Signed)
11:50 CBG recheck: 87.  Dr. Julien Nordmann notified.

## 2019-03-18 NOTE — Progress Notes (Signed)
Brett [name]   [phone]  OFFICE PROGRESS NOTE  Sid Falcon, MD [place] [address]  DIAGNOSIS: Stage IV (T1b, N0, M1b) non-small cell lung cancer, adenocarcinoma presented with right upper lobe lung nodule and recent metastasis to the small intestine. This was initially diagnosed in September 2017.  Genomic Alterations Identified? ERBB2 amplification - equivocal? CDKN2A p16INK4a E88* and p14ARF Q333L SMARCA4 splice site [id number]>DS SPTA1 E2022* TOP2A amplification TP53 A159P Additional Findings? Microsatellite status MS-Stable Tumor Mutation Burden TMB-Intermediate; 18 Muts/Mb Additional Disease-relevant Genes with No Reportable Alterations Identified? EGFR KRAS ALK BRAF MET RET ROS1   PRIOR THERAPY:  1) Status post right VATS with right upper lobectomy and mediastinal lymph node dissection under the care of Dr. Roxan Hockey on 10/18/2015 and the final pathology was consistent with stage IA (T1b, N0, MX). 2) upper endoscopy on 01/05/2016 showed normal esophagus, normal stomach but there was occasional mass around 3.0 CM in length circumferential nonobstructing in the jejunum. The final pathology was consistent with metastatic adenocarcinoma. 3) status post laparoscopic laparotomy and resection of proximal lesion and and distal jejunum/proximal ileum under the care of Dr. Hassell Done 1 02/27/2016. 3)  Systemic chemotherapy with carboplatin for AUC of 5 and Alimta 500 MG/M2 every 3 weeks. First dose 04/04/2016. Status post 2 cycles. Last dose was given 04/21/2016 discontinued secondary to disease progression. 4) Second line immunotherapy with Ketruda 200 mg IV every 2 weeks, first dose 05/30/2016. Status post 40 cycles.  Last dose October 22, 2018.  Discontinued today secondary to disease progression. 5)  Systemic chemotherapy with docetaxel 75 mg/M2 and Cyramza 10 mg/KG every 3 weeks.  First dose November 19, 2018.  This treatment was discontinued secondary to disease progression.  CURRENT THERAPY: Systemic chemotherapy with gemcitabine 1000 mg/M2 on days 1 and 8 every 3 weeks.  Status post 1 cycle.   INTERVAL HISTORY: Brett [name] 59 y.o. male returns to the clinic today for follow-up visit accompanied by his wife.  The patient was supposed to start day 8 of cycle #2 today.  He is very weak and fatigued and hypotensive.  He did not sleep last night and he took 2 oxycodone 10/325.  He is so sleepy this morning.  He has no current chest pain or shortness of breath and no cough.  He had significant swelling of the lower extremities and lower abdomen.  He has been on Lasix 40 mg p.o. daily with no improvement in his edema.  The patient denied having any current abdominal pain, nausea, vomiting, diarrhea or constipation.  He is here today for evaluation and discussion regarding his condition.  MEDICAL HISTORY: Past Medical History:  Diagnosis Date  . Barrett's esophagus 07/01/2013   Without dysplasia on biopsy 09/03/2012. Repeat EGD recommended 08/2015  . Bilateral cataracts 02/13/2017  . Carotid artery stenosis 07/01/2013   Requiring right sided stent   . Chronic pain syndrome 07/01/2013  . Closed head injury with brief loss of consciousness ([place]) 07/22/2010   Head trapped in a hydraulic device at work.  Fracture of orbital bones on right and brief loss of consciousness per report.  . 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 There was likely a strong psycho-social overlay in regard to the cognitive deficits in the form of mood disorder with psychotic features and mixed anxiety symptomatology. His primary  tested cognitive deficits are in the areas of attention, executi  . Daily headache "since 07/2010"   constantly  . Degenerative joint disease of cervical spine  07/01/2013  . Dupuytren's contracture of both hands 04/08/2014  . Encounter for antineoplastic chemotherapy 10/05/2015  . Encounter for antineoplastic immunotherapy 05/24/2016  . Erectile dysfunction associated with type 2 diabetes mellitus ([place]) 07/01/2013  . Fibromyalgia 07/01/2013  . Goals of care, counseling/discussion 03/28/2016  . History of blood transfusion 11/28/2015   "suppose to get his first today" (11/28/2015)  . Hyperlipidemia LDL goal < 100 07/01/2013  . Intractable hiccups 04/11/2016  . 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 Memory changes    "memory issues" from head injury  . Moderate protein-calorie malnutrition ([place]) 11/29/2015  . Osteoarthritis of right thumb 10/21/2014  . Peripheral vascular occlusive disease ([place]) 07/01/2013   Requiring 2 arterial stents above the left knee per report  . Pneumonia ~ 2006/2007  . Post traumatic stress disorder 07/01/2013  . Primary lung adenocarcinoma ([place]) dx'd 08/2015   "right lung"  . Severe major depression with psychotic features ([place]) 04/15/2011  . Tobacco abuse 07/01/2013  . Type 2 diabetes mellitus with vascular disease ([place]) 07/01/2013   Left lower extremity and right carotid stenting    ALLERGIES:  is allergic to gabapentin; lyrica [pregabalin]; jardiance [empagliflozin]; celebrex [celecoxib]; and contrast media [iodinated diagnostic agents].  MEDICATIONS:  Current Outpatient Medications  Medication Sig Dispense Refill  . ACCU-CHEK SOFTCLIX LANCETS lancets Use to test blood glucose 1-2 times daily. Dx Code E11.59 100 each 12  . aspirin EC 81 MG tablet Take 81 mg by mouth daily.     Marland Kitchen atorvastatin (LIPITOR) 40 MG tablet TAKE 1 TABLET(40 MG) BY MOUTH DAILY 90 tablet 3  . Blood Glucose Monitoring Suppl (ACCU-CHEK AVIVA PLUS) w/Device KIT Use to test blood glucose 1-2 times daily. Dx Code E11.59 1 kit 0  . dronabinol (MARINOL) 2.5 MG capsule Take 1 capsule (2.5 mg total) by mouth 2 (two) times daily before a meal. 60 capsule 0  .  furosemide (LASIX) 20 MG tablet Please take 1 tablet twice daily for 1 week, then take 1 tablet once a day as needed for swelling 30 tablet 0  . glipiZIDE (GLUCOTROL) 10 MG tablet Take 1 tablet (10 mg total) by mouth 2 (two) times daily before a meal. 180 tablet 3  . glucose blood test strip USE TO TEST BLOOD GLUCOSE 1 TO 2 TIMES DAILY 100 each 4  . insulin aspart (NOVOLOG) 100 UNIT/ML FlexPen Inject 1-9 units into the skin with your 2 biggest meals  based on the sliding scale provided to you. 5 pen 3  . Insulin Glargine (LANTUS) 100 UNIT/ML Solostar Pen Inject 5 Units into the skin daily. 15 mL 3  . Insulin Pen Needle (PEN NEEDLES) 32G X 4 MM MISC 1 pen by Does not apply route 2 (two) times daily. Dx Code E11.59 200 each 6  . levothyroxine (SYNTHROID) 50 MCG tablet Take 1 tablet (50 mcg total) by mouth daily before breakfast. 30 tablet 2  . morphine (MS CONTIN) 15 MG 12 hr tablet Take 1 tablet (15 mg total) by mouth every 12 (twelve) hours. 60 tablet 0  . oxyCODONE-acetaminophen (PERCOCET) 10-325 MG tablet Take 1 tablet by mouth every 6 (six) hours as needed for pain. 30 tablet 0  . oxyCODONE-acetaminophen (PERCOCET) 10-325 MG tablet Take 1-2 tablets by mouth every 6 (six) hours as needed for pain. 240 tablet 0  . pantoprazole (PROTONIX) 40  MG tablet TAKE 1 TABLET(40 MG) BY MOUTH DAILY (Patient taking differently: Take 40 mg by mouth daily. ) 90 tablet 1  . potassium chloride SA (KLOR-CON) 20 MEQ tablet Take 1 tablet (20 mEq total) by mouth daily. 30 tablet 0  . predniSONE (DELTASONE) 50 MG tablet Take one tablet 13 hours , one tablet 7 hours and one tablet 1 hours prior to CT scan. 3 tablet 3  . prochlorperazine (COMPAZINE) 10 MG tablet Take 1 tablet (10 mg total) by mouth every 6 (six) hours as needed for nausea or vomiting. 30 tablet 5  . sitaGLIPtin (JANUVIA) 100 MG tablet Take 1 tablet (100 mg total) by mouth daily. 90 tablet 3   No current facility-administered medications for this visit.     SURGICAL HISTORY:  Past Surgical History:  Procedure Laterality Date  . CAROTID STENT Right ?2014  . COLONOSCOPY N/A 11/30/2015   Procedure: COLONOSCOPY;  Surgeon: Teena Irani, MD;  Location[facility] ENDOSCOPY;  Service: Endoscopy;  Laterality: N/A;  . ESOPHAGOGASTRODUODENOSCOPY N/A 11/30/2015   Procedure: ESOPHAGOGASTRODUODENOSCOPY (EGD);  Surgeon: Teena Irani, MD;  Location:  [facility] ENDOSCOPY;  Service: Endoscopy;  Laterality: N/A;  . ESOPHAGOGASTRODUODENOSCOPY (EGD) WITH PROPOFOL N/A 01/05/2016   Procedure: ESOPHAGOGASTRODUODENOSCOPY (EGD) WITH PROPOFOL;  Surgeon: Teena Irani, MD;  Location: [facility] ENDOSCOPY;  Service: Endoscopy;  Laterality: N/A;  . FEMORAL ARTERY STENT Left 05/2012; ~ 2015   Ar[facility] Endo 06/04/2012; Raechel Chute report  . FRACTURE SURGERY    . GIVENS CAPSULE STUDY N/A 12/22/2015   Procedure: GIVENS CAPSULE STUDY;  Surgeon: Wonda Horner, MD;  Location: [facility] ENDOSCOPY;  Service: Endoscopy;  Laterality: N/A;  . HARDWARE REMOVAL Right 11/15/2011   Removal of deep frontozygomatic orbital hardware/notes 11/15/2011  . HERNIA REPAIR  8756   Umbilical  . LAPAROSCOPY N/A 02/27/2016   Procedure: LAPAROSCOPY, LAPAROTOMY  WITH TWO SMALL BOWEL RESECTION;  Surgeon: Johnathan Hausen, MD;  Location: [facility] ORS;  Service: General;  Laterality: N/A;  . ORIF ORBITAL FRACTURE Right 08/15/2010    caught in a hydraulic machine; open reduction internal fixation of orbital rim fracture and open reduction of zygomatic arch fracture  Ar[facility] Endo 10/13/2009  . PORTACATH PLACEMENT Left 04/04/2016   Procedure: INSERTION PORT-A-CATH LEFT CHEST;  Surgeon: Melrose Nakayama, MD;  Location: [place];  Service: Thoracic;  Laterality: Left;  Marland Kitchen VIDEO ASSISTED THORACOSCOPY (VATS)/ LOBECTOMY Right 10/18/2015   Procedure: VIDEO ASSISTED THORACOSCOPY (VATS)/ LOBECTOMY;  Surgeon: Melrose Nakayama, MD;  Location: [place];  Service: Thoracic;  Laterality: Right;  Marland Kitchen VIDEO BRONCHOSCOPY Bilateral 09/21/2015   Procedure: VIDEO BRONCHOSCOPY WITH FLUORO;   Surgeon: Juanito Doom, MD;  Location: [facility] ENDOSCOPY;  Service: Cardiopulmonary;  Laterality: Bilateral;    REVIEW OF SYSTEMS:  Constitutional: positive for anorexia and fatigue Eyes: negative Ears, nose, mouth, throat, and face: negative Respiratory: positive for dyspnea on exertion Cardiovascular: negative Gastrointestinal: negative Genitourinary:negative Integument/breast: negative Hematologic/lymphatic: negative Musculoskeletal:positive for muscle weakness Neurological: negative Behavioral/Psych: negative Endocrine: negative Allergic/Immunologic: negative   PHYSICAL EXAMINATION: General appearance: alert, cooperative, cachectic, fatigued and no distress Head: Normocephalic, without obvious abnormality, atraumatic Neck: no adenopathy, no JVD, supple, symmetrical, trachea midline and thyroid not enlarged, symmetric, no tenderness/mass/nodules Lymph nodes: Cervical, supraclavicular, and axillary nodes normal. Resp: clear to auscultation bilaterally Back: symmetric, no curvature. ROM normal. No CVA tenderness. Cardio: regular rate and rhythm, S1, S2 normal, no murmur, click, rub or gallop GI: soft, non-tender; bowel sounds normal; no masses,  no organomegaly Extremities: edema 3+ edema bilaterally Neurologic: Alert and oriented X 3, normal  strength and tone. Normal symmetric reflexes. Normal coordination and gait  ECOG PERFORMANCE STATUS: 1 - Symptomatic but completely ambulatory  Blood pressure (!) 68/54, pulse 95, temperature 98.5 F (36.9 C), temperature source Oral, resp. rate 19, height 5' 7"  (1.702 m), SpO2 99 %.  LABORATORY DATA: Lab Results  Component Value Date   WBC 5.8 03/18/2019   HGB 10.4 (L) 03/18/2019   HCT 31.0 (L) 03/18/2019   MCV 98.7 03/18/2019   PLT 365 03/18/2019      Chemistry      Component Value Date/Time   NA 136 03/12/2019 1309   NA 140 01/23/2017 0910   K 3.7 03/12/2019 1309   K 4.5 01/23/2017 0910   CL 101 03/12/2019 1309   CO2 30  03/12/2019 1309   CO2 28 01/23/2017 0910   BUN 8 03/12/2019 1309   BUN 8.1 01/23/2017 0910   CREATININE 0.53 (L) 03/12/2019 1309   CREATININE 0.8 01/23/2017 0910   GLU 398 (H) 08/08/2016 1257      Component Value Date/Time   CALCIUM 7.6 (L) 03/12/2019 1309   CALCIUM 9.6 01/23/2017 0910   ALKPHOS 164 (H) 03/12/2019 1309   ALKPHOS 113 01/23/2017 0910   AST 25 03/12/2019 1309   AST 11 01/23/2017 0910   ALT 18 03/12/2019 1309   ALT 12 01/23/2017 0910   BILITOT 0.3 03/12/2019 1309   BILITOT 0.40 01/23/2017 0910       RADIOGRAPHIC STUDIES: No results found.   ASSESSMENT AND PLAN:  This is a very pleasant 59 years old white male with metastatic non-small cell lung cancer, adenocarcinoma status post right upper lobectomy with lymph node dissection. Unfortunately the patient was found to have metastatic disease in the jejunum and terminal ileum.  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 The patient was started on treatment with systemic chemotherapy with carboplatin and Alimta for 2 cycles discontinued secondary to intolerance and disease progression. The patient completed treatment with second line immunotherapy with Ketruda (pembrolizumab) 200 mg IV every 3 weeks, status post 40 cycles.  He has been tolerating this treatment well. This treatment was discontinued secondary to disease progression. The patient was started on systemic chemotherapy with docetaxel and Cyramza status post 2 cycles.  This treatment was discontinued secondary to disease progression. I ordered a PET scan which was performed earlier today and it showed bulky adenopathy in the neck, chest as well as abdomen with similar appearance to the previous CT scan in December 2020 and showing hypermetabolic features. The patient underwent CT-guided core biopsy of one of the hypermetabolic  left retroperitoneal lymph nodes by interventional radiology and the final pathology is consistent with poorly differentiated adenocarcinoma. I discussed with the patient his option at that time including palliative care and hospice referral versus treatment with single agent gemcitabine.  The patient and his wife were interested in proceeding with systemic chemotherapy.  He is status post 1 cycle of gemcitabine 1000 mg/M2 on days 1 and 8 in addition to day 1 of cycle #2.  He was supposed to start day 8 of cycle #2 today but the patient has significant fatigue and weakness. I had a lengthy discussion with the patient and his wife again about his condition.  I strongly recommend for them to discontinue his current chemotherapy and consider the patient for palliative care and hospice referral.  His prognosis is very poor and his  life expectancy is probably less than 2 weeks.  And he would like to be home with his family. For the hypertension, I will arrange for the patient to receive 1 L of normal saline in the clinic today and if no improvement in his hypotension, will send him to the hospital for admission. For the hypoglycemia, we will give the patient a dose of D50 and monitor his blood sugar closely. The patient and his wife agreed to the palliative care on hospice referral at this point. The patient voices understanding of current disease status and treatment options and is in agreement with the current care plan. All questions were answered. The patient knows to call the clinic with any problems, questions or concerns. We can certainly see the patient much sooner if necessary.  Disclaimer: This note was dictated with voice recognition software. Similar sounding words can inadvertently be transcribed and may not be corrected upon review.

## 2019-03-18 NOTE — Telephone Encounter (Signed)
Late entry: 0957 Jay/lab called. Glucose 31. 1001- Diane, RN working with Dr. Julien Nordmann notified

## 2019-03-18 NOTE — Telephone Encounter (Signed)
Pt wife is calling regarding medicine [phone]

## 2019-03-18 NOTE — Progress Notes (Signed)
CRITICAL VALUE STICKER  CRITICAL VALUE:glucose  RECEIVER Brett Tavano Bell05 DATE & TIME NOTIFIED: 03/18/19 [place]  MD NOTIFIED: Julien Nordmann  TIME OF NOTIFICATION:1005  RESPONSE: 1010 1 amp of D50 given IVP and [name]  woke up and more alert and talking   in 15 seconds. Assisted to recliner in infusion.

## 2019-03-18 NOTE — Progress Notes (Signed)
Per Dr Julien Nordmann , pt may be discharged if BP improved and pt feels better.

## 2019-03-19 ENCOUNTER — Telehealth: Payer: Self-pay | Admitting: Internal Medicine

## 2019-03-19 NOTE — Telephone Encounter (Signed)
Scheduled per los. Called and spoke with wife. Informed that all appts had been cancelled. Patient didn't know if CT scan should still be done. Sent msg to MD and will contact patient again to confirm.

## 2019-03-22 ENCOUNTER — Other Ambulatory Visit: Payer: Self-pay

## 2019-03-22 ENCOUNTER — Emergency Department (HOSPITAL_COMMUNITY)
Admission: EM | Admit: 2019-03-22 | Discharge: 2019-03-22 | Disposition: A | Discharge status: Home / Self Care | Payer: Medicare HMO | Attending: Emergency Medicine | Admitting: Emergency Medicine

## 2019-03-22 DIAGNOSIS — C799 Secondary malignant neoplasm of unspecified site: Secondary | ICD-10-CM | POA: Insufficient documentation

## 2019-03-22 DIAGNOSIS — Z79899 Other long term (current) drug therapy: Secondary | ICD-10-CM | POA: Insufficient documentation

## 2019-03-22 DIAGNOSIS — R000 Tachycardia, unspecified: Secondary | ICD-10-CM | POA: Diagnosis not present

## 2019-03-22 DIAGNOSIS — E119 Type 2 diabetes mellitus without complications: Secondary | ICD-10-CM | POA: Diagnosis not present

## 2019-03-22 DIAGNOSIS — R531 Weakness: Secondary | ICD-10-CM | POA: Diagnosis present

## 2019-03-22 DIAGNOSIS — R600 Localized edema: Secondary | ICD-10-CM | POA: Diagnosis not present

## 2019-03-22 DIAGNOSIS — R609 Edema, unspecified: Secondary | ICD-10-CM | POA: Insufficient documentation

## 2019-03-22 DIAGNOSIS — F17210 Nicotine dependence, cigarettes, uncomplicated: Secondary | ICD-10-CM | POA: Insufficient documentation

## 2019-03-22 DIAGNOSIS — R0902 Hypoxemia: Secondary | ICD-10-CM | POA: Diagnosis not present

## 2019-03-22 DIAGNOSIS — I959 Hypotension, unspecified: Secondary | ICD-10-CM | POA: Diagnosis not present

## 2019-03-22 DIAGNOSIS — Z7984 Long term (current) use of oral hypoglycemic drugs: Secondary | ICD-10-CM | POA: Diagnosis not present

## 2019-03-22 DIAGNOSIS — Z7982 Long term (current) use of aspirin: Secondary | ICD-10-CM | POA: Insufficient documentation

## 2019-03-22 DIAGNOSIS — C189 Malignant neoplasm of colon, unspecified: Secondary | ICD-10-CM | POA: Diagnosis not present

## 2019-03-22 DIAGNOSIS — R069 Unspecified abnormalities of breathing: Secondary | ICD-10-CM | POA: Diagnosis not present

## 2019-03-22 DIAGNOSIS — R52 Pain, unspecified: Secondary | ICD-10-CM | POA: Diagnosis not present

## 2019-03-22 DIAGNOSIS — R0602 Shortness of breath: Secondary | ICD-10-CM | POA: Diagnosis not present

## 2019-03-22 LAB — CBC
HCT: 40.5 % (ref 39.0–52.0)
Hemoglobin: 13.2 g/dL (ref 13.0–17.0)
MCH: 32.4 pg (ref 26.0–34.0)
MCHC: 32.6 g/dL (ref 30.0–36.0)
MCV: 99.5 fL (ref 80.0–100.0)
Platelets: 101 10*3/uL — ABNORMAL LOW (ref 150–400)
RBC: 4.07 MIL/uL — ABNORMAL LOW (ref 4.22–5.81)
RDW: 15.3 % (ref 11.5–15.5)
WBC: 10.4 10*3/uL (ref 4.0–10.5)
nRBC: 0.3 % — ABNORMAL HIGH (ref 0.0–0.2)

## 2019-03-22 LAB — CBG MONITORING, ED: Glucose-Capillary: 130 mg/dL — ABNORMAL HIGH (ref 70–99)

## 2019-03-22 LAB — BASIC METABOLIC PANEL
Anion gap: 8 (ref 5–15)
BUN: 6 mg/dL — ABNORMAL LOW (ref 8–23)
CO2: 27 mmol/L (ref 22–32)
Calcium: 7.6 mg/dL — ABNORMAL LOW (ref 8.9–10.3)
Chloride: 100 mmol/L (ref 98–111)
Creatinine, Ser: 0.48 mg/dL — ABNORMAL LOW (ref 0.61–1.24)
GFR calc Af Amer: >60 mL/min (ref >60)
GFR calc non Af Amer: >60 mL/min (ref >60)
Glucose, Bld: 149 mg/dL — ABNORMAL HIGH (ref 70–99)
Potassium: 4 mmol/L (ref 3.5–5.1)
Sodium: 135 mmol/L (ref 135–145)

## 2019-03-22 MED ORDER — FUROSEMIDE 10 MG/ML IJ SOLN
40.0000 mg | Freq: Once | INTRAMUSCULAR | Status: DC
  Filled 2019-03-22: qty 4

## 2019-03-22 MED ORDER — SODIUM CHLORIDE 0.9% FLUSH: 3.0000 mL | Freq: Once | INTRAVENOUS | Status: DC

## 2019-03-22 NOTE — TOC Transition Note (Signed)
Transition of Care [facility]) - CM/SW Discharge Note   Patient Details  Name: Brett [name] MRN: [id number] Date of Birth: May 10, 1960  Transition of Care [facility]) CM/SW Contact: 70- Laurena Slimmer, RN Phone Number: 03/22/2019, 6:46 PM   Clinical Narrative:         Barriers to Discharge: No Barriers Identified   Patient Goals and CMS Choice Patient states their goals for this hospitalization and ongoing recovery are:: Want to get better CMS Medicare.gov Compare Post Acute Care list provided to:: Patient Choice offered to / list presented to : Patient, Spouse  Discharge Placement  [facility]                     Discharge Plan and Services     ED CM spoke with patient and spouse concerning home hospice services, patient and wife are agreeable with recommendations. Wife would like to know if patient should need residential hospice, could it be [facility] [place].   Patient lives [facility] referr [place].  Referral faxed to [facility].  Patient will need DME as well.  CM confirmed referral acceptance with on-call nurse start of services will be 03/23/2019 .Marland Kitchen                                Social Determinants of Health (SDOH) Interventions     Readmission Risk Interventions No flowsheet data found.    *-*!*

## 2019-03-22 NOTE — ED Triage Notes (Signed)
Pt c/o weakness. Stated he started taking Lasix for the last week. Patient has +4 edema to bilateral legs. Per EMS patient O2 sat were in 80% and was placed on O2 @2L . Pain 10/10. Patient has a history of Colon Cancer however is only taking Chemo and no medication.

## 2019-03-22 NOTE — Discharge Instructions (Addendum)
Hospice care has been arranged via our case manager.

## 2019-03-22 NOTE — ED Provider Notes (Signed)
[facility] EMERGENCY DEPARTMENT Provider Note   CSN: [id number] Arrival date & time: 03/22/19  1213     History Chief Complaint  Patient presents with  . Weakness    Brett [name] is a 59 y.o. male.  HPI  59 year old male with generalized weakness and worsening edema.  He has a history of metastatic adenocarcinoma.  He has had prior surgeries and chemotherapy.  Unfortunately he has had disease progression to the point that oncology feels that he is not a further candidate for active treatment and recommended hospice care.  He has been at home with his wife has become progressively weaker.  He has had increased edema.  Appetite waxes and wanes although generally not very good.  His weakness has progressed to the point where his wife physically has asked to lift him up to get up from seated position.  Past Medical History:  Diagnosis Date  . Barrett's esophagus 07/01/2013   Without dysplasia on biopsy 09/03/2012. Repeat EGD recommended 08/2015  . Bilateral cataracts 02/13/2017  . Carotid artery stenosis 07/01/2013   Requiring right sided stent   . Chronic pain syndrome 07/01/2013  . Closed head injury with brief loss of consciousness ([place]) 07/22/2010   Head trapped in a hydraulic device at work.  Fracture of orbital bones on right and brief loss of consciousness per report.  . 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 There was likely a strong psycho-social overlay in regard to the cognitive deficits in the form of mood disorder with psychotic features and mixed anxiety symptomatology. His primary tested cognitive deficits are in the areas of attention, executi  . Daily headache "since 07/2010"   constantly  . Degenerative joint disease of cervical spine 07/01/2013  . Dupuytren's contracture of both hands 04/08/2014  . Encounter for  antineoplastic chemotherapy 10/05/2015  . Encounter for antineoplastic immunotherapy 05/24/2016  . Erectile dysfunction associated with type 2 diabetes mellitus ([place]) 07/01/2013  . Fibromyalgia 07/01/2013  . Goals of care, counseling/discussion 03/28/2016  . History of blood transfusion 11/28/2015   "suppose to get his first today" (11/28/2015)  . Hyperlipidemia LDL goal < 100 07/01/2013  . Intractable hiccups 04/11/2016  . 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 Memory changes    "memory issues" from head injury  . Moderate protein-calorie malnutrition ([place]) 11/29/2015  . Osteoarthritis of right thumb 10/21/2014  . Peripheral vascular occlusive disease ([place]) 07/01/2013   Requiring 2 arterial stents above the left knee per report  . Pneumonia ~ 2006/2007  . Post traumatic stress disorder 07/01/2013  . Primary lung adenocarcinoma ([place]) dx'd 08/2015   "right lung"  . Severe major depression with psychotic features ([place]) 04/15/2011  . Tobacco abuse 07/01/2013  . Type 2 diabetes mellitus with vascular disease ([place]) 07/01/2013   Left lower extremity and right carotid stenting    Patient Active Problem List   Diagnosis Date Noted  . Cancer related pain 03/12/2019  . Encounter for antineoplastic chemotherapy 12/31/2018  . Pressure injury of skin 12/07/2018  . Hypotension 12/06/2018  . Peripheral edema   . Port-A-Cath in place 05/28/2018  . Diarrhea 05/28/2018  . Neuropathy 05/16/2017  . Bilateral cataracts 02/13/2017  . Constipation due to opioid therapy 07/05/2016  . Encounter for antineoplastic immunotherapy 05/24/2016  . Goals of care, counseling/discussion 03/28/2016  . 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 Moderate protein-calorie malnutrition ([place]) 11/29/2015  .  Primary adenocarcinoma of right lung ([place]) 08/25/2015  . Osteoarthritis of right thumb 10/21/2014  . Dupuytren's contracture of both hands 04/08/2014  . Healthcare maintenance 10/08/2013  . Post traumatic stress disorder  07/02/2013  . Type 2 diabetes mellitus with vascular disease ([place]) 07/01/2013  . Peripheral vascular occlusive disease ([place]) 07/01/2013  . 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 Erectile dysfunction associated with type 2 diabetes mellitus ([place]) 07/01/2013  . Hyperlipidemia 07/01/2013  . Tobacco abuse 07/01/2013  . Fibromyalgia 07/01/2013  . Chronic pain syndrome 07/01/2013  . Cognitive disorder 04/15/2011  . Closed head injury with brief loss of consciousness ([place]) 08/10/2010    Past Surgical History:  Procedure Laterality Date  . CAROTID STENT Right ?2014  . COLONOSCOPY N/A 11/30/2015   Procedure: COLONOSCOPY;  Surgeon: Teena Irani, MD;  Location[facility] ENDOSCOPY;  Service: Endoscopy;  Laterality: N/A;  . ESOPHAGOGASTRODUODENOSCOPY N/A 11/30/2015   Procedure: ESOPHAGOGASTRODUODENOSCOPY (EGD);  Surgeon: Teena Irani, MD;  Location: [facility] ENDOSCOPY;  Service: Endoscopy;  Laterality: N/A;  . ESOPHAGOGASTRODUODENOSCOPY (EGD) WITH PROPOFOL N/A 01/05/2016   Procedure: ESOPHAGOGASTRODUODENOSCOPY (EGD) WITH PROPOFOL;  Surgeon: Teena Irani, MD;  Location: [facility] ENDOSCOPY;  Service: Endoscopy;  Laterality: N/A;  . FEMORAL ARTERY STENT Left 05/2012; ~ 2015   Archie Endo 06/04/2012; Raechel Chute report  . FRACTURE SURGERY    . GIVENS CAPSULE STUDY N/A 12/22/2015   Procedure: GIVENS CAPSULE STUDY;  Surgeon: Wonda Horner, MD;  Location: [facility] ENDOSCOPY;  Service: Endoscopy;  Laterality: N/A;  . HARDWARE REMOVAL Right 11/15/2011   Removal of deep frontozygomatic orbital hardware/notes 11/15/2011  . HERNIA REPAIR  3154   Umbilical  . LAPAROSCOPY N/A 02/27/2016   Procedure: LAPAROSCOPY, LAPAROTOMY  WITH TWO SMALL BOWEL RESECTION;  Surgeon: Johnathan Hausen, MD;  Location: [facility] ORS;  Service: General;  Laterality: N/A;  . ORIF ORBITAL FRACTURE Right 08/15/2010    caught in a hydraulic machine; open reduction internal fixation of orbital rim fracture and open reduction of zygomatic arch fracture   Archie Endo 10/13/2009  . PORTACATH PLACEMENT Left 04/04/2016   Procedure: INSERTION PORT-A-CATH LEFT CHEST;  Surgeon: Melrose Nakayama, MD;  Location: [facility];  Service: Thoracic;  Laterality: Left;  Marland Kitchen VIDEO ASSISTED THORACOSCOPY (VATS)/ LOBECTOMY Right 10/18/2015   Procedure: VIDEO ASSISTED THORACOSCOPY (VATS)/ LOBECTOMY;  Surgeon: Melrose Nakayama, MD;  Location: [facility];  Service: Thoracic;  Laterality: Right;  Marland Kitchen VIDEO BRONCHOSCOPY Bilateral 09/21/2015   Procedure: VIDEO BRONCHOSCOPY WITH FLUORO;  Surgeon: Juanito Doom, MD;  Location: [facility] ENDOSCOPY;  Service: Cardiopulmonary;  Laterality: Bilateral;     Family History  Problem Relation Age of Onset  . Heart disease Mother   . Diabetes Mother   . Diabetes Father   . Heart attack Father   . Diabetes Sister   . Coronary artery disease Sister        s/p CABG  . Diabetes Brother   . Healthy Daughter   . Healthy Son   . Diabetes Sister   . Healthy Sister   . Healthy Sister   . Diabetes Brother   . Coronary artery disease Brother        s/p CABG  . Diabetes Brother   . Healthy Brother   . Healthy Brother   . Healthy Daughter    Social History   Tobacco Use  . Smoking status: Current Every Day Smoker    Packs/day: 1.00    Years: 35.00    Pack years: 35.00    Types: Cigarettes  Last attempt to quit: 10/18/2015    Years since quitting: 3.4  . Smokeless tobacco: Never Used  . Tobacco comment: .5PPD  Substance Use Topics  . Alcohol use: Yes    Alcohol/week: 0.0 standard drinks    Comment: Rarely.  . Drug use: No   Home Medications Prior to Admission medications   Medication Sig Start Date End Date Taking? Authorizing Provider  ACCU-CHEK SOFTCLIX LANCETS lancets Use to test blood glucose 1-2 times daily. Dx Code E11.59 10/03/17   [name], MD  aspirin EC 81 MG tablet Take 81 mg by mouth daily.     [provider]  atorvastatin (LIPITOR) 40 MG tablet TAKE 1 TABLET(40 MG) BY MOUTH DAILY 02/17/19   Sid Falcon, MD  Blood Glucose Monitoring Suppl (ACCU-CHEK AVIVA PLUS) w/Device KIT Use to test blood glucose 1-2 times daily. Dx Code E11.59 04/06/18   [name], MD  dronabinol (MARINOL) 2.5 MG capsule Take 1 capsule (2.5 mg total) by mouth 2 (two) times daily before a meal. 02/18/19   Heilingoetter, Cassandra L, PA-C  furosemide (LASIX) 20 MG tablet Please take 1 tablet twice daily for 1 week, then take 1 tablet once a day as needed for swelling 03/12/19   Heilingoetter, Cassandra L, PA-C  glipiZIDE (GLUCOTROL) 10 MG tablet Take 1 tablet (10 mg total) by mouth 2 (two) times daily before a meal. 12/08/17   [name], MD  glucose blood test strip USE TO TEST BLOOD GLUCOSE 1 TO 2 TIMES DAILY 03/30/18   [name], MD  insulin aspart (NOVOLOG) 100 UNIT/ML FlexPen Inject 1-9 units into the skin with your 2 biggest meals  based on the sliding scale provided to you. 12/10/18   Santos-Sanchez, Merlene Morse, MD  Insulin Glargine (LANTUS) 100 UNIT/ML Solostar Pen Inject 5 Units into the skin daily. 10/23/18   Sid Falcon, MD  Insulin Pen Needle (PEN NEEDLES) 32G X 4 MM MISC 1 pen by Does not apply route 2 (two) times daily. Dx Code E11.59 12/10/18   Welford Roche, MD  levothyroxine (SYNTHROID) 50 MCG tablet Take 1 tablet (50 mcg total) by mouth daily before breakfast. 12/18/18   Heilingoetter, Cassandra L, PA-C  morphine (MS CONTIN) 15 MG 12 hr tablet Take 1 tablet (15 mg total) by mouth every 12 (twelve) hours. 03/12/19   Curt Bears, MD  oxyCODONE-acetaminophen (PERCOCET) 10-325 MG tablet Take 1 tablet by mouth every 6 (six) hours as needed for pain. 11/13/18 11/13/19  Sid Falcon, MD  oxyCODONE-acetaminophen (PERCOCET) 10-325 MG tablet Take 1-2 tablets by mouth every 6 (six) hours as needed for pain. 03/18/19   Sid Falcon, MD  pantoprazole (PROTONIX) 40 MG tablet TAKE 1 TABLET(40 MG) BY MOUTH DAILY Patient taking differently: Take 40 mg by mouth daily.  11/27/18   Sid Falcon,  MD  potassium chloride SA (KLOR-CON) 20 MEQ tablet Take 1 tablet (20 mEq total) by mouth daily. 03/12/19   Heilingoetter, Cassandra L, PA-C  predniSONE (DELTASONE) 50 MG tablet Take one tablet 13 hours , one tablet 7 hours and one tablet 1 hours prior to CT scan. 03/16/19   Heilingoetter, Cassandra L, PA-C  prochlorperazine (COMPAZINE) 10 MG tablet Take 1 tablet (10 mg total) by mouth every 6 (six) hours as needed for nausea or vomiting. 12/29/18   Sid Falcon, MD  sitaGLIPtin (JANUVIA) 100 MG tablet Take 1 tablet (100 mg total) by mouth daily. 02/05/19   Sid Falcon, MD  predniSONE (DELTASONE) 50 MG  tablet TAKE 1 TABLET BY MOUTH AT 13 HOURS, 7 HOURS, AND 1 HOUR PRIOR TO SCAN 05/28/18   Heilingoetter, Cassandra L, PA-C    Allergies    Gabapentin, Lyrica [pregabalin], Jardiance [empagliflozin], Celebrex [celecoxib], and Contrast media [iodinated diagnostic agents]  Review of Systems   Review of Systems All systems reviewed and negative, other than as noted in HPI.  Physical Exam Updated Vital Signs BP 96/72   Pulse (!) 119   Resp 17   Ht 5' 6" (1.676 m)   Wt 52.2 kg   SpO2 95%   BMI 18.56 kg/m   Physical Exam Vitals and nursing note reviewed.  Constitutional:      Appearance: He is well-developed.     Comments: Chronically ill-appearing  HENT:     Head: Normocephalic and atraumatic.  Eyes:     General:        Right eye: No discharge.        Left eye: No discharge.     Conjunctiva/sclera: Conjunctivae normal.  Cardiovascular:     Rate and Rhythm: Normal rate and regular rhythm.     Heart sounds: Normal heart sounds. No murmur. No friction rub. No gallop.   Pulmonary:     Effort: Pulmonary effort is normal. No respiratory distress.     Breath sounds: Normal breath sounds.  Abdominal:     General: There is no distension.     Palpations: Abdomen is soft.     Tenderness: There is no abdominal tenderness.  Musculoskeletal:        General: No tenderness.     Cervical back:  Neck supple.     Right lower leg: Edema present.     Left lower leg: Edema present.  Skin:    General: Skin is warm and dry.  Neurological:     Mental Status: He is alert.     Sensory: No sensory deficit.    ED Results / Procedures / Treatments   Labs (all labs ordered are listed, but only abnormal results are displayed) Labs Reviewed  BASIC METABOLIC PANEL - Abnormal; Notable for the following components:      Result Value   Glucose, Bld 149 (*)    BUN 6 (*)    Creatinine, Ser 0.48 (*)    Calcium 7.6 (*)    All other components within normal limits  CBC - Abnormal; Notable for the following components:   RBC 4.07 (*)    Platelets 101 (*)    nRBC 0.3 (*)    All other components within normal limits  CBG MONITORING, ED - Abnormal; Notable for the following components:   Glucose-Capillary 130 (*)    All other components within normal limits  URINALYSIS, ROUTINE W REFLEX MICROSCOPIC    EKG EKG Interpretation  Date/Time:  Monday March 22 2019 12:55:32 EST Ventricular Rate:  107 PR Interval:    QRS Duration: 113 QT Interval:  351 QTC Calculation: 469 R Axis:   109 Text Interpretation: Sinus tachycardia IRBBB and LPFB Low voltage, precordial leads Artifact in lead(s) I II aVR aVL V1 V2 Confirmed by Virgel Manifold [phone]) on 03/22/2019 1:34:15 PM   Radiology No results found.  Procedures Procedures (including critical care time)  Medications Ordered in ED Medications  sodium chloride flush (NS) 0.9 % injection 3 mL (has no administration in time range)  furosemide (LASIX) injection 40 mg (has no administration in time range)    ED Course  I have reviewed the triage vital signs and the  nursing notes.  Pertinent labs & imaging results that were available during my care of the patient were reviewed by me and considered in my medical decision making (see chart for details).    MDM Rules/Calculators/A&P                      59 year old male with failure to thrive  in the setting of advanced metastatic cancer.  Unfortunately, he is not a candidate for continued medical care per oncology. Discussion with pt/wife. He is not interested in admission. Is interested in home hospice though. Social work consultation to initiate this process.   Final Clinical Impression(s) / ED Diagnoses Final diagnoses:  Metastatic malignant neoplasm, unspecified site [facility])  Peripheral edema    Rx / DC Orders ED Discharge Orders    None       Virgel Manifold, MD 03/22/19 1537

## 2019-03-30 ENCOUNTER — Inpatient Hospital Stay (HOSPITAL_COMMUNITY): Admission: RE | Admit: 2019-03-30 | Payer: Medicare HMO | Source: Ambulatory Visit

## 2019-04-01 ENCOUNTER — Ambulatory Visit: Payer: Medicare HMO | Admitting: Internal Medicine

## 2019-04-01 ENCOUNTER — Other Ambulatory Visit: Payer: Medicare HMO

## 2019-04-01 ENCOUNTER — Ambulatory Visit: Payer: Medicare HMO

## 2019-04-08 ENCOUNTER — Ambulatory Visit: Payer: Medicare HMO

## 2019-04-08 ENCOUNTER — Other Ambulatory Visit: Payer: Medicare HMO

## 2019-04-09 ENCOUNTER — Encounter: Payer: Medicare HMO | Admitting: Internal Medicine

## 2019-04-13 DIAGNOSIS — A419 Sepsis, unspecified organism: Secondary | ICD-10-CM | POA: Diagnosis not present

## 2019-04-13 DIAGNOSIS — E1165 Type 2 diabetes mellitus with hyperglycemia: Secondary | ICD-10-CM | POA: Diagnosis not present

## 2019-04-13 DIAGNOSIS — R404 Transient alteration of awareness: Secondary | ICD-10-CM | POA: Diagnosis not present

## 2019-04-13 DIAGNOSIS — G9341 Metabolic encephalopathy: Secondary | ICD-10-CM | POA: Diagnosis not present

## 2019-04-13 DIAGNOSIS — C7A1 Malignant poorly differentiated neuroendocrine tumors: Secondary | ICD-10-CM | POA: Diagnosis not present

## 2019-04-13 DIAGNOSIS — R6521 Severe sepsis with septic shock: Secondary | ICD-10-CM | POA: Diagnosis not present

## 2019-04-13 DIAGNOSIS — E161 Other hypoglycemia: Secondary | ICD-10-CM | POA: Diagnosis not present

## 2019-04-13 DIAGNOSIS — I959 Hypotension, unspecified: Secondary | ICD-10-CM | POA: Diagnosis not present

## 2019-04-13 DIAGNOSIS — E162 Hypoglycemia, unspecified: Secondary | ICD-10-CM | POA: Diagnosis not present

## 2019-04-13 DIAGNOSIS — C3490 Malignant neoplasm of unspecified part of unspecified bronchus or lung: Secondary | ICD-10-CM | POA: Diagnosis not present

## 2019-04-13 DIAGNOSIS — C7A8 Other malignant neuroendocrine tumors: Secondary | ICD-10-CM | POA: Diagnosis not present

## 2019-04-13 DIAGNOSIS — R4020 Unspecified coma: Secondary | ICD-10-CM | POA: Diagnosis not present

## 2019-04-13 DIAGNOSIS — J9600 Acute respiratory failure, unspecified whether with hypoxia or hypercapnia: Secondary | ICD-10-CM | POA: Diagnosis not present

## 2019-04-14 DIAGNOSIS — C3490 Malignant neoplasm of unspecified part of unspecified bronchus or lung: Secondary | ICD-10-CM | POA: Diagnosis not present

## 2019-04-14 DIAGNOSIS — Z515 Encounter for palliative care: Secondary | ICD-10-CM | POA: Diagnosis not present

## 2019-04-14 DIAGNOSIS — C7A8 Other malignant neuroendocrine tumors: Secondary | ICD-10-CM | POA: Diagnosis not present

## 2019-04-14 DIAGNOSIS — J189 Pneumonia, unspecified organism: Secondary | ICD-10-CM | POA: Diagnosis not present

## 2019-04-14 DIAGNOSIS — G9341 Metabolic encephalopathy: Secondary | ICD-10-CM | POA: Diagnosis not present

## 2019-04-14 DIAGNOSIS — R0600 Dyspnea, unspecified: Secondary | ICD-10-CM | POA: Diagnosis not present

## 2019-04-14 DIAGNOSIS — R64 Cachexia: Secondary | ICD-10-CM | POA: Diagnosis not present

## 2019-04-14 DIAGNOSIS — E11641 Type 2 diabetes mellitus with hypoglycemia with coma: Secondary | ICD-10-CM | POA: Diagnosis not present

## 2019-04-15 DIAGNOSIS — C3490 Malignant neoplasm of unspecified part of unspecified bronchus or lung: Secondary | ICD-10-CM | POA: Diagnosis not present

## 2019-04-15 DIAGNOSIS — G9341 Metabolic encephalopathy: Secondary | ICD-10-CM | POA: Diagnosis not present

## 2019-04-15 DIAGNOSIS — E43 Unspecified severe protein-calorie malnutrition: Secondary | ICD-10-CM | POA: Diagnosis not present

## 2019-04-15 DIAGNOSIS — C7A8 Other malignant neuroendocrine tumors: Secondary | ICD-10-CM | POA: Diagnosis not present

## 2019-04-16 DIAGNOSIS — G9341 Metabolic encephalopathy: Secondary | ICD-10-CM | POA: Diagnosis not present

## 2019-04-16 DIAGNOSIS — C3490 Malignant neoplasm of unspecified part of unspecified bronchus or lung: Secondary | ICD-10-CM | POA: Diagnosis not present

## 2019-04-16 DIAGNOSIS — C7A8 Other malignant neuroendocrine tumors: Secondary | ICD-10-CM | POA: Diagnosis not present

## 2019-04-16 DIAGNOSIS — E43 Unspecified severe protein-calorie malnutrition: Secondary | ICD-10-CM | POA: Diagnosis not present

## 2019-04-17 MED ORDER — OXYCODONE-ACETAMINOPHEN 10-325 MG PO TABS
1.00 | ORAL_TABLET | ORAL | Status: DC
Start: ? — End: 2019-04-17

## 2019-04-17 MED ORDER — MORPHINE SULFATE 2 MG/ML IJ SOLN
2.00 | INTRAMUSCULAR | Status: DC
Start: ? — End: 2019-04-17

## 2019-04-17 MED ORDER — ONDANSETRON HCL 4 MG/2ML IJ SOLN
4.00 | INTRAMUSCULAR | Status: DC
Start: ? — End: 2019-04-17

## 2019-04-17 MED ORDER — SCOPOLAMINE 1 MG/3DAYS TD PT72
1.00 | MEDICATED_PATCH | TRANSDERMAL | Status: DC
Start: 2019-04-17 — End: 2019-04-17

## 2019-04-17 MED ORDER — MORPHINE SULFATE ER 15 MG PO TBCR
30.00 | EXTENDED_RELEASE_TABLET | ORAL | Status: DC
Start: 2019-04-16 — End: 2019-04-17

## 2019-04-17 MED ORDER — BENZONATATE 100 MG PO CAPS
100.00 | ORAL_CAPSULE | ORAL | Status: DC
Start: ? — End: 2019-04-17

## 2019-04-17 MED ORDER — FUROSEMIDE 20 MG PO TABS
20.00 | ORAL_TABLET | ORAL | Status: DC
Start: 2019-04-17 — End: 2019-04-17

## 2019-04-22 DEATH — deceased

## 2019-09-28 NOTE — Telephone Encounter (Signed)
Error. Margarite Vessel M Arshad Oberholzer, RN
